# Patient Record
Sex: Female | Born: 1940 | Race: White | Hispanic: No | Smoking: Never smoker
Health system: Southern US, Community
[De-identification: ages and names within clinical notes are randomized; demographics above are authoritative.]

## PROBLEM LIST (undated history)

## (undated) DIAGNOSIS — C9000 Multiple myeloma not having achieved remission: Secondary | ICD-10-CM

## (undated) DIAGNOSIS — I739 Peripheral vascular disease, unspecified: Secondary | ICD-10-CM

## (undated) DIAGNOSIS — I219 Acute myocardial infarction, unspecified: Secondary | ICD-10-CM

## (undated) DIAGNOSIS — I10 Essential (primary) hypertension: Secondary | ICD-10-CM

## (undated) DIAGNOSIS — E7800 Pure hypercholesterolemia, unspecified: Secondary | ICD-10-CM

## (undated) DIAGNOSIS — I255 Ischemic cardiomyopathy: Secondary | ICD-10-CM

## (undated) DIAGNOSIS — R112 Nausea with vomiting, unspecified: Secondary | ICD-10-CM

## (undated) DIAGNOSIS — I509 Heart failure, unspecified: Secondary | ICD-10-CM

## (undated) DIAGNOSIS — E119 Type 2 diabetes mellitus without complications: Secondary | ICD-10-CM

## (undated) DIAGNOSIS — I2510 Atherosclerotic heart disease of native coronary artery without angina pectoris: Secondary | ICD-10-CM

## (undated) DIAGNOSIS — I639 Cerebral infarction, unspecified: Secondary | ICD-10-CM

## (undated) DIAGNOSIS — F419 Anxiety disorder, unspecified: Secondary | ICD-10-CM

## (undated) DIAGNOSIS — G8929 Other chronic pain: Secondary | ICD-10-CM

## (undated) DIAGNOSIS — Z9289 Personal history of other medical treatment: Secondary | ICD-10-CM

## (undated) DIAGNOSIS — M1990 Unspecified osteoarthritis, unspecified site: Secondary | ICD-10-CM

## (undated) DIAGNOSIS — I209 Angina pectoris, unspecified: Secondary | ICD-10-CM

## (undated) HISTORY — PX: CARPAL TUNNEL RELEASE: SHX101

## (undated) HISTORY — PX: CORONARY ANGIOPLASTY WITH STENT PLACEMENT: SHX49

## (undated) HISTORY — PX: VAGINAL HYSTERECTOMY: SUR661

## (undated) HISTORY — PX: RENAL ARTERY STENT: SHX2321

## (undated) HISTORY — PX: DILATION AND CURETTAGE OF UTERUS: SHX78

---

## 2005-01-11 HISTORY — PX: LAPAROSCOPIC GASTRIC BANDING: SHX1100

## 2005-04-05 ENCOUNTER — Other Ambulatory Visit: Payer: Self-pay

## 2005-04-05 ENCOUNTER — Inpatient Hospital Stay: Payer: Self-pay | Admitting: Internal Medicine

## 2005-08-11 ENCOUNTER — Ambulatory Visit (HOSPITAL_BASED_OUTPATIENT_CLINIC_OR_DEPARTMENT_OTHER): Admission: RE | Admit: 2005-08-11 | Discharge: 2005-08-11 | Payer: Self-pay | Admitting: Orthopedic Surgery

## 2006-06-27 ENCOUNTER — Ambulatory Visit: Payer: Self-pay

## 2009-12-08 ENCOUNTER — Ambulatory Visit: Payer: Self-pay | Admitting: Family Medicine

## 2010-03-05 ENCOUNTER — Encounter (HOSPITAL_BASED_OUTPATIENT_CLINIC_OR_DEPARTMENT_OTHER)
Admission: RE | Admit: 2010-03-05 | Discharge: 2010-03-05 | Disposition: A | Discharge status: Home / Self Care | Payer: Medicare Other | Source: Ambulatory Visit | Attending: Orthopedic Surgery | Admitting: Orthopedic Surgery

## 2010-03-05 DIAGNOSIS — Z01812 Encounter for preprocedural laboratory examination: Secondary | ICD-10-CM | POA: Insufficient documentation

## 2010-03-05 DIAGNOSIS — Z01810 Encounter for preprocedural cardiovascular examination: Secondary | ICD-10-CM | POA: Insufficient documentation

## 2010-03-05 LAB — POCT I-STAT, CHEM 8
BUN: 27 mg/dL — ABNORMAL HIGH (ref 6–23)
Chloride: 106 mEq/L (ref 96–112)
Creatinine, Ser: 1 mg/dL (ref 0.4–1.2)
Potassium: 4.9 mEq/L (ref 3.5–5.1)
Sodium: 135 mEq/L (ref 135–145)
TCO2: 22 mmol/L (ref 0–100)

## 2010-03-10 ENCOUNTER — Ambulatory Visit (HOSPITAL_BASED_OUTPATIENT_CLINIC_OR_DEPARTMENT_OTHER)
Admission: RE | Admit: 2010-03-10 | Discharge: 2010-03-10 | Disposition: A | Discharge status: Home / Self Care | Payer: Medicare Other | Source: Ambulatory Visit | Attending: Orthopedic Surgery | Admitting: Orthopedic Surgery

## 2010-03-10 DIAGNOSIS — G5600 Carpal tunnel syndrome, unspecified upper limb: Secondary | ICD-10-CM | POA: Insufficient documentation

## 2010-03-10 DIAGNOSIS — I10 Essential (primary) hypertension: Secondary | ICD-10-CM | POA: Insufficient documentation

## 2010-03-10 DIAGNOSIS — E119 Type 2 diabetes mellitus without complications: Secondary | ICD-10-CM | POA: Insufficient documentation

## 2010-03-10 DIAGNOSIS — M654 Radial styloid tenosynovitis [de Quervain]: Secondary | ICD-10-CM | POA: Insufficient documentation

## 2010-03-10 DIAGNOSIS — K219 Gastro-esophageal reflux disease without esophagitis: Secondary | ICD-10-CM | POA: Insufficient documentation

## 2010-03-10 LAB — GLUCOSE, CAPILLARY
Glucose-Capillary: 151 mg/dL — ABNORMAL HIGH (ref 70–99)
Glucose-Capillary: 202 mg/dL — ABNORMAL HIGH (ref 70–99)

## 2010-03-10 LAB — POCT HEMOGLOBIN-HEMACUE: Hemoglobin: 11.5 g/dL — ABNORMAL LOW (ref 12.0–15.0)

## 2010-07-31 NOTE — Op Note (Signed)
NAME:  [name], [name]                  ACCOUNT NO.:  [id number]  MEDICAL RECORD NO.:  [id number]           PATIENT TYPE:  LOCATION:                                 FACILITY:  PHYSICIAN:  Cindee Salt, M.D.            DATE OF BIRTH:  DATE OF PROCEDURE:  03/10/2010 DATE OF DISCHARGE:                              OPERATIVE REPORT   PREOPERATIVE DIAGNOSES:  Carpal tunnel syndrome, left hand; de Quervain tendonitis, left wrist.  POSTOPERATIVE DIAGNOSIS:  Carpal tunnel syndrome, left hand; de Quervain tendonitis, left wrist.  OPERATION:  Release of first dorsal compartment, left wrist; carpal tunnel release, left hand.  SURGEON:  Cindee Salt, MD  ANESTHESIA:  Axillary block.  ANESTHESIOLOGIST:  Dr. Jean Rosenthal.  HISTORY:  The patient is a 70 year old female with a history of carpal tunnel syndrome, de Quervain tendonitis neither of which have responded to conservative treatment.  She also has carpometacarpal arthritis.  She has elected not to have anything done to that at the present time.  Pre, peri, and postoperative course have been discussed along with risks and complications.  She is aware that there is no guarantee with surgery, possibility of infection, recurrence of injury to arteries, nerves, tendons, incomplete relief of symptoms, dystrophy.  Preoperative area of the patient is seen.  The extremity marked by both the patient and surgeon.  Antibiotic given.  PROCEDURE:  The patient is brought to the operating room where an axillary block was carried out without difficulty.  She was prepped using ChloraPrep in supine position, left arm free.  A 3-minute dry time was allowed.  Time-out taken to confirm the patient and procedure.  The limb was exsanguinated with an Esmarch bandage.  Tourniquet placed high on the arm was inflated to 250 mmHg.  A straight incision was made over the first dorsal compartment of her left wrist, carried down through subcutaneous tissue.  Bleeders were  electrocauterized with bipolar. Radial and ulnar nerve identified and protected.  Retractors placed. The first dorsal compartment was identified.  This was then incised on its most dorsal aspect.  The extensor brevis, pollicis brevis, abductor pollicis longus were each identified.  There was not a separate tunnel; however, there was a significant tenosynovitis with adhesions proximally on the abductor pollicis longus.  These were relieved.  No further lesions were identified.  The wound was irrigated and the skin closed with a subcuticular 5-0 Vicryl Rapide sutures.  Separate incision was then made longitudinally in the palm, carried down through subcutaneous tissue.  Bleeders were again electrocauterized with bipolar.  The palmar fascia split, superficial palmar arch identified.  The flexor tendon to the ring and little finger identified to the ulnar side of the median nerve.  The carpal retinaculum was incised with sharp dissection.  Right angle and Sewall retractor were placed between skin and forearm fascia. The fascia released for approximately a centimeter and half proximal to the wrist crease under direct vision.  Canal was explored.  Area of compression to the nerve was apparent.  No further lesions were identified.  The wound was  irrigated.  Skin closed with interrupted 5-0 Vicryl Rapide.  The surgery began while block was still somewhat immature.  Local infiltration with 0.25% Marcaine without epinephrine was given in each of the areas to be incised.  A sterile compressive dressing and splint was applied with fingers free, the thumb included. On deflation of the tourniquet, all fingers immediately pinked.  She was taken to the recovery room for observation in satisfactory condition. She will be discharged to home to return to the [facility] [place] in 1 week, on [address].          ______________________________ Cindee Salt, M.D.     GK/MEDQ  D:  03/10/2010  T:   03/10/2010  Job:  [id number]  Electronically Signed by Cindee Salt M.D. on 07/31/2010 09:00:03 AM

## 2010-11-25 ENCOUNTER — Ambulatory Visit: Payer: Self-pay | Admitting: Family Medicine

## 2011-01-12 HISTORY — PX: FEMORAL-POPLITEAL BYPASS GRAFT: SHX937

## 2011-03-30 DIAGNOSIS — I10 Essential (primary) hypertension: Secondary | ICD-10-CM | POA: Insufficient documentation

## 2011-03-30 DIAGNOSIS — E119 Type 2 diabetes mellitus without complications: Secondary | ICD-10-CM | POA: Insufficient documentation

## 2011-03-30 DIAGNOSIS — E785 Hyperlipidemia, unspecified: Secondary | ICD-10-CM | POA: Insufficient documentation

## 2011-04-26 DIAGNOSIS — K219 Gastro-esophageal reflux disease without esophagitis: Secondary | ICD-10-CM | POA: Insufficient documentation

## 2011-04-26 DIAGNOSIS — I2510 Atherosclerotic heart disease of native coronary artery without angina pectoris: Secondary | ICD-10-CM | POA: Insufficient documentation

## 2011-04-27 ENCOUNTER — Ambulatory Visit: Payer: Self-pay | Admitting: Psychiatry

## 2011-04-27 LAB — PLATELET FUNCTION ASSAY
COL/ADP PLT FXN SCRN: 97 Seconds (ref 0–100)
COL/EPI PLT FXN SCRN: >300 Seconds — ABNORMAL HIGH

## 2011-05-12 ENCOUNTER — Ambulatory Visit: Payer: Self-pay | Admitting: Family Medicine

## 2011-07-27 DIAGNOSIS — I739 Peripheral vascular disease, unspecified: Secondary | ICD-10-CM | POA: Insufficient documentation

## 2012-08-27 ENCOUNTER — Ambulatory Visit: Payer: Self-pay | Admitting: Emergency Medicine

## 2012-08-29 LAB — BETA STREP CULTURE(ARMC)

## 2013-05-15 DIAGNOSIS — I998 Other disorder of circulatory system: Secondary | ICD-10-CM | POA: Insufficient documentation

## 2013-05-15 DIAGNOSIS — I70229 Atherosclerosis of native arteries of extremities with rest pain, unspecified extremity: Secondary | ICD-10-CM | POA: Insufficient documentation

## 2013-07-16 DIAGNOSIS — C9000 Multiple myeloma not having achieved remission: Secondary | ICD-10-CM | POA: Insufficient documentation

## 2013-10-10 DIAGNOSIS — Z5111 Encounter for antineoplastic chemotherapy: Secondary | ICD-10-CM | POA: Insufficient documentation

## 2013-10-10 DIAGNOSIS — G629 Polyneuropathy, unspecified: Secondary | ICD-10-CM | POA: Insufficient documentation

## 2013-11-07 DIAGNOSIS — Z9884 Bariatric surgery status: Secondary | ICD-10-CM | POA: Insufficient documentation

## 2013-11-12 ENCOUNTER — Ambulatory Visit: Payer: Self-pay | Admitting: Oncology

## 2013-12-11 ENCOUNTER — Ambulatory Visit: Payer: Self-pay | Admitting: Oncology

## 2014-03-26 DIAGNOSIS — N390 Urinary tract infection, site not specified: Secondary | ICD-10-CM | POA: Insufficient documentation

## 2014-04-26 ENCOUNTER — Ambulatory Visit
Admit: 2014-04-26 | Disposition: A | Discharge status: Home / Self Care | Payer: Self-pay | Attending: Family Medicine | Admitting: Family Medicine

## 2015-04-08 ENCOUNTER — Other Ambulatory Visit: Payer: Self-pay | Admitting: Family Medicine

## 2015-04-08 ENCOUNTER — Ambulatory Visit
Admission: RE | Admit: 2015-04-08 | Discharge: 2015-04-08 | Disposition: A | Discharge status: Home / Self Care | Payer: Medicare Other | Source: Ambulatory Visit | Attending: Family Medicine | Admitting: Family Medicine

## 2015-04-08 DIAGNOSIS — M79621 Pain in right upper arm: Secondary | ICD-10-CM | POA: Diagnosis present

## 2015-04-08 DIAGNOSIS — M85821 Other specified disorders of bone density and structure, right upper arm: Secondary | ICD-10-CM | POA: Insufficient documentation

## 2015-05-12 ENCOUNTER — Other Ambulatory Visit: Payer: Self-pay | Admitting: Family Medicine

## 2015-05-12 ENCOUNTER — Ambulatory Visit
Admission: RE | Admit: 2015-05-12 | Discharge: 2015-05-12 | Disposition: A | Discharge status: Home / Self Care | Payer: Medicare Other | Source: Ambulatory Visit | Attending: Family Medicine | Admitting: Family Medicine

## 2015-05-12 DIAGNOSIS — M25562 Pain in left knee: Secondary | ICD-10-CM

## 2015-05-12 HISTORY — PX: CORONARY ANGIOPLASTY WITH STENT PLACEMENT: SHX49

## 2015-05-20 DIAGNOSIS — T07XXXA Unspecified multiple injuries, initial encounter: Secondary | ICD-10-CM | POA: Insufficient documentation

## 2015-05-20 DIAGNOSIS — M79605 Pain in left leg: Secondary | ICD-10-CM | POA: Insufficient documentation

## 2015-06-11 DIAGNOSIS — N179 Acute kidney failure, unspecified: Secondary | ICD-10-CM | POA: Insufficient documentation

## 2015-06-11 DIAGNOSIS — E875 Hyperkalemia: Secondary | ICD-10-CM | POA: Insufficient documentation

## 2015-06-14 DIAGNOSIS — M25562 Pain in left knee: Secondary | ICD-10-CM | POA: Insufficient documentation

## 2015-06-16 DIAGNOSIS — R600 Localized edema: Secondary | ICD-10-CM | POA: Insufficient documentation

## 2015-06-17 ENCOUNTER — Encounter
Admission: RE | Admit: 2015-06-17 | Discharge: 2015-06-17 | Disposition: A | Discharge status: Home / Self Care | Payer: Medicare Other | Source: Ambulatory Visit | Attending: Internal Medicine | Admitting: Internal Medicine

## 2015-06-18 LAB — GLUCOSE, CAPILLARY
Glucose-Capillary: 171 mg/dL — ABNORMAL HIGH (ref 65–99)
Glucose-Capillary: 144 mg/dL — ABNORMAL HIGH (ref 65–99)
Glucose-Capillary: 149 mg/dL — ABNORMAL HIGH (ref 65–99)

## 2015-06-19 LAB — GLUCOSE, CAPILLARY
GLUCOSE-CAPILLARY: 99 mg/dL (ref 65–99)
GLUCOSE-CAPILLARY: 137 mg/dL — AB (ref 65–99)
GLUCOSE-CAPILLARY: 269 mg/dL — AB (ref 65–99)
Glucose-Capillary: 250 mg/dL — ABNORMAL HIGH (ref 65–99)

## 2015-06-20 LAB — GLUCOSE, CAPILLARY
GLUCOSE-CAPILLARY: 201 mg/dL — AB (ref 65–99)
Glucose-Capillary: 120 mg/dL — ABNORMAL HIGH (ref 65–99)
Glucose-Capillary: 168 mg/dL — ABNORMAL HIGH (ref 65–99)
Glucose-Capillary: 185 mg/dL — ABNORMAL HIGH (ref 65–99)

## 2015-06-21 LAB — GLUCOSE, CAPILLARY
GLUCOSE-CAPILLARY: 128 mg/dL — AB (ref 65–99)
GLUCOSE-CAPILLARY: 149 mg/dL — AB (ref 65–99)
Glucose-Capillary: 148 mg/dL — ABNORMAL HIGH (ref 65–99)
Glucose-Capillary: 118 mg/dL — ABNORMAL HIGH (ref 65–99)

## 2015-06-22 LAB — GLUCOSE, CAPILLARY
GLUCOSE-CAPILLARY: 122 mg/dL — AB (ref 65–99)
Glucose-Capillary: 216 mg/dL — ABNORMAL HIGH (ref 65–99)
Glucose-Capillary: 256 mg/dL — ABNORMAL HIGH (ref 65–99)
Glucose-Capillary: 236 mg/dL — ABNORMAL HIGH (ref 65–99)

## 2015-06-23 LAB — GLUCOSE, CAPILLARY
GLUCOSE-CAPILLARY: 121 mg/dL — AB (ref 65–99)
GLUCOSE-CAPILLARY: 99 mg/dL (ref 65–99)
GLUCOSE-CAPILLARY: 72 mg/dL (ref 65–99)
Glucose-Capillary: 196 mg/dL — ABNORMAL HIGH (ref 65–99)
Glucose-Capillary: 62 mg/dL — ABNORMAL LOW (ref 65–99)
Glucose-Capillary: 114 mg/dL — ABNORMAL HIGH (ref 65–99)

## 2015-06-24 LAB — GLUCOSE, CAPILLARY
GLUCOSE-CAPILLARY: 104 mg/dL — AB (ref 65–99)
GLUCOSE-CAPILLARY: 132 mg/dL — AB (ref 65–99)
Glucose-Capillary: 123 mg/dL — ABNORMAL HIGH (ref 65–99)
Glucose-Capillary: 142 mg/dL — ABNORMAL HIGH (ref 65–99)

## 2015-06-25 LAB — GLUCOSE, CAPILLARY
GLUCOSE-CAPILLARY: 112 mg/dL — AB (ref 65–99)
GLUCOSE-CAPILLARY: 109 mg/dL — AB (ref 65–99)
Glucose-Capillary: 93 mg/dL (ref 65–99)

## 2015-06-26 LAB — GLUCOSE, CAPILLARY
GLUCOSE-CAPILLARY: 57 mg/dL — AB (ref 65–99)
GLUCOSE-CAPILLARY: 97 mg/dL (ref 65–99)
GLUCOSE-CAPILLARY: 72 mg/dL (ref 65–99)
Glucose-Capillary: 65 mg/dL (ref 65–99)
Glucose-Capillary: 75 mg/dL (ref 65–99)

## 2015-06-27 LAB — GLUCOSE, CAPILLARY
GLUCOSE-CAPILLARY: 281 mg/dL — AB (ref 65–99)
Glucose-Capillary: 59 mg/dL — ABNORMAL LOW (ref 65–99)
Glucose-Capillary: 85 mg/dL (ref 65–99)
Glucose-Capillary: 227 mg/dL — ABNORMAL HIGH (ref 65–99)

## 2015-06-28 LAB — GLUCOSE, CAPILLARY
GLUCOSE-CAPILLARY: 134 mg/dL — AB (ref 65–99)
GLUCOSE-CAPILLARY: 161 mg/dL — AB (ref 65–99)
Glucose-Capillary: 104 mg/dL — ABNORMAL HIGH (ref 65–99)
Glucose-Capillary: 99 mg/dL (ref 65–99)

## 2015-06-29 LAB — GLUCOSE, CAPILLARY
GLUCOSE-CAPILLARY: 103 mg/dL — AB (ref 65–99)
GLUCOSE-CAPILLARY: 150 mg/dL — AB (ref 65–99)
Glucose-Capillary: 120 mg/dL — ABNORMAL HIGH (ref 65–99)
Glucose-Capillary: 146 mg/dL — ABNORMAL HIGH (ref 65–99)

## 2015-06-30 LAB — GLUCOSE, CAPILLARY
GLUCOSE-CAPILLARY: 154 mg/dL — AB (ref 65–99)
GLUCOSE-CAPILLARY: 138 mg/dL — AB (ref 65–99)
Glucose-Capillary: 95 mg/dL (ref 65–99)

## 2015-07-01 LAB — GLUCOSE, CAPILLARY
Glucose-Capillary: 105 mg/dL — ABNORMAL HIGH (ref 65–99)
Glucose-Capillary: 168 mg/dL — ABNORMAL HIGH (ref 65–99)
Glucose-Capillary: 156 mg/dL — ABNORMAL HIGH (ref 65–99)

## 2015-07-02 LAB — GLUCOSE, CAPILLARY
GLUCOSE-CAPILLARY: 121 mg/dL — AB (ref 65–99)
GLUCOSE-CAPILLARY: 153 mg/dL — AB (ref 65–99)
Glucose-Capillary: 163 mg/dL — ABNORMAL HIGH (ref 65–99)
Glucose-Capillary: 76 mg/dL (ref 65–99)

## 2015-07-03 LAB — GLUCOSE, CAPILLARY
GLUCOSE-CAPILLARY: 97 mg/dL (ref 65–99)
Glucose-Capillary: 125 mg/dL — ABNORMAL HIGH (ref 65–99)
Glucose-Capillary: 133 mg/dL — ABNORMAL HIGH (ref 65–99)
Glucose-Capillary: 145 mg/dL — ABNORMAL HIGH (ref 65–99)

## 2015-07-04 LAB — GLUCOSE, CAPILLARY
GLUCOSE-CAPILLARY: 111 mg/dL — AB (ref 65–99)
GLUCOSE-CAPILLARY: 124 mg/dL — AB (ref 65–99)
Glucose-Capillary: 75 mg/dL (ref 65–99)
Glucose-Capillary: 148 mg/dL — ABNORMAL HIGH (ref 65–99)

## 2015-07-05 LAB — GLUCOSE, CAPILLARY
GLUCOSE-CAPILLARY: 128 mg/dL — AB (ref 65–99)
Glucose-Capillary: 94 mg/dL (ref 65–99)
Glucose-Capillary: 63 mg/dL — ABNORMAL LOW (ref 65–99)
Glucose-Capillary: 100 mg/dL — ABNORMAL HIGH (ref 65–99)

## 2015-07-06 LAB — GLUCOSE, CAPILLARY
GLUCOSE-CAPILLARY: 93 mg/dL (ref 65–99)
GLUCOSE-CAPILLARY: 103 mg/dL — AB (ref 65–99)
GLUCOSE-CAPILLARY: 137 mg/dL — AB (ref 65–99)
GLUCOSE-CAPILLARY: 180 mg/dL — AB (ref 65–99)

## 2015-07-07 LAB — GLUCOSE, CAPILLARY
Glucose-Capillary: 127 mg/dL — ABNORMAL HIGH (ref 65–99)
Glucose-Capillary: 235 mg/dL — ABNORMAL HIGH (ref 65–99)
Glucose-Capillary: 186 mg/dL — ABNORMAL HIGH (ref 65–99)

## 2015-07-08 LAB — GLUCOSE, CAPILLARY
GLUCOSE-CAPILLARY: 226 mg/dL — AB (ref 65–99)
Glucose-Capillary: 123 mg/dL — ABNORMAL HIGH (ref 65–99)
Glucose-Capillary: 170 mg/dL — ABNORMAL HIGH (ref 65–99)
Glucose-Capillary: 214 mg/dL — ABNORMAL HIGH (ref 65–99)

## 2015-07-09 LAB — GLUCOSE, CAPILLARY
GLUCOSE-CAPILLARY: 167 mg/dL — AB (ref 65–99)
Glucose-Capillary: 139 mg/dL — ABNORMAL HIGH (ref 65–99)
Glucose-Capillary: 163 mg/dL — ABNORMAL HIGH (ref 65–99)
Glucose-Capillary: 192 mg/dL — ABNORMAL HIGH (ref 65–99)

## 2015-07-10 LAB — GLUCOSE, CAPILLARY
GLUCOSE-CAPILLARY: 134 mg/dL — AB (ref 65–99)
GLUCOSE-CAPILLARY: 198 mg/dL — AB (ref 65–99)
Glucose-Capillary: 143 mg/dL — ABNORMAL HIGH (ref 65–99)
Glucose-Capillary: 146 mg/dL — ABNORMAL HIGH (ref 65–99)

## 2015-07-11 LAB — GLUCOSE, CAPILLARY
GLUCOSE-CAPILLARY: 130 mg/dL — AB (ref 65–99)
GLUCOSE-CAPILLARY: 157 mg/dL — AB (ref 65–99)
GLUCOSE-CAPILLARY: 142 mg/dL — AB (ref 65–99)
GLUCOSE-CAPILLARY: 208 mg/dL — AB (ref 65–99)

## 2015-07-12 ENCOUNTER — Encounter
Admission: RE | Admit: 2015-07-12 | Discharge: 2015-07-12 | Disposition: A | Discharge status: Home / Self Care | Payer: Medicare Other | Source: Ambulatory Visit | Attending: Internal Medicine | Admitting: Internal Medicine

## 2015-07-12 LAB — GLUCOSE, CAPILLARY
GLUCOSE-CAPILLARY: 168 mg/dL — AB (ref 65–99)
GLUCOSE-CAPILLARY: 162 mg/dL — AB (ref 65–99)
Glucose-Capillary: 148 mg/dL — ABNORMAL HIGH (ref 65–99)
Glucose-Capillary: 233 mg/dL — ABNORMAL HIGH (ref 65–99)

## 2015-07-13 LAB — GLUCOSE, CAPILLARY
GLUCOSE-CAPILLARY: 145 mg/dL — AB (ref 65–99)
GLUCOSE-CAPILLARY: 210 mg/dL — AB (ref 65–99)
Glucose-Capillary: 147 mg/dL — ABNORMAL HIGH (ref 65–99)
Glucose-Capillary: 197 mg/dL — ABNORMAL HIGH (ref 65–99)

## 2015-07-14 LAB — GLUCOSE, CAPILLARY
Glucose-Capillary: 148 mg/dL — ABNORMAL HIGH (ref 65–99)
Glucose-Capillary: 148 mg/dL — ABNORMAL HIGH (ref 65–99)
Glucose-Capillary: 236 mg/dL — ABNORMAL HIGH (ref 65–99)

## 2015-07-15 LAB — GLUCOSE, CAPILLARY
GLUCOSE-CAPILLARY: 138 mg/dL — AB (ref 65–99)
GLUCOSE-CAPILLARY: 154 mg/dL — AB (ref 65–99)
GLUCOSE-CAPILLARY: 153 mg/dL — AB (ref 65–99)
GLUCOSE-CAPILLARY: 169 mg/dL — AB (ref 65–99)

## 2015-07-16 LAB — GLUCOSE, CAPILLARY
GLUCOSE-CAPILLARY: 152 mg/dL — AB (ref 65–99)
Glucose-Capillary: 143 mg/dL — ABNORMAL HIGH (ref 65–99)

## 2015-07-26 ENCOUNTER — Emergency Department: Payer: Medicare Other

## 2015-07-26 ENCOUNTER — Encounter: Payer: Self-pay | Admitting: Emergency Medicine

## 2015-07-26 ENCOUNTER — Emergency Department
Admission: EM | Admit: 2015-07-26 | Discharge: 2015-07-26 | Disposition: A | Discharge status: Home / Self Care | Payer: Medicare Other | Attending: Emergency Medicine | Admitting: Emergency Medicine

## 2015-07-26 DIAGNOSIS — E119 Type 2 diabetes mellitus without complications: Secondary | ICD-10-CM | POA: Insufficient documentation

## 2015-07-26 DIAGNOSIS — S0003XA Contusion of scalp, initial encounter: Secondary | ICD-10-CM | POA: Insufficient documentation

## 2015-07-26 DIAGNOSIS — S43401A Unspecified sprain of right shoulder joint, initial encounter: Secondary | ICD-10-CM | POA: Insufficient documentation

## 2015-07-26 DIAGNOSIS — Z7984 Long term (current) use of oral hypoglycemic drugs: Secondary | ICD-10-CM | POA: Diagnosis not present

## 2015-07-26 DIAGNOSIS — S0990XA Unspecified injury of head, initial encounter: Secondary | ICD-10-CM

## 2015-07-26 DIAGNOSIS — Y9289 Other specified places as the place of occurrence of the external cause: Secondary | ICD-10-CM | POA: Insufficient documentation

## 2015-07-26 DIAGNOSIS — W1809XA Striking against other object with subsequent fall, initial encounter: Secondary | ICD-10-CM | POA: Diagnosis not present

## 2015-07-26 DIAGNOSIS — I252 Old myocardial infarction: Secondary | ICD-10-CM | POA: Diagnosis not present

## 2015-07-26 DIAGNOSIS — Z79899 Other long term (current) drug therapy: Secondary | ICD-10-CM | POA: Insufficient documentation

## 2015-07-26 DIAGNOSIS — Y999 Unspecified external cause status: Secondary | ICD-10-CM | POA: Insufficient documentation

## 2015-07-26 DIAGNOSIS — Y9389 Activity, other specified: Secondary | ICD-10-CM | POA: Diagnosis not present

## 2015-07-26 DIAGNOSIS — T148XXA Other injury of unspecified body region, initial encounter: Secondary | ICD-10-CM

## 2015-07-26 DIAGNOSIS — Z7982 Long term (current) use of aspirin: Secondary | ICD-10-CM | POA: Insufficient documentation

## 2015-07-26 HISTORY — DX: Multiple myeloma not having achieved remission: C90.00

## 2015-07-26 HISTORY — DX: Acute myocardial infarction, unspecified: I21.9

## 2015-07-26 LAB — CBC WITH DIFFERENTIAL/PLATELET
BASOS PCT: 1 %
Basophils Absolute: 0.1 10*3/uL (ref 0–0.1)
EOS ABS: 0.2 10*3/uL (ref 0–0.7)
EOS PCT: 2 %
HCT: 35.4 % (ref 35.0–47.0)
HEMOGLOBIN: 11.6 g/dL — AB (ref 12.0–16.0)
LYMPHS ABS: 5.6 10*3/uL — AB (ref 1.0–3.6)
Lymphocytes Relative: 47 %
MCH: 26.6 pg (ref 26.0–34.0)
MCHC: 32.8 g/dL (ref 32.0–36.0)
MCV: 81.1 fL (ref 80.0–100.0)
MONOS PCT: 4 %
Monocytes Absolute: 0.5 10*3/uL (ref 0.2–0.9)
NEUTROS PCT: 46 %
Neutro Abs: 5.5 10*3/uL (ref 1.4–6.5)
PLATELETS: 240 10*3/uL (ref 150–440)
RBC: 4.37 MIL/uL (ref 3.80–5.20)
RDW: 16.7 % — AB (ref 11.5–14.5)
WBC: 12 10*3/uL — ABNORMAL HIGH (ref 3.6–11.0)

## 2015-07-26 LAB — BASIC METABOLIC PANEL
ANION GAP: 11 (ref 5–15)
BUN: 22 mg/dL — AB (ref 6–20)
CALCIUM: 9.8 mg/dL (ref 8.9–10.3)
CO2: 24 mmol/L (ref 22–32)
CREATININE: 1.24 mg/dL — AB (ref 0.44–1.00)
Chloride: 99 mmol/L — ABNORMAL LOW (ref 101–111)
GFR calc Af Amer: 48 mL/min — ABNORMAL LOW (ref >60)
GFR, EST NON AFRICAN AMERICAN: 42 mL/min — AB (ref >60)
GLUCOSE: 212 mg/dL — AB (ref 65–99)
Potassium: 4.5 mmol/L (ref 3.5–5.1)
Sodium: 134 mmol/L — ABNORMAL LOW (ref 135–145)

## 2015-07-26 LAB — PROTIME-INR
INR: 0.97
PROTHROMBIN TIME: 13.1 s (ref 11.4–15.0)
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
## 2015-08-12 DIAGNOSIS — I219 Acute myocardial infarction, unspecified: Secondary | ICD-10-CM

## 2015-08-12 HISTORY — DX: Acute myocardial infarction, unspecified: I21.9

## 2015-11-05 DIAGNOSIS — N390 Urinary tract infection, site not specified: Secondary | ICD-10-CM | POA: Insufficient documentation

## 2015-11-11 DIAGNOSIS — Z951 Presence of aortocoronary bypass graft: Secondary | ICD-10-CM | POA: Insufficient documentation

## 2015-11-11 HISTORY — PX: CORONARY ARTERY BYPASS GRAFT: SHX141

## 2015-11-12 DIAGNOSIS — G8929 Other chronic pain: Secondary | ICD-10-CM | POA: Insufficient documentation

## 2015-11-12 DIAGNOSIS — G8918 Other acute postprocedural pain: Secondary | ICD-10-CM | POA: Insufficient documentation

## 2015-11-13 DIAGNOSIS — D62 Acute posthemorrhagic anemia: Secondary | ICD-10-CM | POA: Insufficient documentation

## 2015-11-13 DIAGNOSIS — D72829 Elevated white blood cell count, unspecified: Secondary | ICD-10-CM | POA: Insufficient documentation

## 2015-11-29 ENCOUNTER — Emergency Department: Payer: Medicare Other

## 2015-11-29 ENCOUNTER — Emergency Department
Admission: EM | Admit: 2015-11-29 | Discharge: 2015-11-29 | Disposition: A | Discharge status: Home / Self Care | Payer: Medicare Other | Attending: Emergency Medicine | Admitting: Emergency Medicine

## 2015-11-29 ENCOUNTER — Encounter: Payer: Self-pay | Admitting: Emergency Medicine

## 2015-11-29 DIAGNOSIS — I252 Old myocardial infarction: Secondary | ICD-10-CM | POA: Diagnosis not present

## 2015-11-29 DIAGNOSIS — Z7982 Long term (current) use of aspirin: Secondary | ICD-10-CM | POA: Diagnosis not present

## 2015-11-29 DIAGNOSIS — Z7984 Long term (current) use of oral hypoglycemic drugs: Secondary | ICD-10-CM | POA: Diagnosis not present

## 2015-11-29 DIAGNOSIS — I10 Essential (primary) hypertension: Secondary | ICD-10-CM | POA: Insufficient documentation

## 2015-11-29 DIAGNOSIS — Z951 Presence of aortocoronary bypass graft: Secondary | ICD-10-CM | POA: Diagnosis not present

## 2015-11-29 DIAGNOSIS — Z79899 Other long term (current) drug therapy: Secondary | ICD-10-CM | POA: Diagnosis not present

## 2015-11-29 DIAGNOSIS — J189 Pneumonia, unspecified organism: Secondary | ICD-10-CM | POA: Diagnosis not present

## 2015-11-29 DIAGNOSIS — E119 Type 2 diabetes mellitus without complications: Secondary | ICD-10-CM | POA: Insufficient documentation

## 2015-11-29 DIAGNOSIS — R0602 Shortness of breath: Secondary | ICD-10-CM | POA: Diagnosis present

## 2015-11-29 DIAGNOSIS — R0789 Other chest pain: Secondary | ICD-10-CM

## 2015-11-29 HISTORY — DX: Essential (primary) hypertension: I10

## 2015-11-29 LAB — BASIC METABOLIC PANEL
ANION GAP: 8 (ref 5–15)
BUN: 23 mg/dL — ABNORMAL HIGH (ref 6–20)
CHLORIDE: 103 mmol/L (ref 101–111)
CO2: 23 mmol/L (ref 22–32)
Calcium: 8.9 mg/dL (ref 8.9–10.3)
Creatinine, Ser: 1.1 mg/dL — ABNORMAL HIGH (ref 0.44–1.00)
GFR calc non Af Amer: 48 mL/min — ABNORMAL LOW (ref >60)
GFR, EST AFRICAN AMERICAN: 56 mL/min — AB (ref >60)
GLUCOSE: 166 mg/dL — AB (ref 65–99)
Potassium: 4.3 mmol/L (ref 3.5–5.1)
Sodium: 134 mmol/L — ABNORMAL LOW (ref 135–145)

## 2015-11-29 LAB — TROPONIN I: Troponin I: <0.03 ng/mL (ref <0.03)

## 2015-11-29 MED ORDER — LEVOFLOXACIN 500 MG PO TABS: 500.0000 mg | ORAL_TABLET | Freq: Every day | ORAL | 0 refills | Status: AC

## 2015-11-29 MED ORDER — LEVOFLOXACIN 750 MG PO TABS
750.0000 mg | ORAL_TABLET | Freq: Once | ORAL | Status: AC
  Administered 2015-11-29: 750 mg via ORAL
  Filled 2015-11-29: qty 1

## 2015-11-29 NOTE — ED Notes (Signed)
Pt back from xr and ambulated to the toilet

## 2015-11-29 NOTE — ED Notes (Signed)
Pt upset that she has been "stuck five times" for iv. (these were ultrasound iv attempts d/t pt needing a size 20 iv for rule out pe study). Blood was obtained by Ena Dawley with his attempt but per lab the purple top needs redrawn. Pt does not want [place] to attempt another iv. Pt is on a blood thinner - dr made aware.

## 2015-11-29 NOTE — ED Provider Notes (Signed)
[facility] Emergency Department Provider Note   ____________________________________________   I have reviewed the triage vital signs and the nursing notes.   HISTORY  Chief Complaint Shortness of Breath; Chest Pain; and Nausea   History limited by: Not Limited   HPI [name] is a 75 y.o. female, who underwent CABG at [place] 18 days ago, who presents to the emergency department today because of concern for shortness of breath and chest pain. The shortness of breath started this morning and has gotten progressively worse. She actually felt like cold air when she went outside made it better. She did feel some right sided chest pain with this shortness of breath. No fevers. No cough.     Past Medical History:  Diagnosis Date  . Diabetes mellitus without complication ([place])   . Hypertension   . MI (myocardial infarction)   . Multiple myeloma ([facility])     There are no active problems to display for this patient.   Past Surgical History:  Procedure Laterality Date  . ABDOMINAL HYSTERECTOMY    . CARDIAC SURGERY    . CORONARY ARTERY BYPASS GRAFT    . LAPAROSCOPIC GASTRIC BANDING      Prior to Admission medications   Medication Sig Start Date End Date Taking? Authorizing Provider  aspirin 81 MG tablet Take 81 mg by mouth daily.    Historical Provider, MD  Cholecalciferol (VITAMIN D3) 50000 units CAPS Take 1 capsule by mouth 2 (two) times a week.    Historical Provider, MD  clopidogrel (PLAVIX) 75 MG tablet Take 75 mg by mouth daily.    Historical Provider, MD  ezetimibe (ZETIA) 10 MG tablet Take 10 mg by mouth daily.    Historical Provider, MD  fentaNYL (DURAGESIC - DOSED MCG/HR) 25 MCG/HR patch Place 25 mcg onto the skin every 3 (three) days.    Historical Provider, MD  gabapentin (NEURONTIN) 300 MG capsule Take 300 mg by mouth at bedtime.    Historical Provider, MD  HYDROmorphone (DILAUDID) 2 MG tablet Take 2 mg by mouth every 4 (four)  hours as needed for severe pain.    Historical Provider, MD  isosorbide mononitrate (IMDUR) 30 MG 24 hr tablet Take 30 mg by mouth daily.    Historical Provider, MD  lisinopril (PRINIVIL,ZESTRIL) 5 MG tablet Take 5 mg by mouth daily.    Historical Provider, MD  metFORMIN (GLUCOPHAGE) 500 MG tablet Take by mouth 2 (two) times daily with a meal.    Historical Provider, MD  metoprolol (LOPRESSOR) 50 MG tablet Take 50 mg by mouth daily.    Historical Provider, MD  nitroGLYCERIN (NITROSTAT) 0.4 MG SL tablet Place 0.4 mg under the tongue every 5 (five) minutes as needed for chest pain.    Historical Provider, MD  ondansetron (ZOFRAN-ODT) 4 MG disintegrating tablet Take 4 mg by mouth every 8 (eight) hours as needed for nausea or vomiting.    Historical Provider, MD    Allergies Amoxicillin; Codeine; and Statins  History reviewed. No pertinent family history.  Social History Social History  Substance Use Topics  . Smoking status: Never Smoker  . Smokeless tobacco: Never Used  . Alcohol use No    Review of Systems  Constitutional: Negative for fever. Cardiovascular: Positive for right sided chest pain. Respiratory: Positive for shortness of breath. Gastrointestinal: Negative for abdominal pain, vomiting and diarrhea. Genitourinary: Negative for dysuria. Musculoskeletal: Negative for back pain. Skin: Negative for rash. Neurological: Negative for headaches, focal weakness or numbness.  10-point ROS otherwise negative.  ____________________________________________   PHYSICAL EXAM:  VITAL SIGNS: ED Triage Vitals  Enc Vitals Group     BP 11/29/15 1909 (!) 192/73     Pulse Rate 11/29/15 1909 72     Resp 11/29/15 1909 20     Temp 11/29/15 1909 98.2 F (36.8 C)     Temp Source 11/29/15 1909 Oral     SpO2 11/29/15 1909 99 %     Weight 11/29/15 1910 155 lb (70.3 kg)     Height 11/29/15 1910 _0  (1.575 m)     Head Circumference --      Peak Flow --      Pain Score 11/29/15 1910 5    Constitutional: Alert and oriented. Well appearing and in no distress. Eyes: Conjunctivae are normal. Normal extraocular movements. ENT   Head: Normocephalic and atraumatic.   Nose: No congestion/rhinnorhea.   Mouth/Throat: Mucous membranes are moist.   Neck: No stridor. Hematological/Lymphatic/Immunilogical: No cervical lymphadenopathy. Cardiovascular: Normal rate, regular rhythm.  No murmurs, rubs, or gallops.  Respiratory: Normal respiratory effort without tachypnea nor retractions. Breath sounds are clear and equal bilaterally. No wheezes/rales/rhonchi. Gastrointestinal: Soft and nontender. No distention.  Genitourinary: Deferred Musculoskeletal: Normal range of motion in all extremities. No lower extremity edema. Neurologic:  Normal speech and language. No gross focal neurologic deficits are appreciated.  Skin:  Skin is warm, dry and intact. No rash noted. Psychiatric: Mood and affect are normal. Speech and behavior are normal. Patient exhibits appropriate insight and judgment.  ____________________________________________    LABS (pertinent positives/negatives)  Labs Reviewed  BASIC METABOLIC PANEL - Abnormal; Notable for the following:       Result Value   Sodium 134 (*)    Glucose, Bld 166 (*)    BUN 23 (*)    Creatinine, Ser 1.10 (*)    GFR calc non Af Amer 48 (*)    GFR calc Af Amer 56 (*)    All other components within normal limits  TROPONIN I     ____________________________________________   EKG  I, Nance Pear, attending physician, personally viewed and interpreted this EKG  EKG Time: 1903 Rate: 72 Rhythm: normal sinus rhythm Axis: left axis deviation Intervals: qtc 442 QRS: narrow, q waves III, aVF, V1, V2, V3 ST changes: no st elevation Impression: abnormal ekg   ____________________________________________    RADIOLOGY  CXR pending  CT PE  pending  _________________   PROCEDURES  Procedures  ____________________________________________   INITIAL IMPRESSION / ASSESSMENT AND PLAN / ED COURSE  Pertinent labs & imaging results that were available during my care of the patient were reviewed by me and considered in my medical decision making (see chart for details).  Patient with chest pain, shortness of breath who underwent cabg roughly 18 days ago. On exam patient appears well, lungs are clear. Given recent operation would have concern for possible PE. Would also consider PNA. Think ACS less likely given location of pain. No acute findings on EKG. Will check blood work, CXR and CTA PE.  Clinical Course    Staff unable to obtain an IV, with US guidance. Had a discussion with patient about further attempts. Did discuss with patient that it would be of great use to be able to obtain a CT PE. Patient would like to discuss further with her family. Awaiting CXR at time of signout.  ____________________________________________   FINAL CLINICAL IMPRESSION(S) / ED DIAGNOSES  Shortness of breath  Note: This dictation was prepared  with Sales executive. Any transcriptional errors that result from this process are unintentional    Nance Pear, MD 11/30/15 1101

## 2015-11-29 NOTE — ED Notes (Signed)
Attempt at IV access by ED paramedic Russell unsuccessful; pt tolerated well

## 2015-11-29 NOTE — ED Notes (Signed)
Pt states she normally has to have ultrasound iv's. Pt will need a #20 iv for r/o pe study. Larene Beach rn will see pt for ultrasound iv.

## 2015-11-29 NOTE — ED Provider Notes (Signed)
Patient received in signout from Dr. Archie Balboa. Patient being evaluated for chest pain status post CABG associated with some shortness of breath. EKG and troponin not consistent with ACS. Chest x-ray suggestive of pneumonia. Plan was to have a CT PE study performed a evaluate for any pulmonary embolism. Multiple attempts for IV access were obtained and the patient is refusing further evaluation and management. Chest x-ray does show evidence of effusion and probable pneumonia. Patient is requesting discharge home. She understands that leaving prior to completion of her workup could result and failure to diagnose life-threatening processes.  I will discharge the patient with oral Levaquin to treat pneumonia and is encouraged patient to return for further evaluation and management.   Merlyn Lot, MD 11/29/15 2222

## 2015-11-29 NOTE — ED Notes (Signed)
Aaron Edelman rn with pt for attempt [place] iv. Pt stating the the pain of him trying to insert the iv is "going to give her a heart attack". Aaron Edelman stopped the iv attempt.

## 2015-11-29 NOTE — ED Notes (Signed)
Pt requested that I attempt an iv not using ultrasound - unable to establish the iv. [name] in room to assist. She allowed him to attempt as well with no success. Dr. aware.

## 2015-11-29 NOTE — ED Notes (Signed)
Pt states she is having some right sided chest pain and feeling sob even when sitting. Pt had open heart surgery on 10/31 at [facility] and states she had 2 blockages. Vss. bp was high on arrival but decreased when relaxing in room.

## 2015-11-29 NOTE — ED Triage Notes (Signed)
Pt reports open heart surgery at [facility] on 11/11/15; pt says this am she woke with nausea, shortness of breath and right sided chest pain; pt reports sharp pain to right breast area; pain is worse with deep inspiration; denies cough and fever; pt says shortness of breath is when she's sitting up; says she can go walk outside in the cold air and breathe better; pt's skin is dry but pale; alert and oriented x 3; talking in complete coherent sentences; pt adds if she has to be transferred anywhere she does not want to go back to [facility]; talking about this made her tearful in triage

## 2015-12-15 ENCOUNTER — Encounter: Payer: Medicare Other | Attending: Internal Medicine | Admitting: *Deleted

## 2015-12-15 VITALS — Ht 62.1 in | Wt 166.0 lb

## 2015-12-15 DIAGNOSIS — I779 Disorder of arteries and arterioles, unspecified: Secondary | ICD-10-CM | POA: Insufficient documentation

## 2015-12-15 DIAGNOSIS — Z9889 Other specified postprocedural states: Secondary | ICD-10-CM | POA: Diagnosis present

## 2015-12-15 DIAGNOSIS — Z48812 Encounter for surgical aftercare following surgery on the circulatory system: Secondary | ICD-10-CM | POA: Diagnosis present

## 2015-12-15 DIAGNOSIS — Z951 Presence of aortocoronary bypass graft: Secondary | ICD-10-CM | POA: Insufficient documentation

## 2015-12-15 NOTE — Progress Notes (Signed)
Cardiac Individual Treatment Plan  Patient Details  Name: [name] MRN: [id number] Date of Birth: [birth year] Referring Provider:   Flowsheet Row Cardiac Rehab from 12/15/2015 in [facility] Cardiac and Pulmonary Rehab  Referring Provider  Harle Stanford MD      Initial Encounter Date:  Flowsheet Row Cardiac Rehab from 12/15/2015 in [facility] Cardiac and Pulmonary Rehab  Date  12/15/15  Referring Provider  Harle Stanford MD      Visit Diagnosis: S/P CABG x 2  Patient's Home Medications on Admission:  Current Outpatient Prescriptions:  .  aspirin 81 MG tablet, Take 81 mg by mouth daily., Disp: , Rfl:  .  Cholecalciferol (VITAMIN D3) 50000 units CAPS, Take 1 capsule by mouth 2 (two) times a week., Disp: , Rfl:  .  clopidogrel (PLAVIX) 75 MG tablet, Take 75 mg by mouth daily., Disp: , Rfl:  .  ezetimibe (ZETIA) 10 MG tablet, Take 10 mg by mouth daily., Disp: , Rfl:  .  fentaNYL (DURAGESIC - DOSED MCG/HR) 25 MCG/HR patch, Place 25 mcg onto the skin every 3 (three) days., Disp: , Rfl:  .  gabapentin (NEURONTIN) 300 MG capsule, Take 300 mg by mouth at bedtime., Disp: , Rfl:  .  HYDROmorphone (DILAUDID) 2 MG tablet, Take 2 mg by mouth every 4 (four) hours as needed for severe pain., Disp: , Rfl:  .  metFORMIN (GLUCOPHAGE) 500 MG tablet, Take by mouth 2 (two) times daily with a meal., Disp: , Rfl:  .  metoprolol (LOPRESSOR) 50 MG tablet, Take 50 mg by mouth daily., Disp: , Rfl:  .  ondansetron (ZOFRAN-ODT) 4 MG disintegrating tablet, Take 4 mg by mouth every 8 (eight) hours as needed for nausea or vomiting., Disp: , Rfl:  .  isosorbide mononitrate (IMDUR) 30 MG 24 hr tablet, Take 30 mg by mouth daily., Disp: , Rfl:  .  lisinopril (PRINIVIL,ZESTRIL) 5 MG tablet, Take 5 mg by mouth daily., Disp: , Rfl:  .  nitroGLYCERIN (NITROSTAT) 0.4 MG SL tablet, Place 0.4 mg under the tongue every 5 (five) minutes as needed for chest pain., Disp: , Rfl:   Past Medical History: Past Medical History:   Diagnosis Date  . Diabetes mellitus without complication ([place])   . Hypertension   . MI (myocardial infarction)   . Multiple myeloma ([facility])     Tobacco Use: History  Smoking Status  . Never Smoker  Smokeless Tobacco  . Never Used    Labs: Recent Review Flowsheet Data    Labs for ITP Cardiac and Pulmonary Rehab Latest Ref Rng & Units 03/05/2010   TCO2 0 - 100 mmol/L 22       Exercise Target Goals: Date: 12/15/15  Exercise Program Goal: Individual exercise prescription set with THRR, safety & activity barriers. Participant demonstrates ability to understand and report RPE using BORG scale, to self-measure pulse accurately, and to acknowledge the importance of the exercise prescription.  Exercise Prescription Goal: Starting with aerobic activity 30 plus minutes a day, 3 days per week for initial exercise prescription. Provide home exercise prescription and guidelines that participant acknowledges understanding prior to discharge.  Activity Barriers & Risk Stratification:     Activity Barriers & Cardiac Risk Stratification - 12/15/15 1432      Activity Barriers & Cardiac Risk Stratification   Activity Barriers Arthritis  Right shoulder,left knee arthritis very painful. myeloma causing pain in legs sometimes sever   Cardiac Risk Stratification High      6 Minute Walk:  6 Minute Walk    Row Name 12/15/15 1504         6 Minute Walk   Phase Initial     Distance 500 feet     Walk Time 6 minutes     # of Rest Breaks 0     MPH 0.95     METS 1.7     RPE 13     Perceived Dyspnea  3     VO2 Peak 3.72     Symptoms Yes (comment)     Comments SOB, knee pain 6/10     Resting HR 58 bpm     Resting BP 130/66     Max Ex. HR 102 bpm     Max Ex. BP 130/64     2 Minute Post BP 124/56        Initial Exercise Prescription:     Initial Exercise Prescription - 12/15/15 1500      Date of Initial Exercise RX and Referring Provider   Date 12/15/15   Referring  Provider Harle Stanford MD     NuStep   Level 1   Watts --  60-80 spm   Minutes 15   METs 1.7     Arm Ergometer   Level 1   Watts --  25-35 rpm   Minutes 15   METs 1.7     Track   Laps 15   Minutes 15   METs 1.7     Prescription Details   Frequency (times per week) 2   Duration Progress to 45 minutes of aerobic exercise without signs/symptoms of physical distress     Intensity   THRR 40-80% of Max Heartrate 93-128   Ratings of Perceived Exertion 11-13   Perceived Dyspnea 0-4     Progression   Progression Continue to progress workloads to maintain intensity without signs/symptoms of physical distress.     Resistance Training   Training Prescription Yes   Weight 1 lbs   Reps 10-12      Perform Capillary Blood Glucose checks as needed.  Exercise Prescription Changes:     Exercise Prescription Changes    Row Name 12/15/15 1500             Exercise Review   Progression -  walk test results         Response to Exercise   Blood Pressure (Admit) 130/66       Blood Pressure (Exercise) 130/64       Blood Pressure (Exit) 124/56       Heart Rate (Admit) 58 bpm       Heart Rate (Exercise) 91 bpm       Heart Rate (Exit) 57 bpm       Oxygen Saturation (Admit) 93 %       Oxygen Saturation (Exercise) 100 %       Rating of Perceived Exertion (Exercise) 13       Symptoms SOB, knee pain 6/10          Exercise Comments:     Exercise Comments    Row Name 12/15/15 1508           Exercise Comments Biggest exercise goal is to increase her stamina to get back to doing things and able to go out with her friends.          Discharge Exercise Prescription (Final Exercise Prescription Changes):     Exercise Prescription Changes - 12/15/15 1500      Exercise  Review   Progression --  walk test results     Response to Exercise   Blood Pressure (Admit) 130/66   Blood Pressure (Exercise) 130/64   Blood Pressure (Exit) 124/56   Heart Rate (Admit) 58 bpm    Heart Rate (Exercise) 91 bpm   Heart Rate (Exit) 57 bpm   Oxygen Saturation (Admit) 93 %   Oxygen Saturation (Exercise) 100 %   Rating of Perceived Exertion (Exercise) 13   Symptoms SOB, knee pain 6/10      Nutrition:  Target Goals: Understanding of nutrition guidelines, daily intake of sodium <1570m, cholesterol <2034m calories 30% from fat and 7% or less from saturated fats, daily to have 5 or more servings of fruits and vegetables.  Biometrics:     Pre Biometrics - 12/15/15 1509      Pre Biometrics   Height 5' 2.1" (1.577 m)   Weight 166 lb (75.3 kg)   Waist Circumference 35 inches   Hip Circumference 44.5 inches   Waist to Hip Ratio 0.79 %   BMI (Calculated) 30.3   Single Leg Stand 0 seconds       Nutrition Therapy Plan and Nutrition Goals:     Nutrition Therapy & Goals - 12/15/15 1420      Intervention Plan   Intervention Prescribe, educate and counsel regarding individualized specific dietary modifications aiming towards targeted core components such as weight, hypertension, lipid management, diabetes, heart failure and other comorbidities.   Expected Outcomes Short Term Goal: Understand basic principles of dietary content, such as calories, fat, sodium, cholesterol and nutrients.;Short Term Goal: A plan has been developed with personal nutrition goals set during dietitian appointment.;Long Term Goal: Adherence to prescribed nutrition plan.      Nutrition Discharge: Rate Your Plate Scores:     Nutrition Assessments - 12/15/15 1420      Rate Your Plate Scores   Pre Score 60   Pre Score % 66.6 %      Nutrition Goals Re-Evaluation:   Psychosocial: Target Goals: Acknowledge presence or absence of depression, maximize coping skills, provide positive support system. Participant is able to verbalize types and ability to use techniques and skills needed for reducing stress and depression.  Initial Review & Psychosocial Screening:     Initial Psych Review &  Screening - 12/15/15 1425      Initial Review   Current issues with Current Sleep Concerns  Sleep concerns since surgery     Family Dynamics   Good Support System? --  Son,daughter and friends     Barriers   Psychosocial barriers to participate in program There are no identifiable barriers or psychosocial needs.;The patient should benefit from training in stress management and relaxation.     Screening Interventions   Interventions Encouraged to exercise      Quality of Life Scores:     Quality of Life - 12/15/15 1428      Quality of Life Scores   Health/Function Pre 13.53 %   Socioeconomic Pre 18.6 %   Psych/Spiritual Pre 21 %   Family Pre 15.3 %   GLOBAL Pre 16.08 %      PHQ-9: Recent Review Flowsheet Data    [facility]   Decreased Interest 0   Down, Depressed, Hopeless 0   PHQ - 2 Score 0   Altered sleeping 3    Tired, decreased energy 2    Change in appetite 0   Feeling bad or failure about yourself  0  Trouble concentrating 0   Moving slowly or fidgety/restless 0   Suicidal thoughts 0   PHQ-9 Score 5      Psychosocial Evaluation and Intervention:   Psychosocial Re-Evaluation:   Vocational Rehabilitation: Provide vocational rehab assistance to qualifying candidates.   Vocational Rehab Evaluation & Intervention:     Vocational Rehab - 12/15/15 1443      Initial Vocational Rehab Evaluation & Intervention   Assessment shows need for Vocational Rehabilitation No      Education: Education Goals: Education classes will be provided on a weekly basis, covering required topics. Participant will state understanding/return demonstration of topics presented.  Learning Barriers/Preferences:     Learning Barriers/Preferences - 12/15/15 1441      Learning Barriers/Preferences   Learning Barriers None   Learning Preferences Verbal Instruction      Education Topics: General Nutrition Guidelines/Fats and Fiber: -Group  instruction provided by verbal, written material, models and posters to present the general guidelines for heart healthy nutrition. Gives an explanation and review of dietary fats and fiber.   Controlling Sodium/Reading Food Labels: -Group verbal and written material supporting the discussion of sodium use in heart healthy nutrition. Review and explanation with models, verbal and written materials for utilization of the food label.   Exercise Physiology & Risk Factors: - Group verbal and written instruction with models to review the exercise physiology of the cardiovascular system and associated critical values. Details cardiovascular disease risk factors and the goals associated with each risk factor.   Aerobic Exercise & Resistance Training: - Gives group verbal and written discussion on the health impact of inactivity. On the components of aerobic and resistive training programs and the benefits of this training and how to safely progress through these programs.   Flexibility, Balance, General Exercise Guidelines: - Provides group verbal and written instruction on the benefits of flexibility and balance training programs. Provides general exercise guidelines with specific guidelines to those with heart or lung disease. Demonstration and skill practice provided.   Stress Management: - Provides group verbal and written instruction about the health risks of elevated stress, cause of high stress, and healthy ways to reduce stress.   Depression: - Provides group verbal and written instruction on the correlation between heart/lung disease and depressed mood, treatment options, and the stigmas associated with seeking treatment.   Anatomy & Physiology of the Heart: - Group verbal and written instruction and models provide basic cardiac anatomy and physiology, with the coronary electrical and arterial systems. Review of: AMI, Angina, Valve disease, Heart Failure, Cardiac Arrhythmia, Pacemakers,  and the ICD.   Cardiac Procedures: - Group verbal and written instruction and models to describe the testing methods done to diagnose heart disease. Reviews the outcomes of the test results. Describes the treatment choices: Medical Management, Angioplasty, or Coronary Bypass Surgery.   Cardiac Medications: - Group verbal and written instruction to review commonly prescribed medications for heart disease. Reviews the medication, class of the drug, and side effects. Includes the steps to properly store meds and maintain the prescription regimen.   Go Sex-Intimacy & Heart Disease, Get SMART - Goal Setting: - Group verbal and written instruction through game format to discuss heart disease and the return to sexual intimacy. Provides group verbal and written material to discuss and apply goal setting through the application of the S.M.A.R.T. Method.   Other Matters of the Heart: - Provides group verbal, written materials and models to describe Heart Failure, Angina, Valve Disease, and Diabetes in the realm of  heart disease. Includes description of the disease process and treatment options available to the cardiac patient.   Exercise & Equipment Safety: - Individual verbal instruction and demonstration of equipment use and safety with use of the equipment. Flowsheet Row Cardiac Rehab from 12/15/2015 in [facility] Cardiac and Pulmonary Rehab  Date  12/15/15  Educator  SB  Instruction Review Code  2- meets goals/outcomes      Infection Prevention: - Provides verbal and written material to individual with discussion of infection control including proper hand washing and proper equipment cleaning during exercise session. Flowsheet Row Cardiac Rehab from 12/15/2015 in [facility] Cardiac and Pulmonary Rehab  Date  12/15/15  Educator  SB  Instruction Review Code  2- meets goals/outcomes      Falls Prevention: - Provides verbal and written material to individual with discussion of falls prevention and  safety. Flowsheet Row Cardiac Rehab from 12/15/2015 in [facility] Cardiac and Pulmonary Rehab  Date  12/15/15  Educator  SB  Instruction Review Code  2- meets goals/outcomes      Diabetes: - Individual verbal and written instruction to review signs/symptoms of diabetes, desired ranges of glucose level fasting, after meals and with exercise. Advice that pre and post exercise glucose checks will be done for 3 sessions at entry of program. Flowsheet Row Cardiac Rehab from 12/15/2015 in [facility] Cardiac and Pulmonary Rehab  Date  12/15/15  Educator  SB  Instruction Review Code  2- meets goals/outcomes       Knowledge Questionnaire Score:     Knowledge Questionnaire Score - 12/15/15 1442      Knowledge Questionnaire Score   Pre Score 18/28      Core Components/Risk Factors/Patient Goals at Admission:     Personal Goals and Risk Factors at Admission - 12/15/15 1443      Core Components/Risk Factors/Patient Goals on Admission    Weight Management Obesity;Yes   Intervention Weight Management: Develop a combined nutrition and exercise program designed to reach desired caloric intake, while maintaining appropriate intake of nutrient and fiber, sodium and fats, and appropriate energy expenditure required for the weight goal.;Weight Management: Provide education and appropriate resources to help participant work on and attain dietary goals.;Weight Management/Obesity: Establish reasonable short term and long term weight goals.   Admit Weight 166 lb (75.3 kg)   Goal Weight: Short Term 163 lb (73.9 kg)   Goal Weight: Long Term 140 lb (63.5 kg)   Expected Outcomes Short Term: Continue to assess and modify interventions until short term weight is achieved;Long Term: Adherence to nutrition and physical activity/exercise program aimed toward attainment of established weight goal;Weight Loss: Understanding of general recommendations for a balanced deficit meal plan, which promotes 1-2 lb weight loss per week  and includes a negative energy balance of [phone] kcal/d   Sedentary Yes   Intervention Develop an individualized exercise prescription for aerobic and resistive training based on initial evaluation findings, risk stratification, comorbidities and participant's personal goals.;Provide advice, education, support and counseling about physical activity/exercise needs.   Expected Outcomes Achievement of increased cardiorespiratory fitness and enhanced flexibility, muscular endurance and strength shown through measurements of functional capacity and personal statement of participant.   Increase Strength and Stamina Yes   Intervention Provide advice, education, support and counseling about physical activity/exercise needs.;Develop an individualized exercise prescription for aerobic and resistive training based on initial evaluation findings, risk stratification, comorbidities and participant's personal goals.   Expected Outcomes Achievement of increased cardiorespiratory fitness and enhanced flexibility, muscular endurance and strength shown  through measurements of functional capacity and personal statement of participant.   Diabetes Yes   Intervention Provide education about signs/symptoms and action to take for hypo/hyperglycemia.;Provide education about proper nutrition, including hydration, and aerobic/resistive exercise prescription along with prescribed medications to achieve blood glucose in normal ranges: Fasting glucose 65-99 mg/dL   Expected Outcomes Short Term: Participant verbalizes understanding of the signs/symptoms and immediate care of hyper/hypoglycemia, proper foot care and importance of medication, aerobic/resistive exercise and nutrition plan for blood glucose control.;Long Term: Attainment of HbA1C < 7%.   Hypertension Yes   Intervention Provide education on lifestyle modifcations including regular physical activity/exercise, weight management, moderate sodium restriction and increased  consumption of fresh fruit, vegetables, and low fat dairy, alcohol moderation, and smoking cessation.;Monitor prescription use compliance.   Expected Outcomes Short Term: Continued assessment and intervention until BP is < 140/64m HG in hypertensive participants. < 130/836mHG in hypertensive participants with diabetes, heart failure or chronic kidney disease.;Long Term: Maintenance of blood pressure at goal levels.   Lipids Yes   Intervention Provide education and support for participant on nutrition & aerobic/resistive exercise along with prescribed medications to achieve LDL <7081mHDL >30m63m Expected Outcomes Short Term: Participant states understanding of desired cholesterol values and is compliant with medications prescribed. Participant is following exercise prescription and nutrition guidelines.;Long Term: Cholesterol controlled with medications as prescribed, with individualized exercise RX and with personalized nutrition plan. Value goals: LDL < 70mg20mL > 40 mg.      Core Components/Risk Factors/Patient Goals Review:    Core Components/Risk Factors/Patient Goals at Discharge (Final Review):    ITP Comments:     ITP Comments    Row Name 12/15/15 1416           ITP Comments Medical review completed with initial ITP created. Diagnosis documentation can be found Care Everywhere [facility] post op visit 11/27/2015          Comments: Initial ITP

## 2015-12-15 NOTE — Patient Instructions (Signed)
Patient Instructions  Patient Details  Name: [name] MRN: [id number] Date of Birth: 11/09/1940 Referring Provider:  Vergia Alcon  Below are the personal goals you chose as well as exercise and nutrition goals. Our goal is to help you keep on track towards obtaining and maintaining your goals. We will be discussing your progress on these goals with you throughout the program.  Initial Exercise Prescription:     Initial Exercise Prescription - 12/15/15 1500      Date of Initial Exercise RX and Referring Provider   Date 12/15/15   Referring Provider Harle Stanford MD     NuStep   Level 1   Watts --  60-80 spm   Minutes 15   METs 1.7     Arm Ergometer   Level 1   Watts --  25-35 rpm   Minutes 15   METs 1.7     Track   Laps 15   Minutes 15   METs 1.7     Prescription Details   Frequency (times per week) 2   Duration Progress to 45 minutes of aerobic exercise without signs/symptoms of physical distress     Intensity   THRR 40-80% of Max Heartrate 93-128   Ratings of Perceived Exertion 11-13   Perceived Dyspnea 0-4     Progression   Progression Continue to progress workloads to maintain intensity without signs/symptoms of physical distress.     Resistance Training   Training Prescription Yes   Weight 1 lbs   Reps 10-12      Exercise Goals: Frequency: Be able to perform aerobic exercise three times per week working toward 3-5 days per week.  Intensity: Work with a perceived exertion of 11 (fairly light) - 15 (hard) as tolerated. Follow your new exercise prescription and watch for changes in prescription as you progress with the program. Changes will be reviewed with you when they are made.  Duration: You should be able to do 30 minutes of continuous aerobic exercise in addition to a 5 minute warm-up and a 5 minute cool-down routine.  Nutrition Goals: Your personal nutrition goals will be established when you do your nutrition analysis with the  dietician.  The following are nutrition guidelines to follow: Cholesterol < 200mg /day Sodium < 1500mg /day Fiber: Women over 50 yrs - 21 grams per day  Personal Goals:     Personal Goals and Risk Factors at Admission - 12/15/15 1443      Core Components/Risk Factors/Patient Goals on Admission    Weight Management Obesity;Yes   Intervention Weight Management: Develop a combined nutrition and exercise program designed to reach desired caloric intake, while maintaining appropriate intake of nutrient and fiber, sodium and fats, and appropriate energy expenditure required for the weight goal.;Weight Management: Provide education and appropriate resources to help participant work on and attain dietary goals.;Weight Management/Obesity: Establish reasonable short term and long term weight goals.   Admit Weight 166 lb (75.3 kg)   Goal Weight: Short Term 163 lb (73.9 kg)   Goal Weight: Long Term 140 lb (63.5 kg)   Expected Outcomes Short Term: Continue to assess and modify interventions until short term weight is achieved;Long Term: Adherence to nutrition and physical activity/exercise program aimed toward attainment of established weight goal;Weight Loss: Understanding of general recommendations for a balanced deficit meal plan, which promotes 1-2 lb weight loss per week and includes a negative energy balance of [phone] kcal/d   Sedentary Yes   Intervention Develop an individualized exercise prescription for  aerobic and resistive training based on initial evaluation findings, risk stratification, comorbidities and participant's personal goals.;Provide advice, education, support and counseling about physical activity/exercise needs.   Expected Outcomes Achievement of increased cardiorespiratory fitness and enhanced flexibility, muscular endurance and strength shown through measurements of functional capacity and personal statement of participant.   Increase Strength and Stamina Yes   Intervention  Provide advice, education, support and counseling about physical activity/exercise needs.;Develop an individualized exercise prescription for aerobic and resistive training based on initial evaluation findings, risk stratification, comorbidities and participant's personal goals.   Expected Outcomes Achievement of increased cardiorespiratory fitness and enhanced flexibility, muscular endurance and strength shown through measurements of functional capacity and personal statement of participant.   Diabetes Yes   Intervention Provide education about signs/symptoms and action to take for hypo/hyperglycemia.;Provide education about proper nutrition, including hydration, and aerobic/resistive exercise prescription along with prescribed medications to achieve blood glucose in normal ranges: Fasting glucose 65-99 mg/dL   Expected Outcomes Short Term: Participant verbalizes understanding of the signs/symptoms and immediate care of hyper/hypoglycemia, proper foot care and importance of medication, aerobic/resistive exercise and nutrition plan for blood glucose control.;Long Term: Attainment of HbA1C < 7%.   Hypertension Yes   Intervention Provide education on lifestyle modifcations including regular physical activity/exercise, weight management, moderate sodium restriction and increased consumption of fresh fruit, vegetables, and low fat dairy, alcohol moderation, and smoking cessation.;Monitor prescription use compliance.   Expected Outcomes Short Term: Continued assessment and intervention until BP is < 140/30mm HG in hypertensive participants. < 130/63mm HG in hypertensive participants with diabetes, heart failure or chronic kidney disease.;Long Term: Maintenance of blood pressure at goal levels.   Lipids Yes   Intervention Provide education and support for participant on nutrition & aerobic/resistive exercise along with prescribed medications to achieve LDL 70mg , HDL >40mg .   Expected Outcomes Short Term:  Participant states understanding of desired cholesterol values and is compliant with medications prescribed. Participant is following exercise prescription and nutrition guidelines.;Long Term: Cholesterol controlled with medications as prescribed, with individualized exercise RX and with personalized nutrition plan. Value goals: LDL < 70mg , HDL > 40 mg.      Tobacco Use Initial Evaluation: History  Smoking Status  . Never Smoker  Smokeless Tobacco  . Never Used    Copy of goals given to participant.

## 2015-12-17 ENCOUNTER — Encounter: Payer: Self-pay | Admitting: *Deleted

## 2015-12-17 DIAGNOSIS — Z951 Presence of aortocoronary bypass graft: Secondary | ICD-10-CM

## 2015-12-17 NOTE — Progress Notes (Signed)
Cardiac Individual Treatment Plan  Patient Details  Name: [name] MRN: [id number] Date of Birth: 15-Mar-1940 Referring Provider:   Flowsheet Row Cardiac Rehab from 12/15/2015 in [facility] Cardiac and Pulmonary Rehab  Referring Provider  Harle Stanford MD      Initial Encounter Date:  Flowsheet Row Cardiac Rehab from 12/15/2015 in [facility] Cardiac and Pulmonary Rehab  Date  12/15/15  Referring Provider  Harle Stanford MD      Visit Diagnosis: S/P CABG x 2  Patient's Home Medications on Admission:  Current Outpatient Prescriptions:  .  aspirin 81 MG tablet, Take 81 mg by mouth daily., Disp: , Rfl:  .  Cholecalciferol (VITAMIN D3) 50000 units CAPS, Take 1 capsule by mouth 2 (two) times a week., Disp: , Rfl:  .  clopidogrel (PLAVIX) 75 MG tablet, Take 75 mg by mouth daily., Disp: , Rfl:  .  ezetimibe (ZETIA) 10 MG tablet, Take 10 mg by mouth daily., Disp: , Rfl:  .  fentaNYL (DURAGESIC - DOSED MCG/HR) 25 MCG/HR patch, Place 25 mcg onto the skin every 3 (three) days., Disp: , Rfl:  .  gabapentin (NEURONTIN) 300 MG capsule, Take 300 mg by mouth at bedtime., Disp: , Rfl:  .  HYDROmorphone (DILAUDID) 2 MG tablet, Take 2 mg by mouth every 4 (four) hours as needed for severe pain., Disp: , Rfl:  .  isosorbide mononitrate (IMDUR) 30 MG 24 hr tablet, Take 30 mg by mouth daily., Disp: , Rfl:  .  lisinopril (PRINIVIL,ZESTRIL) 5 MG tablet, Take 5 mg by mouth daily., Disp: , Rfl:  .  metFORMIN (GLUCOPHAGE) 500 MG tablet, Take by mouth 2 (two) times daily with a meal., Disp: , Rfl:  .  metoprolol (LOPRESSOR) 50 MG tablet, Take 50 mg by mouth daily., Disp: , Rfl:  .  nitroGLYCERIN (NITROSTAT) 0.4 MG SL tablet, Place 0.4 mg under the tongue every 5 (five) minutes as needed for chest pain., Disp: , Rfl:  .  ondansetron (ZOFRAN-ODT) 4 MG disintegrating tablet, Take 4 mg by mouth every 8 (eight) hours as needed for nausea or vomiting., Disp: , Rfl:   Past Medical History: Past Medical History:   Diagnosis Date  . Diabetes mellitus without complication ([place])   . Hypertension   . MI (myocardial infarction)   . Multiple myeloma ([facility])     Tobacco Use: History  Smoking Status  . Never Smoker  Smokeless Tobacco  . Never Used    Labs: Recent Review Flowsheet Data    Labs for ITP Cardiac and Pulmonary Rehab Latest Ref Rng & Units 03/05/2010   TCO2 0 - 100 mmol/L 22       Exercise Target Goals:    Exercise Program Goal: Individual exercise prescription set with THRR, safety & activity barriers. Participant demonstrates ability to understand and report RPE using BORG scale, to self-measure pulse accurately, and to acknowledge the importance of the exercise prescription.  Exercise Prescription Goal: Starting with aerobic activity 30 plus minutes a day, 3 days per week for initial exercise prescription. Provide home exercise prescription and guidelines that participant acknowledges understanding prior to discharge.  Activity Barriers & Risk Stratification:     Activity Barriers & Cardiac Risk Stratification - 12/15/15 1432      Activity Barriers & Cardiac Risk Stratification   Activity Barriers Arthritis  Right shoulder,left knee arthritis very painful. myeloma causing pain in legs sometimes sever   Cardiac Risk Stratification High      6 Minute Walk:  6 Minute Walk    Row Name 12/15/15 1504         6 Minute Walk   Phase Initial     Distance 500 feet     Walk Time 6 minutes     # of Rest Breaks 0     MPH 0.95     METS 1.7     RPE 13     Perceived Dyspnea  3     VO2 Peak 3.72     Symptoms Yes (comment)     Comments SOB, knee pain 6/10     Resting HR 58 bpm     Resting BP 130/66     Max Ex. HR 102 bpm     Max Ex. BP 130/64     2 Minute Post BP 124/56        Initial Exercise Prescription:     Initial Exercise Prescription - 12/15/15 1500      Date of Initial Exercise RX and Referring Provider   Date 12/15/15   Referring Provider Harle Stanford MD     NuStep   Level 1   Watts --  60-80 spm   Minutes 15   METs 1.7     Arm Ergometer   Level 1   Watts --  25-35 rpm   Minutes 15   METs 1.7     Track   Laps 15   Minutes 15   METs 1.7     Prescription Details   Frequency (times per week) 2   Duration Progress to 45 minutes of aerobic exercise without signs/symptoms of physical distress     Intensity   THRR 40-80% of Max Heartrate 93-128   Ratings of Perceived Exertion 11-13   Perceived Dyspnea 0-4     Progression   Progression Continue to progress workloads to maintain intensity without signs/symptoms of physical distress.     Resistance Training   Training Prescription Yes   Weight 1 lbs   Reps 10-12      Perform Capillary Blood Glucose checks as needed.  Exercise Prescription Changes:     Exercise Prescription Changes    Row Name 12/15/15 1500             Exercise Review   Progression -  walk test results         Response to Exercise   Blood Pressure (Admit) 130/66       Blood Pressure (Exercise) 130/64       Blood Pressure (Exit) 124/56       Heart Rate (Admit) 58 bpm       Heart Rate (Exercise) 91 bpm       Heart Rate (Exit) 57 bpm       Oxygen Saturation (Admit) 93 %       Oxygen Saturation (Exercise) 100 %       Rating of Perceived Exertion (Exercise) 13       Symptoms SOB, knee pain 6/10          Exercise Comments:     Exercise Comments    Row Name 12/15/15 1508           Exercise Comments Biggest exercise goal is to increase her stamina to get back to doing things and able to go out with her friends.          Discharge Exercise Prescription (Final Exercise Prescription Changes):     Exercise Prescription Changes - 12/15/15 1500      Exercise  Review   Progression --  walk test results     Response to Exercise   Blood Pressure (Admit) 130/66   Blood Pressure (Exercise) 130/64   Blood Pressure (Exit) 124/56   Heart Rate (Admit) 58 bpm   Heart Rate  (Exercise) 91 bpm   Heart Rate (Exit) 57 bpm   Oxygen Saturation (Admit) 93 %   Oxygen Saturation (Exercise) 100 %   Rating of Perceived Exertion (Exercise) 13   Symptoms SOB, knee pain 6/10      Nutrition:  Target Goals: Understanding of nutrition guidelines, daily intake of sodium <1549m, cholesterol <2071m calories 30% from fat and 7% or less from saturated fats, daily to have 5 or more servings of fruits and vegetables.  Biometrics:     Pre Biometrics - 12/15/15 1509      Pre Biometrics   Height 5' 2.1" (1.577 m)   Weight 166 lb (75.3 kg)   Waist Circumference 35 inches   Hip Circumference 44.5 inches   Waist to Hip Ratio 0.79 %   BMI (Calculated) 30.3   Single Leg Stand 0 seconds       Nutrition Therapy Plan and Nutrition Goals:     Nutrition Therapy & Goals - 12/15/15 1420      Intervention Plan   Intervention Prescribe, educate and counsel regarding individualized specific dietary modifications aiming towards targeted core components such as weight, hypertension, lipid management, diabetes, heart failure and other comorbidities.   Expected Outcomes Short Term Goal: Understand basic principles of dietary content, such as calories, fat, sodium, cholesterol and nutrients.;Short Term Goal: A plan has been developed with personal nutrition goals set during dietitian appointment.;Long Term Goal: Adherence to prescribed nutrition plan.      Nutrition Discharge: Rate Your Plate Scores:     Nutrition Assessments - 12/15/15 1420      Rate Your Plate Scores   Pre Score 60   Pre Score % 66.6 %      Nutrition Goals Re-Evaluation:   Psychosocial: Target Goals: Acknowledge presence or absence of depression, maximize coping skills, provide positive support system. Participant is able to verbalize types and ability to use techniques and skills needed for reducing stress and depression.  Initial Review & Psychosocial Screening:     Initial Psych Review & Screening -  12/15/15 1425      Initial Review   Current issues with Current Sleep Concerns  Sleep concerns since surgery     Family Dynamics   Good Support System? --  Son,daughter and friends     Barriers   Psychosocial barriers to participate in program There are no identifiable barriers or psychosocial needs.;The patient should benefit from training in stress management and relaxation.     Screening Interventions   Interventions Encouraged to exercise      Quality of Life Scores:     Quality of Life - 12/15/15 1428      Quality of Life Scores   Health/Function Pre 13.53 %   Socioeconomic Pre 18.6 %   Psych/Spiritual Pre 21 %   Family Pre 15.3 %   GLOBAL Pre 16.08 %      PHQ-9: Recent Review Flowsheet Data    [facility]   Decreased Interest 0   Down, Depressed, Hopeless 0   PHQ - 2 Score 0   Altered sleeping 3    Tired, decreased energy 2    Change in appetite 0   Feeling bad or failure about yourself  0  Trouble concentrating 0   Moving slowly or fidgety/restless 0   Suicidal thoughts 0   PHQ-9 Score 5      Psychosocial Evaluation and Intervention:   Psychosocial Re-Evaluation:   Vocational Rehabilitation: Provide vocational rehab assistance to qualifying candidates.   Vocational Rehab Evaluation & Intervention:     Vocational Rehab - 12/15/15 1443      Initial Vocational Rehab Evaluation & Intervention   Assessment shows need for Vocational Rehabilitation No      Education: Education Goals: Education classes will be provided on a weekly basis, covering required topics. Participant will state understanding/return demonstration of topics presented.  Learning Barriers/Preferences:     Learning Barriers/Preferences - 12/15/15 1441      Learning Barriers/Preferences   Learning Barriers None   Learning Preferences Verbal Instruction      Education Topics: General Nutrition Guidelines/Fats and Fiber: -Group instruction  provided by verbal, written material, models and posters to present the general guidelines for heart healthy nutrition. Gives an explanation and review of dietary fats and fiber.   Controlling Sodium/Reading Food Labels: -Group verbal and written material supporting the discussion of sodium use in heart healthy nutrition. Review and explanation with models, verbal and written materials for utilization of the food label.   Exercise Physiology & Risk Factors: - Group verbal and written instruction with models to review the exercise physiology of the cardiovascular system and associated critical values. Details cardiovascular disease risk factors and the goals associated with each risk factor.   Aerobic Exercise & Resistance Training: - Gives group verbal and written discussion on the health impact of inactivity. On the components of aerobic and resistive training programs and the benefits of this training and how to safely progress through these programs.   Flexibility, Balance, General Exercise Guidelines: - Provides group verbal and written instruction on the benefits of flexibility and balance training programs. Provides general exercise guidelines with specific guidelines to those with heart or lung disease. Demonstration and skill practice provided.   Stress Management: - Provides group verbal and written instruction about the health risks of elevated stress, cause of high stress, and healthy ways to reduce stress.   Depression: - Provides group verbal and written instruction on the correlation between heart/lung disease and depressed mood, treatment options, and the stigmas associated with seeking treatment.   Anatomy & Physiology of the Heart: - Group verbal and written instruction and models provide basic cardiac anatomy and physiology, with the coronary electrical and arterial systems. Review of: AMI, Angina, Valve disease, Heart Failure, Cardiac Arrhythmia, Pacemakers, and the  ICD.   Cardiac Procedures: - Group verbal and written instruction and models to describe the testing methods done to diagnose heart disease. Reviews the outcomes of the test results. Describes the treatment choices: Medical Management, Angioplasty, or Coronary Bypass Surgery.   Cardiac Medications: - Group verbal and written instruction to review commonly prescribed medications for heart disease. Reviews the medication, class of the drug, and side effects. Includes the steps to properly store meds and maintain the prescription regimen.   Go Sex-Intimacy & Heart Disease, Get SMART - Goal Setting: - Group verbal and written instruction through game format to discuss heart disease and the return to sexual intimacy. Provides group verbal and written material to discuss and apply goal setting through the application of the S.M.A.R.T. Method.   Other Matters of the Heart: - Provides group verbal, written materials and models to describe Heart Failure, Angina, Valve Disease, and Diabetes in the realm of  heart disease. Includes description of the disease process and treatment options available to the cardiac patient.   Exercise & Equipment Safety: - Individual verbal instruction and demonstration of equipment use and safety with use of the equipment. Flowsheet Row Cardiac Rehab from 12/15/2015 in [facility] Cardiac and Pulmonary Rehab  Date  12/15/15  Educator  SB  Instruction Review Code  2- meets goals/outcomes      Infection Prevention: - Provides verbal and written material to individual with discussion of infection control including proper hand washing and proper equipment cleaning during exercise session. Flowsheet Row Cardiac Rehab from 12/15/2015 in [facility] Cardiac and Pulmonary Rehab  Date  12/15/15  Educator  SB  Instruction Review Code  2- meets goals/outcomes      Falls Prevention: - Provides verbal and written material to individual with discussion of falls prevention and  safety. Flowsheet Row Cardiac Rehab from 12/15/2015 in [facility] Cardiac and Pulmonary Rehab  Date  12/15/15  Educator  SB  Instruction Review Code  2- meets goals/outcomes      Diabetes: - Individual verbal and written instruction to review signs/symptoms of diabetes, desired ranges of glucose level fasting, after meals and with exercise. Advice that pre and post exercise glucose checks will be done for 3 sessions at entry of program. Flowsheet Row Cardiac Rehab from 12/15/2015 in [facility] Cardiac and Pulmonary Rehab  Date  12/15/15  Educator  SB  Instruction Review Code  2- meets goals/outcomes       Knowledge Questionnaire Score:     Knowledge Questionnaire Score - 12/15/15 1442      Knowledge Questionnaire Score   Pre Score 18/28      Core Components/Risk Factors/Patient Goals at Admission:     Personal Goals and Risk Factors at Admission - 12/15/15 1443      Core Components/Risk Factors/Patient Goals on Admission    Weight Management Obesity;Yes   Intervention Weight Management: Develop a combined nutrition and exercise program designed to reach desired caloric intake, while maintaining appropriate intake of nutrient and fiber, sodium and fats, and appropriate energy expenditure required for the weight goal.;Weight Management: Provide education and appropriate resources to help participant work on and attain dietary goals.;Weight Management/Obesity: Establish reasonable short term and long term weight goals.   Admit Weight 166 lb (75.3 kg)   Goal Weight: Short Term 163 lb (73.9 kg)   Goal Weight: Long Term 140 lb (63.5 kg)   Expected Outcomes Short Term: Continue to assess and modify interventions until short term weight is achieved;Long Term: Adherence to nutrition and physical activity/exercise program aimed toward attainment of established weight goal;Weight Loss: Understanding of general recommendations for a balanced deficit meal plan, which promotes 1-2 lb weight loss per week  and includes a negative energy balance of [phone] kcal/d   Sedentary Yes   Intervention Develop an individualized exercise prescription for aerobic and resistive training based on initial evaluation findings, risk stratification, comorbidities and participant's personal goals.;Provide advice, education, support and counseling about physical activity/exercise needs.   Expected Outcomes Achievement of increased cardiorespiratory fitness and enhanced flexibility, muscular endurance and strength shown through measurements of functional capacity and personal statement of participant.   Increase Strength and Stamina Yes   Intervention Provide advice, education, support and counseling about physical activity/exercise needs.;Develop an individualized exercise prescription for aerobic and resistive training based on initial evaluation findings, risk stratification, comorbidities and participant's personal goals.   Expected Outcomes Achievement of increased cardiorespiratory fitness and enhanced flexibility, muscular endurance and strength shown  through measurements of functional capacity and personal statement of participant.   Diabetes Yes   Intervention Provide education about signs/symptoms and action to take for hypo/hyperglycemia.;Provide education about proper nutrition, including hydration, and aerobic/resistive exercise prescription along with prescribed medications to achieve blood glucose in normal ranges: Fasting glucose 65-99 mg/dL   Expected Outcomes Short Term: Participant verbalizes understanding of the signs/symptoms and immediate care of hyper/hypoglycemia, proper foot care and importance of medication, aerobic/resistive exercise and nutrition plan for blood glucose control.;Long Term: Attainment of HbA1C < 7%.   Hypertension Yes   Intervention Provide education on lifestyle modifcations including regular physical activity/exercise, weight management, moderate sodium restriction and increased  consumption of fresh fruit, vegetables, and low fat dairy, alcohol moderation, and smoking cessation.;Monitor prescription use compliance.   Expected Outcomes Short Term: Continued assessment and intervention until BP is < 140/37m HG in hypertensive participants. < 130/849mHG in hypertensive participants with diabetes, heart failure or chronic kidney disease.;Long Term: Maintenance of blood pressure at goal levels.   Lipids Yes   Intervention Provide education and support for participant on nutrition & aerobic/resistive exercise along with prescribed medications to achieve LDL <7061mHDL >64m18m Expected Outcomes Short Term: Participant states understanding of desired cholesterol values and is compliant with medications prescribed. Participant is following exercise prescription and nutrition guidelines.;Long Term: Cholesterol controlled with medications as prescribed, with individualized exercise RX and with personalized nutrition plan. Value goals: LDL < 70mg53mL > 40 mg.      Core Components/Risk Factors/Patient Goals Review:    Core Components/Risk Factors/Patient Goals at Discharge (Final Review):    ITP Comments:     ITP Comments    Row Name 12/15/15 1416 12/17/15 0708         ITP Comments Medical review completed with initial ITP created. Diagnosis documentation can be found Care Everywhere [facility] post op visit 11/27/2015 30 day review completed for review by Dr Mark Emily Filbertntinue with ITP unless changes noted by Dr MIlleSabra Heckll  return after wound is closed.         Comments:

## 2015-12-24 ENCOUNTER — Encounter: Payer: Medicare Other | Admitting: Internal Medicine

## 2015-12-24 DIAGNOSIS — Z48812 Encounter for surgical aftercare following surgery on the circulatory system: Secondary | ICD-10-CM | POA: Diagnosis not present

## 2015-12-25 ENCOUNTER — Other Ambulatory Visit
Admission: RE | Admit: 2015-12-25 | Discharge: 2015-12-25 | Disposition: A | Discharge status: Home / Self Care | Payer: Medicare Other | Source: Ambulatory Visit | Attending: Internal Medicine | Admitting: Internal Medicine

## 2015-12-25 DIAGNOSIS — B999 Unspecified infectious disease: Secondary | ICD-10-CM | POA: Insufficient documentation

## 2015-12-25 NOTE — Progress Notes (Signed)
[name], [name] ([id number]) Visit Report for 12/24/2015 Chief Complaint Document Details Patient Name: [name], [name] Date of Service: 12/24/2015 8:00 AM Medical Record Patient Account Number: [id number] Number: Treating RN: Montey Hora September 01, 1940 (75 y.[name]. Other Clinician: Date of Birth/Sex: Female) Treating Kayden Amend Primary Care Physician/Extender: Orvis Brill Physician: Referring Physician: [name] in Treatment: 0 Information Obtained from: Patient Electronic Signature(s) Signed: 12/24/2015 4:39:00 PM By: [name] MD Entered By: [name] on 12/24/2015 09:59:14 [name], [name] ([id number]) -------------------------------------------------------------------------------- Debridement Details Patient Name: [name], [name] Date of Service: 12/24/2015 8:00 AM Medical Record Patient Account Number: [id number] Number: Treating RN: Montey Hora [birth year] (75 y.[name]. Other Clinician: Date of Birth/Sex: Female) Treating Neesa Knapik Primary Care Physician/Extender: Niel Hummer, LAURA Physician: Referring Physician: [name] in Treatment: 0 Debridement Performed for Wound #1 Midline Chest Assessment: Performed By: Physician Ricard Dillon, MD Debridement: Debridement Pre-procedure Yes - 74:50 Verification/Time Out Taken: Start Time: 74:50 Pain Control: Lidocaine 4% Topical Solution Level: Skin/Subcutaneous Tissue Total Area Debrided (L x 9 (cm) x 0.6 (cm) = 5.4 (cm) W): Tissue and other Viable, Non-Viable, Fibrin/Slough, Other, Subcutaneous material debrided: Instrument: Curette, Forceps, Scissors Specimen: Swab Number of Specimens 1 Taken: Bleeding: Minimum Hemostasis Achieved: Pressure End Time: 08:55 Procedural Pain: 0 Post Procedural Pain: 0 Response to Treatment: Procedure was tolerated well Post Debridement Measurements of Total Wound Length: (cm) 9 Width: (cm) 0.6 Depth: (cm) 2.3 Volume:  (cm) 9.755 Character of Wound/Ulcer Post Improved Debridement: Severity of Tissue Post Debridement: Fat layer exposed Post Procedure Diagnosis Same as Pre-procedure [name], [name] ([id number]) Electronic Signature(s) Signed: 12/24/2015 4:39:00 PM By: [name] MD Signed: 12/24/2015 6:00:24 PM By: Montey Hora Entered By: [name] on 12/24/2015 09:58:56 [name], [name] ([id number]) -------------------------------------------------------------------------------- HPI Details Patient Name: [name], [name] Date of Service: 12/24/2015 8:00 AM Medical Record Patient Account Number: [id number] Number: Treating RN: Montey Hora [birth year] (75 y.[name]. Other Clinician: Date of Birth/Sex: Female) Treating Kaydn Kumpf Primary Care Physician/Extender: Orvis Brill Physician: Referring Physician: [name] in Treatment: 0 History of Present Illness HPI Description: 12/24/15; [name] is a 75 year old woman who is the mother of one of our other clinic patient's [name] He comes [place] today for review a nonhealing surgical CABG wound. The patient underwent CABG o2 on October 31 17 at [facility]. She apparently had had a myocardial infarction in October but briefly presented with shortness of breath. She underwent surgery which [name] understand was uneventful. According to the patient her surgical incision never really healed in 2 spots. One is at the inferior recess question drain site, the other more superiorly about an inch from the superior surface of this it incision. She has been followed by her surgeon at [facility] who felt that both areas which he'll according to the patient. She was put on Augmentin 2 weeks ago and she is finished a course of this culture at that time showed Escherichia coli which should've been sensitive. The patient also relates that she has a history of allergy to ampicillin. Besides this the patient has a history of type 2  diabetes on metformin, multiple myeloma. She had a chest x-ray in mid October the did not show any abnormalities. [name] cannot see that she had any specific views of the sternum however. We have her referral note from her primary physician Dr. Bernie Covey. She was concerned about erythema around the wound and as mentioned the culture here showed Escherichia coli  which should have been Augmentin sensitive but was resistant to ampicillin, Keflex third generation cephalosporins quinolones and sulfonamides. [ESBL). The patient does not complain of fever or chills excessive pain. She does say that the lower surgical wound has been draining what looks to her like pus. She has been using triple antibiotic cream. Electronic Signature(s) Signed: 12/24/2015 4:39:00 PM By: [name] MD Entered By: [name] on 12/24/2015 10:05:10 [name], [name] ([id number]) -------------------------------------------------------------------------------- Physical Exam Details Patient Name: [name], [name] Date of Service: 12/24/2015 8:00 AM Medical Record Patient Account Number: [id number] Number: Treating RN: Montey Hora 1940-07-06 (75 y.[name]. Other Clinician: Date of Birth/Sex: Female) Treating Shaniyah Wix Primary Care Physician/Extender: Orvis Brill Physician: Referring Physician: [name] in Treatment: 0 Constitutional Patient is hypertensive.. Pulse regular and within target range for patient.Marland Kitchen Respirations regular, non-labored and within target range.. Temperature is normal and within the target range for the patient.. Patient's appearance is neat and clean. Appears in no acute distress. Well nourished and well developed.. Eyes Conjunctivae clear. No discharge.. Neck Neck supple and symmetrical. No masses or crepitus. Respiratory Respiratory effort is easy and symmetric bilaterally. Rate is normal at rest and on room air.. Bilateral breath sounds are clear and equal  in all lobes with no wheezes, rales or rhonchi.. Cardiovascular Heart rhythm and rate regular, without murmur or gallop.. Gastrointestinal (GI) Abdomen is soft and non-distended without masses or tenderness. Bowel sounds active in all quadrants.. No liver or spleen enlargement or tenderness.. Lymphatic Nonpalpable in the cervical clavicular area.. Integumentary (Hair, Skin) There is no obvious erythema around the surgical site.Marland Kitchen Psychiatric No evidence of depression, anxiety, or agitation. Calm, cooperative, and communicative. Appropriate interactions and affect.. Notes Wound exam;; the patient has 2 open areas in her surgical site. Inferiorly is a 2.5 cm in depth area which may have been a surgical drain site. This was debrided of copious surface slough nonviable subcutaneous tissue with a #3 curet to tolerates this well. This does not probe to bone was some sanguinous drainage here which [name] repeated the culture of. There is no surrounding tenderness here no surrounding erythema oThe second wound is more superiorly about an inch from the top of the surgical incision. This is more superficial wound again debrided with a #3 curet to remove surface slough and nonviable tissue, post debridement the wound cleans up quite nicely [name] removed some protruding suture. Periwound looks fairly stable here no tenderness no obvious infection Electronic Signature(s) TRINADEE, VERHAGEN ([id number]) Signed: 12/24/2015 4:39:00 PM By: [name] MD Entered By: [name] on 12/24/2015 10:09:06 Stephens, [name] ([id number]) -------------------------------------------------------------------------------- Physician Orders Details Patient Name: [name], [name] Date of Service: 12/24/2015 8:00 AM Medical Record Patient Account Number: [id number] Number: Treating RN: Montey Hora Feb 02, 1940 (75 y.[name]. Other Clinician: Date of Birth/Sex: Female) Treating Helder Crisafulli Primary Care  Physician/Extender: Niel Hummer, LAURA Physician: Referring Physician: [name] in Treatment: 0 Verbal / Phone Orders: Yes Clinician: Montey Hora Read Back and Verified: Yes Diagnosis Coding Wound Cleansing Wound #1 Midline Chest [name] Clean wound with Normal Saline. [name] May Shower, gently pat wound dry prior to applying new dressing. Anesthetic Wound #1 Midline Chest [name] Topical Lidocaine 4% cream applied to wound bed prior to debridement Primary Wound Dressing Wound #1 Midline Chest [name] Aquacel Ag Secondary Dressing Wound #1 Midline Chest [name] Dry Gauze [name] Other - telfa island Dressing Change Frequency Wound #1 Midline Chest [name] Change dressing every day. Follow-up Appointments Wound #1 Midline Chest   [name] Return Appointment in 1 week. Additional Orders / Instructions Wound #1 Midline Chest [name] Increase protein intake. Laboratory Havlik, Winta A. ([id number]) [name] Bacteria identified in Wound by Culture (MICRO) - midline chest oooo LOINC Code: 3149-7 oooo Convenience Name: Wound culture routine Electronic Signature(s) Signed: 12/24/2015 4:39:00 PM By: [name] MD Signed: 12/24/2015 6:00:24 PM By: Montey Hora Entered By: Montey Hora on 12/24/2015 09:01:19 [name], [name] A. ([id number]) -------------------------------------------------------------------------------- Problem List Details Patient Name: Sudduth, Etosha A. Date of Service: 12/24/2015 8:00 AM Medical Record Patient Account Number: [id number] Number: Treating RN: Montey Hora 04-Sep-1940 (75 y.[name]. 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 ([id number]) -------------------------------------------------------------------------------- Progress Note Details Patient Name: Cottier, Tanisa A. Date of Service: 12/24/2015 8:00 AM Medical Record Patient Account Number: [id number] Number: Treating RN: Montey Hora [birth year] (75 y.[name]. Other Clinician: Date of Birth/Sex: Female) Treating Tina Temme Primary Care Physician/Extender: Orvis Brill Physician: Referring Physician: [name] in Treatment: 0 Subjective Chief Complaint Information obtained from Patient History of Present Illness (HPI) 12/24/15; Mrs. Isais is a 75 year old woman who is the mother of one of our other clinic patient's [name] Rhome. He comes [place] today for review a nonhealing surgical CABG wound. The patient underwent CABG o2 on October 31 17 at [facility]. She apparently had had a myocardial infarction in October but briefly presented with shortness of breath. She underwent surgery which [name] understand was uneventful. According to the patient her surgical incision never really healed in 2 spots. One is at the inferior recess question drain site, the other more superiorly about an inch from the superior surface of this it incision. She has been followed by her surgeon at [facility] who felt that both areas which he'll according to the patient. She was put on Augmentin 2 weeks ago and she is finished a course of this culture at that time showed Escherichia coli which should've been sensitive. The patient also relates that she has a history of allergy to ampicillin. Besides this the patient has a history of type 2 diabetes on  metformin, multiple myeloma. She had a chest x-ray in mid October the did not show any abnormalities. [name] cannot see that she had any specific views of the sternum however. We have her referral note from her primary physician Dr. Bernie Covey. She was concerned about erythema around the wound and as mentioned the culture here showed Escherichia coli which should have been Augmentin sensitive but was resistant to ampicillin, Keflex third generation cephalosporins quinolones and sulfonamides. [ESBL). The patient does not complain of fever or chills excessive pain. She does say that the lower surgical wound has been draining what looks to her like pus. She has been using triple antibiotic cream. Patient History Allergies Aricept, Benzodiazepines, Opioids - Morphine Analogues, penicillin, Sulfa (Sulfonamide Antibiotics), Statins-Hmg-Coa Reductase Inhibitors Family History Cancer - Father, Diabetes - Mother, Heart Disease - Mother, Hypertension - Mother, No family history of Hereditary Spherocytosis, Kidney Disease, Lung Disease, Seizures, Stroke, Thyroid Problems, Tuberculosis. Social History  Ham, Abbi A. ([id number]) Never smoker, Marital Status - Divorced, Alcohol Use - Never, Drug Use - No History, Caffeine Use - Moderate. Medical History Eyes Patient has history of Cataracts - removed Hematologic/Lymphatic Patient has history of Anemia Cardiovascular Patient has history of Congestive Heart Failure, Coronary Artery Disease, Hypertension Endocrine Patient has history of Type II Diabetes Musculoskeletal Patient has history of Osteoarthritis Neurologic Patient has history of Neuropathy Oncologic Patient has history of Received Chemotherapy Patient is treated with Oral Agents. Blood sugar is tested. 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 Eyes Complains or has symptoms of Glasses / Contacts - glasses. Ear/Nose/Mouth/Throat The patient has no complaints or symptoms. Hematologic/Lymphatic The patient has no complaints or symptoms. Respiratory The patient has no complaints or symptoms. Cardiovascular Complains or has symptoms of LE edema. Gastrointestinal The patient has no complaints or symptoms. Endocrine The patient has no complaints or symptoms. Genitourinary The patient has no complaints or symptoms. Immunological The patient has no complaints or symptoms. Integumentary (Skin) The patient has no complaints or symptoms. Musculoskeletal The patient has no complaints or symptoms. Neurologic Ostrow, Lindia A. ([id number]) The patient has no complaints or symptoms. Psychiatric The patient has no complaints or symptoms. Objective Constitutional Patient is hypertensive.. Pulse regular and within target range for patient.Marland Kitchen Respirations regular, non-labored and within target range.. Temperature is normal and within the target range for the patient.. Patient's appearance is neat and clean. Appears in no acute distress. Well nourished and well developed.. Vitals Time Taken: 8:16 AM, Height: 62 in, Source: Measured, Weight: 161 lbs, Source: Measured, BMI: 29.4, Temperature: 97.8 F, Pulse: 75 bpm, Respiratory Rate: 16 breaths/min, Blood Pressure: 174/61 mmHg. Eyes Conjunctivae clear. No discharge.. Neck Neck supple and symmetrical. No masses or crepitus. Respiratory Respiratory effort is easy and symmetric bilaterally. Rate is normal at rest and on room air.. Bilateral breath sounds are clear and equal in all lobes with no wheezes, rales or rhonchi.. Cardiovascular Heart rhythm and rate regular, without murmur or gallop.. Gastrointestinal (GI) Abdomen is soft and non-distended without masses or tenderness. Bowel sounds active in all quadrants.. No liver or spleen enlargement or  tenderness.. Lymphatic Nonpalpable in the cervical clavicular area.Marland Kitchen Psychiatric No evidence of depression, anxiety, or agitation. Calm, cooperative, and communicative. Appropriate interactions and affect.. General Notes: Wound exam;; the patient has 2 open areas in her surgical site. Inferiorly is a 2.5 cm in depth area which may have been a surgical drain site. This was debrided of copious surface slough nonviable subcutaneous tissue with a #3 curet to tolerates this well. This does not probe to bone was some sanguinous drainage here which [name] repeated the culture of. There is no surrounding tenderness here no [name], [name] ([id number]) surrounding erythema The second wound is more superiorly about an inch from the top of the surgical incision. This is more superficial wound again debrided with a #3 curet to remove surface slough and nonviable tissue, post debridement the wound cleans up quite nicely [name] removed some protruding suture. Periwound looks fairly stable here no tenderness no obvious infection Integumentary (Hair, Skin) There is no obvious erythema around the surgical site.. Wound #1 status is Open. Original cause of wound was Surgical Injury. The wound is located on the Midline Chest. The wound measures 9cm length x 0.6cm width x 0.7cm depth; 4.241cm^2 area and 2.969cm^3 volume. The wound is limited to skin  breakdown. There is no tunneling noted, however, there is undermining starting at 1:00 and ending at 1:00 with a maximum distance of 0.7cm. There is a large amount of serous drainage noted. The wound margin is flat and intact. There is small (1-33%) red granulation within the wound bed. There is a large (67-100%) amount of necrotic tissue within the wound bed including Eschar and Adherent Slough. The periwound skin appearance exhibited: Localized Edema, Erythema. The periwound skin appearance did not exhibit: Callus, Crepitus, Excoriation, Fluctuance, Friable, Induration, Rash,  Scarring, Dry/Scaly, Maceration, Moist, Atrophie Blanche, Cyanosis, Ecchymosis, Hemosiderin Staining, Mottled, Pallor, Rubor. The surrounding wound skin color is noted with erythema which is circumferential. Periwound temperature was noted as No Abnormality. 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 There was a Skin/Subcutaneous Tissue Debridement ([id number]) debridement with total area of 5.4 sq cm performed by Ricard Dillon, MD. with the following instrument(s): Curette, Forceps, and Scissors to remove Viable and Non-Viable tissue/material including Fibrin/Slough, Other, and Subcutaneous after achieving pain control using Lidocaine 4% Topical Solution. 1 Specimen was taken by a Swab and sent to the lab per facility protocol.A time out was conducted at 08:50, prior to the start of the procedure. A Minimum amount of bleeding was controlled with Pressure. The procedure was tolerated well with a pain level of 0 throughout and a pain level of 0 following the procedure. Post Debridement Measurements: 9cm length x 0.6cm width x 2.3cm depth; 9.755cm^3 volume. Okazaki, [name] ([id number]) Character of Wound/Ulcer Post Debridement is improved. Severity of Tissue Post Debridement is: Fat layer exposed. Post procedure Diagnosis Wound #1: Same as Pre-Procedure Plan Wound Cleansing: Wound #1 Midline Chest: Clean wound with Normal Saline. May Shower, gently pat wound dry prior to applying new dressing. Anesthetic: Wound #1 Midline Chest: Topical Lidocaine 4% cream applied to wound bed prior to debridement Primary Wound Dressing: Wound #1 Midline Chest: Aquacel Ag Secondary Dressing: Wound  #1 Midline Chest: Dry Gauze Other - telfa island Dressing Change Frequency: Wound #1 Midline Chest: Change dressing every day. Follow-up Appointments: Wound #1 Midline Chest: Return Appointment in 1 week. Additional Orders / Instructions: Wound #1 Midline Chest: Increase protein intake. Laboratory ordered were: Wound culture routine - midline chest #12 open wounds in her surgical incision from a CABG on 10/31 #2 surgical wound infection with ESBL Escherichia coli she is completed Augmentin at this point [name] repeated a culture but will give no additional antibiotics for now #3 silver alginate as the primary wound dressing, Telfa. Patient can change this every second day or daily. ARLENE, BRICKEL ([id number]) Electronic Signature(s) Signed: 12/24/2015 4:39:00 PM By: [name] MD Entered By: [name] on 12/24/2015 10:11:14 [name], [name] ([id number]) -------------------------------------------------------------------------------- ROS/PFSH Details Patient Name: Kegel, Conna A. Date of Service: 12/24/2015 8:00 AM Medical Record Patient Account Number: [id number] Number: Treating RN: Montey Hora 09-29-1940 (75 y.[name]. Other Clinician: Date of Birth/Sex: Female) Treating Shaily Librizzi Primary Care Physician/Extender: Niel Hummer, LAURA Physician: Referring Physician: [name] in Treatment: 0 Wound History Eyes Complaints and Symptoms: Positive for: Glasses / Contacts - glasses Medical History: Positive for: Cataracts - removed Cardiovascular Complaints and Symptoms: Positive for: LE edema Medical History: Positive for: Congestive Heart Failure; Coronary Artery Disease; Hypertension Constitutional Symptoms (  General Health) Complaints and Symptoms: No Complaints or Symptoms Ear/Nose/Mouth/Throat Complaints and Symptoms: No Complaints or Symptoms Hematologic/Lymphatic Complaints and Symptoms: No Complaints or Symptoms Medical History: Positive  for: Anemia Respiratory Complaints and Symptoms: No Complaints or Symptoms Margraf, [name] A. ([id number]) Gastrointestinal Complaints and Symptoms: No Complaints or Symptoms Endocrine Complaints and Symptoms: No Complaints or Symptoms Medical History: Positive for: Type II Diabetes Time with diabetes: at least 20 years Treated with: Oral agents Blood sugar tested every day: Yes Tested : QD Genitourinary Complaints and Symptoms: No Complaints or Symptoms Immunological Complaints and Symptoms: No Complaints or Symptoms Integumentary (Skin) Complaints and Symptoms: No Complaints or Symptoms Musculoskeletal Complaints and Symptoms: No Complaints or Symptoms Medical History: Positive for: Osteoarthritis Neurologic Complaints and Symptoms: No Complaints or Symptoms Medical History: Positive for: Neuropathy Oncologic Medical History: Positive for: Received Chemotherapy Past Medical History Notes: Grattan, Zyniah A. ([id number]) multiple myeloma - no treatment currently Psychiatric Complaints and Symptoms: No Complaints or Symptoms HBO Extended History Items Eyes: Cataracts Immunizations Pneumococcal Vaccine: Received Pneumococcal Vaccination: No Immunization Notes: up to date Family and Social History Cancer: Yes - Father; Diabetes: Yes - Mother; Heart Disease: Yes - Mother; Hereditary Spherocytosis: No; Hypertension: Yes - Mother; Kidney Disease: No; Lung Disease: No; Seizures: No; Stroke: No; Thyroid Problems: No; Tuberculosis: No; Never smoker; Marital Status - Divorced; Alcohol Use: Never; Drug Use: No History; Caffeine Use: Moderate; Financial Concerns: No; Food, Clothing or Shelter Needs: No; Support System Lacking: No; Transportation Concerns: No; Advanced Directives: Yes; Living Will: Yes; Medical Power of Attorney: Yes - Deam Mccowan - son Electronic Signature(s) Signed: 12/24/2015 4:39:00 PM By: [name] MD Signed: 12/24/2015 6:00:24 PM By: Montey Hora Entered By: Montey Hora on 12/24/2015 08:31:25 Rule, Maeryn A. ([id number]) -------------------------------------------------------------------------------- SuperBill Details Patient Name: Twedt, Kimberlynn A. Date of Service: 12/24/2015 Medical Record Patient Account Number: [id number] Number: Treating RN: Montey Hora [birth year] (75 y.[name]. 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 Facility Procedures CPT4: Description Modifier Quantity Code [id number] - WOUND CARE VISIT-LEV 3 EST PT 1 CPT4: 37048889 11042 - DEB SUBQ TISSUE 20 SQ CM/< 1 ICD-10 Description Diagnosis T81.31XD Disruption of external operation (surgical) wound, not elsewhere classified, subsequent encounter 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 ([id number]) Electronic Signature(s) Signed: 12/24/2015 4:39:00 PM By: [name] MD Entered By: [name] on 12/24/2015 10:12:03

## 2015-12-25 NOTE — Progress Notes (Signed)
[name] ([id number]) Visit Report for 12/24/2015 Allergy List Details Patient Name: [name], [name] Date of Service: 12/24/2015 8:00 AM Medical Record Patient Account Number: [id number] Number: Treating RN: [name] [birth year] (75 y.o. Other Clinician: Date of Birth/Sex: Female) Treating [name] Primary Care Physician: [name] Physician/Extender: [name] Referring Physician: Sallee [name] in Treatment: 0 Allergies Active Allergies Aricept Benzodiazepines Opioids - Morphine Analogues penicillin Sulfa (Sulfonamide Antibiotics) Statins-Hmg-Coa Reductase Inhibitors Allergy Notes Electronic Signature(s) Signed: 12/24/2015 6:00:24 PM By: [name] Entered By: [name] on 12/24/2015 08:21:53 Thien, Ritisha A. ([id number]) -------------------------------------------------------------------------------- Arrival Information Details Patient Name: [name], [name] Date of Service: 12/24/2015 8:00 AM Medical Record Patient Account Number: [id number] Number: Treating RN: [name] March 02, 1940 (75 y.o. Other Clinician: Date of Birth/Sex: Female) Treating [name] Primary Care Physician: [name] Physician/Extender: [name] Referring Physician: Sallee [name] in Treatment: 0 Visit Information Patient Arrived: Walker Arrival Time: 08:11 Accompanied By: self Transfer Assistance: None Patient Identification Verified: Yes Secondary Verification Process Completed: Yes Patient Has Alerts: Yes Patient Alerts: DMII Electronic Signature(s) Signed: 12/24/2015 6:00:24 PM By: [name] Entered By: [name] on 12/24/2015 08:14:36 [name] ([id number]) -------------------------------------------------------------------------------- Clinic Level of Care Assessment Details Patient Name: [name], [name] Date of Service: 12/24/2015 8:00 AM Medical Record Patient Account Number: [id number] Number: Treating  RN: [name] 11/29/1940 (75 y.o. Other Clinician: Date of Birth/Sex: Female) Treating [name] Primary Care Physician: [name] Physician/Extender: [name] Referring Physician: Sallee [name] in Treatment: 0 Clinic Level of Care Assessment Items TOOL 1 Quantity Score []  - Use when EandM and Procedure is performed on INITIAL visit 0 ASSESSMENTS - Nursing Assessment / Reassessment X - General Physical Exam (combine w/ comprehensive assessment (listed just 1 20 below) when performed on new pt. evals) X - Comprehensive Assessment (HX, ROS, Risk Assessments, Wounds Hx, etc.) 1 25 ASSESSMENTS - Wound and Skin Assessment / Reassessment []  - Dermatologic / Skin Assessment (not related to wound area) 0 ASSESSMENTS - Ostomy and/or Continence Assessment and Care []  - Incontinence Assessment and Management 0 []  - Ostomy Care Assessment and Management (repouching, etc.) 0 PROCESS - Coordination of Care X - Simple Patient / Family Education for ongoing care 1 15 []  - Complex (extensive) Patient / Family Education for ongoing care 0 X - Staff obtains Programmer, systems, Records, Test Results / Process Orders 1 10 []  - Staff telephones HHA, Nursing Homes / Clarify orders / etc 0 []  - Routine Transfer to another Facility (non-emergent condition) 0 []  - Routine Hospital Admission (non-emergent condition) 0 X - New Admissions / Biomedical engineer / Ordering NPWT, Apligraf, etc. 1 15 []  - Emergency Hospital Admission (emergent condition) 0 PROCESS - Special Needs []  - Pediatric / Minor Patient Management 0 Oley, Adilee A. ([id number]) []  - Isolation Patient Management 0 []  - Hearing / Language / Visual special needs 0 []  - Assessment of Community assistance (transportation, D/C planning, etc.) 0 []  - Additional assistance / Altered mentation 0 []  - Support Surface(s) Assessment (bed, cushion, seat, etc.) 0 INTERVENTIONS - Miscellaneous []  - External ear exam 0 []  - Patient Transfer  (multiple staff / Civil Service fast streamer / Similar devices) 0 X - Simple Staple / Suture removal (25 or less) 1 5 []  - Complex Staple / Suture removal (26 or more) 0 []  - Hypo/Hyperglycemic Management (do not check if billed separately) 0 []  - Ankle / Brachial Index (ABI) - do not check if billed separately 0 Has the patient been  seen at the hospital within the last three years: Yes Total Score: 90 Level Of Care: New/Established - Level 3 Electronic Signature(s) Signed: 12/24/2015 6:00:24 PM By: [name] Entered By: [name] on 12/24/2015 08:51:57 [name], [name] ([id number]) -------------------------------------------------------------------------------- Encounter Discharge Information Details Patient Name: [name], [name] Date of Service: 12/24/2015 8:00 AM Medical Record Patient Account Number: [id number] Number: Treating RN: [name] [birth year] (75 y.o. Other Clinician: Date of Birth/Sex: Female) Treating [name] Primary Care Physician: [name] Physician/Extender: [name] Referring Physician: Sallee [name] in Treatment: 0 Encounter Discharge Information Items Discharge Pain Level: 0 Discharge Condition: Stable Ambulatory Status: Walker Discharge Destination: Home Transportation: Private Auto Accompanied By: self Schedule Follow-up Appointment: Yes Medication Reconciliation completed and provided to Patient/Care No [name]: Provided on Clinical Summary of Care: 12/24/2015 Form Type Recipient Paper Patient AW Electronic Signature(s) Signed: 12/24/2015 9:15:14 AM By: [name] Entered By: [name] on 12/24/2015 09:15:13 [name], [name] ([id number]) -------------------------------------------------------------------------------- Multi Wound Chart Details Patient Name: [name], [name] Date of Service: 12/24/2015 8:00 AM Medical Record Patient Account Number: [id number] Number: Treating RN: [name] March 24, 1940 (75 y.o.  Other Clinician: Date of Birth/Sex: Female) Treating [name] Primary Care Physician: [name] Physician/Extender: [name] Referring Physician: Sallee [name] in Treatment: 0 Vital Signs Height(in): 62 Pulse(bpm): 75 Weight(lbs): 161 Blood Pressure 174/61 (mmHg): Body Mass Index(BMI): 29 Temperature(F): 97.8 Respiratory Rate 16 (breaths/min): Photos: [1:No Photos] [N/A:N/A] Wound Location: [1:Chest - Midline] [N/A:N/A] Wounding Event: [1:Surgical Injury] [N/A:N/A] Primary Etiology: [1:Open Surgical Wound] [N/A:N/A] Comorbid History: [1:Cataracts, Anemia, Congestive Heart Failure, Coronary Artery Disease, Hypertension, Type II Diabetes, Osteoarthritis, Neuropathy, Received Chemotherapy] [N/A:N/A] Date Acquired: [1:11/11/2015] [N/A:N/A] Weeks of Treatment: [1:0] [N/A:N/A] Wound Status: [1:Open] [N/A:N/A] Measurements L x W x D 9x0.6x0.7 [N/A:N/A] (cm) Area (cm) : [1:4.241] [N/A:N/A] Volume (cm) : [1:2.969] [N/A:N/A] Starting Position 1 1 (o'clock): Ending Position 1 [1:1] (o'clock): Maximum Distance 1 0.7 (cm): Undermining: [1:Yes] [N/A:N/A] Classification: [1:Full Thickness Without Exposed Support Structures] [N/A:N/A] Exudate Amount: Large N/A N/A Exudate Type: Serous N/A N/A Exudate Color: amber N/A N/A Wound Margin: Flat and Intact N/A N/A Granulation Amount: Small (1-33%) N/A N/A Granulation Quality: Red N/A N/A Necrotic Amount: Large (67-100%) N/A N/A Necrotic Tissue: Eschar, Adherent Slough N/A N/A Exposed Structures: Fascia: No N/A N/A Fat: No Tendon: No Muscle: No Joint: No Bone: No Limited to Skin Breakdown Epithelialization: None N/A N/A Periwound Skin Texture: Edema: Yes N/A N/A Excoriation: No Induration: No Callus: No Crepitus: No Fluctuance: No Friable: No Rash: No Scarring: No Periwound Skin Maceration: No N/A N/A Moisture: Moist: No Dry/Scaly: No Periwound Skin Color: Erythema: Yes N/A N/A Atrophie Blanche:  No Cyanosis: No Ecchymosis: No Hemosiderin Staining: No Mottled: No Pallor: No Rubor: No Erythema Location: Circumferential N/A N/A Temperature: No Abnormality N/A N/A Tenderness on No N/A N/A Palpation: Wound Preparation: Ulcer Cleansing: N/A N/A Rinsed/Irrigated with Saline Topical Anesthetic Applied: Other: lidocaine 4% Treatment Notes [name] A. ([id number]) Electronic Signature(s) Signed: 12/24/2015 6:00:24 PM By: [name] Entered By: [name] on 12/24/2015 08:48:38 Shrieves, Ariyan [name] Kitchen ([id number]) -------------------------------------------------------------------------------- [place] Details Patient Name: [name], [name] Date of Service: 12/24/2015 8:00 AM Medical Record Patient Account Number: [id number] Number: Treating RN: [name] 20-Dec-1940 (75 y.o. Other Clinician: Date of Birth/Sex: Female) Treating [name] Primary Care Physician: [name] Physician/Extender: [name] Referring Physician: Sallee [name] in Treatment: 0 Active Inactive Abuse / Safety / Falls / Self Care Management Nursing Diagnoses: Impaired physical mobility Potential for falls Goals:  Patient will remain injury free Date Initiated: 12/24/2015 Goal Status: Active Interventions: Assess fall risk on admission and as needed Notes: Orientation to the Wound Care Program Nursing Diagnoses: Knowledge deficit related to the wound healing center program Goals: Patient/caregiver will verbalize understanding of the [place] Program Date Initiated: 12/24/2015 Goal Status: Active Interventions: Provide education on orientation to the wound center Notes: Wound/Skin Impairment Nursing Diagnoses: Impaired tissue integrity Clipper, Kemora A. ([id number]) Goals: Patient/caregiver will verbalize understanding of skin care regimen Date Initiated: 12/24/2015 Goal Status: Active Ulcer/skin breakdown will have a volume reduction of  30% by week 4 Date Initiated: 12/24/2015 Goal Status: Active Ulcer/skin breakdown will have a volume reduction of 50% by week 8 Date Initiated: 12/24/2015 Goal Status: Active Ulcer/skin breakdown will have a volume reduction of 80% by week 12 Date Initiated: 12/24/2015 Goal Status: Active Ulcer/skin breakdown will heal within 14 weeks Date Initiated: 12/24/2015 Goal Status: Active Interventions: Assess patient/caregiver ability to obtain necessary supplies Assess patient/caregiver ability to perform ulcer/skin care regimen upon admission and as needed Assess ulceration(s) every visit Notes: Electronic Signature(s) Signed: 12/24/2015 6:00:24 PM By: [name] Entered By: [name] on 12/24/2015 08:48:02 Watters, Lilyannah A. ([id number]) -------------------------------------------------------------------------------- Pain Assessment Details Patient Name: [name], [name] Date of Service: 12/24/2015 8:00 AM Medical Record Patient Account Number: [id number] Number: Treating RN: [name] [birth year] (75 y.o. Other Clinician: Date of Birth/Sex: Female) Treating [name] Primary Care Physician: [name] Physician/Extender: [name] Referring Physician: Sallee [name] in Treatment: 0 Active Problems Location of Pain Severity and Description of Pain Patient Has Paino Yes Site Locations Pain Location: Generalized Pain, Pain in Ulcers With Dressing Change: Yes Duration of the Pain. Constant / Intermittento Constant Character of Pain Describe the Pain: Tender, Other: sore Pain Management and Medication Current Pain Management: Notes Topical or injectable lidocaine is offered to patient for acute pain when surgical debridement is performed. If needed, Patient is instructed to use over the counter pain medication for the following 24-48 hours after debridement. Wound care MDs do not prescribed pain medications. Patient has chronic pain or uncontrolled pain.  Patient has been instructed to make an appointment with their Primary Care Physician for pain management. Electronic Signature(s) Signed: 12/24/2015 6:00:24 PM By: [name] Entered By: [name] on 12/24/2015 08:16:29 Nachtigal, Mayleigh Loni Muse ([id number]) -------------------------------------------------------------------------------- Patient/Caregiver Education Details Patient Name: [name], [name] Date of Service: 12/24/2015 8:00 AM Medical Record Patient Account Number: [id number] Number: Treating RN: [name] [birth year] (75 y.o. Other Clinician: Date of Birth/Gender: Female) Treating [name] Primary Care Physician: [name] Physician/Extender: [name] Referring Physician: Sallee [name] in Treatment: 0 Education Assessment Education Provided To: Patient Education Topics Provided Wound/Skin Impairment: Handouts: Other: wound care as ordered Methods: Demonstration, Explain/Verbal Responses: State content correctly Electronic Signature(s) Signed: 12/24/2015 6:00:24 PM By: [name] Entered By: [name] on 12/24/2015 08:51:20 [name], [name] ([id number]) -------------------------------------------------------------------------------- Wound Assessment Details Patient Name: [name], [name] Date of Service: 12/24/2015 8:00 AM Medical Record Patient Account Number: [id number] Number: Treating RN: [name] May 16, 1940 (75 y.o. Other Clinician: Date of Birth/Sex: Female) Treating [name] Primary Care Physician: [name] Physician/Extender: [name] Referring Physician: Sallee [name] in Treatment: 0 Wound Status Wound Number: 1 Primary Open Surgical Wound Etiology: Wound Location: Chest - Midline Wound Open Wounding Event: Surgical Injury Status: Date Acquired: 11/11/2015 Comorbid Cataracts, Anemia, Congestive Heart Weeks Of Treatment: 0 History: Failure, Coronary Artery Disease, Clustered Wound:  No Hypertension, Type II Diabetes, Osteoarthritis, Neuropathy, Received  Chemotherapy Photos Wound Measurements Length: (cm) 9 % Reduction in Are Width: (cm) 0.6 % Reduction in Vol Depth: (cm) 0.7 Epithelialization: Area: (cm) 4.241 Tunneling: Volume: (cm) 2.969 Undermining: Starting Positi Ending Position Maximum Distanc a: 0% ume: 0% None No Yes on (o'clock): 1 (o'clock): 1 e: (cm) 0.7 Wound Description Full Thickness Without Exposed Foul Odor After C Classification: Support Structures Wound Margin: Flat and Intact Large [name], [name] ([id number]) leansing: No Exudate Amount: Exudate Type: Serous Exudate Color: amber Wound Bed Granulation Amount: Small (1-33%) Exposed Structure Granulation Quality: Red Fascia Exposed: No Necrotic Amount: Large (67-100%) Fat Layer Exposed: No Necrotic Quality: Eschar, Adherent Slough Tendon Exposed: No Muscle Exposed: No Joint Exposed: No Bone Exposed: No Limited to Skin Breakdown Periwound Skin Texture Texture Color No Abnormalities Noted: No No Abnormalities Noted: No Callus: No Atrophie Blanche: No Crepitus: No Cyanosis: No Excoriation: No Ecchymosis: No Fluctuance: No Erythema: Yes Friable: No Erythema Location: Circumferential Induration: No Hemosiderin Staining: No Localized Edema: Yes Mottled: No Rash: No Pallor: No Scarring: No Rubor: No Moisture Temperature / Pain No Abnormalities Noted: No Temperature: No Abnormality Dry / Scaly: No Maceration: No Moist: No Wound Preparation Ulcer Cleansing: Rinsed/Irrigated with Saline Topical Anesthetic Applied: Other: lidocaine 4%, Treatment Notes Wound #1 (Midline Chest) 1. Cleansed with: Clean wound with Normal Saline 2. Anesthetic Topical Lidocaine 4% cream to wound bed prior to debridement 4. Dressing Applied: Aquacel Ag 5. Secondary Dressing Applied Loera, Preston ([id number]) Dry [place] Signature(s) Signed:  12/24/2015 6:00:24 PM By: [name] Entered By: [name] on 12/24/2015 09:49:44 Petrich, Ori A. ([id number]) -------------------------------------------------------------------------------- [place] Details Patient Name: Muscato, Maham A. Date of Service: 12/24/2015 8:00 AM Medical Record Patient Account Number: [id number] Number: Treating RN: [name] [birth year] (75 y.o. Other Clinician: Date of Birth/Sex: Female) Treating [name] Primary Care Physician: [name] Physician/Extender: [name] Referring Physician: Sallee [name] in Treatment: 0 Vital Signs Time Taken: 08:16 Temperature (F): 97.8 Height (in): 62 Pulse (bpm): 75 Source: Measured Respiratory Rate (breaths/min): 16 Weight (lbs): 161 Blood Pressure (mmHg): 174/61 Source: Measured Reference Range: 80 - 120 mg / dl Body Mass Index (BMI): 29.4 Electronic Signature(s) Signed: 12/24/2015 6:00:24 PM By: [name] Entered By: [name] on 12/24/2015 08:18:30

## 2015-12-25 NOTE — Progress Notes (Signed)
[name], [name] ([id number]) Visit Report for 12/24/2015 Abuse/Suicide Risk Screen Details Patient Name: [name], [name] Date of Service: 12/24/2015 8:00 AM Medical Record Patient Account Number: [id number] Number: Treating RN: Montey Hora 12-26-1940 (75 y.o. Other Clinician: Date of Birth/Sex: Female) Treating ROBSON, MICHAEL Primary Care Physician/Extender: Niel Hummer, LAURA Physician: Referring Physician: [name] in Treatment: 0 Abuse/Suicide Risk Screen Items Answer ABUSE/SUICIDE RISK SCREEN: Has anyone close to you tried to hurt or harm you recentlyo No Do you feel uncomfortable with anyone in your familyo No Has anyone forced you do things that you didnot want to doo No Do you have any thoughts of harming yourselfo No Patient displays signs or symptoms of abuse and/or neglect. No Electronic Signature(s) Signed: 12/24/2015 6:00:24 PM By: Montey Hora Entered By: Montey Hora on 12/24/2015 08:22:02 Goodridge, Shandrea A. ([id number]) -------------------------------------------------------------------------------- Activities of Daily Living Details Patient Name: [name], [name] Date of Service: 12/24/2015 8:00 AM Medical Record Patient Account Number: [id number] Number: Treating RN: Montey Hora 1940-11-06 (75 y.o. Other Clinician: Date of Birth/Sex: Female) Treating ROBSON, MICHAEL Primary Care Physician/Extender: Niel Hummer, LAURA Physician: Referring Physician: [name] in Treatment: 0 Activities of Daily Living Items Answer Activities of Daily Living (Please select one for each item) Drive Automobile Completely Able Take Medications Completely Able Use Telephone Completely Able Care for Appearance Completely Able Use Toilet Completely Able Bath / Shower Completely Able Dress Self Completely Able Feed Self Completely Able Walk Completely Able Get In / Out Bed Completely Able Housework Completely Able Prepare Meals Completely  Able Handle Money Completely Able Shop for Self Completely Able Electronic Signature(s) Signed: 12/24/2015 6:00:24 PM By: Montey Hora Entered By: Montey Hora on 12/24/2015 08:22:18 Grieser, Petina AMarland Kitchen ([id number]) -------------------------------------------------------------------------------- Education Assessment Details Patient Name: [name], [name] Date of Service: 12/24/2015 8:00 AM Medical Record Patient Account Number: [id number] Number: Treating RN: Montey Hora 24-Jul-1940 (75 y.o. Other Clinician: Date of Birth/Sex: Female) Treating ROBSON, MICHAEL Primary Care Physician/Extender: Niel Hummer, LAURA Physician: Referring Physician: [name] in Treatment: 0 Primary Learner Assessed: Patient Learning Preferences/Education Level/Primary Language Learning Preference: Explanation, Demonstration Highest Education Level: College or Above Preferred Language: English Cognitive Barrier Assessment/Beliefs Language Barrier: No Translator Needed: No Memory Deficit: No Emotional Barrier: No Cultural/Religious Beliefs Affecting Medical No Care: Physical Barrier Assessment Impaired Vision: No Impaired Hearing: No Decreased Hand dexterity: No Knowledge/Comprehension Assessment Knowledge Level: Medium Comprehension Level: Medium Ability to understand written Medium instructions: Ability to understand verbal Medium instructions: Motivation Assessment Anxiety Level: Calm Cooperation: Cooperative Education Importance: Acknowledges Need Interest in Health Problems: Asks Questions Perception: Coherent Willingness to Engage in Self- Medium Management Activities: Medium Batres, Victoria. ([id number]) Readiness to Engage in Self- Management Activities: Electronic Signature(s) Signed: 12/24/2015 6:00:24 PM By: Montey Hora Entered By: Montey Hora on 12/24/2015 08:22:41 Fesler, Namiah A.  ([id number]) -------------------------------------------------------------------------------- Fall Risk Assessment Details Patient Name: [name], [name] Date of Service: 12/24/2015 8:00 AM Medical Record Patient Account Number: [id number] Number: Treating RN: Montey Hora Jul 16, 1940 (75 y.o. Other Clinician: Date of Birth/Sex: Female) Treating ROBSON, MICHAEL Primary Care Physician/Extender: Niel Hummer, LAURA Physician: Referring Physician: [name] in Treatment: 0 Fall Risk Assessment Items Have you had 2 or more falls in the last 12 monthso 0 No Have you had any fall that resulted in injury in the last 12 monthso 0 No FALL RISK ASSESSMENT: History of falling - immediate or within 3 months 0 No Secondary diagnosis 0 No Ambulatory aid None/bed rest/wheelchair/nurse 0 No  Crutches/cane/walker 15 Yes Furniture 0 No IV Access/Saline Lock 0 No Gait/Training Normal/bed rest/immobile 0 No Weak 10 Yes Impaired 0 No Mental Status Oriented to own ability 0 Yes Electronic Signature(s) Signed: 12/24/2015 6:00:24 PM By: Montey Hora Entered By: Montey Hora on 12/24/2015 08:23:35 Leffel, Nariah A. ([id number]) -------------------------------------------------------------------------------- Nutrition Risk Assessment Details Patient Name: [name], [name] Date of Service: 12/24/2015 8:00 AM Medical Record Patient Account Number: [id number] Number: Treating RN: Montey Hora 1940-06-13 (75 y.o. Other Clinician: Date of Birth/Sex: Female) Treating ROBSON, MICHAEL Primary Care Physician/Extender: Niel Hummer, LAURA Physician: Referring Physician: [name] in Treatment: 0 Height (in): 62 Weight (lbs): 161 Body Mass Index (BMI): 29.4 Nutrition Risk Assessment Items NUTRITION RISK SCREEN: I have an illness or condition that made me change the kind and/or 0 No amount of food I eat I eat fewer than two meals per day 0 No I eat few fruits and  vegetables, or milk products 0 No I have three or more drinks of beer, liquor or wine almost every day 0 No I have tooth or mouth problems that make it hard for me to eat 0 No I don't always have enough money to buy the food I need 0 No I eat alone most of the time 0 No I take three or more different prescribed or over-the-counter drugs a 1 Yes day Without wanting to, I have lost or gained 10 pounds in the last six 0 No months I am not always physically able to shop, cook and/or feed myself 0 No Nutrition Protocols Good Risk Protocol 0 No interventions needed Moderate Risk Protocol Electronic Signature(s) Signed: 12/24/2015 6:00:24 PM By: Montey Hora Entered By: Montey Hora on 12/24/2015 08:23:42

## 2015-12-27 LAB — AEROBIC CULTURE  (SUPERFICIAL SPECIMEN): CULTURE: NO GROWTH

## 2015-12-27 LAB — AEROBIC CULTURE W GRAM STAIN (SUPERFICIAL SPECIMEN)

## 2015-12-30 ENCOUNTER — Ambulatory Visit

## 2015-12-30 ENCOUNTER — Telehealth: Payer: Self-pay | Admitting: *Deleted

## 2015-12-30 NOTE — Telephone Encounter (Signed)
Called to check on [name] and her sternal incision wound.    Is packing the wound daily . Waiting for culture results. HaS appointment tomorrow at wound center to get the results and for recheck on the wound.

## 2015-12-31 ENCOUNTER — Encounter: Payer: Medicare Other | Admitting: Internal Medicine

## 2015-12-31 DIAGNOSIS — Z48812 Encounter for surgical aftercare following surgery on the circulatory system: Secondary | ICD-10-CM | POA: Diagnosis not present

## 2016-01-01 ENCOUNTER — Ambulatory Visit

## 2016-01-01 NOTE — Progress Notes (Signed)
IRA, DICENZO ([id number]) Visit Report for 12/31/2015 Arrival Information Details Patient Name: [name], [name] Date of Service: 12/31/2015 10:45 AM Medical Record Patient Account Number: [id number] Number: Treating RN: Montey Hora [birth year] (75 y.o. Other Clinician: Date of Birth/Sex: Female) Treating ROBSON, MICHAEL Primary Care Physician: Lavera [name] Physician/Extender: G Referring Physician: [name] in Treatment: 1 Visit Information History Since Last Visit Added or deleted any medications: No Patient Arrived: Walker Any new allergies or adverse reactions: No Arrival Time: 11:00 Had a fall or experienced change in No Accompanied By: self activities of daily living that may affect Transfer Assistance: None risk of falls: Patient Identification Verified: Yes Signs or symptoms of abuse/neglect since last No Secondary Verification Process Completed: Yes visito Patient Has Alerts: Yes Hospitalized since last visit: No Patient Alerts: DMII Has Dressing in Place as Prescribed: Yes Pain Present Now: No Electronic Signature(s) Signed: 12/31/2015 5:10:32 PM By: Montey Hora Entered By: Montey Hora on 12/31/2015 11:01:28 [name], [name] ([id number]) -------------------------------------------------------------------------------- Clinic Level of Care Assessment Details Patient Name: [name], [name] Date of Service: 12/31/2015 10:45 AM Medical Record Patient Account Number: [id number] Number: Treating RN: Montey Hora November 05, 1940 (75 y.o. Other Clinician: Date of Birth/Sex: Female) Treating ROBSON, [name] Primary Care Physician: Lavera [name] Physician/Extender: G Referring Physician: [name] in Treatment: 1 Clinic Level of Care Assessment Items TOOL 4 Quantity Score []  - Use when only an EandM is performed on FOLLOW-UP visit 0 ASSESSMENTS - Nursing Assessment / Reassessment X - Reassessment of Co-morbidities (includes  updates in patient status) 1 10 X - Reassessment of Adherence to Treatment Plan 1 5 ASSESSMENTS - Wound and Skin Assessment / Reassessment []  - Simple Wound Assessment / Reassessment - one wound 0 X - Complex Wound Assessment / Reassessment - multiple wounds 2 5 []  - Dermatologic / Skin Assessment (not related to wound area) 0 ASSESSMENTS - Focused Assessment []  - Circumferential Edema Measurements - multi extremities 0 []  - Nutritional Assessment / Counseling / Intervention 0 []  - Lower Extremity Assessment (monofilament, tuning fork, pulses) 0 []  - Peripheral Arterial Disease Assessment (using hand held doppler) 0 ASSESSMENTS - Ostomy and/or Continence Assessment and Care []  - Incontinence Assessment and Management 0 []  - Ostomy Care Assessment and Management (repouching, etc.) 0 PROCESS - Coordination of Care X - Simple Patient / Family Education for ongoing care 1 15 []  - Complex (extensive) Patient / Family Education for ongoing care 0 []  - Staff obtains Programmer, systems, Records, Test Results / Process Orders 0 []  - Staff telephones HHA, Nursing Homes / Clarify orders / etc 0 [name], [name] ([id number]) []  - Routine Transfer to another Facility (non-emergent condition) 0 []  - Routine Hospital Admission (non-emergent condition) 0 []  - New Admissions / Biomedical engineer / Ordering NPWT, Apligraf, etc. 0 []  - Emergency Hospital Admission (emergent condition) 0 X - Simple Discharge Coordination 1 10 []  - Complex (extensive) Discharge Coordination 0 PROCESS - Special Needs []  - Pediatric / Minor Patient Management 0 []  - Isolation Patient Management 0 []  - Hearing / Language / Visual special needs 0 []  - Assessment of Community assistance (transportation, D/C planning, etc.) 0 []  - Additional assistance / Altered mentation 0 []  - Support Surface(s) Assessment (bed, cushion, seat, etc.) 0 INTERVENTIONS - Wound Cleansing / Measurement []  - Simple Wound Cleansing - one wound 0 X -  Complex Wound Cleansing - multiple wounds 2 5 X - Wound Imaging (photographs - any number of wounds) 1 5 []  - Wound  Tracing (instead of photographs) 0 []  - Simple Wound Measurement - one wound 0 X - Complex Wound Measurement - multiple wounds 2 5 INTERVENTIONS - Wound Dressings X - Small Wound Dressing one or multiple wounds 2 10 []  - Medium Wound Dressing one or multiple wounds 0 []  - Large Wound Dressing one or multiple wounds 0 []  - Application of Medications - topical 0 []  - Application of Medications - injection 0 [name], [name] ([id number]) INTERVENTIONS - Miscellaneous []  - External ear exam 0 []  - Specimen Collection (cultures, biopsies, blood, body fluids, etc.) 0 []  - Specimen(s) / Culture(s) sent or taken to Lab for analysis 0 []  - Patient Transfer (multiple staff / Harrel Lemon Lift / Similar devices) 0 []  - Simple Staple / Suture removal (25 or less) 0 []  - Complex Staple / Suture removal (26 or more) 0 []  - Hypo / Hyperglycemic Management (close monitor of Blood Glucose) 0 []  - Ankle / Brachial Index (ABI) - do not check if billed separately 0 X - Vital Signs 1 5 Has the patient been seen at the hospital within the last three years: Yes Total Score: 100 Level Of Care: New/Established - Level 3 Electronic Signature(s) Signed: 12/31/2015 5:10:32 PM By: Montey Hora Entered By: Montey Hora on 12/31/2015 11:36:05 [name], [name] ([id number]) -------------------------------------------------------------------------------- Encounter Discharge Information Details Patient Name: [name], [name] Date of Service: 12/31/2015 10:45 AM Medical Record Patient Account Number: [id number] Number: Treating RN: Montey Hora 1940/12/27 (75 y.o. Other Clinician: Date of Birth/Sex: Female) Treating ROBSON, MICHAEL Primary Care Physician: Lavera [name] Physician/Extender: G Referring Physician: [name] in Treatment: 1 Encounter Discharge Information Items Discharge  Pain Level: 0 Discharge Condition: Stable Ambulatory Status: Walker Discharge Destination: Home Private Transportation: Auto Accompanied By: self Schedule Follow-up Appointment: Yes Medication Reconciliation completed and No provided to Patient/Care Reggie Welge: Patient Clinical Summary of Care: Declined Electronic Signature(s) Signed: 12/31/2015 12:08:34 PM By: Ruthine Dose Previous Signature: 12/31/2015 11:50:08 AM Version By: Montey Hora Entered By: Ruthine Dose on 12/31/2015 12:08:34 Sinopoli, Eily A. ([id number]) -------------------------------------------------------------------------------- Multi Wound Chart Details Patient Name: [name], [name] Date of Service: 12/31/2015 10:45 AM Medical Record Patient Account Number: [id number] Number: Treating RN: Montey Hora 02-19-1940 (75 y.o. Other Clinician: Date of Birth/Sex: Female) Treating ROBSON, MICHAEL Primary Care Physician: Lavera [name] Physician/Extender: G Referring Physician: [name] in Treatment: 1 Vital Signs Height(in): 62 Pulse(bpm): 79 Weight(lbs): 161 Blood Pressure 140/56 (mmHg): Body Mass Index(BMI): 29 Temperature(F): 98.1 Respiratory Rate 16 (breaths/min): Photos: [N/A:N/A] Wound Location: Chest - Midline, Proximal Chest - Midline, Distal N/A Wounding Event: Surgical Injury Surgical Injury N/A Primary Etiology: Open Surgical Wound Open Surgical Wound N/A Comorbid History: Cataracts, Anemia, Cataracts, Anemia, N/A Congestive Heart Failure, Congestive Heart Failure, Coronary Artery Disease, Coronary Artery Disease, Hypertension, Type II Hypertension, Type II Diabetes, Osteoarthritis, Diabetes, Osteoarthritis, Neuropathy, Received Neuropathy, Received Chemotherapy Chemotherapy Date Acquired: 11/11/2015 11/11/2015 N/A Weeks of Treatment: 1 0 N/A Wound Status: Open Open N/A Measurements L x W x D 1.3x0.3x0.1 0.7x0.5x2.1 N/A (cm) Area (cm) : 0.306 0.275 N/A Volume  (cm) : 0.031 0.577 N/A % Reduction in Area: 92.80% 0.00% N/A % Reduction in Volume: 99.00% 0.00% N/A Classification: N/A [name], [name] ([id number]) Full Thickness Without Full Thickness Without Exposed Support Exposed Support Structures Structures Exudate Amount: Large Large N/A Exudate Type: Serous Serous N/A Exudate Color: amber amber N/A Wound Margin: Flat and Intact Flat and Intact N/A Granulation Amount: Large (67-100%) Small (1-33%) N/A Granulation Quality: Red Red N/A Necrotic  Amount: Small (1-33%) Large (67-100%) N/A Necrotic Tissue: Eschar, Adherent Slough Eschar, Adherent Slough N/A Exposed Structures: Fascia: No Fascia: No N/A Fat: No Fat: No Tendon: No Tendon: No Muscle: No Muscle: No Joint: No Joint: No Bone: No Bone: No Limited to Skin Limited to Skin Breakdown Breakdown Epithelialization: None None N/A Periwound Skin Texture: Edema: Yes Edema: No N/A Excoriation: No Excoriation: No Induration: No Induration: No Callus: No Callus: No Crepitus: No Crepitus: No Fluctuance: No Fluctuance: No Friable: No Friable: No Rash: No Rash: No Scarring: No Scarring: No Periwound Skin Maceration: No Moist: Yes N/A Moisture: Moist: No Maceration: No Dry/Scaly: No Dry/Scaly: No Periwound Skin Color: Erythema: Yes Atrophie Blanche: No N/A Atrophie Blanche: No Cyanosis: No Cyanosis: No Ecchymosis: No Ecchymosis: No Erythema: No Hemosiderin Staining: No Hemosiderin Staining: No Mottled: No Mottled: No Pallor: No Pallor: No Rubor: No Rubor: No Erythema Location: Circumferential N/A N/A Temperature: No Abnormality No Abnormality N/A Tenderness on No No N/A Palpation: Wound Preparation: Ulcer Cleansing: Ulcer Cleansing: N/A Rinsed/Irrigated with Rinsed/Irrigated with Saline Saline Topical Anesthetic Topical Anesthetic [name], [name] ([id number]) Applied: Other: lidocaine Applied: Other: lidocaine 4% 4% Treatment Notes Wound #1 (Proximal,  Midline Chest) 1. Cleansed with: Clean wound with Normal Saline 2. Anesthetic Topical Lidocaine 4% cream to wound bed prior to debridement 4. Dressing Applied: Aquacel Ag 5. Secondary [place] Wound #2 (Distal, Midline Chest) 1. Cleansed with: Clean wound with Normal Saline 2. Anesthetic Topical Lidocaine 4% cream to wound bed prior to debridement 4. Dressing Applied: Aquacel Ag 5. Secondary Dressing Applied Dry [name] Signature(s) Signed: 12/31/2015 5:40:41 PM By: [name] MD Entered By: [name] on 12/31/2015 12:25:56 [name], [name] ([id number]) -------------------------------------------------------------------------------- [place] Details Patient Name: Schiff, Devoiry A. Date of Service: 12/31/2015 10:45 AM Medical Record Patient Account Number: [id number] Number: Treating RN: Montey Hora [birth year] (75 y.o. Other Clinician: Date of Birth/Sex: Female) Treating ROBSON, MICHAEL Primary Care Physician: Lavera [name] Physician/Extender: G Referring Physician: [name] in Treatment: 1 Active Inactive Abuse / Safety / Falls / Self Care Management Nursing Diagnoses: Impaired physical mobility Potential for falls Goals: Patient will remain injury free Date Initiated: 12/24/2015 Goal Status: Active Interventions: Assess fall risk on admission and as needed Notes: Orientation to the Wound Care Program Nursing Diagnoses: Knowledge deficit related to the wound healing center program Goals: Patient/caregiver will verbalize understanding of the [place] Program Date Initiated: 12/24/2015 Goal Status: Active Interventions: Provide education on orientation to the wound center Notes: Wound/Skin Impairment Nursing Diagnoses: Impaired tissue integrity Bozman, [name] ([id number]) Goals: Patient/caregiver will verbalize understanding of skin care  regimen Date Initiated: 12/24/2015 Goal Status: Active Ulcer/skin breakdown will have a volume reduction of 30% by week 4 Date Initiated: 12/24/2015 Goal Status: Active Ulcer/skin breakdown will have a volume reduction of 50% by week 8 Date Initiated: 12/24/2015 Goal Status: Active Ulcer/skin breakdown will have a volume reduction of 80% by week 12 Date Initiated: 12/24/2015 Goal Status: Active Ulcer/skin breakdown will heal within 14 weeks Date Initiated: 12/24/2015 Goal Status: Active Interventions: Assess patient/caregiver ability to obtain necessary supplies Assess patient/caregiver ability to perform ulcer/skin care regimen upon admission and as needed Assess ulceration(s) every visit Notes: Electronic Signature(s) Signed: 12/31/2015 5:10:32 PM By: Montey Hora Entered By: Montey Hora on 12/31/2015 11:31:53 [name], [name] ([id number]) -------------------------------------------------------------------------------- Pain Assessment Details Patient Name: [name], [name] Date of Service: 12/31/2015 10:45 AM Medical Record Patient Account Number: [id number] Number: Treating  RN: Montey Hora 14-Dec-1940 (75 y.o. Other Clinician: Date of Birth/Sex: Female) Treating ROBSON, MICHAEL Primary Care Physician: Lavera [name] Physician/Extender: G Referring Physician: [name] in Treatment: 1 Active Problems Location of Pain Severity and Description of Pain Patient Has Paino No Site Locations Pain Management and Medication Current Pain Management: Notes Topical or injectable lidocaine is offered to patient for acute pain when surgical debridement is performed. If needed, Patient is instructed to use over the counter pain medication for the following 24-48 hours after debridement. Wound care MDs do not prescribed pain medications. Patient has chronic pain or uncontrolled pain. Patient has been instructed to make an appointment with their Primary Care  Physician for pain management. Electronic Signature(s) Signed: 12/31/2015 5:10:32 PM By: Montey Hora Entered By: Montey Hora on 12/31/2015 11:01:36 [name], [name] ([id number]) -------------------------------------------------------------------------------- Patient/Caregiver Education Details Patient Name: Busbee, Alayah A. Date of Service: 12/31/2015 10:45 AM Medical Record Patient Account Number: [id number] Number: Treating RN: Montey Hora 09-Dec-1940 (75 y.o. Other Clinician: Date of Birth/Gender: Female) Treating ROBSON, MICHAEL Primary Care Physician: Lavera [name] Physician/Extender: G Referring Physician: [name] in Treatment: 1 Education Assessment Education Provided To: Patient Education Topics Provided Wound/Skin Impairment: Handouts: Other: wound care to continue as ordered Methods: Demonstration, Explain/Verbal Responses: State content correctly Electronic Signature(s) Signed: 12/31/2015 5:10:32 PM By: Montey Hora Entered By: Montey Hora on 12/31/2015 11:50:26 Wissner, Dineen A. ([id number]) -------------------------------------------------------------------------------- Wound Assessment Details Patient Name: Mattera, Shiana A. Date of Service: 12/31/2015 10:45 AM Medical Record Patient Account Number: [id number] Number: Treating RN: Montey Hora [birth year] (75 y.o. Other Clinician: Date of Birth/Sex: Female) Treating ROBSON, MICHAEL Primary Care Physician: Lavera [name] Physician/Extender: G Referring Physician: [name] in Treatment: 1 Wound Status Wound Number: 1 Primary Open Surgical Wound Etiology: Wound Location: Chest - Midline, Proximal Wound Open Wounding Event: Surgical Injury Status: Date Acquired: 11/11/2015 Comorbid Cataracts, Anemia, Congestive Heart Weeks Of Treatment: 1 History: Failure, Coronary Artery Disease, Clustered Wound: No Hypertension, Type II Diabetes, Osteoarthritis, Neuropathy,  Received Chemotherapy Photos Wound Measurements Length: (cm) 1.3 % Reduction in Ar Width: (cm) 0.3 % Reduction in Vo Depth: (cm) 0.1 Epithelialization Area: (cm) 0.306 Tunneling: Volume: (cm) 0.031 Undermining: ea: 92.8% lume: 99% : None No No Wound Description Full Thickness Without Exposed Foul Odor After Classification: Support Structures Wound Margin: Flat and Intact Exudate Large Amount: Exudate Type: Serous Exudate Color: amber Baskette, Freeda A. ([id number]) Cleansing: No Wound Bed Granulation Amount: Large (67-100%) Exposed Structure Granulation Quality: Red Fascia Exposed: No Necrotic Amount: Small (1-33%) Fat Layer Exposed: No Necrotic Quality: Eschar, Adherent Slough Tendon Exposed: No Muscle Exposed: No Joint Exposed: No Bone Exposed: No Limited to Skin Breakdown Periwound Skin Texture Texture Color No Abnormalities Noted: No No Abnormalities Noted: No Callus: No Atrophie Blanche: No Crepitus: No Cyanosis: No Excoriation: No Ecchymosis: No Fluctuance: No Erythema: Yes Friable: No Erythema Location: Circumferential Induration: No Hemosiderin Staining: No Localized Edema: Yes Mottled: No Rash: No Pallor: No Scarring: No Rubor: No Moisture Temperature / Pain No Abnormalities Noted: No Temperature: No Abnormality Dry / Scaly: No Maceration: No Moist: No Wound Preparation Ulcer Cleansing: Rinsed/Irrigated with Saline Topical Anesthetic Applied: Other: lidocaine 4%, Treatment Notes Wound #1 (Proximal, Midline Chest) 1. Cleansed with: Clean wound with Normal Saline 2. Anesthetic Topical Lidocaine 4% cream to wound bed prior to debridement 4. Dressing Applied: Aquacel Ag 5. Secondary Dressing Applied Dry [place] Signature(s) Signed: 12/31/2015 5:10:32 PM By: Maxwell Marion, Ayda A. ([id number]) Entered By: Marjory Lies  Joanna on 12/31/2015 11:12:24 Dolecki, [place]  ([id number]) -------------------------------------------------------------------------------- Wound Assessment Details Patient Name: Howson, Zayden A. Date of Service: 12/31/2015 10:45 AM Medical Record Patient Account Number: [id number] Number: Treating RN: Montey Hora 1940-04-01 (75 y.o. Other Clinician: Date of Birth/Sex: Female) Treating ROBSON, MICHAEL Primary Care Physician: Lavera [name] Physician/Extender: G Referring Physician: [name] in Treatment: 1 Wound Status Wound Number: 2 Primary Open Surgical Wound Etiology: Wound Location: Chest - Midline, Distal Wound Open Wounding Event: Surgical Injury Status: Date Acquired: 11/11/2015 Comorbid Cataracts, Anemia, Congestive Heart Weeks Of Treatment: 0 History: Failure, Coronary Artery Disease, Clustered Wound: No Hypertension, Type II Diabetes, Osteoarthritis, Neuropathy, Received Chemotherapy Photos Wound Measurements Length: (cm) 0.7 % Reduction in Ar Width: (cm) 0.5 % Reduction in Vo Depth: (cm) 2.1 Epithelialization Area: (cm) 0.275 Tunneling: Volume: (cm) 0.577 Undermining: ea: 0% lume: 0% : None No No Wound Description Full Thickness Without Exposed Foul Odor After Classification: Support Structures Wound Margin: Flat and Intact Exudate Large Amount: Exudate Type: Serous Exudate Color: amber Mccomas, Mileigh A. ([id number]) Cleansing: No Wound Bed Granulation Amount: Small (1-33%) Exposed Structure Granulation Quality: Red Fascia Exposed: No Necrotic Amount: Large (67-100%) Fat Layer Exposed: No Necrotic Quality: Eschar, Adherent Slough Tendon Exposed: No Muscle Exposed: No Joint Exposed: No Bone Exposed: No Limited to Skin Breakdown Periwound Skin Texture Texture Color No Abnormalities Noted: No No Abnormalities Noted: No Callus: No Atrophie Blanche: No Crepitus: No Cyanosis: No Excoriation: No Ecchymosis: No Fluctuance: No Erythema: No Friable:  No Hemosiderin Staining: No Induration: No Mottled: No Localized Edema: No Pallor: No Rash: No Rubor: No Scarring: No Temperature / Pain Moisture Temperature: No Abnormality No Abnormalities Noted: No Dry / Scaly: No Maceration: No Moist: Yes Wound Preparation Ulcer Cleansing: Rinsed/Irrigated with Saline Topical Anesthetic Applied: Other: lidocaine 4%, Treatment Notes Wound #2 (Distal, Midline Chest) 1. Cleansed with: Clean wound with Normal Saline 2. Anesthetic Topical Lidocaine 4% cream to wound bed prior to debridement 4. Dressing Applied: Aquacel Ag 5. Secondary Dressing Applied Dry [place] Signature(s) Signed: 12/31/2015 5:10:32 PM By: Maxwell Marion, Elijah A. ([id number]) Entered By: Montey Hora on 12/31/2015 11:13:15 Amason, Payton [name] Kitchen ([id number]) -------------------------------------------------------------------------------- Vitals Details Patient Name: Chui, Kandie A. Date of Service: 12/31/2015 10:45 AM Medical Record Patient Account Number: [id number] Number: Treating RN: Montey Hora [birth year] (75 y.o. Other Clinician: Date of Birth/Sex: Female) Treating ROBSON, MICHAEL Primary Care Physician: Lavera [name] Physician/Extender: G Referring Physician: [name] in Treatment: 1 Vital Signs Time Taken: 11:01 Temperature (F): 98.1 Height (in): 62 Pulse (bpm): 79 Weight (lbs): 161 Respiratory Rate (breaths/min): 16 Body Mass Index (BMI): 29.4 Blood Pressure (mmHg): 140/56 Reference Range: 80 - 120 mg / dl Electronic Signature(s) Signed: 12/31/2015 5:10:32 PM By: Montey Hora Entered By: Montey Hora on 12/31/2015 11:02:20

## 2016-01-01 NOTE — Progress Notes (Signed)
SEBA, MADOLE ([id number]) Visit Report for 12/31/2015 Chief Complaint Document Details Patient Name: [name], [name] Date of Service: 12/31/2015 10:45 AM Medical Record Patient Account Number: [id number] Number: Treating RN: [name] 23-Jun-1940 (75 y.o. Other Clinician: Date of Birth/Sex: Female) Treating [name] Primary Care Physician/Extender: [name] Physician: Referring Physician: Sallee [name] in Treatment: 1 Information Obtained from: Patient Chief Complaint 12/24/15 patient is here for review of to nonhealing surgical wounds in a CABG scar Electronic Signature(s) Signed: 12/31/2015 5:40:41 PM By: [name] Ham MD Entered By: [name] on 12/31/2015 12:26:57 [name], [name] ([id number]) -------------------------------------------------------------------------------- HPI Details Patient Name: Keplinger, Laneya A. Date of Service: 12/31/2015 10:45 AM Medical Record Patient Account Number: [id number] Number: Treating RN: [name] 30-May-1940 (75 y.o. Other Clinician: Date of Birth/Sex: Female) Treating [name] Primary Care Physician/Extender: [name] Physician: Referring Physician: Sallee [name] in Treatment: 1 History of Present Illness HPI Description: 12/24/15; [name] is a 75 year old woman who is the mother of one of our other clinic patient's [name] He comes [place] today for review a nonhealing surgical CABG wound. The patient underwent CABG o2 on October 31 17 at [facility]. She apparently had had a myocardial infarction in October but briefly presented with shortness of breath. She underwent surgery which I understand was uneventful. According to the patient her surgical incision never really healed in 2 spots. One is at the inferior recess question drain site, the other more superiorly about an inch from the superior surface of this it incision. She has been followed by her  surgeon at [facility] who felt that both areas which he'll according to the patient. She was put on Augmentin 2 weeks ago and she is finished a course of this culture at that time showed Escherichia coli which should've been sensitive. The patient also relates that she has a history of allergy to ampicillin. Besides this the patient has a history of type 2 diabetes on metformin, multiple myeloma. She had a chest x-ray in mid October the did not show any abnormalities. I cannot see that she had any specific views of the sternum however. We have her referral note from her primary physician Dr. Bernie [name]. She was concerned about erythema around the wound and as mentioned the culture here showed Escherichia coli which should have been Augmentin sensitive but was resistant to ampicillin, Keflex third generation cephalosporins quinolones and sulfonamides. [ESBL). The patient does not complain of fever or chills excessive pain. She does say that the lower surgical wound has been draining what looks to her like pus. She has been using triple antibiotic cream. 12/31/15 superior wound actually looks somewhat better. Inferiorly still a wound with some depth greater than 2 cm. Using Aquacel Ag. Culture I did of the drainage from the inferior wound and this surgical incision last week was negative Electronic Signature(s) Signed: 12/31/2015 5:40:41 PM By: [name] Ham MD Entered By: [name] on 12/31/2015 12:29:58 [name], [name] ([id number]) -------------------------------------------------------------------------------- Physical Exam Details Patient Name: Bruun, Kianna A. Date of Service: 12/31/2015 10:45 AM Medical Record Patient Account Number: [id number] Number: Treating RN: [name] May 09, 1940 (75 y.o. Other Clinician: Date of Birth/Sex: Female) Treating [name] Primary Care Physician/Extender: [name] Physician: Referring Physician: Sallee [name] in  Treatment: 1 Constitutional Sitting or standing Blood Pressure is within target range for patient.. Pulse regular and within target range for patient.Marland [name] Respirations regular, non-labored and within target range.. Temperature is normal and  within the target range for the patient.. Patient's appearance is neat and clean. Appears in no acute distress. Well nourished and well developed.. Notes Wound exam; the patient has 2 open areas in her surgical site. Inferiorly we are still looking out a small wound with probing depth no bone palpable at the bottom. Some drainage noted debridement required. Culture last week of this area was negative oThe second wound is more superiorly about an inch from the top of the surgical incision no debridement was required here either. This looks as though it is smaller than last week and contracting. Base of this is Physicist, medical) Signed: 12/31/2015 5:40:41 PM By: [name] Ham MD Entered By: [name] on 12/31/2015 12:29:27 [name], [name] ([id number]) -------------------------------------------------------------------------------- Physician Orders Details Patient Name: Gretzinger, Sadira A. Date of Service: 12/31/2015 10:45 AM Medical Record Patient Account Number: [id number] Number: Treating RN: [name] 02-23-1940 (75 y.o. Other Clinician: Date of Birth/Sex: Female) Treating [name] Primary Care Physician/Extender: [name] Physician: Referring Physician: Sallee [name] in Treatment: 1 Verbal / Phone Orders: Yes Clinician: Montey [name] Read Back and Verified: Yes Diagnosis Coding Wound Cleansing Wound #1 Proximal,Midline Chest o Clean wound with Normal Saline. o May Shower, gently pat wound dry prior to applying new dressing. Wound #2 Distal,Midline Chest o Clean wound with Normal Saline. o May Shower, gently pat wound dry prior to applying new dressing. Anesthetic Wound #1  Proximal,Midline Chest o Topical Lidocaine 4% cream applied to wound bed prior to debridement Wound #2 Distal,Midline Chest o Topical Lidocaine 4% cream applied to wound bed prior to debridement Primary Wound Dressing Wound #1 Proximal,Midline Chest o Aquacel Ag Wound #2 Distal,Midline Chest o Aquacel Ag Secondary Dressing Wound #1 Proximal,Midline Chest o Dry Gauze o Other - telfa island Wound #2 Distal,Midline Chest o Dry Gauze o Other - telfa island Dressing Change Frequency [name], [name] ([id number]) Wound #1 Proximal,Midline Chest o Change dressing every day. Wound #2 Distal,Midline Chest o Change dressing every day. Follow-up Appointments Wound #1 Proximal,Midline Chest o Return Appointment in 1 week. Wound #2 Distal,Midline Chest o Return Appointment in 1 week. Additional Orders / Instructions Wound #1 Proximal,Midline Chest o Increase protein intake. Wound #2 Distal,Midline Chest o Increase protein intake. Electronic Signature(s) Signed: 12/31/2015 5:10:32 PM By: [name] Signed: 12/31/2015 5:40:41 PM By: [name] Ham MD Entered By: [name] on 12/31/2015 11:35:34 [name], [name] ([id number]) -------------------------------------------------------------------------------- Problem List Details Patient Name: Epperly, Shanele A. Date of Service: 12/31/2015 10:45 AM Medical Record Patient Account Number: [id number] Number: Treating RN: [name] 1940/10/14 (75 y.o. 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 ([id number]) -------------------------------------------------------------------------------- Progress Note Details Patient Name: Yapp, Jericha A. Date of Service: 12/31/2015 10:45 AM Medical Record Patient Account Number: [id number] Number: Treating RN: [name] [birth year] (75 y.o. Other Clinician: Date of Birth/Sex: Female) Treating Kasiah Manka Primary Care Physician/Extender: [name] Physician: Referring Physician: Sallee [name] in Treatment: 1 Subjective Chief Complaint Information obtained from Patient 12/24/15 patient  is here for review of to nonhealing surgical wounds in a CABG scar History of Present Illness (HPI) 12/24/15; Mrs. Hegarty is a 75 year old woman who is the mother of one of our other clinic patient's [name] Wambolt. He comes [place] today for review a nonhealing surgical CABG wound. The patient underwent CABG o2 on October 31 17 at [facility]. She apparently had had a myocardial infarction in October but briefly presented with shortness of breath. She underwent surgery which I understand was uneventful. According to the patient her surgical incision never really healed in 2 spots. One is at the inferior recess question drain site, the other more superiorly about an inch from the superior surface of this it incision. She has been followed by her surgeon at [facility] who felt that both areas which he'll according to the patient. She was put on Augmentin 2 weeks ago and she is finished a course of this culture at that time showed Escherichia coli which should've been sensitive. The patient also relates that she has a history of allergy to ampicillin. Besides this the patient has a  history of type 2 diabetes on metformin, multiple myeloma. She had a chest x-ray in mid October the did not show any abnormalities. I cannot see that she had any specific views of the sternum however. We have her referral note from her primary physician Dr. Bernie [name]. She was concerned about erythema around the wound and as mentioned the culture here showed Escherichia coli which should have been Augmentin sensitive but was resistant to ampicillin, Keflex third generation cephalosporins quinolones and sulfonamides. [ESBL). The patient does not complain of fever or chills excessive pain. She does say that the lower surgical wound has been draining what looks to her like pus. She has been using triple antibiotic cream. 12/31/15 superior wound actually looks somewhat better. Inferiorly still a wound with some depth greater than 2 cm. Using Aquacel Ag. Culture I did of the drainage from the inferior wound and this surgical incision last week was negative Objective [name], [name] ([id number]) Constitutional Sitting or standing Blood Pressure is within target range for patient.. Pulse regular and within target range for patient.Marland [name] Respirations regular, non-labored and within target range.. Temperature is normal and within the target range for the patient.. Patient's appearance is neat and clean. Appears in no acute distress. Well nourished and well developed.. Vitals Time Taken: 11:01 AM, Height: 62 in, Weight: 161 lbs, BMI: 29.4, Temperature: 98.1 F, Pulse: 79 bpm, Respiratory Rate: 16 breaths/min, Blood Pressure: 140/56 mmHg. General Notes: Wound exam; the patient has 2 open areas in her surgical site. Inferiorly we are still looking out a small wound with probing depth no bone palpable at the bottom. Some drainage noted debridement required. Culture last week of this area was negative The second wound is more superiorly about an inch from the top of the surgical incision no debridement was  required here either. This looks as though it is smaller than last week and contracting. Base of this is healthy Integumentary (Hair, Skin) Wound #1 status is Open. Original cause of wound was Surgical Injury. The wound is located on the Proximal,Midline Chest. The wound measures 1.3cm length x 0.3cm width x 0.1cm depth; 0.306cm^2 area and 0.031cm^3 volume. The wound is limited to skin breakdown. There is no tunneling or undermining noted. There is a large amount of serous drainage noted. The wound margin is flat and intact. There is large (67-100%) red granulation within the wound bed. There is  a small (1-33%) amount of necrotic tissue within the wound bed including Eschar and Adherent Slough. The periwound skin appearance exhibited: Localized Edema, Erythema. The periwound skin appearance did not exhibit: Callus, Crepitus, Excoriation, Fluctuance, Friable, Induration, Rash, Scarring, Dry/Scaly, Maceration, Moist, Atrophie Blanche, Cyanosis, Ecchymosis, Hemosiderin Staining, Mottled, Pallor, Rubor. The surrounding wound skin color is noted with erythema which is circumferential. Periwound temperature was noted as No Abnormality. Wound #2 status is Open. Original cause of wound was Surgical Injury. The wound is located on the Distal,Midline Chest. The wound measures 0.7cm length x 0.5cm width x 2.1cm depth; 0.275cm^2 area and 0.577cm^3 volume. The wound is limited to skin breakdown. There is no tunneling or undermining noted. There is a large amount of serous drainage noted. The wound margin is flat and intact. There is small (1-33%) red granulation within the wound bed. There is a large (67-100%) amount of necrotic tissue within the wound bed including Eschar and Adherent Slough. The periwound skin appearance exhibited: Moist. The periwound skin appearance did not exhibit: Callus, Crepitus, Excoriation, Fluctuance, Friable, Induration, Localized Edema, Rash, Scarring, Dry/Scaly, Maceration,  Atrophie Blanche, Cyanosis, Ecchymosis, Hemosiderin Staining, Mottled, Pallor, Rubor, Erythema. Periwound temperature was noted as No Abnormality. Assessment Active Problems ICD-10 T81.31XD - Disruption of external operation (surgical) wound, not elsewhere classified, subsequent [name], [name] 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 May Shower, gently pat wound dry prior to applying new dressing. Wound #2 Distal,Midline Chest: Clean wound with Normal Saline. May Shower, gently pat wound dry prior to applying new dressing. Anesthetic: Wound #1 Proximal,Midline Chest: Topical Lidocaine 4% cream applied to wound bed prior to debridement Wound #2 Distal,Midline Chest: Topical Lidocaine 4% cream applied to wound bed prior to debridement Primary Wound Dressing: Wound #1 Proximal,Midline Chest: Aquacel Ag Wound #2 Distal,Midline Chest: Aquacel Ag Secondary Dressing: Wound #1 Proximal,Midline Chest: Dry Gauze Other - telfa island Wound #2 Distal,Midline Chest: Dry Gauze Other - telfa island Dressing Change Frequency: Wound #1 Proximal,Midline Chest: Change dressing every day. Wound #2 Distal,Midline Chest: Change dressing every day. Follow-up Appointments: Wound #1 Proximal,Midline Chest: Return Appointment in 1 week. Wound #2 Distal,Midline Chest: Return Appointment in 1 week. Additional Orders / Instructions: Wound #1 Proximal,Midline Chest: Increase protein intake. Wound #2 Distal,Midline Chest: Increase protein intake. [name], [name] ([id number]) Continue with Aquacel Ag ochange to prisma if the inferior wound does not show any reduction in wound dpeth Electronic Signature(s) Signed: 12/31/2015 5:40:41 PM By: [name] Ham MD Entered By: [name] on 12/31/2015  12:31:13 [name], [name] ([id number]) -------------------------------------------------------------------------------- SuperBill Details Patient Name: [name], [name] Date of Service: 12/31/2015 Medical Record Patient Account Number: [id number] Number: Treating RN: [name] [birth year] (75 y.o. 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

## 2016-01-06 ENCOUNTER — Encounter: Payer: Self-pay | Admitting: *Deleted

## 2016-01-06 ENCOUNTER — Ambulatory Visit

## 2016-01-06 DIAGNOSIS — Z951 Presence of aortocoronary bypass graft: Secondary | ICD-10-CM

## 2016-01-07 ENCOUNTER — Encounter: Payer: Medicare Other | Admitting: Internal Medicine

## 2016-01-07 ENCOUNTER — Other Ambulatory Visit
Admission: RE | Admit: 2016-01-07 | Discharge: 2016-01-07 | Disposition: A | Discharge status: Home / Self Care | Payer: Medicare Other | Source: Ambulatory Visit | Attending: Internal Medicine | Admitting: Internal Medicine

## 2016-01-07 DIAGNOSIS — L03313 Cellulitis of chest wall: Secondary | ICD-10-CM | POA: Diagnosis present

## 2016-01-07 DIAGNOSIS — Z48812 Encounter for surgical aftercare following surgery on the circulatory system: Secondary | ICD-10-CM | POA: Diagnosis not present

## 2016-01-08 ENCOUNTER — Ambulatory Visit

## 2016-01-08 NOTE — Progress Notes (Signed)
[name] ([id number]) Visit Report for 01/07/2016 Chief Complaint Document Details Patient Name: [name], [name] Date of Service: 01/07/2016 10:00 AM Medical Record Patient Account Number: [id number] Number: Treating RN: [name] 1940/02/27 (75 y.o. Other Clinician: Date of Birth/Sex: Female) Treating ,  Primary Care Physician/Extender: [name] Physician: Referring Physician: Sallee [name] in Treatment: 2 Information Obtained from: Patient Chief Complaint 12/24/15 patient is here for review of to nonhealing surgical wounds in [name] CABG scar Electronic Signature(s) Signed: 01/07/2016 5:22:07 PM By: [name] Ham [name] Entered By: [name] on 01/07/2016 11:25:36 [name], [name] ([id number]) -------------------------------------------------------------------------------- Debridement Details Patient Name: [name], [name] Date of Service: 01/07/2016 10:00 AM Medical Record Patient Account Number: [id number] Number: Treating RN: [name] 1940/04/19 (75 y.o. Other Clinician: Date of Birth/Sex: Female) Treating ,  Primary Care Physician/Extender: [name] Physician: Referring Physician: Sallee [name] in Treatment: 2 Debridement Performed for Wound #2 Distal,Midline Chest Assessment: Performed By: Physician [name] Debridement: Debridement Pre-procedure Yes - 10:48 Verification/Time Out Taken: Start Time: 10:48 Pain Control: Lidocaine 4% Topical Solution Level: Skin/Subcutaneous Tissue Total Area Debrided (L x 0.7 (cm) x 0.8 (cm) = 0.56 (cm) W): Tissue and other Viable, Non-Viable, Eschar, Fibrin/Slough, Subcutaneous material debrided: Instrument: Curette Specimen: Swab Number of Specimens 1 Taken: Bleeding: Minimum Hemostasis Achieved: Pressure End Time: 10:53 Procedural Pain: 0 Post Procedural Pain: 0 Response to Treatment: Procedure was tolerated well Post  Debridement Measurements of Total Wound Length: (cm) 0.7 Width: (cm) 0.8 Depth: (cm) 1.8 Volume: (cm) 0.792 Character of Wound/Ulcer Post Improved Debridement: Severity of Tissue Post Debridement: Fat layer exposed Post Procedure Diagnosis Same as Pre-procedure [name], [name] ([id number]) Electronic Signature(s) Signed: 01/07/2016 5:22:07 PM By: [name] Ham [name] Signed: 01/07/2016 5:43:35 PM By: [name] Entered By: [name] on 01/07/2016 11:25:18 [name], [name] ([id number]) -------------------------------------------------------------------------------- HPI Details Patient Name: [name], [name] Date of Service: 01/07/2016 10:00 AM Medical Record Patient Account Number: [id number] Number: Treating RN: [name] 1940/09/03 (75 y.o. Other Clinician: Date of Birth/Sex: Female) Treating ,  Primary Care Physician/Extender: [name] Physician: Referring Physician: Sallee [name] in Treatment: 2 History of Present Illness HPI Description: 12/24/15; [name] is [name] 75 year old woman who is the mother of one of our other clinic patient's [name] He comes [place] today for review [name] nonhealing surgical CABG wound. 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 She apparently had had [name] myocardial infarction in October but briefly presented with shortness of breath. She underwent surgery which I understand was uneventful. According to the patient her surgical incision never really healed in 2 spots. One is at the inferior recess question drain site, the other more superiorly about an inch from the superior surface of this it incision. She has been followed by her surgeon at [facility] who felt that both areas which he'll according to the patient. She was put on Augmentin 2 weeks ago and she is finished [name] course of this culture at that time showed Escherichia coli which should've been sensitive. The patient also  relates that she has [name] history of allergy to ampicillin. Besides this the patient has [name] history of type 2 diabetes on metformin, multiple myeloma. She had [name] chest x-ray in mid October the did not show any abnormalities. I cannot see that she had any specific views of the sternum however. We have her referral note from her primary physician [name] She was  concerned about erythema around the wound and as mentioned the culture here showed Escherichia coli which should have been Augmentin sensitive but was resistant to ampicillin, Keflex third generation cephalosporins quinolones and sulfonamides. [ESBL). The patient does not complain of fever or chills excessive pain. She does say that the lower surgical wound has been draining what looks to her like pus. She has been using triple antibiotic cream. 12/31/15 superior wound actually looks somewhat better. Inferiorly still [name] wound with some depth greater than 74 cm. Using Aquacel Ag. Culture I did of the drainage from the inferior wound and this surgical incision last week was negative 01/07/16; superior wound still requires debridement of surface eschar and nonviable subcutaneous tissue. Inferior wound measured at 1.8 cm. What I can see of the tissue here does not look very healthy. Original culture that I did on her presentation was negative however Electronic Signature(s) Signed: 01/07/2016 5:22:07 PM By: [name] Ham [name] Entered By: [name] on 01/07/2016 11:26:24 [name], [name] ([id number]) -------------------------------------------------------------------------------- Physical Exam Details Patient Name: [name] Date of Service: 01/07/2016 10:00 AM Medical Record Patient Account Number: [id number] Number: Treating RN: [name] 1940-10-30 (75 y.o. Other Clinician: Date of Birth/Sex: Female) Treating [name] Primary Care Physician/Extender: [name] Physician: Referring  Physician: Sallee [name] in Treatment: 2 Constitutional Sitting or standing Blood Pressure is within target range for patient.. Pulse regular and within target range for patient.Marland Kitchen Respirations regular, non-labored and within target range.. Temperature is normal and within the target range for the patient.. Notes Wound exam; the patient has 2 open areas at her surgical site superiorly this was covered with eschar and nonviable subcutaneous tissue which I debrided with [name] #3 curette no bleeding was cauterized to. Patient tolerated this well. The more concerning area continues to be the deeper hole over her xiphoid process. She does not probe to bone however the tissue does not look healthy. I've gone ahead and recultured this. Electronic Signature(s) Signed: 01/07/2016 5:22:07 PM By: [name] Ham [name] Entered By: [name] on 01/07/2016 11:29:03 [name], [name] ([id number]) -------------------------------------------------------------------------------- Physician Orders Details Patient Name: [name], [name] Date of Service: 01/07/2016 10:00 AM Medical Record Patient Account Number: [id number] Number: Treating RN: [name] 02-Aug-1940 (75 y.o. Other Clinician: Date of Birth/Sex: Female) Treating Jonica Bickhart Primary Care Physician/Extender: [name] Physician: Referring Physician: Sallee [name] in Treatment: 2 Verbal / Phone Orders: Yes Clinician: Montey [name] Read Back and Verified: Yes Diagnosis Coding Wound Cleansing Wound #1 Proximal,Midline Chest o Clean wound with Normal Saline. o May Shower, gently pat wound dry prior to applying new dressing. Wound #2 Distal,Midline Chest o Clean wound with Normal Saline. o May Shower, gently pat wound dry prior to applying new dressing. Anesthetic Wound #1 Proximal,Midline Chest o Topical Lidocaine 4% cream applied to wound bed prior to debridement Wound #2 Distal,Midline Chest o  Topical Lidocaine 4% cream applied to wound bed prior to debridement Primary Wound Dressing Wound #1 Proximal,Midline Chest o Aquacel Ag Wound #2 Distal,Midline Chest o Aquacel Ag Secondary Dressing Wound #1 Proximal,Midline Chest o Dry Gauze o Other - telfa island Wound #2 Distal,Midline Chest o Dry Gauze o Other - telfa island Dressing Change Frequency Knarr, Kelley [name]. ([id number]) Wound #1 Proximal,Midline Chest o Change dressing every day. Wound #2 Distal,Midline Chest o Change dressing every day. Follow-up Appointments Wound #1 Proximal,Midline Chest o Return Appointment in 1 week. Wound #2 Distal,Midline Chest o Return Appointment in 1 week. Additional Orders /  Instructions Wound #1 Proximal,Midline Chest o Increase protein intake. Wound #2 Distal,Midline Chest o Increase protein intake. Laboratory o Bacteria identified in Wound by Culture (MICRO) - distal midline chest oooo LOINC Code: 8416-6 oooo Convenience Name: Wound culture routine Electronic Signature(s) Signed: 01/07/2016 5:22:07 PM By: [name] Ham [name] Signed: 01/07/2016 5:43:35 PM By: [name] Entered By: [name] on 01/07/2016 11:07:53 [name], [name] ([id number]) -------------------------------------------------------------------------------- Problem List Details Patient Name: Babich, Maycee [name]. Date of Service: 01/07/2016 10:00 AM Medical Record Patient Account Number: [id number] Number: Treating RN: [name] 1940/09/06 (75 y.o. 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 ([id number]) -------------------------------------------------------------------------------- Progress Note Details Patient Name: [name], [name] Date of Service: 01/07/2016 10:00 AM Medical Record Patient Account Number: [id number] Number: Treating RN: [name] 1940-03-11 (75 y.o. Other Clinician: Date of Birth/Sex: Female) Treating Odeth Bry Primary Care Physician/Extender: [name] Physician: Referring Physician: Sallee [name] in Treatment: 2 Subjective Chief Complaint Information obtained from Patient 12/24/15 patient is here for review of to nonhealing surgical wounds in [name] CABG scar History of Present Illness (HPI) 12/24/15; Mrs. Feliz is [name] 75 year old woman who is the mother of one of our other clinic patient's [name] Ramone. He comes [place] today for review [name] nonhealing surgical CABG wound. 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 She apparently had had [name] myocardial infarction in October but briefly presented with shortness of breath. She underwent surgery which I understand was uneventful. According to the patient her surgical incision never really healed in 2 spots. One is at the inferior recess question drain site, the other more superiorly about an inch from the superior surface of this it incision. She has been followed by her surgeon at [facility] who felt that both areas which he'll according to the patient. She was put on Augmentin 2 weeks ago and she is finished [name] course of this culture at that time showed Escherichia coli which should've been sensitive. The patient also relates that she has [name] history of allergy to ampicillin. Besides  this the patient has [name] history of type 2 diabetes on metformin, multiple myeloma. She had [name] chest x-ray in mid October the did not show any abnormalities. I cannot see that she had any specific views of the sternum however. We have her referral note from her primary physician [name] She was concerned about erythema around the wound and as mentioned the culture here showed Escherichia coli which should have been Augmentin sensitive but was resistant to ampicillin, Keflex third generation cephalosporins quinolones and sulfonamides. [ESBL). The patient does not complain of fever or chills excessive pain. She does say that the lower surgical wound has been draining what looks to her like pus. She has been using triple antibiotic cream. 12/31/15 superior wound actually looks somewhat better. Inferiorly still [name] wound with some depth greater than 74 cm. Using Aquacel Ag. Culture I did of the drainage from the inferior wound and this surgical incision last week  was negative 01/07/16; superior wound still requires debridement of surface eschar and nonviable subcutaneous tissue. Inferior wound measured at 1.8 cm. What I can see of the tissue here does not look very healthy. Original culture that I did on her presentation was negative however [name], [name] ([id number]) Objective Constitutional Sitting or standing Blood Pressure is within target range for patient.. Pulse regular and within target range for patient.Marland Kitchen Respirations regular, non-labored and within target range.. Temperature is normal and within the target range for the patient.. Vitals Time Taken: 10:08 AM, Height: 62 in, Weight: 161 lbs, BMI: 29.4, Temperature: 98.4 F, Pulse: 87 bpm, Respiratory Rate: 18 breaths/min, Blood Pressure: 140/56 mmHg. General Notes: Wound exam; the patient has 2 open areas at her surgical site superiorly this was covered with eschar and nonviable subcutaneous tissue which I debrided with [name] #3  curette no bleeding was cauterized to. Patient tolerated this well. The more concerning area continues to be the deeper hole over her xiphoid process. She does not probe to bone however the tissue does not look healthy. I've gone ahead and recultured this. Integumentary (Hair, Skin) Wound #1 status is Open. Original cause of wound was Surgical Injury. The wound is located on the Proximal,Midline Chest. The wound measures 1cm length x 0.2cm width x 0.1cm depth; 0.157cm^2 area and 0.016cm^3 volume. The wound is limited to skin breakdown. There is no tunneling or undermining noted. There is [name] large amount of serous drainage noted. The wound margin is flat and intact. There is large (67-100%) red granulation within the wound bed. There is [name] small (1-33%) amount of necrotic tissue within the wound bed including Eschar and Adherent Slough. The periwound skin appearance exhibited: Localized Edema, Erythema. The periwound skin appearance did not exhibit: Callus, Crepitus, Excoriation, Fluctuance, Friable, Induration, Rash, Scarring, Dry/Scaly, Maceration, Moist, Atrophie Blanche, Cyanosis, Ecchymosis, Hemosiderin Staining, Mottled, Pallor, Rubor. The surrounding wound skin color is noted with erythema which is circumferential. Periwound temperature was noted as No Abnormality. Wound #2 status is Open. Original cause of wound was Surgical Injury. The wound is located on the Distal,Midline Chest. The wound measures 0.7cm length x 0.8cm width x 1.8cm depth; 0.44cm^2 area and 0.792cm^3 volume. The wound is limited to skin breakdown. There is no tunneling or undermining noted. There is [name] large amount of sanguinous drainage noted. The wound margin is flat and intact. There is large (67-100%) red granulation within the wound bed. There is [name] small (1-33%) amount of necrotic tissue within the wound bed including Eschar and Adherent Slough. The periwound skin appearance exhibited: Moist. The periwound skin  appearance did not exhibit: Callus, Crepitus, Excoriation, Fluctuance, Friable, Induration, Localized Edema, Rash, Scarring, Dry/Scaly, Maceration, Atrophie Blanche, Cyanosis, Ecchymosis, Hemosiderin Staining, Mottled, Pallor, Rubor, Erythema. Periwound temperature was noted as No Abnormality. Assessment Schewe, Janila [name]. 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 There was [name] Skin/Subcutaneous Tissue Debridement ([id number]) debridement with total area of 0.56 sq cm performed by [name] with the following instrument(s): Curette to remove Viable and Non-Viable tissue/material including Fibrin/Slough, Eschar, and Subcutaneous after achieving pain control using Lidocaine 4% Topical Solution. 1 Specimen was taken by [name] Swab and sent to the lab per facility protocol.[name] time out was conducted at 10:48, prior  to the start of the procedure. [name] Minimum amount of bleeding was controlled with Pressure. The procedure was tolerated well with [name] pain level of 0 throughout and [name] pain level of 0 following the procedure. Post Debridement Measurements: 0.7cm length x 0.8cm width x 1.8cm depth; 0.792cm^3 volume. Character of Wound/Ulcer Post Debridement is improved. Severity of Tissue Post Debridement is: Fat layer exposed. Post procedure Diagnosis Wound #2: Same as Pre-Procedure Plan Wound Cleansing: Wound #1 Proximal,Midline Chest: Clean wound with Normal Saline. May Shower, gently pat wound dry prior to applying new dressing. Wound #2 Distal,Midline Chest: Clean wound with Normal Saline. May Shower, gently pat wound dry prior to applying new dressing. Anesthetic: Wound #1  Proximal,Midline Chest: Topical Lidocaine 4% cream applied to wound bed prior to debridement Wound #2 Distal,Midline Chest: Topical Lidocaine 4% cream applied to wound bed prior to debridement Primary Wound Dressing: Wound #1 Proximal,Midline Chest: LASHELLE, KOY [name]. ([id number]) Aquacel Ag Wound #2 Distal,Midline Chest: Aquacel Ag Secondary Dressing: Wound #1 Proximal,Midline Chest: Dry Gauze Other - telfa island Wound #2 Distal,Midline Chest: Dry Gauze Other - telfa island Dressing Change Frequency: Wound #1 Proximal,Midline Chest: Change dressing every day. Wound #2 Distal,Midline Chest: Change dressing every day. Follow-up Appointments: Wound #1 Proximal,Midline Chest: Return Appointment in 1 week. Wound #2 Distal,Midline Chest: Return Appointment in 1 week. Additional Orders / Instructions: Wound #1 Proximal,Midline Chest: Increase protein intake. Wound #2 Distal,Midline Chest: Increase protein intake. Laboratory ordered were: Wound culture routine - distal midline chest we continued with silver alginate await the second culture from the inferior wound I removed [name] single suture from her right calf, vein harvest site Electronic Signature(s) Signed: 01/07/2016 5:22:07 PM By: [name] Ham [name] Entered By: [name] on 01/07/2016 11:32:08 Looney, Bina [name]. ([id number]) -------------------------------------------------------------------------------- SuperBill Details Patient Name: Austin, Nyima [name]. Date of Service: 01/07/2016 Medical Record Patient Account Number: [id number] Number: Treating RN: [name] [birth year] (75 y.o. 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 Facility Procedures CPT4: Description Modifier Quantity Code [id number] 11042 - DEB SUBQ TISSUE 20 SQ CM/< 1 ICD-10 Description Diagnosis T81.31XD Disruption of external operation (surgical) wound, not elsewhere classified, subsequent encounter L98.498 Non-pressure chronic  ulcer of other sites with other specified severity L03.313 Cellulitis of chest wall Physician Procedures CPT4: Description Modifier Quantity Code [id number] 75643 - WC PHYS SUBQ TISS 20 SQ CM 1 ICD-10 Description Diagnosis T81.31XD Disruption of external operation (surgical) wound, not elsewhere classified, subsequent encounter L98.498 Non-pressure chronic  ulcer of other sites with other specified severity L03.313 Cellulitis of chest wall Electronic Signature(s) Signed: 01/07/2016 5:22:07 PM By: [name] Ham [name] Entered By: [name] on 01/07/2016 11:32:44

## 2016-01-08 NOTE — Progress Notes (Signed)
[name] ([id number]) Visit Report for 01/07/2016 Arrival Information Details Patient Name: [name], [name] Date of Service: 01/07/2016 10:00 AM Medical Record Patient Account Number: [id number] Number: Treating RN: Montey Hora 11/29/1940 (75 y.o. Other Clinician: Date of Birth/Sex: Female) Treating ROBSON, MICHAEL Primary Care Physician: Lavera [name] Physician/Extender: G Referring Physician: [name] in Treatment: 2 Visit Information History Since Last Visit Added or deleted any medications: No Patient Arrived: Walker Any new allergies or adverse reactions: No Arrival Time: 10:07 Had a fall or experienced change in No Accompanied By: self activities of daily living that may affect Transfer Assistance: None risk of falls: Patient Identification Verified: Yes Signs or symptoms of abuse/neglect since last No Secondary Verification Process Completed: Yes visito Patient Has Alerts: Yes Hospitalized since last visit: No Patient Alerts: DMII Pain Present Now: No Electronic Signature(s) Signed: 01/07/2016 5:43:35 PM By: Montey Hora Entered By: Montey Hora on 01/07/2016 10:07:50 Sportsman, [name] ([id number]) -------------------------------------------------------------------------------- Encounter Discharge Information Details Patient Name: Brodowski, [name] Date of Service: 01/07/2016 10:00 AM Medical Record Patient Account Number: [id number] Number: Treating RN: Montey Hora 1940/08/20 (75 y.o. Other Clinician: Date of Birth/Sex: Female) Treating ROBSON, MICHAEL Primary Care Physician: Lavera [name] Physician/Extender: G Referring Physician: [name] in Treatment: 2 Encounter Discharge Information Items Discharge Pain Level: 0 Discharge Condition: Stable Ambulatory Status: Walker Discharge Destination: Home Transportation: Private Auto Accompanied By: self Schedule Follow-up Appointment: Yes Medication Reconciliation  completed and provided to Patient/Care No Macrae Wiegman: Provided on Clinical Summary of Care: 01/07/2016 Form Type Recipient Paper Patient AW Electronic Signature(s) Signed: 01/07/2016 12:22:29 PM By: Montey Hora Previous Signature: 01/07/2016 11:08:56 AM Version By: Ruthine Dose Entered By: Montey Hora on 01/07/2016 12:22:29 Brule, Jacqulene A. ([id number]) -------------------------------------------------------------------------------- Multi Wound Chart Details Patient Name: Malkiewicz, [name] Date of Service: 01/07/2016 10:00 AM Medical Record Patient Account Number: [id number] Number: Treating RN: Montey Hora [birth year] (75 y.o. Other Clinician: Date of Birth/Sex: Female) Treating ROBSON, MICHAEL Primary Care Physician: Lavera [name] Physician/Extender: G Referring Physician: [name] in Treatment: 2 Vital Signs Height(in): 62 Pulse(bpm): 87 Weight(lbs): 161 Blood Pressure 140/56 (mmHg): Body Mass Index(BMI): 29 Temperature(F): 98.4 Respiratory Rate 18 (breaths/min): Photos: [N/A:N/A] Wound Location: Chest - Midline, Proximal Chest - Midline, Distal N/A Wounding Event: Surgical Injury Surgical Injury N/A Primary Etiology: Open Surgical Wound Open Surgical Wound N/A Comorbid History: Cataracts, Anemia, Cataracts, Anemia, N/A Congestive Heart Failure, Congestive Heart Failure, Coronary Artery Disease, Coronary Artery Disease, Hypertension, Type II Hypertension, Type II Diabetes, Osteoarthritis, Diabetes, Osteoarthritis, Neuropathy, Received Neuropathy, Received Chemotherapy Chemotherapy Date Acquired: 11/11/2015 11/11/2015 N/A Weeks of Treatment: 2 1 N/A Wound Status: Open Open N/A Measurements L x W x D 1x0.2x0.1 0.7x0.8x1.8 N/A (cm) Area (cm) : 0.157 0.44 N/A Volume (cm) : 0.016 0.792 N/A % Reduction in Area: 96.30% -60.00% N/A % Reduction in Volume: 99.50% -37.30% N/A Classification: N/A [name], [name] ([id number]) Full Thickness  Without Full Thickness Without Exposed Support Exposed Support Structures Structures Exudate Amount: Large Large N/A Exudate Type: Serous Sanguinous N/A Exudate Color: amber red N/A Wound Margin: Flat and Intact Flat and Intact N/A Granulation Amount: Large (67-100%) Large (67-100%) N/A Granulation Quality: Red Red N/A Necrotic Amount: Small (1-33%) Small (1-33%) N/A Necrotic Tissue: Eschar, Adherent Slough Eschar, Adherent Slough N/A Exposed Structures: Fascia: No Fascia: No N/A Fat: No Fat: No Tendon: No Tendon: No Muscle: No Muscle: No Joint: No Joint: No Bone: No Bone: No Limited to Skin Limited to Skin Breakdown Breakdown Epithelialization: None None  N/A Debridement: N/A Debridement [id number]) Pre-procedure N/A 10:48 N/A Verification/Time Out Taken: Pain Control: N/A Lidocaine 4% Topical N/A Solution Tissue Debrided: N/A Necrotic/Eschar, N/A Fibrin/Slough, Subcutaneous Level: N/A Skin/Subcutaneous N/A Tissue Debridement Area (sq N/A 0.56 N/A cm): Instrument: N/A Curette N/A Specimen: N/A Swab N/A Number of Specimens N/A 1 N/A Taken: Bleeding: N/A Minimum N/A Hemostasis Achieved: N/A Pressure N/A Procedural Pain: N/A 0 N/A Post Procedural Pain: N/A 0 N/A Debridement Treatment N/A Procedure was tolerated N/A Response: well Post Debridement N/A 0.7x0.8x1.8 N/A Measurements L x W x D (cm) Post Debridement N/A 0.792 N/A Volume: (cm) [name], [name] ([id number]) Periwound Skin Texture: Edema: Yes Edema: No N/A Excoriation: No Excoriation: No Induration: No Induration: No Callus: No Callus: No Crepitus: No Crepitus: No Fluctuance: No Fluctuance: No Friable: No Friable: No Rash: No Rash: No Scarring: No Scarring: No Periwound Skin Maceration: No Moist: Yes N/A Moisture: Moist: No Maceration: No Dry/Scaly: No Dry/Scaly: No Periwound Skin Color: Erythema: Yes Atrophie Blanche: No N/A Atrophie Blanche: No Cyanosis: No Cyanosis:  No Ecchymosis: No Ecchymosis: No Erythema: No Hemosiderin Staining: No Hemosiderin Staining: No Mottled: No Mottled: No Pallor: No Pallor: No Rubor: No Rubor: No Erythema Location: Circumferential N/A N/A Temperature: No Abnormality No Abnormality N/A Tenderness on No No N/A Palpation: Wound Preparation: Ulcer Cleansing: Ulcer Cleansing: N/A Rinsed/Irrigated with Rinsed/Irrigated with Saline Saline Topical Anesthetic Topical Anesthetic Applied: Other: lidocaine Applied: Other: lidocaine 4% 4% Procedures Performed: N/A Debridement N/A Treatment Notes Electronic Signature(s) Signed: 01/07/2016 5:22:07 PM By: [name] MD Entered By: [name] on 01/07/2016 11:25:01 Forti, Matina AMarland Kitchen ([id number]) -------------------------------------------------------------------------------- [place] Details Patient Name: Adduci, Adylynn A. Date of Service: 01/07/2016 10:00 AM Medical Record Patient Account Number: [id number] Number: Treating RN: Montey Hora 11-09-1940 (75 y.o. Other Clinician: Date of Birth/Sex: Female) Treating ROBSON, MICHAEL Primary Care Physician: Lavera [name] Physician/Extender: G Referring Physician: [name] in Treatment: 2 Active Inactive Abuse / Safety / Falls / Self Care Management Nursing Diagnoses: Impaired physical mobility Potential for falls Goals: Patient will remain injury free Date Initiated: 12/24/2015 Goal Status: Active Interventions: Assess fall risk on admission and as needed Notes: Orientation to the Wound Care Program Nursing Diagnoses: Knowledge deficit related to the wound healing center program Goals: Patient/caregiver will verbalize understanding of the [facility] Date Initiated: 12/24/2015 Goal Status: Active Interventions: Provide education on orientation to the wound center Notes: Wound/Skin Impairment Nursing Diagnoses: Impaired tissue integrity Schiano,  Leiya A. ([id number]) Goals: Patient/caregiver will verbalize understanding of skin care regimen Date Initiated: 12/24/2015 Goal Status: Active Ulcer/skin breakdown will have a volume reduction of 30% by week 4 Date Initiated: 12/24/2015 Goal Status: Active Ulcer/skin breakdown will have a volume reduction of 50% by week 8 Date Initiated: 12/24/2015 Goal Status: Active Ulcer/skin breakdown will have a volume reduction of 80% by week 12 Date Initiated: 12/24/2015 Goal Status: Active Ulcer/skin breakdown will heal within 14 weeks Date Initiated: 12/24/2015 Goal Status: Active Interventions: Assess patient/caregiver ability to obtain necessary supplies Assess patient/caregiver ability to perform ulcer/skin care regimen upon admission and as needed Assess ulceration(s) every visit Notes: Electronic Signature(s) Signed: 01/07/2016 5:43:35 PM By: Montey Hora Entered By: Montey Hora on 01/07/2016 10:55:06 Seabolt, Haila A. ([id number]) -------------------------------------------------------------------------------- Pain Assessment Details Patient Name: Budzik, Thersea A. Date of Service: 01/07/2016 10:00 AM Medical Record Patient Account Number: [id number] Number: Treating RN: Montey Hora Oct 12, 1940 (75 y.o. Other Clinician: Date of Birth/Sex: Female) Treating ROBSON, MICHAEL Primary Care Physician: Lavera [name] Physician/Extender:  G Referring Physician: [name] in Treatment: 2 Active Problems Location of Pain Severity and Description of Pain Patient Has Paino No Site Locations Pain Management and Medication Current Pain Management: Notes Topical or injectable lidocaine is offered to patient for acute pain when surgical debridement is performed. If needed, Patient is instructed to use over the counter pain medication for the following 24-48 hours after debridement. Wound care MDs do not prescribed pain medications. Patient has chronic pain or  uncontrolled pain. Patient has been instructed to make an appointment with their Primary Care Physician for pain management. Electronic Signature(s) Signed: 01/07/2016 5:43:35 PM By: Montey Hora Entered By: Montey Hora on 01/07/2016 10:07:58 Harnisch, [name] ([id number]) -------------------------------------------------------------------------------- Patient/Caregiver Education Details Patient Name: Bowmer, Jaidalyn A. Date of Service: 01/07/2016 10:00 AM Medical Record Patient Account Number: [id number] Number: Treating RN: Montey Hora 09-Nov-1940 (75 y.o. Other Clinician: Date of Birth/Gender: Female) Treating ROBSON, MICHAEL Primary Care Physician: Lavera [name] Physician/Extender: G Referring Physician: [name] in Treatment: 2 Education Assessment Education Provided To: Patient Education Topics Provided Wound/Skin Impairment: Handouts: Other: wound care to continue as ordered Methods: Demonstration, Explain/Verbal Responses: State content correctly Electronic Signature(s) Signed: 01/07/2016 5:43:35 PM By: Montey Hora Entered By: Montey Hora on 01/07/2016 12:22:53 Jasko, Thea A. ([id number]) -------------------------------------------------------------------------------- Wound Assessment Details Patient Name: Goldinger, Cambrey A. Date of Service: 01/07/2016 10:00 AM Medical Record Patient Account Number: [id number] Number: Treating RN: Montey Hora 15-Jan-1940 (75 y.o. Other Clinician: Date of Birth/Sex: Female) Treating ROBSON, MICHAEL Primary Care Physician: Lavera [name] Physician/Extender: G Referring Physician: [name] in Treatment: 2 Wound Status Wound Number: 1 Primary Open Surgical Wound Etiology: Wound Location: Chest - Midline, Proximal Wound Open Wounding Event: Surgical Injury Status: Date Acquired: 11/11/2015 Comorbid Cataracts, Anemia, Congestive Heart Weeks Of Treatment: 2 History: Failure, Coronary  Artery Disease, Clustered Wound: No Hypertension, Type II Diabetes, Osteoarthritis, Neuropathy, Received Chemotherapy Photos Wound Measurements Length: (cm) 1 % Reduction in Ar Width: (cm) 0.2 % Reduction in Vo Depth: (cm) 0.1 Epithelialization Area: (cm) 0.157 Tunneling: Volume: (cm) 0.016 Undermining: ea: 96.3% lume: 99.5% : None No No Wound Description Full Thickness Without Exposed Foul Odor After Classification: Support Structures Wound Margin: Flat and Intact Exudate Large Amount: Exudate Type: Serous Exudate Color: amber Finnie, Leaira A. ([id number]) Cleansing: No Wound Bed Granulation Amount: Large (67-100%) Exposed Structure Granulation Quality: Red Fascia Exposed: No Necrotic Amount: Small (1-33%) Fat Layer Exposed: No Necrotic Quality: Eschar, Adherent Slough Tendon Exposed: No Muscle Exposed: No Joint Exposed: No Bone Exposed: No Limited to Skin Breakdown Periwound Skin Texture Texture Color No Abnormalities Noted: No No Abnormalities Noted: No Callus: No Atrophie Blanche: No Crepitus: No Cyanosis: No Excoriation: No Ecchymosis: No Fluctuance: No Erythema: Yes Friable: No Erythema Location: Circumferential Induration: No Hemosiderin Staining: No Localized Edema: Yes Mottled: No Rash: No Pallor: No Scarring: No Rubor: No Moisture Temperature / Pain No Abnormalities Noted: No Temperature: No Abnormality Dry / Scaly: No Maceration: No Moist: No Wound Preparation Ulcer Cleansing: Rinsed/Irrigated with Saline Topical Anesthetic Applied: Other: lidocaine 4%, Treatment Notes Wound #1 (Proximal, Midline Chest) 1. Cleansed with: Clean wound with Normal Saline 2. Anesthetic Topical Lidocaine 4% cream to wound bed prior to debridement 4. Dressing Applied: Aquacel Ag 5. Secondary Dressing Applied Dry [place] [facility] Signature(s) Signed: 01/07/2016 5:43:35 PM By: Maxwell Marion, Zyaire A. ([id number]) Entered  By: Montey Hora on 01/07/2016 10:20:03 Flenniken, Shevelle A. ([id number]) -------------------------------------------------------------------------------- Wound Assessment Details Patient Name: Slawinski, Janus A. Date of Service:  01/07/2016 10:00 AM Medical Record Patient Account Number: [id number] Number: Treating RN: Montey Hora 1940-12-16 (75 y.o. Other Clinician: Date of Birth/Sex: Female) Treating ROBSON, MICHAEL Primary Care Physician: Lavera [name] Physician/Extender: G Referring Physician: [name] in Treatment: 2 Wound Status Wound Number: 2 Primary Open Surgical Wound Etiology: Wound Location: Chest - Midline, Distal Wound Open Wounding Event: Surgical Injury Status: Date Acquired: 11/11/2015 Comorbid Cataracts, Anemia, Congestive Heart Weeks Of Treatment: 1 History: Failure, Coronary Artery Disease, Clustered Wound: No Hypertension, Type II Diabetes, Osteoarthritis, Neuropathy, Received Chemotherapy Photos Wound Measurements Length: (cm) 0.7 % Reduction in Ar Width: (cm) 0.8 % Reduction in Vo Depth: (cm) 1.8 Epithelialization Area: (cm) 0.44 Tunneling: Volume: (cm) 0.792 Undermining: ea: -60% lume: -37.3% : None No No Wound Description Full Thickness Without Exposed Foul Odor After Classification: Support Structures Wound Margin: Flat and Intact Exudate Large Amount: Exudate Type: Sanguinous Exudate Color: red Diefenderfer, Maelynn A. ([id number]) Cleansing: No Wound Bed Granulation Amount: Large (67-100%) Exposed Structure Granulation Quality: Red Fascia Exposed: No Necrotic Amount: Small (1-33%) Fat Layer Exposed: No Necrotic Quality: Eschar, Adherent Slough Tendon Exposed: No Muscle Exposed: No Joint Exposed: No Bone Exposed: No Limited to Skin Breakdown Periwound Skin Texture Texture Color No Abnormalities Noted: No No Abnormalities Noted: No Callus: No Atrophie Blanche: No Crepitus: No Cyanosis: No Excoriation:  No Ecchymosis: No Fluctuance: No Erythema: No Friable: No Hemosiderin Staining: No Induration: No Mottled: No Localized Edema: No Pallor: No Rash: No Rubor: No Scarring: No Temperature / Pain Moisture Temperature: No Abnormality No Abnormalities Noted: No Dry / Scaly: No Maceration: No Moist: Yes Wound Preparation Ulcer Cleansing: Rinsed/Irrigated with Saline Topical Anesthetic Applied: Other: lidocaine 4%, Treatment Notes Wound #2 (Distal, Midline Chest) 1. Cleansed with: Clean wound with Normal Saline 2. Anesthetic Topical Lidocaine 4% cream to wound bed prior to debridement 4. Dressing Applied: Aquacel Ag 5. Secondary Dressing Applied Dry [place] Signature(s) Signed: 01/07/2016 5:43:35 PM By: Maxwell Marion, Alajia A. ([id number]) Entered By: Montey Hora on 01/07/2016 10:20:44 Rubens, Sugar AMarland Kitchen ([id number]) -------------------------------------------------------------------------------- Vitals Details Patient Name: Scalzo, Reann A. Date of Service: 01/07/2016 10:00 AM Medical Record Patient Account Number: [id number] Number: Treating RN: Montey Hora 1940/04/21 (75 y.o. Other Clinician: Date of Birth/Sex: Female) Treating ROBSON, MICHAEL Primary Care Physician: Lavera [name] Physician/Extender: G Referring Physician: [name] in Treatment: 2 Vital Signs Time Taken: 10:08 Temperature (F): 98.4 Height (in): 62 Pulse (bpm): 87 Weight (lbs): 161 Respiratory Rate (breaths/min): 18 Body Mass Index (BMI): 29.4 Blood Pressure (mmHg): 140/56 Reference Range: 80 - 120 mg / dl Electronic Signature(s) Signed: 01/07/2016 5:43:35 PM By: Montey Hora Entered By: Montey Hora on 01/07/2016 10:10:07

## 2016-01-10 LAB — AEROBIC CULTURE W GRAM STAIN (SUPERFICIAL SPECIMEN): Culture: NO GROWTH

## 2016-01-10 LAB — AEROBIC CULTURE  (SUPERFICIAL SPECIMEN)

## 2016-01-13 ENCOUNTER — Inpatient Hospital Stay (HOSPITAL_COMMUNITY): Payer: Medicare Other

## 2016-01-13 ENCOUNTER — Inpatient Hospital Stay (HOSPITAL_COMMUNITY)
Admission: EM | Admit: 2016-01-13 | Discharge: 2016-01-15 | DRG: 287 | Disposition: A | Discharge status: Home / Self Care | Payer: Medicare Other | Attending: Cardiovascular Disease | Admitting: Cardiovascular Disease

## 2016-01-13 ENCOUNTER — Other Ambulatory Visit (HOSPITAL_COMMUNITY)

## 2016-01-13 ENCOUNTER — Emergency Department (HOSPITAL_COMMUNITY): Payer: Medicare Other

## 2016-01-13 ENCOUNTER — Encounter: Payer: Self-pay | Admitting: *Deleted

## 2016-01-13 ENCOUNTER — Ambulatory Visit

## 2016-01-13 ENCOUNTER — Encounter (HOSPITAL_COMMUNITY): Payer: Self-pay | Admitting: *Deleted

## 2016-01-13 DIAGNOSIS — Z7982 Long term (current) use of aspirin: Secondary | ICD-10-CM | POA: Diagnosis not present

## 2016-01-13 DIAGNOSIS — I10 Essential (primary) hypertension: Secondary | ICD-10-CM | POA: Diagnosis present

## 2016-01-13 DIAGNOSIS — I739 Peripheral vascular disease, unspecified: Secondary | ICD-10-CM

## 2016-01-13 DIAGNOSIS — G893 Neoplasm related pain (acute) (chronic): Secondary | ICD-10-CM | POA: Diagnosis present

## 2016-01-13 DIAGNOSIS — I25700 Atherosclerosis of coronary artery bypass graft(s), unspecified, with unstable angina pectoris: Secondary | ICD-10-CM | POA: Diagnosis present

## 2016-01-13 DIAGNOSIS — I5042 Chronic combined systolic (congestive) and diastolic (congestive) heart failure: Secondary | ICD-10-CM | POA: Diagnosis not present

## 2016-01-13 DIAGNOSIS — Z7984 Long term (current) use of oral hypoglycemic drugs: Secondary | ICD-10-CM

## 2016-01-13 DIAGNOSIS — Z883 Allergy status to other anti-infective agents status: Secondary | ICD-10-CM | POA: Diagnosis not present

## 2016-01-13 DIAGNOSIS — Y838 Other surgical procedures as the cause of abnormal reaction of the patient, or of later complication, without mention of misadventure at the time of the procedure: Secondary | ICD-10-CM | POA: Diagnosis present

## 2016-01-13 DIAGNOSIS — Z7902 Long term (current) use of antithrombotics/antiplatelets: Secondary | ICD-10-CM | POA: Diagnosis not present

## 2016-01-13 DIAGNOSIS — Z888 Allergy status to other drugs, medicaments and biological substances status: Secondary | ICD-10-CM

## 2016-01-13 DIAGNOSIS — Z8673 Personal history of transient ischemic attack (TIA), and cerebral infarction without residual deficits: Secondary | ICD-10-CM

## 2016-01-13 DIAGNOSIS — I255 Ischemic cardiomyopathy: Secondary | ICD-10-CM | POA: Diagnosis present

## 2016-01-13 DIAGNOSIS — Z79899 Other long term (current) drug therapy: Secondary | ICD-10-CM

## 2016-01-13 DIAGNOSIS — Z882 Allergy status to sulfonamides status: Secondary | ICD-10-CM | POA: Diagnosis not present

## 2016-01-13 DIAGNOSIS — E1151 Type 2 diabetes mellitus with diabetic peripheral angiopathy without gangrene: Secondary | ICD-10-CM | POA: Diagnosis present

## 2016-01-13 DIAGNOSIS — R079 Chest pain, unspecified: Secondary | ICD-10-CM

## 2016-01-13 DIAGNOSIS — C9000 Multiple myeloma not having achieved remission: Secondary | ICD-10-CM | POA: Diagnosis present

## 2016-01-13 DIAGNOSIS — R0789 Other chest pain: Secondary | ICD-10-CM

## 2016-01-13 DIAGNOSIS — I252 Old myocardial infarction: Secondary | ICD-10-CM

## 2016-01-13 DIAGNOSIS — Z955 Presence of coronary angioplasty implant and graft: Secondary | ICD-10-CM

## 2016-01-13 DIAGNOSIS — M549 Dorsalgia, unspecified: Secondary | ICD-10-CM | POA: Diagnosis present

## 2016-01-13 DIAGNOSIS — I200 Unstable angina: Secondary | ICD-10-CM | POA: Diagnosis present

## 2016-01-13 DIAGNOSIS — T82855A Stenosis of coronary artery stent, initial encounter: Secondary | ICD-10-CM | POA: Diagnosis present

## 2016-01-13 DIAGNOSIS — E785 Hyperlipidemia, unspecified: Secondary | ICD-10-CM | POA: Diagnosis not present

## 2016-01-13 DIAGNOSIS — Z833 Family history of diabetes mellitus: Secondary | ICD-10-CM | POA: Diagnosis not present

## 2016-01-13 DIAGNOSIS — Z951 Presence of aortocoronary bypass graft: Secondary | ICD-10-CM

## 2016-01-13 DIAGNOSIS — I2510 Atherosclerotic heart disease of native coronary artery without angina pectoris: Secondary | ICD-10-CM | POA: Diagnosis not present

## 2016-01-13 DIAGNOSIS — I25709 Atherosclerosis of coronary artery bypass graft(s), unspecified, with unspecified angina pectoris: Secondary | ICD-10-CM | POA: Diagnosis not present

## 2016-01-13 HISTORY — DX: Unspecified osteoarthritis, unspecified site: M19.90

## 2016-01-13 HISTORY — DX: Heart failure, unspecified: I50.9

## 2016-01-13 HISTORY — DX: Angina pectoris, unspecified: I20.9

## 2016-01-13 HISTORY — DX: Other chronic pain: G89.29

## 2016-01-13 HISTORY — DX: Personal history of other medical treatment: Z92.89

## 2016-01-13 HISTORY — DX: Ischemic cardiomyopathy: I25.5

## 2016-01-13 HISTORY — DX: Type 2 diabetes mellitus without complications: E11.9

## 2016-01-13 HISTORY — DX: Cerebral infarction, unspecified: I63.9

## 2016-01-13 HISTORY — DX: Pure hypercholesterolemia, unspecified: E78.00

## 2016-01-13 HISTORY — DX: Anxiety disorder, unspecified: F41.9

## 2016-01-13 HISTORY — DX: Peripheral vascular disease, unspecified: I73.9

## 2016-01-13 LAB — CBC
HCT: 35.7 % — ABNORMAL LOW (ref 36.0–46.0)
HEMOGLOBIN: 11.4 g/dL — AB (ref 12.0–15.0)
MCH: 26.2 pg (ref 26.0–34.0)
MCHC: 31.9 g/dL (ref 30.0–36.0)
MCV: 82.1 fL (ref 78.0–100.0)
PLATELETS: 198 10*3/uL (ref 150–400)
RBC: 4.35 MIL/uL (ref 3.87–5.11)
RDW: 14.1 % (ref 11.5–15.5)
WBC: 9.2 10*3/uL (ref 4.0–10.5)

## 2016-01-13 LAB — I-STAT TROPONIN, ED: TROPONIN I, POC: 0.01 ng/mL (ref 0.00–0.08)

## 2016-01-13 LAB — BASIC METABOLIC PANEL
Anion gap: 9 (ref 5–15)
BUN: 10 mg/dL (ref 6–20)
CHLORIDE: 104 mmol/L (ref 101–111)
CO2: 22 mmol/L (ref 22–32)
Calcium: 9.3 mg/dL (ref 8.9–10.3)
Creatinine, Ser: 0.93 mg/dL (ref 0.44–1.00)
GFR calc Af Amer: >60 mL/min (ref >60)
GFR calc non Af Amer: 59 mL/min — ABNORMAL LOW (ref >60)
Glucose, Bld: 176 mg/dL — ABNORMAL HIGH (ref 65–99)
POTASSIUM: 3.9 mmol/L (ref 3.5–5.1)
Sodium: 135 mmol/L (ref 135–145)

## 2016-01-13 LAB — TROPONIN I: Troponin I: <0.03 ng/mL (ref <0.03)

## 2016-01-13 LAB — HEPARIN LEVEL (UNFRACTIONATED): Heparin Unfractionated: 0.35 IU/mL (ref 0.30–0.70)

## 2016-01-13 LAB — GLUCOSE, CAPILLARY
GLUCOSE-CAPILLARY: 174 mg/dL — AB (ref 65–99)
Glucose-Capillary: 143 mg/dL — ABNORMAL HIGH (ref 65–99)
Glucose-Capillary: 153 mg/dL — ABNORMAL HIGH (ref 65–99)

## 2016-01-13 LAB — BRAIN NATRIURETIC PEPTIDE: B NATRIURETIC PEPTIDE 5: 438.8 pg/mL — AB (ref 0.0–100.0)

## 2016-01-13 MED ORDER — METOPROLOL TARTRATE 25 MG PO TABS
25.0000 mg | ORAL_TABLET | Freq: Two times a day (BID) | ORAL | Status: DC
  Administered 2016-01-14 – 2016-01-15 (×3): 25 mg via ORAL
  Filled 2016-01-13 (×4): qty 1

## 2016-01-13 MED ORDER — ALPRAZOLAM 0.25 MG PO TABS: 0.2500 mg | ORAL_TABLET | Freq: Two times a day (BID) | ORAL | Status: DC | PRN

## 2016-01-13 MED ORDER — PROMETHAZINE HCL 25 MG/ML IJ SOLN
12.5000 mg | Freq: Four times a day (QID) | INTRAMUSCULAR | Status: DC | PRN
  Filled 2016-01-13: qty 1

## 2016-01-13 MED ORDER — METFORMIN HCL 500 MG PO TABS: 500.0000 mg | ORAL_TABLET | Freq: Two times a day (BID) | ORAL | Status: DC

## 2016-01-13 MED ORDER — CLOPIDOGREL BISULFATE 75 MG PO TABS
75.0000 mg | ORAL_TABLET | Freq: Every day | ORAL | Status: DC
  Administered 2016-01-13 – 2016-01-15 (×3): 75 mg via ORAL
  Filled 2016-01-13 (×4): qty 1

## 2016-01-13 MED ORDER — ISOSORBIDE MONONITRATE ER 30 MG PO TB24
30.0000 mg | ORAL_TABLET | Freq: Every day | ORAL | Status: DC
  Administered 2016-01-13 – 2016-01-15 (×3): 30 mg via ORAL
  Filled 2016-01-13 (×3): qty 1

## 2016-01-13 MED ORDER — ACETAMINOPHEN 325 MG PO TABS: 650.0000 mg | ORAL_TABLET | ORAL | Status: DC | PRN

## 2016-01-13 MED ORDER — ONDANSETRON HCL 4 MG/2ML IJ SOLN
4.0000 mg | Freq: Four times a day (QID) | INTRAMUSCULAR | Status: DC | PRN
  Administered 2016-01-13: 4 mg via INTRAVENOUS
  Filled 2016-01-13: qty 2

## 2016-01-13 MED ORDER — LISINOPRIL 5 MG PO TABS
5.0000 mg | ORAL_TABLET | Freq: Every day | ORAL | Status: DC
  Administered 2016-01-13 – 2016-01-15 (×3): 5 mg via ORAL
  Filled 2016-01-13 (×3): qty 1

## 2016-01-13 MED ORDER — HEPARIN BOLUS VIA INFUSION
3000.0000 [IU] | Freq: Once | INTRAVENOUS | Status: AC
  Administered 2016-01-13: 3000 [IU] via INTRAVENOUS
  Filled 2016-01-13: qty 3000

## 2016-01-13 MED ORDER — GABAPENTIN 300 MG PO CAPS
600.0000 mg | ORAL_CAPSULE | Freq: Every morning | ORAL | Status: DC
  Administered 2016-01-13 – 2016-01-15 (×3): 600 mg via ORAL
  Filled 2016-01-13 (×3): qty 2

## 2016-01-13 MED ORDER — HEPARIN (PORCINE) IN NACL 100-0.45 UNIT/ML-% IJ SOLN
850.0000 [IU]/h | INTRAMUSCULAR | Status: DC
  Administered 2016-01-13: 15:00:00 750 [IU]/h via INTRAVENOUS
  Filled 2016-01-13: qty 250

## 2016-01-13 MED ORDER — EZETIMIBE 10 MG PO TABS
10.0000 mg | ORAL_TABLET | Freq: Every day | ORAL | Status: DC
  Filled 2016-01-13 (×2): qty 1

## 2016-01-13 MED ORDER — GABAPENTIN 300 MG PO CAPS
300.0000 mg | ORAL_CAPSULE | Freq: Every day | ORAL | Status: DC
  Administered 2016-01-13 – 2016-01-14 (×2): 300 mg via ORAL
  Filled 2016-01-13 (×2): qty 1

## 2016-01-13 MED ORDER — HYDROMORPHONE HCL 2 MG PO TABS
2.0000 mg | ORAL_TABLET | ORAL | Status: DC | PRN
  Administered 2016-01-13: 15:00:00 2 mg via ORAL
  Filled 2016-01-13: qty 1

## 2016-01-13 MED ORDER — ASPIRIN EC 81 MG PO TBEC
81.0000 mg | DELAYED_RELEASE_TABLET | Freq: Every day | ORAL | Status: DC
  Administered 2016-01-14 – 2016-01-15 (×2): 81 mg via ORAL
  Filled 2016-01-13 (×2): qty 1

## 2016-01-13 MED ORDER — ENSURE ENLIVE PO LIQD
237.0000 mL | Freq: Two times a day (BID) | ORAL | Status: DC
  Filled 2016-01-13 (×3): qty 237

## 2016-01-13 MED ORDER — SODIUM CHLORIDE 0.9 % IV BOLUS (SEPSIS)
500.0000 mL | Freq: Once | INTRAVENOUS | Status: AC
  Administered 2016-01-13: 22:00:00 500 mL via INTRAVENOUS

## 2016-01-13 MED ORDER — DOPAMINE-DEXTROSE 3.2-5 MG/ML-% IV SOLN
0.0000 ug/kg/min | INTRAVENOUS | Status: DC
  Filled 2016-01-13: qty 250

## 2016-01-13 MED ORDER — INSULIN ASPART 100 UNIT/ML ~~LOC~~ SOLN
0.0000 [IU] | Freq: Three times a day (TID) | SUBCUTANEOUS | Status: DC
  Administered 2016-01-13 – 2016-01-14 (×2): 2 [IU] via SUBCUTANEOUS
  Administered 2016-01-15: 3 [IU] via SUBCUTANEOUS
  Administered 2016-01-15: 07:00:00 1 [IU] via SUBCUTANEOUS

## 2016-01-13 MED ORDER — FENTANYL 25 MCG/HR TD PT72
25.0000 ug | MEDICATED_PATCH | TRANSDERMAL | Status: DC
  Administered 2016-01-13: 19:00:00 25 ug via TRANSDERMAL
  Filled 2016-01-13: qty 1

## 2016-01-13 MED ORDER — IOPAMIDOL (ISOVUE-370) INJECTION 76%
INTRAVENOUS | Status: AC
  Administered 2016-01-13: 100 mL
  Filled 2016-01-13: qty 100

## 2016-01-13 MED ORDER — GABAPENTIN 300 MG PO CAPS: 300.0000 mg | ORAL_CAPSULE | Freq: Every day | ORAL | Status: DC

## 2016-01-13 MED ORDER — BOOST / RESOURCE BREEZE PO LIQD
1.0000 | Freq: Three times a day (TID) | ORAL | Status: DC
  Administered 2016-01-13 – 2016-01-15 (×2): 1 via ORAL
  Filled 2016-01-13 (×9): qty 1

## 2016-01-13 MED ORDER — ASPIRIN 81 MG PO CHEW
324.0000 mg | CHEWABLE_TABLET | Freq: Once | ORAL | Status: AC
  Administered 2016-01-13: 324 mg via ORAL
  Filled 2016-01-13: qty 4

## 2016-01-13 MED ORDER — NITROGLYCERIN 0.4 MG SL SUBL
0.4000 mg | SUBLINGUAL_TABLET | SUBLINGUAL | Status: DC | PRN
  Administered 2016-01-14 (×3): 0.4 mg via SUBLINGUAL

## 2016-01-13 MED ORDER — NITROGLYCERIN 0.4 MG SL SUBL: 0.4000 mg | SUBLINGUAL_TABLET | SUBLINGUAL | Status: DC | PRN

## 2016-01-13 NOTE — ED Provider Notes (Signed)
[facility] DEPT Provider Note   CSN: [id number] Arrival date & time: 01/13/16  0758     History   Chief Complaint Chief Complaint  Patient presents with  . Back Pain    HPI [name] is a 76 y.o. female.  HPI 76 year old female with past medical history of hypertension, diabetes, MI, CABG who presents with chest pain. Pt states that she awoke this morning with aching, dull, substernal CP and back pain with associated SOB. The pain feels similar to the pain she had prior to her recent CABG. She states that the pain worsens with movement and exertion. Denies any recent falls. She endorses associated shortness of breath, nausea, but no diaphoresis. No recent medication change. She's been compliant with her medications. She is currently switching to Cardiology and Subsequent Presents for evaluation. She saw Dr. Lincoln Brigham with [facility] previously.  Past Medical History:  Diagnosis Date  . Anginal pain ([place])   . Anxiety   . Arthritis    "shoulders primarily" (01/13/2016)  . CHF (congestive heart failure) ([place])   . Chronic pain    from her multiple myeloma but says that her pain is usually in her legs/notes 01/13/2016  . High cholesterol   . History of blood transfusion    "numerous; related to procedures for my heart" (01/13/2016)  . Hypertension   . Ischemic cardiomyopathy    /nots 01/13/2016  . MI (myocardial infarction)    EKG on arrival 01/13/2016 showed NSR with evidence of old anterior and inferior infarcts  . MI (myocardial infarction) 08/2015  . Multiple myeloma ([place]) dx'd 2015  . PAD (peripheral artery disease) ([place])   . Stroke [facility])    "several in 1 year; ? year" (01/13/2016)  . Type II diabetes mellitus [facility])     Patient Active Problem List   Diagnosis Date Noted  . Unstable angina ([place]) 01/13/2016  . Other chest pain   . PVD (peripheral vascular disease) ([place])   . Coronary artery disease involving coronary bypass graft of native heart with angina pectoris ([place])   .  PAD (peripheral artery disease) ([place]) 12/15/2015  . Chronic pain 11/12/2015  . 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 Status post bariatric surgery 11/07/2013  . Neuropathy, peripheral ([place]) 10/10/2013  . Multiple myeloma ([place]) 07/16/2013  . Claudication in peripheral vascular disease ([place]) 07/27/2011  . CAD (coronary artery disease) 04/26/2011  . GERD (gastroesophageal reflux disease) 04/26/2011  . Diabetes mellitus, type 2 ([place]) 03/30/2011  . Hyperlipidemia 03/30/2011  . Hypertension 03/30/2011    Past Surgical History:  Procedure Laterality Date  . CARPAL TUNNEL RELEASE Left   . CORONARY ANGIOPLASTY WITH STENT PLACEMENT  05/2015   DES placed to the mid and distal LAD as well as OM2/notes 01/13/2016  . CORONARY ANGIOPLASTY WITH STENT PLACEMENT  ~ 2010  . CORONARY ARTERY BYPASS GRAFT  11/11/2015   CABG X2 LIMA-LAD and SVG-OM/notes 01/13/2016  . DILATION AND CURETTAGE OF UTERUS    . FEMORAL-POPLITEAL BYPASS GRAFT Left 2013   Archie Endo 01/13/2016  . LAPAROSCOPIC GASTRIC BANDING  2007  . RENAL ARTERY STENT    . VAGINAL HYSTERECTOMY      OB History    No data available       Home Medications    Prior to Admission medications   Medication Sig Start Date End Date Taking? Authorizing Provider  aspirin 81 MG tablet Take 81 mg by mouth daily.   Yes Historical Provider, MD  Cholecalciferol (VITAMIN D3) 50000 units CAPS  Take 1 capsule by mouth 2 (two) times a week.   Yes Historical Provider, MD  clopidogrel (PLAVIX) 75 MG tablet Take 75 mg by mouth daily.   Yes Historical Provider, MD  diclofenac sodium (VOLTAREN) 1 % GEL Apply 2 g topically daily as needed for pain.   Yes Historical Provider, MD  fentaNYL (DURAGESIC - DOSED MCG/HR) 25 MCG/HR patch Place 25 mcg onto the skin every 3 (three) days.   Yes Historical Provider, MD  gabapentin (NEURONTIN) 300 MG capsule Take 300-600 mg by mouth See admin instructions. Pt takes 620m in am, 3070mat bedtime   Yes Historical Provider, MD  HYDROmorphone  (DILAUDID) 2 MG tablet Take 2 mg by mouth every 4 (four) hours as needed for severe pain.   Yes Historical Provider, MD  metFORMIN (GLUCOPHAGE) 500 MG tablet Take 1,000 mg by mouth 2 (two) times daily with a meal.    Yes Historical Provider, MD  metoprolol (LOPRESSOR) 50 MG tablet Take 37.5 mg by mouth 2 (two) times daily.    Yes Historical Provider, MD  ondansetron (ZOFRAN-ODT) 4 MG disintegrating tablet Take 4 mg by mouth every 8 (eight) hours as needed for nausea or vomiting.   Yes Historical Provider, MD  senna-docusate (SENOKOT-S) 8.6-50 MG tablet Take 2 tablets by mouth 2 (two) times daily as needed for constipation. 11/17/15 11/16/16 Yes Historical Provider, MD    Family History Family History  Problem Relation Age of Onset  . Diabetes Mother   . Heart failure Mother     Social History Social History  Substance Use Topics  . Smoking status: Never Smoker  . Smokeless tobacco: Never Used  . Alcohol use No     Allergies   Other; Amoxicillin; Codeine; Duloxetine; Statins; and Sulfa antibiotics   Review of Systems Review of Systems  Constitutional: Positive for fatigue. Negative for chills and fever.  HENT: Negative for congestion and rhinorrhea.   Eyes: Negative for visual disturbance.  Respiratory: Positive for chest tightness and shortness of breath. Negative for cough and wheezing.   Cardiovascular: Positive for chest pain. Negative for leg swelling.  Gastrointestinal: Negative for abdominal pain, diarrhea, nausea and vomiting.  Genitourinary: Negative for dysuria and flank pain.  Musculoskeletal: Negative for neck pain and neck stiffness.  Skin: Negative for rash and wound.  Allergic/Immunologic: Negative for immunocompromised state.  Neurological: Negative for syncope, weakness and headaches.  All other systems reviewed and are negative.    Physical Exam Updated Vital Signs BP (!) 206/86 (BP Location: Right Arm)   Pulse 93   Temp 97 F (36.1 C) (Axillary)    Resp 20   SpO2 97%   Physical Exam  Constitutional: She is oriented to person, place, and time. She appears well-developed and well-nourished. No distress.  HENT:  Head: Normocephalic and atraumatic.  Mouth/Throat: Oropharynx is clear and moist.  Eyes: Conjunctivae are normal.  Neck: Neck supple.  Cardiovascular: Normal rate, regular rhythm and normal heart sounds.  Exam reveals no friction rub.   No murmur heard. Pulmonary/Chest: Effort normal and breath sounds normal. No respiratory distress. She has no wheezes. She has no rales.  Abdominal: She exhibits no distension.  Musculoskeletal: She exhibits no edema.  Neurological: She is alert and oriented to person, place, and time. She exhibits normal muscle tone.  Skin: Skin is warm. Capillary refill takes less than 2 seconds.  Small open area of midline sternotomy incision with no drainage or erythema.  Psychiatric: She has a normal mood and affect.  Nursing note and vitals reviewed.    ED Treatments / Results  Labs (all labs ordered are listed, but only abnormal results are displayed) Labs Reviewed  BASIC METABOLIC PANEL - Abnormal; Notable for the following:       Result Value   Glucose, Bld 176 (*)    GFR calc non Af Amer 59 (*)    All other components within normal limits  CBC - Abnormal; Notable for the following:    Hemoglobin 11.4 (*)    HCT 35.7 (*)    All other components within normal limits  BRAIN NATRIURETIC PEPTIDE - Abnormal; Notable for the following:    B Natriuretic Peptide 438.8 (*)    All other components within normal limits  GLUCOSE, CAPILLARY - Abnormal; Notable for the following:    Glucose-Capillary 143 (*)    All other components within normal limits  GLUCOSE, CAPILLARY - Abnormal; Notable for the following:    Glucose-Capillary 153 (*)    All other components within normal limits  TROPONIN I  TROPONIN I  TROPONIN I  TROPONIN I  HEPARIN LEVEL (UNFRACTIONATED)  HEMOGLOBIN A1C  LIPID PANEL    BASIC METABOLIC PANEL  CBC  PROTIME-INR  HEPARIN LEVEL (UNFRACTIONATED)  I-STAT TROPOININ, ED    EKG  EKG Interpretation  Date/Time:  Tuesday January 13 2016 08:17:57 EST Ventricular Rate:  76 PR Interval:    QRS Duration: 98 QT Interval:  400 QTC Calculation: 450 R Axis:   -31 Text Interpretation:  Sinus rhythm Inferior infarct, old Anterior infarct, old No significant change since last tracing Confirmed by Ezreal Turay MD, Lysbeth Galas [id number]) on 01/13/2016 8:50:17 AM Also confirmed by Ellender Hose MD, Eliazar Olivar [phone]), editor [place] CT, Leda Gauze [phone])  on 01/13/2016 10:11:03 AM       Radiology Dg Chest 2 View  Result Date: 01/13/2016 CLINICAL DATA:  Shortness of breath, nausea, and upper back pain beginning at 4 a.m. this morning. History of previous MI, multiple myeloma, diabetes. EXAM: CHEST  2 VIEW COMPARISON:  Chest x-ray of November 29, 2015 FINDINGS: The the lungs are adequately inflated. There is no focal infiltrate. Previously demonstrated pleural effusions have resolved. The heart and pulmonary vascularity are normal. There is calcification in the wall of the aortic arch. The patient has undergone previous CABG. The bony thorax exhibits no acute abnormality. IMPRESSION: There is no acute cardiopulmonary abnormality.  Previous CABG. Thoracic aortic atherosclerosis. Electronically Signed   By: David  Martinique M.D.   On: 01/13/2016 08:59   Ct Angio Chest/abd/pel For Dissection W And/or W/wo  Result Date: 01/13/2016 CLINICAL DATA:  76 year old female with sudden onset severe centralized back pain with nausea and vomiting since 0400 hours. Initial encounter. EXAM: CT ANGIOGRAPHY CHEST, ABDOMEN AND PELVIS TECHNIQUE: Multidetector CT imaging through the chest, abdomen and pelvis was performed using the standard protocol during bolus administration of intravenous contrast. Multiplanar reconstructed images and MIPs were obtained and reviewed to evaluate the vascular anatomy. CONTRAST:  100 mL Isovue 370  COMPARISON:  Chest radiographs 751 hours today and earlier. Cervical spine CT 07/26/2015. FINDINGS: CTA CHEST FINDINGS Cardiovascular: Extensive calcified aortic and coronary artery atherosclerosis. Sequelae of CABG. Mild cardiomegaly. No pericardial effusion. No thoracic aortic aneurysm or dissection. Proximal great vessels are patent. There is also adequate contrast in the central pulmonary arteries. No pulmonary artery filling defect identified. Mediastinum/Nodes: No mediastinal or hilar lymphadenopathy. Lungs/Pleura: Major airways are patent. There is a small layering left pleural effusion. There is mild associated left lung compressive atelectasis. There is mild  superimposed bilateral dependent pulmonary atelectasis. Small subpleural nodule along the right minor fissure on series 7, image 70 has a benign / postinflammatory appearance. No other abnormal pulmonary opacity . Musculoskeletal: Prior median sternotomy. No acute osseous abnormality identified. Review of the MIP images confirms the above findings. CTA ABDOMEN AND PELVIS FINDINGS VASCULAR Extensive Aortoiliac calcified atherosclerosis. No abdominal aortic aneurysm or dissection. Major aortic branches including the celiac, SMA, bilateral renal arteries, and IMA are patent. Both external iliac arteries are patent. There is poor flow or occlusion of the distal most left internal iliac artery branches. The right internal iliac artery is appear patent. Calcified atherosclerosis continues into the bilateral femoral arteries. There is a patent bypass graft of the left common femoral artery extending distally. There is an occluded left SFA (Series 6, image 285). The visible left PFA is patent. Stent there is a patent stent of the right SFA (series 6, image 299). The visible right PFA is patent. Review of the MIP images confirms the above findings. NON-VASCULAR Hepatobiliary: No abdominal free air or free fluid identified. Borderline to mild hepatic steatosis.  Otherwise negative liver and gallbladder. No biliary ductal enlargement. Pancreas: Negative. Spleen: Negative. Adrenals/Urinary Tract: Minimally nodular left adrenal gland but overall preserved left adreniform shape. Negative right adrenal gland. Bilateral renal enhancement appears symmetric. No hydronephrosis or hydroureter. No definite nephrolithiasis. Small volume of gas in the non dependent urinary bladder (Series 6, image 263). The bladder otherwise appears unremarkable. Stomach/Bowel: Gas distended rectum appears normal. There is moderate to severe diverticulosis throughout the sigmoid colon, and the sigmoid wall appears indistinct. There does appear to be subtle mesenteric stranding about the mid sigmoid colon as seen on series 9, image 70. No extraluminal gas. Diverticulosis continues in the left colon with no definite active inflammation. Mild diverticulosis at the hepatic flexure with otherwise negative transverse colon and ascending colon. Negative cecum and appendix. Negative terminal ileum. No dilated small bowel. Sequelae of gastric banding. The proximal portion of the stomach is nondilated. There is gas and fluid in the more distal stomach which appears within normal limits. Incidental diverticulum of the second portion of the duodenum. The duodenum otherwise appears normal. Lymphatic: Maximal lymph nodes in the retroperitoneum (such as on series 6, image 163). No lymphadenopathy elsewhere in the abdomen or pelvis. Reproductive: Surgically absent uterus. The right adnexa appears normal on series 6, image 247. The left is diminutive or absent. Other: Small volume of pelvic free fluid (series 6, image 258). 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 Vacuum disc at the adjacent lower lumbar segment. No acute osseous abnormality identified. Review of the MIP images confirms the above findings. IMPRESSION: 1. Extensive aortic and other major arterial structure atherosclerosis. No  aortic aneurysm or dissection. Prior CABG. No central or hilar pulmonary artery embolus. Partially visible left femoral artery bypass graft is patent. Partially visible right SFA stent is patent. 2. Nonspecific small layering left pleural effusion and small volume pelvic free fluid. 3. With regard the pelvic fluid there is severe diverticulosis of the sigmoid colon and mild acute diverticulitis is difficult to exclude. No other bowel inflammation suspected. The appendix is normal. Prior gastric banding with no adverse features. 4. With regard to the small left pleural effusion there is only mild associated pulmonary atelectasis. 5. Small volume of gas in the urinary bladder, suspicious for acute UTI unless explained by recent catheterization. Electronically Signed   By: Genevie Ann M.D.   On: 01/13/2016 13:14    Procedures Procedures (  including critical care time)  Medications Ordered in ED Medications  nitroGLYCERIN (NITROSTAT) SL tablet 0.4 mg (not administered)  aspirin EC tablet 81 mg (0 mg Oral Duplicate 08/15/10 8208)  clopidogrel (PLAVIX) tablet 75 mg (not administered)  ezetimibe (ZETIA) tablet 10 mg (10 mg Oral Not Given 01/13/16 1436)  fentaNYL (DURAGESIC - dosed mcg/hr) patch 25 mcg (not administered)  HYDROmorphone (DILAUDID) tablet 2 mg (2 mg Oral Given 01/13/16 1430)  isosorbide mononitrate (IMDUR) 24 hr tablet 30 mg (30 mg Oral Given 01/13/16 1525)  lisinopril (PRINIVIL,ZESTRIL) tablet 5 mg (5 mg Oral Given 01/13/16 1524)  metFORMIN (GLUCOPHAGE) tablet 500 mg (500 mg Oral Not Given 01/13/16 1746)  metoprolol tartrate (LOPRESSOR) tablet 25 mg (not administered)  acetaminophen (TYLENOL) tablet 650 mg (not administered)  ondansetron (ZOFRAN) injection 4 mg (4 mg Intravenous Given 01/13/16 1323)  ALPRAZolam (XANAX) tablet 0.25 mg (not administered)  insulin aspart (novoLOG) injection 0-9 Units (not administered)  gabapentin (NEURONTIN) capsule 300 mg (not administered)  gabapentin (NEURONTIN) capsule  600 mg (600 mg Oral Given 01/13/16 1523)  heparin ADULT infusion 100 units/mL (25000 units/24m sodium chloride 0.45%) (750 Units/hr Intravenous New Bag/Given 01/13/16 1527)  promethazine (PHENERGAN) injection 12.5 mg (not administered)  feeding supplement (ENSURE ENLIVE) (ENSURE ENLIVE) liquid 237 mL (not administered)  aspirin chewable tablet 324 mg (324 mg Oral Given 01/13/16 0943)  iopamidol (ISOVUE-370) 76 % injection (100 mLs  Contrast Given 01/13/16 1240)  heparin bolus via infusion 3,000 Units (3,000 Units Intravenous Bolus from Bag 01/13/16 1526)     Initial Impression / Assessment and Plan / ED Course  I have reviewed the triage vital signs and the nursing notes.  Pertinent labs & imaging results that were available during my care of the patient were reviewed by me and considered in my medical decision making (see chart for details).  Clinical Course     76yo F with extensive CAD history here with mild CP, SOB. On arrival, pt is CP free. VS are stable. Exam is as above - pt mildly hypervolemic but with normal WOB. CXR negative. Labs show mildly elevated BNP, o/w are unremarkable. Trop neg. EKG non-ischemic. CP free after nitro. I discussed labs, presentation with Cardiology, wo who will admit. CTA obtained and shows extensive atherosclerosis, pleural effusions but no PE. Cards will admit.  Final Clinical Impressions(s) / ED Diagnoses   Final diagnoses:  Back pain  Chest pain    New Prescriptions Current Discharge Medication List       CDuffy Bruce MD 01/13/16 1751

## 2016-01-13 NOTE — H&P (Signed)
Cardiology Consult    Patient ID: Denis Koppel Glockner MRN: [id number], DOB/AGE: 1940/06/30   Admit date: 01/13/2016 Date of Consult: 01/13/2016  Primary Physician: Lynnell Jude, MD  Primary Cardiologist: Previously has followed Dr. Chryl Heck at [facility], but wishes to establish with [facility] and wants to see Dr. Meda Coffee    Patient Profile    Ms. Creedon is a 76 year old female with a past medical history of CAD s/p CABG, DM, HTN, PAD s/p L fem pop in 2013, multiple myeloma, and ischemic cardiomyopathy. She presented to the ED on 01/13/15 with severe back pain which is historically her anginal equivalent.   History of Present Illness    Ms. Plasse did not feel well all day on New Years Day, felt tired and very nauseous. She took multiple doses of Zofran which intermittently helped her nausea. She went to sleep and was awakened with severe left sided back pain with associated SOB and continued nausea.   She called EMS and was transport to [facility]. Her EKG on arrival showed NSR with evidence of old anterior and inferior infarcts. No acute ST changes when compared to her prior EKG's. She continued to feel nauseous but her back pain has been alleviated somewhat.   In May 2017 she presented to [facility] with abdominal pain and chest discomfort, she ruled in for NSTEMI and underwent heart catheterization. She had DES placed to the mid and distal LAD as well as OM2. Also with mid circumflex lesion that was not intervened upon as it was felt to be chronic.   In October 2017 she presented to [facility] with a 2 day history of persistent nausea and vomiting. In the ED she developed left sided jaw pain and substernal chest pressure. Pharmacologic stress test was abnormal and she underwent LHC. She subsequently underwent 2 vessel CABG on 11/11/15 with LIMA-LAD and SVG-OM.   Her last Echo was in August 2017 at that time her EF was 45-50%, moderate MR, septal basilar akinesis.   She tells me that her back pain is her anginal  equivalent (although not charted as such when notes reviewed in [place]). She suffers from chronic pain from her multiple myeloma but says that her pain is usually in her legs.   Troponin is negative.   Past Medical History   Past Medical History:  Diagnosis Date  . Diabetes mellitus without complication ([place])   . Hypertension   . MI (myocardial infarction)   . Multiple myeloma [facility])     Past Surgical History:  Procedure Laterality Date  . ABDOMINAL HYSTERECTOMY    . CARDIAC SURGERY    . CORONARY ARTERY BYPASS GRAFT    . LAPAROSCOPIC GASTRIC BANDING       Allergies  Allergies  Allergen Reactions  . Amoxicillin   . Codeine   . Statins Swelling  . Sulfa Antibiotics     Inpatient Medications      Family History    Family History  Problem Relation Age of Onset  . Diabetes Mother   . Heart failure Mother     Social History    Social History   Social History  . Marital status: Single    Spouse name: N/A  . Number of children: N/A  . Years of education: N/A   Occupational History  . Not on file.   Social History Main Topics  . Smoking status: Never Smoker  . Smokeless tobacco: Never Used  . Alcohol use No  . Drug use: No  .  Sexual activity: Not on file   Other Topics Concern  . Not on file   Social History Narrative  . No narrative on file     Review of Systems    General:  No chills, fever, night sweats or weight changes.  Cardiovascular:  No chest pain, dyspnea on exertion, edema, orthopnea, palpitations, paroxysmal nocturnal dyspnea. Dermatological: No rash, lesions/masses Respiratory: No cough, dyspnea Urologic: No hematuria, dysuria Abdominal:   No nausea, vomiting, diarrhea, bright red blood per rectum, melena, or hematemesis Neurologic:  No visual changes, wkns, changes in mental status. + back pain  All other systems reviewed and are otherwise negative except as noted above.  Physical Exam    Blood pressure 176/68, pulse 76,  temperature 98.3 F (36.8 C), temperature source Oral, resp. rate 20, SpO2 99 %.  General: Pleasant, NAD Psych: Normal affect. Neuro: Alert and oriented X 3. Moves all extremities spontaneously. HEENT: Normal  Neck: Supple without bruits or JVD. Lungs:  Resp regular and unlabored, CTA. Heart: RRR no s3, s4, or murmurs. Abdomen: Soft, non-tender, non-distended, BS + x 4.  Extremities: No clubbing, cyanosis or edema. DP/PT/Radials 2+ and equal bilaterally.  Labs    Troponin [facility] Test)  Recent Labs  01/13/16 0842  TROPIPOC 0.01    Recent Labs  01/13/16 0836  TROPONINI <0.03   Lab Results  Component Value Date   WBC 9.2 01/13/2016   HGB 11.4 (L) 01/13/2016   HCT 35.7 (L) 01/13/2016   MCV 82.1 01/13/2016   PLT 198 01/13/2016     Recent Labs Lab 01/13/16 0836  NA 135  K 3.9  CL 104  CO2 22  BUN 10  CREATININE 0.93  CALCIUM 9.3  GLUCOSE 176*     Radiology Studies    Dg Chest 2 View  Result Date: 01/13/2016 CLINICAL DATA:  Shortness of breath, nausea, and upper back pain beginning at 4 a.m. this morning. History of previous MI, multiple myeloma, diabetes. EXAM: CHEST  2 VIEW COMPARISON:  Chest x-ray of November 29, 2015 FINDINGS: The the lungs are adequately inflated. There is no focal infiltrate. Previously demonstrated pleural effusions have resolved. The heart and pulmonary vascularity are normal. There is calcification in the wall of the aortic arch. The patient has undergone previous CABG. The bony thorax exhibits no acute abnormality. IMPRESSION: There is no acute cardiopulmonary abnormality.  Previous CABG. Thoracic aortic atherosclerosis. Electronically Signed   By: David  Martinique M.D.   On: 01/13/2016 08:59    EKG & Cardiac Imaging    EKG: NSR, old inferior and anterior infarcts noted.    Assessment & Plan    1. Back pain with concerns for anginal equivalent: Patient awakened with severe back pain that radiated to the left side of her back.  This is her usual anginal equivalent although not documented as such. She is s/p CABG (LIMA - LAD, SVG-OM) in October 2017 after having a NSTEMI in May 2017 with DES x 3 placed to her LAD and OM2. She was compliant with DAPT according to records.   Troponin is negative, will await Echo results. With history of CAD and recent CABG, can preform Lexiscan Myoview tomorrow to assess for any new areas of ischemia. Unless she rules in for NSTEMI then will plan for [facility] tomorrow.   MD to advise on heparin gtt.   Would also consider CT chest to rule out dissection.   2. History of CAD s/p CABG  3. DM: Will place orders for  SSI.     4. History of multiple myeloma: Will order her home pain medications this admission. She sees Dr. Laverta Baltimore at [facility] for this.   5. Sternal wound: Patient expresses that she is being treated for a wound infection at her CABG site. Will get wound care to see here, she has a piece of silver nitrate on the wound now and a dry gauze.   6. Ischemic cardiomyopathy: Last EF 45-50%, continue beta blocker and ACE-I.     Signed, Arbutus Leas, NP 01/13/2016, 11:12 AM Pager: [phone]   Patient seen and examined. Agree with assessment and plan. Ms.  Carano is a 76 year old Caucasian female who has a history of significant peripheral vascular disease status post prior stenting and femoropopliteal surgery at [facility].  In May 2017 she suffered a non-ST segment elevation MI and underwent stenting to her LAD and obtuse marginal 2 vessel.  She ultimately underwent CABG revascularization surgery in October 2017 with a LIMA to the LAD and vein graft to the obtuse marginal vessel.  She admits to statin intolerance as well as Zetia intolerance and has not been on lipid-lowering therapy.  He has a history of multiple myeloma followed at [facility].  Following her surgery, her isosorbide, Plavix, and lisinopril were ultimately discontinued.  However, 3 weeks ago when she was seen for post hospital evaluation.   She was told to resume Plavix and has been taking 75 mg daily for the last 3 weeks.  As morning she was awakened from sleep with significant back discomfort and chest discomfort.  In the past.  She also had experienced significant jaw discomfort with her angina, which she did not demonstrate last night.  She presented for evaluation.  Presently, she is pain-free.  A CT scan was done which was negative for aortic dissection.  She had extensive calcified aortic and coronary artery atherosclerosis with aortoiliac calcified atherosclerosis.  He was a small layering left pleural effusion.  Mild superimposed bilateral dependent pulmonary atelectasis.  A patent bypass graft of the left common femoral artery extending distally and an occluded left SFA.  The pain, left PFA.  The stent in the right SFA was patent.  At present, the patient is pain-free.  Her resting heart rate is in the 90s, and I will institute low-dose beta blocker therapy.  I will continue Plavix.  She will be scheduled for a Lexiscan nuclear perfusion study for risk stratification and make certain there is no significant ischemia.  If there is, repeat cardiac catheterization will be recommended.  I had a long discussion with her regarding the absolute importance of lipid lowering therapy.  If she could not tolerate statin therapy or Zetia, I discussed with her at length recent outcome data and benefit of PCSK9 inhibition with Repatha.   Troy Sine, MD, [facility] 01/13/2016 4:35 PM

## 2016-01-13 NOTE — Progress Notes (Signed)
ANTICOAGULATION CONSULT NOTE - Initial Consult  Pharmacy Consult for Heparin Indication: chest pain/ACS  Allergies  Allergen Reactions  . Other Swelling    Smoke--Head swelling  . Amoxicillin   . Codeine   . Duloxetine Nausea Only  . Statins Swelling  . Sulfa Antibiotics     Patient Measurements:   Heparin Dosing Weight: 66kg (base on wt from early Dec 2017)  Vital Signs: Temp: 98.1 F (36.7 C) (01/02 1415) Temp Source: Oral (01/02 1415) BP: 170/98 (01/02 1415) Pulse Rate: 79 (01/02 1415)  Labs:  Recent Labs  01/13/16 0836  HGB 11.4*  HCT 35.7*  PLT 198  CREATININE 0.93  TROPONINI <0.03    CrCl cannot be calculated (Unknown ideal weight.).   Medical History: Past Medical History:  Diagnosis Date  . Diabetes mellitus without complication ([place])   . Hypertension   . MI (myocardial infarction)   . Multiple myeloma ([facility])     Medications:  Scheduled:  . aspirin EC  81 mg Oral Daily  . clopidogrel  75 mg Oral Daily  . ezetimibe  10 mg Oral Daily  . fentaNYL  25 mcg Transdermal Q72H  . gabapentin  300 mg Oral QHS  . gabapentin  600 mg Oral q morning - 10a  . insulin aspart  0-9 Units Subcutaneous TID WC  . isosorbide mononitrate  30 mg Oral Daily  . lisinopril  5 mg Oral Daily  . metFORMIN  500 mg Oral BID WC  . metoprolol  25 mg Oral BID    Assessment: 76yo female with nausea and severe back pain which is her anginal equivalent.  She is s/p CABG in 10/2015 after NSTEMI/cath in 05/2015.  She is to start heparin and has received no heparin thus far.  Goal of Therapy:  Heparin level 0.3-0.7 units/ml Monitor platelets by anticoagulation protocol: Yes   Plan:  Heparin 3000 units IV x 1, then 750 units/hr Check heparin level in 6hr Daily heparin level, CBC Watch for s/s of bleeding   Gracy Bruins, PharmD [facility]

## 2016-01-13 NOTE — Progress Notes (Signed)
Dr. Radford Pax made aware of hypotension. B/P 74-83/33-40. Pt asymptomatic.NS bolus infusing.Will continue to monitor Supervisor made aware of possible transfer if pressors needed.

## 2016-01-13 NOTE — ED Notes (Signed)
Pt ambulatory w/ steady gait to restroom. 

## 2016-01-13 NOTE — ED Triage Notes (Signed)
Per EMS:  Pt is post CABG 10/2015 and has had incision wound care at [facility].  Today woke up with mid back pain that is constant since.  Pt reports nausea as well and took on Zofran and then was given Zofran 4mg  iv by ems

## 2016-01-13 NOTE — Progress Notes (Signed)
Cardiac Individual Treatment Plan  Patient Details  Name: [name] MRN: [id number] Date of Birth: 15-Mar-1940 Referring Provider:   Flowsheet Row Cardiac Rehab from 12/15/2015 in [facility] Cardiac and Pulmonary Rehab  Referring Provider  Harle Stanford MD      Initial Encounter Date:  Flowsheet Row Cardiac Rehab from 12/15/2015 in [facility] Cardiac and Pulmonary Rehab  Date  12/15/15  Referring Provider  Harle Stanford MD      Visit Diagnosis: S/P CABG x 2  Patient's Home Medications on Admission:  Current Outpatient Prescriptions:  .  aspirin 81 MG tablet, Take 81 mg by mouth daily., Disp: , Rfl:  .  Cholecalciferol (VITAMIN D3) 50000 units CAPS, Take 1 capsule by mouth 2 (two) times a week., Disp: , Rfl:  .  clopidogrel (PLAVIX) 75 MG tablet, Take 75 mg by mouth daily., Disp: , Rfl:  .  ezetimibe (ZETIA) 10 MG tablet, Take 10 mg by mouth daily., Disp: , Rfl:  .  fentaNYL (DURAGESIC - DOSED MCG/HR) 25 MCG/HR patch, Place 25 mcg onto the skin every 3 (three) days., Disp: , Rfl:  .  gabapentin (NEURONTIN) 300 MG capsule, Take 300 mg by mouth at bedtime., Disp: , Rfl:  .  HYDROmorphone (DILAUDID) 2 MG tablet, Take 2 mg by mouth every 4 (four) hours as needed for severe pain., Disp: , Rfl:  .  isosorbide mononitrate (IMDUR) 30 MG 24 hr tablet, Take 30 mg by mouth daily., Disp: , Rfl:  .  lisinopril (PRINIVIL,ZESTRIL) 5 MG tablet, Take 5 mg by mouth daily., Disp: , Rfl:  .  metFORMIN (GLUCOPHAGE) 500 MG tablet, Take by mouth 2 (two) times daily with a meal., Disp: , Rfl:  .  metoprolol (LOPRESSOR) 50 MG tablet, Take 50 mg by mouth daily., Disp: , Rfl:  .  nitroGLYCERIN (NITROSTAT) 0.4 MG SL tablet, Place 0.4 mg under the tongue every 5 (five) minutes as needed for chest pain., Disp: , Rfl:  .  ondansetron (ZOFRAN-ODT) 4 MG disintegrating tablet, Take 4 mg by mouth every 8 (eight) hours as needed for nausea or vomiting., Disp: , Rfl:   Past Medical History: Past Medical History:   Diagnosis Date  . Diabetes mellitus without complication ([place])   . Hypertension   . MI (myocardial infarction)   . Multiple myeloma ([facility])     Tobacco Use: History  Smoking Status  . Never Smoker  Smokeless Tobacco  . Never Used    Labs: Recent Review Flowsheet Data    Labs for ITP Cardiac and Pulmonary Rehab Latest Ref Rng & Units 03/05/2010   TCO2 0 - 100 mmol/L 22       Exercise Target Goals:    Exercise Program Goal: Individual exercise prescription set with THRR, safety & activity barriers. Participant demonstrates ability to understand and report RPE using BORG scale, to self-measure pulse accurately, and to acknowledge the importance of the exercise prescription.  Exercise Prescription Goal: Starting with aerobic activity 30 plus minutes a day, 3 days per week for initial exercise prescription. Provide home exercise prescription and guidelines that participant acknowledges understanding prior to discharge.  Activity Barriers & Risk Stratification:     Activity Barriers & Cardiac Risk Stratification - 12/15/15 1432      Activity Barriers & Cardiac Risk Stratification   Activity Barriers Arthritis  Right shoulder,left knee arthritis very painful. myeloma causing pain in legs sometimes sever   Cardiac Risk Stratification High      6 Minute Walk:  6 Minute Walk    Row Name 12/15/15 1504         6 Minute Walk   Phase Initial     Distance 500 feet     Walk Time 6 minutes     # of Rest Breaks 0     MPH 0.95     METS 1.7     RPE 13     Perceived Dyspnea  3     VO2 Peak 3.72     Symptoms Yes (comment)     Comments SOB, knee pain 6/10     Resting HR 58 bpm     Resting BP 130/66     Max Ex. HR 102 bpm     Max Ex. BP 130/64     2 Minute Post BP 124/56        Initial Exercise Prescription:     Initial Exercise Prescription - 12/15/15 1500      Date of Initial Exercise RX and Referring Provider   Date 12/15/15   Referring Provider Harle Stanford MD     NuStep   Level 1   Watts --  60-80 spm   Minutes 15   METs 1.7     Arm Ergometer   Level 1   Watts --  25-35 rpm   Minutes 15   METs 1.7     Track   Laps 15   Minutes 15   METs 1.7     Prescription Details   Frequency (times per week) 2   Duration Progress to 45 minutes of aerobic exercise without signs/symptoms of physical distress     Intensity   THRR 40-80% of Max Heartrate 93-128   Ratings of Perceived Exertion 11-13   Perceived Dyspnea 0-4     Progression   Progression Continue to progress workloads to maintain intensity without signs/symptoms of physical distress.     Resistance Training   Training Prescription Yes   Weight 1 lbs   Reps 10-12      Perform Capillary Blood Glucose checks as needed.  Exercise Prescription Changes:     Exercise Prescription Changes    Row Name 12/15/15 1500             Exercise Review   Progression -  walk test results         Response to Exercise   Blood Pressure (Admit) 130/66       Blood Pressure (Exercise) 130/64       Blood Pressure (Exit) 124/56       Heart Rate (Admit) 58 bpm       Heart Rate (Exercise) 91 bpm       Heart Rate (Exit) 57 bpm       Oxygen Saturation (Admit) 93 %       Oxygen Saturation (Exercise) 100 %       Rating of Perceived Exertion (Exercise) 13       Symptoms SOB, knee pain 6/10          Exercise Comments:     Exercise Comments    Row Name 12/15/15 1508           Exercise Comments Biggest exercise goal is to increase her stamina to get back to doing things and able to go out with her friends.          Discharge Exercise Prescription (Final Exercise Prescription Changes):     Exercise Prescription Changes - 12/15/15 1500      Exercise  Review   Progression --  walk test results     Response to Exercise   Blood Pressure (Admit) 130/66   Blood Pressure (Exercise) 130/64   Blood Pressure (Exit) 124/56   Heart Rate (Admit) 58 bpm   Heart Rate  (Exercise) 91 bpm   Heart Rate (Exit) 57 bpm   Oxygen Saturation (Admit) 93 %   Oxygen Saturation (Exercise) 100 %   Rating of Perceived Exertion (Exercise) 13   Symptoms SOB, knee pain 6/10      Nutrition:  Target Goals: Understanding of nutrition guidelines, daily intake of sodium <1549m, cholesterol <2071m calories 30% from fat and 7% or less from saturated fats, daily to have 5 or more servings of fruits and vegetables.  Biometrics:     Pre Biometrics - 12/15/15 1509      Pre Biometrics   Height 5' 2.1" (1.577 m)   Weight 166 lb (75.3 kg)   Waist Circumference 35 inches   Hip Circumference 44.5 inches   Waist to Hip Ratio 0.79 %   BMI (Calculated) 30.3   Single Leg Stand 0 seconds       Nutrition Therapy Plan and Nutrition Goals:     Nutrition Therapy & Goals - 12/15/15 1420      Intervention Plan   Intervention Prescribe, educate and counsel regarding individualized specific dietary modifications aiming towards targeted core components such as weight, hypertension, lipid management, diabetes, heart failure and other comorbidities.   Expected Outcomes Short Term Goal: Understand basic principles of dietary content, such as calories, fat, sodium, cholesterol and nutrients.;Short Term Goal: A plan has been developed with personal nutrition goals set during dietitian appointment.;Long Term Goal: Adherence to prescribed nutrition plan.      Nutrition Discharge: Rate Your Plate Scores:     Nutrition Assessments - 12/15/15 1420      Rate Your Plate Scores   Pre Score 60   Pre Score % 66.6 %      Nutrition Goals Re-Evaluation:   Psychosocial: Target Goals: Acknowledge presence or absence of depression, maximize coping skills, provide positive support system. Participant is able to verbalize types and ability to use techniques and skills needed for reducing stress and depression.  Initial Review & Psychosocial Screening:     Initial Psych Review & Screening -  12/15/15 1425      Initial Review   Current issues with Current Sleep Concerns  Sleep concerns since surgery     Family Dynamics   Good Support System? --  Son,daughter and friends     Barriers   Psychosocial barriers to participate in program There are no identifiable barriers or psychosocial needs.;The patient should benefit from training in stress management and relaxation.     Screening Interventions   Interventions Encouraged to exercise      Quality of Life Scores:     Quality of Life - 12/15/15 1428      Quality of Life Scores   Health/Function Pre 13.53 %   Socioeconomic Pre 18.6 %   Psych/Spiritual Pre 21 %   Family Pre 15.3 %   GLOBAL Pre 16.08 %      PHQ-9: Recent Review Flowsheet Data    [facility]   Decreased Interest 0   Down, Depressed, Hopeless 0   PHQ - 2 Score 0   Altered sleeping 3    Tired, decreased energy 2    Change in appetite 0   Feeling bad or failure about yourself  0  Trouble concentrating 0   Moving slowly or fidgety/restless 0   Suicidal thoughts 0   PHQ-9 Score 5      Psychosocial Evaluation and Intervention:   Psychosocial Re-Evaluation:   Vocational Rehabilitation: Provide vocational rehab assistance to qualifying candidates.   Vocational Rehab Evaluation & Intervention:     Vocational Rehab - 12/15/15 1443      Initial Vocational Rehab Evaluation & Intervention   Assessment shows need for Vocational Rehabilitation No      Education: Education Goals: Education classes will be provided on a weekly basis, covering required topics. Participant will state understanding/return demonstration of topics presented.  Learning Barriers/Preferences:     Learning Barriers/Preferences - 12/15/15 1441      Learning Barriers/Preferences   Learning Barriers None   Learning Preferences Verbal Instruction      Education Topics: General Nutrition Guidelines/Fats and Fiber: -Group instruction  provided by verbal, written material, models and posters to present the general guidelines for heart healthy nutrition. Gives an explanation and review of dietary fats and fiber.   Controlling Sodium/Reading Food Labels: -Group verbal and written material supporting the discussion of sodium use in heart healthy nutrition. Review and explanation with models, verbal and written materials for utilization of the food label.   Exercise Physiology & Risk Factors: - Group verbal and written instruction with models to review the exercise physiology of the cardiovascular system and associated critical values. Details cardiovascular disease risk factors and the goals associated with each risk factor.   Aerobic Exercise & Resistance Training: - Gives group verbal and written discussion on the health impact of inactivity. On the components of aerobic and resistive training programs and the benefits of this training and how to safely progress through these programs.   Flexibility, Balance, General Exercise Guidelines: - Provides group verbal and written instruction on the benefits of flexibility and balance training programs. Provides general exercise guidelines with specific guidelines to those with heart or lung disease. Demonstration and skill practice provided.   Stress Management: - Provides group verbal and written instruction about the health risks of elevated stress, cause of high stress, and healthy ways to reduce stress.   Depression: - Provides group verbal and written instruction on the correlation between heart/lung disease and depressed mood, treatment options, and the stigmas associated with seeking treatment.   Anatomy & Physiology of the Heart: - Group verbal and written instruction and models provide basic cardiac anatomy and physiology, with the coronary electrical and arterial systems. Review of: AMI, Angina, Valve disease, Heart Failure, Cardiac Arrhythmia, Pacemakers, and the  ICD.   Cardiac Procedures: - Group verbal and written instruction and models to describe the testing methods done to diagnose heart disease. Reviews the outcomes of the test results. Describes the treatment choices: Medical Management, Angioplasty, or Coronary Bypass Surgery.   Cardiac Medications: - Group verbal and written instruction to review commonly prescribed medications for heart disease. Reviews the medication, class of the drug, and side effects. Includes the steps to properly store meds and maintain the prescription regimen.   Go Sex-Intimacy & Heart Disease, Get SMART - Goal Setting: - Group verbal and written instruction through game format to discuss heart disease and the return to sexual intimacy. Provides group verbal and written material to discuss and apply goal setting through the application of the S.M.A.R.T. Method.   Other Matters of the Heart: - Provides group verbal, written materials and models to describe Heart Failure, Angina, Valve Disease, and Diabetes in the realm of  heart disease. Includes description of the disease process and treatment options available to the cardiac patient.   Exercise & Equipment Safety: - Individual verbal instruction and demonstration of equipment use and safety with use of the equipment. Flowsheet Row Cardiac Rehab from 12/15/2015 in [facility] Cardiac and Pulmonary Rehab  Date  12/15/15  Educator  SB  Instruction Review Code  2- meets goals/outcomes      Infection Prevention: - Provides verbal and written material to individual with discussion of infection control including proper hand washing and proper equipment cleaning during exercise session. Flowsheet Row Cardiac Rehab from 12/15/2015 in [facility] Cardiac and Pulmonary Rehab  Date  12/15/15  Educator  SB  Instruction Review Code  2- meets goals/outcomes      Falls Prevention: - Provides verbal and written material to individual with discussion of falls prevention and  safety. Flowsheet Row Cardiac Rehab from 12/15/2015 in [facility] Cardiac and Pulmonary Rehab  Date  12/15/15  Educator  SB  Instruction Review Code  2- meets goals/outcomes      Diabetes: - Individual verbal and written instruction to review signs/symptoms of diabetes, desired ranges of glucose level fasting, after meals and with exercise. Advice that pre and post exercise glucose checks will be done for 3 sessions at entry of program. Flowsheet Row Cardiac Rehab from 12/15/2015 in [facility] Cardiac and Pulmonary Rehab  Date  12/15/15  Educator  SB  Instruction Review Code  2- meets goals/outcomes       Knowledge Questionnaire Score:     Knowledge Questionnaire Score - 12/15/15 1442      Knowledge Questionnaire Score   Pre Score 18/28      Core Components/Risk Factors/Patient Goals at Admission:     Personal Goals and Risk Factors at Admission - 12/15/15 1443      Core Components/Risk Factors/Patient Goals on Admission    Weight Management Obesity;Yes   Intervention Weight Management: Develop a combined nutrition and exercise program designed to reach desired caloric intake, while maintaining appropriate intake of nutrient and fiber, sodium and fats, and appropriate energy expenditure required for the weight goal.;Weight Management: Provide education and appropriate resources to help participant work on and attain dietary goals.;Weight Management/Obesity: Establish reasonable short term and long term weight goals.   Admit Weight 166 lb (75.3 kg)   Goal Weight: Short Term 163 lb (73.9 kg)   Goal Weight: Long Term 140 lb (63.5 kg)   Expected Outcomes Short Term: Continue to assess and modify interventions until short term weight is achieved;Long Term: Adherence to nutrition and physical activity/exercise program aimed toward attainment of established weight goal;Weight Loss: Understanding of general recommendations for a balanced deficit meal plan, which promotes 1-2 lb weight loss per week  and includes a negative energy balance of [phone] kcal/d   Sedentary Yes   Intervention Develop an individualized exercise prescription for aerobic and resistive training based on initial evaluation findings, risk stratification, comorbidities and participant's personal goals.;Provide advice, education, support and counseling about physical activity/exercise needs.   Expected Outcomes Achievement of increased cardiorespiratory fitness and enhanced flexibility, muscular endurance and strength shown through measurements of functional capacity and personal statement of participant.   Increase Strength and Stamina Yes   Intervention Provide advice, education, support and counseling about physical activity/exercise needs.;Develop an individualized exercise prescription for aerobic and resistive training based on initial evaluation findings, risk stratification, comorbidities and participant's personal goals.   Expected Outcomes Achievement of increased cardiorespiratory fitness and enhanced flexibility, muscular endurance and strength shown  through measurements of functional capacity and personal statement of participant.   Diabetes Yes   Intervention Provide education about signs/symptoms and action to take for hypo/hyperglycemia.;Provide education about proper nutrition, including hydration, and aerobic/resistive exercise prescription along with prescribed medications to achieve blood glucose in normal ranges: Fasting glucose 65-99 mg/dL   Expected Outcomes Short Term: Participant verbalizes understanding of the signs/symptoms and immediate care of hyper/hypoglycemia, proper foot care and importance of medication, aerobic/resistive exercise and nutrition plan for blood glucose control.;Long Term: Attainment of HbA1C < 7%.   Hypertension Yes   Intervention Provide education on lifestyle modifcations including regular physical activity/exercise, weight management, moderate sodium restriction and increased  consumption of fresh fruit, vegetables, and low fat dairy, alcohol moderation, and smoking cessation.;Monitor prescription use compliance.   Expected Outcomes Short Term: Continued assessment and intervention until BP is < 140/73m HG in hypertensive participants. < 130/827mHG in hypertensive participants with diabetes, heart failure or chronic kidney disease.;Long Term: Maintenance of blood pressure at goal levels.   Lipids Yes   Intervention Provide education and support for participant on nutrition & aerobic/resistive exercise along with prescribed medications to achieve LDL '70mg'$ , HDL >'40mg'$ .   Expected Outcomes Short Term: Participant states understanding of desired cholesterol values and is compliant with medications prescribed. Participant is following exercise prescription and nutrition guidelines.;Long Term: Cholesterol controlled with medications as prescribed, with individualized exercise RX and with personalized nutrition plan. Value goals: LDL < '70mg'$ , HDL > 40 mg.      Core Components/Risk Factors/Patient Goals Review:    Core Components/Risk Factors/Patient Goals at Discharge (Final Review):    ITP Comments:     ITP Comments    Row Name 12/15/15 1416 12/17/15 0708 01/06/16 1608 01/13/16 [id number]   ITP Comments Medical review completed with initial ITP created. Diagnosis documentation can be found Care Everywhere [facility] post op visit 11/27/2015 30 day review completed for review by Dr MaEmily Filbert Continue with ITP unless changes noted by Dr MISabra Heck Will  return after wound is closed. JuBethena Royss on hold for open wound infection.  Awaiting clearance to start. 30 day review. Continue with ITP unless directed changes per Medical Director review. Remains absent secondary to infected incision and subsequent wound care,       Comments:

## 2016-01-13 NOTE — Progress Notes (Signed)
ANTICOAGULATION CONSULT NOTE - Follow-up Consult  Pharmacy Consult for Heparin Indication: chest pain/ACS  Allergies  Allergen Reactions  . Other Swelling    Smoke--Head swelling  . Amoxicillin   . Codeine   . Duloxetine Nausea Only  . Statins Swelling  . Sulfa Antibiotics     Patient Measurements: Height: 5\' 2"  (157.5 cm) Weight: 166 lb 0.1 oz (75.3 kg) IBW/kg (Calculated) : 50.1 Heparin Dosing Weight: 66kg (base on wt from early Dec 2017)  Vital Signs: Temp: 98.1 F (36.7 C) (01/02 2020) Temp Source: Oral (01/02 2020) BP: 90/39 (01/02 2345) Pulse Rate: 83 (01/02 2147)  Labs:  Recent Labs  01/13/16 0836 01/13/16 1514 01/13/16 1926 01/13/16 2211  HGB 11.4*  --   --   --   HCT 35.7*  --   --   --   PLT 198  --   --   --   HEPARINUNFRC  --   --   --  0.35  CREATININE 0.93  --   --   --   TROPONINI <0.03 <0.03 <0.03  --     Estimated Creatinine Clearance: 50.4 mL/min (by C-G formula based on SCr of 0.93 mg/dL).   Assessment: 76yo female with nausea and severe back pain which is her anginal equivalent.  She is s/p CABG in 10/2015 after NSTEMI/cath in 05/2015.  Pt on heparin for r/o ACS. Heparin level therapeutic (0.35) on gtt at 750 units/hr. No bleeding noted.  Goal of Therapy:  Heparin level 0.3-0.7 units/ml Monitor platelets by anticoagulation protocol: Yes   Plan:  Continue heparin 750 units/hr Will f/u daily heparin level and CBC  Sherlon Handing, PharmD, BCPS Clinical pharmacist, pager [phone] 01/13/2016 11:55 PM

## 2016-01-13 NOTE — ED Notes (Signed)
Walked patient to the bathroom patient did well pt resting

## 2016-01-13 NOTE — ED Notes (Signed)
Pt ambulated to restroom w walker

## 2016-01-13 NOTE — ED Notes (Signed)
Patient transported to CT 

## 2016-01-13 NOTE — Consult Note (Addendum)
[facility] Nurse wound consult note Reason for Consult: Consult requested for sternal wound.  Pt is well informed regarding topical treatment and is followed by the outpatient wound care center in [place]. Wound type: Full thickness post-op CABG incision Measurement: .8X.3X.5cm Wound bed: Dark red wound bed Drainage (amount, consistency, odor) Mod amt tan drainage, no odor Periwound: Closed incision line above full thickness wound without fluctuance or erythremia. Dressing procedure/placement/frequency: Continue present plan of care with Aqaucel packing to absorb drainage and provide antimicrobial benefits.  Pt can resume follow-up with the wound care center after discharge.Discussed plan of care with the patient and she denies further questions. Please re-consult if further assistance is needed.  Thank-you,  Julien Girt MSN, [place], [place], [place], [place]

## 2016-01-14 ENCOUNTER — Inpatient Hospital Stay (HOSPITAL_COMMUNITY): Payer: Medicare Other

## 2016-01-14 ENCOUNTER — Encounter (HOSPITAL_COMMUNITY)
Admission: EM | Disposition: A | Discharge status: Home / Self Care | Payer: Self-pay | Source: Home / Self Care | Attending: Cardiovascular Disease

## 2016-01-14 ENCOUNTER — Ambulatory Visit: Admitting: Internal Medicine

## 2016-01-14 DIAGNOSIS — R079 Chest pain, unspecified: Secondary | ICD-10-CM

## 2016-01-14 DIAGNOSIS — I200 Unstable angina: Secondary | ICD-10-CM

## 2016-01-14 DIAGNOSIS — I2510 Atherosclerotic heart disease of native coronary artery without angina pectoris: Secondary | ICD-10-CM

## 2016-01-14 DIAGNOSIS — E785 Hyperlipidemia, unspecified: Secondary | ICD-10-CM

## 2016-01-14 HISTORY — PX: CARDIAC CATHETERIZATION: SHX172

## 2016-01-14 LAB — GLUCOSE, CAPILLARY
GLUCOSE-CAPILLARY: 125 mg/dL — AB (ref 65–99)
GLUCOSE-CAPILLARY: 197 mg/dL — AB (ref 65–99)
GLUCOSE-CAPILLARY: 160 mg/dL — AB (ref 65–99)
GLUCOSE-CAPILLARY: 156 mg/dL — AB (ref 65–99)
GLUCOSE-CAPILLARY: 160 mg/dL — AB (ref 65–99)

## 2016-01-14 LAB — BASIC METABOLIC PANEL
ANION GAP: 9 (ref 5–15)
BUN: 14 mg/dL (ref 6–20)
CALCIUM: 9.4 mg/dL (ref 8.9–10.3)
CO2: 24 mmol/L (ref 22–32)
CREATININE: 1.25 mg/dL — AB (ref 0.44–1.00)
Chloride: 104 mmol/L (ref 101–111)
GFR, EST AFRICAN AMERICAN: 48 mL/min — AB (ref >60)
GFR, EST NON AFRICAN AMERICAN: 41 mL/min — AB (ref >60)
Glucose, Bld: 127 mg/dL — ABNORMAL HIGH (ref 65–99)
Potassium: 3.7 mmol/L (ref 3.5–5.1)
SODIUM: 137 mmol/L (ref 135–145)

## 2016-01-14 LAB — CBC
HCT: 31.9 % — ABNORMAL LOW (ref 36.0–46.0)
HEMOGLOBIN: 10.3 g/dL — AB (ref 12.0–15.0)
MCH: 26.3 pg (ref 26.0–34.0)
MCHC: 32.3 g/dL (ref 30.0–36.0)
MCV: 81.4 fL (ref 78.0–100.0)
PLATELETS: 220 10*3/uL (ref 150–400)
RBC: 3.92 MIL/uL (ref 3.87–5.11)
RDW: 14 % (ref 11.5–15.5)
WBC: 10.6 10*3/uL — ABNORMAL HIGH (ref 4.0–10.5)

## 2016-01-14 LAB — LIPID PANEL
Cholesterol: 207 mg/dL — ABNORMAL HIGH (ref 0–200)
HDL: 31 mg/dL — ABNORMAL LOW (ref >40)
LDL CALC: 133 mg/dL — AB (ref 0–99)
Total CHOL/HDL Ratio: 6.7 RATIO
Triglycerides: 214 mg/dL — ABNORMAL HIGH (ref <150)
VLDL: 43 mg/dL — AB (ref 0–40)

## 2016-01-14 LAB — TROPONIN I: Troponin I: <0.03 ng/mL (ref <0.03)

## 2016-01-14 LAB — HEPARIN LEVEL (UNFRACTIONATED)
HEPARIN UNFRACTIONATED: 0.27 [IU]/mL — AB (ref 0.30–0.70)
HEPARIN UNFRACTIONATED: 0.35 [IU]/mL (ref 0.30–0.70)

## 2016-01-14 LAB — PROTIME-INR
INR: 1.06
Prothrombin Time: 13.8 seconds (ref 11.4–15.2)

## 2016-01-14 LAB — POCT ACTIVATED CLOTTING TIME: ACTIVATED CLOTTING TIME: 120 s

## 2016-01-14 SURGERY — LEFT HEART CATH AND CORS/GRAFTS ANGIOGRAPHY

## 2016-01-14 MED ORDER — MIDAZOLAM HCL 2 MG/2ML IJ SOLN
INTRAMUSCULAR | Status: AC
  Filled 2016-01-14: qty 2

## 2016-01-14 MED ORDER — NITROGLYCERIN 0.4 MG SL SUBL
SUBLINGUAL_TABLET | SUBLINGUAL | Status: AC
  Filled 2016-01-14: qty 1

## 2016-01-14 MED ORDER — SODIUM CHLORIDE 0.9 % IV SOLN: 250.0000 mL | INTRAVENOUS | Status: DC | PRN

## 2016-01-14 MED ORDER — FENTANYL CITRATE (PF) 100 MCG/2ML IJ SOLN
INTRAMUSCULAR | Status: DC | PRN
  Administered 2016-01-14: 25 ug via INTRAVENOUS

## 2016-01-14 MED ORDER — IOPAMIDOL (ISOVUE-370) INJECTION 76%
INTRAVENOUS | Status: DC | PRN
  Administered 2016-01-14: 90 mL via INTRA_ARTERIAL

## 2016-01-14 MED ORDER — REGADENOSON 0.4 MG/5ML IV SOLN
0.4000 mg | Freq: Once | INTRAVENOUS | Status: AC
  Administered 2016-01-14: 10:00:00 0.4 mg via INTRAVENOUS
  Filled 2016-01-14: qty 5

## 2016-01-14 MED ORDER — REGADENOSON 0.4 MG/5ML IV SOLN
INTRAVENOUS | Status: AC
  Filled 2016-01-14: qty 5

## 2016-01-14 MED ORDER — SODIUM CHLORIDE 0.9 % IV SOLN
INTRAVENOUS | Status: AC
  Administered 2016-01-14: 15:00:00 via INTRAVENOUS

## 2016-01-14 MED ORDER — TECHNETIUM TC 99M TETROFOSMIN IV KIT
30.0000 | PACK | Freq: Once | INTRAVENOUS | Status: AC | PRN
  Administered 2016-01-14: 10:00:00 30 via INTRAVENOUS

## 2016-01-14 MED ORDER — LIDOCAINE HCL (PF) 1 % IJ SOLN
INTRAMUSCULAR | Status: AC
  Filled 2016-01-14: qty 30

## 2016-01-14 MED ORDER — FENTANYL CITRATE (PF) 100 MCG/2ML IJ SOLN
INTRAMUSCULAR | Status: AC
  Filled 2016-01-14: qty 2

## 2016-01-14 MED ORDER — HYDROCORTISONE 1 % EX CREA
TOPICAL_CREAM | CUTANEOUS | Status: DC | PRN
  Administered 2016-01-14: 23:00:00 via TOPICAL
  Filled 2016-01-14: qty 28

## 2016-01-14 MED ORDER — SODIUM CHLORIDE 0.9% FLUSH
3.0000 mL | Freq: Two times a day (BID) | INTRAVENOUS | Status: DC
  Administered 2016-01-14: 17:00:00 3 mL via INTRAVENOUS

## 2016-01-14 MED ORDER — NITROGLYCERIN 0.4 MG SL SUBL
SUBLINGUAL_TABLET | SUBLINGUAL | Status: AC
  Filled 2016-01-14: qty 2

## 2016-01-14 MED ORDER — MIDAZOLAM HCL 2 MG/2ML IJ SOLN
INTRAMUSCULAR | Status: DC | PRN
  Administered 2016-01-14: 1 mg via INTRAVENOUS

## 2016-01-14 MED ORDER — HEPARIN (PORCINE) IN NACL 2-0.9 UNIT/ML-% IJ SOLN
INTRAMUSCULAR | Status: AC
  Filled 2016-01-14: qty 1000

## 2016-01-14 MED ORDER — SODIUM CHLORIDE 0.9% FLUSH: 3.0000 mL | Freq: Two times a day (BID) | INTRAVENOUS | Status: DC

## 2016-01-14 MED ORDER — HEPARIN (PORCINE) IN NACL 2-0.9 UNIT/ML-% IJ SOLN
INTRAMUSCULAR | Status: DC | PRN
Start: 2016-01-14 — End: 2016-01-14
  Administered 2016-01-14: 1000 mL

## 2016-01-14 MED ORDER — IOPAMIDOL (ISOVUE-370) INJECTION 76%
INTRAVENOUS | Status: AC
  Filled 2016-01-14: qty 125

## 2016-01-14 MED ORDER — SODIUM CHLORIDE 0.9 % IV SOLN
INTRAVENOUS | Status: DC
  Administered 2016-01-14: 12:00:00 via INTRAVENOUS

## 2016-01-14 MED ORDER — LIDOCAINE HCL (PF) 1 % IJ SOLN
INTRAMUSCULAR | Status: DC | PRN
  Administered 2016-01-14: 13 mL via INTRADERMAL

## 2016-01-14 MED ORDER — TECHNETIUM TC 99M TETROFOSMIN IV KIT
10.0000 | PACK | Freq: Once | INTRAVENOUS | Status: AC | PRN
  Administered 2016-01-14: 10 via INTRAVENOUS

## 2016-01-14 MED ORDER — SODIUM CHLORIDE 0.9% FLUSH: 3.0000 mL | INTRAVENOUS | Status: DC | PRN

## 2016-01-14 SURGICAL SUPPLY — 8 items
CATH EXPO 5F IM (CATHETERS) ×2 IMPLANT
CATH INFINITI 5FR MULTPACK ANG (CATHETERS) ×2 IMPLANT
KIT HEART LEFT (KITS) ×3 IMPLANT
PACK CARDIAC CATHETERIZATION (CUSTOM PROCEDURE TRAY) ×3 IMPLANT
SHEATH PINNACLE 5F 10CM (SHEATH) ×2 IMPLANT
TRANSDUCER W/STOPCOCK (MISCELLANEOUS) ×3 IMPLANT
TUBING CIL FLEX 10 FLL-RA (TUBING) ×3 IMPLANT
WIRE EMERALD 3MM-J .035X150CM (WIRE) ×2 IMPLANT

## 2016-01-14 NOTE — Interval H&P Note (Signed)
History and Physical Interval Note:  01/14/2016 1:32 PM  [name]  has presented today for cardiac cath with the diagnosis of unstable angina. The various methods of treatment have been discussed with the patient and family. After consideration of risks, benefits and other options for treatment, the patient has consented to  Procedure(s): Left Heart Cath and Cors/Grafts Angiography (N/A) as a surgical intervention .  The patient's history has been reviewed, patient examined, no change in status, stable for surgery.  I have reviewed the patient's chart and labs.  Questions were answered to the patient's satisfaction.    Cath Lab Visit (complete for each Cath Lab visit)  Clinical Evaluation Leading to the Procedure:   ACS: No.  Non-ACS:    Anginal Classification: CCS III  Anti-ischemic medical therapy: Minimal Therapy (1 class of medications)  Non-Invasive Test Results: No non-invasive testing performed  Prior CABG: Previous CABG         [name]

## 2016-01-14 NOTE — Progress Notes (Signed)
ANTICOAGULATION CONSULT NOTE - Follow Up Consult  Pharmacy Consult for Heparin Indication: chest pain/ACS  Allergies  Allergen Reactions  . Other Swelling    Smoke--Head swelling  . Amoxicillin   . Codeine   . Duloxetine Nausea Only  . Statins Swelling  . Sulfa Antibiotics     Patient Measurements: Height: 5\' 2"  (157.5 cm) Weight: 166 lb 0.1 oz (75.3 kg) IBW/kg (Calculated) : 50.1  Vital Signs: Temp: 98 F (36.7 C) (01/03 1200) Temp Source: Oral (01/03 1200) BP: 126/47 (01/03 1200) Pulse Rate: 98 (01/03 1200)  Labs:  Recent Labs  01/13/16 0836 01/13/16 1514 01/13/16 1926 01/13/16 2211 01/14/16 0202 01/14/16 1148  HGB 11.4*  --   --   --   --   --   HCT 35.7*  --   --   --   --   --   PLT 198  --   --   --   --   --   LABPROT  --   --   --   --  13.8  --   INR  --   --   --   --  1.06  --   HEPARINUNFRC  --   --   --  0.35 0.27* 0.35  CREATININE 0.93  --   --   --  1.25*  --   TROPONINI <0.03 <0.03 <0.03  --  <0.03  --     Estimated Creatinine Clearance: 37.5 mL/min (by C-G formula based on SCr of 1.25 mg/dL (H)).   Medications:  Scheduled:  . aspirin EC  81 mg Oral Daily  . clopidogrel  75 mg Oral Daily  . ezetimibe  10 mg Oral Daily  . feeding supplement  1 Container Oral TID BM  . fentaNYL  25 mcg Transdermal Q72H  . gabapentin  300 mg Oral QHS  . gabapentin  600 mg Oral q morning - 10a  . insulin aspart  0-9 Units Subcutaneous TID WC  . isosorbide mononitrate  30 mg Oral Daily  . lisinopril  5 mg Oral Daily  . metFORMIN  500 mg Oral BID WC  . metoprolol  25 mg Oral BID  . nitroGLYCERIN      . nitroGLYCERIN      . regadenoson      . sodium chloride flush  3 mL Intravenous Q12H    Assessment: 76yo female with chest pain, s/p stress test this AM with episodes of chest pain and now an add-on for cath today.  Heparin level therapeutic.  No CBC this AM though ordered to start today.  No bleedint noted.  Goal of Therapy:  Heparin level 0.3-0.7  units/ml Monitor platelets by anticoagulation protocol: Yes   Plan:  Continue heparin 850 units/hr Check heparin level in 6hr to verify & get CBC with this Daily heparin level, CBC Watch for s/s of bleeding   Gracy Bruins, PharmD Clinical Pharmacist [facility]

## 2016-01-14 NOTE — Progress Notes (Signed)
Pt states pain is 5/10 after 1 nitro sl. Awake and alert. In no distress

## 2016-01-14 NOTE — Progress Notes (Signed)
Pain 4/10 after 2nd Nitroglycerin

## 2016-01-14 NOTE — Progress Notes (Signed)
Report given to Clair Gulling, RN on 6 floor. Pt to go back up to room and possible cath today. Pt states her pain in 4/10.

## 2016-01-14 NOTE — Progress Notes (Signed)
Site area: Right groin a 5 french arterial sheath was removed  Site Prior to Removal:  Level 0  Pressure Applied For 15 MINUTES    Bedrest Beginning at 1445p  Manual:   Yes.    Patient Status During Pull:  stable  Post Pull Groin Site:  Level 0  Post Pull Instructions Given:  Yes.    Post Pull Pulses Present:  Yes.    Dressing Applied:  Yes.    Comments:  VS remain stable during sheath pull

## 2016-01-14 NOTE — Progress Notes (Signed)
Pt complaining of left jaw pain and back pain of 8/10. Pt back on CR monitor. PA at bedside.

## 2016-01-14 NOTE — Progress Notes (Signed)
Dr. Claiborne Billings at bedside. Pt to finish pictures and then transfer upstairs.. Pt with pain 0/10 on chest. Now complaining of headache. Awake and alert. In no distress.

## 2016-01-14 NOTE — Progress Notes (Signed)
ANTICOAGULATION CONSULT NOTE - Follow Up Consult  Pharmacy Consult for heparin Indication: chest pain/ACS  Labs:  Recent Labs  01/13/16 0836 01/13/16 1514 01/13/16 1926 01/13/16 2211 01/14/16 0202  HGB 11.4*  --   --   --   --   HCT 35.7*  --   --   --   --   PLT 198  --   --   --   --   LABPROT  --   --   --   --  13.8  INR  --   --   --   --  1.06  HEPARINUNFRC  --   --   --  0.35 0.27*  CREATININE 0.93  --   --   --  1.25*  TROPONINI <0.03 <0.03 <0.03  --  <0.03    Assessment: 76yo female now subtherapeutic on heparin after one level at low end of goal.  Goal of Therapy:  Heparin level 0.3-0.7 units/ml   Plan:  Will increase heparin gtt by 1-2 units/kg/hr to 850 units/hr and check level in [place], PharmD, BCPS  01/14/2016,3:05 AM

## 2016-01-14 NOTE — Progress Notes (Signed)
 Patient Name: [name] Date of Encounter: 01/14/2016  Primary Cardiologist: Dr. Nelson  Hospital Problem List     Active Problems:   Unstable angina (HCC)   Other chest pain   PVD (peripheral vascular disease) (HCC)   Coronary artery disease involving coronary bypass graft of native heart with angina pectoris (HCC)    Subjective   Feeling well this morning. No chest pain.   Inpatient Medications    Scheduled Meds: . aspirin EC  81 mg Oral Daily  . clopidogrel  75 mg Oral Daily  . ezetimibe  10 mg Oral Daily  . feeding supplement  1 Container Oral TID BM  . fentaNYL  25 mcg Transdermal Q72H  . gabapentin  300 mg Oral QHS  . gabapentin  600 mg Oral q morning - 10a  . insulin aspart  0-9 Units Subcutaneous TID WC  . isosorbide mononitrate  30 mg Oral Daily  . lisinopril  5 mg Oral Daily  . metFORMIN  500 mg Oral BID WC  . metoprolol  25 mg Oral BID  . regadenoson       Continuous Infusions: . DOPamine    . heparin 850 Units/hr (01/14/16 0307)   PRN Meds: acetaminophen, ALPRAZolam, HYDROmorphone, nitroGLYCERIN, ondansetron (ZOFRAN) IV, promethazine   Vital Signs    Vitals:   01/14/16 0700 01/14/16 0918 01/14/16 0934 01/14/16 0935  BP: (!) 102/29 (!) 133/55 129/61 (!) 119/51  Pulse: 69     Resp: 11     Temp:      TempSrc: Oral     SpO2:      Weight:      Height:        Intake/Output Summary (Last 24 hours) at 01/14/16 0936 Last data filed at 01/14/16 0500  Gross per 24 hour  Intake           223.52 ml  Output              400 ml  Net          -176.48 ml   Filed Weights   01/13/16 2300  Weight: 166 lb 0.1 oz (75.3 kg)    Physical Exam   GEN: Well nourished, well developed, in no acute distress.  HEENT: Grossly normal.  Neck: Supple, no JVD, carotid bruits, or masses. Cardiac: RRR, no murmurs, rubs, or gallops. No clubbing, cyanosis, edema.  Radials/DP/PT 2+ and equal bilaterally.  Respiratory:  Respirations regular and unlabored, clear  to auscultation bilaterally. GI: Soft, nontender, nondistended, BS + x 4. MS: no deformity or atrophy. Skin: warm and dry, no rash. Neuro:  Strength and sensation are intact. Psych: AAOx3.  Normal affect.  Labs    CBC  Recent Labs  01/13/16 0836  WBC 9.2  HGB 11.4*  HCT 35.7*  MCV 82.1  PLT 198   Basic Metabolic Panel  Recent Labs  01/13/16 0836 01/14/16 0202  NA 135 137  K 3.9 3.7  CL 104 104  CO2 22 24  GLUCOSE 176* 127*  BUN 10 14  CREATININE 0.93 1.25*  CALCIUM 9.3 9.4   Liver Function Tests No results for input(s): AST, ALT, ALKPHOS, BILITOT, PROT, ALBUMIN in the last 72 hours. No results for input(s): LIPASE, AMYLASE in the last 72 hours. Cardiac Enzymes  Recent Labs  01/13/16 1514 01/13/16 1926 01/14/16 0202  TROPONINI <0.03 <0.03 <0.03   BNP Invalid input(s): POCBNP D-Dimer No results for input(s): DDIMER in the last 72 hours. Hemoglobin A1C No results   for input(s): HGBA1C in the last 72 hours. Fasting Lipid Panel  Recent Labs  01/14/16 0202  CHOL 207*  HDL 31*  LDLCALC 133*  TRIG 214*  CHOLHDL 6.7   Thyroid Function Tests No results for input(s): TSH, T4TOTAL, T3FREE, THYROIDAB in the last 72 hours.  Invalid input(s): FREET3  Telemetry    SR - Personally Reviewed  ECG    SR with old inferior and anterior infarcts  - Personally Reviewed  Radiology    Dg Chest 2 View  Result Date: 01/13/2016 CLINICAL DATA:  Shortness of breath, nausea, and upper back pain beginning at 4 a.m. this morning. History of previous MI, multiple myeloma, diabetes. EXAM: CHEST  2 VIEW COMPARISON:  Chest x-ray of November 29, 2015 FINDINGS: The the lungs are adequately inflated. There is no focal infiltrate. Previously demonstrated pleural effusions have resolved. The heart and pulmonary vascularity are normal. There is calcification in the wall of the aortic arch. The patient has undergone previous CABG. The bony thorax exhibits no acute abnormality.  IMPRESSION: There is no acute cardiopulmonary abnormality.  Previous CABG. Thoracic aortic atherosclerosis. Electronically Signed   By: David  Jordan M.D.   On: 01/13/2016 08:59   Ct Angio Chest/abd/pel For Dissection W And/or W/wo  Result Date: 01/13/2016 CLINICAL DATA:  75-year-old female with sudden onset severe centralized back pain with nausea and vomiting since 0400 hours. Initial encounter. EXAM: CT ANGIOGRAPHY CHEST, ABDOMEN AND PELVIS TECHNIQUE: Multidetector CT imaging through the chest, abdomen and pelvis was performed using the standard protocol during bolus administration of intravenous contrast. Multiplanar reconstructed images and MIPs were obtained and reviewed to evaluate the vascular anatomy. CONTRAST:  100 mL Isovue 370 COMPARISON:  Chest radiographs 751 hours today and earlier. Cervical spine CT 07/26/2015. FINDINGS: CTA CHEST FINDINGS Cardiovascular: Extensive calcified aortic and coronary artery atherosclerosis. Sequelae of CABG. Mild cardiomegaly. No pericardial effusion. No thoracic aortic aneurysm or dissection. Proximal great vessels are patent. There is also adequate contrast in the central pulmonary arteries. No pulmonary artery filling defect identified. Mediastinum/Nodes: No mediastinal or hilar lymphadenopathy. Lungs/Pleura: Major airways are patent. There is a small layering left pleural effusion. There is mild associated left lung compressive atelectasis. There is mild superimposed bilateral dependent pulmonary atelectasis. Small subpleural nodule along the right minor fissure on series 7, image 70 has a benign / postinflammatory appearance. No other abnormal pulmonary opacity . Musculoskeletal: Prior median sternotomy. No acute osseous abnormality identified. Review of the MIP images confirms the above findings. CTA ABDOMEN AND PELVIS FINDINGS VASCULAR Extensive Aortoiliac calcified atherosclerosis. No abdominal aortic aneurysm or dissection. Major aortic branches including the  celiac, SMA, bilateral renal arteries, and IMA are patent. Both external iliac arteries are patent. There is poor flow or occlusion of the distal most left internal iliac artery branches. The right internal iliac artery is appear patent. Calcified atherosclerosis continues into the bilateral femoral arteries. There is a patent bypass graft of the left common femoral artery extending distally. There is an occluded left SFA (Series 6, image 285). The visible left PFA is patent. Stent there is a patent stent of the right SFA (series 6, image 299). The visible right PFA is patent. Review of the MIP images confirms the above findings. NON-VASCULAR Hepatobiliary: No abdominal free air or free fluid identified. Borderline to mild hepatic steatosis. Otherwise negative liver and gallbladder. No biliary ductal enlargement. Pancreas: Negative. Spleen: Negative. Adrenals/Urinary Tract: Minimally nodular left adrenal gland but overall preserved left adreniform shape. Negative right adrenal gland. Bilateral   renal enhancement appears symmetric. No hydronephrosis or hydroureter. No definite nephrolithiasis. Small volume of gas in the non dependent urinary bladder (Series 6, image 263). The bladder otherwise appears unremarkable. Stomach/Bowel: Gas distended rectum appears normal. There is moderate to severe diverticulosis throughout the sigmoid colon, and the sigmoid wall appears indistinct. There does appear to be subtle mesenteric stranding about the mid sigmoid colon as seen on series 9, image 70. No extraluminal gas. Diverticulosis continues in the left colon with no definite active inflammation. Mild diverticulosis at the hepatic flexure with otherwise negative transverse colon and ascending colon. Negative cecum and appendix. Negative terminal ileum. No dilated small bowel. Sequelae of gastric banding. The proximal portion of the stomach is nondilated. There is gas and fluid in the more distal stomach which appears within  normal limits. Incidental diverticulum of the second portion of the duodenum. The duodenum otherwise appears normal. Lymphatic: Maximal lymph nodes in the retroperitoneum (such as on series 6, image 163). No lymphadenopathy elsewhere in the abdomen or pelvis. Reproductive: Surgically absent uterus. The right adnexa appears normal on series 6, image 247. The left is diminutive or absent. Other: Small volume of pelvic free fluid (series 6, image 258). 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 Vacuum disc at the adjacent lower lumbar segment. No acute osseous abnormality identified. Review of the MIP images confirms the above findings. IMPRESSION: 1. Extensive aortic and other major arterial structure atherosclerosis. No aortic aneurysm or dissection. Prior CABG. No central or hilar pulmonary artery embolus. Partially visible left femoral artery bypass graft is patent. Partially visible right SFA stent is patent. 2. Nonspecific small layering left pleural effusion and small volume pelvic free fluid. 3. With regard the pelvic fluid there is severe diverticulosis of the sigmoid colon and mild acute diverticulitis is difficult to exclude. No other bowel inflammation suspected. The appendix is normal. Prior gastric banding with no adverse features. 4. With regard to the small left pleural effusion there is only mild associated pulmonary atelectasis. 5. Small volume of gas in the urinary bladder, suspicious for acute UTI unless explained by recent catheterization. Electronically Signed   By: H  Hall M.D.   On: 01/13/2016 13:14    Cardiac Studies   TTE: Pending  Patient Profile     75 year old female with a past medical history of CAD s/p CABG, DM, HTN, PAD s/p L fem pop in 2013, multiple myeloma, and ischemic cardiomyopathy. She presented to the ED on 01/13/15 with severe back pain which is historically her anginal equivalent.   Assessment & Plan    1. Back pain with concerns for anginal  equivalent: Patient awakened with severe back pain that radiated to the left side of her back. This is her usual anginal equivalent although not documented as such. She is s/p CABG (LIMA - LAD, SVG-OM) in October 2017 after having a NSTEMI in May 2017 with DES x 3 placed to her LAD and OM2. She was compliant with DAPT according to records. CTA was neg for dissection.  Troponin neg x3, will await Echo results. Seen in Nuc Med, images pending.  2. History of CAD s/p CABG  3. DM: SSI.     4. History of multiple myeloma: On home medications. She sees Dr. Long at [facility] for this.   5. Sternal wound: Patient expresses that she is being treated for a wound infection at her CABG site. WOC has seen.  6. Ischemic cardiomyopathy: Last EF 45-50%, continue beta blocker and ACE-I.      Addendum: After completing stress test, patient was taken to the holding area to wait for second imaging. She developed a sudden onset of severe back pain, chest pain and bilateral jaw pain rating 8/10. She was taken back and placed on EKG noting Worsening ST depression in the lateral leads. Also became hypertensive. Given a total of 3SL nitro and pain/pressure did resolve. Discussed with Dr. Haily Caley who came to the bedside. Plan to cath today as an add on. She was pain free after completion of 3 SL nitro. Vital signs improved.   Signed, Lindsay Roberts, NP  01/14/2016, 9:36 AM   Patient seen and examined. Agree with assessment and plan.  Ms. Colbaugh developed her similar anginal type symptomatology with back pain radiating to her jaw following her pharmacologic stress test.  This was associated with mild additional T-wave abnormalities in leads 1 and L. ,  She became hypertensive.  She received 3 subluminal nitroglycerin with resolution of symptomatology.  She is 3 months status post CABG surgery with a LIMA to her LAD and vein graft were marginal vessel which was done at [facility].  When I saw her yesterday, I discussed the importance  that she will need aggressive lipid-lowering therapy and although she is statin intolerant or Zetia, she is a candidate for PCSK9 inhition.  With her symptomatology today with associated ECG changes worrisome for unstable angina, I have recommended definitive repeat cardiac catheterization and will schedule this test be done later this afternoon as schedule allows. The risks and benefits of a cardiac catheterization including, but not limited to, death, stroke, MI, kidney damage and bleeding were discussed with the patient who indicates understanding and agrees to proceed.    Lisset Ketchem A. Rexford Prevo, MD, FACC 01/14/2016 10:54 AM   

## 2016-01-14 NOTE — Progress Notes (Signed)
Pain 2/10. States she still has jaw pain. Waiting for Dr. Claiborne Billings to come see pt.

## 2016-01-14 NOTE — H&P (View-Only) (Signed)
Patient Name: [name] Date of Encounter: 01/14/2016  Primary Cardiologist: Dr. Rhona Raider Problem List     Active Problems:   Unstable angina [facility])   Other chest pain   PVD (peripheral vascular disease) ([place])   Coronary artery disease involving coronary bypass graft of native heart with angina pectoris ([place])    Subjective   Feeling well this morning. No chest pain.   Inpatient Medications    Scheduled Meds: . aspirin EC  81 mg Oral Daily  . clopidogrel  75 mg Oral Daily  . ezetimibe  10 mg Oral Daily  . feeding supplement  1 Container Oral TID BM  . fentaNYL  25 mcg Transdermal Q72H  . gabapentin  300 mg Oral QHS  . gabapentin  600 mg Oral q morning - 10a  . insulin aspart  0-9 Units Subcutaneous TID WC  . isosorbide mononitrate  30 mg Oral Daily  . lisinopril  5 mg Oral Daily  . metFORMIN  500 mg Oral BID WC  . metoprolol  25 mg Oral BID  . regadenoson       Continuous Infusions: . DOPamine    . heparin 850 Units/hr (01/14/16 0307)   PRN Meds: acetaminophen, ALPRAZolam, HYDROmorphone, nitroGLYCERIN, ondansetron (ZOFRAN) IV, promethazine   Vital Signs    Vitals:   01/14/16 0700 01/14/16 0918 01/14/16 0934 01/14/16 0935  BP: (!) 102/29 (!) 133/55 129/61 (!) 119/51  Pulse: 69     Resp: 11     Temp:      TempSrc: Oral     SpO2:      Weight:      Height:        Intake/Output Summary (Last 24 hours) at 01/14/16 0936 Last data filed at 01/14/16 0500  Gross per 24 hour  Intake           223.52 ml  Output              400 ml  Net          -176.48 ml   Filed Weights   01/13/16 2300  Weight: 166 lb 0.1 oz (75.3 kg)    Physical Exam   GEN: Well nourished, well developed, in no acute distress.  HEENT: Grossly normal.  Neck: Supple, no JVD, carotid bruits, or masses. Cardiac: RRR, no murmurs, rubs, or gallops. No clubbing, cyanosis, edema.  Radials/DP/PT 2+ and equal bilaterally.  Respiratory:  Respirations regular and unlabored, clear  to auscultation bilaterally. GI: Soft, nontender, nondistended, BS + x 4. MS: no deformity or atrophy. Skin: warm and dry, no rash. Neuro:  Strength and sensation are intact. Psych: AAOx3.  Normal affect.  Labs    CBC  Recent Labs  01/13/16 0836  WBC 9.2  HGB 11.4*  HCT 35.7*  MCV 82.1  PLT 944   Basic Metabolic Panel  Recent Labs  01/13/16 0836 01/14/16 0202  NA 135 137  K 3.9 3.7  CL 104 104  CO2 22 24  GLUCOSE 176* 127*  BUN 10 14  CREATININE 0.93 1.25*  CALCIUM 9.3 9.4   Liver Function Tests No results for input(s): AST, ALT, ALKPHOS, BILITOT, PROT, ALBUMIN in the last 72 hours. No results for input(s): LIPASE, AMYLASE in the last 72 hours. Cardiac Enzymes  Recent Labs  01/13/16 1514 01/13/16 1926 01/14/16 0202  TROPONINI <0.03 <0.03 <0.03   BNP Invalid input(s): POCBNP D-Dimer No results for input(s): DDIMER in the last 72 hours. Hemoglobin A1C No results  for input(s): HGBA1C in the last 72 hours. Fasting Lipid Panel  Recent Labs  01/14/16 0202  CHOL 207*  HDL 31*  LDLCALC 133*  TRIG 214*  CHOLHDL 6.7   Thyroid Function Tests No results for input(s): TSH, T4TOTAL, T3FREE, THYROIDAB in the last 72 hours.  Invalid input(s): FREET3  Telemetry    SR - Personally Reviewed  ECG    SR with old inferior and anterior infarcts  - Personally Reviewed  Radiology    Dg Chest 2 View  Result Date: 01/13/2016 CLINICAL DATA:  Shortness of breath, nausea, and upper back pain beginning at 4 a.m. this morning. History of previous MI, multiple myeloma, diabetes. EXAM: CHEST  2 VIEW COMPARISON:  Chest x-ray of November 29, 2015 FINDINGS: The the lungs are adequately inflated. There is no focal infiltrate. Previously demonstrated pleural effusions have resolved. The heart and pulmonary vascularity are normal. There is calcification in the wall of the aortic arch. The patient has undergone previous CABG. The bony thorax exhibits no acute abnormality.  IMPRESSION: There is no acute cardiopulmonary abnormality.  Previous CABG. Thoracic aortic atherosclerosis. Electronically Signed   By: David  Martinique M.D.   On: 01/13/2016 08:59   Ct Angio Chest/abd/pel For Dissection W And/or W/wo  Result Date: 01/13/2016 CLINICAL DATA:  76 year old female with sudden onset severe centralized back pain with nausea and vomiting since 0400 hours. Initial encounter. EXAM: CT ANGIOGRAPHY CHEST, ABDOMEN AND PELVIS TECHNIQUE: Multidetector CT imaging through the chest, abdomen and pelvis was performed using the standard protocol during bolus administration of intravenous contrast. Multiplanar reconstructed images and MIPs were obtained and reviewed to evaluate the vascular anatomy. CONTRAST:  100 mL Isovue 370 COMPARISON:  Chest radiographs 751 hours today and earlier. Cervical spine CT 07/26/2015. FINDINGS: CTA CHEST FINDINGS Cardiovascular: Extensive calcified aortic and coronary artery atherosclerosis. Sequelae of CABG. Mild cardiomegaly. No pericardial effusion. No thoracic aortic aneurysm or dissection. Proximal great vessels are patent. There is also adequate contrast in the central pulmonary arteries. No pulmonary artery filling defect identified. Mediastinum/Nodes: No mediastinal or hilar lymphadenopathy. Lungs/Pleura: Major airways are patent. There is a small layering left pleural effusion. There is mild associated left lung compressive atelectasis. There is mild superimposed bilateral dependent pulmonary atelectasis. Small subpleural nodule along the right minor fissure on series 7, image 70 has a benign / postinflammatory appearance. No other abnormal pulmonary opacity . Musculoskeletal: Prior median sternotomy. No acute osseous abnormality identified. Review of the MIP images confirms the above findings. CTA ABDOMEN AND PELVIS FINDINGS VASCULAR Extensive Aortoiliac calcified atherosclerosis. No abdominal aortic aneurysm or dissection. Major aortic branches including the  celiac, SMA, bilateral renal arteries, and IMA are patent. Both external iliac arteries are patent. There is poor flow or occlusion of the distal most left internal iliac artery branches. The right internal iliac artery is appear patent. Calcified atherosclerosis continues into the bilateral femoral arteries. There is a patent bypass graft of the left common femoral artery extending distally. There is an occluded left SFA (Series 6, image 285). The visible left PFA is patent. Stent there is a patent stent of the right SFA (series 6, image 299). The visible right PFA is patent. Review of the MIP images confirms the above findings. NON-VASCULAR Hepatobiliary: No abdominal free air or free fluid identified. Borderline to mild hepatic steatosis. Otherwise negative liver and gallbladder. No biliary ductal enlargement. Pancreas: Negative. Spleen: Negative. Adrenals/Urinary Tract: Minimally nodular left adrenal gland but overall preserved left adreniform shape. Negative right adrenal gland. Bilateral  renal enhancement appears symmetric. No hydronephrosis or hydroureter. No definite nephrolithiasis. Small volume of gas in the non dependent urinary bladder (Series 6, image 263). The bladder otherwise appears unremarkable. Stomach/Bowel: Gas distended rectum appears normal. There is moderate to severe diverticulosis throughout the sigmoid colon, and the sigmoid wall appears indistinct. There does appear to be subtle mesenteric stranding about the mid sigmoid colon as seen on series 9, image 70. No extraluminal gas. Diverticulosis continues in the left colon with no definite active inflammation. Mild diverticulosis at the hepatic flexure with otherwise negative transverse colon and ascending colon. Negative cecum and appendix. Negative terminal ileum. No dilated small bowel. Sequelae of gastric banding. The proximal portion of the stomach is nondilated. There is gas and fluid in the more distal stomach which appears within  normal limits. Incidental diverticulum of the second portion of the duodenum. The duodenum otherwise appears normal. Lymphatic: Maximal lymph nodes in the retroperitoneum (such as on series 6, image 163). No lymphadenopathy elsewhere in the abdomen or pelvis. Reproductive: Surgically absent uterus. The right adnexa appears normal on series 6, image 247. The left is diminutive or absent. Other: Small volume of pelvic free fluid (series 6, image 258). 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 Vacuum disc at the adjacent lower lumbar segment. No acute osseous abnormality identified. Review of the MIP images confirms the above findings. IMPRESSION: 1. Extensive aortic and other major arterial structure atherosclerosis. No aortic aneurysm or dissection. Prior CABG. No central or hilar pulmonary artery embolus. Partially visible left femoral artery bypass graft is patent. Partially visible right SFA stent is patent. 2. Nonspecific small layering left pleural effusion and small volume pelvic free fluid. 3. With regard the pelvic fluid there is severe diverticulosis of the sigmoid colon and mild acute diverticulitis is difficult to exclude. No other bowel inflammation suspected. The appendix is normal. Prior gastric banding with no adverse features. 4. With regard to the small left pleural effusion there is only mild associated pulmonary atelectasis. 5. Small volume of gas in the urinary bladder, suspicious for acute UTI unless explained by recent catheterization. Electronically Signed   By: Genevie Ann M.D.   On: 01/13/2016 13:14    Cardiac Studies   TTE: Pending  Patient Profile     75 year old female with a past medical history of CAD s/p CABG, DM, HTN, PAD s/p L fem pop in 2013, multiple myeloma, and ischemic cardiomyopathy. She presented to the ED on 01/13/15 with severe back pain which is historically her anginal equivalent.   Assessment & Plan    1. Back pain with concerns for anginal  equivalent: Patient awakened with severe back pain that radiated to the left side of her back. This is her usual anginal equivalent although not documented as such. She is s/p CABG (LIMA - LAD, SVG-OM) in October 2017 after having a NSTEMI in May 2017 with DES x 3 placed to her LAD and OM2. She was compliant with DAPT according to records. CTA was neg for dissection.  Troponin neg x3, will await Echo results. Seen in [place], images pending.  2. History of CAD s/p CABG  3. DM: SSI.     4. History of multiple myeloma: On home medications. She sees Dr. Laverta Baltimore at [facility] for this.   5. Sternal wound: Patient expresses that she is being treated for a wound infection at her CABG site. WOC has seen.  6. Ischemic cardiomyopathy: Last EF 45-50%, continue beta blocker and ACE-I.  Addendum: After completing stress test, patient was taken to the holding area to wait for second imaging. She developed a sudden onset of severe back pain, chest pain and bilateral jaw pain rating 8/10. She was taken back and placed on EKG noting Worsening ST depression in the lateral leads. Also became hypertensive. Given a total of 3SL nitro and pain/pressure did resolve. Discussed with Dr. Claiborne Billings who came to the bedside. Plan to cath today as an add on. She was pain free after completion of 3 SL nitro. Vital signs improved.   Signed, Reino Bellis, NP  01/14/2016, 9:36 AM   Patient seen and examined. Agree with assessment and plan.  Ms. Kondo developed her similar anginal type symptomatology with back pain radiating to her jaw following her pharmacologic stress test.  This was associated with mild additional T-wave abnormalities in leads 1 and L. ,  She became hypertensive.  She received 3 subluminal nitroglycerin with resolution of symptomatology.  She is 3 months status post CABG surgery with a LIMA to her LAD and vein graft were marginal vessel which was done at [facility].  When I saw her yesterday, I discussed the importance  that she will need aggressive lipid-lowering therapy and although she is statin intolerant or Zetia, she is a candidate for PCSK9 inhition.  With her symptomatology today with associated ECG changes worrisome for unstable angina, I have recommended definitive repeat cardiac catheterization and will schedule this test be done later this afternoon as schedule allows. The risks and benefits of a cardiac catheterization including, but not limited to, death, stroke, MI, kidney damage and bleeding were discussed with the patient who indicates understanding and agrees to proceed.    Troy Sine, MD, [facility] 01/14/2016 10:54 AM

## 2016-01-15 ENCOUNTER — Inpatient Hospital Stay (HOSPITAL_COMMUNITY): Payer: Medicare Other

## 2016-01-15 ENCOUNTER — Ambulatory Visit

## 2016-01-15 ENCOUNTER — Encounter (HOSPITAL_COMMUNITY): Payer: Self-pay | Admitting: Cardiovascular Disease

## 2016-01-15 DIAGNOSIS — I5042 Chronic combined systolic (congestive) and diastolic (congestive) heart failure: Secondary | ICD-10-CM

## 2016-01-15 LAB — BASIC METABOLIC PANEL
ANION GAP: 8 (ref 5–15)
BUN: 14 mg/dL (ref 6–20)
CO2: 23 mmol/L (ref 22–32)
Calcium: 9.1 mg/dL (ref 8.9–10.3)
Chloride: 106 mmol/L (ref 101–111)
Creatinine, Ser: 1.26 mg/dL — ABNORMAL HIGH (ref 0.44–1.00)
GFR, EST AFRICAN AMERICAN: 47 mL/min — AB (ref >60)
GFR, EST NON AFRICAN AMERICAN: 41 mL/min — AB (ref >60)
Glucose, Bld: 140 mg/dL — ABNORMAL HIGH (ref 65–99)
Potassium: 3.8 mmol/L (ref 3.5–5.1)
SODIUM: 137 mmol/L (ref 135–145)

## 2016-01-15 LAB — NM MYOCAR MULTI W/SPECT W/WALL MOTION / EF
CHL CUP RESTING HR STRESS: 74 {beats}/min
CHL CUP STRESS STAGE 2 GRADE: 0 %
CHL CUP STRESS STAGE 2 HR: 76 {beats}/min
CHL CUP STRESS STAGE 3 DBP: 51 mmHg
CHL CUP STRESS STAGE 3 SBP: 119 mmHg
CHL CUP STRESS STAGE 4 DBP: 55 mmHg
CHL CUP STRESS STAGE 4 SPEED: 0 mph
Exercise duration (min): 5 min
LV dias vol: 57 mL (ref 46–106)
LVSYSVOL: 41 mL
MPHR: 146 {beats}/min
Percent HR: 71 %
RATE: 0.3
SDS: 6
SRS: 9
SSS: 15
Stage 1 Grade: 0 %
Stage 1 HR: 76 {beats}/min
Stage 1 Speed: 0 mph
Stage 2 Speed: 0 mph
Stage 3 Grade: 0 %
Stage 3 HR: 101 {beats}/min
Stage 3 Speed: 0 mph
Stage 4 Grade: 0 %
Stage 4 HR: 97 {beats}/min
Stage 4 SBP: 123 mmHg
TID: 1.65

## 2016-01-15 LAB — ECHOCARDIOGRAM COMPLETE
HEIGHTINCHES: 62 in
WEIGHTICAEL: 2574.97 [oz_av]

## 2016-01-15 LAB — CBC
HEMATOCRIT: 31.2 % — AB (ref 36.0–46.0)
Hemoglobin: 10 g/dL — ABNORMAL LOW (ref 12.0–15.0)
MCH: 26 pg (ref 26.0–34.0)
MCHC: 32.1 g/dL (ref 30.0–36.0)
MCV: 81 fL (ref 78.0–100.0)
Platelets: 214 10*3/uL (ref 150–400)
RBC: 3.85 MIL/uL — ABNORMAL LOW (ref 3.87–5.11)
RDW: 14 % (ref 11.5–15.5)
WBC: 11.1 10*3/uL — AB (ref 4.0–10.5)

## 2016-01-15 LAB — HEMOGLOBIN A1C
HEMOGLOBIN A1C: 7.6 % — AB (ref 4.8–5.6)
MEAN PLASMA GLUCOSE: 171 mg/dL

## 2016-01-15 LAB — GLUCOSE, CAPILLARY
GLUCOSE-CAPILLARY: 139 mg/dL — AB (ref 65–99)
Glucose-Capillary: 220 mg/dL — ABNORMAL HIGH (ref 65–99)

## 2016-01-15 MED ORDER — METOPROLOL TARTRATE 25 MG PO TABS: 25.0000 mg | ORAL_TABLET | Freq: Two times a day (BID) | ORAL | 12 refills | Status: DC

## 2016-01-15 MED ORDER — LISINOPRIL 5 MG PO TABS: 5.0000 mg | ORAL_TABLET | Freq: Every day | ORAL | 12 refills | Status: DC

## 2016-01-15 MED ORDER — ISOSORBIDE MONONITRATE ER 60 MG PO TB24: 60.0000 mg | ORAL_TABLET | Freq: Every day | ORAL | 12 refills | Status: DC

## 2016-01-15 MED ORDER — PERFLUTREN LIPID MICROSPHERE
INTRAVENOUS | Status: AC
  Administered 2016-01-15: 14:00:00 3 mL via INTRAVENOUS
  Filled 2016-01-15: qty 10

## 2016-01-15 MED ORDER — PERFLUTREN LIPID MICROSPHERE
1.0000 mL | INTRAVENOUS | Status: AC | PRN
  Administered 2016-01-15: 3 mL via INTRAVENOUS
  Filled 2016-01-15: qty 10

## 2016-01-15 MED ORDER — NITROGLYCERIN 0.4 MG SL SUBL: 0.4000 mg | SUBLINGUAL_TABLET | SUBLINGUAL | 2 refills | Status: AC | PRN

## 2016-01-15 MED ORDER — EZETIMIBE 10 MG PO TABS: 10.0000 mg | ORAL_TABLET | Freq: Every day | ORAL | 12 refills | Status: DC

## 2016-01-15 MED ORDER — HYDROCORTISONE 1 % EX CREA: TOPICAL_CREAM | CUTANEOUS | 0 refills | Status: DC | PRN

## 2016-01-15 NOTE — Care Management Note (Signed)
Case Management Note  Patient Details  Name: [name] MRN: [id number] Date of Birth: 03/08/1940  Subjective/Objective:      No targets for PCI. Both grafts are open. The RCA is chronically occluded but fills from collaterals. Continue medical management of CAD, NCM will cont to follow for dc needs.               Action/Plan:   Expected Discharge Date:                  Expected Discharge Plan:  Home/Self Care  In-House Referral:     Discharge planning Services  CM Consult  Post Acute Care Choice:    Choice offered to:     DME Arranged:    DME Agency:     HH Arranged:    HH Agency:     Status of Service:  Completed, signed off  If discussed at [employer] of Stay Meetings, dates discussed:    Additional Comments:  Zenon Mayo, RN 01/15/2016, 9:10 AM

## 2016-01-15 NOTE — Discharge Summary (Signed)
Dictation #1 [id number]  [id number]     Discharge Summary    Patient ID: [name],  MRN: [id number], DOB/AGE: [birth year] 76 y.o.  Admit date: 01/13/2016 Discharge date: 01/15/2016  Primary Care Provider: Lynnell Jude Primary Cardiologist: New, Wants to establish with Dr. Meda Coffee   Discharge Diagnoses    Active Problems:   Unstable angina pectoris [facility])   Chest pain   PVD (peripheral vascular disease) ([facility])   Coronary artery disease involving coronary bypass graft of native heart with angina pectoris (H[facility])   Allergies Allergies  Allergen Reactions  . Other Swelling    Smoke--Head swelling  . Amoxicillin   . Codeine   . Duloxetine Nausea Only  . Statins Swelling  . Sulfa Antibiotics     Diagnostic Studies/Procedures    Left Heart Cath and Cors/Grafts Angiography 01/14/16  Ost RCA to Prox RCA lesion, 100 %stenosed.  Ost LM lesion, 70 %stenosed.  Ost Cx to Mid Cx lesion, 20 %stenosed.  Ost LAD to Prox LAD lesion, 99 %stenosed.  Prox LAD to Mid LAD lesion, 80 %stenosed.  Dist LAD lesion, 0 %stenosed.  Mid Cx lesion, 10 %stenosed.  SVG graft was visualized by angiography and is normal in caliber.  LIMA graft was visualized by angiography and is normal in caliber.  LM lesion, 70 %stenosed.  Ost 4th Mrg to 4th Mrg lesion, 50 %stenosed.  4th Mrg lesion, 50 %stenosed.   1. Severe triple vessel CAD with distal left main stenosis, s/p 2V CABG with 2/2 patent bypass grafts.  2. The distal left main has a 70% stenosis.  3. The LAD has diffuse proximal 90% stenosis. The mid LAD stent has 80% restenosis in the proximal segment of the stent. The mid and distal vessel fills from the patent LIMA graft. Competitive flow is seen in the mid LAD on antegrade injections.  4. The Circumflex has a patent mid stent. The first OM branch has diffuse moderate stenosis. The vein graft inserts into this OM branch. The entire Circumflex and OM system fills from antegrade  flow and from the vein graft.  5. The RCA is a small to moderate caliber non-dominant vessel with 100% proximal occlusion. The entire vessel fills from left to right collaterals.   Recommendations: No targets for PCI. Both grafts are open. The RCA is chronically occluded but fills from collaterals. Continue medical management of CAD.   _____________   History of Present Illness     [name] is a 76 year old female with a past medical history of CAD s/p CABG, DM, HTN, PAD s/p L fem pop in 2013, multiple myeloma, and ischemic cardiomyopathy. She presented to the ED on 01/13/15 with severe back pain which is historically her anginal equivalent.  [name] did not feel well all day on New Years Day, felt tired and very nauseous. She took multiple doses of Zofran which intermittently helped her nausea. She went to sleep and was awakened with severe left sided back pain with associated SOB and continued nausea.   She called EMS and was transport to [facility]. Her EKG on arrival showed NSR with evidence of old anterior and inferior infarcts. No acute ST changes when compared to her prior EKG's. She continued to feel nauseous but her back pain has been alleviated somewhat.   In May 2017 she presented to [facility] with abdominal pain and chest discomfort, she ruled in for NSTEMI and underwent heart catheterization. She had DES placed to the mid and distal LAD  as well as OM2. Also with mid circumflex lesion that was not intervened upon as it was felt to be chronic.   In October 2017 she presented to [facility] with a 2 day history of persistent nausea and vomiting. In the ED she developed left sided jaw pain and substernal chest pressure. Pharmacologic stress test was abnormal and she underwent LHC. She subsequently underwent 2 vessel CABG on 11/11/15 with LIMA-LAD and SVG-OM.   Her last Echo was in August 2017 at that time her EF was 45-50%, moderate MR, septal basilar akinesis.   She tells me that her back  pain is her anginal equivalent (although not charted as such when notes reviewed in [place]). She suffers from chronic pain from her multiple myeloma but says that her pain is usually in her legs.   Troponin is negative.    Hospital Course     Patient initially underwent stress test, but developed chest pain and ST segment changes so she was referred for immediate heart catheterization.   Full report above, 2/2 grafts patent. With severe native CAD. We will increase her Imdur to 46m daily. If she has recurrent angina amlodipine or Ranexa can be added to her anti-anginal regimen.   Her right radial site was stable without hematoma. She was seen today by Dr. KClaiborne Billingsand deemed suitable for discharge. She does not wish to take a statin or Zetia.  _____________  Discharge Vitals Blood pressure (!) 124/43, pulse 69, temperature 98 F (36.7 C), temperature source Oral, resp. rate 17, height _0  (1.575 m), weight 160 lb 15 oz (73 kg), SpO2 95 %.  Filed Weights   01/13/16 2300 01/15/16 0643  Weight: 166 lb 0.1 oz (75.3 kg) 160 lb 15 oz (73 kg)    Labs & Radiologic Studies     CBC  Recent Labs  01/14/16 1815 01/15/16 0352  WBC 10.6* 11.1*  HGB 10.3* 10.0*  HCT 31.9* 31.2*  MCV 81.4 81.0  PLT 220 2242  Basic Metabolic Panel  Recent Labs  01/14/16 0202 01/15/16 0352  NA 137 137  K 3.7 3.8  CL 104 106  CO2 24 23  GLUCOSE 127* 140*  BUN 14 14  CREATININE 1.25* 1.26*  CALCIUM 9.4 9.1   Cardiac Enzymes  Recent Labs  01/13/16 1514 01/13/16 1926 01/14/16 0202  TROPONINI <0.03 <0.03 <0.03   Hemoglobin A1C  Recent Labs  01/14/16 0202  HGBA1C 7.6*   Fasting Lipid Panel  Recent Labs  01/14/16 0202  CHOL 207*  HDL 31*  LDLCALC 133*  TRIG 214*  CHOLHDL 6.7    Dg Chest 2 View  Result Date: 01/13/2016 CLINICAL DATA:  Shortness of breath, nausea, and upper back pain beginning at 4 a.m. this morning. History of previous MI, multiple myeloma, diabetes.  EXAM: CHEST  2 VIEW COMPARISON:  Chest x-ray of November 29, 2015 FINDINGS: The the lungs are adequately inflated. There is no focal infiltrate. Previously demonstrated pleural effusions have resolved. The heart and pulmonary vascularity are normal. There is calcification in the wall of the aortic arch. The patient has undergone previous CABG. The bony thorax exhibits no acute abnormality. IMPRESSION: There is no acute cardiopulmonary abnormality.  Previous CABG. Thoracic aortic atherosclerosis. Electronically Signed   By: David  JMartiniqueM.D.   On: 01/13/2016 08:59   Ct Angio Chest/abd/pel For Dissection W And/or W/wo  Result Date: 01/13/2016 CLINICAL DATA:  76year old female with sudden onset severe centralized back pain with nausea and vomiting since 0400 hours.  Initial encounter. EXAM: CT ANGIOGRAPHY CHEST, ABDOMEN AND PELVIS TECHNIQUE: Multidetector CT imaging through the chest, abdomen and pelvis was performed using the standard protocol during bolus administration of intravenous contrast. Multiplanar reconstructed images and MIPs were obtained and reviewed to evaluate the vascular anatomy. CONTRAST:  100 mL Isovue 370 COMPARISON:  Chest radiographs 751 hours today and earlier. Cervical spine CT 07/26/2015. FINDINGS: CTA CHEST FINDINGS Cardiovascular: Extensive calcified aortic and coronary artery atherosclerosis. Sequelae of CABG. Mild cardiomegaly. No pericardial effusion. No thoracic aortic aneurysm or dissection. Proximal great vessels are patent. There is also adequate contrast in the central pulmonary arteries. No pulmonary artery filling defect identified. Mediastinum/Nodes: No mediastinal or hilar lymphadenopathy. Lungs/Pleura: Major airways are patent. There is a small layering left pleural effusion. There is mild associated left lung compressive atelectasis. There is mild superimposed bilateral dependent pulmonary atelectasis. Small subpleural nodule along the right minor fissure on series 7, image  70 has a benign / postinflammatory appearance. No other abnormal pulmonary opacity . Musculoskeletal: Prior median sternotomy. No acute osseous abnormality identified. Review of the MIP images confirms the above findings. CTA ABDOMEN AND PELVIS FINDINGS VASCULAR Extensive Aortoiliac calcified atherosclerosis. No abdominal aortic aneurysm or dissection. Major aortic branches including the celiac, SMA, bilateral renal arteries, and IMA are patent. Both external iliac arteries are patent. There is poor flow or occlusion of the distal most left internal iliac artery branches. The right internal iliac artery is appear patent. Calcified atherosclerosis continues into the bilateral femoral arteries. There is a patent bypass graft of the left common femoral artery extending distally. There is an occluded left SFA (Series 6, image 285). The visible left PFA is patent. Stent there is a patent stent of the right SFA (series 6, image 299). The visible right PFA is patent. Review of the MIP images confirms the above findings. NON-VASCULAR Hepatobiliary: No abdominal free air or free fluid identified. Borderline to mild hepatic steatosis. Otherwise negative liver and gallbladder. No biliary ductal enlargement. Pancreas: Negative. Spleen: Negative. Adrenals/Urinary Tract: Minimally nodular left adrenal gland but overall preserved left adreniform shape. Negative right adrenal gland. Bilateral renal enhancement appears symmetric. No hydronephrosis or hydroureter. No definite nephrolithiasis. Small volume of gas in the non dependent urinary bladder (Series 6, image 263). The bladder otherwise appears unremarkable. Stomach/Bowel: Gas distended rectum appears normal. There is moderate to severe diverticulosis throughout the sigmoid colon, and the sigmoid wall appears indistinct. There does appear to be subtle mesenteric stranding about the mid sigmoid colon as seen on series 9, image 70. No extraluminal gas. Diverticulosis continues in  the left colon with no definite active inflammation. Mild diverticulosis at the hepatic flexure with otherwise negative transverse colon and ascending colon. Negative cecum and appendix. Negative terminal ileum. No dilated small bowel. Sequelae of gastric banding. The proximal portion of the stomach is nondilated. There is gas and fluid in the more distal stomach which appears within normal limits. Incidental diverticulum of the second portion of the duodenum. The duodenum otherwise appears normal. Lymphatic: Maximal lymph nodes in the retroperitoneum (such as on series 6, image 163). No lymphadenopathy elsewhere in the abdomen or pelvis. Reproductive: Surgically absent uterus. The right adnexa appears normal on series 6, image 247. The left is diminutive or absent. Other: Small volume of pelvic free fluid (series 6, image 258). 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 Vacuum disc at the adjacent lower lumbar segment. No acute osseous abnormality identified. Review of the MIP images confirms the above findings. IMPRESSION: 1. Extensive  aortic and other major arterial structure atherosclerosis. No aortic aneurysm or dissection. Prior CABG. No central or hilar pulmonary artery embolus. Partially visible left femoral artery bypass graft is patent. Partially visible right SFA stent is patent. 2. Nonspecific small layering left pleural effusion and small volume pelvic free fluid. 3. With regard the pelvic fluid there is severe diverticulosis of the sigmoid colon and mild acute diverticulitis is difficult to exclude. No other bowel inflammation suspected. The appendix is normal. Prior gastric banding with no adverse features. 4. With regard to the small left pleural effusion there is only mild associated pulmonary atelectasis. 5. Small volume of gas in the urinary bladder, suspicious for acute UTI unless explained by recent catheterization. Electronically Signed   By: Genevie Ann M.D.   On: 01/13/2016  13:14    Disposition   Pt is being discharged home today in good condition.  Follow-up Plans & Appointments    Follow-up Information    Ena Dawley, MD Follow up on 02/23/2016.   Specialty:  Cardiology Why:  at 10:30am for hospital follow up.  Contact information: [address] [place] [address] [phone]          Discharge Instructions    Diet - low sodium heart healthy    Complete by:  As directed    Discharge instructions    Complete by:  As directed    Radial Site Care Refer to this sheet in the next few weeks. These instructions provide you with information on caring for yourself after your procedure. Your caregiver may also give you more specific instructions. Your treatment has been planned according to current medical practices, but problems sometimes occur. Call your caregiver if you have any problems or questions after your procedure. HOME CARE INSTRUCTIONS You may shower the day after the procedure.Remove the bandage (dressing) and gently wash the site with plain soap and water.Gently pat the site dry.  Do not apply powder or lotion to the site.  Do not submerge the affected site in water for 3 to 5 days.  Inspect the site at least twice daily.  Do not flex or bend the affected arm for 24 hours.  No lifting over 5 pounds (2.3 kg) for 5 days after your procedure.  Do not drive home if you are discharged the same day of the procedure. Have someone else drive you.  You may drive 24 hours after the procedure unless otherwise instructed by your caregiver.  What to expect: Any bruising will usually fade within 1 to 2 weeks.  Blood that collects in the tissue (hematoma) may be painful to the touch. It should usually decrease in size and tenderness within 1 to 2 weeks.  SEEK IMMEDIATE MEDICAL CARE IF: You have unusual pain at the radial site.  You have redness, warmth, swelling, or pain at the radial site.  You have drainage (other than a  small amount of blood on the dressing).  You have chills.  You have a fever or persistent symptoms for more than 72 hours.  You have a fever and your symptoms suddenly get worse.  Your arm becomes pale, cool, tingly, or numb.  You have heavy bleeding from the site. Hold pressure on the site.     PLEASE RESUME METFORMIN ON 01/16/16   Increase activity slowly    Complete by:  As directed       Discharge Medications   Current Discharge Medication List    START taking these medications  Details  hydrocortisone cream 1 % Apply topically as needed for itching. Qty: 30 g, Refills: 0    nitroGLYCERIN (NITROSTAT) 0.4 MG SL tablet Place 1 tablet (0.4 mg total) under the tongue every 5 (five) minutes as needed for chest pain. Qty: 25 tablet, Refills: 2      CONTINUE these medications which have CHANGED   Details  isosorbide mononitrate (IMDUR) 60 MG 24 hr tablet Take 1 tablet (60 mg total) by mouth daily. Qty: 30 tablet, Refills: 12    lisinopril (PRINIVIL,ZESTRIL) 5 MG tablet Take 1 tablet (5 mg total) by mouth daily. Qty: 30 tablet, Refills: 12    metoprolol tartrate (LOPRESSOR) 25 MG tablet Take 1 tablet (25 mg total) by mouth 2 (two) times daily. Qty: 60 tablet, Refills: 12      CONTINUE these medications which have NOT CHANGED   Details  aspirin 81 MG tablet Take 81 mg by mouth daily.    Cholecalciferol (VITAMIN D3) 50000 units CAPS Take 1 capsule by mouth 2 (two) times a week.    clopidogrel (PLAVIX) 75 MG tablet Take 75 mg by mouth daily.    diclofenac sodium (VOLTAREN) 1 % GEL Apply 2 g topically daily as needed for pain.    fentaNYL (DURAGESIC - DOSED MCG/HR) 25 MCG/HR patch Place 25 mcg onto the skin every 3 (three) days.    gabapentin (NEURONTIN) 300 MG capsule Take 300-600 mg by mouth See admin instructions. Pt takes 633m in am, 3024mat bedtime    HYDROmorphone (DILAUDID) 2 MG tablet Take 2 mg by mouth every 4 (four) hours as needed for severe pain.     metFORMIN (GLUCOPHAGE) 500 MG tablet Take 1,000 mg by mouth 2 (two) times daily with a meal.     ondansetron (ZOFRAN-ODT) 4 MG disintegrating tablet Take 4 mg by mouth every 8 (eight) hours as needed for nausea or vomiting.    senna-docusate (SENOKOT-S) 8.6-50 MG tablet Take 2 tablets by mouth 2 (two) times daily as needed for constipation.      STOP taking these medications     ezetimibe (ZETIA) 10 MG tablet            Outstanding Labs/Studies    Duration of Discharge Encounter   Greater than 30 minutes including physician time.  Signed, ErArbutus LeasP 01/15/2016, 11:59 AM

## 2016-01-15 NOTE — Progress Notes (Signed)
Patient Name: [name] Date of Encounter: 01/15/2016  Primary Cardiologist: New, Wants to establish with Dr. Rhona Raider Problem List     Active Problems:   Unstable angina [facility])   Chest pain   PVD (peripheral vascular disease) ([place])   Coronary artery disease involving coronary bypass graft of native heart with angina pectoris ([place])     Subjective   Feels great this morning, no back pain. Denies chest pain.   Inpatient Medications    Scheduled Meds: . aspirin EC  81 mg Oral Daily  . clopidogrel  75 mg Oral Daily  . ezetimibe  10 mg Oral Daily  . feeding supplement  1 Container Oral TID BM  . fentaNYL  25 mcg Transdermal Q72H  . gabapentin  300 mg Oral QHS  . gabapentin  600 mg Oral q morning - 10a  . insulin aspart  0-9 Units Subcutaneous TID WC  . isosorbide mononitrate  30 mg Oral Daily  . lisinopril  5 mg Oral Daily  . metoprolol  25 mg Oral BID  . sodium chloride flush  3 mL Intravenous Q12H   Continuous Infusions: . DOPamine     PRN Meds: sodium chloride, acetaminophen, ALPRAZolam, hydrocortisone cream, HYDROmorphone, nitroGLYCERIN, ondansetron (ZOFRAN) IV, promethazine, sodium chloride flush   Vital Signs    Vitals:   01/14/16 1800 01/14/16 2000 01/14/16 2037 01/15/16 0643  BP: (!) 148/54  (!) 112/42 (!) 149/41  Pulse:   78 69  Resp: _0 Temp:   98.2 F (36.8 C) 98.3 F (36.8 C)  TempSrc:   Oral Oral  SpO2:   94% 95%  Weight:    160 lb 15 oz (73 kg)  Height:        Intake/Output Summary (Last 24 hours) at 01/15/16 0809 Last data filed at 01/15/16 0200  Gross per 24 hour  Intake              490 ml  Output              650 ml  Net             -160 ml   Filed Weights   01/13/16 2300 01/15/16 0643  Weight: 166 lb 0.1 oz (75.3 kg) 160 lb 15 oz (73 kg)    Physical Exam    GEN: Well nourished, well developed, in no acute distress.  HEENT: Grossly normal.  Neck: Supple, no JVD, carotid bruits, or masses. Cardiac: RRR,  no murmurs, rubs, or gallops. No clubbing, cyanosis, edema.  Radials/DP/PT 2+ and equal bilaterally.  Respiratory:  Respirations regular and unlabored, clear to auscultation bilaterally. GI: Soft, nontender, nondistended, BS + x 4. MS: no deformity or atrophy. Skin: warm and dry, no rash. Neuro:  Strength and sensation are intact. Psych: AAOx3.  Normal affect.  Labs    CBC  Recent Labs  01/14/16 1815 01/15/16 0352  WBC 10.6* 11.1*  HGB 10.3* 10.0*  HCT 31.9* 31.2*  MCV 81.4 81.0  PLT 220 384   Basic Metabolic Panel  Recent Labs  01/14/16 0202 01/15/16 0352  NA 137 137  K 3.7 3.8  CL 104 106  CO2 24 23  GLUCOSE 127* 140*  BUN 14 14  CREATININE 1.25* 1.26*  CALCIUM 9.4 9.1   Cardiac Enzymes  Recent Labs  01/13/16 1514 01/13/16 1926 01/14/16 0202  TROPONINI <0.03 <0.03 <0.03   Hemoglobin A1C  Recent Labs  01/14/16 0202  HGBA1C 7.6*  Fasting Lipid Panel  Recent Labs  01/14/16 0202  CHOL 207*  HDL 31*  LDLCALC 133*  TRIG 214*  CHOLHDL 6.7     Telemetry    NSR - Personally Reviewed  ECG     NSR- Personally Reviewed  Radiology    Dg Chest 2 View  Result Date: 01/13/2016 CLINICAL DATA:  Shortness of breath, nausea, and upper back pain beginning at 4 a.m. this morning. History of previous MI, multiple myeloma, diabetes. EXAM: CHEST  2 VIEW COMPARISON:  Chest x-ray of November 29, 2015 FINDINGS: The the lungs are adequately inflated. There is no focal infiltrate. Previously demonstrated pleural effusions have resolved. The heart and pulmonary vascularity are normal. There is calcification in the wall of the aortic arch. The patient has undergone previous CABG. The bony thorax exhibits no acute abnormality. IMPRESSION: There is no acute cardiopulmonary abnormality.  Previous CABG. Thoracic aortic atherosclerosis. Electronically Signed   By: David  Martinique M.D.   On: 01/13/2016 08:59   Ct Angio Chest/abd/pel For Dissection W And/or W/wo  Result  Date: 01/13/2016 CLINICAL DATA:  76 year old female with sudden onset severe centralized back pain with nausea and vomiting since 0400 hours. Initial encounter. EXAM: CT ANGIOGRAPHY CHEST, ABDOMEN AND PELVIS TECHNIQUE: Multidetector CT imaging through the chest, abdomen and pelvis was performed using the standard protocol during bolus administration of intravenous contrast. Multiplanar reconstructed images and MIPs were obtained and reviewed to evaluate the vascular anatomy. CONTRAST:  100 mL Isovue 370 COMPARISON:  Chest radiographs 751 hours today and earlier. Cervical spine CT 07/26/2015. FINDINGS: CTA CHEST FINDINGS Cardiovascular: Extensive calcified aortic and coronary artery atherosclerosis. Sequelae of CABG. Mild cardiomegaly. No pericardial effusion. No thoracic aortic aneurysm or dissection. Proximal great vessels are patent. There is also adequate contrast in the central pulmonary arteries. No pulmonary artery filling defect identified. Mediastinum/Nodes: No mediastinal or hilar lymphadenopathy. Lungs/Pleura: Major airways are patent. There is a small layering left pleural effusion. There is mild associated left lung compressive atelectasis. There is mild superimposed bilateral dependent pulmonary atelectasis. Small subpleural nodule along the right minor fissure on series 7, image 70 has a benign / postinflammatory appearance. No other abnormal pulmonary opacity . Musculoskeletal: Prior median sternotomy. No acute osseous abnormality identified. Review of the MIP images confirms the above findings. CTA ABDOMEN AND PELVIS FINDINGS VASCULAR Extensive Aortoiliac calcified atherosclerosis. No abdominal aortic aneurysm or dissection. Major aortic branches including the celiac, SMA, bilateral renal arteries, and IMA are patent. Both external iliac arteries are patent. There is poor flow or occlusion of the distal most left internal iliac artery branches. The right internal iliac artery is appear patent.  Calcified atherosclerosis continues into the bilateral femoral arteries. There is a patent bypass graft of the left common femoral artery extending distally. There is an occluded left SFA (Series 6, image 285). The visible left PFA is patent. Stent there is a patent stent of the right SFA (series 6, image 299). The visible right PFA is patent. Review of the MIP images confirms the above findings. NON-VASCULAR Hepatobiliary: No abdominal free air or free fluid identified. Borderline to mild hepatic steatosis. Otherwise negative liver and gallbladder. No biliary ductal enlargement. Pancreas: Negative. Spleen: Negative. Adrenals/Urinary Tract: Minimally nodular left adrenal gland but overall preserved left adreniform shape. Negative right adrenal gland. Bilateral renal enhancement appears symmetric. No hydronephrosis or hydroureter. No definite nephrolithiasis. Small volume of gas in the non dependent urinary bladder (Series 6, image 263). The bladder otherwise appears unremarkable. Stomach/Bowel: Gas distended rectum  appears normal. There is moderate to severe diverticulosis throughout the sigmoid colon, and the sigmoid wall appears indistinct. There does appear to be subtle mesenteric stranding about the mid sigmoid colon as seen on series 9, image 70. No extraluminal gas. Diverticulosis continues in the left colon with no definite active inflammation. Mild diverticulosis at the hepatic flexure with otherwise negative transverse colon and ascending colon. Negative cecum and appendix. Negative terminal ileum. No dilated small bowel. Sequelae of gastric banding. The proximal portion of the stomach is nondilated. There is gas and fluid in the more distal stomach which appears within normal limits. Incidental diverticulum of the second portion of the duodenum. The duodenum otherwise appears normal. Lymphatic: Maximal lymph nodes in the retroperitoneum (such as on series 6, image 163). No lymphadenopathy elsewhere in the  abdomen or pelvis. Reproductive: Surgically absent uterus. The right adnexa appears normal on series 6, image 247. The left is diminutive or absent. Other: Small volume of pelvic free fluid (series 6, image 258). 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 Vacuum disc at the adjacent lower lumbar segment. No acute osseous abnormality identified. Review of the MIP images confirms the above findings. IMPRESSION: 1. Extensive aortic and other major arterial structure atherosclerosis. No aortic aneurysm or dissection. Prior CABG. No central or hilar pulmonary artery embolus. Partially visible left femoral artery bypass graft is patent. Partially visible right SFA stent is patent. 2. Nonspecific small layering left pleural effusion and small volume pelvic free fluid. 3. With regard the pelvic fluid there is severe diverticulosis of the sigmoid colon and mild acute diverticulitis is difficult to exclude. No other bowel inflammation suspected. The appendix is normal. Prior gastric banding with no adverse features. 4. With regard to the small left pleural effusion there is only mild associated pulmonary atelectasis. 5. Small volume of gas in the urinary bladder, suspicious for acute UTI unless explained by recent catheterization. Electronically Signed   By: Odessa Fleming M.D.   On: 01/13/2016 13:14    Cardiac Studies   Left Heart Cath and Cors/Grafts Angiography 01/14/15  Ost RCA to Prox RCA lesion, 100 %stenosed.  Ost LM lesion, 70 %stenosed.  Ost Cx to Mid Cx lesion, 20 %stenosed.  Ost LAD to Prox LAD lesion, 99 %stenosed.  Prox LAD to Mid LAD lesion, 80 %stenosed.  Dist LAD lesion, 0 %stenosed.  Mid Cx lesion, 10 %stenosed.  SVG graft was visualized by angiography and is normal in caliber.  LIMA graft was visualized by angiography and is normal in caliber.  LM lesion, 70 %stenosed.  Ost 4th Mrg to 4th Mrg lesion, 50 %stenosed.  4th Mrg lesion, 50 %stenosed.   1. Severe triple  vessel CAD with distal left main stenosis, s/p 2V CABG with 2/2 patent bypass grafts.  2. The distal left main has a 70% stenosis.  3. The LAD has diffuse proximal 90% stenosis. The mid LAD stent has 80% restenosis in the proximal segment of the stent. The mid and distal vessel fills from the patent LIMA graft. Competitive flow is seen in the mid LAD on antegrade injections.  4. The Circumflex has a patent mid stent. The first OM branch has diffuse moderate stenosis. The vein graft inserts into this OM branch. The entire Circumflex and OM system fills from antegrade flow and from the vein graft.  5. The RCA is a small to moderate caliber non-dominant vessel with 100% proximal occlusion. The entire vessel fills from left to right collaterals.   Recommendations: No targets for  PCI. Both grafts are open. The RCA is chronically occluded but fills from collaterals. Continue medical management of CAD.    Patient Profile     76 year old female with a past medical history of CAD s/p CABG, DM, HTN, PAD s/p L fem pop in 2013, multiple myeloma, and ischemic cardiomyopathy. She presented to the ED on 01/13/15 with severe back pain which is historically her anginal equivalent.  Assessment & Plan    1. CAD: See cath report above. 2/2 grafts open. Will continue medical management of her CAD.   Will remain on ASA and Plavix. Continue metoprolol.   2. S/p CABG  3. DM: SSI.   4. History of multiple myeloma: On home medications. She sees Dr. Laverta Baltimore at [facility] for this.   5. Sternal wound: Patient expresses that she is being treated for a wound infection at her CABG site. WOC has seen.  6. Ischemic cardiomyopathy: Last EF 45-50%, continue beta blocker and ACE-I.   DC home today.   Signed, Arbutus Leas, NP  01/15/2016, 8:09 AM    Patient seen and examined. Agree with assessment and plan.No recurrent chest pain. Reviewed angiographic findings with pt.  Will increase imdur to 60 mg. Plan for dc today.  If  recurrent symptoms as outpatient can add amlodipine or ranexa.  Consider [facility] inhibition as outpatient.    Troy Sine, MD, [facility] 01/15/2016 11:01 AM

## 2016-01-15 NOTE — Progress Notes (Signed)
  Echocardiogram 2D Echocardiogram has been performed.  [name] M 01/15/2016, 2:05 PM

## 2016-01-15 NOTE — Progress Notes (Signed)
Incisional wound at base of healed sternal incision.  Dressing changed and wound cleaned with NS.  0.5 cm round, 2 cm deep, brown/tan exudate.  Repacked with aquacel and covered with Telfa non-adherent dressing.  Patient will continue wound care regimen as before hospitalization.

## 2016-01-20 ENCOUNTER — Ambulatory Visit

## 2016-01-20 ENCOUNTER — Encounter: Payer: Self-pay | Admitting: *Deleted

## 2016-01-20 DIAGNOSIS — Z951 Presence of aortocoronary bypass graft: Secondary | ICD-10-CM

## 2016-01-22 ENCOUNTER — Ambulatory Visit

## 2016-01-27 ENCOUNTER — Encounter: Payer: Medicare Other | Attending: Internal Medicine | Admitting: Internal Medicine

## 2016-01-27 ENCOUNTER — Ambulatory Visit

## 2016-01-27 DIAGNOSIS — Z7984 Long term (current) use of oral hypoglycemic drugs: Secondary | ICD-10-CM | POA: Diagnosis not present

## 2016-01-27 DIAGNOSIS — Y839 Surgical procedure, unspecified as the cause of abnormal reaction of the patient, or of later complication, without mention of misadventure at the time of the procedure: Secondary | ICD-10-CM | POA: Diagnosis not present

## 2016-01-27 DIAGNOSIS — L98498 Non-pressure chronic ulcer of skin of other sites with other specified severity: Secondary | ICD-10-CM | POA: Diagnosis not present

## 2016-01-27 DIAGNOSIS — C9000 Multiple myeloma not having achieved remission: Secondary | ICD-10-CM | POA: Insufficient documentation

## 2016-01-27 DIAGNOSIS — Z7982 Long term (current) use of aspirin: Secondary | ICD-10-CM | POA: Insufficient documentation

## 2016-01-27 DIAGNOSIS — T8131XD Disruption of external operation (surgical) wound, not elsewhere classified, subsequent encounter: Secondary | ICD-10-CM | POA: Diagnosis not present

## 2016-01-27 DIAGNOSIS — L03313 Cellulitis of chest wall: Secondary | ICD-10-CM | POA: Insufficient documentation

## 2016-01-27 DIAGNOSIS — E1165 Type 2 diabetes mellitus with hyperglycemia: Secondary | ICD-10-CM | POA: Diagnosis not present

## 2016-01-27 DIAGNOSIS — Z7902 Long term (current) use of antithrombotics/antiplatelets: Secondary | ICD-10-CM | POA: Insufficient documentation

## 2016-01-29 ENCOUNTER — Ambulatory Visit

## 2016-02-02 NOTE — Progress Notes (Signed)
[name] ([id number]) Visit Report for 01/27/2016 Chief Complaint Document Details Patient Name: [name], [name] Date of Service: 01/27/2016 11:00 AM Medical Record Patient Account Number: [id number] Number: Treating RN: Ahmed Prima 21-Jun-1940 (75 y.o. Other Clinician: Date of Birth/Sex: Female) Treating [name] Primary Care Physician/Extender: Orvis Brill Physician: Referring Physician: [name] in Treatment: 4 Information Obtained from: Patient Chief Complaint 12/24/15 patient is here for review of to nonhealing surgical wounds in a CABG scar Electronic Signature(s) Signed: 01/28/2016 7:43:54 AM By: [name] MD Entered By: [name] on 01/27/2016 12:26:21 [name], [name] ([id number]) -------------------------------------------------------------------------------- HPI Details Patient Name: Oregel, Sloane A. Date of Service: 01/27/2016 11:00 AM Medical Record Patient Account Number: [id number] Number: Treating RN: Ahmed Prima [birth year] (75 y.o. Other Clinician: Date of Birth/Sex: Female) Treating [name] Primary Care Physician/Extender: Orvis Brill Physician: Referring Physician: [name] in Treatment: 4 History of Present Illness HPI Description: 12/24/15; [name] is a 76 year old woman who is the mother of one of our other clinic patient's [name] He comes [place] today for review a nonhealing surgical CABG wound. The patient underwent CABG o2 on October 31 17 at [facility]. She apparently had had a myocardial infarction in October but briefly presented with shortness of breath. She underwent surgery which I understand was uneventful. According to the patient her surgical incision never really healed in 2 spots. One is at the inferior recess question drain site, the other more superiorly about an inch from the superior surface of this it incision. She has been followed by her  surgeon at [facility] who felt that both areas which he'll according to the patient. She was put on Augmentin 2 weeks ago and she is finished a course of this culture at that time showed Escherichia coli which should've been sensitive. The patient also relates that she has a history of allergy to ampicillin. Besides this the patient has a history of type 2 diabetes on metformin, multiple myeloma. She had a chest x-ray in mid October the did not show any abnormalities. I cannot see that she had any specific views of the sternum however. We have her referral note from her primary physician Dr. Bernie Covey. She was concerned about erythema around the wound and as mentioned the culture here showed Escherichia coli which should have been Augmentin sensitive but was resistant to ampicillin, Keflex third generation cephalosporins quinolones and sulfonamides. [ESBL). The patient does not complain of fever or chills excessive pain. She does say that the lower surgical wound has been draining what looks to her like pus. She has been using triple antibiotic cream. 12/31/15 superior wound actually looks somewhat better. Inferiorly still a wound with some depth greater than 2 cm. Using Aquacel Ag. Culture I did of the drainage from the inferior wound and this surgical incision last week was negative 01/07/16; superior wound still requires debridement of surface eschar and nonviable subcutaneous tissue. Inferior wound measured at 1.8 cm. What I can see of the tissue here does not look very healthy. Original culture that I did on her presentation was negative however 01/27/16; the superior wound is closed she still has a deep probing wound of roughly 2 cm inferiorly. There is no overt infection no surrounding erythema. The patient tells me that 2 weeks ago she was developing chest pain and underwent a nuclear stress test. This was apparently negative however she developed chest pain shortly thereafter and had to  go have a cardiac  catheterization that did not show blockages. Since then she is not having chest pain but has unrelenting nausea making it difficult for her to eat. We reviewed her medications and she is on aspirin and Plavix however she apparently has a stent in place as well. Electronic Signature(s) Signed: 01/28/2016 7:43:54 AM By: [name] MD [name], [name] [name] Muse ([id number]) Entered By: [name] on 01/27/2016 12:28:05 [name], [name] ([id number]) -------------------------------------------------------------------------------- Physical Exam Details Patient Name: [name], [name] Date of Service: 01/27/2016 11:00 AM Medical Record Patient Account Number: [id number] Number: Treating RN: Ahmed Prima 05/06/1940 (75 y.o. Other Clinician: Date of Birth/Sex: Female) Treating Piccola Arico Primary Care Physician/Extender: Orvis Brill Physician: Referring Physician: [name] in Treatment: 4 Constitutional Patient is hypertensive.. Pulse regular and within target range for patient.Marland [name] Respirations regular, non-labored and within target range.. Temperature is normal and within the target range for the patient.. Patient's appearance is neat and clean. Appears in no acute distress. Well nourished and well developed.. Eyes Conjunctivae clear. No discharge.Marland [name] Respiratory Respiratory effort is easy and symmetric bilaterally. Rate is normal at rest and on room air.. Bilateral breath sounds are clear and equal in all lobes with no wheezes, rales or rhonchi.. Cardiovascular No S4, no murmurs or rubs. Gastrointestinal (GI) Abdomen is soft and non-distended without masses or tenderness. Bowel sounds active in all quadrants.. No liver or spleen enlargement or tenderness.Marland [name] Psychiatric No evidence of depression, anxiety, or agitation. Calm, cooperative, and communicative. Appropriate interactions and affect.. Notes #1 the superior wound in this lady's original  sternotomy incision is healed however inferiorly she still has a small open area with 2 cm of depth of my measurement today. There is no infection there is obvious no tenderness no palpable bone. She is using silver alginate Electronic Signature(s) Signed: 01/28/2016 7:43:54 AM By: [name] MD Entered By: [name] on 01/27/2016 12:30:15 [name], [name] ([id number]) -------------------------------------------------------------------------------- Physician Orders Details Patient Name: Stewart, Bianca A. Date of Service: 01/27/2016 11:00 AM Medical Record Patient Account Number: [id number] Number: Treating RN: Ahmed Prima 06/22/1940 (75 y.o. Other Clinician: Date of Birth/Sex: Female) Treating Lamarcus Spira Primary Care Physician/Extender: Niel Hummer, LAURA Physician: Referring Physician: [name] in Treatment: 4 Verbal / Phone Orders: Yes ClinicianCarolyne Fiscal, Debi Read Back and Verified: Yes Diagnosis Coding Wound Cleansing Wound #2 Distal,Midline Chest o Clean wound with Normal Saline. o May Shower, gently pat wound dry prior to applying new dressing. Anesthetic Wound #2 Distal,Midline Chest o Topical Lidocaine 4% cream applied to wound bed prior to debridement Primary Wound Dressing Wound #2 Distal,Midline Chest o Aquacel Ag - rope Secondary Dressing Wound #2 Distal,Midline Chest o Dry Gauze o Other - telfa island Dressing Change Frequency Wound #2 Distal,Midline Chest o Change dressing every day. Follow-up Appointments Wound #2 Distal,Midline Chest o Return Appointment in 1 week. Additional Orders / Instructions Wound #2 Distal,Midline Chest o Increase protein intake. Electronic Signature(s) JAKYAH, BRADBY ([id number]) Signed: 01/28/2016 7:43:54 AM By: [name] MD Signed: 02/02/2016 8:01:02 AM By: Alric Quan Entered By: Alric Quan on 01/27/2016 11:29:13 Cedrone, [name]  ([id number]) -------------------------------------------------------------------------------- Problem List Details Patient Name: Nusbaum, Flora A. Date of Service: 01/27/2016 11:00 AM Medical Record Patient Account Number: [id number] Number: Treating RN: Ahmed Prima January 21, 1940 (75 y.o. 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 ([id number]) -------------------------------------------------------------------------------- Progress Note Details Patient Name: [name], [name] Date of Service: 01/27/2016 11:00 AM Medical Record Patient Account Number: [id number] Number: Treating RN: Ahmed Prima 1940-06-05 (75 y.o. Other Clinician: Date of Birth/Sex: Female) Treating ,  Primary Care Physician/Extender: Orvis Brill Physician: Referring Physician: [name] in Treatment: 4 Subjective Chief Complaint Information obtained from Patient 12/24/15 patient is here for review of to nonhealing surgical wounds in a CABG scar History of Present Illness (HPI) 12/24/15; [name] is a 76 year old woman who is the mother of one of our other clinic patient's [name] Jiron. He comes [place] today for review a nonhealing surgical CABG wound. The patient underwent CABG o2 on October 31 17 at [facility]. She apparently had had a myocardial infarction in October but briefly presented with shortness of breath. She underwent surgery which I understand was uneventful. According to the patient her surgical incision never really healed in 2 spots. One is at the inferior recess question drain site, the other more superiorly about an inch from the superior surface of this it incision. She has been followed by her surgeon at [facility] who felt that both areas which he'll according to the patient. She was put on Augmentin 2 weeks ago and she is finished a course of this culture at that time showed Escherichia coli which should've been sensitive. The patient also relates that she has a history of allergy to ampicillin. Besides this the patient has a history of type 2 diabetes on metformin, multiple myeloma. She had a chest x-ray in mid October the did not show any abnormalities. I cannot see that she had any specific views of the sternum however. We have her referral note from her primary physician Dr. Bernie Covey. She was concerned about erythema around the wound and as mentioned the culture here showed Escherichia coli which should have been Augmentin sensitive but was resistant to ampicillin, Keflex third generation cephalosporins quinolones and sulfonamides. [ESBL). The patient does not complain of fever or chills excessive pain. She does say that the lower surgical wound has been draining what looks to her like pus. She has been using triple antibiotic cream. 12/31/15 superior wound actually looks somewhat better. Inferiorly still a wound with some depth greater than 2 cm. Using Aquacel Ag. Culture I did of the drainage from the inferior wound and this surgical incision last week was negative 01/07/16; superior wound still requires debridement of surface eschar and nonviable subcutaneous tissue. Inferior wound measured at 1.8 cm. What I can see of the tissue here does not look very healthy.  Original culture that I did on her presentation was negative however 01/27/16; the superior wound is closed she still has a deep probing wound of roughly 2 cm inferiorly. There is no overt infection no surrounding erythema. The patient tells me that 2 weeks ago she was developing chest pain and underwent a nuclear stress test. This was apparently negative however she developed chest pain shortly thereafter and had to go have a cardiac catheterization that did not show blockages. Since then [name], [name] ([id number]) she is not having chest pain but has unrelenting nausea making it difficult for her to eat.  We reviewed her medications and she is on aspirin and Plavix however she apparently has a stent in place as well. Objective Constitutional Patient is hypertensive.. Pulse regular and within target range for patient.Marland [name] Respirations regular, non-labored and within target range.. Temperature is normal and within the target range for the patient.. Patient's appearance is neat and clean. Appears in no acute distress. Well nourished and well developed.. Vitals Time Taken: 11:14 AM, Height: 62 in, Weight: 161 lbs, BMI: 29.4, Temperature: 97.6 F, Pulse: 60 bpm, Respiratory Rate: 18 breaths/min, Blood Pressure: 153/56 mmHg. Eyes Conjunctivae clear. No discharge.Marland [name] Respiratory Respiratory effort is easy and symmetric bilaterally. Rate is normal at rest and on room air.. Bilateral breath sounds are clear and equal in all lobes with no wheezes, rales or rhonchi.. Cardiovascular No S4, no murmurs or rubs. Gastrointestinal (GI) Abdomen is soft and non-distended without masses or tenderness. Bowel sounds active in all quadrants.. No liver or spleen enlargement or tenderness.Marland [name] Psychiatric No evidence of depression, anxiety, or agitation. Calm, cooperative, and communicative. Appropriate interactions and affect.. General Notes: #1 the superior wound in this lady's original sternotomy incision is healed  however inferiorly she still has a small open area with 2 cm of depth of my measurement today. There is no infection there is obvious no tenderness no palpable bone. She is using silver alginate Integumentary (Hair, Skin) Wound #1 status is Open. Original cause of wound was Surgical Injury. The wound is located on the Proximal,Midline Chest. The wound measures 0cm length x 0cm width x 0cm depth; 0cm^2 area and 0cm^3 volume. The wound is limited to skin breakdown. There is no tunneling or undermining noted. There is a none present amount of drainage noted. The wound margin is flat and intact. There is no granulation within the wound bed. There is no necrotic tissue within the wound bed. The periwound skin appearance exhibited: Localized Edema, Erythema. The periwound skin appearance did not exhibit: Callus, Crepitus, Excoriation, Fluctuance, Friable, Induration, Rash, Scarring, Dry/Scaly, Maceration, Moist, Atrophie Musco, [name] ([id number]) Blanche, Cyanosis, Ecchymosis, Hemosiderin Staining, Mottled, Pallor, Rubor. The surrounding wound skin color is noted with erythema which is circumferential. Periwound temperature was noted as No Abnormality. Wound #2 status is Open. Original cause of wound was Surgical Injury. The wound is located on the Distal,Midline Chest. The wound measures 0.9cm length x 0.8cm width x 1.7cm depth; 0.565cm^2 area and 0.961cm^3 volume. The wound is limited to skin breakdown. There is no tunneling or undermining noted. There is a large amount of serosanguineous drainage noted. The wound margin is flat and intact. There is large (67-100%) red granulation within the wound bed. There is no necrotic tissue within the wound bed. The periwound skin appearance exhibited: Moist. The periwound skin appearance did not exhibit: Callus, Crepitus, Excoriation, Fluctuance, Friable, Induration, Localized Edema, Rash, Scarring, Dry/Scaly, Maceration, Atrophie Blanche, Cyanosis,  Ecchymosis, Hemosiderin Staining, Mottled, Pallor, Rubor, Erythema. Periwound temperature was noted as No Abnormality. 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 May Shower, gently pat wound dry prior to applying new dressing. Anesthetic: Wound #2 Distal,Midline Chest: Topical Lidocaine 4% cream applied to wound bed prior to debridement Primary Wound Dressing: Wound #2 Distal,Midline Chest: Aquacel Ag - rope Secondary Dressing: Wound #2 Distal,Midline Chest: Dry Gauze Other - telfa island Dressing  Change Frequency: Wound #2 Distal,Midline Chest: Change dressing every day. [name], [name] ([id number]) Follow-up Appointments: Wound #2 Distal,Midline Chest: Return Appointment in 1 week. Additional Orders / Instructions: Wound #2 Distal,Midline Chest: Increase protein intake. #1 I'm going to continue the silver alginate. The options here would be another packing strip like Iodo form. Consider Oasis #2 the patient's history from 2 weeks ago was concerning nevertheless her cardiac and respiratory exam today is normal. #3 unrelenting nausea. The patient has a follow-up with her cardiologist in a few days time and apparently they are going to review her medications and make some alterations. The patient has not vomited but she states she is losing weight #4 I advised her to see her primary physician Electronic Signature(s) Signed: 01/28/2016 7:43:54 AM By: [name] MD Entered By: [name] on 01/27/2016 12:31:55 [name], [name] ([id number]) -------------------------------------------------------------------------------- SuperBill Details Patient Name: [name], [name] Date  of Service: 01/27/2016 Medical Record Patient Account Number: [id number] Number: Treating RN: Ahmed Prima [birth year] (75 y.o. 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

## 2016-02-02 NOTE — Progress Notes (Signed)
LAYNAH, CLIVER ([id number]) Visit Report for 01/27/2016 Arrival Information Details Patient Name: [name], [name] Date of Service: 01/27/2016 11:00 AM Medical Record Patient Account Number: [id number] Number: Treating RN: Ahmed Prima May 02, 1940 (75 y.o. Other Clinician: Date of Birth/Sex: Female) Treating ROBSON, MICHAEL Primary Care Physician: Lavera [name] Physician/Extender: G Referring Physician: [name] in Treatment: 4 Visit Information History Since Last Visit All ordered tests and consults were completed: No Patient Arrived: [name] Added or deleted any medications: No Arrival Time: 11:12 Any new allergies or adverse reactions: No Accompanied By: self Had a fall or experienced change in No Transfer Assistance: None activities of daily living that may affect Patient Identification Verified: Yes risk of falls: Secondary Verification Process Completed: Yes Signs or symptoms of abuse/neglect since last No Patient Requires Transmission-Based No visito Precautions: Hospitalized since last visit: No Patient Has Alerts: Yes Has Dressing in Place as Prescribed: Yes Patient Alerts: DMII Pain Present Now: No Electronic Signature(s) Signed: 02/02/2016 8:01:02 AM By: Alric Quan Entered By: Alric Quan on 01/27/2016 11:13:04 [name], [name] ([id number]) -------------------------------------------------------------------------------- Clinic Level of Care Assessment Details Patient Name: [place], [place] A. Date of Service: 01/27/2016 11:00 AM Medical Record Patient Account Number: [id number] Number: Treating RN: Ahmed Prima 01/30/1940 (75 y.o. Other Clinician: Date of Birth/Sex: Female) Treating ROBSON, [place] Primary Care Physician: Lavera [name] Physician/Extender: G Referring Physician: [name] in Treatment: 4 Clinic Level of Care Assessment Items TOOL 4 Quantity Score X - Use when only an EandM is performed on FOLLOW-UP  visit 1 0 ASSESSMENTS - Nursing Assessment / Reassessment X - Reassessment of Co-morbidities (includes updates in patient status) 1 10 X - Reassessment of Adherence to Treatment Plan 1 5 ASSESSMENTS - Wound and Skin Assessment / Reassessment X - Simple Wound Assessment / Reassessment - one wound 1 5 []  - Complex Wound Assessment / Reassessment - multiple wounds 0 []  - Dermatologic / Skin Assessment (not related to wound area) 0 ASSESSMENTS - Focused Assessment []  - Circumferential Edema Measurements - multi extremities 0 []  - Nutritional Assessment / Counseling / Intervention 0 []  - Lower Extremity Assessment (monofilament, tuning fork, pulses) 0 []  - Peripheral Arterial Disease Assessment (using hand held doppler) 0 ASSESSMENTS - Ostomy and/or Continence Assessment and Care []  - Incontinence Assessment and Management 0 []  - Ostomy Care Assessment and Management (repouching, etc.) 0 PROCESS - Coordination of Care X - Simple Patient / Family Education for ongoing care 1 15 []  - Complex (extensive) Patient / Family Education for ongoing care 0 []  - Staff obtains Programmer, systems, Records, Test Results / Process Orders 0 []  - Staff telephones HHA, Nursing Homes / Clarify orders / etc 0 [name], [name] ([id number]) []  - Routine Transfer to another Facility (non-emergent condition) 0 []  - Routine Hospital Admission (non-emergent condition) 0 []  - New Admissions / Biomedical engineer / Ordering NPWT, Apligraf, etc. 0 []  - Emergency Hospital Admission (emergent condition) 0 X - Simple Discharge Coordination 1 10 []  - Complex (extensive) Discharge Coordination 0 PROCESS - Special Needs []  - Pediatric / Minor Patient Management 0 []  - Isolation Patient Management 0 []  - Hearing / Language / Visual special needs 0 []  - Assessment of Community assistance (transportation, D/C planning, etc.) 0 []  - Additional assistance / Altered mentation 0 []  - Support Surface(s) Assessment (bed, cushion, seat,  etc.) 0 INTERVENTIONS - Wound Cleansing / Measurement X - Simple Wound Cleansing - one wound 1 5 []  - Complex Wound Cleansing - multiple wounds 0 X -  Wound Imaging (photographs - any number of wounds) 1 5 []  - Wound Tracing (instead of photographs) 0 X - Simple Wound Measurement - one wound 1 5 []  - Complex Wound Measurement - multiple wounds 0 INTERVENTIONS - Wound Dressings X - Small Wound Dressing one or multiple wounds 1 10 []  - Medium Wound Dressing one or multiple wounds 0 []  - Large Wound Dressing one or multiple wounds 0 X - Application of Medications - topical 1 5 []  - Application of Medications - injection 0 [name], [name] ([id number]) INTERVENTIONS - Miscellaneous []  - External ear exam 0 []  - Specimen Collection (cultures, biopsies, blood, body fluids, etc.) 0 []  - Specimen(s) / Culture(s) sent or taken to Lab for analysis 0 []  - Patient Transfer (multiple staff / Harrel Lemon Lift / Similar devices) 0 []  - Simple Staple / Suture removal (25 or less) 0 []  - Complex Staple / Suture removal (26 or more) 0 []  - Hypo / Hyperglycemic Management (close monitor of Blood Glucose) 0 []  - Ankle / Brachial Index (ABI) - do not check if billed separately 0 X - Vital Signs 1 5 Has the patient been seen at the hospital within the last three years: Yes Total Score: 80 Level Of Care: New/Established - Level 3 Electronic Signature(s) Signed: 02/02/2016 8:01:02 AM By: Alric Quan Entered By: Alric Quan on 01/27/2016 15:16:58 [name], [name] ([id number]) -------------------------------------------------------------------------------- Encounter Discharge Information Details Patient Name: Krantz, Kanyon A. Date of Service: 01/27/2016 11:00 AM Medical Record Patient Account Number: [id number] Number: Treating RN: Ahmed Prima 08-18-1940 (75 y.o. Other Clinician: Date of Birth/Sex: Female) Treating ROBSON, MICHAEL Primary Care Physician: Lavera [name] Physician/Extender:  G Referring Physician: [name] in Treatment: 4 Encounter Discharge Information Items Discharge Pain Level: 0 Discharge Condition: Stable Ambulatory Status: Walker Discharge Destination: Home Transportation: Private Auto Accompanied By: self Schedule Follow-up Appointment: Yes Medication Reconciliation completed Yes and provided to Patient/Care Genni Buske: Provided on Clinical Summary of Care: 01/27/2016 Form Type Recipient Paper Patient AW Electronic Signature(s) Signed: 01/27/2016 11:38:20 AM By: Ruthine Dose Entered By: Ruthine Dose on 01/27/2016 11:38:20 [name], [name] ([id number]) -------------------------------------------------------------------------------- Lower Extremity Assessment Details Patient Name: [name], [name] Date of Service: 01/27/2016 11:00 AM Medical Record Patient Account Number: [id number] Number: Treating RN: Ahmed Prima 1940-07-29 (75 y.o. Other Clinician: Date of Birth/Sex: Female) Treating ROBSON, MICHAEL Primary Care Physician: Lavera [name] Physician/Extender: G Referring Physician: [name] in Treatment: 4 Electronic Signature(s) Signed: 02/02/2016 8:01:02 AM By: Alric Quan Entered By: Alric Quan on 01/27/2016 11:21:40 Finau, Tenika A. ([id number]) -------------------------------------------------------------------------------- Multi Wound Chart Details Patient Name: Pernell, Babygirl A. Date of Service: 01/27/2016 11:00 AM Medical Record Patient Account Number: [id number] Number: Treating RN: Ahmed Prima [birth year] (75 y.o. Other Clinician: Date of Birth/Sex: Female) Treating ROBSON, MICHAEL Primary Care Physician: Lavera [name] Physician/Extender: G Referring Physician: [name] in Treatment: 4 Vital Signs Height(in): 62 Pulse(bpm): 60 Weight(lbs): 161 Blood Pressure 153/56 (mmHg): Body Mass Index(BMI): 29 Temperature(F): 97.6 Respiratory  Rate 18 (breaths/min): Photos: [1:No Photos] [N/A:N/A] Wound Location: Chest - Midline, Proximal Chest - Midline, Distal N/A Wounding Event: Surgical Injury Surgical Injury N/A Primary Etiology: Open Surgical Wound Open Surgical Wound N/A Comorbid History: Cataracts, Anemia, Cataracts, Anemia, N/A Congestive Heart Failure, Congestive Heart Failure, Coronary Artery Disease, Coronary Artery Disease, Hypertension, Type II Hypertension, Type II Diabetes, Osteoarthritis, Diabetes, Osteoarthritis, Neuropathy, Received Neuropathy, Received Chemotherapy Chemotherapy Date Acquired: 11/11/2015 11/11/2015 N/A Weeks of Treatment: 4 3 N/A Wound Status: Open Open N/A Measurements L  x W x D 0x0x0 0.9x0.8x1.7 N/A (cm) Area (cm) : 0 0.565 N/A Volume (cm) : 0 0.961 N/A % Reduction in Area: 100.00% -105.50% N/A % Reduction in Volume: 100.00% -66.60% N/A Classification: N/A Guess, Makyra A. ([id number]) Full Thickness Without Full Thickness Without Exposed Support Exposed Support Structures Structures Exudate Amount: None Present Large N/A Exudate Type: N/A Serosanguineous N/A Exudate Color: N/A red, brown N/A Wound Margin: Flat and Intact Flat and Intact N/A Granulation Amount: None Present (0%) Large (67-100%) N/A Granulation Quality: N/A Red N/A Necrotic Amount: None Present (0%) None Present (0%) N/A Exposed Structures: Fascia: No Fascia: No N/A Fat: No Fat: No Tendon: No Tendon: No Muscle: No Muscle: No Joint: No Joint: No Bone: No Bone: No Limited to Skin Limited to Skin Breakdown Breakdown Epithelialization: Large (67-100%) None N/A Periwound Skin Texture: Edema: Yes Edema: No N/A Excoriation: No Excoriation: No Induration: No Induration: No Callus: No Callus: No Crepitus: No Crepitus: No Fluctuance: No Fluctuance: No Friable: No Friable: No Rash: No Rash: No Scarring: No Scarring: No Periwound Skin Maceration: No Moist: Yes N/A Moisture: Moist:  No Maceration: No Dry/Scaly: No Dry/Scaly: No Periwound Skin Color: Erythema: Yes Atrophie Blanche: No N/A Atrophie Blanche: No Cyanosis: No Cyanosis: No Ecchymosis: No Ecchymosis: No Erythema: No Hemosiderin Staining: No Hemosiderin Staining: No Mottled: No Mottled: No Pallor: No Pallor: No Rubor: No Rubor: No Erythema Location: Circumferential N/A N/A Temperature: No Abnormality No Abnormality N/A Tenderness on No No N/A Palpation: Wound Preparation: Ulcer Cleansing: Ulcer Cleansing: N/A Rinsed/Irrigated with Rinsed/Irrigated with Saline Saline Topical Anesthetic Applied: Other: lidocaine 4% [name], [name] ([id number]) Treatment Notes Wound #2 (Distal, Midline Chest) 1. Cleansed with: Clean wound with Normal Saline 2. Anesthetic Topical Lidocaine 4% cream to wound bed prior to debridement 4. Dressing Applied: Aquacel Ag 5. Secondary [place] Signature(s) Signed: 01/28/2016 7:43:54 AM By: [name] MD Entered By: [name] on 01/27/2016 12:26:03 Snoke, [name] ([id number]) -------------------------------------------------------------------------------- [place] Details Patient Name: Baumgart, Kenishia A. Date of Service: 01/27/2016 11:00 AM Medical Record Patient Account Number: [id number] Number: Treating RN: Ahmed Prima Aug 04, 1940 (75 y.o. Other Clinician: Date of Birth/Sex: Female) Treating ROBSON, MICHAEL Primary Care Physician: Lavera [name] Physician/Extender: G Referring Physician: [name] in Treatment: 4 Active Inactive Abuse / Safety / Falls / Self Care Management Nursing Diagnoses: Impaired physical mobility Potential for falls Goals: Patient will remain injury free Date Initiated: 12/24/2015 Goal Status: Active Interventions: Assess fall risk on admission and as needed Notes: Orientation to the Wound Care Program Nursing Diagnoses: Knowledge deficit related  to the wound healing center program Goals: Patient/caregiver will verbalize understanding of the [place] Program Date Initiated: 12/24/2015 Goal Status: Active Interventions: Provide education on orientation to the wound center Notes: Wound/Skin Impairment Nursing Diagnoses: Impaired tissue integrity Hedges, Melayna A. ([id number]) Goals: Patient/caregiver will verbalize understanding of skin care regimen Date Initiated: 12/24/2015 Goal Status: Active Ulcer/skin breakdown will have a volume reduction of 30% by week 4 Date Initiated: 12/24/2015 Goal Status: Active Ulcer/skin breakdown will have a volume reduction of 50% by week 8 Date Initiated: 12/24/2015 Goal Status: Active Ulcer/skin breakdown will have a volume reduction of 80% by week 12 Date Initiated: 12/24/2015 Goal Status: Active Ulcer/skin breakdown will heal within 14 weeks Date Initiated: 12/24/2015 Goal Status: Active Interventions: Assess patient/caregiver ability to obtain necessary supplies Assess patient/caregiver ability to perform ulcer/skin care regimen upon admission and as needed Assess ulceration(s) every visit Notes: Electronic Signature(s) Signed: 02/02/2016  8:01:02 AM By: Alric Quan Entered By: Alric Quan on 01/27/2016 11:21:43 Haymond, Cataleya AMarland Kitchen ([id number]) -------------------------------------------------------------------------------- Pain Assessment Details Patient Name: [name], [name] Date of Service: 01/27/2016 11:00 AM Medical Record Patient Account Number: [id number] Number: Treating RN: Ahmed Prima August 28, 1940 (75 y.o. Other Clinician: Date of Birth/Sex: Female) Treating ROBSON, MICHAEL Primary Care Physician: Lavera [name] Physician/Extender: G Referring Physician: [name] in Treatment: 4 Active Problems Location of Pain Severity and Description of Pain Patient Has Paino No Site Locations With Dressing Change: No Pain Management and  Medication Current Pain Management: Electronic Signature(s) Signed: 02/02/2016 8:01:02 AM By: Alric Quan Entered By: Alric Quan on 01/27/2016 11:13:15 Vallo, [name] ([id number]) -------------------------------------------------------------------------------- Patient/Caregiver Education Details Patient Name: [name], [name] Date of Service: 01/27/2016 11:00 AM Medical Record Patient Account Number: [id number] Number: Treating RN: Ahmed Prima Mar 26, 1940 (75 y.o. Other Clinician: Date of Birth/Gender: Female) Treating ROBSON, MICHAEL Primary Care Physician: Lavera [name] Physician/Extender: G Referring Physician: [name] in Treatment: 4 Education Assessment Education Provided To: Patient Education Topics Provided Wound/Skin Impairment: Handouts: Other: change dressing as ordered Methods: Demonstration, Explain/Verbal Responses: State content correctly Electronic Signature(s) Signed: 02/02/2016 8:01:02 AM By: Alric Quan Entered By: Alric Quan on 01/27/2016 11:37:14 [name], [name] ([id number]) -------------------------------------------------------------------------------- Wound Assessment Details Patient Name: Tiberio, Joycie A. Date of Service: 01/27/2016 11:00 AM Medical Record Patient Account Number: [id number] Number: Treating RN: Ahmed Prima 1940/12/07 (75 y.o. Other Clinician: Date of Birth/Sex: Female) Treating ROBSON, MICHAEL Primary Care Physician: Lavera [name] Physician/Extender: G Referring Physician: [name] in Treatment: 4 Wound Status Wound Number: 1 Primary Open Surgical Wound Etiology: Wound Location: Chest - Midline, Proximal Wound Open Wounding Event: Surgical Injury Status: Date Acquired: 11/11/2015 Comorbid Cataracts, Anemia, Congestive Heart Weeks Of Treatment: 4 History: Failure, Coronary Artery Disease, Clustered Wound: No Hypertension, Type II Diabetes, Osteoarthritis,  Neuropathy, Received Chemotherapy Wound Measurements Length: (cm) 0 % Reduction Width: (cm) 0 % Reduction Depth: (cm) 0 Epitheliali Area: (cm) 0 Tunneling: Volume: (cm) 0 Underminin in Area: 100% in Volume: 100% zation: Large (67-100%) No g: No Wound Description Full Thickness Without Exposed Classification: Support Structures Wound Margin: Flat and Intact Exudate None Present Amount: Foul Odor After Cleansing: No Wound Bed Granulation Amount: None Present (0%) Exposed Structure Necrotic Amount: None Present (0%) Fascia Exposed: No Fat Layer Exposed: No Tendon Exposed: No Muscle Exposed: No Joint Exposed: No Bone Exposed: No Limited to Skin Breakdown Periwound Skin Texture Texture Color No Abnormalities Noted: No No Abnormalities Noted: No Harpster, Tam A. ([id number]) Callus: No Atrophie Blanche: No Crepitus: No Cyanosis: No Excoriation: No Ecchymosis: No Fluctuance: No Erythema: Yes Friable: No Erythema Location: Circumferential Induration: No Hemosiderin Staining: No Localized Edema: Yes Mottled: No Rash: No Pallor: No Scarring: No Rubor: No Moisture Temperature / Pain No Abnormalities Noted: No Temperature: No Abnormality Dry / Scaly: No Maceration: No Moist: No Wound Preparation Ulcer Cleansing: Rinsed/Irrigated with Saline Electronic Signature(s) Signed: 02/02/2016 8:01:02 AM By: Alric Quan Entered By: Alric Quan on 01/27/2016 11:21:25 [name], [name] ([id number]) -------------------------------------------------------------------------------- Wound Assessment Details Patient Name: [name], [name] Date of Service: 01/27/2016 11:00 AM Medical Record Patient Account Number: [id number] Number: Treating RN: Ahmed Prima 08-02-1940 (75 y.o. Other Clinician: Date of Birth/Sex: Female) Treating ROBSON, MICHAEL Primary Care Physician: Lavera [name] Physician/Extender: G Referring Physician: [name] in  Treatment: 4 Wound Status Wound Number: 2 Primary Open Surgical Wound Etiology: Wound Location: Chest - Midline, Distal Wound Open Wounding Event: Surgical Injury Status: Date  Acquired: 11/11/2015 Comorbid Cataracts, Anemia, Congestive Heart Weeks Of Treatment: 3 History: Failure, Coronary Artery Disease, Clustered Wound: No Hypertension, Type II Diabetes, Osteoarthritis, Neuropathy, Received Chemotherapy Photos Photo Uploaded By: Alric Quan on 01/27/2016 11:46:32 Wound Measurements Length: (cm) 0.9 Width: (cm) 0.8 Depth: (cm) 1.7 Area: (cm) 0.565 Volume: (cm) 0.961 % Reduction in Area: -105.5% % Reduction in Volume: -66.6% Epithelialization: None Tunneling: No Undermining: No Wound Description Full Thickness Without Exposed Foul Odor Afte Classification: Support Structures Wound Margin: Flat and Intact Exudate Large Amount: Exudate Type: Serosanguineous Exudate Color: red, brown Blondin, Sujey A. ([id number]) r Cleansing: No Wound Bed Granulation Amount: Large (67-100%) Exposed Structure Granulation Quality: Red Fascia Exposed: No Necrotic Amount: None Present (0%) Fat Layer Exposed: No Tendon Exposed: No Muscle Exposed: No Joint Exposed: No Bone Exposed: No Limited to Skin Breakdown Periwound Skin Texture Texture Color No Abnormalities Noted: No No Abnormalities Noted: No Callus: No Atrophie Blanche: No Crepitus: No Cyanosis: No Excoriation: No Ecchymosis: No Fluctuance: No Erythema: No Friable: No Hemosiderin Staining: No Induration: No Mottled: No Localized Edema: No Pallor: No Rash: No Rubor: No Scarring: No Temperature / Pain Moisture Temperature: No Abnormality No Abnormalities Noted: No Dry / Scaly: No Maceration: No Moist: Yes Wound Preparation Ulcer Cleansing: Rinsed/Irrigated with Saline Topical Anesthetic Applied: Other: lidocaine 4%, Treatment Notes Wound #2 (Distal, Midline Chest) 1. Cleansed with: Clean wound  with Normal Saline 2. Anesthetic Topical Lidocaine 4% cream to wound bed prior to debridement 4. Dressing Applied: Aquacel Ag 5. Secondary [place] Signature(s) Signed: 02/02/2016 8:01:02 AM By: Vivi Ferns, Jolane A. ([id number]) Entered By: Alric Quan on 01/27/2016 11:31:02 Beranek, Delbra AMarland Kitchen ([id number]) -------------------------------------------------------------------------------- Vitals Details Patient Name: Lorio, Dynasti A. Date of Service: 01/27/2016 11:00 AM Medical Record Patient Account Number: [id number] Number: Treating RN: Ahmed Prima 16-Mar-1940 (75 y.o. Other Clinician: Date of Birth/Sex: Female) Treating ROBSON, MICHAEL Primary Care Physician: Lavera [name] Physician/Extender: G Referring Physician: [name] in Treatment: 4 Vital Signs Time Taken: 11:14 Temperature (F): 97.6 Height (in): 62 Pulse (bpm): 60 Weight (lbs): 161 Respiratory Rate (breaths/min): 18 Body Mass Index (BMI): 29.4 Blood Pressure (mmHg): 153/56 Reference Range: 80 - 120 mg / dl Electronic Signature(s) Signed: 02/02/2016 8:01:02 AM By: Alric Quan Entered By: Alric Quan on 01/27/2016 11:16:08

## 2016-02-03 ENCOUNTER — Encounter: Payer: Medicare Other | Admitting: Internal Medicine

## 2016-02-03 ENCOUNTER — Ambulatory Visit

## 2016-02-03 ENCOUNTER — Encounter: Payer: Self-pay | Admitting: *Deleted

## 2016-02-03 DIAGNOSIS — L98498 Non-pressure chronic ulcer of skin of other sites with other specified severity: Secondary | ICD-10-CM | POA: Diagnosis not present

## 2016-02-03 DIAGNOSIS — Z951 Presence of aortocoronary bypass graft: Secondary | ICD-10-CM

## 2016-02-03 NOTE — Progress Notes (Signed)
Cardiac Individual Treatment Plan  Patient Details  Name: Opal Dinning Deats MRN: [id number] Date of Birth: 12/03/1940 Referring Provider:   Flowsheet Row Cardiac Rehab from 12/15/2015 in [facility] Cardiac and Pulmonary Rehab  Referring Provider  Harle Stanford MD      Initial Encounter Date:  Flowsheet Row Cardiac Rehab from 12/15/2015 in [facility] Cardiac and Pulmonary Rehab  Date  12/15/15  Referring Provider  Harle Stanford MD      Visit Diagnosis: S/P CABG x 2  Patient's Home Medications on Admission:  Current Outpatient Prescriptions:  .  aspirin 81 MG tablet, Take 81 mg by mouth daily., Disp: , Rfl:  .  Cholecalciferol (VITAMIN D3) 50000 units CAPS, Take 1 capsule by mouth 2 (two) times a week., Disp: , Rfl:  .  clopidogrel (PLAVIX) 75 MG tablet, Take 75 mg by mouth daily., Disp: , Rfl:  .  diclofenac sodium (VOLTAREN) 1 % GEL, Apply 2 g topically daily as needed for pain., Disp: , Rfl:  .  fentaNYL (DURAGESIC - DOSED MCG/HR) 25 MCG/HR patch, Place 25 mcg onto the skin every 3 (three) days., Disp: , Rfl:  .  gabapentin (NEURONTIN) 300 MG capsule, Take 300-600 mg by mouth See admin instructions. Pt takes 665m in am, 3039mat bedtime, Disp: , Rfl:  .  hydrocortisone cream 1 %, Apply topically as needed for itching., Disp: 30 g, Rfl: 0 .  HYDROmorphone (DILAUDID) 2 MG tablet, Take 2 mg by mouth every 4 (four) hours as needed for severe pain., Disp: , Rfl:  .  isosorbide mononitrate (IMDUR) 60 MG 24 hr tablet, Take 1 tablet (60 mg total) by mouth daily., Disp: 30 tablet, Rfl: 12 .  lisinopril (PRINIVIL,ZESTRIL) 5 MG tablet, Take 1 tablet (5 mg total) by mouth daily., Disp: 30 tablet, Rfl: 12 .  metFORMIN (GLUCOPHAGE) 500 MG tablet, Take 1,000 mg by mouth 2 (two) times daily with a meal. , Disp: , Rfl:  .  metoprolol tartrate (LOPRESSOR) 25 MG tablet, Take 1 tablet (25 mg total) by mouth 2 (two) times daily., Disp: 60 tablet, Rfl: 12 .  nitroGLYCERIN (NITROSTAT) 0.4 MG SL tablet, Place 1  tablet (0.4 mg total) under the tongue every 5 (five) minutes as needed for chest pain., Disp: 25 tablet, Rfl: 2 .  ondansetron (ZOFRAN-ODT) 4 MG disintegrating tablet, Take 4 mg by mouth every 8 (eight) hours as needed for nausea or vomiting., Disp: , Rfl:  .  senna-docusate (SENOKOT-S) 8.6-50 MG tablet, Take 2 tablets by mouth 2 (two) times daily as needed for constipation., Disp: , Rfl:   Past Medical History: Past Medical History:  Diagnosis Date  . Anginal pain ([facility] [place]  . Anxiety   . Arthritis    "shoulders primarily" (01/13/2016)  . CHF (congestive heart failure) ([facility]  . Chronic pain    from her multiple myeloma but says that her pain is usually in her legs/notes 01/13/2016  . High cholesterol   . History of blood transfusion    "numerous; related to procedures for my heart" (01/13/2016)  . Hypertension   . Ischemic cardiomyopathy    /nots 01/13/2016  . MI (myocardial infarction)    EKG on arrival 01/13/2016 showed NSR with evidence of old anterior and inferior infarcts  . MI (myocardial infarction) 08/2015  . Multiple myeloma (HCMcCookdx'd 2015  . PAD (peripheral artery disease) ([facility]  . St[facility] ([facility]   "several in 1 year; ? year" (01/13/2016)  . Type II diabetes mellitus ([facility]  Tobacco Use: History  Smoking Status  . Never Smoker  Smokeless Tobacco  . Never Used    Labs: Recent Review Flowsheet Data    Labs for ITP Cardiac and Pulmonary Rehab Latest Ref Rng & Units 03/05/2010 01/14/2016   Cholestrol 0 - 200 mg/dL - 207(H)   LDLCALC 0 - 99 mg/dL - 133(H)   HDL >40 mg/dL - 31(L)   Trlycerides <150 mg/dL - 214(H)   Hemoglobin A1c 4.8 - 5.6 % - 7.6(H)   TCO2 0 - 100 mmol/L 22 -       Exercise Target Goals:    Exercise Program Goal: Individual exercise prescription set with THRR, safety & activity barriers. Participant demonstrates ability to understand and report RPE using BORG scale, to self-measure pulse accurately, and to acknowledge the importance of the exercise  prescription.  Exercise Prescription Goal: Starting with aerobic activity 30 plus minutes a day, 3 days per week for initial exercise prescription. Provide home exercise prescription and guidelines that participant acknowledges understanding prior to discharge.  Activity Barriers & Risk Stratification:     Activity Barriers & Cardiac Risk Stratification - 12/15/15 1432      Activity Barriers & Cardiac Risk Stratification   Activity Barriers Arthritis  Right shoulder,left knee arthritis very painful. myeloma causing pain in legs sometimes sever   Cardiac Risk Stratification High      6 Minute Walk:     6 Minute Walk    Row Name 12/15/15 1504         6 Minute Walk   Phase Initial     Distance 500 feet     Walk Time 6 minutes     # of Rest Breaks 0     MPH 0.95     METS 1.7     RPE 13     Perceived Dyspnea  3     VO2 Peak 3.72     Symptoms Yes (comment)     Comments SOB, knee pain 6/10     Resting HR 58 bpm     Resting BP 130/66     Max Ex. HR 102 bpm     Max Ex. BP 130/64     2 Minute Post BP 124/56        Initial Exercise Prescription:     Initial Exercise Prescription - 12/15/15 1500      Date of Initial Exercise RX and Referring Provider   Date 12/15/15   Referring Provider Harle Stanford MD     NuStep   Level 1   Watts --  60-80 spm   Minutes 15   METs 1.7     Arm Ergometer   Level 1   Watts --  25-35 rpm   Minutes 15   METs 1.7     Track   Laps 15   Minutes 15   METs 1.7     Prescription Details   Frequency (times per week) 2   Duration Progress to 45 minutes of aerobic exercise without signs/symptoms of physical distress     Intensity   THRR 40-80% of Max Heartrate 93-128   Ratings of Perceived Exertion 11-13   Perceived Dyspnea 0-4     Progression   Progression Continue to progress workloads to maintain intensity without signs/symptoms of physical distress.     Resistance Training   Training Prescription Yes   Weight 1 lbs    Reps 10-12      Perform Capillary Blood Glucose checks as needed.  Exercise Prescription Changes:     Exercise Prescription Changes    Row Name 12/15/15 1500             Exercise Review   Progression -  walk test results         Response to Exercise   Blood Pressure (Admit) 130/66       Blood Pressure (Exercise) 130/64       Blood Pressure (Exit) 124/56       Heart Rate (Admit) 58 bpm       Heart Rate (Exercise) 91 bpm       Heart Rate (Exit) 57 bpm       Oxygen Saturation (Admit) 93 %       Oxygen Saturation (Exercise) 100 %       Rating of Perceived Exertion (Exercise) 13       Symptoms SOB, knee pain 6/10          Exercise Comments:     Exercise Comments    Row Name 12/15/15 1508           Exercise Comments Biggest exercise goal is to increase her stamina to get back to doing things and able to go out with her friends.          Discharge Exercise Prescription (Final Exercise Prescription Changes):     Exercise Prescription Changes - 12/15/15 1500      Exercise Review   Progression --  walk test results     Response to Exercise   Blood Pressure (Admit) 130/66   Blood Pressure (Exercise) 130/64   Blood Pressure (Exit) 124/56   Heart Rate (Admit) 58 bpm   Heart Rate (Exercise) 91 bpm   Heart Rate (Exit) 57 bpm   Oxygen Saturation (Admit) 93 %   Oxygen Saturation (Exercise) 100 %   Rating of Perceived Exertion (Exercise) 13   Symptoms SOB, knee pain 6/10      Nutrition:  Target Goals: Understanding of nutrition guidelines, daily intake of sodium <1539m, cholesterol <2077m calories 30% from fat and 7% or less from saturated fats, daily to have 5 or more servings of fruits and vegetables.  Biometrics:     Pre Biometrics - 12/15/15 1509      Pre Biometrics   Height 5' 2.1" (1.577 m)   Weight 166 lb (75.3 kg)   Waist Circumference 35 inches   Hip Circumference 44.5 inches   Waist to Hip Ratio 0.79 %   BMI (Calculated) 30.3   Single  Leg Stand 0 seconds       Nutrition Therapy Plan and Nutrition Goals:     Nutrition Therapy & Goals - 12/15/15 1420      Intervention Plan   Intervention Prescribe, educate and counsel regarding individualized specific dietary modifications aiming towards targeted core components such as weight, hypertension, lipid management, diabetes, heart failure and other comorbidities.   Expected Outcomes Short Term Goal: Understand basic principles of dietary content, such as calories, fat, sodium, cholesterol and nutrients.;Short Term Goal: A plan has been developed with personal nutrition goals set during dietitian appointment.;Long Term Goal: Adherence to prescribed nutrition plan.      Nutrition Discharge: Rate Your Plate Scores:     Nutrition Assessments - 12/15/15 1420      Rate Your Plate Scores   Pre Score 60   Pre Score % 66.6 %      Nutrition Goals Re-Evaluation:   Psychosocial: Target Goals: Acknowledge presence or absence of depression, maximize coping skills,  provide positive support system. Participant is able to verbalize types and ability to use techniques and skills needed for reducing stress and depression.  Initial Review & Psychosocial Screening:     Initial Psych Review & Screening - 12/15/15 1425      Initial Review   Current issues with Current Sleep Concerns  Sleep concerns since surgery     Family Dynamics   Good Support System? --  Son,daughter and friends     Barriers   Psychosocial barriers to participate in program There are no identifiable barriers or psychosocial needs.;The patient should benefit from training in stress management and relaxation.     Screening Interventions   Interventions Encouraged to exercise      Quality of Life Scores:     Quality of Life - 12/15/15 1428      Quality of Life Scores   Health/Function Pre 13.53 %   Socioeconomic Pre 18.6 %   Psych/Spiritual Pre 21 %   Family Pre 15.3 %   GLOBAL Pre 16.08 %       PHQ-9: Recent Review Flowsheet Data    Depression screen [facility] 2/9 12/15/2015   Decreased Interest 0   Down, Depressed, Hopeless 0   PHQ - 2 Score 0   Altered sleeping 3    Tired, decreased energy 2    Change in appetite 0   Feeling bad or failure about yourself  0   Trouble concentrating 0   Moving slowly or fidgety/restless 0   Suicidal thoughts 0   PHQ-9 Score 5      Psychosocial Evaluation and Intervention:   Psychosocial Re-Evaluation:   Vocational Rehabilitation: Provide vocational rehab assistance to qualifying candidates.   Vocational Rehab Evaluation & Intervention:     Vocational Rehab - 12/15/15 1443      Initial Vocational Rehab Evaluation & Intervention   Assessment shows need for Vocational Rehabilitation No      Education: Education Goals: Education classes will be provided on a weekly basis, covering required topics. Participant will state understanding/return demonstration of topics presented.  Learning Barriers/Preferences:     Learning Barriers/Preferences - 12/15/15 1441      Learning Barriers/Preferences   Learning Barriers None   Learning Preferences Verbal Instruction      Education Topics: General Nutrition Guidelines/Fats and Fiber: -Group instruction provided by verbal, written material, models and posters to present the general guidelines for heart healthy nutrition. Gives an explanation and review of dietary fats and fiber.   Controlling Sodium/Reading Food Labels: -Group verbal and written material supporting the discussion of sodium use in heart healthy nutrition. Review and explanation with models, verbal and written materials for utilization of the food label.   Exercise Physiology & Risk Factors: - Group verbal and written instruction with models to review the exercise physiology of the cardiovascular system and associated critical values. Details cardiovascular disease risk factors and the goals associated with each risk  factor.   Aerobic Exercise & Resistance Training: - Gives group verbal and written discussion on the health impact of inactivity. On the components of aerobic and resistive training programs and the benefits of this training and how to safely progress through these programs.   Flexibility, Balance, General Exercise Guidelines: - Provides group verbal and written instruction on the benefits of flexibility and balance training programs. Provides general exercise guidelines with specific guidelines to those with heart or lung disease. Demonstration and skill practice provided.   Stress Management: - Provides group verbal and written instruction about  the health risks of elevated stress, cause of high stress, and healthy ways to reduce stress.   Depression: - Provides group verbal and written instruction on the correlation between heart/lung disease and depressed mood, treatment options, and the stigmas associated with seeking treatment.   Anatomy & Physiology of the Heart: - Group verbal and written instruction and models provide basic cardiac anatomy and physiology, with the coronary electrical and arterial systems. Review of: AMI, Angina, Valve disease, Heart Failure, Cardiac Arrhythmia, Pacemakers, and the ICD.   Cardiac Procedures: - Group verbal and written instruction and models to describe the testing methods done to diagnose heart disease. Reviews the outcomes of the test results. Describes the treatment choices: Medical Management, Angioplasty, or Coronary Bypass Surgery.   Cardiac Medications: - Group verbal and written instruction to review commonly prescribed medications for heart disease. Reviews the medication, class of the drug, and side effects. Includes the steps to properly store meds and maintain the prescription regimen.   Go Sex-Intimacy & Heart Disease, Get SMART - Goal Setting: - Group verbal and written instruction through game format to discuss heart disease and  the return to sexual intimacy. Provides group verbal and written material to discuss and apply goal setting through the application of the S.M.A.R.T. Method.   Other Matters of the Heart: - Provides group verbal, written materials and models to describe Heart Failure, Angina, Valve Disease, and Diabetes in the realm of heart disease. Includes description of the disease process and treatment options available to the cardiac patient.   Exercise & Equipment Safety: - Individual verbal instruction and demonstration of equipment use and safety with use of the equipment. Flowsheet Row Cardiac Rehab from 12/15/2015 in [facility] Cardiac and Pulmonary Rehab  Date  12/15/15  Educator  SB  Instruction Review Code  2- meets goals/outcomes      Infection Prevention: - Provides verbal and written material to individual with discussion of infection control including proper hand washing and proper equipment cleaning during exercise session. Flowsheet Row Cardiac Rehab from 12/15/2015 in [facility] Cardiac and Pulmonary Rehab  Date  12/15/15  Educator  SB  Instruction Review Code  2- meets goals/outcomes      Falls Prevention: - Provides verbal and written material to individual with discussion of falls prevention and safety. Flowsheet Row Cardiac Rehab from 12/15/2015 in [facility] Cardiac and Pulmonary Rehab  Date  12/15/15  Educator  SB  Instruction Review Code  2- meets goals/outcomes      Diabetes: - Individual verbal and written instruction to review signs/symptoms of diabetes, desired ranges of glucose level fasting, after meals and with exercise. Advice that pre and post exercise glucose checks will be done for 3 sessions at entry of program. Flowsheet Row Cardiac Rehab from 12/15/2015 in [facility] Cardiac and Pulmonary Rehab  Date  12/15/15  Educator  SB  Instruction Review Code  2- meets goals/outcomes       Knowledge Questionnaire Score:     Knowledge Questionnaire Score - 12/15/15 1442       Knowledge Questionnaire Score   Pre Score 18/28      Core Components/Risk Factors/Patient Goals at Admission:     Personal Goals and Risk Factors at Admission - 12/15/15 1443      Core Components/Risk Factors/Patient Goals on Admission    Weight Management Obesity;Yes   Intervention Weight Management: Develop a combined nutrition and exercise program designed to reach desired caloric intake, while maintaining appropriate intake of nutrient and fiber, sodium and fats, and  appropriate energy expenditure required for the weight goal.;Weight Management: Provide education and appropriate resources to help participant work on and attain dietary goals.;Weight Management/Obesity: Establish reasonable short term and long term weight goals.   Admit Weight 166 lb (75.3 kg)   Goal Weight: Short Term 163 lb (73.9 kg)   Goal Weight: Long Term 140 lb (63.5 kg)   Expected Outcomes Short Term: Continue to assess and modify interventions until short term weight is achieved;Long Term: Adherence to nutrition and physical activity/exercise program aimed toward attainment of established weight goal;Weight Loss: Understanding of general recommendations for a balanced deficit meal plan, which promotes 1-2 lb weight loss per week and includes a negative energy balance of [phone] kcal/d   Sedentary Yes   Intervention Develop an individualized exercise prescription for aerobic and resistive training based on initial evaluation findings, risk stratification, comorbidities and participant's personal goals.;Provide advice, education, support and counseling about physical activity/exercise needs.   Expected Outcomes Achievement of increased cardiorespiratory fitness and enhanced flexibility, muscular endurance and strength shown through measurements of functional capacity and personal statement of participant.   Increase Strength and Stamina Yes   Intervention Provide advice, education, support and counseling about physical  activity/exercise needs.;Develop an individualized exercise prescription for aerobic and resistive training based on initial evaluation findings, risk stratification, comorbidities and participant's personal goals.   Expected Outcomes Achievement of increased cardiorespiratory fitness and enhanced flexibility, muscular endurance and strength shown through measurements of functional capacity and personal statement of participant.   Diabetes Yes   Intervention Provide education about signs/symptoms and action to take for hypo/hyperglycemia.;Provide education about proper nutrition, including hydration, and aerobic/resistive exercise prescription along with prescribed medications to achieve blood glucose in normal ranges: Fasting glucose 65-99 mg/dL   Expected Outcomes Short Term: Participant verbalizes understanding of the signs/symptoms and immediate care of hyper/hypoglycemia, proper foot care and importance of medication, aerobic/resistive exercise and nutrition plan for blood glucose control.;Long Term: Attainment of HbA1C < 7%.   Hypertension Yes   Intervention Provide education on lifestyle modifcations including regular physical activity/exercise, weight management, moderate sodium restriction and increased consumption of fresh fruit, vegetables, and low fat dairy, alcohol moderation, and smoking cessation.;Monitor prescription use compliance.   Expected Outcomes Short Term: Continued assessment and intervention until BP is < 140/26m HG in hypertensive participants. < 130/862mHG in hypertensive participants with diabetes, heart failure or chronic kidney disease.;Long Term: Maintenance of blood pressure at goal levels.   Lipids Yes   Intervention Provide education and support for participant on nutrition & aerobic/resistive exercise along with prescribed medications to achieve LDL <7037mHDL >30m65m Expected Outcomes Short Term: Participant states understanding of desired cholesterol values and is  compliant with medications prescribed. Participant is following exercise prescription and nutrition guidelines.;Long Term: Cholesterol controlled with medications as prescribed, with individualized exercise RX and with personalized nutrition plan. Value goals: LDL < 70mg84mL > 40 mg.      Core Components/Risk Factors/Patient Goals Review:    Core Components/Risk Factors/Patient Goals at Discharge (Final Review):    ITP Comments:     ITP Comments    Row Name 12/15/15 1416 12/17/15 0708 01/06/16 1608 01/13/16 0628 01/20/16 1522   ITP Comments Medical review completed with initial ITP created. Diagnosis documentation can be found Care Everywhere [facility] post op visit 11/27/2015 30 day review completed for review by Dr Mark Emily Filbertntinue with ITP unless changes noted by Dr MIlleSabra Heckll  return after wound is closed. Judy Bethena Roysn  hold for open wound infection.  Awaiting clearance to start. 30 day review. Continue with ITP unless directed changes per Medical Director review. Remains absent secondary to infected incision and subsequent wound care, Bethena Roys was hospitalized for chest pain/back pain from 1/2-1/4.  Her cath was clean and deemed as stable angina.  She is to continue her wound care.  She will be following up with Dr. Meda Coffee in February.   Row Name 02/03/16 1417           ITP Comments Bethena Roys continues to have wound care that will be ongoing for some time.  We will drop her from the program at this time.          Comments: Discharge ITP

## 2016-02-04 ENCOUNTER — Other Ambulatory Visit: Payer: Self-pay | Admitting: Family Medicine

## 2016-02-04 DIAGNOSIS — R1031 Right lower quadrant pain: Secondary | ICD-10-CM

## 2016-02-04 DIAGNOSIS — R112 Nausea with vomiting, unspecified: Secondary | ICD-10-CM

## 2016-02-04 DIAGNOSIS — R1011 Right upper quadrant pain: Secondary | ICD-10-CM

## 2016-02-04 NOTE — Progress Notes (Signed)
JESYCA, WEISENBURGER ([id number]) Visit Report for 02/03/2016 Chief Complaint Document Details Patient Name: [name], [name] Date of Service: 02/03/2016 9:15 AM Medical Record Patient Account Number: [id number] Number: Treating RN: Montey Hora [birth year] (76 y.o. Other Clinician: Date of Birth/Sex: Female) Treating Shellia Hartl Primary Care Provider: Lavera [name] Provider/Extender: G Referring Provider: [name] in Treatment: 5 Information Obtained from: Patient Chief Complaint 12/24/15 patient is here for review of to nonhealing surgical wounds in a CABG scar Electronic Signature(s) Signed: 02/03/2016 4:44:13 PM By: [name] MD Entered By: [name] on 02/03/2016 09:32:38 Helzer, Adalene A. ([id number]) -------------------------------------------------------------------------------- HPI Details Patient Name: [name], [name] Date of Service: 02/03/2016 9:15 AM Medical Record Patient Account Number: [id number] Number: Treating RN: Montey Hora 1940/08/03 (76 y.o. Other Clinician: Date of Birth/Sex: Female) Treating Salah Nakamura Primary Care Provider: Lavera [name] Provider/Extender: G Referring Provider: [name] in Treatment: 5 History of Present Illness HPI Description: 12/24/15; [name] is a 76 year old woman who is the mother of one of our other clinic patient's [name]. He comes [place] today for review a nonhealing surgical CABG wound. The patient underwent CABG o2 on October 31 17 at [facility]. She apparently had had a myocardial infarction in October but briefly presented with shortness of breath. She underwent surgery which I understand was uneventful. According to the patient her surgical incision never really healed in 2 spots. One is at the inferior recess question drain site, the other more superiorly about an inch from the superior surface of this it incision. She has been followed by her surgeon at  [facility] who felt that both areas which he'll according to the patient. She was put on Augmentin 2 weeks ago and she is finished a course of this culture at that time showed Escherichia coli which should've been sensitive. The patient also relates that she has a history of allergy to ampicillin. Besides this the patient has a history of type 2 diabetes on metformin, multiple myeloma. She had a chest x-ray in mid October the did not show any abnormalities. I cannot see that she had any specific views of the sternum however. We have her referral note from her primary physician Dr. Bernie Covey. She was concerned about erythema around the wound and as mentioned the culture here showed Escherichia coli which should have been Augmentin sensitive but was resistant to ampicillin, Keflex third generation cephalosporins quinolones and sulfonamides. [ESBL). The patient does not complain of fever or chills excessive pain. She does say that the lower surgical wound has been draining what looks to her like pus. She has been using triple antibiotic cream. 12/31/15 superior wound actually looks somewhat better. Inferiorly still a wound with some depth greater than 2 cm. Using Aquacel Ag. Culture I did of the drainage from the inferior wound and this surgical incision last week was negative 01/07/16; superior wound still requires debridement of surface eschar and nonviable subcutaneous tissue. Inferior wound measured at 1.8 cm. What I can see of the tissue here does not look very healthy. Original culture that I did on her presentation was negative however 01/27/16; the superior wound is closed she still has a deep probing wound of roughly 2 cm inferiorly. There is no overt infection no surrounding erythema. The patient tells me that 2 weeks ago she was developing chest pain and underwent a nuclear stress test. This was apparently negative however she developed chest pain shortly thereafter and had to go have a  cardiac  catheterization that did not show blockages. Since then she is not having chest pain but has unrelenting nausea making it difficult for her to eat. We reviewed her medications and she is on aspirin and Plavix however she apparently has a stent in place as well. 02/03/16; still using silver alginate. Depth of this is down from 1.8 cm the circumference is smaller and the depth down by 2 mm red no drainage and no surrounding infection Electronic Signature(s) BRIAN, KOCOUREK ([id number]) Signed: 02/03/2016 4:44:13 PM By: [name] MD Entered By: [name] on 02/03/2016 09:33:27 [name], [name] ([id number]) -------------------------------------------------------------------------------- Physical Exam Details Patient Name: [name], [name] Date of Service: 02/03/2016 9:15 AM Medical Record Patient Account Number: [id number] Number: Treating RN: Montey Hora 12/03/1940 (76 y.o. Other Clinician: Date of Birth/Sex: Female) Treating Jonise Weightman Primary Care Provider: Lavera [name] Provider/Extender: G Referring Provider: [name] in Treatment: 5 Constitutional Sitting or standing Blood Pressure is within target range for patient.. Pulse regular and within target range for patient.Marland Kitchen Respirations regular, non-labored and within target range.. Temperature is normal and within the target range for the patient.. Patient's appearance is neat and clean. Appears in no acute distress. Well nourished and well developed.. Eyes Conjunctivae clear. No discharge.Marland Kitchen Respiratory Respiratory effort is easy and symmetric bilaterally. Rate is normal at rest and on room air.. Cardiovascular Heart rhythm and rate regular, without murmur or gallop.. Gastrointestinal (GI) Abdomen is soft and non-distended without masses or tenderness. Bowel sounds active in all quadrants.. Psychiatric No evidence of depression, anxiety, or agitation. Calm, cooperative, and communicative.  Appropriate interactions and affect.. Notes Wound exam; by my measurements today depth of this is 1.8 cm down from 2 cm last week. What I can see of the walls of this small tunnel may require debridement next week. There is no evidence of surrounding infection Electronic Signature(s) Signed: 02/03/2016 4:44:13 PM By: [name] MD Entered By: [name] on 02/03/2016 09:35:13 Bogen, [name] ([id number]) -------------------------------------------------------------------------------- Physician Orders Details Patient Name: Bartosiewicz, Caral A. Date of Service: 02/03/2016 9:15 AM Medical Record Patient Account Number: [id number] Number: Treating RN: Montey Hora 1940-09-19 (75 y.o. Other Clinician: Date of Birth/Sex: Female) Treating Shyniece Scripter Primary Care Provider: Lavera [name] Provider/Extender: G Referring Provider: [name] in Treatment: 5 Verbal / Phone Orders: Yes Clinician: Montey Hora Read Back and Verified: Yes Diagnosis Coding Wound Cleansing Wound #2 Distal,Midline Chest o Clean wound with Normal Saline. o May Shower, gently pat wound dry prior to applying new dressing. Anesthetic Wound #2 Distal,Midline Chest o Topical Lidocaine 4% cream applied to wound bed prior to debridement Primary Wound Dressing Wound #2 Distal,Midline Chest o Aquacel Ag - rope Secondary Dressing Wound #2 Distal,Midline Chest o Dry Gauze o Other - telfa island Dressing Change Frequency Wound #2 Distal,Midline Chest o Change dressing every day. Follow-up Appointments Wound #2 Distal,Midline Chest o Return Appointment in 1 week. Additional Orders / Instructions Wound #2 Distal,Midline Chest o Increase protein intake. Electronic Signature(s) Signed: 02/03/2016 4:44:13 PM By: [name] MD Cannata, [name] ([id number]) Signed: 02/03/2016 4:54:21 PM By: Montey Hora Entered By: Montey Hora on 02/03/2016 09:27:30 Therrell, [name]  ([id number]) -------------------------------------------------------------------------------- Problem List Details Patient Name: Kirschbaum, Ariell A. Date of Service: 02/03/2016 9:15 AM Medical Record Patient Account Number: [id number] Number: Treating RN: Montey Hora 11/18/1940 (75 y.o. 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 ([id number]) -------------------------------------------------------------------------------- Progress Note Details Patient Name: Barriere, Sacred A. Date of Service: 02/03/2016 9:15 AM Medical Record Patient Account Number: [id number] Number: Treating RN: Montey Hora 1940/01/21 (75 y.o. Other Clinician: Date of Birth/Sex: Female) Treating Kyrielle Urbanski Primary Care Provider: Lavera [name] Provider/Extender: G Referring Provider: [name] in Treatment: 5 Subjective Chief Complaint Information obtained from Patient 12/24/15 patient is here for review of to nonhealing surgical wounds in a CABG scar History of Present Illness (HPI) 12/24/15; Mrs. Beldin is a 76 year old woman who is the mother of one of our other clinic patient's Tammy Rill. He comes [place] today for review a nonhealing surgical CABG wound. The patient underwent CABG o2 on October 31 17 at [facility]. She  apparently had had a myocardial infarction in October but briefly presented with shortness of breath. She underwent surgery which I understand was uneventful. According to the patient her surgical incision never really healed in 2 spots. One is at the inferior recess question drain site, the other more superiorly about an inch from the superior surface of this it incision. She has been followed by her surgeon at [facility] who felt that both areas which he'll according to the patient. She was put on Augmentin 2 weeks ago and she is finished a course of this culture at that time showed Escherichia coli which should've been sensitive. The patient also relates that she has a history of allergy to ampicillin. Besides this the patient has a history of type 2 diabetes on metformin, multiple myeloma. She had a chest x-ray in mid October the did not show any abnormalities. I cannot see that she had any specific views of the sternum however. We have her referral note from her primary physician Dr. Bernie Covey. She was concerned about erythema around the wound and as mentioned the culture here showed Escherichia coli which should have been Augmentin sensitive but was resistant to ampicillin, Keflex third generation cephalosporins quinolones and sulfonamides. [ESBL). The patient does not complain of fever or chills excessive pain. She does say that the lower surgical wound has been draining what looks to her like pus. She has been using triple antibiotic cream. 12/31/15 superior wound actually looks somewhat better. Inferiorly still a wound with some depth greater than 2 cm. Using Aquacel Ag. Culture I did of the drainage from the inferior wound and this surgical incision last week was negative 01/07/16; superior wound still requires debridement of surface eschar and nonviable subcutaneous tissue. Inferior wound measured at 1.8 cm. What I can see of the tissue here does not look very healthy.  Original culture that I did on her presentation was negative however 01/27/16; the superior wound is closed she still has a deep probing wound of roughly 2 cm inferiorly. There is no overt infection no surrounding erythema. The patient tells me that 2 weeks ago she was developing chest pain and underwent a nuclear stress test. This was apparently negative however she developed chest pain shortly thereafter and had to go have a cardiac catheterization that did not show blockages. Since then she is not having chest pain but has  unrelenting nausea making it difficult for her to eat. We reviewed her Charbonnet, Waynette A. ([id number]) medications and she is on aspirin and Plavix however she apparently has a stent in place as well. 02/03/16; still using silver alginate. Depth of this is down from 1.8 cm the circumference is smaller and the depth down by 2 mm red no drainage and no surrounding infection Objective Constitutional Sitting or standing Blood Pressure is within target range for patient.. Pulse regular and within target range for patient.Marland Kitchen Respirations regular, non-labored and within target range.. Temperature is normal and within the target range for the patient.. Patient's appearance is neat and clean. Appears in no acute distress. Well nourished and well developed.. Vitals Time Taken: 9:15 AM, Height: 62 in, Weight: 161 lbs, BMI: 29.4, Temperature: 98.0 F, Pulse: 58 bpm, Respiratory Rate: 18 breaths/min, Blood Pressure: 133/45 mmHg. Eyes Conjunctivae clear. No discharge.Marland Kitchen Respiratory Respiratory effort is easy and symmetric bilaterally. Rate is normal at rest and on room air.. Cardiovascular Heart rhythm and rate regular, without murmur or gallop.. Gastrointestinal (GI) Abdomen is soft and non-distended without masses or tenderness. Bowel sounds active in all quadrants.. Psychiatric No evidence of depression, anxiety, or agitation. Calm, cooperative, and communicative.  Appropriate interactions and affect.. General Notes: Wound exam; by my measurements today depth of this is 1.8 cm down from 2 cm last week. What I can see of the walls of this small tunnel may require debridement next week. There is no evidence of surrounding infection Integumentary (Hair, Skin) Wound #2 status is Open. Original cause of wound was Surgical Injury. The wound is located on the Distal,Midline Chest. The wound measures 0.6cm length x 0.4cm width x 1.5cm depth; 0.188cm^2 area and 0.283cm^3 volume. The wound is limited to skin breakdown. There is no tunneling or undermining noted. There is a large amount of serosanguineous drainage noted. The wound margin is flat and intact. There is large (67-100%) red granulation within the wound bed. There is no necrotic tissue within the wound bed. The periwound skin appearance did not exhibit: Callus, Crepitus, Excoriation, Induration, Rash, Scarring, Dry/Scaly, Maceration, Atrophie Blanche, Cyanosis, Ecchymosis, Hemosiderin Staining, Mottled, Pallor, Stoltz, Sister A. ([id number]) Rubor, Erythema. Periwound temperature was noted as No Abnormality. 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 May Shower, gently pat wound dry prior to applying new dressing. Anesthetic: Wound #2 Distal,Midline Chest: Topical Lidocaine 4% cream applied to wound bed prior to debridement Primary Wound Dressing: Wound #2 Distal,Midline Chest: Aquacel Ag - rope Secondary Dressing: Wound #2 Distal,Midline Chest: Dry Gauze Other - telfa island Dressing Change Frequency: Wound #2 Distal,Midline Chest: Change dressing every day. Follow-up Appointments: Wound #2  Distal,Midline Chest: Return Appointment in 1 week. Additional Orders / Instructions: Wound #2 Distal,Midline Chest: Increase protein intake. Brinegar, Ubah A. ([id number]) #1 going to continue with silver alginate for another week. As long as the depth of this is getting smaller I'm likely to continue the same dressing. Possible debridement of the walls of the small tunnel may be necessary. #2 I suspect this was secondary to a postsurgical wound infection, there is no evidence of ongoing infection currently and no need for current antibiotics. Electronic Signature(s) Signed: 02/03/2016 4:44:13 PM By: [name] MD Entered By: [name] on 02/03/2016 09:36:35 Longley, Dortha A. ([id number]) --------------------------------------------------------------------------------  SuperBill Details Patient Name: Soule, Cyan A. Date of Service: 02/03/2016 Medical Record Patient Account Number: [id number] Number: Treating RN: Montey Hora Jul 30, 1940 (75 y.o. 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

## 2016-02-04 NOTE — Progress Notes (Signed)
[name], [name] ([id number]) Visit Report for 02/03/2016 Arrival Information Details Patient Name: [name], [name] Date of Service: 02/03/2016 9:15 AM Medical Record Patient Account Number: [id number] Number: Treating RN: [name] 03-Jul-1940 (75 y.o. Other Clinician: Date of Birth/Sex: Female) Treating [name] Primary Care Alexsys Eskin: [name] Brixton Schnapp/Extender: G Referring Fallen Crisostomo: [name] in Treatment: 5 Visit Information History Since Last Visit Added or deleted any medications: No Patient Arrived: Walker Any new allergies or adverse reactions: No Arrival Time: 09:14 Had a fall or experienced change in No Accompanied By: self activities of daily living that may affect Transfer Assistance: None risk of falls: Patient Identification Verified: Yes Signs or symptoms of abuse/neglect since last No Secondary Verification Process Completed: Yes visito Patient Requires Transmission-Based No Hospitalized since last visit: No Precautions: Has Dressing in Place as Prescribed: Yes Patient Has Alerts: Yes Pain Present Now: No Patient Alerts: DMII Electronic Signature(s) Signed: 02/03/2016 4:54:21 PM By: [name] Entered By: [name] on 02/03/2016 09:14:46 [name], [name] ([id number]) -------------------------------------------------------------------------------- Clinic Level of Care Assessment Details Patient Name: [name], [name] Date of Service: 02/03/2016 9:15 AM Medical Record Patient Account Number: [id number] Number: Treating RN: [name] [birth year] (75 y.o. Other Clinician: Date of Birth/Sex: Female) Treating ROBSON, [place] Primary Care [name]: [name] Karmon Andis/Extender: G Referring Cherity Blickenstaff: [name] in Treatment: 5 Clinic Level of Care Assessment Items TOOL 4 Quantity Score []  - Use when only an EandM is performed on FOLLOW-UP visit 0 ASSESSMENTS - Nursing Assessment / Reassessment X -  Reassessment of Co-morbidities (includes updates in patient status) 1 10 X - Reassessment of Adherence to Treatment Plan 1 5 ASSESSMENTS - Wound and Skin Assessment / Reassessment X - Simple Wound Assessment / Reassessment - one wound 1 5 []  - Complex Wound Assessment / Reassessment - multiple wounds 0 []  - Dermatologic / Skin Assessment (not related to wound area) 0 ASSESSMENTS - Focused Assessment []  - Circumferential Edema Measurements - multi extremities 0 []  - Nutritional Assessment / Counseling / Intervention 0 []  - Lower Extremity Assessment (monofilament, tuning fork, pulses) 0 []  - Peripheral Arterial Disease Assessment (using hand held doppler) 0 ASSESSMENTS - Ostomy and/or Continence Assessment and Care []  - Incontinence Assessment and Management 0 []  - Ostomy Care Assessment and Management (repouching, etc.) 0 PROCESS - Coordination of Care X - Simple Patient / Family Education for ongoing care 1 15 []  - Complex (extensive) Patient / Family Education for ongoing care 0 []  - Staff obtains Programmer, systems, Records, Test Results / Process Orders 0 []  - Staff telephones HHA, Nursing Homes / Clarify orders / etc 0 [name], [name] ([id number]) []  - Routine Transfer to another Facility (non-emergent condition) 0 []  - Routine Hospital Admission (non-emergent condition) 0 []  - New Admissions / Biomedical engineer / Ordering NPWT, Apligraf, etc. 0 []  - Emergency Hospital Admission (emergent condition) 0 X - Simple Discharge Coordination 1 10 []  - Complex (extensive) Discharge Coordination 0 PROCESS - Special Needs []  - Pediatric / Minor Patient Management 0 []  - Isolation Patient Management 0 []  - Hearing / Language / Visual special needs 0 []  - Assessment of Community assistance (transportation, D/C planning, etc.) 0 []  - Additional assistance / Altered mentation 0 []  - Support Surface(s) Assessment (bed, cushion, seat, etc.) 0 INTERVENTIONS - Wound Cleansing / Measurement X - Simple  Wound Cleansing - one wound 1 5 []  - Complex Wound Cleansing - multiple wounds 0 X - Wound Imaging (photographs - any number of wounds)  1 5 []  - Wound Tracing (instead of photographs) 0 X - Simple Wound Measurement - one wound 1 5 []  - Complex Wound Measurement - multiple wounds 0 INTERVENTIONS - Wound Dressings X - Small Wound Dressing one or multiple wounds 1 10 []  - Medium Wound Dressing one or multiple wounds 0 []  - Large Wound Dressing one or multiple wounds 0 []  - Application of Medications - topical 0 []  - Application of Medications - injection 0 Height, [name] ([id number]) INTERVENTIONS - Miscellaneous []  - External ear exam 0 []  - Specimen Collection (cultures, biopsies, blood, body fluids, etc.) 0 []  - Specimen(s) / Culture(s) sent or taken to Lab for analysis 0 []  - Patient Transfer (multiple staff / Harrel Lemon Lift / Similar devices) 0 []  - Simple Staple / Suture removal (25 or less) 0 []  - Complex Staple / Suture removal (26 or more) 0 []  - Hypo / Hyperglycemic Management (close monitor of Blood Glucose) 0 []  - Ankle / Brachial Index (ABI) - do not check if billed separately 0 X - Vital Signs 1 5 Has the patient been seen at the hospital within the last three years: Yes Total Score: 75 Level Of Care: New/Established - Level 2 Electronic Signature(s) Signed: 02/03/2016 4:54:21 PM By: [name] Entered By: [name] on 02/03/2016 09:28:02 [name], [name] ([id number]) -------------------------------------------------------------------------------- Encounter Discharge Information Details Patient Name: [name], [name] Date of Service: 02/03/2016 9:15 AM Medical Record Patient Account Number: [id number] Number: Treating RN: [name] 09-27-1940 (75 y.o. Other Clinician: Date of Birth/Sex: Female) Treating [name] Primary Care Jerrold Haskell: [name] Alaisha Eversley/Extender: G Referring Adisson Deak: [name] in Treatment: 5 Encounter Discharge  Information Items Discharge Pain Level: 0 Discharge Condition: Stable Ambulatory Status: Walker Discharge Destination: Home Private Transportation: Auto Accompanied By: self Schedule Follow-up Appointment: Yes Medication Reconciliation completed and No provided to Patient/Care Vir Whet[place]ine: Clinical Summary of Care: Provided Form Type Recipient Paper Patient AW Electronic Signature(s) Signed: 02/03/2016 9:36:20 AM By: Ruthine Dose Entered By: Ruthine Dose on 02/03/2016 09:36:20 [name], [name] ([id number]) -------------------------------------------------------------------------------- Multi Wound Chart Details Patient Name: [name], [name] Date of Service: 02/03/2016 9:15 AM Medical Record Patient Account Number: [id number] Number: Treating RN: [name] 06/24/1940 (75 y.o. Other Clinician: Date of Birth/Sex: Female) Treating [name] Primary Care Shia Delaine: [name] Crit Obremski/Extender: G Referring Krystopher Kuenzel: [name] in Treatment: 5 Vital Signs Height(in): 62 Pulse(bpm): 58 Weight(lbs): 161 Blood Pressure 133/45 (mmHg): Body Mass Index(BMI): 29 Temperature(F): 98.0 Respiratory Rate 18 (breaths/min): Photos: [N/A:N/A] Wound Location: Chest - Midline, Distal N/A N/A Wounding Event: Surgical Injury N/A N/A Primary Etiology: Open Surgical Wound N/A N/A Comorbid History: Cataracts, Anemia, N/A N/A Congestive Heart Failure, Coronary Artery Disease, Hypertension, Type II Diabetes, Osteoarthritis, Neuropathy, Received Chemotherapy Date Acquired: 11/11/2015 N/A N/A Weeks of Treatment: 4 N/A N/A Wound Status: Open N/A N/A Measurements L x W x D 0.6x0.4x1.5 N/A N/A (cm) Area (cm) : 0.188 N/A N/A Volume (cm) : 0.283 N/A N/A % Reduction in Area: 31.60% N/A N/A % Reduction in Volume: 51.00% N/A N/A Classification: N/A N/A Prisco, Tayler A. ([id number]) Full Thickness Without Exposed Support Structures Exudate Amount: Large N/A  N/A Exudate Type: Serosanguineous N/A N/A Exudate Color: red, brown N/A N/A Wound Margin: Flat and Intact N/A N/A Granulation Amount: Large (67-100%) N/A N/A Granulation Quality: Red N/A N/A Necrotic Amount: None Present (0%) N/A N/A Exposed Structures: Fascia: No N/A N/A Fat Layer (Subcutaneous Tissue) Exposed: No Tendon: No Muscle: No Joint: No Bone: No Limited  to Skin Breakdown Epithelialization: None N/A N/A Periwound Skin Texture: Excoriation: No N/A N/A Induration: No Callus: No Crepitus: No Rash: No Scarring: No Periwound Skin Maceration: No N/A N/A Moisture: Dry/Scaly: No Periwound Skin Color: Atrophie Blanche: No N/A N/A Cyanosis: No Ecchymosis: No Erythema: No Hemosiderin Staining: No Mottled: No Pallor: No Rubor: No Temperature: No Abnormality N/A N/A Tenderness on No N/A N/A Palpation: Wound Preparation: Ulcer Cleansing: N/A N/A Rinsed/Irrigated with Saline Topical Anesthetic Applied: Other: lidocaine 4% Treatment Notes Wound #2 (Distal, Midline Chest) [name], [name] ([id number]) 1. Cleansed with: Clean wound with Normal Saline 2. Anesthetic Topical Lidocaine 4% cream to wound bed prior to debridement 4. Dressing Applied: Aquacel Ag 5. Secondary Dressing Applied Dry [place] Signature(s) Signed: 02/03/2016 4:44:13 PM By: [name] MD Entered By: [name] on 02/03/2016 09:32:26 Traum, [name] ([id number]) -------------------------------------------------------------------------------- Multi-Disciplinary Care Plan Details Patient Name: [name], [name] Date of Service: 02/03/2016 9:15 AM Medical Record Patient Account Number: [id number] Number: Treating RN: [name] 05/22/1940 (75 y.o. Other Clinician: Date of Birth/Sex: Female) Treating [name] Primary Care Kyira Volkert: [name] Shali Vesey/Extender: G Referring Stephenia Vogan: [name] in Treatment: 5 Active Inactive ` Abuse /  Safety / Falls / Self Care Management Nursing Diagnoses: Impaired physical mobility Potential for falls Goals: Patient will remain injury free Date Initiated: 12/24/2015 Target Resolution Date: 02/10/2016 Goal Status: Active Interventions: Assess fall risk on admission and as needed Notes: ` Orientation to the Wound Care Program Nursing Diagnoses: Knowledge deficit related to the wound healing center program Goals: Patient/caregiver will verbalize understanding of the [place] Date Initiated: 12/24/2015 Target Resolution Date: 02/10/2016 Goal Status: Active Interventions: Provide education on orientation to the wound center Notes: ` Wound/Skin Impairment [name], [name] ([id number]) Nursing Diagnoses: Impaired tissue integrity Goals: Patient/caregiver will verbalize understanding of skin care regimen Date Initiated: 12/24/2015 Target Resolution Date: 02/10/2016 Goal Status: Active Ulcer/skin breakdown will have a volume reduction of 30% by week 4 Date Initiated: 12/24/2015 Target Resolution Date: 02/10/2016 Goal Status: Active Ulcer/skin breakdown will have a volume reduction of 50% by week 8 Date Initiated: 12/24/2015 Target Resolution Date: 02/24/2016 Goal Status: Active Ulcer/skin breakdown will have a volume reduction of 80% by week 12 Date Initiated: 12/24/2015 Target Resolution Date: 03/23/2016 Goal Status: Active Ulcer/skin breakdown will heal within 14 weeks Date Initiated: 12/24/2015 Target Resolution Date: 04/06/2016 Goal Status: Active Interventions: Assess patient/caregiver ability to obtain necessary supplies Assess patient/caregiver ability to perform ulcer/skin care regimen upon admission and as needed Assess ulceration(s) every visit Notes: Electronic Signature(s) Signed: 02/03/2016 4:54:21 PM By: [name] Entered By: [name] on 02/03/2016 09:26:53 [name], [name]  ([id number]) -------------------------------------------------------------------------------- Pain Assessment Details Patient Name: [name], [name] Date of Service: 02/03/2016 9:15 AM Medical Record Patient Account Number: [id number] Number: Treating RN: [name] Apr 24, 1940 (75 y.o. Other Clinician: Date of Birth/Sex: Female) Treating [name] Primary Care Gerldine Suleiman: [name] Chalene Treu/Extender: G Referring Crews Mccollam: [name] in Treatment: 5 Active Problems Location of Pain Severity and Description of Pain Patient Has Paino No Site Locations Pain Management and Medication Current Pain Management: Notes Topical or injectable lidocaine is offered to patient for acute pain when surgical debridement is performed. If needed, Patient is instructed to use over the counter pain medication for the following 24-48 hours after debridement. Wound care MDs do not prescribed pain medications. Patient has chronic pain or uncontrolled pain. Patient has been instructed to make an appointment with their Primary Care Physician  for pain management. Electronic Signature(s) Signed: 02/03/2016 4:54:21 PM By: [name] Entered By: [name] on 02/03/2016 09:14:54 Nicklaus, [name] ([id number]) -------------------------------------------------------------------------------- Patient/Caregiver Education Details Patient Name: [name], [name] Date of Service: 02/03/2016 9:15 AM Medical Record Patient Account Number: [id number] Number: Treating RN: [name] 12-Sep-1940 (75 y.o. Other Clinician: Date of Birth/Gender: Female) Treating [name] Primary Care Physician: [name] Physician/Extender: G Referring Physician: [name] in Treatment: 5 Education Assessment Education Provided To: Patient Education Topics Provided Wound/Skin Impairment: Handouts: Other: wound care to continue as ordered Methods: Demonstration,  Explain/Verbal Responses: State content correctly Electronic Signature(s) Signed: 02/03/2016 4:54:21 PM By: [name] Entered By: [name] on 02/03/2016 09:28:58 [name], [name] ([id number]) -------------------------------------------------------------------------------- Wound Assessment Details Patient Name: [name], [name] Date of Service: 02/03/2016 9:15 AM Medical Record Patient Account Number: [id number] Number: Treating RN: [name] 11-Mar-1940 (75 y.o. Other Clinician: Date of Birth/Sex: Female) Treating [name] Primary Care Jettson Crable: [name] Torria Fromer/Extender: G Referring Mykal Batiz: [name] in Treatment: 5 Wound Status Wound Number: 2 Primary Open Surgical Wound Etiology: Wound Location: Chest - Midline, Distal Wound Open Wounding Event: Surgical Injury Status: Date Acquired: 11/11/2015 Comorbid Cataracts, Anemia, Congestive Heart Weeks Of Treatment: 4 History: Failure, Coronary Artery Disease, Clustered Wound: No Hypertension, Type II Diabetes, Osteoarthritis, Neuropathy, Received Chemotherapy Photos Wound Measurements Length: (cm) 0.6 Width: (cm) 0.4 Depth: (cm) 1.5 Area: (cm) 0.188 Volume: (cm) 0.283 % Reduction in Area: 31.6% % Reduction in Volume: 51% Epithelialization: None Tunneling: No Undermining: No Wound Description Full Thickness Without Exposed Foul Odor After C Classification: Support Structures Wound Margin: Flat and Intact Exudate Large Amount: Exudate Type: Serosanguineous Exudate Color: red, brown Ciullo, Armida A. ([id number]) leansing: No Wound Bed Granulation Amount: Large (67-100%) Exposed Structure Granulation Quality: Red Fascia Exposed: No Necrotic Amount: None Present (0%) Fat Layer (Subcutaneous Tissue) Exposed: No Tendon Exposed: No Muscle Exposed: No Joint Exposed: No Bone Exposed: No Limited to Skin Breakdown Periwound Skin Texture Texture Color No Abnormalities  Noted: No No Abnormalities Noted: No Callus: No Atrophie Blanche: No Crepitus: No Cyanosis: No Excoriation: No Ecchymosis: No Induration: No Erythema: No Rash: No Hemosiderin Staining: No Scarring: No Mottled: No Pallor: No Moisture Rubor: No No Abnormalities Noted: No Dry / Scaly: No Temperature / Pain Maceration: No Temperature: No Abnormality Wound Preparation Ulcer Cleansing: Rinsed/Irrigated with Saline Topical Anesthetic Applied: Other: lidocaine 4%, Treatment Notes Wound #2 (Distal, Midline Chest) 1. Cleansed with: Clean wound with Normal Saline 2. Anesthetic Topical Lidocaine 4% cream to wound bed prior to debridement 4. Dressing Applied: Aquacel Ag 5. Secondary Dressing Applied Dry [place] Signature(s) Signed: 02/03/2016 4:54:21 PM By: [name] Entered By: [name] on 02/03/2016 09:25:40 Thomason, Jenisa AMarland Kitchen ([id number]) -------------------------------------------------------------------------------- [place] Details Patient Name: Kopp, Jacelyn A. Date of Service: 02/03/2016 9:15 AM Medical Record Patient Account Number: [id number] Number: Treating RN: [name] [birth year] (75 y.o. Other Clinician: Date of Birth/Sex: Female) Treating [name] Primary Care Cassara Nida: [name] Niko Jakel/Extender: G Referring Ashraf Mesta: [name] in Treatment: 5 Vital Signs Time Taken: 09:15 Temperature (F): 98.0 Height (in): 62 Pulse (bpm): 58 Weight (lbs): 161 Respiratory Rate (breaths/min): 18 Body Mass Index (BMI): 29.4 Blood Pressure (mmHg): 133/45 Reference Range: 80 - 120 mg / dl Electronic Signature(s) Signed: 02/03/2016 4:54:21 PM By: [name] Entered By: [name] on 02/03/2016 09:15:30

## 2016-02-05 ENCOUNTER — Ambulatory Visit

## 2016-02-10 ENCOUNTER — Encounter: Payer: Medicare Other | Admitting: Internal Medicine

## 2016-02-10 ENCOUNTER — Ambulatory Visit

## 2016-02-10 ENCOUNTER — Ambulatory Visit
Admission: RE | Admit: 2016-02-10 | Discharge: 2016-02-10 | Disposition: A | Discharge status: Home / Self Care | Payer: Medicare Other | Source: Ambulatory Visit | Attending: Family Medicine | Admitting: Family Medicine

## 2016-02-10 ENCOUNTER — Ambulatory Visit: Payer: Medicare Other

## 2016-02-10 DIAGNOSIS — R112 Nausea with vomiting, unspecified: Secondary | ICD-10-CM | POA: Diagnosis present

## 2016-02-10 DIAGNOSIS — R1031 Right lower quadrant pain: Secondary | ICD-10-CM | POA: Diagnosis present

## 2016-02-10 DIAGNOSIS — R1011 Right upper quadrant pain: Secondary | ICD-10-CM | POA: Insufficient documentation

## 2016-02-10 DIAGNOSIS — L98498 Non-pressure chronic ulcer of skin of other sites with other specified severity: Secondary | ICD-10-CM | POA: Diagnosis not present

## 2016-02-11 NOTE — Progress Notes (Signed)
FELISE, [name] ([id number]) Visit Report for 02/10/2016 Chief Complaint Document Details Patient Name: [name], [name] Date of Service: 02/10/2016 11:00 AM Medical Record Patient Account Number: [id number] Number: Treating RN: [name] 1940/03/30 (76 y.o. Other Clinician: Date of Birth/Sex: Female) Treating [name] Primary Care Provider: Lavera [name] Provider/Extender: G Referring Provider: Sallee [name] in Treatment: 6 Information Obtained from: Patient Chief Complaint 12/24/15 patient is here for review of to nonhealing surgical wounds in a CABG scar Electronic Signature(s) Signed: 02/10/2016 5:54:02 PM By: [name] Ham MD Entered By: [name] on 02/10/2016 11:43:00 Even, [place]. ([id number]) -------------------------------------------------------------------------------- HPI Details Patient Name: [name], [name] Date of Service: 02/10/2016 11:00 AM Medical Record Patient Account Number: [id number] Number: Treating RN: [name] 04/22/1940 (76 y.o. Other Clinician: Date of Birth/Sex: Female) Treating [name] Primary Care Provider: Lavera [name] Provider/Extender: G Referring Provider: Sallee [name] in Treatment: 6 History of Present Illness HPI Description: 12/24/15; [name] is a 76 year old woman who is the mother of one of our other clinic patient's [name] He comes [place] today for review a nonhealing surgical CABG wound. The patient underwent CABG o2 on October 31 17 at [facility]. She apparently had had a myocardial infarction in October but briefly presented with shortness of breath. She underwent surgery which I understand was uneventful. According to the patient her surgical incision never really healed in 2 spots. One is at the inferior recess question drain site, the other more superiorly about an inch from the superior surface of this it incision. She has been followed by her surgeon at  [facility] who felt that both areas which he'll according to the patient. She was put on Augmentin 2 weeks ago and she is finished a course of this culture at that time showed Escherichia coli which should've been sensitive. The patient also relates that she has a history of allergy to ampicillin. Besides this the patient has a history of type 2 diabetes on metformin, multiple myeloma. She had a chest x-ray in mid October the did not show any abnormalities. I cannot see that she had any specific views of the sternum however. We have her referral note from her primary physician Dr. Bernie [name]. She was concerned about erythema around the wound and as mentioned the culture here showed Escherichia coli which should have been Augmentin sensitive but was resistant to ampicillin, Keflex third generation cephalosporins quinolones and sulfonamides. [ESBL). The patient does not complain of fever or chills excessive pain. She does say that the lower surgical wound has been draining what looks to her like pus. She has been using triple antibiotic cream. 12/31/15 superior wound actually looks somewhat better. Inferiorly still a wound with some depth greater than 2 cm. Using Aquacel Ag. Culture I did of the drainage from the inferior wound and this surgical incision last week was negative 01/07/16; superior wound still requires debridement of surface eschar and nonviable subcutaneous tissue. Inferior wound measured at 1.8 cm. What I can see of the tissue here does not look very healthy. Original culture that I did on her presentation was negative however 01/27/16; the superior wound is closed she still has a deep probing wound of roughly 2 cm inferiorly. There is no overt infection no surrounding erythema. The patient tells me that 2 weeks ago she was developing chest pain and underwent a nuclear stress test. This was apparently negative however she developed chest pain shortly thereafter and had to go have a  cardiac  catheterization that did not show blockages. Since then she is not having chest pain but has unrelenting nausea making it difficult for her to eat. We reviewed her medications and she is on aspirin and Plavix however she apparently has a stent in place as well. 02/03/16; still using silver alginate. Depth of this is down from 1.8 cm the circumference is smaller and the depth down by 2 mm red no drainage and no surrounding infection 02/10/16; still using silver alginate depth down to 1.3 cm today measured by myself. Appears to be making slow progress. There is no surrounding tenderness.. The patient states she has unrelenting nausea which is [name], [name] ([id number]) nonexertional. Not related to meals. She has had no vomiting. She also tells me she is had previous lap band surgery but does not give clear reflux type symptoms Electronic Signature(s) Signed: 02/10/2016 5:54:02 PM By: [name] Ham MD Entered By: [name] on 02/10/2016 11:44:39 [name], [name] ([id number]) -------------------------------------------------------------------------------- Physical Exam Details Patient Name: [name], [name] Date of Service: 02/10/2016 11:00 AM Medical Record Patient Account Number: [id number] Number: Treating RN: [name] 1940/08/04 (76 y.o. Other Clinician: Date of Birth/Sex: Female) Treating [name] Primary Care Provider: Lavera [name] Provider/Extender: G Referring Provider: Sallee [name] in Treatment: 6 Constitutional Sitting or standing Blood Pressure is within target range for patient.. Pulse regular and within target range for patient.Marland Kitchen Respirations regular, non-labored and within target range.. Temperature is normal and within the target range for the patient.. Patient's appearance is neat and clean. Appears in no acute distress. Well nourished and well developed.Marland Kitchen Respiratory Respiratory effort is easy and symmetric bilaterally. Rate is normal  at rest and on room air.. Bilateral breath sounds are clear and equal in all lobes with no wheezes, rales or rhonchi.. Cardiovascular Heart sounds are normal there is no murmurs no gallops no increase in jugular venous pressure episodic gurgling gastric sounds up into the chest. Gastrointestinal (GI) Abdomen is soft and non-distended without masses or tenderness. Bowel sounds active in all quadrants.. Psychiatric No evidence of depression, anxiety, or agitation. Calm, cooperative, and communicative. Appropriate interactions and affect.. Notes Wound exam; by my measurements down to 1.3 cm, our intake the risk of the same thing. There is no surrounding tenderness. No erythema. Electronic Signature(s) Signed: 02/10/2016 5:54:02 PM By: [name] Ham MD Entered By: [name] on 02/10/2016 11:45:55 [name], [name] ([id number]) -------------------------------------------------------------------------------- Physician Orders Details Patient Name: [name], [name] Date of Service: 02/10/2016 11:00 AM Medical Record Patient Account Number: [id number] Number: Treating RN: [name] 12/03/1940 (75 y.o. Other Clinician: Date of Birth/Sex: Female) Treating Britnay Magnussen Primary Care Provider: Lavera [name] Provider/Extender: G Referring Provider: Sallee [name] in Treatment: 6 Verbal / Phone Orders: Yes Clinician: Montey [name] Read Back and Verified: Yes Diagnosis Coding Wound Cleansing Wound #2 Distal,Midline Chest o Clean wound with Normal Saline. o May Shower, gently pat wound dry prior to applying new dressing. Anesthetic Wound #2 Distal,Midline Chest o Topical Lidocaine 4% cream applied to wound bed prior to debridement Primary Wound Dressing Wound #2 Distal,Midline Chest o Iodoform packing Gauze Secondary Dressing Wound #2 Distal,Midline Chest o Dry Gauze o Other - telfa island Dressing Change Frequency Wound #2 Distal,Midline Chest o  Change dressing every day. Follow-up Appointments Wound #2 Distal,Midline Chest o Return Appointment in 2 weeks. Additional Orders / Instructions Wound #2 Distal,Midline Chest o Increase protein intake. Electronic Signature(s) Signed: 02/10/2016 5:15:42 PM By: Maxwell Marion, [name] ([id number]) Signed: 02/10/2016 5:54:02 PM  By: [name] Ham MD Entered By: [name] on 02/10/2016 11:33:07 Gosling, [name] ([id number]) -------------------------------------------------------------------------------- Problem List Details Patient Name: [name], [name] Date of Service: 02/10/2016 11:00 AM Medical Record Patient Account Number: [id number] Number: Treating RN: [name] 17-Apr-1940 (75 y.o. 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 ([id number]) -------------------------------------------------------------------------------- Progress Note Details Patient Name: [name], [name] Date of Service: 02/10/2016 11:00 AM Medical Record Patient Account Number: [id number] Number: Treating RN: [name] 1940/05/24 (75 y.o. Other Clinician: Date of Birth/Sex: Female) Treating Shaley Leavens Primary Care Provider: Lavera [name] Provider/Extender: G Referring Provider: Sallee [name] in Treatment:  6 Subjective Chief Complaint Information obtained from Patient 12/24/15 patient is here for review of to nonhealing surgical wounds in a CABG scar History of Present Illness (HPI) 12/24/15; Mrs. Geron is a 76 year old woman who is the mother of one of our other clinic patient's [name] Notte. He comes [place] today for review a nonhealing surgical CABG wound. The patient underwent CABG o2 on October 31 17 at [facility]. She apparently had had a myocardial infarction in October but briefly presented with shortness of breath. She underwent surgery which I understand was uneventful. According to the patient her surgical incision never really healed in 2 spots. One is at the inferior recess question drain site, the other more superiorly about an inch from the superior surface of this it incision. She has been followed by her surgeon at [facility] who felt that both areas which he'll according to the patient. She was put on Augmentin 2 weeks ago and she is finished a course of this culture at that time showed Escherichia coli which should've been sensitive. The patient also relates that she has a history of allergy to ampicillin. Besides this the patient has a history of type 2 diabetes on metformin, multiple myeloma. She had a chest x-ray in mid October the did not show any abnormalities. I cannot see that she had any specific views of the sternum however. We have her referral note from her primary physician Dr. Bernie [name]. She was concerned about erythema around the wound and as mentioned the culture here showed Escherichia coli which should have been Augmentin sensitive but was resistant to ampicillin, Keflex third generation cephalosporins quinolones and sulfonamides. [ESBL). The patient does not complain of fever or chills excessive pain. She does say that the lower surgical wound has been draining what looks to her like pus. She has been using triple antibiotic cream. 12/31/15  superior wound actually looks somewhat better. Inferiorly still a wound with some depth greater than 2 cm. Using Aquacel Ag. Culture I did of the drainage from the inferior wound and this surgical incision last week was negative 01/07/16; superior wound still requires debridement of surface eschar and nonviable subcutaneous tissue. Inferior wound measured at 1.8 cm. What I can see of the tissue here does not look very healthy. Original culture that I did on her presentation was negative however 01/27/16; the superior wound is closed she still  has a deep probing wound of roughly 2 cm inferiorly. There is no overt infection no surrounding erythema. The patient tells me that 2 weeks ago she was developing chest pain and underwent a nuclear stress test. This was apparently negative however she developed chest pain shortly thereafter and had to go have a cardiac catheterization that did not show blockages. Since then she is not having chest pain but has unrelenting nausea making it difficult for her to eat. We reviewed her [name], [name] ([id number]) medications and she is on aspirin and Plavix however she apparently has a stent in place as well. 02/03/16; still using silver alginate. Depth of this is down from 1.8 cm the circumference is smaller and the depth down by 2 mm red no drainage and no surrounding infection 02/10/16; still using silver alginate depth down to 1.3 cm today measured by myself. Appears to be making slow progress. There is no surrounding tenderness.. The patient states she has unrelenting nausea which is nonexertional. Not related to meals. She has had no vomiting. She also tells me she is had previous lap band surgery but does not give clear reflux type symptoms Objective Constitutional Sitting or standing Blood Pressure is within target range for patient.. Pulse regular and within target range for patient.Marland Kitchen Respirations regular, non-labored and within target range.. Temperature  is normal and within the target range for the patient.. Patient's appearance is neat and clean. Appears in no acute distress. Well nourished and well developed.. Vitals Time Taken: 11:06 AM, Height: 62 in, Weight: 161 lbs, BMI: 29.4, Temperature: 98.1 F, Pulse: 74 bpm, Respiratory Rate: 18 breaths/min, Blood Pressure: 119/41 mmHg. Respiratory Respiratory effort is easy and symmetric bilaterally. Rate is normal at rest and on room air.. Bilateral breath sounds are clear and equal in all lobes with no wheezes, rales or rhonchi.. Cardiovascular Heart sounds are normal there is no murmurs no gallops no increase in jugular venous pressure episodic gurgling gastric sounds up into the chest. Gastrointestinal (GI) Abdomen is soft and non-distended without masses or tenderness. Bowel sounds active in all quadrants.. Psychiatric No evidence of depression, anxiety, or agitation. Calm, cooperative, and communicative. Appropriate interactions and affect.. General Notes: Wound exam; by my measurements down to 1.3 cm, our intake the risk of the same thing. There is no surrounding tenderness. No erythema. Integumentary (Hair, Skin) Wound #2 status is Open. Original cause of wound was Surgical Injury. The wound is located on the Distal,Midline Chest. The wound measures 0.5cm length x 0.4cm width x 1.2cm depth; 0.157cm^2 area and 0.188cm^3 volume. The wound is limited to skin breakdown. There is no tunneling or undermining noted. There is a large amount of serosanguineous drainage noted. The wound margin is flat and intact. There is large (67-100%) red granulation within the wound bed. There is no necrotic tissue within the wound bed. [name], [name] ([id number]) The periwound skin appearance did not exhibit: Callus, Crepitus, Excoriation, Induration, Rash, Scarring, Dry/Scaly, Maceration, Atrophie Blanche, Cyanosis, Ecchymosis, Hemosiderin Staining, Mottled, Pallor, Rubor, Erythema. Periwound temperature  was noted as No Abnormality. 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 May Shower, gently pat wound dry prior to applying new dressing. Anesthetic: Wound #2 Distal,Midline Chest: Topical Lidocaine 4% cream applied to wound bed prior to debridement Primary Wound  Dressing: Wound #2 Distal,Midline Chest: Iodoform packing Gauze Secondary Dressing: Wound #2 Distal,Midline Chest: Dry Gauze Other - telfa island Dressing Change Frequency: Wound #2 Distal,Midline Chest: Change dressing every day. Follow-up Appointments: Wound #2 Distal,Midline Chest: Return Appointment in 2 weeks. Additional Orders / Instructions: Wound #2 Distal,Midline Chest: Increase protein intake. [name], [name] ([id number]) #1 we change the dressing to iodoform packing today to see if we can stimulate some more aggressive healing. The circumference of the opening here is small but there is still 1.3 cm of depth. There is no evidence of surrounding infection. #2 the patient talks about episodes of nausea without vomiting. She apparently had an ultrasound done today in [place] to check her gallbladder. Cardiac exam is normal. I wonder about reflux related to her previous banding. I'm not sure who is following this. Electronic Signature(s) Signed: 02/10/2016 5:54:02 PM By: [name] Ham MD Entered By: [name] on 02/10/2016 11:48:50 [place], [place] ([id number]) -------------------------------------------------------------------------------- SuperBill Details Patient Name: Riel, Amrit A. Date of Service: 02/10/2016 Medical Record Patient Account Number: [id number] Number: Treating RN: [name] Dec 02, 1940 (75 y.o. 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

## 2016-02-11 NOTE — Progress Notes (Signed)
[name], KLEINOW ([id number]) Visit Report for 02/10/2016 Arrival Information Details Patient Name: [name], [name] Date of Service: 02/10/2016 11:00 AM Medical Record Patient Account Number: [id number] Number: Treating RN: Montey Hora [birth year] (76 y.o. Other Clinician: Date of Birth/Sex: Female) Treating ROBSON, MICHAEL Primary Care Aveena Bari: Lavera [name] Laniyah Rosenwald/Extender: G Referring Tanveer Brammer: [name] in Treatment: 6 Visit Information History Since Last Visit Added or deleted any medications: No Patient Arrived: Walker Any new allergies or adverse reactions: No Arrival Time: 11:05 Had a fall or experienced change in No Accompanied By: self activities of daily living that may affect Transfer Assistance: None risk of falls: Patient Identification Verified: Yes Signs or symptoms of abuse/neglect since last No Secondary Verification Process Completed: Yes visito Patient Requires Transmission-Based No Hospitalized since last visit: No Precautions: Has Dressing in Place as Prescribed: Yes Patient Has Alerts: Yes Pain Present Now: No Patient Alerts: DMII Electronic Signature(s) Signed: 02/10/2016 5:15:42 PM By: Montey Hora Entered By: Montey Hora on 02/10/2016 11:05:30 [name], Poetry A. ([id number]) -------------------------------------------------------------------------------- Clinic Level of Care Assessment Details Patient Name: [name], [name] Date of Service: 02/10/2016 11:00 AM Medical Record Patient Account Number: [id number] Number: Treating RN: Montey Hora 1940-10-24 (76 y.o. Other Clinician: Date of Birth/Sex: Female) Treating ROBSON, Kenwood Primary Care Jenan Ellegood: Lavera [name] Masiya Claassen/Extender: G Referring Felecia Stanfill: [name] in Treatment: 6 Clinic Level of Care Assessment Items TOOL 4 Quantity Score []  - Use when only an EandM is performed on FOLLOW-UP visit 0 ASSESSMENTS - Nursing Assessment / Reassessment X -  Reassessment of Co-morbidities (includes updates in patient status) 1 10 X - Reassessment of Adherence to Treatment Plan 1 5 ASSESSMENTS - Wound and Skin Assessment / Reassessment X - Simple Wound Assessment / Reassessment - one wound 1 5 []  - Complex Wound Assessment / Reassessment - multiple wounds 0 []  - Dermatologic / Skin Assessment (not related to wound area) 0 ASSESSMENTS - Focused Assessment []  - Circumferential Edema Measurements - multi extremities 0 []  - Nutritional Assessment / Counseling / Intervention 0 []  - Lower Extremity Assessment (monofilament, tuning fork, pulses) 0 []  - Peripheral Arterial Disease Assessment (using hand held doppler) 0 ASSESSMENTS - Ostomy and/or Continence Assessment and Care []  - Incontinence Assessment and Management 0 []  - Ostomy Care Assessment and Management (repouching, etc.) 0 PROCESS - Coordination of Care X - Simple Patient / Family Education for ongoing care 1 15 []  - Complex (extensive) Patient / Family Education for ongoing care 0 []  - Biochemist, clinical obtains Programmer, systems, Records, Test Results / Process Orders 0 []  - Staff telephones HHA, Nursing Homes / Clarify orders / etc 0 Burkemper, Quanta A. ([id number]) []  - Routine Transfer to another Facility (non-emergent condition) 0 []  - Routine Hospital Admission (non-emergent condition) 0 []  - New Admissions / Biomedical engineer / Ordering NPWT, Apligraf, etc. 0 []  - Emergency Hospital Admission (emergent condition) 0 X - Simple Discharge Coordination 1 10 []  - Complex (extensive) Discharge Coordination 0 PROCESS - Special Needs []  - Pediatric / Minor Patient Management 0 []  - Isolation Patient Management 0 []  - Hearing / Language / Visual special needs 0 []  - Assessment of Community assistance (transportation, D/C planning, etc.) 0 []  - Additional assistance / Altered mentation 0 []  - Support Surface(s) Assessment (bed, cushion, seat, etc.) 0 INTERVENTIONS - Wound Cleansing / Measurement X - Simple  Wound Cleansing - one wound 1 5 []  - Complex Wound Cleansing - multiple wounds 0 X - Wound Imaging (photographs - any number of wounds)  1 5 []  - Wound Tracing (instead of photographs) 0 X - Simple Wound Measurement - one wound 1 5 []  - Complex Wound Measurement - multiple wounds 0 INTERVENTIONS - Wound Dressings X - Small Wound Dressing one or multiple wounds 1 10 []  - Medium Wound Dressing one or multiple wounds 0 []  - Large Wound Dressing one or multiple wounds 0 []  - Application of Medications - topical 0 []  - Application of Medications - injection 0 Molinari, Aquinnah A. ([id number]) INTERVENTIONS - Miscellaneous []  - External ear exam 0 []  - Specimen Collection (cultures, biopsies, blood, body fluids, etc.) 0 []  - Specimen(s) / Culture(s) sent or taken to Lab for analysis 0 []  - Patient Transfer (multiple staff / Harrel Lemon Lift / Similar devices) 0 []  - Simple Staple / Suture removal (25 or less) 0 []  - Complex Staple / Suture removal (26 or more) 0 []  - Hypo / Hyperglycemic Management (close monitor of Blood Glucose) 0 []  - Ankle / Brachial Index (ABI) - do not check if billed separately 0 X - Vital Signs 1 5 Has the patient been seen at the hospital within the last three years: Yes Total Score: 75 Level Of Care: New/Established - Level 2 Electronic Signature(s) Signed: 02/10/2016 5:15:42 PM By: Montey Hora Entered By: Montey Hora on 02/10/2016 11:23:35 Lenderman, Linna AMarland Kitchen ([id number]) -------------------------------------------------------------------------------- Encounter Discharge Information Details Patient Name: [name], [name] Date of Service: 02/10/2016 11:00 AM Medical Record Patient Account Number: [id number] Number: Treating RN: Montey Hora [birth year] (76 y.o. Other Clinician: Date of Birth/Sex: Female) Treating ROBSON, MICHAEL Primary Care Jenean Escandon: Lavera [name] Shigeo Baugh/Extender: G Referring Roshawn Ayala: [name] in Treatment: 6 Encounter Discharge  Information Items Discharge Pain Level: 0 Discharge Condition: Stable Ambulatory Status: Walker Discharge Destination: Home Transportation: Private Auto Accompanied By: self Schedule Follow-up Appointment: Yes Medication Reconciliation completed No and provided to Patient/Care Aniella Wandrey: Provided on Clinical Summary of Care: 02/10/2016 Form Type Recipient Paper Patient AW Electronic Signature(s) Signed: 02/10/2016 11:33:35 AM By: Ruthine Dose Entered By: Ruthine Dose on 02/10/2016 11:33:35 Thiemann, Coretta A. ([id number]) -------------------------------------------------------------------------------- Multi Wound Chart Details Patient Name: Radilla, Chauntay A. Date of Service: 02/10/2016 11:00 AM Medical Record Patient Account Number: [id number] Number: Treating RN: Montey Hora Dec 29, 1940 (75 y.o. Other Clinician: Date of Birth/Sex: Female) Treating ROBSON, MICHAEL Primary Care Chantille Navarrete: Lavera [name] Kenshin Splawn/Extender: G Referring Ryenne Lynam: [name] in Treatment: 6 Vital Signs Height(in): 62 Pulse(bpm): 74 Weight(lbs): 161 Blood Pressure 119/41 (mmHg): Body Mass Index(BMI): 29 Temperature(F): 98.1 Respiratory Rate 18 (breaths/min): Photos: [N/A:N/A] Wound Location: Chest - Midline, Distal N/A N/A Wounding Event: Surgical Injury N/A N/A Primary Etiology: Open Surgical Wound N/A N/A Comorbid History: Cataracts, Anemia, N/A N/A Congestive Heart Failure, Coronary Artery Disease, Hypertension, Type II Diabetes, Osteoarthritis, Neuropathy, Received Chemotherapy Date Acquired: 11/11/2015 N/A N/A Weeks of Treatment: 5 N/A N/A Wound Status: Open N/A N/A Measurements L x W x D 0.5x0.4x1.2 N/A N/A (cm) Area (cm) : 0.157 N/A N/A Volume (cm) : 0.188 N/A N/A % Reduction in Area: 42.90% N/A N/A % Reduction in Volume: 67.40% N/A N/A Classification: N/A N/A Cohen, Lalia A. ([id number]) Full Thickness Without Exposed Support Structures Exudate Amount:  Large N/A N/A Exudate Type: Serosanguineous N/A N/A Exudate Color: red, brown N/A N/A Wound Margin: Flat and Intact N/A N/A Granulation Amount: Large (67-100%) N/A N/A Granulation Quality: Red N/A N/A Necrotic Amount: None Present (0%) N/A N/A Exposed Structures: Fascia: No N/A N/A Fat Layer (Subcutaneous Tissue) Exposed: No Tendon: No Muscle: No Joint: No Bone:  No Limited to Skin Breakdown Epithelialization: None N/A N/A Periwound Skin Texture: Excoriation: No N/A N/A Induration: No Callus: No Crepitus: No Rash: No Scarring: No Periwound Skin Maceration: No N/A N/A Moisture: Dry/Scaly: No Periwound Skin Color: Atrophie Blanche: No N/A N/A Cyanosis: No Ecchymosis: No Erythema: No Hemosiderin Staining: No Mottled: No Pallor: No Rubor: No Temperature: No Abnormality N/A N/A Tenderness on No N/A N/A Palpation: Wound Preparation: Ulcer Cleansing: N/A N/A Rinsed/Irrigated with Saline Topical Anesthetic Applied: Other: lidocaine 4% Treatment Notes TAHEERAH, NARASIMHAN ([id number]) Electronic Signature(s) Signed: 02/10/2016 5:54:02 PM By: [name] MD Entered By: [name] on 02/10/2016 11:37:12 Kesling, [name] ([id number]) -------------------------------------------------------------------------------- [place] Details Patient Name: Maddux, Nikiya A. Date of Service: 02/10/2016 11:00 AM Medical Record Patient Account Number: [id number] Number: Treating RN: Montey Hora 30-Dec-1940 (75 y.o. Other Clinician: Date of Birth/Sex: Female) Treating ROBSON, MICHAEL Primary Care Dontavion Noxon: Lavera [name] Ellenore Roscoe/Extender: G Referring Arianne Klinge: [name] in Treatment: 6 Active Inactive ` Abuse / Safety / Falls / Self Care Management Nursing Diagnoses: Impaired physical mobility Potential for falls Goals: Patient will remain injury free Date Initiated: 12/24/2015 Target Resolution Date: 02/10/2016 Goal Status:  Active Interventions: Assess fall risk on admission and as needed Notes: ` Orientation to the Wound Care Program Nursing Diagnoses: Knowledge deficit related to the wound healing center program Goals: Patient/caregiver will verbalize understanding of the Cook Date Initiated: 12/24/2015 Target Resolution Date: 02/10/2016 Goal Status: Active Interventions: Provide education on orientation to the wound center Notes: ` Wound/Skin Impairment Ozanich, Erika A. ([id number]) Nursing Diagnoses: Impaired tissue integrity Goals: Patient/caregiver will verbalize understanding of skin care regimen Date Initiated: 12/24/2015 Target Resolution Date: 02/10/2016 Goal Status: Active Ulcer/skin breakdown will have a volume reduction of 30% by week 4 Date Initiated: 12/24/2015 Target Resolution Date: 02/10/2016 Goal Status: Active Ulcer/skin breakdown will have a volume reduction of 50% by week 8 Date Initiated: 12/24/2015 Target Resolution Date: 02/24/2016 Goal Status: Active Ulcer/skin breakdown will have a volume reduction of 80% by week 12 Date Initiated: 12/24/2015 Target Resolution Date: 03/23/2016 Goal Status: Active Ulcer/skin breakdown will heal within 14 weeks Date Initiated: 12/24/2015 Target Resolution Date: 04/06/2016 Goal Status: Active Interventions: Assess patient/caregiver ability to obtain necessary supplies Assess patient/caregiver ability to perform ulcer/skin care regimen upon admission and as needed Assess ulceration(s) every visit Notes: Electronic Signature(s) Signed: 02/10/2016 5:15:42 PM By: Montey Hora Entered By: Montey Hora on 02/10/2016 11:13:05 Mckey, Caniyah A. ([id number]) -------------------------------------------------------------------------------- Pain Assessment Details Patient Name: Nason, Clare A. Date of Service: 02/10/2016 11:00 AM Medical Record Patient Account Number: [id number] Number: Treating RN: Montey Hora 10-Nov-1940 (75 y.o. Other Clinician: Date of Birth/Sex: Female) Treating ROBSON, MICHAEL Primary Care Verl Whitmore: Lavera [name] Coutney Wildermuth/Extender: G Referring Adalynne Steffensmeier: [name] in Treatment: 6 Active Problems Location of Pain Severity and Description of Pain Patient Has Paino No Site Locations Pain Management and Medication Current Pain Management: Notes Topical or injectable lidocaine is offered to patient for acute pain when surgical debridement is performed. If needed, Patient is instructed to use over the counter pain medication for the following 24-48 hours after debridement. Wound care MDs do not prescribed pain medications. Patient has chronic pain or uncontrolled pain. Patient has been instructed to make an appointment with their Primary Care Physician for pain management. Electronic Signature(s) Signed: 02/10/2016 5:15:42 PM By: Montey Hora Entered By: Montey Hora on 02/10/2016 11:05:38 Micale, [name] ([id number]) -------------------------------------------------------------------------------- Patient/Caregiver Education Details Patient Name: Gonce, Talecia A. Date of Service: 02/10/2016  11:00 AM Medical Record Patient Account Number: [id number] Number: Treating RN: Montey Hora [birth year] (75 y.o. Other Clinician: Date of Birth/Gender: Female) Treating ROBSON, MICHAEL Primary Care Physician: Lavera [name] Physician/Extender: G Referring Physician: [name] in Treatment: 6 Education Assessment Education Provided To: Patient Education Topics Provided Wound/Skin Impairment: Handouts: Other: wound care as ordered Methods: Demonstration, Explain/Verbal Responses: State content correctly Electronic Signature(s) Signed: 02/10/2016 5:15:42 PM By: Montey Hora Entered By: Montey Hora on 02/10/2016 11:14:22 Jubb, Ladawn A. ([id number]) -------------------------------------------------------------------------------- Wound  Assessment Details Patient Name: Bezek, Adelyne A. Date of Service: 02/10/2016 11:00 AM Medical Record Patient Account Number: [id number] Number: Treating RN: Montey Hora 1940-01-29 (75 y.o. Other Clinician: Date of Birth/Sex: Female) Treating ROBSON, MICHAEL Primary Care Kalil Woessner: Lavera [name] Blessin Kanno/Extender: G Referring Sedric Guia: [name] in Treatment: 6 Wound Status Wound Number: 2 Primary Open Surgical Wound Etiology: Wound Location: Chest - Midline, Distal Wound Open Wounding Event: Surgical Injury Status: Date Acquired: 11/11/2015 Comorbid Cataracts, Anemia, Congestive Heart Weeks Of Treatment: 5 History: Failure, Coronary Artery Disease, Clustered Wound: No Hypertension, Type II Diabetes, Osteoarthritis, Neuropathy, Received Chemotherapy Photos Wound Measurements Length: (cm) 0.5 Width: (cm) 0.4 Depth: (cm) 1.2 Area: (cm) 0.157 Volume: (cm) 0.188 % Reduction in Area: 42.9% % Reduction in Volume: 67.4% Epithelialization: None Tunneling: No Undermining: No Wound Description Full Thickness Without Exposed Foul Odor After C Classification: Support Structures Slough/Fibrino Wound Margin: Flat and Intact Exudate Large Amount: Exudate Type: Serosanguineous Exudate Color: red, brown Oppedisano, Ariyona A. ([id number]) leansing: No No Wound Bed Granulation Amount: Large (67-100%) Exposed Structure Granulation Quality: Red Fascia Exposed: No Necrotic Amount: None Present (0%) Fat Layer (Subcutaneous Tissue) Exposed: No Tendon Exposed: No Muscle Exposed: No Joint Exposed: No Bone Exposed: No Limited to Skin Breakdown Periwound Skin Texture Texture Color No Abnormalities Noted: No No Abnormalities Noted: No Callus: No Atrophie Blanche: No Crepitus: No Cyanosis: No Excoriation: No Ecchymosis: No Induration: No Erythema: No Rash: No Hemosiderin Staining: No Scarring: No Mottled: No Pallor: No Moisture Rubor: No No  Abnormalities Noted: No Dry / Scaly: No Temperature / Pain Maceration: No Temperature: No Abnormality Wound Preparation Ulcer Cleansing: Rinsed/Irrigated with Saline Topical Anesthetic Applied: Other: lidocaine 4%, Treatment Notes Wound #2 (Distal, Midline Chest) 1. Cleansed with: Clean wound with Normal Saline 4. Dressing Applied: Iodoform packing Gauze 5. Secondary Dressing Applied [place] Signature(s) Signed: 02/10/2016 5:15:42 PM By: Montey Hora Entered By: Montey Hora on 02/10/2016 11:12:54 Lamontagne, Anyra A. ([id number]) -------------------------------------------------------------------------------- Vitals Details Patient Name: Ralph, Shyane A. Date of Service: 02/10/2016 11:00 AM Medical Record Patient Account Number: [id number] Number: Treating RN: Montey Hora 12-Aug-1940 (75 y.o. Other Clinician: Date of Birth/Sex: Female) Treating ROBSON, MICHAEL Primary Care Ranjit Ashurst: Lavera [name] Brittlyn Cloe/Extender: G Referring Althia Egolf: [name] in Treatment: 6 Vital Signs Time Taken: 11:06 Temperature (F): 98.1 Height (in): 62 Pulse (bpm): 74 Weight (lbs): 161 Respiratory Rate (breaths/min): 18 Body Mass Index (BMI): 29.4 Blood Pressure (mmHg): 119/41 Reference Range: 80 - 120 mg / dl Electronic Signature(s) Signed: 02/10/2016 5:15:42 PM By: Montey Hora Entered By: Montey Hora on 02/10/2016 11:08:02

## 2016-02-12 ENCOUNTER — Ambulatory Visit

## 2016-02-17 ENCOUNTER — Ambulatory Visit

## 2016-02-19 ENCOUNTER — Ambulatory Visit

## 2016-02-23 ENCOUNTER — Encounter: Payer: Self-pay | Admitting: Cardiology

## 2016-02-23 ENCOUNTER — Ambulatory Visit: Payer: Medicare Other | Admitting: Cardiology

## 2016-02-23 ENCOUNTER — Ambulatory Visit (INDEPENDENT_AMBULATORY_CARE_PROVIDER_SITE_OTHER): Payer: Medicare Other | Admitting: Cardiology

## 2016-02-23 VITALS — BP 130/62 | HR 57 | Ht 62.0 in | Wt 161.0 lb

## 2016-02-23 DIAGNOSIS — R6883 Chills (without fever): Secondary | ICD-10-CM | POA: Diagnosis not present

## 2016-02-23 DIAGNOSIS — Z951 Presence of aortocoronary bypass graft: Secondary | ICD-10-CM | POA: Diagnosis not present

## 2016-02-23 DIAGNOSIS — I25708 Atherosclerosis of coronary artery bypass graft(s), unspecified, with other forms of angina pectoris: Secondary | ICD-10-CM | POA: Diagnosis not present

## 2016-02-23 DIAGNOSIS — I209 Angina pectoris, unspecified: Secondary | ICD-10-CM | POA: Diagnosis not present

## 2016-02-23 DIAGNOSIS — E785 Hyperlipidemia, unspecified: Secondary | ICD-10-CM | POA: Diagnosis not present

## 2016-02-23 DIAGNOSIS — I2510 Atherosclerotic heart disease of native coronary artery without angina pectoris: Secondary | ICD-10-CM | POA: Diagnosis not present

## 2016-02-23 DIAGNOSIS — I255 Ischemic cardiomyopathy: Secondary | ICD-10-CM | POA: Insufficient documentation

## 2016-02-23 DIAGNOSIS — I10 Essential (primary) hypertension: Secondary | ICD-10-CM | POA: Diagnosis not present

## 2016-02-23 DIAGNOSIS — R109 Unspecified abdominal pain: Secondary | ICD-10-CM

## 2016-02-23 DIAGNOSIS — R634 Abnormal weight loss: Secondary | ICD-10-CM | POA: Insufficient documentation

## 2016-02-23 MED ORDER — ISOSORBIDE MONONITRATE ER 30 MG PO TB24: 30.0000 mg | ORAL_TABLET | Freq: Every day | ORAL | 3 refills | Status: DC

## 2016-02-23 MED ORDER — METOPROLOL TARTRATE 25 MG PO TABS: 12.5000 mg | ORAL_TABLET | Freq: Two times a day (BID) | ORAL | 3 refills | Status: DC

## 2016-02-23 NOTE — Patient Instructions (Addendum)
Medication Instructions:   DECREASE YOUR METOPROLOL TARTRATE (LOPRESSOR) TO 12.5 MG TWICE DAILY  DECREASE YOUR IMDUR TO 30 MG ONCE DAILY    Labwork:  TODAY-TSH    You have been referred to [facility]:  2 MONTHS WITH DR Meda Coffee   NEW PATIENT APPOINTMENT HERE IN OUR [facility] WITH OUR PHARMACIST       If you need a refill on your cardiac medications before your next appointment, please call your pharmacy.

## 2016-02-23 NOTE — Progress Notes (Signed)
Cardiology Office Note    Date:  02/23/2016   ID:  [name], DOB Mar 11, 1940, MRN [id number]  PCP:  [name] Jude, MD  Cardiologist:   [name] Dawley, MD   Chief complain: post-hospitalization follow up  History of Present Illness:  [name] is a 76 y.o. female with a past medical history of CAD s/p CABG, DM, HTN, PAD s/p L fem pop in 2013, multiple myeloma, and ischemic cardiomyopathy. [name] presented to [name] ED on 01/13/15 with severe back pain which is historically her anginal equivalent.  [name], felt tired and very nauseous. [name] took multiple doses of Zofran which intermittently helped her nausea. [name] went to sleep and was awakened with severe left sided back pain with associated SOB and continued nausea.   [name] called EMS and was transport to [facility]. Her EKG on arrival showed NSR with evidence of old anterior and inferior infarcts. No acute ST changes when compared to her prior EKG's. [name] continued to feel nauseous but her back pain has been alleviated somewhat.   In May 2017 [name] presented to [facility] with abdominal pain and chest discomfort, [name] ruled in for NSTEMI and underwent heart catheterization. [name] had DES placed to [name] mid and distal LAD as well as OM2. Also with mid circumflex lesion that was not intervened upon as it was felt to be chronic.   In October 2017 [name] presented to [facility] with a 2 [name] history of persistent nausea and vomiting. In [name] ED [name] developed left sided jaw pain and substernal chest pressure. Pharmacologic stress test was abnormal and [name] underwent LHC. [name] subsequently underwent 2 vessel CABG on 11/11/15 with LIMA-LAD and SVG-OM.   Her last Echo was in August 2017 at that time her EF was 45-50%, moderate MR, septal basilar akinesis.   [name] tells me that her back pain is her anginal equivalent (although not charted as such when notes reviewed in [place]). [name] suffers from chronic pain  from her multiple myeloma but says that her pain is usually in her legs.   Troponin is negative.   LHC: 01/15/2016 patent bypass grafts.  2. [name] distal left main has a 70% stenosis.  3. [name] LAD has diffuse proximal 90% stenosis. [name] mid LAD stent has 80% restenosis in [name] proximal segment of [name] stent. [name] mid and distal vessel fills from [name] patent LIMA graft. Competitive flow is seen in [name] mid LAD on antegrade injections.  4. [name] Circumflex has a patent mid stent. [name] first OM branch has diffuse moderate stenosis. [name] vein graft inserts into this OM branch. [name] entire Circumflex and OM system fills from antegrade flow and from [name] vein graft.  5. [name] RCA is a small to moderate caliber non-dominant vessel with 100% proximal occlusion. [name] entire vessel fills from left to right collaterals.   Recommendations: No targets for PCI. Both grafts are open. [name] RCA is chronically occluded but fills from collaterals. Continue medical management of CAD.   02/23/2016 - [name] is coming after 1 months, in [name] interim [name] hasn't experienced any chest pain. [name] has been experiencing right-sided abdominal pain after every meal, her primary care physician order abdominal ultrasound that didn't show any signs of acute cholecystitis. [name] has lost 40 pounds in [name] last 2 months. [name] continues to experience chills, [name] has been treated for urinary tract infection that is recurrent in her case. No fever no cough  recent chest x-ray negative for any pneumonia. No palpitations or syncope no lower extremity edema orthopnea or proximal nocturnal dyspnea. [name] has been experiencing significant fatigue and orthostatic hypotension.   Past Medical History:  Diagnosis Date  . Anginal pain ([place])   . Anxiety   . Arthritis    "shoulders primarily" (01/13/2016)  . CHF (congestive heart failure) ([place])   . Chronic pain    from her multiple myeloma but says that her pain is usually in her legs/notes 01/13/2016  . High  cholesterol   . History of blood transfusion    "numerous; related to procedures for my heart" (01/13/2016)  . Hypertension   . Ischemic cardiomyopathy    /nots 01/13/2016  . MI (myocardial infarction)    EKG on arrival 01/13/2016 showed NSR with evidence of old anterior and inferior infarcts  . MI (myocardial infarction) 08/2015  . Multiple myeloma ([place]) dx'd 2015  . PAD (peripheral artery disease) ([place])   . Stroke [facility])    "several in 1 year; ? year" (01/13/2016)  . Type II diabetes mellitus ([place])     Past Surgical History:  Procedure Laterality Date  . CARDIAC CATHETERIZATION N/A 01/14/2016   Procedure: Left Heart Cath and Cors/Grafts Angiography;  Surgeon: Burnell Blanks, MD;  Location: [place] [facility];  Service: Cardiovascular;  Laterality: N/A;  . CARPAL TUNNEL RELEASE Left   . CORONARY ANGIOPLASTY WITH STENT PLACEMENT  05/2015   DES placed to [name] mid and distal LAD as well as OM2/notes 01/13/2016  . CORONARY ANGIOPLASTY WITH STENT PLACEMENT  ~ 2010  . CORONARY ARTERY BYPASS GRAFT  11/11/2015   CABG X2 LIMA-LAD and SVG-OM/notes 01/13/2016  . DILATION AND CURETTAGE OF UTERUS    . FEMORAL-POPLITEAL BYPASS GRAFT Left 2013   Archie Endo 01/13/2016  . LAPAROSCOPIC GASTRIC BANDING  2007  . RENAL ARTERY STENT    . VAGINAL HYSTERECTOMY      Current Medications: Outpatient Medications Prior to Visit  Medication Sig Dispense Refill  . aspirin 81 MG tablet Take 81 mg by mouth daily.    . Cholecalciferol (VITAMIN D3) 50000 units CAPS Take 1 capsule by mouth 2 (two) times a week.    . clopidogrel (PLAVIX) 75 MG tablet Take 75 mg by mouth daily.    . diclofenac sodium (VOLTAREN) 1 % GEL Apply 2 g topically daily as needed for pain.    . fentaNYL (DURAGESIC - DOSED MCG/HR) 25 MCG/HR patch Place 25 mcg onto [name] skin every 3 (three) days.    Marland Kitchen gabapentin (NEURONTIN) 300 MG capsule Take 300-600 mg by mouth See admin instructions. Pt takes 678m in am, 3051mat bedtime    . hydrocortisone cream 1  % Apply topically as needed for itching. 30 g 0  . HYDROmorphone (DILAUDID) 2 MG tablet Take 2 mg by mouth every 4 (four) hours as needed for severe pain.    . Marland Kitchenisinopril (PRINIVIL,ZESTRIL) 5 MG tablet Take 1 tablet (5 mg total) by mouth daily. 30 tablet 12  . metFORMIN (GLUCOPHAGE) 500 MG tablet Take 1,000 mg by mouth 2 (two) times daily with a meal.     . nitroGLYCERIN (NITROSTAT) 0.4 MG SL tablet Place 1 tablet (0.4 mg total) under [name] tongue every 5 (five) minutes as needed for chest pain. 25 tablet 2  . ondansetron (ZOFRAN-ODT) 4 MG disintegrating tablet Take 4 mg by mouth every 8 (eight) hours as needed for nausea or vomiting.    . senna-docusate (SENOKOT-S) 8.6-50 MG tablet Take 2  tablets by mouth 2 (two) times daily as needed for constipation.    . isosorbide mononitrate (IMDUR) 60 MG 24 hr tablet Take 1 tablet (60 mg total) by mouth daily. 30 tablet 12  . metoprolol tartrate (LOPRESSOR) 25 MG tablet Take 1 tablet (25 mg total) by mouth 2 (two) times daily. 60 tablet 12   No facility-administered medications prior to visit.      Allergies:   Other; Amoxicillin; Codeine; Duloxetine; Statins; and Sulfa antibiotics   Social History   Social History  . Marital status: Divorced    Spouse name: N/A  . Number of children: N/A  . Years of education: N/A   Social History Main Topics  . Smoking status: Never Smoker  . Smokeless tobacco: Never Used  . Alcohol use No  . Drug use: No  . Sexual activity: Not Asked   Other Topics Concern  . None   Social History Narrative  . None     Family History:  [name]'s family history includes Diabetes in her mother; Heart failure in her mother.   ROS:   Please see [name] history of present illness.    ROS All other systems reviewed and are negative.   PHYSICAL EXAM:   VS:  BP 130/62   Pulse (!) 57   Ht 5' 2"  (1.575 m)   Wt 161 lb (73 kg)   SpO2 98%   BMI 29.45 kg/m    GEN: Well nourished, well developed, in no acute distress    HEENT: normal  Neck: no JVD, carotid bruits, or masses Cardiac: RRR; no murmurs, rubs, or gallops,no edema  Respiratory:  clear to auscultation bilaterally, normal work of breathing GI: soft, nontender, nondistended, + BS MS: no deformity or atrophy  Skin: warm and dry, no rash Neuro:  Alert and Oriented x 3, Strength and sensation are intact Psych: euthymic mood, full affect  Wt Readings from Last 3 Encounters:  02/23/16 161 lb (73 kg)  01/15/16 160 lb 15 oz (73 kg)  12/15/15 166 lb (75.3 kg)      Studies/Labs Reviewed:   EKG:  Not done today  Recent Labs: 01/13/2016: B Natriuretic Peptide 438.8 01/15/2016: BUN 14; Creatinine, Ser 1.26; Hemoglobin 10.0; Platelets 214; Potassium 3.8; Sodium 137   Lipid Panel    Component Value Date/Time   CHOL 207 (H) 01/14/2016 0202   TRIG 214 (H) 01/14/2016 0202   HDL 31 (L) 01/14/2016 0202   CHOLHDL 6.7 01/14/2016 0202   VLDL 43 (H) 01/14/2016 0202   LDLCALC 133 (H) 01/14/2016 0202    Additional studies/ records that were reviewed today include:   TTE: 01/2016 - Procedure narrative: Transthoracic echocardiography. Image   quality was poor. [name] study was technically difficult, as a   result of poor acoustic windows and surgical dressings.   Intravenous contrast (Definity) was administered. - Left ventricle: [name] cavity size was normal. Systolic function was   vigorous. [name] estimated ejection fraction was in [name] range of 65%   to 70%. Wall motion was normal; there were no regional wall   motion abnormalities. Doppler parameters are consistent with   abnormal left ventricular relaxation (grade 1 diastolic   dysfunction). [name] E/e&' ratio is between 8-15, suggesting   indeterminate LV filling pressure. - Inferior vena cava: [name] vessel was normal in size. [name]   respirophasic diameter changes were in [name] normal range (>= 50%),   consistent with normal central venous pressure.  Impressions:  - Technically difficult study. Definity  contrast  was administered.   LVEF 65-70%, normal wall motion, diastolic dysfunction,   indeterminate LV filling pressure, normal IVC.    ASSESSMENT:    1. Chronic coronary artery disease   2. S/P CABG x 2   3. Hyperlipidemia, unspecified hyperlipidemia type   4. Essential hypertension   5. Chills   6. Weight loss   7. Abdominal pain, unspecified abdominal location   8. Coronary artery disease of bypass graft of native heart with stable angina pectoris ([place])      PLAN:  In order of problems listed above:  1. CAD: See cath report above. 2/2 grafts open. Will continue medical management of her CAD. I will decrease Imdur to 30 mg daily as [name] is experiencing orthostatic hypotension, as well as metoprolol to 12.5 by mouth twice a [name] because of profound fatigue. Will remain on ASA and Plavix.   2. S/p CABG - as above, intolerant to at least 3 statins and zetia - severe joint pain, we will refer to th elipid clinic for consideration of PCSK 9 inhibitors.   3. Recurrent postprandial abdominal pain, will refer to GI for further evaluation. [name] hasn't had colonoscopy no last 5 years.  4. History of multiple myeloma: On home medications.[name] sees Dr. Laverta Baltimore at [facility] for this. [name] would like to switch to [facility] oncologist, we will try to find a specialist and refer her.  5. Sternal wound: [name] expresses that [name] is being treated for a wound infection at her CABG site. Healing well, [name] is seeing wound specialist.  6. Ischemic cardiomyopathy: Last EF 65-70%, continue beta blocker and ACE-I.   7. Chills and weight loss, we will check TSH today.     Medication Adjustments/Labs and Tests Ordered: Current medicines are reviewed at length with [name] today.  Concerns regarding medicines are outlined above.  Medication changes, Labs and Tests ordered today are listed in [name] Instructions below. [name] Instructions  Medication Instructions:   DECREASE YOUR METOPROLOL  TARTRATE (LOPRESSOR) TO 12.5 MG TWICE DAILY  DECREASE YOUR IMDUR TO 30 MG ONCE DAILY    Labwork:  TODAY-TSH    You have been referred to GASTROENTEROLOGY FOR COMPLAINTS OF ABDOMINAL PAIN      Follow-Up:  AT DR Taeya Theall'S NEXT AVAILABLE APPOINTMENT   NEW [name] APPOINTMENT HERE IN OUR LIPID CLINIC WITH OUR PHARMACIST       If you need a refill on your cardiac medications before your next appointment, please call your pharmacy.      Signed, [name] Dawley, MD  02/23/2016 2:57 PM    [facility] [place], [place], [place]  [address] Phone: [phone]; Fax: [phone]

## 2016-02-24 ENCOUNTER — Ambulatory Visit

## 2016-02-24 ENCOUNTER — Encounter: Payer: Self-pay | Admitting: Gastroenterology

## 2016-02-24 ENCOUNTER — Ambulatory Visit (INDEPENDENT_AMBULATORY_CARE_PROVIDER_SITE_OTHER): Payer: Medicare Other | Admitting: Gastroenterology

## 2016-02-24 ENCOUNTER — Telehealth: Payer: Self-pay | Admitting: Cardiology

## 2016-02-24 VITALS — BP 150/60 | HR 60 | Ht 62.0 in | Wt 155.4 lb

## 2016-02-24 DIAGNOSIS — R1013 Epigastric pain: Secondary | ICD-10-CM

## 2016-02-24 DIAGNOSIS — R634 Abnormal weight loss: Secondary | ICD-10-CM | POA: Diagnosis not present

## 2016-02-24 LAB — TSH: TSH: 3.43 u[IU]/mL (ref 0.450–4.500)

## 2016-02-24 NOTE — Progress Notes (Signed)
HPI: This is a very pleasant 76 year old woman  who was referred to me by Ottie Glazier MD for intermittent epigastric pains  Chief complaint is intermittent epigastric pains, weight loss  She has had epigatsric pains since her CABG several months ago.  Epigasgtric pains that radiate to her back and also her jaw and her left arm.  Also since the CABG she's had daily nausea. Since the CABG she has lost 30-40 pounds.   THe changes all started after her CABG.  She will occasionally have post prandial pains (once per week at least), radiates to jaw, left arm, to back, gets mild SOB  2007 lap band bariatric surgery at [facility].  She stopped treatment for multiple myeloma (stopped in May of 2016, which was OK per another heme doctor).  She has had colonoscopy and I believe upper endoscopy in the past. These were done elsewhere. At least 5 years ago.  Old Data Reviewed:  Abdominal ultrasound January 2018 was essentially normal.    ECHO early January 2018 showed LVEF 60%  Angiogram 01/2016: 1. Severe triple vessel CAD with distal left main stenosis, s/p 2V CABG with 2/2 patent bypass grafts.  2. The distal left main has a 70% stenosis.  3. The LAD has diffuse proximal 90% stenosis. The mid LAD stent has 80% restenosis in the proximal segment of the stent. The mid and distal vessel fills from the patent LIMA graft. Competitive flow is seen in the mid LAD on antegrade injections.  4. The Circumflex has a patent mid stent. The first OM branch has diffuse moderate stenosis. The vein graft inserts into this OM branch. The entire Circumflex and OM system fills from antegrade flow and from the vein graft.  5. The RCA is a small to moderate caliber non-dominant vessel with 100% proximal occlusion. The entire vessel fills from left to right collaterals.  Previous [facility] cardiology patient; she does not want to go back to see them ever.  Review of systems: Pertinent positive and negative review of systems were  noted in the above HPI section. Complete review of systems was performed and was otherwise normal.   Past Medical History:  Diagnosis Date  . Anginal pain ([place])   . Anxiety   . Arthritis    "shoulders primarily" (01/13/2016)  . CHF (congestive heart failure) ([place])   . Chronic pain    from her multiple myeloma but says that her pain is usually in her legs/notes 01/13/2016  . High cholesterol   . History of blood transfusion    "numerous; related to procedures for my heart" (01/13/2016)  . Hypertension   . Ischemic cardiomyopathy    /nots 01/13/2016  . MI (myocardial infarction)    EKG on arrival 01/13/2016 showed NSR with evidence of old anterior and inferior infarcts  . MI (myocardial infarction) 08/2015  . Multiple myeloma ([place]) dx'd 2015  . PAD (peripheral artery disease) ([place])   . Stroke [facility])    "several in 1 year; ? year" (01/13/2016)  . Type II diabetes mellitus ([place])     Past Surgical History:  Procedure Laterality Date  . CARDIAC CATHETERIZATION N/A 01/14/2016   Procedure: Left Heart Cath and Cors/Grafts Angiography;  Surgeon: Burnell Blanks, MD;  Location: [place] [facility];  Service: Cardiovascular;  Laterality: N/A;  . CARPAL TUNNEL RELEASE Left   . CORONARY ANGIOPLASTY WITH STENT PLACEMENT  05/2015   DES placed to the mid and distal LAD as well as OM2/notes 01/13/2016  . CORONARY ANGIOPLASTY  WITH STENT PLACEMENT  ~ 2010  . CORONARY ARTERY BYPASS GRAFT  11/11/2015   CABG X2 LIMA-LAD and SVG-OM/notes 01/13/2016  . DILATION AND CURETTAGE OF UTERUS    . FEMORAL-POPLITEAL BYPASS GRAFT Left 2013   Archie Endo 01/13/2016  . LAPAROSCOPIC GASTRIC BANDING  2007  . RENAL ARTERY STENT    . VAGINAL HYSTERECTOMY      Current Outpatient Prescriptions  Medication Sig Dispense Refill  . aspirin 81 MG tablet Take 81 mg by mouth daily.    . Cholecalciferol (VITAMIN D3) 50000 units CAPS Take 1 capsule by mouth 2 (two) times a week.    . clopidogrel (PLAVIX) 75 MG tablet Take 75 mg by mouth  daily.    . diclofenac sodium (VOLTAREN) 1 % GEL Apply 2 g topically daily as needed for pain.    . fentaNYL (DURAGESIC - DOSED MCG/HR) 50 MCG/HR Place 50 mcg onto the skin every 3 (three) days.    Marland Kitchen gabapentin (NEURONTIN) 300 MG capsule Take 300-600 mg by mouth See admin instructions. Pt takes 695m in am, 3063mat bedtime    . hydrocortisone cream 1 % Apply topically as needed for itching. 30 g 0  . HYDROmorphone (DILAUDID) 2 MG tablet Take 2 mg by mouth every 4 (four) hours as needed for severe pain.    . isosorbide mononitrate (IMDUR) 30 MG 24 hr tablet Take 1 tablet (30 mg total) by mouth daily. 90 tablet 3  . lisinopril (PRINIVIL,ZESTRIL) 5 MG tablet Take 1 tablet (5 mg total) by mouth daily. 30 tablet 12  . metFORMIN (GLUCOPHAGE) 500 MG tablet Take 1,000 mg by mouth 2 (two) times daily with a meal.     . metoprolol tartrate (LOPRESSOR) 25 MG tablet Take 0.5 tablets (12.5 mg total) by mouth 2 (two) times daily. 90 tablet 3  . ondansetron (ZOFRAN-ODT) 4 MG disintegrating tablet Take 4 mg by mouth every 8 (eight) hours as needed for nausea or vomiting.    . senna-docusate (SENOKOT-S) 8.6-50 MG tablet Take 2 tablets by mouth 2 (two) times daily as needed for constipation.    . nitroGLYCERIN (NITROSTAT) 0.4 MG SL tablet Place 1 tablet (0.4 mg total) under the tongue every 5 (five) minutes as needed for chest pain. (Patient not taking: Reported on 02/24/2016) 25 tablet 2   No current facility-administered medications for this visit.     Allergies as of 02/24/2016 - Review Complete 02/24/2016  Allergen Reaction Noted  . Other Swelling 09/27/2014  . Amoxicillin  07/26/2015  . Codeine  07/26/2015  . Duloxetine Nausea Only 07/07/2015  . Statins Swelling 11/29/2015  . Sulfa antibiotics  01/13/2016    Family History  Problem Relation Age of Onset  . Diabetes Mother   . Heart failure Mother     Social History   Social History  . Marital status: Divorced    Spouse name: N/A  . Number of  children: 2  . Years of education: N/A   Occupational History  . retired    Social History Main Topics  . Smoking status: Never Smoker  . Smokeless tobacco: Never Used  . Alcohol use Yes     Comment: occasional wine  . Drug use: No  . Sexual activity: Not on file   Other Topics Concern  . Not on file   Social History Narrative  . No narrative on file     Physical Exam: BP (!) 150/60 (BP Location: Left Arm, Patient Position: Sitting, Cuff Size: Normal)   Pulse 60  Ht _0  (1.575 m) Comment: height measured without shoes  Wt 155 lb 6 oz (70.5 kg)   BMI 28.42 kg/m  Constitutional: generally well-appearing, walks with a walker Psychiatric: alert and oriented x3 Eyes: extraocular movements intact Mouth: oral pharynx moist, no lesions Neck: supple no lymphadenopathy Cardiovascular: heart regular rate and rhythm Lungs: clear to auscultation bilaterally Abdomen: soft, nontender, nondistended, no obvious ascites, no peritoneal signs, normal bowel sounds Extremities: no lower extremity edema bilaterally Skin: no lesions on visible extremities   Assessment and plan: 76 y.o. female with  intermittent epigastric pains, weight loss  Her epigastric pains are often associated with radiation to the back, jaw pains in left arm pains. I'm suspicious that these are anginal pains. She says nitroglycerin usually helps. She certainly has lost a lot of weight in the past few months, 30 or 40 pounds. I recommended we proceed with upper endoscopy at her soonest convenience to exclude neoplasm, peptic ulcer disease, significant inflammation as possible other causes.  SHe can stay on Plavix for that test.   Please see the "Patient Instructions" section for addition details about the plan.   Owens Loffler, MD [facility] 02/24/2016, 10:11 AM  Cc: Lynnell Jude, MD

## 2016-02-24 NOTE — Telephone Encounter (Signed)
Follow up    Returning Ivys phone call please call

## 2016-02-24 NOTE — Telephone Encounter (Signed)
Notes Recorded by Nuala Alpha, LPN on [id number] at 579FGE PM EST Notified the pt that per Dr Meda Coffee, she had a normal TSH.  Pt verbalized understanding.

## 2016-02-24 NOTE — Patient Instructions (Signed)
You will be set up for an upper endoscopy for weight loss, epigastric abdominal pains ([facility])

## 2016-02-25 ENCOUNTER — Encounter: Payer: Medicare Other | Attending: Internal Medicine | Admitting: Internal Medicine

## 2016-02-25 DIAGNOSIS — Z7984 Long term (current) use of oral hypoglycemic drugs: Secondary | ICD-10-CM | POA: Diagnosis not present

## 2016-02-25 DIAGNOSIS — E1165 Type 2 diabetes mellitus with hyperglycemia: Secondary | ICD-10-CM | POA: Insufficient documentation

## 2016-02-25 DIAGNOSIS — Z7982 Long term (current) use of aspirin: Secondary | ICD-10-CM | POA: Insufficient documentation

## 2016-02-25 DIAGNOSIS — C9000 Multiple myeloma not having achieved remission: Secondary | ICD-10-CM | POA: Insufficient documentation

## 2016-02-25 DIAGNOSIS — L98498 Non-pressure chronic ulcer of skin of other sites with other specified severity: Secondary | ICD-10-CM | POA: Insufficient documentation

## 2016-02-25 DIAGNOSIS — Y839 Surgical procedure, unspecified as the cause of abnormal reaction of the patient, or of later complication, without mention of misadventure at the time of the procedure: Secondary | ICD-10-CM | POA: Diagnosis not present

## 2016-02-25 DIAGNOSIS — Z7902 Long term (current) use of antithrombotics/antiplatelets: Secondary | ICD-10-CM | POA: Diagnosis not present

## 2016-02-25 DIAGNOSIS — L03313 Cellulitis of chest wall: Secondary | ICD-10-CM | POA: Insufficient documentation

## 2016-02-25 DIAGNOSIS — T8131XD Disruption of external operation (surgical) wound, not elsewhere classified, subsequent encounter: Secondary | ICD-10-CM | POA: Insufficient documentation

## 2016-02-26 ENCOUNTER — Ambulatory Visit

## 2016-02-26 NOTE — Progress Notes (Signed)
[name] ([id number]) Visit Report for 02/25/2016 Chief Complaint Document Details Patient Name: [name], [name] Date of Service: 02/25/2016 9:15 AM Medical Record Patient Account Number: [id number] Number: Treating RN: [name] 1940-12-05 (75 y.o. Other Clinician: Date of Birth/Sex: Female) Treating [name] Primary Care Provider: Lavera [name] Provider/Extender: G Referring Provider: Sallee [name] in Treatment: 9 Information Obtained from: Patient Chief Complaint 12/24/15 patient is here for review of to nonhealing surgical wounds in a CABG scar Electronic Signature(s) Signed: 02/25/2016 5:24:26 PM By: [name] Ham MD Entered By: [name] on 02/25/2016 09:54:07 [name], [name] ([id number]) -------------------------------------------------------------------------------- HPI Details Patient Name: [name], [name] Date of Service: 02/25/2016 9:15 AM Medical Record Patient Account Number: [id number] Number: Treating RN: [name] [birth year] (75 y.o. Other Clinician: Date of Birth/Sex: Female) Treating [name] Primary Care Provider: Lavera [name] Provider/Extender: G Referring Provider: Sallee [name] in Treatment: 9 History of Present Illness HPI Description: 12/24/15; Mrs. Gross is a 76 year old woman who is the mother of one of our other clinic patient's [name] He comes [place] today for review a nonhealing surgical CABG wound. The patient underwent CABG o2 on October 31 17 at [facility]. She apparently had had a myocardial infarction in October but briefly presented with shortness of breath. She underwent surgery which I understand was uneventful. According to the patient her surgical incision never really healed in 2 spots. One is at the inferior recess question drain site, the other more superiorly about an inch from the superior surface of this it incision. She has been followed by her surgeon at [facility] who  felt that both areas which he'll according to the patient. She was put on Augmentin 2 weeks ago and she is finished a course of this culture at that time showed Escherichia coli which should've been sensitive. The patient also relates that she has a history of allergy to ampicillin. Besides this the patient has a history of type 2 diabetes on metformin, multiple myeloma. She had a chest x-ray in mid October the did not show any abnormalities. I cannot see that she had any specific views of the sternum however. We have her referral note from her primary physician Dr. Bernie [name]. She was concerned about erythema around the wound and as mentioned the culture here showed Escherichia coli which should have been Augmentin sensitive but was resistant to ampicillin, Keflex third generation cephalosporins quinolones and sulfonamides. [ESBL). The patient does not complain of fever or chills excessive pain. She does say that the lower surgical wound has been draining what looks to her like pus. She has been using triple antibiotic cream. 12/31/15 superior wound actually looks somewhat better. Inferiorly still a wound with some depth greater than 2 cm. Using Aquacel Ag. Culture I did of the drainage from the inferior wound and this surgical incision last week was negative 01/07/16; superior wound still requires debridement of surface eschar and nonviable subcutaneous tissue. Inferior wound measured at 1.8 cm. What I can see of the tissue here does not look very healthy. Original culture that I did on her presentation was negative however 01/27/16; the superior wound is closed she still has a deep probing wound of roughly 2 cm inferiorly. There is no overt infection no surrounding erythema. The patient tells me that 2 weeks ago she was developing chest pain and underwent a nuclear stress test. This was apparently negative however she developed chest pain shortly thereafter and had to go have a cardiac  catheterization that did not show blockages. Since then she is not having chest pain but has unrelenting nausea making it difficult for her to eat. We reviewed her medications and she is on aspirin and Plavix however she apparently has a stent in place as well. 02/03/16; still using silver alginate. Depth of this is down from 1.8 cm the circumference is smaller and the depth down by 2 mm red no drainage and no surrounding infection 02/10/16; still using silver alginate depth down to 1.3 cm today measured by myself. Appears to be making slow progress. There is no surrounding tenderness.. The patient states she has unrelenting nausea which is nonexertional. Not related to meals. She has had no vomiting. She also tells me she is had previous lap Frisbie, Albina A. ([id number]) band surgery but does not give clear reflux type symptoms 02/25/16; the wound orifice of this lady's wound on her chest is down to 1 cm in depth and much smaller in terms of the overall diameter. She tells me that she was packing the wound recently and developed quite a bit of bleeding that took several hours to stop she is on Plavix and aspirin. She is not having any exertional symptoms that sound like coronary artery disease. She is complaining of unrelenting nausea but she is being evaluated by GI for this and apparently has an endoscopy planned for March. We have been using iodoform packing Electronic Signature(s) Signed: 02/25/2016 5:24:26 PM By: [name] Ham MD Entered By: [name] on 02/25/2016 09:55:37 [name], [name] ([id number]) -------------------------------------------------------------------------------- Physical Exam Details Patient Name: [name], [name] Date of Service: 02/25/2016 9:15 AM Medical Record Patient Account Number: [id number] Number: Treating RN: [name] Jul 23, 1940 (75 y.o. Other Clinician: Date of Birth/Sex: Female) Treating [name] Primary Care Provider: Lavera [name] Provider/Extender: G Referring Provider: Sallee [name] in Treatment: 9 Constitutional Patient is hypertensive.. Pulse regular and within target range for patient.Marland Kitchen Respirations regular, non-labored and within target range.. Temperature is normal and within the target range for the patient.. Patient's appearance is neat and clean. Appears in no acute distress. Well nourished and well developed.Marland Kitchen Respiratory Respiratory effort is easy and symmetric bilaterally. Rate is normal at rest and on room air.. Cardiovascular Heart rhythm and rate regular, without murmur or gallop.Marland Kitchen Lymphatic Nonpalpable in the left supraclavicular or infraclavicular area. Notes Wound exam; 1 cm in depth which is an improvement. The orifice of the remaining wound is very tiny. There is no surrounding erythema and no tenderness. Electronic Signature(s) Signed: 02/25/2016 5:24:26 PM By: [name] Ham MD Entered By: [name] on 02/25/2016 09:56:55 [name], [name] ([id number]) -------------------------------------------------------------------------------- Physician Orders Details Patient Name: Osterberg, Vila A. Date of Service: 02/25/2016 9:15 AM Medical Record Patient Account Number: [id number] Number: Treating RN: [name] [birth year] (75 y.o. Other Clinician: Date of Birth/Sex: Female) Treating Donnalynn Wheeless Primary Care Provider: Lavera [name] Provider/Extender: G Referring Provider: Sallee [name] in Treatment: 9 Verbal / Phone Orders: No Diagnosis Coding Wound Cleansing Wound #2 Distal,Midline Chest o Clean wound with Normal Saline. o May Shower, gently pat wound dry prior to applying new dressing. Primary Wound Dressing Wound #2 Distal,Midline Chest o Iodoform packing Gauze Secondary Dressing Wound #2 Distal,Midline Chest o Dry Gauze o Other - telfa island Dressing Change Frequency Wound #2 Distal,Midline Chest o Change dressing every day. Follow-up  Appointments Wound #2 Distal,Midline Chest o Return Appointment in 2 weeks. Additional Orders / Instructions Wound #2 Distal,Midline Chest o Increase protein intake. Electronic Signature(s) Signed: 02/25/2016  5:24:26 PM By: [name] Ham MD Signed: 02/25/2016 7:45:01 PM By: Gretta Cool RN, BSN, Kim RN, BSN Entered By: Gretta Cool, RN, BSN, Kim on 02/25/2016 09:39:02 [name], [name] ([id number]) -------------------------------------------------------------------------------- Problem List Details Patient Name: Landini, Maly A. Date of Service: 02/25/2016 9:15 AM Medical Record Patient Account Number: [id number] Number: Treating RN: [name] 05/08/1940 (75 y.o. 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 ([id number]) -------------------------------------------------------------------------------- Progress Note Details Patient Name: [name], [name] Date of Service: 02/25/2016 9:15 AM Medical Record Patient Account Number: [id number] Number: Treating RN: [name] Jul 02, 1940 (75 y.o. Other Clinician: Date of Birth/Sex: Female) Treating Gearold Wainer Primary Care Provider: Lavera [name] Provider/Extender: G Referring Provider: Sallee [name] in Treatment: 9 Subjective Chief Complaint Information obtained from  Patient 12/24/15 patient is here for review of to nonhealing surgical wounds in a CABG scar History of Present Illness (HPI) 12/24/15; Mrs. Portlock is a 76 year old woman who is the mother of one of our other clinic patient's [name] Lasorsa. He comes [place] today for review a nonhealing surgical CABG wound. The patient underwent CABG o2 on October 31 17 at [facility]. She apparently had had a myocardial infarction in October but briefly presented with shortness of breath. She underwent surgery which I understand was uneventful. According to the patient her surgical incision never really healed in 2 spots. One is at the inferior recess question drain site, the other more superiorly about an inch from the superior surface of this it incision. She has been followed by her surgeon at [facility] who felt that both areas which he'll according to the patient. She was put on Augmentin 2 weeks ago and she is finished a course of this culture at that time showed Escherichia coli which should've been sensitive. The patient also relates that she has a history of allergy to ampicillin. Besides this the patient has a history of type 2 diabetes on metformin, multiple myeloma. She had a chest x-ray in mid October the did not show any abnormalities. I cannot see that she had any specific views of the sternum however. We have her referral note from her primary physician Dr. Bernie [name]. She was concerned about erythema around the wound and as mentioned the culture here showed Escherichia coli which should have been Augmentin sensitive but was resistant to ampicillin, Keflex third generation cephalosporins quinolones and sulfonamides. [ESBL). The patient does not complain of fever or chills excessive pain. She does say that the lower surgical wound has been draining what looks to her like pus. She has been using triple antibiotic cream. 12/31/15 superior wound actually looks somewhat better. Inferiorly still a  wound with some depth greater than 2 cm. Using Aquacel Ag. Culture I did of the drainage from the inferior wound and this surgical incision last week was negative 01/07/16; superior wound still requires debridement of surface eschar and nonviable subcutaneous tissue. Inferior wound measured at 1.8 cm. What I can see of the tissue here does not look very healthy. Original culture that I  did on her presentation was negative however 01/27/16; the superior wound is closed she still has a deep probing wound of roughly 2 cm inferiorly. There is no overt infection no surrounding erythema. The patient tells me that 2 weeks ago she was developing chest pain and underwent a nuclear stress test. This was apparently negative however she developed chest pain shortly thereafter and had to go have a cardiac catheterization that did not show blockages. Since then she is not having chest pain but has unrelenting nausea making it difficult for her to eat. We reviewed her [name], [name] ([id number]) medications and she is on aspirin and Plavix however she apparently has a stent in place as well. 02/03/16; still using silver alginate. Depth of this is down from 1.8 cm the circumference is smaller and the depth down by 2 mm red no drainage and no surrounding infection 02/10/16; still using silver alginate depth down to 1.3 cm today measured by myself. Appears to be making slow progress. There is no surrounding tenderness.. The patient states she has unrelenting nausea which is nonexertional. Not related to meals. She has had no vomiting. She also tells me she is had previous lap band surgery but does not give clear reflux type symptoms 02/25/16; the wound orifice of this lady's wound on her chest is down to 1 cm in depth and much smaller in terms of the overall diameter. She tells me that she was packing the wound recently and developed quite a bit of bleeding that took several hours to stop she is on Plavix and aspirin.  She is not having any exertional symptoms that sound like coronary artery disease. She is complaining of unrelenting nausea but she is being evaluated by GI for this and apparently has an endoscopy planned for March. We have been using iodoform packing Objective Constitutional Patient is hypertensive.. Pulse regular and within target range for patient.Marland Kitchen Respirations regular, non-labored and within target range.. Temperature is normal and within the target range for the patient.. Patient's appearance is neat and clean. Appears in no acute distress. Well nourished and well developed.. Vitals Time Taken: 9:12 AM, Height: 62 in, Weight: 161 lbs, BMI: 29.4, Temperature: 97.7 F, Pulse: 71 bpm, Respiratory Rate: 16 breaths/min, Blood Pressure: 161/44 mmHg. Respiratory Respiratory effort is easy and symmetric bilaterally. Rate is normal at rest and on room air.. Cardiovascular Heart rhythm and rate regular, without murmur or gallop.Marland Kitchen Lymphatic Nonpalpable in the left supraclavicular or infraclavicular area. General Notes: Wound exam; 1 cm in depth which is an improvement. The orifice of the remaining wound is very tiny. There is no surrounding erythema and no tenderness. Integumentary (Hair, Skin) Wound #2 status is Open. Original cause of wound was Surgical Injury. The wound is located on the Distal,Midline Chest. The wound measures 0.3cm length x 0.2cm width x 0.8cm depth; 0.047cm^2 area and 0.038cm^3 volume. [name], [name] 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 May Shower, gently pat wound dry prior to applying new dressing. Primary Wound Dressing: Wound #2  Distal,Midline Chest: Iodoform packing Gauze Secondary Dressing: Wound #2 Distal,Midline Chest: Dry Gauze Other - telfa island Dressing Change Frequency: Wound #2 Distal,Midline Chest: Change dressing every day. Follow-up Appointments: Wound #2 Distal,Midline Chest: Return Appointment in 2 weeks. Additional Orders /  Instructions: Wound #2 Distal,Midline Chest: Increase protein intake. no change to dressing iodofrom cardiac status seems stable [name], [name] ([id number]) advised her on pressure application if there is bleeding durng dressing changes Electronic Signature(s) Signed: 02/25/2016 5:24:26 PM By: [name] Ham MD Entered By: [name] on 02/25/2016 09:59:01 Kita, [name] ([id number]) -------------------------------------------------------------------------------- SuperBill Details Patient Name: Tupper, Jeanene A. Date of Service: 02/25/2016 Medical Record Patient Account Number: [id number] Number: Treating RN: [name] [birth year] (76 y.o. 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

## 2016-02-26 NOTE — Progress Notes (Signed)
[name] ([id number]) Visit Report for 02/25/2016 Arrival Information Details Patient Name: [name] Date of Service: 02/25/2016 9:15 AM Medical Record Patient Account Number: [id number] Number: Treating RN: [name] 1940/08/12 (75 y.o. Other Clinician: Date of Birth/Sex: Female) Treating [name] Primary Care [name]: [name]/Extender: [name]: [name] in Treatment: 9 Visit Information History Since Last Visit Added or deleted any medications: Yes Patient Arrived: Walker Any new allergies or adverse reactions: No Arrival Time: 09:09 Had [name] or experienced change in No Accompanied By: self activities of daily living that may affect Transfer Assistance: None risk of falls: Patient Identification Verified: Yes Signs or symptoms of abuse/neglect since last No Secondary Verification Process Completed: Yes visito Patient Requires Transmission-Based No Hospitalized since last visit: No Precautions: Has Dressing in Place as Prescribed: Yes Patient Has Alerts: Yes Pain Present Now: No Electronic Signature(s) Signed: 02/25/2016 7:45:01 PM By: [name] Cool, RN, [name], [name] Entered By: [name] Cool, RN, [name], [name] on 02/25/2016 09:12:27 [name] ([id number]) -------------------------------------------------------------------------------- Clinic Level of Care Assessment Details Patient Name: [name] Date of Service: 02/25/2016 9:15 AM Medical Record Patient Account Number: [id number] Number: Treating RN: [name] 05/18/1940 (75 y.o. Other Clinician: Date of Birth/Sex: Female) Treating [name] Primary Care Jalisia [name]: [name] [name] [name]/Extender: [name] Referring Tiburcio Linder: [name] in Treatment: 9 Clinic Level of Care Assessment Items TOOL 4 Quantity Score []  - Use when only an EandM is performed on FOLLOW-UP visit 0 ASSESSMENTS - Nursing Assessment / Reassessment X - Reassessment  of Co-morbidities (includes updates in patient status) 1 10 X - Reassessment of Adherence to Treatment Plan 1 5 ASSESSMENTS - Wound and Skin Assessment / Reassessment X - Simple Wound Assessment / Reassessment - one wound 1 5 []  - Complex Wound Assessment / Reassessment - multiple wounds 0 []  - Dermatologic / Skin Assessment (not related to wound area) 0 ASSESSMENTS - Focused Assessment []  - Circumferential Edema Measurements - multi extremities 0 []  - Nutritional Assessment / Counseling / Intervention 0 []  - Lower Extremity Assessment (monofilament, tuning fork, pulses) 0 []  - Peripheral Arterial Disease Assessment (using hand held doppler) 0 ASSESSMENTS - Ostomy and/or Continence Assessment and Care []  - Incontinence Assessment and Management 0 []  - Ostomy Care Assessment and Management (repouching, etc.) 0 PROCESS - Coordination of Care X - Simple Patient / Family Education for ongoing care 1 15 []  - Complex (extensive) Patient / Family Education for ongoing care 0 []  - Staff obtains Programmer, systems, Records, Test Results / Process Orders 0 []  - Staff telephones HHA, Nursing Homes / Clarify orders / etc 0 [name] ([id number]) []  - Routine Transfer to another Facility (non-emergent condition) 0 []  - Routine Hospital Admission (non-emergent condition) 0 []  - New Admissions / Biomedical engineer / Ordering NPWT, Apligraf, etc. 0 []  - Emergency Hospital Admission (emergent condition) 0 X - Simple Discharge Coordination 1 10 []  - Complex (extensive) Discharge Coordination 0 PROCESS - Special Needs []  - Pediatric / Minor Patient Management 0 []  - Isolation Patient Management 0 []  - Hearing / Language / Visual special needs 0 []  - Assessment of Community assistance (transportation, D/C planning, etc.) 0 []  - Additional assistance / Altered mentation 0 []  - Support Surface(s) Assessment (bed, cushion, seat, etc.) 0 INTERVENTIONS - Wound Cleansing / Measurement X - Simple Wound  Cleansing - one wound 1 5 []  - Complex Wound Cleansing - multiple wounds 0 X - Wound Imaging (photographs - any  number of wounds) 1 5 []  - Wound Tracing (instead of photographs) 0 X - Simple Wound Measurement - one wound 1 5 []  - Complex Wound Measurement - multiple wounds 0 INTERVENTIONS - Wound Dressings X - Small Wound Dressing one or multiple wounds 1 10 []  - Medium Wound Dressing one or multiple wounds 0 []  - Large Wound Dressing one or multiple wounds 0 []  - Application of Medications - topical 0 []  - Application of Medications - injection 0 [name] ([id number]) INTERVENTIONS - Miscellaneous []  - External ear exam 0 []  - Specimen Collection (cultures, biopsies, blood, body fluids, etc.) 0 []  - Specimen(s) / Culture(s) sent or taken to Lab for analysis 0 []  - Patient Transfer (multiple staff / Civil Service fast streamer / Similar devices) 0 []  - Simple Staple / Suture removal (25 or less) 0 []  - Complex Staple / Suture removal (26 or more) 0 []  - Hypo / Hyperglycemic Management (close monitor of Blood Glucose) 0 []  - Ankle / Brachial Index (ABI) - do not check if billed separately 0 X - Vital Signs 1 5 Has the patient been seen at the hospital within the last three years: Yes Total Score: 75 Level Of Care: New/Established - Level 2 Electronic Signature(s) Signed: 02/25/2016 7:45:01 PM By: [name] Cool, RN, [name], [name] Entered By: [name] Cool, RN, [name], [name] on 02/25/2016 09:45:10 [name] ([id number]) -------------------------------------------------------------------------------- Encounter Discharge Information Details Patient Name: [name] Date of Service: 02/25/2016 9:15 AM Medical Record Patient Account Number: [id number] Number: Treating RN: [name] 1940/06/11 (75 y.o. Other Clinician: Date of Birth/Sex: Female) Treating [name] Primary Care [name]: [name] [name] [name]/Extender: [name] Referring [name]: [name] in Treatment: 9 Encounter  Discharge Information Items Discharge Pain Level: 0 Discharge Condition: Stable Ambulatory Status: Walker Discharge Destination: Home Private Transportation: Auto Accompanied By: self Schedule Follow-up Appointment: Yes Medication Reconciliation completed and Yes provided to Patient/Care Dreamer Carillo: Clinical Summary of Care: Electronic Signature(s) Signed: 02/25/2016 7:45:01 PM By: [name] Cool RN, [name], [name] Previous Signature: 02/25/2016 9:45:41 AM Version By: Ruthine Dose Entered By: [name] Cool RN, [name], [name] on 02/25/2016 09:45:48 Sterkel, Carolene [name]. ([id number]) -------------------------------------------------------------------------------- Multi Wound Chart Details Patient Name: [name] Date of Service: 02/25/2016 9:15 AM Medical Record Patient Account Number: [id number] Number: Treating RN: [name] 01-06-1941 (75 y.o. Other Clinician: Date of Birth/Sex: Female) Treating [name] Primary Care Terryann Verbeek: [name] [name] Nicolet Griffy/Extender: [name] Referring Rannie Craney: [name] in Treatment: 9 Vital Signs Height(in): 62 Pulse(bpm): 71 Weight(lbs): 161 Blood Pressure 161/44 (mmHg): Body Mass Index(BMI): 29 Temperature(F): 97.7 Respiratory Rate 16 (breaths/min): Photos: [2:No Photos] [N/[name]:N/[name]] Wound Location: [2:Distal, Midline Chest] [N/[name]:N/[name]] Wounding Event: [2:Surgical Injury] [N/[name]:N/[name]] Primary Etiology: [2:Open Surgical Wound] [N/[name]:N/[name]] Date Acquired: [2:11/11/2015] [N/[name]:N/[name]] Weeks of Treatment: [2:8] [N/[name]:N/[name]] Wound Status: [2:Open] [N/[name]:N/[name]] Measurements L x W x D 0.3x0.2x0.8 [N/[name]:N/[name]] (cm) Area (cm) : [2:0.047] [N/[name]:N/[name]] Volume (cm) : [2:0.038] [N/[name]:N/[name]] % Reduction in Area: [2:82.90%] [N/[name]:N/[name]] % Reduction in Volume: 93.40% [N/[name]:N/[name]] Classification: [2:Full Thickness Without Exposed Support Structures] [N/[name]:N/[name]] Periwound Skin Texture: No Abnormalities Noted [N/[name]:N/[name]] Periwound Skin [2:No Abnormalities Noted]  [N/[name]:N/[name]] Moisture: Periwound Skin Color: No Abnormalities Noted [N/[name]:N/[name]] Tenderness on [2:No] [N/[name]:N/[name]] Treatment Notes Wound #2 (Distal, Midline Chest) 1. Cleansed with: Clean wound with Normal Saline Tate, Tahliyah [name]. ([id number]) 4. Dressing Applied: Iodoform packing Gauze 5. Secondary Dressing Applied Dry Petros Signature(s) Signed: 02/25/2016 5:24:26 PM By: [name] MD Entered By: [name] on 02/25/2016 09:53:48 Borboa, Reah [name]. ([id number]) --------------------------------------------------------------------------------  Multi-Disciplinary Care Plan Details Patient Name: [name] Date of Service: 02/25/2016 9:15 AM Medical Record Patient Account Number: [id number] Number: Treating RN: [name] 1940/12/30 (75 y.o. Other Clinician: Date of Birth/Sex: Female) Treating [name] Primary Care Scotty Weigelt: [name] [name] Laylee Schooley/Extender: [name] Referring Otie Headlee: [name] in Treatment: 9 Active Inactive ` Abuse / Safety / Falls / Self Care Management Nursing Diagnoses: Impaired physical mobility Potential for falls Goals: Patient will remain injury free Date Initiated: 12/24/2015 Target Resolution Date: 02/10/2016 Goal Status: Active Interventions: Assess fall risk on admission and as needed Notes: ` Orientation to the Wound Care Program Nursing Diagnoses: Knowledge deficit related to the wound healing center program Goals: Patient/caregiver will verbalize understanding of the [place] Date Initiated: 12/24/2015 Target Resolution Date: 02/10/2016 Goal Status: Active Interventions: Provide education on orientation to the wound center Notes: ` Wound/Skin Impairment [name] [name] ([id number]) Nursing Diagnoses: Impaired tissue integrity Goals: Patient/caregiver will verbalize understanding of skin care regimen Date Initiated: 12/24/2015 Target Resolution Date: 02/10/2016 Goal  Status: Active Ulcer/skin breakdown will have [name] volume reduction of 30% by week 4 Date Initiated: 12/24/2015 Target Resolution Date: 02/10/2016 Goal Status: Active Ulcer/skin breakdown will have [name] volume reduction of 50% by week 8 Date Initiated: 12/24/2015 Target Resolution Date: 02/24/2016 Goal Status: Active Ulcer/skin breakdown will have [name] volume reduction of 80% by week 12 Date Initiated: 12/24/2015 Target Resolution Date: 03/23/2016 Goal Status: Active Ulcer/skin breakdown will heal within 14 weeks Date Initiated: 12/24/2015 Target Resolution Date: 04/06/2016 Goal Status: Active Interventions: Assess patient/caregiver ability to obtain necessary supplies Assess patient/caregiver ability to perform ulcer/skin care regimen upon admission and as needed Assess ulceration(s) every visit Notes: Electronic Signature(s) Signed: 02/25/2016 7:45:01 PM By: [name] Cool, RN, [name], [name] Entered By: [name] Cool, RN, [name], [name] on 02/25/2016 09:19:30 [name] ([id number]) -------------------------------------------------------------------------------- Pain Assessment Details Patient Name: [name] Date of Service: 02/25/2016 9:15 AM Medical Record Patient Account Number: [id number] Number: Treating RN: [name] January 18, 1940 (75 y.o. Other Clinician: Date of Birth/Sex: Female) Treating [name] Primary Care Malina Geers: [name] [name] Mazal Ebey/Extender: [name] Referring Regginald Pask: [name] in Treatment: 9 Active Problems Location of Pain Severity and Description of Pain Patient Has Paino No Site Locations With Dressing Change: No Pain Management and Medication Current Pain Management: Electronic Signature(s) Signed: 02/25/2016 7:45:01 PM By: [name] Cool, RN, [name], [name] Entered By: [name] Cool, RN, [name], [name] on 02/25/2016 09:12:34 [name], [name] ([id number]) -------------------------------------------------------------------------------- Patient/Caregiver Education  Details Patient Name: Munns, Zakyla [name]. Date of Service: 02/25/2016 9:15 AM Medical Record Patient Account Number: [id number] Number: Treating RN: [name] [birth year] (75 y.o. Other Clinician: Date of Birth/Gender: Female) Treating [name] Primary Care Physician: [name] [name] Physician/Extender: [name] Referring Physician: [name] in Treatment: 9 Education Assessment Education Provided To: Patient Education Topics Provided Wound/Skin Impairment: Handouts: Caring for Your Ulcer, Other: do not over pack the wound Methods: Demonstration, Explain/Verbal Responses: State content correctly Electronic Signature(s) Signed: 02/25/2016 7:45:01 PM By: [name] Cool, RN, [name], [name] Entered By: [name] Cool, RN, [name], [name] on 02/25/2016 09:46:06 [name], [name] ([id number]) -------------------------------------------------------------------------------- Wound Assessment Details Patient Name: [name] Date of Service: 02/25/2016 9:15 AM Medical Record Patient Account Number: [id number] Number: Treating RN: [name] [birth year] (75 y.o. Other Clinician: Date of Birth/Sex: Female) Treating [name] Primary Care Yer Olivencia: [name] [name] Rashel Okeefe/Extender: [name] Referring Makella Buckingham: [name] in Treatment: 9 Wound Status Wound Number: 2 Primary Etiology: Open Surgical Wound Wound Location:  Distal, Midline Chest Wound Status: Open Wounding Event: Surgical Injury Date Acquired: 11/11/2015 Weeks Of Treatment: 8 Clustered Wound: No Photos Photo Uploaded By: [name] Cool, RN, [name], [name] on 02/26/2016 16:19:52 Wound Measurements Length: (cm) 0.3 Width: (cm) 0.2 Depth: (cm) 0.8 Area: (cm) 0.047 Volume: (cm) 0.038 % Reduction in Area: 82.9% % Reduction in Volume: 93.4% Wound Description Full Thickness Without Exposed Classification: Support Structures Periwound Skin Texture Texture Color No Abnormalities Noted: No No Abnormalities Noted:  No Moisture No Abnormalities Noted: No Treatment Notes Wound #2 (Distal, Midline Chest) [name] ([id number]) 1. Cleansed with: Clean wound with Normal Saline 4. Dressing Applied: Iodoform packing Gauze 5. Secondary [place] Signature(s) Signed: 02/25/2016 7:45:01 PM By: [name] Cool, RN, [name], [name] Entered By: [name] Cool, RN, [name], [name] on 02/25/2016 09:18:38 Beedle, [name] ([id number]) -------------------------------------------------------------------------------- Vitals Details Patient Name: Wagman, [name] Date of Service: 02/25/2016 9:15 AM Medical Record Patient Account Number: [id number] Number: Treating RN: [name] 19-Nov-1940 (75 y.o. Other Clinician: Date of Birth/Sex: Female) Treating [name] Primary Care Lamir Racca: [name] [name] Stephano Arrants/Extender: [name] Referring Keyana Guevara: [name] in Treatment: 9 Vital Signs Time Taken: 09:12 Temperature (F): 97.7 Height (in): 62 Pulse (bpm): 71 Weight (lbs): 161 Respiratory Rate (breaths/min): 16 Body Mass Index (BMI): 29.4 Blood Pressure (mmHg): 161/44 Reference Range: 80 - 120 mg / dl Electronic Signature(s) Signed: 02/25/2016 7:45:01 PM By: [name] Cool, RN, [name], [name] Entered By: [name] Cool, RN, [name], [name] on 02/25/2016 09:12:50

## 2016-02-27 ENCOUNTER — Ambulatory Visit (INDEPENDENT_AMBULATORY_CARE_PROVIDER_SITE_OTHER): Payer: Medicare Other | Admitting: Pharmacist

## 2016-02-27 VITALS — BP 114/58 | HR 75 | Ht 62.0 in | Wt 155.5 lb

## 2016-02-27 DIAGNOSIS — E785 Hyperlipidemia, unspecified: Secondary | ICD-10-CM

## 2016-02-27 DIAGNOSIS — I2510 Atherosclerotic heart disease of native coronary artery without angina pectoris: Secondary | ICD-10-CM

## 2016-02-27 NOTE — Progress Notes (Signed)
Patient ID: [name]                 DOB: [birth year]                    MRN: [id number]     HPI: [name] is a 76 y.o. female patient referred to lipid clinic by Dr Meda Coffee. PMH is significant for CAD s/p NSTEMI in May 2017 with DES placed to the mid and distal LAD as well as OM2, CABG x2 vessels in October 2017, DM, HTN, unstable angina, PAD s/p L fem pop in 2013, stroke, multiple myeloma, and ischemic cardiomyopathy. LHC on 01/15/16 showed 70% stenosis of the distal left main, diffuse 90% stenosis to proximal LAD, and 80% restenosis of proximal segment of mid LAD.  Patient has an extensive list of intolerances to statin medications. She does not recall specific doses since her PCP tried her on most of the statin medications about 10 years ago. She has tried simvastatin, atorvastatin, rosuvastatin, pravastatin, and Livalo. She tolerated the Lipitor best for multiple years but ultimately still had side effects. She experienced severe joint aches in her shoulders with each of the statins. Most recently, her old cardiologist tried her on Zetia 53m daily but she still experienced myalgias.  Current Medications: none Intolerances: simvastatin, atorvastatin, rosuvastatin, pravastatin, Livalo, Zetia - severe joint pain Risk Factors: CAD s/p NSTEMI, CABG, unstable angina, stroke, PAD, DM2, HTN LDL goal: <756mdL  Diet: Had weight loss surgery in 2007 - lap band. Can only eat 2oz at a time. Protein and salad or veggies.   Exercise: Heart Track program after her bypass surgery.  Family History: DM and HF in her mother.  Social History: Denies tobacco, alcohol, and illicit drug use.  Labs: 01/14/16: TC 207, TG 214, HDL 31, LDL 133 (no therapy)  Past Medical History:  Diagnosis Date  . Anginal pain ([facility]  . Anxiety   . Arthritis    "shoulders primarily" (01/13/2016)  . CHF (congestive heart failure) ([facility] [place]  . Chronic pain    from her multiple myeloma but says that her pain is  usually in her legs/notes 01/13/2016  . High cholesterol   . History of blood transfusion    "numerous; related to procedures for my heart" (01/13/2016)  . Hypertension   . Ischemic cardiomyopathy    /nots 01/13/2016  . MI (myocardial infarction)    EKG on arrival 01/13/2016 showed NSR with evidence of old anterior and inferior infarcts  . MI (myocardial infarction) 08/2015  . Multiple myeloma ([facility] 2015  . PAD (peripheral artery disease) ([facility]  . [facility]   "several in 1 year; ? year" (01/13/2016)  . Type II diabetes mellitus ([facility] [place]    Current Outpatient Prescriptions on File Prior to Visit  Medication Sig Dispense Refill  . aspirin 81 MG tablet Take 81 mg by mouth daily.    . Cholecalciferol (VITAMIN D3) 50000 units CAPS Take 1 capsule by mouth 2 (two) times a week.    . clopidogrel (PLAVIX) 75 MG tablet Take 75 mg by mouth daily.    . diclofenac sodium (VOLTAREN) 1 % GEL Apply 2 g topically daily as needed for pain.    . fentaNYL (DURAGESIC - DOSED MCG/HR) 50 MCG/HR Place 50 mcg onto the skin every 3 (three) days.    . Marland Kitchenabapentin (NEURONTIN) 300 MG capsule Take 300-600 mg by mouth See admin instructions. Pt takes 60011mn am, 300m79m  bedtime    . hydrocortisone cream 1 % Apply topically as needed for itching. 30 g 0  . HYDROmorphone (DILAUDID) 2 MG tablet Take 2 mg by mouth every 4 (four) hours as needed for severe pain.    . isosorbide mononitrate (IMDUR) 30 MG 24 hr tablet Take 1 tablet (30 mg total) by mouth daily. 90 tablet 3  . lisinopril (PRINIVIL,ZESTRIL) 5 MG tablet Take 1 tablet (5 mg total) by mouth daily. 30 tablet 12  . metFORMIN (GLUCOPHAGE) 500 MG tablet Take 1,000 mg by mouth 2 (two) times daily with a meal.     . metoprolol tartrate (LOPRESSOR) 25 MG tablet Take 0.5 tablets (12.5 mg total) by mouth 2 (two) times daily. 90 tablet 3  . nitroGLYCERIN (NITROSTAT) 0.4 MG SL tablet Place 1 tablet (0.4 mg total) under the tongue every 5 (five) minutes as needed for chest  pain. (Patient not taking: Reported on 02/24/2016) 25 tablet 2  . ondansetron (ZOFRAN-ODT) 4 MG disintegrating tablet Take 4 mg by mouth every 8 (eight) hours as needed for nausea or vomiting.    . senna-docusate (SENOKOT-S) 8.6-50 MG tablet Take 2 tablets by mouth 2 (two) times daily as needed for constipation.     No current facility-administered medications on file prior to visit.     Allergies  Allergen Reactions  . Other Swelling    Smoke--Head swelling  . Amoxicillin   . Codeine   . Duloxetine Nausea Only  . Statins Swelling  . Sulfa Antibiotics     Assessment/Plan:  1. Hyperlipidemia - LDL 131 above goal <69m/dL given extensive history of ASCVD including CAD s/p MI and CABG, stroke, and PAD. Pt is intolerant to 5 statins and Zetia, experiencing severe joint pain with each of these. PCSK9i are the only option to bring LDL to goal. Will submit prior authorization for Praluent. Pt has filled out patient assistance paperwork to see if [employer] will help with copays. She will email proof of out of pocket costs required for patient assistance.  Patient signed informed consent for [facility] lipid registry.   [name], PharmD, CPP, [id number] [place] [id number]: [phone] Fax: ([phone]/16/2018 11:12 AM

## 2016-02-27 NOTE — Patient Instructions (Signed)
I will start the paperwork process for Praluent injections.  Once you have proof of out of pocket costs, email them to: [email or web address]

## 2016-03-02 ENCOUNTER — Ambulatory Visit

## 2016-03-03 ENCOUNTER — Encounter: Payer: Medicare Other | Admitting: Internal Medicine

## 2016-03-03 DIAGNOSIS — L98498 Non-pressure chronic ulcer of skin of other sites with other specified severity: Secondary | ICD-10-CM | POA: Diagnosis not present

## 2016-03-04 ENCOUNTER — Ambulatory Visit

## 2016-03-04 NOTE — Progress Notes (Signed)
[name] ([id number]) Visit Report for 03/03/2016 Arrival Information Details Patient Name: [name], [name] Date of Service: 03/03/2016 1:30 PM Medical Record Patient Account Number: [id number] Number: Treating RN: Montey Hora 06-Jul-1940 (76 y.o. Other Clinician: Date of Birth/Sex: Female) Treating ROBSON, MICHAEL Primary Care Cleophas Yoak: Lavera [name] Tyanna Hach/Extender: G Referring Makinsey Pepitone: [name] in Treatment: 10 Visit Information History Since Last Visit Added or deleted any medications: No Patient Arrived: [name] Any new allergies or adverse reactions: No Arrival Time: 13:42 Had a fall or experienced change in No Accompanied By: self activities of daily living that may affect Transfer Assistance: None risk of falls: Patient Identification Verified: Yes Signs or symptoms of abuse/neglect since last No Secondary Verification Process Completed: Yes visito Patient Requires Transmission-Based No Hospitalized since last visit: No Precautions: Has Dressing in Place as Prescribed: Yes Patient Has Alerts: Yes Pain Present Now: No Electronic Signature(s) Signed: 03/03/2016 5:23:30 PM By: Montey Hora Entered By: Montey Hora on 03/03/2016 13:46:13 Kulish, Arzella A. ([id number]) -------------------------------------------------------------------------------- Clinic Level of Care Assessment Details Patient Name: Knudtson, [name] Date of Service: 03/03/2016 1:30 PM Medical Record Patient Account Number: [id number] Number: Treating RN: Montey Hora May 12, 1940 (76 y.o. Other Clinician: Date of Birth/Sex: Female) Treating ROBSON, [place] Primary Care Mija Effertz: Lavera [name] Abbigal Radich/Extender: G Referring Rhone Ozaki: [name] in Treatment: 10 Clinic Level of Care Assessment Items TOOL 4 Quantity Score []  - Use when only an EandM is performed on FOLLOW-UP visit 0 ASSESSMENTS - Nursing Assessment / Reassessment X - Reassessment of  Co-morbidities (includes updates in patient status) 1 10 X - Reassessment of Adherence to Treatment Plan 1 5 ASSESSMENTS - Wound and Skin Assessment / Reassessment X - Simple Wound Assessment / Reassessment - one wound 1 5 []  - Complex Wound Assessment / Reassessment - multiple wounds 0 []  - Dermatologic / Skin Assessment (not related to wound area) 0 ASSESSMENTS - Focused Assessment []  - Circumferential Edema Measurements - multi extremities 0 []  - Nutritional Assessment / Counseling / Intervention 0 []  - Lower Extremity Assessment (monofilament, tuning fork, pulses) 0 []  - Peripheral Arterial Disease Assessment (using hand held doppler) 0 ASSESSMENTS - Ostomy and/or Continence Assessment and Care []  - Incontinence Assessment and Management 0 []  - Ostomy Care Assessment and Management (repouching, etc.) 0 PROCESS - Coordination of Care X - Simple Patient / Family Education for ongoing care 1 15 []  - Complex (extensive) Patient / Family Education for ongoing care 0 []  - Staff obtains Programmer, systems, Records, Test Results / Process Orders 0 []  - Staff telephones HHA, Nursing Homes / Clarify orders / etc 0 Hinnenkamp, Mirha A. ([id number]) []  - Routine Transfer to another Facility (non-emergent condition) 0 []  - Routine Hospital Admission (non-emergent condition) 0 []  - New Admissions / Biomedical engineer / Ordering NPWT, Apligraf, etc. 0 []  - Emergency Hospital Admission (emergent condition) 0 X - Simple Discharge Coordination 1 10 []  - Complex (extensive) Discharge Coordination 0 PROCESS - Special Needs []  - Pediatric / Minor Patient Management 0 []  - Isolation Patient Management 0 []  - Hearing / Language / Visual special needs 0 []  - Assessment of Community assistance (transportation, D/C planning, etc.) 0 []  - Additional assistance / Altered mentation 0 []  - Support Surface(s) Assessment (bed, cushion, seat, etc.) 0 INTERVENTIONS - Wound Cleansing / Measurement X - Simple Wound Cleansing  - one wound 1 5 []  - Complex Wound Cleansing - multiple wounds 0 X - Wound Imaging (photographs - any number of wounds) 1 5 []  -  Wound Tracing (instead of photographs) 0 X - Simple Wound Measurement - one wound 1 5 []  - Complex Wound Measurement - multiple wounds 0 INTERVENTIONS - Wound Dressings X - Small Wound Dressing one or multiple wounds 1 10 []  - Medium Wound Dressing one or multiple wounds 0 []  - Large Wound Dressing one or multiple wounds 0 []  - Application of Medications - topical 0 []  - Application of Medications - injection 0 Munn, Zauria A. ([id number]) INTERVENTIONS - Miscellaneous []  - External ear exam 0 []  - Specimen Collection (cultures, biopsies, blood, body fluids, etc.) 0 []  - Specimen(s) / Culture(s) sent or taken to Lab for analysis 0 []  - Patient Transfer (multiple staff / Harrel Lemon Lift / Similar devices) 0 []  - Simple Staple / Suture removal (25 or less) 0 []  - Complex Staple / Suture removal (26 or more) 0 []  - Hypo / Hyperglycemic Management (close monitor of Blood Glucose) 0 []  - Ankle / Brachial Index (ABI) - do not check if billed separately 0 X - Vital Signs 1 5 Has the patient been seen at the hospital within the last three years: Yes Total Score: 75 Level Of Care: New/Established - Level 2 Electronic Signature(s) Signed: 03/03/2016 5:23:30 PM By: Montey Hora Entered By: Montey Hora on 03/03/2016 14:05:09 Montesano, Audra A. ([id number]) -------------------------------------------------------------------------------- Encounter Discharge Information Details Patient Name: Buley, [name] Date of Service: 03/03/2016 1:30 PM Medical Record Patient Account Number: [id number] Number: Treating RN: Montey Hora November 09, 1940 (76 y.o. Other Clinician: Date of Birth/Sex: Female) Treating ROBSON, MICHAEL Primary Care Khaiden Segreto: Lavera [name] Loyce Klasen/Extender: G Referring Amos Gaber: [name] in Treatment: 10 Encounter Discharge Information  Items Discharge Pain Level: 0 Discharge Condition: Stable Ambulatory Status: [name] Discharge Destination: Home Transportation: Private Auto Accompanied By: self Schedule Follow-up Appointment: Yes Medication Reconciliation completed No and provided to Patient/Care Trayce Caravello: Provided on Clinical Summary of Care: 03/03/2016 Form Type Recipient Paper Patient AW Electronic Signature(s) Signed: 03/03/2016 4:34:09 PM By: Montey Hora Previous Signature: 03/03/2016 2:18:04 PM Version By: Ruthine Dose Entered By: Montey Hora on 03/03/2016 16:34:09 Ekstrand, Karee A. ([id number]) -------------------------------------------------------------------------------- Multi Wound Chart Details Patient Name: Zuleta, Triston A. Date of Service: 03/03/2016 1:30 PM Medical Record Patient Account Number: [id number] Number: Treating RN: Montey Hora 1940/12/23 (75 y.o. Other Clinician: Date of Birth/Sex: Female) Treating ROBSON, MICHAEL Primary Care Teaghan Melrose: Lavera [name] Alaiyah Bollman/Extender: G Referring Travoris Bushey: [name] in Treatment: 10 Vital Signs Height(in): 62 Pulse(bpm): 79 Weight(lbs): 161 Blood Pressure 122/45 (mmHg): Body Mass Index(BMI): 29 Temperature(F): 97.0 Respiratory Rate 16 (breaths/min): Photos: [N/A:N/A] Wound Location: Chest - Midline, Distal N/A N/A Wounding Event: Surgical Injury N/A N/A Primary Etiology: Open Surgical Wound N/A N/A Comorbid History: Cataracts, Anemia, N/A N/A Congestive Heart Failure, Coronary Artery Disease, Hypertension, Type II Diabetes, Osteoarthritis, Neuropathy, Received Chemotherapy Date Acquired: 11/11/2015 N/A N/A Weeks of Treatment: 9 N/A N/A Wound Status: Open N/A N/A Measurements L x W x D 0.2x0.2x1.2 N/A N/A (cm) Area (cm) : 0.031 N/A N/A Volume (cm) : 0.038 N/A N/A % Reduction in Area: 88.70% N/A N/A % Reduction in Volume: 93.40% N/A N/A Classification: N/A N/A Braddy, Chynah A. ([id number]) Full  Thickness Without Exposed Support Structures Exudate Amount: Large N/A N/A Exudate Type: Sanguinous N/A N/A Exudate Color: red N/A N/A Wound Margin: Flat and Intact N/A N/A Granulation Amount: Large (67-100%) N/A N/A Granulation Quality: Red N/A N/A Necrotic Amount: None Present (0%) N/A N/A Exposed Structures: Fascia: No N/A N/A Fat Layer (Subcutaneous Tissue) Exposed: No Tendon: No Muscle:  No Joint: No Bone: No Limited to Skin Breakdown Epithelialization: None N/A N/A Periwound Skin Texture: Excoriation: No N/A N/A Induration: No Callus: No Crepitus: No Rash: No Scarring: No Periwound Skin Maceration: No N/A N/A Moisture: Dry/Scaly: No Periwound Skin Color: Atrophie Blanche: No N/A N/A Cyanosis: No Ecchymosis: No Erythema: No Hemosiderin Staining: No Mottled: No Pallor: No Rubor: No Temperature: No Abnormality N/A N/A Tenderness on No N/A N/A Palpation: Wound Preparation: Ulcer Cleansing: N/A N/A Rinsed/Irrigated with Saline Topical Anesthetic Applied: None Treatment Notes Electronic Signature(s) LILYANAH, MARTORELLA ([id number]) Signed: 03/03/2016 4:59:22 PM By: [name] MD Entered By: [name] on 03/03/2016 14:33:44 Bram, Valree AMarland Kitchen ([id number]) -------------------------------------------------------------------------------- [place] Details Patient Name: Teska, Makailah A. Date of Service: 03/03/2016 1:30 PM Medical Record Patient Account Number: [id number] Number: Treating RN: Montey Hora 1940-07-17 (75 y.o. Other Clinician: Date of Birth/Sex: Female) Treating ROBSON, MICHAEL Primary Care Debroh Sieloff: Lavera [name] Sunday Klos/Extender: G Referring Thuan Tippett: [name] in Treatment: 10 Active Inactive ` Abuse / Safety / Falls / Self Care Management Nursing Diagnoses: Impaired physical mobility Potential for falls Goals: Patient will remain injury free Date Initiated: 12/24/2015 Target Resolution Date:  02/10/2016 Goal Status: Active Interventions: Assess fall risk on admission and as needed Notes: ` Orientation to the Wound Care Program Nursing Diagnoses: Knowledge deficit related to the wound healing center program Goals: Patient/caregiver will verbalize understanding of the [facility] Date Initiated: 12/24/2015 Target Resolution Date: 02/10/2016 Goal Status: Active Interventions: Provide education on orientation to the wound center Notes: ` Wound/Skin Impairment Effinger, Verlyn A. ([id number]) Nursing Diagnoses: Impaired tissue integrity Goals: Patient/caregiver will verbalize understanding of skin care regimen Date Initiated: 12/24/2015 Target Resolution Date: 02/10/2016 Goal Status: Active Ulcer/skin breakdown will have a volume reduction of 30% by week 4 Date Initiated: 12/24/2015 Target Resolution Date: 02/10/2016 Goal Status: Active Ulcer/skin breakdown will have a volume reduction of 50% by week 8 Date Initiated: 12/24/2015 Target Resolution Date: 02/24/2016 Goal Status: Active Ulcer/skin breakdown will have a volume reduction of 80% by week 12 Date Initiated: 12/24/2015 Target Resolution Date: 03/23/2016 Goal Status: Active Ulcer/skin breakdown will heal within 14 weeks Date Initiated: 12/24/2015 Target Resolution Date: 04/06/2016 Goal Status: Active Interventions: Assess patient/caregiver ability to obtain necessary supplies Assess patient/caregiver ability to perform ulcer/skin care regimen upon admission and as needed Assess ulceration(s) every visit Notes: Electronic Signature(s) Signed: 03/03/2016 5:23:30 PM By: Montey Hora Entered By: Montey Hora on 03/03/2016 14:04:00 Gittings, Phyllis A. ([id number]) -------------------------------------------------------------------------------- Pain Assessment Details Patient Name: Loudermilk, Ladona A. Date of Service: 03/03/2016 1:30 PM Medical Record Patient Account Number:  [id number] Number: Treating RN: Montey Hora 1940/08/15 (75 y.o. Other Clinician: Date of Birth/Sex: Female) Treating ROBSON, MICHAEL Primary Care Abigaile Rossie: Lavera [name] Kahliya Fraleigh/Extender: G Referring Deiona Hooper: [name] in Treatment: 10 Active Problems Location of Pain Severity and Description of Pain Patient Has Paino No Site Locations Pain Management and Medication Current Pain Management: Notes Topical or injectable lidocaine is offered to patient for acute pain when surgical debridement is performed. If needed, Patient is instructed to use over the counter pain medication for the following 24-48 hours after debridement. Wound care MDs do not prescribed pain medications. Patient has chronic pain or uncontrolled pain. Patient has been instructed to make an appointment with their Primary Care Physician for pain management. Electronic Signature(s) Signed: 03/03/2016 5:23:30 PM By: Montey Hora Entered By: Montey Hora on 03/03/2016 13:46:23 Campoverde, [name] ([id number]) -------------------------------------------------------------------------------- Patient/Caregiver Education Details Patient Name: Borello, Melitza A. Date of  Service: 03/03/2016 1:30 PM Medical Record Patient Account Number: [id number] Number: Treating RN: Montey Hora 04-28-1940 (75 y.o. Other Clinician: Date of Birth/Gender: Female) Treating ROBSON, MICHAEL Primary Care Physician: Lavera [name] Physician/Extender: G Referring Physician: [name] in Treatment: 10 Education Assessment Education Provided To: Patient Education Topics Provided Wound/Skin Impairment: Handouts: Other: wound care as ordered Methods: Demonstration, Explain/Verbal Responses: State content correctly Electronic Signature(s) Signed: 03/03/2016 5:23:30 PM By: Montey Hora Entered By: Montey Hora on 03/03/2016 16:34:25 Wolden, Donn A.  ([id number]) -------------------------------------------------------------------------------- Wound Assessment Details Patient Name: Yuan, Kortne A. Date of Service: 03/03/2016 1:30 PM Medical Record Patient Account Number: [id number] Number: Treating RN: Montey Hora [birth year] (75 y.o. Other Clinician: Date of Birth/Sex: Female) Treating ROBSON, MICHAEL Primary Care Sulay Brymer: Lavera [name] Lynzee Lindquist/Extender: G Referring Raynell Scott: [name] in Treatment: 10 Wound Status Wound Number: 2 Primary Open Surgical Wound Etiology: Wound Location: Chest - Midline, Distal Wound Open Wounding Event: Surgical Injury Status: Date Acquired: 11/11/2015 Comorbid Cataracts, Anemia, Congestive Heart Weeks Of Treatment: 9 History: Failure, Coronary Artery Disease, Clustered Wound: No Hypertension, Type II Diabetes, Osteoarthritis, Neuropathy, Received Chemotherapy Photos Wound Measurements Length: (cm) 0.2 Width: (cm) 0.2 Depth: (cm) 1.2 Area: (cm) 0.031 Volume: (cm) 0.038 % Reduction in Area: 88.7% % Reduction in Volume: 93.4% Epithelialization: None Tunneling: No Undermining: No Wound Description Full Thickness Without Exposed Foul Odor After C Classification: Support Structures Slough/Fibrino Wound Margin: Flat and Intact Exudate Large Amount: Exudate Type: Sanguinous Exudate Color: red Crookshanks, Awilda A. ([id number]) leansing: No No Wound Bed Granulation Amount: Large (67-100%) Exposed Structure Granulation Quality: Red Fascia Exposed: No Necrotic Amount: None Present (0%) Fat Layer (Subcutaneous Tissue) Exposed: No Tendon Exposed: No Muscle Exposed: No Joint Exposed: No Bone Exposed: No Limited to Skin Breakdown Periwound Skin Texture Texture Color No Abnormalities Noted: No No Abnormalities Noted: No Callus: No Atrophie Blanche: No Crepitus: No Cyanosis: No Excoriation: No Ecchymosis: No Induration: No Erythema: No Rash: No Hemosiderin  Staining: No Scarring: No Mottled: No Pallor: No Moisture Rubor: No No Abnormalities Noted: No Dry / Scaly: No Temperature / Pain Maceration: No Temperature: No Abnormality Wound Preparation Ulcer Cleansing: Rinsed/Irrigated with Saline Topical Anesthetic Applied: None Treatment Notes Wound #2 (Distal, Midline Chest) 1. Cleansed with: Clean wound with Normal Saline 4. Dressing Applied: Iodoform packing Gauze 5. Secondary [place] Signature(s) Signed: 03/03/2016 5:23:30 PM By: Montey Hora Entered By: Montey Hora on 03/03/2016 14:03:49 Cassarino, Sameeha AMarland Kitchen ([id number]) -------------------------------------------------------------------------------- Vitals Details Patient Name: Vater, Phyliss A. Date of Service: 03/03/2016 1:30 PM Medical Record Patient Account Number: [id number] Number: Treating RN: Montey Hora [birth year] (75 y.o. Other Clinician: Date of Birth/Sex: Female) Treating ROBSON, MICHAEL Primary Care Ana Liaw: Lavera [name] Kamari Bilek/Extender: G Referring Brinkley Peet: [name] in Treatment: 10 Vital Signs Time Taken: 13:46 Temperature (F): 97.0 Height (in): 62 Pulse (bpm): 79 Weight (lbs): 161 Respiratory Rate (breaths/min): 16 Body Mass Index (BMI): 29.4 Blood Pressure (mmHg): 122/45 Reference Range: 80 - 120 mg / dl Electronic Signature(s) Signed: 03/03/2016 5:23:30 PM By: Montey Hora Entered By: Montey Hora on 03/03/2016 13:46:42

## 2016-03-04 NOTE — Progress Notes (Signed)
RONAE, NOELL ([id number]) Visit Report for 03/03/2016 Chief Complaint Document Details Patient Name: [name], [name] Date of Service: 03/03/2016 1:30 PM Medical Record Patient Account Number: [id number] Number: Treating RN: [name] 1940/05/25 (75 y.o. Other Clinician: Date of Birth/Sex: Female) Treating [name] Primary Care [name]: [name] [name]/Extender: [name]: [name] in Treatment: 10 Information Obtained from: Patient Chief Complaint 12/24/15 patient is here for review of to nonhealing surgical wounds in a CABG scar Electronic Signature(s) Signed: 03/03/2016 4:59:22 PM By: [name] MD Entered By: [name] on 03/03/2016 14:33:55 [name], [name]. ([id number]) -------------------------------------------------------------------------------- HPI Details Patient Name: [name], [name] Date of Service: 03/03/2016 1:30 PM Medical Record Patient Account Number: [id number] Number: Treating RN: [name] 1940-09-14 (75 y.o. Other Clinician: Date of Birth/Sex: Female) Treating [name] Primary Care [name]: [name] [name]/Extender: [name]: [name] in Treatment: 10 History of Present Illness HPI Description: 12/24/15; [name] is a 76 year old woman who is the mother of one of our other clinic patient's [name] He comes [place] today for review a nonhealing surgical CABG wound. The patient underwent CABG o2 on October 31 17 at [facility]. She apparently had had a myocardial infarction in October but briefly presented with shortness of breath. She underwent surgery which I understand was uneventful. According to the patient her surgical incision never really healed in 2 spots. One is at the inferior recess question drain site, the other more superiorly about an inch from the superior surface of this it incision. She has been followed by her surgeon at  [facility] who felt that both areas which he'll according to the patient. She was put on Augmentin 2 weeks ago and she is finished a course of this culture at that time showed Escherichia coli which should've been sensitive. The patient also relates that she has a history of allergy to ampicillin. Besides this the patient has a history of type 2 diabetes on metformin, multiple myeloma. She had a chest x-ray in mid October the did not show any abnormalities. I cannot see that she had any specific views of the sternum however. We have her referral note from her primary physician Dr. Bernie [name]. She was concerned about erythema around the wound and as mentioned the culture here showed Escherichia coli which should have been Augmentin sensitive but was resistant to ampicillin, Keflex third generation cephalosporins quinolones and sulfonamides. [ESBL). The patient does not complain of fever or chills excessive pain. She does say that the lower surgical wound has been draining what looks to her like pus. She has been using triple antibiotic cream. 12/31/15 superior wound actually looks somewhat better. Inferiorly still a wound with some depth greater than 2 cm. Using Aquacel Ag. Culture I did of the drainage from the inferior wound and this surgical incision last week was negative 01/07/16; superior wound still requires debridement of surface eschar and nonviable subcutaneous tissue. Inferior wound measured at 1.8 cm. What I can see of the tissue here does not look very healthy. Original culture that I did on her presentation was negative however 01/27/16; the superior wound is closed she still has a deep probing wound of roughly 2 cm inferiorly. There is no overt infection no surrounding erythema. The patient tells me that 2 weeks ago she was developing chest pain and underwent a nuclear stress test. This was apparently negative however she developed chest pain shortly thereafter and had to go have a  cardiac  catheterization that did not show blockages. Since then she is not having chest pain but has unrelenting nausea making it difficult for her to eat. We reviewed her medications and she is on aspirin and Plavix however she apparently has a stent in place as well. 02/03/16; still using silver alginate. Depth of this is down from 1.8 cm the circumference is smaller and the depth down by 2 mm red no drainage and no surrounding infection 02/10/16; still using silver alginate depth down to 1.3 cm today measured by myself. Appears to be making slow progress. There is no surrounding tenderness.. The patient states she has unrelenting nausea which is nonexertional. Not related to meals. She has had no vomiting. She also tells me she is had previous lap [name], [name] ([id number]) band surgery but does not give clear reflux type symptoms 02/25/16; the wound orifice of this lady's wound on her chest is down to 1 cm in depth and much smaller in terms of the overall diameter. She tells me that she was packing the wound recently and developed quite a bit of bleeding that took several hours to stop she is on Plavix and aspirin. She is not having any exertional symptoms that sound like coronary artery disease. She is complaining of unrelenting nausea but she is being evaluated by GI for this and apparently has an endoscopy planned for March. We have been using iodoform packing 03/03/16; small wound in terms of orifice but measures 1.2 cm in depth, not much different from last time. She still complains of nausea and has an upper GI series books she is not describing exertional chest pain, shortness of breath, nausea or fatigue Electronic Signature(s) Signed: 03/03/2016 4:59:22 PM By: [name] MD Entered By: [name] on 03/03/2016 14:35:12 Fill, Torrence A. ([id number]) -------------------------------------------------------------------------------- Physical Exam Details Patient Name: [name], [name] Date of Service: 03/03/2016 1:30 PM Medical Record Patient Account Number: [id number] Number: Treating RN: [name] 08/29/1940 (75 y.o. Other Clinician: Date of Birth/Sex: Female) Treating Shajuana Mclucas Primary Care [name]: [name] [name]/Extender: [name]: [name] in Treatment: 10 Constitutional Sitting or standing Blood Pressure is within target range for patient.. Pulse regular and within target range for patient.Marland Kitchen Respirations regular, non-labored and within target range.. Temperature is normal and within the target range for the patient.. Patient's appearance is neat and clean. Appears in no acute distress. Well nourished and well developed.Marland Kitchen Respiratory Respiratory effort is easy and symmetric bilaterally. Rate is normal at rest and on room air.. Bilateral breath sounds are clear and equal in all lobes with no wheezes, rales or rhonchi.. Cardiovascular Heart rhythm and rate regular, without murmur or gallop.Marland Kitchen Psychiatric No evidence of depression, anxiety, or agitation. Calm, cooperative, and communicative. Appropriate interactions and affect.. Notes Wound exam; 1.2 cm in depth today not much different from last time. There is no evidence of surrounding infection. Actual wound orifice appears to be smaller however Electronic Signature(s) Signed: 03/03/2016 4:59:22 PM By: [name] MD Entered By: [name] on 03/03/2016 14:36:24 Fukuda, Tia AMarland Kitchen ([id number]) -------------------------------------------------------------------------------- Physician Orders Details Patient Name: [name], [name] Date of Service: 03/03/2016 1:30 PM Medical Record Patient Account Number: [id number] Number: Treating RN: [name] 09/06/1940 (75 y.o. Other Clinician: Date of Birth/Sex: Female) Treating Aurie Harroun Primary Care [name]: [name] [name]/Extender: [name]: [name] in  Treatment: 10 Verbal / Phone Orders: No Diagnosis Coding Wound Cleansing Wound #2 Distal,Midline Chest o Clean wound with Normal Saline. o  May Shower, gently pat wound dry prior to applying new dressing. Primary Wound Dressing Wound #2 Distal,Midline Chest o Iodoform packing Gauze Secondary Dressing Wound #2 Distal,Midline Chest o Dry Gauze o Other - telfa island Dressing Change Frequency Wound #2 Distal,Midline Chest o Change dressing every day. Follow-up Appointments Wound #2 Distal,Midline Chest o Return Appointment in 2 weeks. Additional Orders / Instructions Wound #2 Distal,Midline Chest o Increase protein intake. Electronic Signature(s) Signed: 03/03/2016 4:59:22 PM By: [name] MD Signed: 03/03/2016 5:23:30 PM By: [name] Entered By: [name] on 03/03/2016 14:04:44 [name], [name] ([id number]) -------------------------------------------------------------------------------- Problem List Details Patient Name: Bantz, Rilei A. Date of Service: 03/03/2016 1:30 PM Medical Record Patient Account Number: [id number] Number: Treating RN: [name] November 21, 1940 (75 y.o. 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  ([id number]) -------------------------------------------------------------------------------- Progress Note Details Patient Name: [name], [name] Date of Service: 03/03/2016 1:30 PM Medical Record Patient Account Number: [id number] Number: Treating RN: [name] Oct 06, 1940 (75 y.o. Other Clinician: Date of Birth/Sex: Female) Treating Lashaundra Lehrmann Primary Care [name]: [name] [name]/Extender: [name]: [name] in Treatment: 10 Subjective Chief Complaint Information obtained from Patient 12/24/15 patient is here for review of to nonhealing surgical wounds in a CABG scar History of Present Illness (HPI) 12/24/15; Mrs. Lanza is a 76 year old woman who is the mother of one of our other clinic patient's [name] Gouveia. He comes [place] today for review a nonhealing surgical CABG wound. The patient underwent CABG o2 on October 31 17 at [facility]. She apparently had had a myocardial infarction in October but briefly presented with shortness of breath. She underwent surgery which I understand was uneventful. According to the patient her surgical incision never really healed in 2 spots. One is at the inferior recess question drain site, the other more superiorly about an inch from the superior surface of this it incision. She has been followed by her surgeon at [facility] who felt that both areas which he'll according to the patient. She was put on Augmentin 2 weeks ago and she is finished a course of this culture at that time showed Escherichia coli which should've been sensitive. The patient also relates that she has a history of allergy to ampicillin. Besides this the patient has a history of type 2 diabetes on metformin, multiple myeloma. She had a chest x-ray in mid October the did not show any abnormalities. I cannot see that she had any specific views of the sternum however. We have her referral note from her primary physician Dr. Bernie [name]. She was concerned about erythema around the wound and as mentioned the culture here showed Escherichia coli which should have been Augmentin sensitive but was resistant to ampicillin, Keflex third generation cephalosporins quinolones and sulfonamides. [ESBL). The patient does not complain of fever or chills excessive pain. She does say that the lower surgical wound has been draining what looks to her like pus. She has been using triple antibiotic cream. 12/31/15 superior wound actually looks  somewhat better. Inferiorly still a wound with some depth greater than 2 cm. Using Aquacel Ag. Culture I did of the drainage from the inferior wound and this surgical incision last week was negative 01/07/16; superior wound still requires debridement of surface eschar and nonviable subcutaneous tissue. Inferior wound measured at 1.8 cm. What I can see of the tissue here does not look very healthy. Original culture that I did on her presentation was negative however 01/27/16; the superior wound is closed she still has a deep probing wound of roughly 2 cm inferiorly. There is no overt infection no surrounding erythema. The patient tells me that 2 weeks ago she was developing chest pain and underwent a nuclear stress test. This was apparently negative however she developed chest pain shortly thereafter and had to go have a cardiac catheterization that did not show blockages. Since then she is not having chest pain but has unrelenting nausea making it difficult for her to eat. We reviewed her [name], [name] ([id number]) medications and she is on aspirin and Plavix however she apparently has a stent in place as well. 02/03/16; still using silver alginate. Depth of this is down from 1.8 cm the circumference is smaller and the depth down by 2 mm red no drainage and no surrounding infection 02/10/16; still using silver alginate depth down to 1.3 cm today measured by myself. Appears to be making slow progress.  There is no surrounding tenderness.. The patient states she has unrelenting nausea which is nonexertional. Not related to meals. She has had no vomiting. She also tells me she is had previous lap band surgery but does not give clear reflux type symptoms 02/25/16; the wound orifice of this lady's wound on her chest is down to 1 cm in depth and much smaller in terms of the overall diameter. She tells me that she was packing the wound recently and developed quite a bit of bleeding that took several hours to stop she is on Plavix and aspirin. She is not having any exertional symptoms that sound like coronary artery disease. She is complaining of unrelenting nausea but she is being evaluated by GI for this and apparently has an endoscopy planned for March. We have been using iodoform packing 03/03/16; small wound in terms of orifice but measures 1.2 cm in depth, not much different from last time. She still complains of nausea and has an upper GI series books she is not describing exertional chest pain, shortness of breath, nausea or fatigue Objective Constitutional Sitting or standing Blood Pressure is within target range for patient.. Pulse regular and within target range for patient.Marland Kitchen Respirations regular, non-labored and within target range.. Temperature is normal and within the target range for the patient.. Patient's appearance is neat and clean. Appears in no acute distress. Well nourished and well developed.. Vitals Time Taken: 1:46 PM, Height: 62 in, Weight: 161 lbs, BMI: 29.4, Temperature: 97.0 F, Pulse: 79 bpm, Respiratory Rate: 16 breaths/min, Blood Pressure: 122/45 mmHg. Respiratory Respiratory effort is easy and symmetric bilaterally. Rate is normal at rest and on room air.. Bilateral breath sounds are clear and equal in all lobes with no wheezes, rales or rhonchi.. Cardiovascular Heart rhythm and rate regular, without murmur or gallop.Marland Kitchen Psychiatric No evidence of depression,  anxiety, or agitation. Calm, cooperative, and communicative. Appropriate interactions and affect.. General Notes: Wound exam; 1.2 cm in depth today not much different from last time. There is no evidence of surrounding infection. Actual wound orifice appears to be smaller however Integumentary (  Hair, Skin) [name], [name] ([id number]) Wound #2 status is Open. Original cause of wound was Surgical Injury. The wound is located on the Distal,Midline Chest. The wound measures 0.2cm length x 0.2cm width x 1.2cm depth; 0.031cm^2 area and 0.038cm^3 volume. The wound is limited to skin breakdown. There is no tunneling or undermining noted. There is a large amount of sanguinous drainage noted. The wound margin is flat and intact. There is large (67-100%) red granulation within the wound bed. There is no necrotic tissue within the wound bed. The periwound skin appearance did not exhibit: Callus, Crepitus, Excoriation, Induration, Rash, Scarring, Dry/Scaly, Maceration, Atrophie Blanche, Cyanosis, Ecchymosis, Hemosiderin Staining, Mottled, Pallor, Rubor, Erythema. Periwound temperature was noted as No Abnormality. 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 May Shower, gently pat wound dry prior to applying new dressing. Primary Wound Dressing: Wound #2 Distal,Midline Chest: Iodoform packing Gauze Secondary Dressing: Wound #2 Distal,Midline Chest: Dry Gauze Other - telfa island Dressing Change Frequency: Wound #2 Distal,Midline Chest: Change dressing every day. Follow-up Appointments: Wound #2 Distal,Midline Chest: Return Appointment in 2 weeks. Additional Orders / Instructions: Wound #2  Distal,Midline Chest: Increase protein intake. [name], [name] ([id number]) 1 continue iodorom. If we can get the orifice small enough, Iodoorbointment without regard to depth no infection cardiac status is stable Electronic Signature(s) Signed: 03/03/2016 4:59:22 PM By: [name] MD Entered By: [name] on 03/03/2016 14:37:52 [name], [name] ([id number]) -------------------------------------------------------------------------------- SuperBill Details Patient Name: [name], [name] Date of Service: 03/03/2016 Medical Record Patient Account Number: [id number] Number: Treating RN: [name] 06-Feb-1940 (75 y.o. 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

## 2016-03-09 ENCOUNTER — Ambulatory Visit

## 2016-03-10 ENCOUNTER — Encounter: Payer: Self-pay | Admitting: *Deleted

## 2016-03-10 DIAGNOSIS — Z006 Encounter for examination for normal comparison and control in clinical research program: Secondary | ICD-10-CM

## 2016-03-10 NOTE — Progress Notes (Signed)
Late entry: Subject met inclusion and exclusion criteria. The informed consent form, study requirements and expectations were reviewed with the subject and questions and concerns were addressed prior to the signing of the consent form. The subject verbalized understanding of the trail requirements. The subject agreed to participate in the [place] signed the informed consent. The informed consent was obtained prior to performance of any protocol-specific procedures for the subject. A copy of the signed informed consent was given to the subject and a copy was placed in the subject's medical record.  Jake Bathe, RN 02/27/2016

## 2016-03-11 ENCOUNTER — Ambulatory Visit

## 2016-03-16 ENCOUNTER — Ambulatory Visit

## 2016-03-17 ENCOUNTER — Telehealth: Payer: Self-pay | Admitting: Pharmacist

## 2016-03-17 ENCOUNTER — Encounter: Payer: Medicare Other | Attending: Internal Medicine | Admitting: Internal Medicine

## 2016-03-17 DIAGNOSIS — Z7984 Long term (current) use of oral hypoglycemic drugs: Secondary | ICD-10-CM | POA: Diagnosis not present

## 2016-03-17 DIAGNOSIS — L98498 Non-pressure chronic ulcer of skin of other sites with other specified severity: Secondary | ICD-10-CM | POA: Insufficient documentation

## 2016-03-17 DIAGNOSIS — T8131XD Disruption of external operation (surgical) wound, not elsewhere classified, subsequent encounter: Secondary | ICD-10-CM | POA: Diagnosis not present

## 2016-03-17 DIAGNOSIS — E1165 Type 2 diabetes mellitus with hyperglycemia: Secondary | ICD-10-CM | POA: Insufficient documentation

## 2016-03-17 DIAGNOSIS — L03313 Cellulitis of chest wall: Secondary | ICD-10-CM | POA: Diagnosis not present

## 2016-03-17 DIAGNOSIS — Z7902 Long term (current) use of antithrombotics/antiplatelets: Secondary | ICD-10-CM | POA: Insufficient documentation

## 2016-03-17 DIAGNOSIS — C9000 Multiple myeloma not having achieved remission: Secondary | ICD-10-CM | POA: Insufficient documentation

## 2016-03-17 DIAGNOSIS — E785 Hyperlipidemia, unspecified: Secondary | ICD-10-CM

## 2016-03-17 DIAGNOSIS — Y839 Surgical procedure, unspecified as the cause of abnormal reaction of the patient, or of later complication, without mention of misadventure at the time of the procedure: Secondary | ICD-10-CM | POA: Insufficient documentation

## 2016-03-17 DIAGNOSIS — Z7982 Long term (current) use of aspirin: Secondary | ICD-10-CM | POA: Insufficient documentation

## 2016-03-17 MED ORDER — ALIROCUMAB 75 MG/ML ~~LOC~~ SOPN: 1.0000 "pen " | PEN_INJECTOR | SUBCUTANEOUS | 3 refills | Status: DC

## 2016-03-17 NOTE — Telephone Encounter (Signed)
Received call from pharmacist with [facility] specialty pharmacy. They are filling Praluent prescriptions for pts in the [employer] Praluent patient assistance program. They require a 90 day supply - new rx has been sent in.  Pt feels comfortable with injections at home (uses Victoza already). Scheduled follow up lab work for the week prior to office visit with Dr Meda Coffee in April.

## 2016-03-18 ENCOUNTER — Ambulatory Visit

## 2016-03-19 NOTE — Progress Notes (Signed)
SHALECE, STAFFA ([id number]) Visit Report for 03/17/2016 Chief Complaint Document Details Patient Name: [name], [name] Date of Service: 03/17/2016 11:00 AM Medical Record Patient Account Number: [id number] Number: Treating RN: Montey Hora February 16, 1940 (76 y.o. Other Clinician: Date of Birth/Sex: Female) Treating Khaya Theissen Primary Care Provider: Lavera [name] Provider/Extender: G Referring Provider: [name] in Treatment: 12 Information Obtained from: Patient Chief Complaint 12/24/15 patient is here for review of to nonhealing surgical wounds in a CABG scar Electronic Signature(s) Signed: 03/18/2016 5:01:50 PM By: [name] MD Entered By: [name] on 03/17/2016 11:40:29 [place], [place]. ([id number]) -------------------------------------------------------------------------------- HPI Details Patient Name: [name], [name] Date of Service: 03/17/2016 11:00 AM Medical Record Patient Account Number: [id number] Number: Treating RN: Montey Hora [birth year] (76 y.o. Other Clinician: Date of Birth/Sex: Female) Treating Aarush Stukey Primary Care Provider: Lavera [name] Provider/Extender: G Referring Provider: [name] in Treatment: 12 History of Present Illness HPI Description: 12/24/15; Mrs. Llewellyn is a 76 year old woman who is the mother of one of our other clinic patient's Tammy Ramus. He comes [place] today for review a nonhealing surgical CABG wound. The patient underwent CABG o2 on October 31 17 at [facility]. She apparently had had a myocardial infarction in October but briefly presented with shortness of breath. She underwent surgery which I understand was uneventful. According to the patient her surgical incision never really healed in 2 spots. One is at the inferior recess question drain site, the other more superiorly about an inch from the superior surface of this it incision. She has been followed by her surgeon at  [facility] who felt that both areas which he'll according to the patient. She was put on Augmentin 2 weeks ago and she is finished a course of this culture at that time showed Escherichia coli which should've been sensitive. The patient also relates that she has a history of allergy to ampicillin. Besides this the patient has a history of type 2 diabetes on metformin, multiple myeloma. She had a chest x-ray in mid October the did not show any abnormalities. I cannot see that she had any specific views of the sternum however. We have her referral note from her primary physician Dr. Bernie Covey. She was concerned about erythema around the wound and as mentioned the culture here showed Escherichia coli which should have been Augmentin sensitive but was resistant to ampicillin, Keflex third generation cephalosporins quinolones and sulfonamides. [ESBL). The patient does not complain of fever or chills excessive pain. She does say that the lower surgical wound has been draining what looks to her like pus. She has been using triple antibiotic cream. 12/31/15 superior wound actually looks somewhat better. Inferiorly still a wound with some depth greater than 2 cm. Using Aquacel Ag. Culture I did of the drainage from the inferior wound and this surgical incision last week was negative 01/07/16; superior wound still requires debridement of surface eschar and nonviable subcutaneous tissue. Inferior wound measured at 1.8 cm. What I can see of the tissue here does not look very healthy. Original culture that I did on her presentation was negative however 01/27/16; the superior wound is closed she still has a deep probing wound of roughly 2 cm inferiorly. There is no overt infection no surrounding erythema. The patient tells me that 2 weeks ago she was developing chest pain and underwent a nuclear stress test. This was apparently negative however she developed chest pain shortly thereafter and had to go have a  cardiac  catheterization that did not show blockages. Since then she is not having chest pain but has unrelenting nausea making it difficult for her to eat. We reviewed her medications and she is on aspirin and Plavix however she apparently has a stent in place as well. 02/03/16; still using silver alginate. Depth of this is down from 1.8 cm the circumference is smaller and the depth down by 2 mm red no drainage and no surrounding infection 02/10/16; still using silver alginate depth down to 1.3 cm today measured by myself. Appears to be making slow progress. There is no surrounding tenderness.. The patient states she has unrelenting nausea which is nonexertional. Not related to meals. She has had no vomiting. She also tells me she is had previous lap Ore, Shai A. ([id number]) band surgery but does not give clear reflux type symptoms 02/25/16; the wound orifice of this lady's wound on her chest is down to 1 cm in depth and much smaller in terms of the overall diameter. She tells me that she was packing the wound recently and developed quite a bit of bleeding that took several hours to stop she is on Plavix and aspirin. She is not having any exertional symptoms that sound like coronary artery disease. She is complaining of unrelenting nausea but she is being evaluated by GI for this and apparently has an endoscopy planned for March. We have been using iodoform packing 03/03/16; small wound in terms of orifice but measures 1.2 cm in depth, not much different from last time. She still complains of nausea and has an upper GI series books she is not describing exertional chest pain, shortness of breath, nausea or fatigue. 03/16/16; I follow this patient for a nonhealing surgical wound at a site of a previous CABG scar. She developed cellulitis in the incision postoperatively. Today she arrives with the wound orifice to miniscule to even consider measuring a depth. She came in with a Band-Aid over this, she  could not get any of the iodoform packing in the increasingly small orifice Electronic Signature(s) Signed: 03/18/2016 5:01:50 PM By: [name] MD Entered By: [name] on 03/17/2016 11:44:22 [name], [name] ([id number]) -------------------------------------------------------------------------------- Physical Exam Details Patient Name: [name], [name] Date of Service: 03/17/2016 11:00 AM Medical Record Patient Account Number: [id number] Number: Treating RN: Montey Hora July 04, 1940 (75 y.o. Other Clinician: Date of Birth/Sex: Female) Treating Danene Montijo Primary Care Provider: Lavera [name] Provider/Extender: G Referring Provider: [name] in Treatment: 12 Constitutional Patient is hypertensive.. Pulse regular and within target range for patient.Marland Kitchen Respirations regular, non-labored and within target range.. Temperature is normal and within the target range for the patient.. Patient's appearance is neat and clean. Appears in no acute distress. Well nourished and well developed.. Eyes Conjunctivae clear. No discharge.Marland Kitchen Respiratory Respiratory effort is easy and symmetric bilaterally. Rate is normal at rest and on room air.. Bilateral breath sounds are clear and equal in all lobes with no wheezes, rales or rhonchi.. Cardiovascular Heart rhythm and rate regular, without murmur or gallop.. Gastrointestinal (GI) Abdomen is soft and non-distended without masses or tenderness. Bowel sounds active in all quadrants.. Notes Wound exam; the patient's orifice is miniscule today. We could not measure an accurate depth. There is no evidence of surrounding soft tissue infection crepitus or tenderness. Electronic Signature(s) Signed: 03/18/2016 5:01:50 PM By: [name] MD Entered By: [name] on 03/17/2016 11:48:53 Turney, [name] ([id number]) -------------------------------------------------------------------------------- Physician Orders Details Patient  Name: [name], [name] Date of Service: 03/17/2016 11:00 AM  Medical Record Patient Account Number: [id number] Number: Treating RN: Montey Hora Jan 13, 1940 (75 y.o. Other Clinician: Date of Birth/Sex: Female) Treating Jceon Alverio Primary Care Provider: Lavera [name] Provider/Extender: G Referring Provider: [name] in Treatment: 12 Verbal / Phone Orders: No Diagnosis Coding Wound Cleansing Wound #2 Distal,Midline Chest o Clean wound with Normal Saline. o May Shower, gently pat wound dry prior to applying new dressing. Primary Wound Dressing Wound #2 Distal,Midline Chest o Iodosorb Ointment Secondary Dressing Wound #2 Distal,Midline Chest o Other - coverlet Dressing Change Frequency Wound #2 Distal,Midline Chest o Change dressing every day. Follow-up Appointments Wound #2 Distal,Midline Chest o Return Appointment in 2 weeks. Additional Orders / Instructions Wound #2 Distal,Midline Chest o Increase protein intake. Electronic Signature(s) Signed: 03/17/2016 5:13:29 PM By: Montey Hora Signed: 03/18/2016 5:01:50 PM By: [name] MD Entered By: Montey Hora on 03/17/2016 11:14:40 [name], [name] AMarland Kitchen ([id number]) -------------------------------------------------------------------------------- Problem List Details Patient Name: Cart, Karizma A. Date of Service: 03/17/2016 11:00 AM Medical Record Patient Account Number: [id number] Number: Treating RN: Montey Hora 11-30-1940 (75 y.o. 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 ([id number]) -------------------------------------------------------------------------------- Progress Note Details Patient Name: [name], [name] Date of Service: 03/17/2016 11:00 AM Medical Record Patient Account Number: [id number] Number: Treating RN: Montey Hora Feb 27, 1940 (75 y.o. Other Clinician: Date of Birth/Sex: Female) Treating Anvitha Hutmacher Primary Care Provider: Lavera [name] Provider/Extender: G Referring Provider: [name] in Treatment: 12 Subjective Chief Complaint Information obtained from Patient 12/24/15 patient is here for review of to nonhealing surgical wounds in a CABG scar History of Present Illness (HPI) 12/24/15; Mrs. Denise is a 76 year old woman who is the mother of one of our other clinic patient's Tammy Marano. He comes [place] today for review a nonhealing surgical CABG wound. The patient underwent CABG o2 on October 31 17 at [facility]. She apparently had had a myocardial infarction in October but briefly presented with shortness of breath. She underwent surgery which I understand was uneventful. According to the patient her surgical incision never really healed in 2 spots. One is at the inferior recess question drain site, the other more superiorly about an inch from the superior surface of this it incision. She has been followed by her surgeon at [facility] who felt that both areas which he'll according to the patient. She was put on Augmentin 2 weeks ago and she is finished a course of this culture at that time showed Escherichia coli which should've been sensitive. The patient also relates that she has a history of allergy to ampicillin. Besides this the patient has a history of  type 2 diabetes on metformin, multiple myeloma. She had a chest x-ray in mid October the did not show any abnormalities. I cannot see that she had any specific views of the sternum however. We have her referral note from her primary physician Dr. Bernie Covey. She was concerned about erythema around the wound and as mentioned the culture here showed Escherichia coli which should have been Augmentin  sensitive but was resistant to ampicillin, Keflex third generation cephalosporins quinolones and sulfonamides. [ESBL). The patient does not complain of fever or chills excessive pain. She does say that the lower surgical wound has been draining what looks to her like pus. She has been using triple antibiotic cream. 12/31/15 superior wound actually looks somewhat better. Inferiorly still a wound with some depth greater than 2 cm. Using Aquacel Ag. Culture I did of the drainage from the inferior wound and this surgical incision last week was negative 01/07/16; superior wound still requires debridement of surface eschar and nonviable subcutaneous tissue. Inferior wound measured at 1.8 cm. What I can see of the tissue here does not look very healthy. Original culture that I did on her presentation was negative however 01/27/16; the superior wound is closed she still has a deep probing wound of roughly 2 cm inferiorly. There is no overt infection no surrounding erythema. The patient tells me that 2 weeks ago she was developing chest pain and underwent a nuclear stress test. This was apparently negative however she developed chest pain shortly thereafter and had to go have a cardiac catheterization that did not show blockages. Since then she is not having chest pain but has unrelenting nausea making it difficult for her to eat. We reviewed her [name], [name] ([id number]) medications and she is on aspirin and Plavix however she apparently has a stent in place as well. 02/03/16; still using silver alginate.  Depth of this is down from 1.8 cm the circumference is smaller and the depth down by 2 mm red no drainage and no surrounding infection 02/10/16; still using silver alginate depth down to 1.3 cm today measured by myself. Appears to be making slow progress. There is no surrounding tenderness.. The patient states she has unrelenting nausea which is nonexertional. Not related to meals. She has had no vomiting. She also tells me she is had previous lap band surgery but does not give clear reflux type symptoms 02/25/16; the wound orifice of this lady's wound on her chest is down to 1 cm in depth and much smaller in terms of the overall diameter. She tells me that she was packing the wound recently and developed quite a bit of bleeding that took several hours to stop she is on Plavix and aspirin. She is not having any exertional symptoms that sound like coronary artery disease. She is complaining of unrelenting nausea but she is being evaluated by GI for this and apparently has an endoscopy planned for March. We have been using iodoform packing 03/03/16; small wound in terms of orifice but measures 1.2 cm in depth, not much different from last time. She still complains of nausea and has an upper GI series books she is not describing exertional chest pain, shortness of breath, nausea or fatigue. 03/16/16; I follow this patient for a nonhealing surgical wound at a site of a previous CABG scar. She developed cellulitis in the incision postoperatively. Today she arrives with the wound orifice to miniscule to even consider measuring a depth. She came in with a Band-Aid over this, she could not get any of the iodoform packing in the increasingly small orifice Objective Constitutional Patient is hypertensive.. Pulse regular and within target range for patient.Marland Kitchen Respirations regular, non-labored and within target range.. Temperature is normal and within the target range for the patient.. Patient's appearance is  neat and clean. Appears in no acute distress. Well nourished and well developed.. Vitals Time Taken: 11:02 AM, Height: 62 in, Weight:  161 lbs, BMI: 29.4, Temperature: 97.4 F, Pulse: 81 bpm, Respiratory Rate: 16 breaths/min, Blood Pressure: 145/61 mmHg. General Notes: Wound exam; the patient's orifice is miniscule today. We could not measure an accurate depth. There is no evidence of surrounding soft tissue infection crepitus or tenderness. Integumentary (Hair, Skin) Wound #2 status is Open. Original cause of wound was Surgical Injury. The wound is located on the Distal,Midline Chest. The wound measures 0.1cm length x 0.1cm width x 0.2cm depth; 0.008cm^2 area and 0.002cm^3 volume. The wound is limited to skin breakdown. There is no tunneling or undermining noted. There is a large amount of sanguinous drainage noted. The wound margin is flat and intact. There is large (67-100%) red granulation within the wound bed. There is no necrotic tissue within the wound bed. The periwound skin appearance did not exhibit: Callus, Crepitus, Excoriation, Induration, Rash, Scarring, Dry/Scaly, Maceration, Atrophie Blanche, Cyanosis, Ecchymosis, Hemosiderin Staining, Mottled, Pallor, Cress, Maddison A. ([id number]) Rubor, Erythema. Periwound temperature was noted as No Abnormality. 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 May Shower, gently pat wound dry prior to applying new dressing. Primary Wound Dressing: Wound #2 Distal,Midline Chest: Iodosorb Ointment Secondary Dressing: Wound #2 Distal,Midline Chest: Other - coverlet Dressing Change Frequency: Wound #2 Distal,Midline Chest: Change dressing every  day. Follow-up Appointments: Wound #2 Distal,Midline Chest: Return Appointment in 2 weeks. Additional Orders / Instructions: Wound #2 Distal,Midline Chest: Increase protein intake. #1 change the primary dressing to iodosorb ointment to see if we can hold a small open area together. Cover this with a Band-Aid change daily [name], [name] ([id number]) #2 follow-up in 2 weeks #3 her cardiac status is stable, no clinical chest symptoms (although pre surgical symtoms were jaw and arm. No exeertional chest symptoms Electronic Signature(s) Signed: 03/18/2016 5:01:50 PM By: [name] MD Entered By: [name] on 03/17/2016 11:50:27 [name], [name] ([id number]) -------------------------------------------------------------------------------- SuperBill Details Patient Name: [name], [name] Date of Service: 03/17/2016 Medical Record Patient Account Number: [id number] Number: Treating RN: Montey Hora 1940-08-29 (75 y.o. 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

## 2016-03-19 NOTE — Progress Notes (Signed)
ISHIA, TENORIO ([id number]) Visit Report for 03/17/2016 Arrival Information Details Patient Name: Buntrock, Jenese A. Date of Service: 03/17/2016 11:00 AM Medical Record Patient Account Number: [id number] Number: Treating RN: Montey Hora February 08, 1940 (76 y.o. Other Clinician: Date of Birth/Sex: Female) Treating ROBSON, MICHAEL Primary Care Jazleen Robeck: Lavera [name] Eyanna Mcgonagle/Extender: G Referring De Libman: [name] in Treatment: 12 Visit Information History Since Last Visit Added or deleted any medications: No Patient Arrived: Walker Any new allergies or adverse reactions: No Arrival Time: 10:59 Had a fall or experienced change in No Accompanied By: self activities of daily living that may affect Transfer Assistance: None risk of falls: Patient Identification Verified: Yes Signs or symptoms of abuse/neglect since last No Secondary Verification Process Completed: Yes visito Patient Requires Transmission-Based No Hospitalized since last visit: No Precautions: Has Dressing in Place as Prescribed: Yes Patient Has Alerts: Yes Pain Present Now: No Electronic Signature(s) Signed: 03/17/2016 5:13:29 PM By: Montey Hora Entered By: Montey Hora on 03/17/2016 10:59:57 Bronkema, Johnita A. ([id number]) -------------------------------------------------------------------------------- Clinic Level of Care Assessment Details Patient Name: Collymore, Amariya A. Date of Service: 03/17/2016 11:00 AM Medical Record Patient Account Number: [id number] Number: Treating RN: Montey Hora 23-Aug-1940 (76 y.o. Other Clinician: Date of Birth/Sex: Female) Treating ROBSON, Walker Primary Care Alyaan Budzynski: Lavera [name] Teairra Millar/Extender: G Referring Maurico Perrell: [name] in Treatment: 12 Clinic Level of Care Assessment Items TOOL 4 Quantity Score []  - Use when only an EandM is performed on FOLLOW-UP visit 0 ASSESSMENTS - Nursing Assessment / Reassessment X - Reassessment of  Co-morbidities (includes updates in patient status) 1 10 X - Reassessment of Adherence to Treatment Plan 1 5 ASSESSMENTS - Wound and Skin Assessment / Reassessment X - Simple Wound Assessment / Reassessment - one wound 1 5 []  - Complex Wound Assessment / Reassessment - multiple wounds 0 []  - Dermatologic / Skin Assessment (not related to wound area) 0 ASSESSMENTS - Focused Assessment []  - Circumferential Edema Measurements - multi extremities 0 []  - Nutritional Assessment / Counseling / Intervention 0 []  - Lower Extremity Assessment (monofilament, tuning fork, pulses) 0 []  - Peripheral Arterial Disease Assessment (using hand held doppler) 0 ASSESSMENTS - Ostomy and/or Continence Assessment and Care []  - Incontinence Assessment and Management 0 []  - Ostomy Care Assessment and Management (repouching, etc.) 0 PROCESS - Coordination of Care X - Simple Patient / Family Education for ongoing care 1 15 []  - Complex (extensive) Patient / Family Education for ongoing care 0 []  - Staff obtains Programmer, systems, Records, Test Results / Process Orders 0 []  - Staff telephones HHA, Nursing Homes / Clarify orders / etc 0 Johnsey, Venisha A. ([id number]) []  - Routine Transfer to another Facility (non-emergent condition) 0 []  - Routine Hospital Admission (non-emergent condition) 0 []  - New Admissions / Biomedical engineer / Ordering NPWT, Apligraf, etc. 0 []  - Emergency Hospital Admission (emergent condition) 0 X - Simple Discharge Coordination 1 10 []  - Complex (extensive) Discharge Coordination 0 PROCESS - Special Needs []  - Pediatric / Minor Patient Management 0 []  - Isolation Patient Management 0 []  - Hearing / Language / Visual special needs 0 []  - Assessment of Community assistance (transportation, D/C planning, etc.) 0 []  - Additional assistance / Altered mentation 0 []  - Support Surface(s) Assessment (bed, cushion, seat, etc.) 0 INTERVENTIONS - Wound Cleansing / Measurement X - Simple Wound Cleansing  - one wound 1 5 []  - Complex Wound Cleansing - multiple wounds 0 X - Wound Imaging (photographs - any number of wounds) 1 5 []  -  Wound Tracing (instead of photographs) 0 X - Simple Wound Measurement - one wound 1 5 []  - Complex Wound Measurement - multiple wounds 0 INTERVENTIONS - Wound Dressings X - Small Wound Dressing one or multiple wounds 1 10 []  - Medium Wound Dressing one or multiple wounds 0 []  - Large Wound Dressing one or multiple wounds 0 []  - Application of Medications - topical 0 []  - Application of Medications - injection 0 Everman, Analayah A. ([id number]) INTERVENTIONS - Miscellaneous []  - External ear exam 0 []  - Specimen Collection (cultures, biopsies, blood, body fluids, etc.) 0 []  - Specimen(s) / Culture(s) sent or taken to Lab for analysis 0 []  - Patient Transfer (multiple staff / Harrel Lemon Lift / Similar devices) 0 []  - Simple Staple / Suture removal (25 or less) 0 []  - Complex Staple / Suture removal (26 or more) 0 []  - Hypo / Hyperglycemic Management (close monitor of Blood Glucose) 0 []  - Ankle / Brachial Index (ABI) - do not check if billed separately 0 X - Vital Signs 1 5 Has the patient been seen at the hospital within the last three years: Yes Total Score: 75 Level Of Care: New/Established - Level 2 Electronic Signature(s) Signed: 03/17/2016 5:13:29 PM By: Montey Hora Entered By: Montey Hora on 03/17/2016 11:37:16 Gfeller, Peggie AMarland Kitchen ([id number]) -------------------------------------------------------------------------------- Encounter Discharge Information Details Patient Name: Perrier, Lenae A. Date of Service: 03/17/2016 11:00 AM Medical Record Patient Account Number: [id number] Number: Treating RN: Montey Hora 1940/03/08 (76 y.o. Other Clinician: Date of Birth/Sex: Female) Treating ROBSON, MICHAEL Primary Care Lamarion Mcevers: Lavera [name] Emely Fahy/Extender: G Referring Faustina Gebert: [name] in Treatment: 12 Encounter Discharge Information  Items Discharge Pain Level: 0 Discharge Condition: Stable Ambulatory Status: Walker Discharge Destination: Home Transportation: Private Auto Accompanied By: self Schedule Follow-up Appointment: Yes Medication Reconciliation completed No and provided to Patient/Care Darrnell Mangiaracina: Provided on Clinical Summary of Care: 03/17/2016 Form Type Recipient Paper Patient AW Electronic Signature(s) Signed: 03/17/2016 11:38:12 AM By: Montey Hora Previous Signature: 03/17/2016 11:20:35 AM Version By: Ruthine Dose Entered By: Montey Hora on 03/17/2016 11:38:12 Holzschuh, Caroly A. ([id number]) -------------------------------------------------------------------------------- Multi Wound Chart Details Patient Name: Kolodziej, Marieke A. Date of Service: 03/17/2016 11:00 AM Medical Record Patient Account Number: [id number] Number: Treating RN: Montey Hora 09-12-1940 (75 y.o. Other Clinician: Date of Birth/Sex: Female) Treating ROBSON, MICHAEL Primary Care Kenyatte Gruber: Lavera [name] Nyaire Denbleyker/Extender: G Referring Kaliel Bolds: [name] in Treatment: 12 Vital Signs Height(in): 62 Pulse(bpm): 81 Weight(lbs): 161 Blood Pressure 145/61 (mmHg): Body Mass Index(BMI): 29 Temperature(F): 97.4 Respiratory Rate 16 (breaths/min): Photos: [2:No Photos] [N/A:N/A] Wound Location: [2:Chest - Midline, Distal] [N/A:N/A] Wounding Event: [2:Surgical Injury] [N/A:N/A] Primary Etiology: [2:Open Surgical Wound] [N/A:N/A] Comorbid History: [2:Cataracts, Anemia, Congestive Heart Failure, Coronary Artery Disease, Hypertension, Type II Diabetes, Osteoarthritis, Neuropathy, Received Chemotherapy] [N/A:N/A] Date Acquired: [2:11/11/2015] [N/A:N/A] Weeks of Treatment: [2:11] [N/A:N/A] Wound Status: [2:Open] [N/A:N/A] Measurements L x W x D 0.1x0.1x0.2 [N/A:N/A] (cm) Area (cm) : [2:0.008] [N/A:N/A] Volume (cm) : [2:0.002] [N/A:N/A] % Reduction in Area: [2:97.10%] [N/A:N/A] % Reduction in Volume: 99.70%  [N/A:N/A] Classification: [2:Full Thickness Without Exposed Support Structures] [N/A:N/A] Exudate Amount: [2:Large] [N/A:N/A] Exudate Type: [2:Sanguinous] [N/A:N/A] Exudate Color: [2:red] [N/A:N/A] Wound Margin: [2:Flat and Intact] [N/A:N/A] Granulation Amount: [2:Large (67-100%)] [N/A:N/A] Granulation Quality: Red N/A N/A Necrotic Amount: None Present (0%) N/A N/A Exposed Structures: Fascia: No N/A N/A Fat Layer (Subcutaneous Tissue) Exposed: No Tendon: No Muscle: No Joint: No Bone: No Limited to Skin Breakdown Epithelialization: None N/A N/A Periwound Skin Texture: Excoriation: No N/A N/A  Induration: No Callus: No Crepitus: No Rash: No Scarring: No Periwound Skin Maceration: No N/A N/A Moisture: Dry/Scaly: No Periwound Skin Color: Atrophie Blanche: No N/A N/A Cyanosis: No Ecchymosis: No Erythema: No Hemosiderin Staining: No Mottled: No Pallor: No Rubor: No Temperature: No Abnormality N/A N/A Tenderness on No N/A N/A Palpation: Wound Preparation: Ulcer Cleansing: N/A N/A Rinsed/Irrigated with Saline Topical Anesthetic Applied: None Treatment Notes Wound #2 (Distal, Midline Chest) 1. Cleansed with: Clean wound with Normal Saline 4. Dressing Applied: Iodosorb Ointment Other dressing (specify in notes) Notes coverlet Masci, Saamiya A. ([id number]) Electronic Signature(s) Signed: 03/18/2016 5:01:50 PM By: [name] MD Entered By: [name] on 03/17/2016 11:40:16 Tupy, Tabrina AMarland Kitchen ([id number]) -------------------------------------------------------------------------------- [facility] Details Patient Name: Mierzejewski, Marlaina A. Date of Service: 03/17/2016 11:00 AM Medical Record Patient Account Number: [id number] Number: Treating RN: Montey Hora 10-21-1940 (75 y.o. Other Clinician: Date of Birth/Sex: Female) Treating ROBSON, MICHAEL Primary Care Yojan Paskett: Lavera [name] Monterrio Gerst/Extender: G Referring Elis Sauber: [name]  in Treatment: 12 Active Inactive ` Abuse / Safety / Falls / Self Care Management Nursing Diagnoses: Impaired physical mobility Potential for falls Goals: Patient will remain injury free Date Initiated: 12/24/2015 Target Resolution Date: 02/10/2016 Goal Status: Active Interventions: Assess fall risk on admission and as needed Notes: ` Orientation to the Wound Care Program Nursing Diagnoses: Knowledge deficit related to the wound healing center program Goals: Patient/caregiver will verbalize understanding of the [facility] Date Initiated: 12/24/2015 Target Resolution Date: 02/10/2016 Goal Status: Active Interventions: Provide education on orientation to the wound center Notes: ` Wound/Skin Impairment Khatib, Shawnique A. ([id number]) Nursing Diagnoses: Impaired tissue integrity Goals: Patient/caregiver will verbalize understanding of skin care regimen Date Initiated: 12/24/2015 Target Resolution Date: 02/10/2016 Goal Status: Active Ulcer/skin breakdown will have a volume reduction of 30% by week 4 Date Initiated: 12/24/2015 Target Resolution Date: 02/10/2016 Goal Status: Active Ulcer/skin breakdown will have a volume reduction of 50% by week 8 Date Initiated: 12/24/2015 Target Resolution Date: 02/24/2016 Goal Status: Active Ulcer/skin breakdown will have a volume reduction of 80% by week 12 Date Initiated: 12/24/2015 Target Resolution Date: 03/23/2016 Goal Status: Active Ulcer/skin breakdown will heal within 14 weeks Date Initiated: 12/24/2015 Target Resolution Date: 04/06/2016 Goal Status: Active Interventions: Assess patient/caregiver ability to obtain necessary supplies Assess patient/caregiver ability to perform ulcer/skin care regimen upon admission and as needed Assess ulceration(s) every visit Notes: Electronic Signature(s) Signed: 03/17/2016 5:13:29 PM By: Montey Hora Entered By: Montey Hora on 03/17/2016 11:13:39 Carino, Braedyn A.  ([id number]) -------------------------------------------------------------------------------- Pain Assessment Details Patient Name: Saari, Musette A. Date of Service: 03/17/2016 11:00 AM Medical Record Patient Account Number: [id number] Number: Treating RN: Montey Hora 1940-07-03 (75 y.o. Other Clinician: Date of Birth/Sex: Female) Treating ROBSON, MICHAEL Primary Care Brees Hounshell: Lavera [name] Jamisyn Langer/Extender: G Referring Brae Schaafsma: [name] in Treatment: 12 Active Problems Location of Pain Severity and Description of Pain Patient Has Paino No Site Locations Pain Management and Medication Current Pain Management: Notes Topical or injectable lidocaine is offered to patient for acute pain when surgical debridement is performed. If needed, Patient is instructed to use over the counter pain medication for the following 24-48 hours after debridement. Wound care MDs do not prescribed pain medications. Patient has chronic pain or uncontrolled pain. Patient has been instructed to make an appointment with their Primary Care Physician for pain management. Electronic Signature(s) Signed: 03/17/2016 5:13:29 PM By: Montey Hora Entered By: Montey Hora on 03/17/2016 11:00:43 Storlie, [name] ([id number]) -------------------------------------------------------------------------------- Patient/Caregiver Education Details Patient Name:  Hopf, Karesha A. Date of Service: 03/17/2016 11:00 AM Medical Record Patient Account Number: [id number] Number: Treating RN: Montey Hora 06-15-1940 (75 y.o. Other Clinician: Date of Birth/Gender: Female) Treating ROBSON, MICHAEL Primary Care Physician: Lavera [name] Physician/Extender: G Referring Physician: [name] in Treatment: 12 Education Assessment Education Provided To: Patient Education Topics Provided Wound/Skin Impairment: Handouts: Other: wound care as ordered Methods: Demonstration,  Explain/Verbal Responses: State content correctly Electronic Signature(s) Signed: 03/17/2016 5:13:29 PM By: Montey Hora Entered By: Montey Hora on 03/17/2016 11:38:31 Kunde, O'Brien. ([id number]) -------------------------------------------------------------------------------- Wound Assessment Details Patient Name: Glaze, Kalii A. Date of Service: 03/17/2016 11:00 AM Medical Record Patient Account Number: [id number] Number: Treating RN: Montey Hora [birth year] (75 y.o. Other Clinician: Date of Birth/Sex: Female) Treating ROBSON, MICHAEL Primary Care Tija Biss: Lavera [name] Shayne Diguglielmo/Extender: G Referring Neah Sporrer: [name] in Treatment: 12 Wound Status Wound Number: 2 Primary Open Surgical Wound Etiology: Wound Location: Chest - Midline, Distal Wound Open Wounding Event: Surgical Injury Status: Date Acquired: 11/11/2015 Comorbid Cataracts, Anemia, Congestive Heart Weeks Of Treatment: 11 History: Failure, Coronary Artery Disease, Clustered Wound: No Hypertension, Type II Diabetes, Osteoarthritis, Neuropathy, Received Chemotherapy Wound Measurements Length: (cm) 0.1 Width: (cm) 0.1 Depth: (cm) 0.2 Area: (cm) 0.008 Volume: (cm) 0.002 % Reduction in Area: 97.1% % Reduction in Volume: 99.7% Epithelialization: None Tunneling: No Undermining: No Wound Description Full Thickness Without Exposed Foul Odor After C Classification: Support Structures Slough/Fibrino Wound Margin: Flat and Intact Exudate Large Amount: Exudate Type: Sanguinous Exudate Color: red leansing: No No Wound Bed Granulation Amount: Large (67-100%) Exposed Structure Granulation Quality: Red Fascia Exposed: No Necrotic Amount: None Present (0%) Fat Layer (Subcutaneous Tissue) Exposed: No Tendon Exposed: No Muscle Exposed: No Joint Exposed: No Bone Exposed: No Limited to Skin Breakdown Periwound Skin Texture Diesing, Yukie A. ([id number]) Texture Color No Abnormalities  Noted: No No Abnormalities Noted: No Callus: No Atrophie Blanche: No Crepitus: No Cyanosis: No Excoriation: No Ecchymosis: No Induration: No Erythema: No Rash: No Hemosiderin Staining: No Scarring: No Mottled: No Pallor: No Moisture Rubor: No No Abnormalities Noted: No Dry / Scaly: No Temperature / Pain Maceration: No Temperature: No Abnormality Wound Preparation Ulcer Cleansing: Rinsed/Irrigated with Saline Topical Anesthetic Applied: None Treatment Notes Wound #2 (Distal, Midline Chest) 1. Cleansed with: Clean wound with Normal Saline 4. Dressing Applied: Iodosorb Ointment Other dressing (specify in notes) Notes coverlet Electronic Signature(s) Signed: 03/17/2016 5:13:29 PM By: Montey Hora Entered By: Montey Hora on 03/17/2016 11:14:05 Davidson, Cyrilla A. ([id number]) -------------------------------------------------------------------------------- Vitals Details Patient Name: Korzeniewski, Elizabet A. Date of Service: 03/17/2016 11:00 AM Medical Record Patient Account Number: [id number] Number: Treating RN: Montey Hora 04-19-1940 (75 y.o. Other Clinician: Date of Birth/Sex: Female) Treating ROBSON, MICHAEL Primary Care Esiah Bazinet: Lavera [name] Sidney Silberman/Extender: G Referring Traeh Milroy: [name] in Treatment: 12 Vital Signs Time Taken: 11:02 Temperature (F): 97.4 Height (in): 62 Pulse (bpm): 81 Weight (lbs): 161 Respiratory Rate (breaths/min): 16 Body Mass Index (BMI): 29.4 Blood Pressure (mmHg): 145/61 Reference Range: 80 - 120 mg / dl Electronic Signature(s) Signed: 03/17/2016 5:13:29 PM By: Montey Hora Entered By: Montey Hora on 03/17/2016 11:02:48

## 2016-03-23 ENCOUNTER — Ambulatory Visit

## 2016-03-25 ENCOUNTER — Ambulatory Visit

## 2016-03-27 ENCOUNTER — Encounter: Payer: Self-pay | Admitting: Emergency Medicine

## 2016-03-27 ENCOUNTER — Ambulatory Visit: Payer: Medicare Other

## 2016-03-27 ENCOUNTER — Ambulatory Visit (INDEPENDENT_AMBULATORY_CARE_PROVIDER_SITE_OTHER)
Admission: EM | Admit: 2016-03-27 | Discharge: 2016-03-27 | Disposition: A | Discharge status: Home / Self Care | Payer: Medicare Other | Source: Home / Self Care | Attending: Emergency Medicine | Admitting: Emergency Medicine

## 2016-03-27 ENCOUNTER — Emergency Department
Admission: EM | Admit: 2016-03-27 | Discharge: 2016-03-28 | Disposition: A | Discharge status: Home / Self Care | Payer: Medicare Other | Attending: Emergency Medicine | Admitting: Emergency Medicine

## 2016-03-27 DIAGNOSIS — N201 Calculus of ureter: Secondary | ICD-10-CM | POA: Diagnosis not present

## 2016-03-27 DIAGNOSIS — Z7984 Long term (current) use of oral hypoglycemic drugs: Secondary | ICD-10-CM | POA: Insufficient documentation

## 2016-03-27 DIAGNOSIS — I110 Hypertensive heart disease with heart failure: Secondary | ICD-10-CM | POA: Diagnosis not present

## 2016-03-27 DIAGNOSIS — Z882 Allergy status to sulfonamides status: Secondary | ICD-10-CM | POA: Insufficient documentation

## 2016-03-27 DIAGNOSIS — I509 Heart failure, unspecified: Secondary | ICD-10-CM | POA: Insufficient documentation

## 2016-03-27 DIAGNOSIS — R1032 Left lower quadrant pain: Secondary | ICD-10-CM

## 2016-03-27 DIAGNOSIS — E119 Type 2 diabetes mellitus without complications: Secondary | ICD-10-CM | POA: Insufficient documentation

## 2016-03-27 DIAGNOSIS — Z888 Allergy status to other drugs, medicaments and biological substances status: Secondary | ICD-10-CM | POA: Insufficient documentation

## 2016-03-27 DIAGNOSIS — I252 Old myocardial infarction: Secondary | ICD-10-CM | POA: Insufficient documentation

## 2016-03-27 DIAGNOSIS — E7800 Pure hypercholesterolemia, unspecified: Secondary | ICD-10-CM | POA: Insufficient documentation

## 2016-03-27 DIAGNOSIS — Z8673 Personal history of transient ischemic attack (TIA), and cerebral infarction without residual deficits: Secondary | ICD-10-CM

## 2016-03-27 DIAGNOSIS — Z951 Presence of aortocoronary bypass graft: Secondary | ICD-10-CM | POA: Insufficient documentation

## 2016-03-27 DIAGNOSIS — I25810 Atherosclerosis of coronary artery bypass graft(s) without angina pectoris: Secondary | ICD-10-CM | POA: Insufficient documentation

## 2016-03-27 DIAGNOSIS — F419 Anxiety disorder, unspecified: Secondary | ICD-10-CM | POA: Insufficient documentation

## 2016-03-27 DIAGNOSIS — Z7982 Long term (current) use of aspirin: Secondary | ICD-10-CM | POA: Diagnosis not present

## 2016-03-27 DIAGNOSIS — Z9884 Bariatric surgery status: Secondary | ICD-10-CM | POA: Insufficient documentation

## 2016-03-27 DIAGNOSIS — Z79899 Other long term (current) drug therapy: Secondary | ICD-10-CM | POA: Insufficient documentation

## 2016-03-27 DIAGNOSIS — I255 Ischemic cardiomyopathy: Secondary | ICD-10-CM | POA: Insufficient documentation

## 2016-03-27 DIAGNOSIS — C9000 Multiple myeloma not having achieved remission: Secondary | ICD-10-CM

## 2016-03-27 DIAGNOSIS — Z880 Allergy status to penicillin: Secondary | ICD-10-CM | POA: Insufficient documentation

## 2016-03-27 LAB — COMPREHENSIVE METABOLIC PANEL
ALBUMIN: 4.1 g/dL (ref 3.5–5.0)
ALT: 10 U/L — AB (ref 14–54)
AST: 19 U/L (ref 15–41)
Alkaline Phosphatase: 58 U/L (ref 38–126)
Anion gap: 8 (ref 5–15)
BUN: 29 mg/dL — ABNORMAL HIGH (ref 6–20)
CHLORIDE: 100 mmol/L — AB (ref 101–111)
CO2: 25 mmol/L (ref 22–32)
Calcium: 9.6 mg/dL (ref 8.9–10.3)
Creatinine, Ser: 1.49 mg/dL — ABNORMAL HIGH (ref 0.44–1.00)
GFR calc Af Amer: 39 mL/min — ABNORMAL LOW (ref >60)
GFR calc non Af Amer: 33 mL/min — ABNORMAL LOW (ref >60)
GLUCOSE: 218 mg/dL — AB (ref 65–99)
POTASSIUM: 4.9 mmol/L (ref 3.5–5.1)
SODIUM: 133 mmol/L — AB (ref 135–145)
Total Bilirubin: 0.7 mg/dL (ref 0.3–1.2)
Total Protein: 7.4 g/dL (ref 6.5–8.1)

## 2016-03-27 LAB — CBC
HEMATOCRIT: 36.4 % (ref 35.0–47.0)
Hemoglobin: 12.2 g/dL (ref 12.0–16.0)
MCH: 26.3 pg (ref 26.0–34.0)
MCHC: 33.4 g/dL (ref 32.0–36.0)
MCV: 78.7 fL — AB (ref 80.0–100.0)
Platelets: 212 10*3/uL (ref 150–440)
RBC: 4.62 MIL/uL (ref 3.80–5.20)
RDW: 16 % — AB (ref 11.5–14.5)
WBC: 10.2 10*3/uL (ref 3.6–11.0)

## 2016-03-27 LAB — URINALYSIS, COMPLETE (UACMP) WITH MICROSCOPIC
BACTERIA UA: NONE SEEN
BILIRUBIN URINE: NEGATIVE
Glucose, UA: 50 mg/dL — AB
Ketones, ur: NEGATIVE mg/dL
LEUKOCYTES UA: NEGATIVE
Nitrite: NEGATIVE
Protein, ur: NEGATIVE mg/dL
SPECIFIC GRAVITY, URINE: 1.019 (ref 1.005–1.030)
pH: 5 (ref 5.0–8.0)

## 2016-03-27 MED ORDER — IOPAMIDOL (ISOVUE-300) INJECTION 61%
30.0000 mL | Freq: Once | INTRAVENOUS | Status: AC
  Administered 2016-03-27: 30 mL via ORAL

## 2016-03-27 NOTE — ED Provider Notes (Signed)
CSN: [id number]     Arrival date & time 03/27/16  1440 History   First MD Initiated Contact with Patient 03/27/16 1615     Chief Complaint  Patient presents with  . Abdominal Pain   (Consider location/radiation/quality/duration/timing/severity/associated sxs/prior Treatment) HPI  76 year old female presents with left-sided lower quadrant abdominal pain. She said it started this morning and has worsened throughout the day. Had one episode of vomiting while in our clinic.Denies fever or chills. She states that she has increased flatulence. The pain is constant. She's had no diarrhea. His numerous comorbidities including a history of gastric bypass on artery disease requiring CABG 2 and stenting. Diabetes mellitus type 2 angina multiple myeloma      Past Medical History:  Diagnosis Date  . Anginal pain ([place])   . Anxiety   . Arthritis    "shoulders primarily" (01/13/2016)  . CHF (congestive heart failure) ([place])   . Chronic pain    from her multiple myeloma but says that her pain is usually in her legs/notes 01/13/2016  . High cholesterol   . History of blood transfusion    "numerous; related to procedures for my heart" (01/13/2016)  . Hypertension   . Ischemic cardiomyopathy    /nots 01/13/2016  . MI (myocardial infarction)    EKG on arrival 01/13/2016 showed NSR with evidence of old anterior and inferior infarcts  . MI (myocardial infarction) 08/2015  . Multiple myeloma ([place]) dx'd 2015  . PAD (peripheral artery disease) ([place])   . Stroke [facility])    "several in 1 year; ? year" (01/13/2016)  . Type II diabetes mellitus ([place])    Past Surgical History:  Procedure Laterality Date  . CARDIAC CATHETERIZATION N/A 01/14/2016   Procedure: Left Heart Cath and Cors/Grafts Angiography;  Surgeon: Burnell Blanks, MD;  Location: [place] [facility];  Service: Cardiovascular;  Laterality: N/A;  . CARPAL TUNNEL RELEASE Left   . CORONARY ANGIOPLASTY WITH STENT PLACEMENT  05/2015   DES placed to the mid  and distal LAD as well as OM2/notes 01/13/2016  . CORONARY ANGIOPLASTY WITH STENT PLACEMENT  ~ 2010  . CORONARY ARTERY BYPASS GRAFT  11/11/2015   CABG X2 LIMA-LAD and SVG-OM/notes 01/13/2016  . DILATION AND CURETTAGE OF UTERUS    . FEMORAL-POPLITEAL BYPASS GRAFT Left 2013   Archie Endo 01/13/2016  . LAPAROSCOPIC GASTRIC BANDING  2007  . RENAL ARTERY STENT    . VAGINAL HYSTERECTOMY     Family History  Problem Relation Age of Onset  . Diabetes Mother   . Heart failure Mother    Social History  Substance Use Topics  . Smoking status: Never Smoker  . Smokeless tobacco: Never Used  . Alcohol use Yes     Comment: occasional wine   OB History    No data available     Review of Systems  Constitutional: Positive for activity change, appetite change and fatigue. Negative for chills and fever.  Gastrointestinal: Positive for abdominal pain, nausea and vomiting.  All other systems reviewed and are negative.   Allergies  Other; Amoxicillin; Codeine; Duloxetine; Statins; and Sulfa antibiotics  Home Medications   Prior to Admission medications   Medication Sig Start Date End Date Taking? Authorizing Provider  Alirocumab (PRALUENT) 75 MG/ML SOPN Inject 1 pen into the skin every 14 (fourteen) days. 03/17/16  Yes Dorothy Spark, MD  aspirin 81 MG tablet Take 81 mg by mouth daily.   Yes Historical Provider, MD  Cholecalciferol (VITAMIN D3) 50000 units CAPS Take  1 capsule by mouth 2 (two) times a week.   Yes Historical Provider, MD  clopidogrel (PLAVIX) 75 MG tablet Take 75 mg by mouth daily.   Yes Historical Provider, MD  diclofenac sodium (VOLTAREN) 1 % GEL Apply 2 g topically daily as needed for pain.   Yes Historical Provider, MD  fentaNYL (DURAGESIC - DOSED MCG/HR) 50 MCG/HR Place 50 mcg onto the skin every 3 (three) days.   Yes Historical Provider, MD  gabapentin (NEURONTIN) 300 MG capsule Take 300-600 mg by mouth See admin instructions. Pt takes 655m in am, 3025mat bedtime   Yes Historical  Provider, MD  hydrocortisone cream 1 % Apply topically as needed for itching. 01/15/16  Yes ErArbutus LeasNP  HYDROmorphone (DILAUDID) 2 MG tablet Take 2 mg by mouth every 4 (four) hours as needed for severe pain.   Yes Historical Provider, MD  isosorbide mononitrate (IMDUR) 30 MG 24 hr tablet Take 1 tablet (30 mg total) by mouth daily. 02/23/16 05/23/16 Yes KaDorothy SparkMD  lisinopril (PRINIVIL,ZESTRIL) 5 MG tablet Take 1 tablet (5 mg total) by mouth daily. 01/16/16  Yes ErArbutus LeasNP  metFORMIN (GLUCOPHAGE) 500 MG tablet Take 1,000 mg by mouth 2 (two) times daily with a meal.    Yes Historical Provider, MD  metoprolol tartrate (LOPRESSOR) 25 MG tablet Take 0.5 tablets (12.5 mg total) by mouth 2 (two) times daily. 02/23/16 05/23/16 Yes KaDorothy SparkMD  nitroGLYCERIN (NITROSTAT) 0.4 MG SL tablet Place 1 tablet (0.4 mg total) under the tongue every 5 (five) minutes as needed for chest pain. 01/15/16  Yes ErArbutus LeasNP  ondansetron (ZOFRAN-ODT) 4 MG disintegrating tablet Take 4 mg by mouth every 8 (eight) hours as needed for nausea or vomiting.   Yes Historical Provider, MD  senna-docusate (SENOKOT-S) 8.6-50 MG tablet Take 2 tablets by mouth 2 (two) times daily as needed for constipation. 11/17/15 11/16/16 Yes Historical Provider, MD   Meds Ordered and Administered this Visit  Medications - No data to display  BP (!) 110/46 (BP Location: Left Arm)   Pulse 71   Temp 98.2 F (36.8 C) (Oral)   Resp 18   Ht _0  (1.575 m)   Wt 145 lb (65.8 kg)   SpO2 99%   BMI 26.52 kg/m  No data found.   Physical Exam  Constitutional: She is oriented to person, place, and time. She appears well-developed and well-nourished. No distress.  HENT:  Head: Normocephalic and atraumatic.  Eyes: Pupils are equal, round, and reactive to light.  Neck: Normal range of motion.  Pulmonary/Chest: Effort normal and breath sounds normal. No respiratory distress. She has no wheezes. She has no rales.  Abdominal:  Soft. She exhibits distension. There is tenderness. There is no rebound and no guarding.  Examination of the abdomen shows  mild distention present. Hypertonic bowel sounds with loud growling  on occasion. Has a tympanitic percussion note. Is tender maximally over the left lower quadrant. Remanderr of the abdomen does not seem to be tender.  Musculoskeletal: Normal range of motion.  Neurological: She is alert and oriented to person, place, and time.  Skin: Skin is warm and dry. She is not diaphoretic.  Psychiatric: She has a normal mood and affect. Her behavior is normal. Judgment and thought content normal.  Nursing note and vitals reviewed.   Urgent Care Course     Procedures (including critical care time)  Labs Review Labs Reviewed - No data to display  Imaging Review Dg Abd Acute W/chest  Result Date: 03/27/2016 CLINICAL DATA:  Pt with LLQ pain since noon today. Pt hx of CHF, Ischemic cardiomyopathy, Multiple Myeloma,PAD, type II diabetes, hysterectomy, renal artery stent, gastric banding, coronary artery bypass graft, coronary angioplasty with stent, cardiac catheterization. EXAM: DG ABDOMEN ACUTE W/ 1V CHEST COMPARISON:  Chest radiograph 01/13/2016, CT 01/12/2017 FINDINGS: Sternotomy wires overlie normal cardiac silhouette. Lungs are clear. Gastric banding device in the region of the gastric cardia. The axis tubing is continuous. No dilated loops of large or small bowel. Gas and stool in the rectum. Moderate volume stool in the rectum and sigmoid colon. No pathologic calcifications. No organomegaly. No acute osseous abnormality vascular stents noted in the femoral arteries IMPRESSION: 1.  No acute cardiopulmonary process. 2. No bowel obstruction or free air. 3. Moderate volume stool in the colon rectum Electronically Signed   By: Suzy Bouchard M.D.   On: 03/27/2016 17:21     Visual Acuity Review  Right Eye Distance:   Left Eye Distance:   Bilateral Distance:    Right Eye Near:    Left Eye Near:    Bilateral Near:         MDM   1. LLQ abdominal pain    Discussion with the patient and have told her that the x ray of her abdomen did not show free air or bowel obstruction. However she does have significant pain and most probably has diverticulitis. Will need additional evaluation and imaging not available today for [place]. Agreeable to that. Transported to [facility] emergency department. Have her son pick her up and transport her.   Lorin Picket, PA-C 03/27/16 1810

## 2016-03-27 NOTE — ED Triage Notes (Addendum)
Pt c/o left lower quadrant abd pain since noon today; went to [facility] Urgent care but was sent here for evaluation; denies urinary s/s; last bowel movement was yesterday; pt says she has not had much intake today as she has had no appetite; pt awake and alert, talking in complete coherent sentences

## 2016-03-27 NOTE — ED Notes (Signed)
Pt took Dilaudid 2mg  tablet at noon when pain started, provided no relief; pt is currently wearing a Fentanyl patch on right upper leg, which was changed today

## 2016-03-27 NOTE — ED Triage Notes (Signed)
Patient states that she was sent by [facility] to be evaluated endometriosis.

## 2016-03-27 NOTE — ED Triage Notes (Signed)
Patient complains of left sided abdominal pain. Patient states that this pain has worsened throughout today since onset of this morning. Patient states that she has myeloma.

## 2016-03-27 NOTE — ED Provider Notes (Signed)
[facility] Emergency Department Provider Note   ____________________________________________   I have reviewed the triage vital signs and the nursing notes.   HISTORY  Chief Complaint Abdominal Pain   History limited by: Not Limited   HPI [name] is a 76 y.o. female who presents to the emergency department today from urgent care because of concerns for left lower quadrant abdominal pain. She states that the pain started this morning after she ate a couple of bites of biscuit. It has since progressively gotten worse throughout the day. The pain has not radiated to other parts of her body. She did feel little bit of nausea with the pain although this was an isolated event. Patient has not had a bowel movement today. No fevers. Had an abdominal x-ray done at urgent care which did not show any concerning bowel gas patterns.   Past Medical History:  Diagnosis Date  . Anginal pain ([place])   . Anxiety   . Arthritis    "shoulders primarily" (01/13/2016)  . CHF (congestive heart failure) ([place])   . Chronic pain    from her multiple myeloma but says that her pain is usually in her legs/notes 01/13/2016  . High cholesterol   . History of blood transfusion    "numerous; related to procedures for my heart" (01/13/2016)  . Hypertension   . Ischemic cardiomyopathy    /nots 01/13/2016  . MI (myocardial infarction)    EKG on arrival 01/13/2016 showed NSR with evidence of old anterior and inferior infarcts  . MI (myocardial infarction) 08/2015  . Multiple myeloma ([place]) dx'd 2015  . PAD (peripheral artery disease) ([place])   . Stroke [facility])    "several in 1 year; ? year" (01/13/2016)  . Type II diabetes mellitus [facility])     Patient Active Problem List   Diagnosis Date Noted  . Ischemic cardiomyopathy 02/23/2016  . Chills 02/23/2016  . Weight loss 02/23/2016  . Unstable angina pectoris ([place]) 01/13/2016  . Chest pain   . PVD (peripheral vascular disease) ([place])   .  Coronary artery disease involving coronary bypass graft of native heart with angina pectoris ([place])   . Peripheral arterial occlusive disease ([place]) 12/15/2015  . Acute blood loss anemia 11/13/2015  . Leukocytosis 11/13/2015  . Chronic pain 11/12/2015  . Acute postoperative pain 11/12/2015  . 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 Acute UTI 11/05/2015  . Edema of left lower extremity 06/16/2015  . Edema of upper extremity 06/16/2015  . Knee pain, left 06/14/2015  . AKI (acute kidney injury) ([place]) 06/11/2015  . Hyperkalemia 06/11/2015  . Hypomagnesemia 06/11/2015  . Acute leg pain, left 05/20/2015  . Multiple bruises 05/20/2015  . Recurrent urinary tract infection 03/26/2014  . Recurrent UTI 03/26/2014  . H/O gastric bypass 11/07/2013  . Peripheral neuropathy ([place]) 10/10/2013  . Encounter for antineoplastic chemotherapy 10/10/2013  . Multiple myeloma ([place]) 07/16/2013  . Critical ischemia of lower extremity 05/15/2013  . Critical lower limb ischemia 05/15/2013  . Intermittent claudication ([place]) 07/27/2011  . Chronic coronary artery disease 04/26/2011  . Gastroesophageal reflux disease 04/26/2011  . Type 2 diabetes mellitus ([place]) 03/30/2011  . Hyperlipidemia, unspecified 03/30/2011  . Hypertension 03/30/2011    Past Surgical History:  Procedure Laterality Date  . CARDIAC CATHETERIZATION N/A 01/14/2016   Procedure: Left Heart Cath and Cors/Grafts Angiography;  Surgeon: Burnell Blanks, MD;  Location: [place] [facility];  Service: Cardiovascular;  Laterality: N/A;  . CARPAL TUNNEL RELEASE Left   .  CORONARY ANGIOPLASTY WITH STENT PLACEMENT  05/2015   DES placed to the mid and distal LAD as well as OM2/notes 01/13/2016  . CORONARY ANGIOPLASTY WITH STENT PLACEMENT  ~ 2010  . CORONARY ARTERY BYPASS GRAFT  11/11/2015   CABG X2 LIMA-LAD and SVG-OM/notes 01/13/2016  . DILATION AND CURETTAGE OF UTERUS    . FEMORAL-POPLITEAL BYPASS GRAFT Left 2013   Archie Endo 01/13/2016  . LAPAROSCOPIC GASTRIC BANDING   2007  . RENAL ARTERY STENT    . VAGINAL HYSTERECTOMY      Prior to Admission medications   Medication Sig Start Date End Date Taking? Authorizing Provider  Alirocumab (PRALUENT) 75 MG/ML SOPN Inject 1 pen into the skin every 14 (fourteen) days. 03/17/16  Yes Dorothy Spark, MD  aspirin 81 MG tablet Take 81 mg by mouth daily.   Yes Historical Provider, MD  Cholecalciferol (VITAMIN D3) 50000 units CAPS Take 1 capsule by mouth 2 (two) times a week.   Yes Historical Provider, MD  clopidogrel (PLAVIX) 75 MG tablet Take 75 mg by mouth daily.   Yes Historical Provider, MD  diclofenac sodium (VOLTAREN) 1 % GEL Apply 2 g topically daily as needed for pain.   Yes Historical Provider, MD  fentaNYL (DURAGESIC - DOSED MCG/HR) 50 MCG/HR Place 50 mcg onto the skin every 3 (three) days.   Yes Historical Provider, MD  gabapentin (NEURONTIN) 300 MG capsule Take 300-600 mg by mouth See admin instructions. Pt takes 636m in am, 3057mat bedtime   Yes Historical Provider, MD  hydrocortisone cream 1 % Apply topically as needed for itching. 01/15/16  Yes ErArbutus LeasNP  HYDROmorphone (DILAUDID) 2 MG tablet Take 2 mg by mouth every 4 (four) hours as needed for severe pain.   Yes Historical Provider, MD  isosorbide mononitrate (IMDUR) 30 MG 24 hr tablet Take 1 tablet (30 mg total) by mouth daily. 02/23/16 05/23/16 Yes KaDorothy SparkMD  lisinopril (PRINIVIL,ZESTRIL) 5 MG tablet Take 1 tablet (5 mg total) by mouth daily. 01/16/16  Yes ErArbutus LeasNP  metFORMIN (GLUCOPHAGE) 500 MG tablet Take 1,000 mg by mouth 2 (two) times daily with a meal.    Yes Historical Provider, MD  metoprolol tartrate (LOPRESSOR) 25 MG tablet Take 0.5 tablets (12.5 mg total) by mouth 2 (two) times daily. 02/23/16 05/23/16 Yes KaDorothy SparkMD  nitroGLYCERIN (NITROSTAT) 0.4 MG SL tablet Place 1 tablet (0.4 mg total) under the tongue every 5 (five) minutes as needed for chest pain. 01/15/16  Yes ErArbutus LeasNP  ondansetron (ZOFRAN-ODT) 4 MG  disintegrating tablet Take 4 mg by mouth every 8 (eight) hours as needed for nausea or vomiting.   Yes Historical Provider, MD  senna-docusate (SENOKOT-S) 8.6-50 MG tablet Take 2 tablets by mouth 2 (two) times daily as needed for constipation. 11/17/15 11/16/16 Yes Historical Provider, MD    Allergies Other; Amoxicillin; Codeine; Duloxetine; Statins; and Sulfa antibiotics  Family History  Problem Relation Age of Onset  . Diabetes Mother   . Heart failure Mother     Social History Social History  Substance Use Topics  . Smoking status: Never Smoker  . Smokeless tobacco: Never Used  . Alcohol use Yes     Comment: occasional wine    Review of Systems  Constitutional: Negative for fever. Cardiovascular: Negative for chest pain. Respiratory: Negative for shortness of breath. Gastrointestinal: Positive for abdominal pain. Neurological: Negative for headaches, focal weakness or numbness.  10-point ROS otherwise negative.  ____________________________________________  PHYSICAL EXAM:  VITAL SIGNS: ED Triage Vitals  Enc Vitals Group     BP 03/27/16 1954 (!) 119/52     Pulse Rate 03/27/16 1954 74     Resp 03/27/16 1954 18     Temp 03/27/16 1954 98.2 F (36.8 C)     Temp Source 03/27/16 1954 Oral     SpO2 03/27/16 1954 99 %     Weight 03/27/16 1954 145 lb (65.8 kg)     Height 03/27/16 1954 _0  (1.575 m)     Head Circumference --      Peak Flow --      Pain Score 03/27/16 1955 8   Constitutional: Alert and oriented. Well appearing and in no distress. Eyes: Conjunctivae are normal. Normal extraocular movements. ENT   Head: Normocephalic and atraumatic.   Nose: No congestion/rhinnorhea.   Mouth/Throat: Mucous membranes are moist.   Neck: No stridor. Hematological/Lymphatic/Immunilogical: No cervical lymphadenopathy. Cardiovascular: Normal rate, regular rhythm.  No murmurs, rubs, or gallops.  Respiratory: Normal respiratory effort without tachypnea nor  retractions. Breath sounds are clear and equal bilaterally. No wheezes/rales/rhonchi. Gastrointestinal: Soft and tender to palpation in the left lower quadrant. No rebound. No guarding.  Genitourinary: Deferred Musculoskeletal: Normal range of motion in all extremities. No lower extremity edema. Neurologic:  Normal speech and language. No gross focal neurologic deficits are appreciated.  Skin:  Skin is warm, dry and intact. No rash noted. Psychiatric: Mood and affect are normal. Speech and behavior are normal. Patient exhibits appropriate insight and judgment.  ____________________________________________    LABS (pertinent positives/negatives)  Labs Reviewed  COMPREHENSIVE METABOLIC PANEL - Abnormal; Notable for the following:       Result Value   Sodium 133 (*)    Chloride 100 (*)    Glucose, Bld 218 (*)    BUN 29 (*)    Creatinine, Ser 1.49 (*)    ALT 10 (*)    GFR calc non Af Amer 33 (*)    GFR calc Af Amer 39 (*)    All other components within normal limits  CBC - Abnormal; Notable for the following:    MCV 78.7 (*)    RDW 16.0 (*)    All other components within normal limits  URINALYSIS, COMPLETE (UACMP) WITH MICROSCOPIC - Abnormal; Notable for the following:    Color, Urine YELLOW (*)    APPearance CLEAR (*)    Glucose, UA 50 (*)    Hgb urine dipstick LARGE (*)    Squamous Epithelial / LPF 0-5 (*)    All other components within normal limits     ____________________________________________   EKG  None  ____________________________________________    RADIOLOGY  CT abd/pel pending  ____________________________________________   PROCEDURES  Procedures  ____________________________________________   INITIAL IMPRESSION / ASSESSMENT AND PLAN / ED COURSE  Pertinent labs & imaging results that were available during my care of the patient were reviewed by me and considered in my medical decision making (see chart for details).  Patient presented to  the emergency department today with concerns for left lower quadrant abdominal pain. Exam she does have some tenderness in that area. Will obtain a CT scan to evaluate for diverticulitis. I think less likely would be kidney stone or aortic issue.  ____________________________________________   FINAL CLINICAL IMPRESSION(S) / ED DIAGNOSES  Left lower quadrant pain  Note: This dictation was prepared with Dragon dictation. Any transcriptional errors that result from this process are unintentional     Nance Pear,  MD 03/27/16 2245

## 2016-03-28 ENCOUNTER — Emergency Department: Payer: Medicare Other

## 2016-03-28 ENCOUNTER — Encounter: Payer: Self-pay | Admitting: Radiology

## 2016-03-28 DIAGNOSIS — N201 Calculus of ureter: Secondary | ICD-10-CM | POA: Diagnosis not present

## 2016-03-28 MED ORDER — IOPAMIDOL (ISOVUE-300) INJECTION 61%
75.0000 mL | Freq: Once | INTRAVENOUS | Status: AC | PRN
  Administered 2016-03-28: 75 mL via INTRAVENOUS

## 2016-03-28 MED ORDER — IOPAMIDOL (ISOVUE-300) INJECTION 61%: 60.0000 mL | Freq: Once | INTRAVENOUS | Status: DC | PRN

## 2016-03-28 NOTE — ED Provider Notes (Signed)
Patient received in sign-out from Dr.  Archie Balboa.  Workup and evaluation pending CT abd.  CT iamging with evidence of 63mm stone which would explain her pain.  No evidence of infection.  Pain improved.  Does not want any additional pain medications.  Discussed results of CT scan.  Patient with pain and antiemetic meds at home.  Have discussed with the patient and available family all diagnostics and treatments performed thus far and all questions were answered to the best of my ability. The patient demonstrates understanding and agreement with plan.       Merlyn Lot, MD 03/28/16 [id number]

## 2016-03-28 NOTE — ED Notes (Signed)
Pt returned from CT °

## 2016-03-30 ENCOUNTER — Ambulatory Visit

## 2016-03-30 ENCOUNTER — Ambulatory Visit (INDEPENDENT_AMBULATORY_CARE_PROVIDER_SITE_OTHER): Payer: Medicare Other | Admitting: Urology

## 2016-03-30 VITALS — BP 111/57 | HR 71 | Ht 62.0 in | Wt 156.6 lb

## 2016-03-30 DIAGNOSIS — I2510 Atherosclerotic heart disease of native coronary artery without angina pectoris: Secondary | ICD-10-CM | POA: Diagnosis not present

## 2016-03-30 DIAGNOSIS — N200 Calculus of kidney: Secondary | ICD-10-CM | POA: Diagnosis not present

## 2016-03-30 DIAGNOSIS — N201 Calculus of ureter: Secondary | ICD-10-CM

## 2016-03-30 MED ORDER — ALFUZOSIN HCL ER 10 MG PO TB24: 10.0000 mg | ORAL_TABLET | Freq: Every day | ORAL | 0 refills | Status: DC

## 2016-03-30 NOTE — Progress Notes (Signed)
03/30/2016 3:44 PM   [name] Sep 25, 1940 [id number]  Referring provider: Lynnell Jude, MD [address] [place], [place] [address]  Chief Complaint  Patient presents with  . New Patient (Initial Visit)    Nephrolithiasis    HPI:  This 76 year old seen for ureteral stone today. She had left lower quadrant and left flank pain and plain xray was equivocal. On 03/28/2016 she underwent a CT scan which revealed a 3 mm left proximal ureteral stone. Minimal hydro, but bilateral extrarenal pelvis. Her white count was 10.2, BUN 29, creatinine 1.49 (up from 1.26).  UA showed 0-5 white cells, 6-30 red cells.  She hasn't seen a stone pass. Pain much improved. Now she has urgency, frequency symptoms. UA today clear. Few bacteria. No prior stones.   She has recurrent UTI which sound like asymptomatic bacteruria. She's on nightly cephalexin.   PMH: Past Medical History:  Diagnosis Date  . Anginal pain ([place])   . Anxiety   . Arthritis    "shoulders primarily" (01/13/2016)  . CHF (congestive heart failure) ([place])   . Chronic pain    from her multiple myeloma but says that her pain is usually in her legs/notes 01/13/2016  . High cholesterol   . History of blood transfusion    "numerous; related to procedures for my heart" (01/13/2016)  . Hypertension   . Ischemic cardiomyopathy    /nots 01/13/2016  . MI (myocardial infarction)    EKG on arrival 01/13/2016 showed NSR with evidence of old anterior and inferior infarcts  . MI (myocardial infarction) 08/2015  . Multiple myeloma ([place]) dx'd 2015  . PAD (peripheral artery disease) ([place])   . Stroke [facility])    "several in 1 year; ? year" (01/13/2016)  . Type II diabetes mellitus ([place])     Surgical History: Past Surgical History:  Procedure Laterality Date  . CARDIAC CATHETERIZATION N/A 01/14/2016   Procedure: Left Heart Cath and Cors/Grafts Angiography;  Surgeon: Burnell Blanks, MD;  Location: [facility];  Service: Cardiovascular;   Laterality: N/A;  . CARPAL TUNNEL RELEASE Left   . CORONARY ANGIOPLASTY WITH STENT PLACEMENT  05/2015   DES placed to the mid and distal LAD as well as OM2/notes 01/13/2016  . CORONARY ANGIOPLASTY WITH STENT PLACEMENT  ~ 2010  . CORONARY ARTERY BYPASS GRAFT  11/11/2015   CABG X2 LIMA-LAD and SVG-OM/notes 01/13/2016  . DILATION AND CURETTAGE OF UTERUS    . FEMORAL-POPLITEAL BYPASS GRAFT Left 2013   Archie Endo 01/13/2016  . LAPAROSCOPIC GASTRIC BANDING  2007  . RENAL ARTERY STENT    . VAGINAL HYSTERECTOMY      Home Medications:  Allergies as of 03/30/2016      Reactions   Other Swelling   Smoke--Head swelling   Statins Swelling   Amoxicillin    Codeine    Sulfa Antibiotics    Duloxetine Nausea Only      Medication List       Accurate as of 03/30/16  3:44 PM. Always use your most recent med list.          Alirocumab 75 MG/ML Sopn Commonly known as:  PRALUENT Inject 1 pen into the skin every 14 (fourteen) days.   aspirin 81 MG tablet Take 81 mg by mouth daily.   clopidogrel 75 MG tablet Commonly known as:  PLAVIX Take 75 mg by mouth daily.   diclofenac sodium 1 % Gel Commonly known as:  VOLTAREN Apply 2 g topically daily as needed for  pain.   fentaNYL 50 MCG/HR Commonly known as:  DURAGESIC - dosed mcg/hr Place 50 mcg onto the skin every 3 (three) days.   gabapentin 300 MG capsule Commonly known as:  NEURONTIN Take 300-600 mg by mouth See admin instructions. Pt takes 642m in am, 3057mat bedtime   hydrocortisone cream 1 % Apply topically as needed for itching.   HYDROmorphone 2 MG tablet Commonly known as:  DILAUDID Take 2 mg by mouth every 4 (four) hours as needed for severe pain.   isosorbide mononitrate 30 MG 24 hr tablet Commonly known as:  IMDUR Take 1 tablet (30 mg total) by mouth daily.   lisinopril 5 MG tablet Commonly known as:  PRINIVIL,ZESTRIL Take 1 tablet (5 mg total) by mouth daily.   metFORMIN 500 MG tablet Commonly known as:  GLUCOPHAGE Take  1,000 mg by mouth 2 (two) times daily with a meal.   metoprolol tartrate 25 MG tablet Commonly known as:  LOPRESSOR Take 0.5 tablets (12.5 mg total) by mouth 2 (two) times daily.   nitroGLYCERIN 0.4 MG SL tablet Commonly known as:  NITROSTAT Place 1 tablet (0.4 mg total) under the tongue every 5 (five) minutes as needed for chest pain.   ondansetron 4 MG disintegrating tablet Commonly known as:  ZOFRAN-ODT Take 4 mg by mouth every 8 (eight) hours as needed for nausea or vomiting.   senna-docusate 8.6-50 MG tablet Commonly known as:  Senokot-S Take 2 tablets by mouth 2 (two) times daily as needed for constipation.   Vitamin D3 50000 units Caps Take 1 capsule by mouth 2 (two) times a week.       Allergies:  Allergies  Allergen Reactions  . Other Swelling    Smoke--Head swelling  . Statins Swelling  . Amoxicillin   . Codeine   . Sulfa Antibiotics   . Duloxetine Nausea Only    Family History: Family History  Problem Relation Age of Onset  . Diabetes Mother   . Heart failure Mother     Social History:  reports that she has never smoked. She has never used smokeless tobacco. She reports that she drinks alcohol. She reports that she does not use drugs.  ROS:                                        Physical Exam: BP (!) 111/57   Pulse 71   Ht _0  (1.575 m)   Wt 71 kg (156 lb 9.6 oz)   BMI 28.64 kg/m   Constitutional:  Alert and oriented, No acute distress. HEENT: [place] AT, moist mucus membranes.  Trachea midline, no masses. Cardiovascular: No clubbing, cyanosis, or edema. Respiratory: Normal respiratory effort, no increased work of breathing. GI: Abdomen is soft, nontender, nondistended, no abdominal masses GU: No CVA tenderness.  Skin: No rashes, bruises or suspicious lesions. Lymph: No cervical or inguinal adenopathy. Neurologic: Grossly intact, no focal deficits, moving all 4 extremities. Psychiatric: Normal mood and  affect.  Laboratory Data: Lab Results  Component Value Date   WBC 10.2 03/27/2016   HGB 12.2 03/27/2016   HCT 36.4 03/27/2016   MCV 78.7 (L) 03/27/2016   PLT 212 03/27/2016    Lab Results  Component Value Date   CREATININE 1.49 (H) 03/27/2016    No results found for: PSA  No results found for: TESTOSTERONE  Lab Results  Component Value Date   HGBA1C  7.6 (H) 01/14/2016    Urinalysis    Component Value Date/Time   COLORURINE YELLOW (A) 03/27/2016 2152   APPEARANCEUR CLEAR (A) 03/27/2016 2152   LABSPEC 1.019 03/27/2016 2152   PHURINE 5.0 03/27/2016 2152   GLUCOSEU 50 (A) 03/27/2016 2152   HGBUR LARGE (A) 03/27/2016 2152   BILIRUBINUR NEGATIVE 03/27/2016 2152   KETONESUR NEGATIVE 03/27/2016 2152   PROTEINUR NEGATIVE 03/27/2016 2152   NITRITE NEGATIVE 03/27/2016 2152   LEUKOCYTESUR NEGATIVE 03/27/2016 2152    Pertinent Imaging: [facility], CT   Assessment & Plan:    1. Nephrolithiasis - stable   2. Ureteral stone - difficult to track these small stones. See in 1-2 weeks with a KUB. Start alfuzosin - discussed nature r/b of alpha blocker.   - Urinalysis, Complete   No Follow-up on file.  Festus Aloe, [facility] [address] [place], [place] [address] [phone]

## 2016-03-31 ENCOUNTER — Encounter: Payer: Medicare Other | Admitting: Internal Medicine

## 2016-03-31 DIAGNOSIS — L98498 Non-pressure chronic ulcer of skin of other sites with other specified severity: Secondary | ICD-10-CM | POA: Diagnosis not present

## 2016-03-31 LAB — URINALYSIS, COMPLETE
Bilirubin, UA: NEGATIVE
Ketones, UA: NEGATIVE
LEUKOCYTES UA: NEGATIVE
Nitrite, UA: NEGATIVE
Specific Gravity, UA: 1.02 (ref 1.005–1.030)
Urobilinogen, Ur: 0.2 mg/dL (ref 0.2–1.0)
pH, UA: 5 (ref 5.0–7.5)

## 2016-03-31 LAB — MICROSCOPIC EXAMINATION

## 2016-04-01 ENCOUNTER — Telehealth: Payer: Self-pay | Admitting: Gastroenterology

## 2016-04-01 ENCOUNTER — Other Ambulatory Visit
Admission: RE | Admit: 2016-04-01 | Discharge: 2016-04-01 | Disposition: A | Discharge status: Home / Self Care | Payer: Medicare Other | Source: Ambulatory Visit | Attending: Internal Medicine | Admitting: Internal Medicine

## 2016-04-01 ENCOUNTER — Ambulatory Visit

## 2016-04-01 DIAGNOSIS — B999 Unspecified infectious disease: Secondary | ICD-10-CM | POA: Diagnosis present

## 2016-04-01 NOTE — Progress Notes (Signed)
[name], [name] ([id number]) Visit Report for 03/31/2016 Chief Complaint Document Details Patient Name: [name], [name] Date of Service: 03/31/2016 10:15 AM Medical Record Patient Account Number: [id number] Number: Treating RN: [name] 1940/09/11 (76 y.o. Other Clinician: Date of Birth/Sex: Female) Treating [name] Primary Care Provider: Lavera [name] Provider/Extender: G Referring Provider: Sallee [name] in Treatment: 14 Information Obtained from: Patient Chief Complaint 12/24/15 patient is here for review of to nonhealing surgical wounds in a CABG scar Electronic Signature(s) Signed: 03/31/2016 4:37:19 PM By: [name] Ham MD Entered By: [name] on 03/31/2016 10:21:23 [name], [name] -------------------------------------------------------------------------------- HPI Details Patient Name: [name], [name] Date of Service: 03/31/2016 10:15 AM Medical Record Patient Account Number: [id number] Number: Treating RN: [name] 09-21-1940 (76 y.o. Other Clinician: Date of Birth/Sex: Female) Treating [name] Primary Care Provider: Lavera [name] Provider/Extender: G Referring Provider: Sallee [name] in Treatment: 14 History of Present Illness HPI Description: 12/24/15; [name] is a 76 year old woman who is the mother of one of our other clinic patient's [name] He comes [place] today for review a nonhealing surgical CABG wound. The patient underwent CABG o2 on October 31 17 at [facility]. She apparently had had a myocardial infarction in October but briefly presented with shortness of breath. She underwent surgery which I understand was uneventful. According to the patient her surgical incision never really healed in 2 spots. One is at the inferior recess question drain site, the other more superiorly about an inch from the superior surface of this it incision. She has been followed by her surgeon at  [name] who felt that both areas which he'll according to the patient. She was put on Augmentin 2 weeks ago and she is finished a course of this culture at that time showed Escherichia coli which should've been sensitive. The patient also relates that she has a history of allergy to ampicillin. Besides this the patient has a history of type 2 diabetes on metformin, multiple myeloma. She had a chest x-ray in mid October the did not show any abnormalities. I cannot see that she had any specific views of the sternum however. We have her referral note from her primary physician Dr. Bernie [name]. She was concerned about erythema around the wound and as mentioned the culture here showed Escherichia coli which should have been Augmentin sensitive but was resistant to ampicillin, Keflex third generation cephalosporins quinolones and sulfonamides. [ESBL). The patient does not complain of fever or chills excessive pain. She does [name] that the lower surgical wound has been draining what looks to her like pus. She has been using triple antibiotic cream. 12/31/15 superior wound actually looks somewhat better. Inferiorly still a wound with some depth greater than 2 cm. Using Aquacel Ag. Culture I did of the drainage from the inferior wound and this surgical incision last week was negative 01/07/16; superior wound still requires debridement of surface eschar and nonviable subcutaneous tissue. Inferior wound measured at 1.8 cm. What I can see of the tissue here does not look very healthy. Original culture that I did on her presentation was negative however 01/27/16; the superior wound is closed she still has a deep probing wound of roughly 2 cm inferiorly. There is no overt infection no surrounding erythema. The patient tells me that 2 weeks ago she was developing chest pain and underwent a nuclear stress test. This was apparently negative however she developed chest pain shortly thereafter and had to go have a  cardiac  catheterization that did not show blockages. Since then she is not having chest pain but has unrelenting nausea making it difficult for her to eat. We reviewed her medications and she is on aspirin and Plavix however she apparently has a stent in place as well. 02/03/16; still using silver alginate. Depth of this is down from 1.8 cm the circumference is smaller and the depth down by 2 mm red no drainage and no surrounding infection 02/10/16; still using silver alginate depth down to 1.3 cm today measured by myself. Appears to be making slow progress. There is no surrounding tenderness.. The patient states she has unrelenting nausea which is nonexertional. Not related to meals. She has had no vomiting. She also tells me she is had previous lap [name], [name] ([id number]) band surgery but does not give clear reflux type symptoms 02/25/16; the wound orifice of this lady's wound on her chest is down to 1 cm in depth and much smaller in terms of the overall diameter. She tells me that she was packing the wound recently and developed quite a bit of bleeding that took several hours to stop she is on Plavix and aspirin. She is not having any exertional symptoms that sound like coronary artery disease. She is complaining of unrelenting nausea but she is being evaluated by GI for this and apparently has an endoscopy planned for March. We have been using iodoform packing 03/03/16; small wound in terms of orifice but measures 1.2 cm in depth, not much different from last time. She still complains of nausea and has an upper GI series books she is not describing exertional chest pain, shortness of breath, nausea or fatigue. 03/16/16; I follow this patient for a nonhealing surgical wound at a site of a previous CABG scar. She developed cellulitis in the incision postoperatively. Today she arrives with the wound orifice to miniscule to even consider measuring a depth. She came in with a Band-Aid over this, she  could not get any of the iodoform packing in the increasingly small orifice 03/31/16; the patient's nonhealing surgical wound at the site of her previous CABG scar/inferior aspect is totally epithelialized. She tells me there was some bleeding out of this which was minimal last week however with careful inspection there is no evidence of any open here everything looks healthy and epithelialize. Surrounding tissue also looks normal the patient arrives today with erythema and a significant blister mostly involving the dorsal aspect of her right fifth finger. There were 2 small almost looked like puncture wounds on the anterior aspect of the DIP joint and the medial aspect of the finger. The area was tender and warm. There was no involvement of any joint Electronic Signature(s) Signed: 03/31/2016 4:37:19 PM By: [name] Ham MD Entered By: [name] on 03/31/2016 10:22:58 Chou, [name] AMarland Kitchen ([id number]) -------------------------------------------------------------------------------- Physical Exam Details Patient Name: Geter, Kawena A. Date of Service: 03/31/2016 10:15 AM Medical Record Patient Account Number: [id number] Number: Treating RN: [name] [birth year] (76 y.o. Other Clinician: Date of Birth/Sex: Female) Treating Brenlyn Beshara Primary Care Provider: Lavera [name] Provider/Extender: G Referring Provider: Sallee [name] in Treatment: 14 Constitutional Sitting or standing Blood Pressure is within target range for patient.. Pulse regular and within target range for patient.Marland Kitchen Respirations regular, non-labored and within target range.. Temperature is normal and within the target range for the patient.. Patient's appearance is neat and clean. Appears in no acute distress. Well nourished and well developed.. Neck . Respiratory Respiratory effort is easy and symmetric bilaterally.  Rate is normal at rest and on room air.Marland Kitchen Lymphatic None palpable in the  epitrochlear. Musculoskeletal The right fifth distal interphalangeal joint and proximal interphalangeal joint on the right fifth finger were both normal no tenderness here no evidence of a crystal arthropathy. Notes Wound exam; the patient's wound for which she's been attending the Center is totally epithelialized and healed oShe arrives today with erythema and a blister over her dorsal aspect of her right fifth finger. The cause of this is not completely clear. This was tender. The area was opened with a #15 scalpel sero-sanguinous drainage sent for culture. This is all confined to an area between the PIP and DIP dorsally. Electronic Signature(s) Signed: 03/31/2016 4:37:19 PM By: [name] Ham MD Entered By: [name] on 03/31/2016 10:25:57 [name], [name] ([id number]) -------------------------------------------------------------------------------- Physician Orders Details Patient Name: [name], [name] Date of Service: 03/31/2016 10:15 AM Medical Record Patient Account Number: [id number] Number: Treating RN: [name] 01-21-1940 (76 y.o. Other Clinician: Date of Birth/Sex: Female) Treating Kacelyn Rowzee Primary Care Provider: Lavera [name] Provider/Extender: G Referring Provider: Sallee [name] in Treatment: 14 Verbal / Phone Orders: No Diagnosis Coding Wound Cleansing Wound #3 Right Hand - 5th Digit o May Shower, gently pat wound dry prior to applying new dressing. Anesthetic Wound #3 Right Hand - 5th Digit o Topical Lidocaine 4% cream applied to wound bed prior to debridement Primary Wound Dressing Wound #3 Right Hand - 5th Digit o Aquacel Ag Secondary Dressing Wound #3 Right Hand - 5th Digit o Other - coverlet Dressing Change Frequency Wound #3 Right Hand - 5th Digit o Change dressing every day. Follow-up Appointments Wound #3 Right Hand - 5th Digit o Return Appointment in 1 week. Laboratory o Bacteria identified in Wound by  Culture (MICRO) - right 5th finger oooo LOINC Code: 6295-2 WUXL Convenience Name: Wound culture routine Patient Medications [name], [name] ([id number]) Allergies: Aricept, Benzodiazepines, Opioids - Morphine Analogues, penicillin, Sulfa (Sulfonamide Antibiotics), Statins-Hmg-Coa Reductase Inhibitors Notifications Medication Indication Start End doxycycline monohydrate cellulitis finger 03/31/2016 DOSE bid - oral 100 mg capsule - bid capsule oral Electronic Signature(s) Signed: 03/31/2016 10:29:08 AM By: [name] Ham MD Entered By: [name] on 03/31/2016 10:29:08 [name], [name] ([id number]) -------------------------------------------------------------------------------- Problem List Details Patient Name: Shadden, Zhania A. Date of Service: 03/31/2016 10:15 AM Medical Record Patient Account Number: [id number] Number: Treating RN: [name] 09-29-1940 (76 y.o. 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 ([id number]) -------------------------------------------------------------------------------- Progress Note Details Patient Name: [name], [name] Date of Service: 03/31/2016 10:15 AM Medical Record Patient Account Number: [id number] Number: Treating RN:  [name] [birth year] (76 y.o. Other Clinician: Date of Birth/Sex: Female) Treating Crysta Gulick Primary Care Provider: Lavera [name] Provider/Extender: G Referring Provider: Sallee [name] in Treatment: 14 Subjective Chief Complaint Information obtained from Patient 12/24/15 patient is here for review of to nonhealing surgical wounds in a CABG scar History of Present Illness (HPI) 12/24/15; Mrs. Stankovich is a 76 year old woman  who is the mother of one of our other clinic patient's [name] Guitron. He comes [place] today for review a nonhealing surgical CABG wound. The patient underwent CABG o2 on October 31 17 at [facility]. She apparently had had a myocardial infarction in October but briefly presented with shortness of breath. She underwent surgery which I understand was uneventful. According to the patient her surgical incision never really healed in 2 spots. One is at the inferior recess question drain site, the other more superiorly about an inch from the superior surface of this it incision. She has been followed by her surgeon at [facility] who felt that both areas which he'll according to the patient. She was put on Augmentin 2 weeks ago and she is finished a course of this culture at that time showed Escherichia coli which should've been sensitive. The patient also relates that she has a history of allergy to ampicillin. Besides this the patient has a history of type 2 diabetes on metformin, multiple myeloma. She had a chest x-ray in mid October the did not show any abnormalities. I cannot see that she had any specific views of the sternum however. We have her referral note from her primary physician Dr. Bernie [name]. She was concerned about erythema around the wound and as mentioned the culture here showed Escherichia coli which should have been Augmentin sensitive but was resistant to ampicillin, Keflex third generation cephalosporins quinolones and sulfonamides.  [ESBL). The patient does not complain of fever or chills excessive pain. She does [name] that the lower surgical wound has been draining what looks to her like pus. She has been using triple antibiotic cream. 12/31/15 superior wound actually looks somewhat better. Inferiorly still a wound with some depth greater than 2 cm. Using Aquacel Ag. Culture I did of the drainage from the inferior wound and this surgical incision last week was negative 01/07/16; superior wound still requires debridement of surface eschar and nonviable subcutaneous tissue. Inferior wound measured at 1.8 cm. What I can see of the tissue here does not look very healthy. Original culture that I did on her presentation was negative however 01/27/16; the superior wound is closed she still has a deep probing wound of roughly 2 cm inferiorly. There is no overt infection no surrounding erythema. The patient tells me that 2 weeks ago she was developing chest pain and underwent a nuclear stress test. This was apparently negative however she developed chest pain shortly thereafter and had to go have a cardiac catheterization that did not show blockages. Since then she is not having chest pain but has unrelenting nausea making it difficult for her to eat. We reviewed her [name], [name] ([id number]) medications and she is on aspirin and Plavix however she apparently has a stent in place as well. 02/03/16; still using silver alginate. Depth of this is down from 1.8 cm the circumference is smaller and the depth down by 2 mm red no drainage and no surrounding infection 02/10/16; still using silver alginate depth down to 1.3 cm today measured by myself. Appears to be making slow progress. There is no surrounding tenderness.. The patient states she has unrelenting nausea which is nonexertional. Not related to meals. She has had no vomiting. She also tells me she is had previous lap band surgery but does not give clear reflux type  symptoms 02/25/16; the wound orifice of this lady's wound on her chest is down to 1 cm in depth and much smaller in terms of the overall  diameter. She tells me that she was packing the wound recently and developed quite a bit of bleeding that took several hours to stop she is on Plavix and aspirin. She is not having any exertional symptoms that sound like coronary artery disease. She is complaining of unrelenting nausea but she is being evaluated by GI for this and apparently has an endoscopy planned for March. We have been using iodoform packing 03/03/16; small wound in terms of orifice but measures 1.2 cm in depth, not much different from last time. She still complains of nausea and has an upper GI series books she is not describing exertional chest pain, shortness of breath, nausea or fatigue. 03/16/16; I follow this patient for a nonhealing surgical wound at a site of a previous CABG scar. She developed cellulitis in the incision postoperatively. Today she arrives with the wound orifice to miniscule to even consider measuring a depth. She came in with a Band-Aid over this, she could not get any of the iodoform packing in the increasingly small orifice 03/31/16; the patient's nonhealing surgical wound at the site of her previous CABG scar/inferior aspect is totally epithelialized. She tells me there was some bleeding out of this which was minimal last week however with careful inspection there is no evidence of any open here everything looks healthy and epithelialize. Surrounding tissue also looks normal the patient arrives today with erythema and a significant blister mostly involving the dorsal aspect of her right fifth finger. There were 2 small almost looked like puncture wounds on the anterior aspect of the DIP joint and the medial aspect of the finger. The area was tender and warm. There was no involvement of any joint Objective Constitutional Sitting or standing Blood Pressure is within  target range for patient.. Pulse regular and within target range for patient.Marland Kitchen Respirations regular, non-labored and within target range.. Temperature is normal and within the target range for the patient.. Patient's appearance is neat and clean. Appears in no acute distress. Well nourished and well developed.. Vitals Time Taken: 9:43 AM, Height: 62 in, Weight: 161 lbs, BMI: 29.4, Temperature: 97.8 F, Pulse: 70 bpm, Respiratory Rate: 18 breaths/min, Blood Pressure: 109/54 mmHg. Respiratory Respiratory effort is easy and symmetric bilaterally. Rate is normal at rest and on room air.[name], Sayre A. ([id number]) Lymphatic None palpable in the epitrochlear. Musculoskeletal The right fifth distal interphalangeal joint and proximal interphalangeal joint on the right fifth finger were both normal no tenderness here no evidence of a crystal arthropathy. General Notes: Wound exam; the patient's wound for which she's been attending the Center is totally epithelialized and healed She arrives today with erythema and a blister over her dorsal aspect of her right fifth finger. The cause of this is not completely clear. This was tender. The area was opened with a #15 scalpel sero-sanguinous drainage sent for culture. This is all confined to an area between the PIP and DIP dorsally. Integumentary (Hair, Skin) Wound #2 status is Open. Original cause of wound was Surgical Injury. The wound is located on the Distal,Midline Chest. The wound measures 0cm length x 0cm width x 0cm depth; 0cm^2 area and 0cm^3 volume. Wound #3 status is Open. Original cause of wound was Blister. The wound is located on the Right Hand - 5th Digit. The wound measures 3cm length x 2.5cm width x 0.1cm depth; 5.89cm^2 area and 0.589cm^3 volume. The wound is limited to skin breakdown. There is no tunneling or undermining noted. There is a large amount of serous drainage  noted. The wound margin is flat and intact. There is small (1-33%)  pink granulation within the wound bed. There is no necrotic tissue within the wound bed. The periwound skin appearance exhibited: Erythema. The periwound skin appearance did not exhibit: Callus, Crepitus, Excoriation, Induration, Rash, Scarring, Dry/Scaly, Maceration, Atrophie Blanche, Cyanosis, Ecchymosis, Hemosiderin Staining, Mottled, Pallor, Rubor. The surrounding wound skin color is noted with erythema which is circumferential. Periwound temperature was noted as Hot. The periwound has tenderness on palpation. 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 ([id number]) Wound Cleansing: Wound #3 Right Hand - 5th Digit: May Shower, gently pat wound dry prior to applying new dressing. Anesthetic: Wound #3 Right Hand - 5th Digit: Topical Lidocaine 4% cream applied to wound bed prior to debridement Primary Wound Dressing: Wound #3 Right Hand - 5th Digit: Aquacel Ag Secondary Dressing: Wound #3 Right Hand - 5th Digit: Other - coverlet Dressing Change Frequency: Wound #3 Right Hand - 5th Digit: Change dressing every day. Follow-up Appointments: Wound #3 Right Hand - 5th Digit: Return Appointment in 1 week. Laboratory ordered were: Wound culture routine - right 5th finger The following medication(s) was prescribed: doxycycline monohydrate oral 100 mg capsule bid bid capsule oral for cellulitis finger starting 03/31/2016 #1 what appears to be cellulitis in the right fifth finger with a blister. Culture of the fluid was obtained #2 I'll give her doxycycline 100 twice a day for 7 days she is allergic to penicillin and sulfa #3 the exact cause of this area on the fifth finger is unclear, she has a dog but she doesn't  think she was bitten or scratched, there is no evidence 7 active arthritis in either of the surrounding joints. Interestingly she says this is been recurrent and she's had this in this area before #4 her original wound is totally closed and I don't think requires any specific dressing. If not for the finger I would be discharging her from the clinic today. I've cautioned her that if the erythema extends beyond the finger into her hand she'll need to seek prompt medical attention Electronic Signature(s) Signed: 03/31/2016 10:29:28 AM By: [name] Ham MD Entered By: [name] on 03/31/2016 10:29:28 Yablonski, Makinzi A. ([id number]) -------------------------------------------------------------------------------- SuperBill Details Patient Name: Gondek, Lashaundra A. Date of Service: 03/31/2016 Medical Record Patient Account Number: [id number] Number: Treating RN: [name] 03-09-1940 (76 y.o. 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

## 2016-04-01 NOTE — Progress Notes (Signed)
[name] ([id number]) Visit Report for 03/31/2016 Arrival Information Details Patient Name: [name], [name] Date of Service: 03/31/2016 10:15 AM Medical Record Patient Account Number: [id number] Number: Treating RN: Montey Hora [birth year] (76 y.o. Other Clinician: Date of Birth/Sex: Female) Treating ROBSON, MICHAEL Primary Care Joshu Furukawa: Lavera [name] Joyceann Kruser/Extender: G Referring Jazelyn Sipe: [name] in Treatment: 14 Visit Information History Since Last Visit Added or deleted any medications: No Patient Arrived: Walker Any new allergies or adverse reactions: No Arrival Time: 09:43 Had a fall or experienced change in No Accompanied By: self activities of daily living that may affect Transfer Assistance: None risk of falls: Patient Identification Verified: Yes Signs or symptoms of abuse/neglect since last No Secondary Verification Process Completed: Yes visito Patient Requires Transmission-Based No Hospitalized since last visit: No Precautions: Has Dressing in Place as Prescribed: Yes Patient Has Alerts: Yes Pain Present Now: No Electronic Signature(s) Signed: 03/31/2016 4:58:21 PM By: Montey Hora Entered By: Montey Hora on 03/31/2016 09:43:19 Staley, Leotta A. ([id number]) -------------------------------------------------------------------------------- Clinic Level of Care Assessment Details Patient Name: Newnam, [name] Date of Service: 03/31/2016 10:15 AM Medical Record Patient Account Number: [id number] Number: Treating RN: Montey Hora 02/07/1940 (76 y.o. Other Clinician: Date of Birth/Sex: Female) Treating ROBSON, Tamms Primary Care Antrice Pal: Lavera [name] Jackalyn Haith/Extender: G Referring Diona Peregoy: [name] in Treatment: 14 Clinic Level of Care Assessment Items TOOL 4 Quantity Score []  - Use when only an EandM is performed on FOLLOW-UP visit 0 ASSESSMENTS - Nursing Assessment / Reassessment X - Reassessment of  Co-morbidities (includes updates in patient status) 1 10 X - Reassessment of Adherence to Treatment Plan 1 5 ASSESSMENTS - Wound and Skin Assessment / Reassessment []  - Simple Wound Assessment / Reassessment - one wound 0 X - Complex Wound Assessment / Reassessment - multiple wounds 2 5 []  - Dermatologic / Skin Assessment (not related to wound area) 0 ASSESSMENTS - Focused Assessment []  - Circumferential Edema Measurements - multi extremities 0 []  - Nutritional Assessment / Counseling / Intervention 0 []  - Lower Extremity Assessment (monofilament, tuning fork, pulses) 0 []  - Peripheral Arterial Disease Assessment (using hand held doppler) 0 ASSESSMENTS - Ostomy and/or Continence Assessment and Care []  - Incontinence Assessment and Management 0 []  - Ostomy Care Assessment and Management (repouching, etc.) 0 PROCESS - Coordination of Care X - Simple Patient / Family Education for ongoing care 1 15 []  - Complex (extensive) Patient / Family Education for ongoing care 0 []  - Staff obtains Programmer, systems, Records, Test Results / Process Orders 0 []  - Staff telephones HHA, Nursing Homes / Clarify orders / etc 0 Traore, Josaphine A. ([id number]) []  - Routine Transfer to another Facility (non-emergent condition) 0 []  - Routine Hospital Admission (non-emergent condition) 0 []  - New Admissions / Biomedical engineer / Ordering NPWT, Apligraf, etc. 0 []  - Emergency Hospital Admission (emergent condition) 0 X - Simple Discharge Coordination 1 10 []  - Complex (extensive) Discharge Coordination 0 PROCESS - Special Needs []  - Pediatric / Minor Patient Management 0 []  - Isolation Patient Management 0 []  - Hearing / Language / Visual special needs 0 []  - Assessment of Community assistance (transportation, D/C planning, etc.) 0 []  - Additional assistance / Altered mentation 0 []  - Support Surface(s) Assessment (bed, cushion, seat, etc.) 0 INTERVENTIONS - Wound Cleansing / Measurement []  - Simple Wound  Cleansing - one wound 0 X - Complex Wound Cleansing - multiple wounds 2 5 X - Wound Imaging (photographs - any number of wounds) 1 5 []  -  Wound Tracing (instead of photographs) 0 []  - Simple Wound Measurement - one wound 0 X - Complex Wound Measurement - multiple wounds 2 5 INTERVENTIONS - Wound Dressings X - Small Wound Dressing one or multiple wounds 1 10 []  - Medium Wound Dressing one or multiple wounds 0 []  - Large Wound Dressing one or multiple wounds 0 []  - Application of Medications - topical 0 []  - Application of Medications - injection 0 Deam, Theo A. ([id number]) INTERVENTIONS - Miscellaneous []  - External ear exam 0 X - Specimen Collection (cultures, biopsies, blood, body fluids, etc.) 1 5 []  - Specimen(s) / Culture(s) sent or taken to Lab for analysis 0 []  - Patient Transfer (multiple staff / Harrel Lemon Lift / Similar devices) 0 []  - Simple Staple / Suture removal (25 or less) 0 []  - Complex Staple / Suture removal (26 or more) 0 []  - Hypo / Hyperglycemic Management (close monitor of Blood Glucose) 0 []  - Ankle / Brachial Index (ABI) - do not check if billed separately 0 X - Vital Signs 1 5 Has the patient been seen at the hospital within the last three years: Yes Total Score: 95 Level Of Care: New/Established - Level 3 Electronic Signature(s) Signed: 03/31/2016 4:58:21 PM By: Montey Hora Entered By: Montey Hora on 03/31/2016 10:16:28 Ahlgren, Debria AMarland Kitchen ([id number]) -------------------------------------------------------------------------------- Encounter Discharge Information Details Patient Name: Custer, [name] Date of Service: 03/31/2016 10:15 AM Medical Record Patient Account Number: [id number] Number: Treating RN: Montey Hora Feb 14, 1940 (76 y.o. Other Clinician: Date of Birth/Sex: Female) Treating ROBSON, MICHAEL Primary Care Zakhia Seres: Lavera [name] Amoreena Neubert/Extender: G Referring Pao Haffey: [name] in Treatment: 14 Encounter Discharge  Information Items Discharge Pain Level: 0 Discharge Condition: Stable Ambulatory Status: Walker Discharge Destination: Home Transportation: Private Auto Accompanied By: self Schedule Follow-up Appointment: Yes Medication Reconciliation completed No and provided to Patient/Care Rubel Heckard: Provided on Clinical Summary of Care: 03/31/2016 Form Type Recipient Paper Patient AW Electronic Signature(s) Signed: 03/31/2016 10:17:38 AM By: Montey Hora Previous Signature: 03/31/2016 10:10:37 AM Version By: Ruthine Dose Entered By: Montey Hora on 03/31/2016 10:17:38 Claflin, Evalise A. ([id number]) -------------------------------------------------------------------------------- Multi Wound Chart Details Patient Name: Pella, Darian A. Date of Service: 03/31/2016 10:15 AM Medical Record Patient Account Number: [id number] Number: Treating RN: Montey Hora 08-01-1940 (75 y.o. Other Clinician: Date of Birth/Sex: Female) Treating ROBSON, MICHAEL Primary Care Caedyn Tassinari: Lavera [name] Donyell Carrell/Extender: G Referring Mckenzi Buonomo: [name] in Treatment: 14 Vital Signs Height(in): 62 Pulse(bpm): 70 Weight(lbs): 161 Blood Pressure 109/54 (mmHg): Body Mass Index(BMI): 29 Temperature(F): 97.8 Respiratory Rate 18 (breaths/min): Photos: [2:No Photos] [3:No Photos] [N/A:N/A] Wound Location: [2:Distal, Midline Chest] [3:Right Hand - 5th Digit] [N/A:N/A] Wounding Event: [2:Surgical Injury] [3:Blister] [N/A:N/A] Primary Etiology: [2:Open Surgical Wound] [3:Cellulitis] [N/A:N/A] Comorbid History: [2:N/A] [3:Cataracts, Anemia, Congestive Heart Failure, Coronary Artery Disease, Hypertension, Type II Diabetes, Osteoarthritis, Neuropathy, Received Chemotherapy] [N/A:N/A] Date Acquired: [2:11/11/2015] [3:03/24/2016] [N/A:N/A] Weeks of Treatment: [2:13] [3:0] [N/A:N/A] Wound Status: [2:Open] [3:Open] [N/A:N/A] Measurements L x W x D 0x0x0 [3:3x2.5x0.1] [N/A:N/A] (cm) Area (cm) : [2:0]  [3:5.89] [N/A:N/A] Volume (cm) : [2:0] [3:0.589] [N/A:N/A] % Reduction in Area: [2:100.00%] [3:N/A] [N/A:N/A] % Reduction in Volume: 100.00% [3:N/A] [N/A:N/A] Classification: [2:Full Thickness Without Exposed Support Structures] [3:Partial Thickness] [N/A:N/A] Exudate Amount: [2:N/A] [3:Large] [N/A:N/A] Exudate Type: [2:N/A] [3:Serous] [N/A:N/A] Exudate Color: [2:N/A] [3:amber] [N/A:N/A] Wound Margin: [2:N/A] [3:Flat and Intact] [N/A:N/A] Granulation Amount: [2:N/A] [3:Small (1-33%)] [N/A:N/A] Granulation Quality: N/A Pink N/A Necrotic Amount: N/A None Present (0%) N/A Epithelialization: N/A Large (67-100%) N/A Periwound Skin Texture: No Abnormalities  Noted Excoriation: No N/A Induration: No Callus: No Crepitus: No Rash: No Scarring: No Periwound Skin No Abnormalities Noted Maceration: No N/A Moisture: Dry/Scaly: No Periwound Skin Color: No Abnormalities Noted Erythema: Yes N/A Atrophie Blanche: No Cyanosis: No Ecchymosis: No Hemosiderin Staining: No Mottled: No Pallor: No Rubor: No Erythema Location: N/A Circumferential N/A Temperature: N/A Hot N/A Tenderness on No Yes N/A Palpation: Wound Preparation: N/A Ulcer Cleansing: N/A Rinsed/Irrigated with Saline Topical Anesthetic Applied: None Treatment Notes Wound #3 (Right Hand - 5th Digit) 1. Cleansed with: Clean wound with Normal Saline 4. Dressing Applied: Aquacel Ag Other dressing (specify in notes) Notes coverlet Electronic Signature(s) Signed: 03/31/2016 4:37:19 PM By: [name] MD Entered By: [name] on 03/31/2016 10:21:16 Gosselin, [name] ([id number]) -------------------------------------------------------------------------------- [facility] Details Patient Name: Phillis, Barbie A. Date of Service: 03/31/2016 10:15 AM Medical Record Patient Account Number: [id number] Number: Treating RN: Montey Hora [birth year] (75 y.o. Other Clinician: Date of Birth/Sex: Female)  Treating ROBSON, MICHAEL Primary Care Lee Kuang: Lavera [name] Keonta Alsip/Extender: G Referring Keath Matera: [name] in Treatment: 14 Active Inactive ` Abuse / Safety / Falls / Self Care Management Nursing Diagnoses: Impaired physical mobility Potential for falls Goals: Patient will remain injury free Date Initiated: 12/24/2015 Target Resolution Date: 02/10/2016 Goal Status: Active Interventions: Assess fall risk on admission and as needed Notes: ` Orientation to the Wound Care Program Nursing Diagnoses: Knowledge deficit related to the wound healing center program Goals: Patient/caregiver will verbalize understanding of the [place] Date Initiated: 12/24/2015 Target Resolution Date: 02/10/2016 Goal Status: Active Interventions: Provide education on orientation to the wound center Notes: ` Wound/Skin Impairment Lopes, Pyper A. ([id number]) Nursing Diagnoses: Impaired tissue integrity Goals: Patient/caregiver will verbalize understanding of skin care regimen Date Initiated: 12/24/2015 Target Resolution Date: 02/10/2016 Goal Status: Active Ulcer/skin breakdown will have a volume reduction of 30% by week 4 Date Initiated: 12/24/2015 Target Resolution Date: 02/10/2016 Goal Status: Active Ulcer/skin breakdown will have a volume reduction of 50% by week 8 Date Initiated: 12/24/2015 Target Resolution Date: 02/24/2016 Goal Status: Active Ulcer/skin breakdown will have a volume reduction of 80% by week 12 Date Initiated: 12/24/2015 Target Resolution Date: 03/23/2016 Goal Status: Active Ulcer/skin breakdown will heal within 14 weeks Date Initiated: 12/24/2015 Target Resolution Date: 04/06/2016 Goal Status: Active Interventions: Assess patient/caregiver ability to obtain necessary supplies Assess patient/caregiver ability to perform ulcer/skin care regimen upon admission and as needed Assess ulceration(s) every visit Notes: Electronic  Signature(s) Signed: 03/31/2016 4:58:21 PM By: Montey Hora Entered By: Montey Hora on 03/31/2016 10:04:33 Macmurray, Lamonda A. ([id number]) -------------------------------------------------------------------------------- Pain Assessment Details Patient Name: Musolino, Breyonna A. Date of Service: 03/31/2016 10:15 AM Medical Record Patient Account Number: [id number] Number: Treating RN: Montey Hora 15-Sep-1940 (75 y.o. Other Clinician: Date of Birth/Sex: Female) Treating ROBSON, MICHAEL Primary Care May Ozment: Lavera [name] Lexys Milliner/Extender: G Referring Neyland Pettengill: [name] in Treatment: 14 Active Problems Location of Pain Severity and Description of Pain Patient Has Paino No Site Locations Pain Management and Medication Current Pain Management: Notes Topical or injectable lidocaine is offered to patient for acute pain when surgical debridement is performed. If needed, Patient is instructed to use over the counter pain medication for the following 24-48 hours after debridement. Wound care MDs do not prescribed pain medications. Patient has chronic pain or uncontrolled pain. Patient has been instructed to make an appointment with their Primary Care Physician for pain management. Electronic Signature(s) Signed: 03/31/2016 4:58:21 PM By: Montey Hora Entered By: Montey Hora on 03/31/2016 09:43:27  Moulton, Tauriel A. ([id number]) -------------------------------------------------------------------------------- Patient/Caregiver Education Details Patient Name: Golomb, Brookelynne A. Date of Service: 03/31/2016 10:15 AM Medical Record Patient Account Number: [id number] Number: Treating RN: Montey Hora Feb 18, 1940 (75 y.o. Other Clinician: Date of Birth/Gender: Female) Treating ROBSON, MICHAEL Primary Care Physician: Lavera [name] Physician/Extender: G Referring Physician: [name] in Treatment: 14 Education Assessment Education Provided  To: Patient Education Topics Provided Wound/Skin Impairment: Handouts: Other: wound care as ordered Methods: Demonstration, Explain/Verbal Responses: State content correctly Electronic Signature(s) Signed: 03/31/2016 4:58:21 PM By: Montey Hora Entered By: Montey Hora on 03/31/2016 10:18:21 Ivie, Rahi A. ([id number]) -------------------------------------------------------------------------------- Wound Assessment Details Patient Name: Murph, Akhila A. Date of Service: 03/31/2016 10:15 AM Medical Record Patient Account Number: [id number] Number: Treating RN: Montey Hora [birth year] (75 y.o. Other Clinician: Date of Birth/Sex: Female) Treating ROBSON, MICHAEL Primary Care Devery Odwyer: Lavera [name] Darianna Amy/Extender: G Referring Ebert Forrester: [name] in Treatment: 14 Wound Status Wound Number: 2 Primary Etiology: Open Surgical Wound Wound Location: Distal, Midline Chest Wound Status: Open Wounding Event: Surgical Injury Date Acquired: 11/11/2015 Weeks Of Treatment: 13 Clustered Wound: No Photos Photo Uploaded By: Montey Hora on 03/31/2016 16:53:35 Wound Measurements Length: (cm) 0 % Reducti Width: (cm) 0 % Reducti Depth: (cm) 0 Area: (cm) 0 Volume: (cm) 0 on in Area: 100% on in Volume: 100% Wound Description Full Thickness Without Exposed Classification: Support Structures Periwound Skin Texture Texture Color No Abnormalities Noted: No No Abnormalities Noted: No Moisture No Abnormalities Noted: No Schroth, Reni A. ([id number]) Electronic Signature(s) Signed: 03/31/2016 4:58:21 PM By: Montey Hora Entered By: Montey Hora on 03/31/2016 09:50:30 Heasley, Shanya A. ([id number]) -------------------------------------------------------------------------------- Wound Assessment Details Patient Name: Quach, Simonne A. Date of Service: 03/31/2016 10:15 AM Medical Record Patient Account Number: [id number] Number: Treating RN: Montey Hora 1940-07-29 (75 y.o. Other Clinician: Date of Birth/Sex: Female) Treating ROBSON, MICHAEL Primary Care Shammond Arave: Lavera [name] Crestina Strike/Extender: G Referring Esli Clements: [name] in Treatment: 14 Wound Status Wound Number: 3 Primary Cellulitis Etiology: Wound Location: Right Hand - 5th Digit Wound Open Wounding Event: Blister Status: Date Acquired: 03/24/2016 Comorbid Cataracts, Anemia, Congestive Heart Weeks Of Treatment: 0 History: Failure, Coronary Artery Disease, Clustered Wound: No Hypertension, Type II Diabetes, Osteoarthritis, Neuropathy, Received Chemotherapy Photos Photo Uploaded By: Montey Hora on 03/31/2016 16:53:35 Wound Measurements Length: (cm) 3 Width: (cm) 2.5 Depth: (cm) 0.1 Area: (cm) 5.89 Volume: (cm) 0.589 % Reduction in Area: % Reduction in Volume: Epithelialization: Large (67-100%) Tunneling: No Undermining: No Wound Description Classification: Partial Thickness Foul Odor After Wound Margin: Flat and Intact Slough/Fibrino Exudate Amount: Large Exudate Type: Serous Exudate Color: amber Cleansing: No No Wound Bed Canche, Ruqayyah A. ([id number]) Granulation Amount: Small (1-33%) Exposed Structure Granulation Quality: Pink Fascia Exposed: No Necrotic Amount: None Present (0%) Fat Layer (Subcutaneous Tissue) Exposed: No Tendon Exposed: No Muscle Exposed: No Joint Exposed: No Bone Exposed: No Limited to Skin Breakdown Periwound Skin Texture Texture Color No Abnormalities Noted: No No Abnormalities Noted: No Callus: No Atrophie Blanche: No Crepitus: No Cyanosis: No Excoriation: No Ecchymosis: No Induration: No Erythema: Yes Rash: No Erythema Location: Circumferential Scarring: No Hemosiderin Staining: No Mottled: No Moisture Pallor: No No Abnormalities Noted: No Rubor: No Dry / Scaly: No Maceration: No Temperature / Pain Temperature: Hot Tenderness on Palpation: Yes Wound Preparation Ulcer Cleansing:  Rinsed/Irrigated with Saline Topical Anesthetic Applied: None Treatment Notes Wound #3 (Right Hand - 5th Digit) 1. Cleansed with: Clean wound with Normal Saline 4. Dressing Applied: Aquacel Ag Other dressing (specify in notes) Notes  coverlet Electronic Signature(s) Signed: 03/31/2016 4:58:21 PM By: Montey Hora Entered By: Montey Hora on 03/31/2016 10:04:26 Diel, [place] AMarland Kitchen ([id number]) -------------------------------------------------------------------------------- Vitals Details Patient Name: Nethery, Tish A. Date of Service: 03/31/2016 10:15 AM Medical Record Patient Account Number: [id number] Number: Treating RN: Montey Hora February 09, 1940 (75 y.o. Other Clinician: Date of Birth/Sex: Female) Treating ROBSON, MICHAEL Primary Care Mace Weinberg: Lavera [name] Shereda Graw/Extender: G Referring Vieva Brummitt: [name] in Treatment: 14 Vital Signs Time Taken: 09:43 Temperature (F): 97.8 Height (in): 62 Pulse (bpm): 70 Weight (lbs): 161 Respiratory Rate (breaths/min): 18 Body Mass Index (BMI): 29.4 Blood Pressure (mmHg): 109/54 Reference Range: 80 - 120 mg / dl Electronic Signature(s) Signed: 03/31/2016 4:58:21 PM By: Montey Hora Entered By: Montey Hora on 03/31/2016 09:47:37

## 2016-04-02 ENCOUNTER — Ambulatory Visit
Admission: RE | Admit: 2016-04-02 | Discharge: 2016-04-02 | Disposition: A | Discharge status: Home / Self Care | Payer: Medicare Other | Source: Ambulatory Visit | Attending: Urology | Admitting: Urology

## 2016-04-02 ENCOUNTER — Telehealth: Payer: Self-pay

## 2016-04-02 DIAGNOSIS — N201 Calculus of ureter: Secondary | ICD-10-CM | POA: Insufficient documentation

## 2016-04-02 DIAGNOSIS — N200 Calculus of kidney: Secondary | ICD-10-CM

## 2016-04-02 DIAGNOSIS — I70208 Unspecified atherosclerosis of native arteries of extremities, other extremity: Secondary | ICD-10-CM | POA: Insufficient documentation

## 2016-04-02 NOTE — Telephone Encounter (Signed)
The pt was advised that she can have the EGD from the procedure stand point but if she is not feeling well she can reschedule.  She states she will call back on Monday and confirm if she is going to keep the appt

## 2016-04-02 NOTE — Telephone Encounter (Signed)
Festus Aloe, MD  Lestine Box, LPN        Notify patient we couldn't see the stone in the ureter on the KUB. Have her get a RENAL US a day or two before she follows up. THanks.    LMOM

## 2016-04-03 LAB — AEROBIC CULTURE W GRAM STAIN (SUPERFICIAL SPECIMEN): Culture: NO GROWTH

## 2016-04-04 NOTE — Progress Notes (Signed)
04/05/2016 9:11 AM   [name] 1940/08/18 [id number]  Referring provider: Lynnell Jude, MD [address] [place], [place] [address]  Chief Complaint  Patient presents with  . Nephrolithiasis    Follow up Results KUB    HPI: 76 yo WF who returns today to review the results of her KUB performed to evaluate a 3 mm left proximal ureteral stone.  Background history This 76 year old seen for ureteral stone.  She had left lower quadrant and left flank pain and plain xray was equivocal. On 03/28/2016 she underwent a CT scan which revealed a 3 mm left proximal ureteral stone. Minimal hydro, but bilateral extrarenal pelvis. Her white count was 10.2, BUN 29, creatinine 1.49 (up from 1.26).  UA showed 0-5 white cells, 6-30 red cells.    KUB taken on 04/02/2016 noted bowel gas pattern unremarkable.  Lap band at gastric cardia.  Probable calculi upper pole right kidney region. No ureteral calculi evident by radiography. Probable phleboliths in the pelvis. Iliac artery atherosclerosis noted.  I have independently reviewed the films.  Today, she is still having left lower quadrant pain associated with nausea, vomiting and diarrhea.  She is unable to eat anything.  She has not capture a stone in her strainer and states she has been straining every void.  She has not had fevers or chills.  She is not having gross hematuria, dysuria or flank pain.  Her UA today is unremarkable.     PMH: Past Medical History:  Diagnosis Date  . Anginal pain ([place])   . Anxiety   . Arthritis    "shoulders primarily" (01/13/2016)  . CHF (congestive heart failure) ([place])   . Chronic pain    from her multiple myeloma but says that her pain is usually in her legs/notes 01/13/2016  . High cholesterol   . History of blood transfusion    "numerous; related to procedures for my heart" (01/13/2016)  . Hypertension   . Ischemic cardiomyopathy    /nots 01/13/2016  . MI (myocardial infarction)    EKG on arrival 01/13/2016  showed NSR with evidence of old anterior and inferior infarcts  . MI (myocardial infarction) 08/2015  . Multiple myeloma ([place]) dx'd 2015  . PAD (peripheral artery disease) ([place])   . Stroke [facility])    "several in 1 year; ? year" (01/13/2016)  . Type II diabetes mellitus ([place])     Surgical History: Past Surgical History:  Procedure Laterality Date  . CARDIAC CATHETERIZATION N/A 01/14/2016   Procedure: Left Heart Cath and Cors/Grafts Angiography;  Surgeon: Burnell Blanks, MD;  Location: [facility];  Service: Cardiovascular;  Laterality: N/A;  . CARPAL TUNNEL RELEASE Left   . CORONARY ANGIOPLASTY WITH STENT PLACEMENT  05/2015   DES placed to the mid and distal LAD as well as OM2/notes 01/13/2016  . CORONARY ANGIOPLASTY WITH STENT PLACEMENT  ~ 2010  . CORONARY ARTERY BYPASS GRAFT  11/11/2015   CABG X2 LIMA-LAD and SVG-OM/notes 01/13/2016  . DILATION AND CURETTAGE OF UTERUS    . FEMORAL-POPLITEAL BYPASS GRAFT Left 2013   Archie Endo 01/13/2016  . LAPAROSCOPIC GASTRIC BANDING  2007  . RENAL ARTERY STENT    . VAGINAL HYSTERECTOMY      Home Medications:  Allergies as of 04/05/2016      Reactions   Other Swelling   Smoke--Head swelling   Statins Swelling   Amoxicillin    Codeine    Sulfa Antibiotics    Duloxetine Nausea Only  Medication List       Accurate as of 04/05/16  9:11 AM. Always use your most recent med list.          alfuzosin 10 MG 24 hr tablet Commonly known as:  UROXATRAL Take 1 tablet (10 mg total) by mouth daily with breakfast.   Alirocumab 75 MG/ML Sopn Commonly known as:  PRALUENT Inject 1 pen into the skin every 14 (fourteen) days.   aspirin 81 MG tablet Take 81 mg by mouth daily.   clopidogrel 75 MG tablet Commonly known as:  PLAVIX Take 75 mg by mouth daily.   diclofenac sodium 1 % Gel Commonly known as:  VOLTAREN Apply 2 g topically daily as needed for pain.   fentaNYL 50 MCG/HR Commonly known as:  DURAGESIC - dosed mcg/hr Place 50 mcg  onto the skin every 3 (three) days.   gabapentin 300 MG capsule Commonly known as:  NEURONTIN Take 300-600 mg by mouth See admin instructions. Pt takes 62m in am, 3015mat bedtime   hydrocortisone cream 1 % Apply topically as needed for itching.   HYDROmorphone 2 MG tablet Commonly known as:  DILAUDID Take 2 mg by mouth every 4 (four) hours as needed for severe pain.   isosorbide mononitrate 30 MG 24 hr tablet Commonly known as:  IMDUR Take 1 tablet (30 mg total) by mouth daily.   lisinopril 5 MG tablet Commonly known as:  PRINIVIL,ZESTRIL Take 1 tablet (5 mg total) by mouth daily.   metFORMIN 500 MG tablet Commonly known as:  GLUCOPHAGE Take 1,000 mg by mouth 2 (two) times daily with a meal.   metoprolol tartrate 25 MG tablet Commonly known as:  LOPRESSOR Take 0.5 tablets (12.5 mg total) by mouth 2 (two) times daily.   nitroGLYCERIN 0.4 MG SL tablet Commonly known as:  NITROSTAT Place 1 tablet (0.4 mg total) under the tongue every 5 (five) minutes as needed for chest pain.   ondansetron 4 MG disintegrating tablet Commonly known as:  ZOFRAN-ODT Take 4 mg by mouth every 8 (eight) hours as needed for nausea or vomiting.   senna-docusate 8.6-50 MG tablet Commonly known as:  Senokot-S Take 2 tablets by mouth 2 (two) times daily as needed for constipation.   Vitamin D3 50000 units Caps Take 1 capsule by mouth 2 (two) times a week.       Allergies:  Allergies  Allergen Reactions  . Other Swelling    Smoke--Head swelling  . Statins Swelling  . Amoxicillin   . Codeine   . Sulfa Antibiotics   . Duloxetine Nausea Only    Family History: Family History  Problem Relation Age of Onset  . Diabetes Mother   . Heart failure Mother   . Prostate cancer Neg Hx   . Kidney cancer Neg Hx   . Bladder Cancer Neg Hx     Social History:  reports that she has never smoked. She has never used smokeless tobacco. She reports that she drinks alcohol. She reports that she does  not use drugs.  ROS: UROLOGY Frequent Urination?: No Hard to postpone urination?: No Burning/pain with urination?: No Get up at night to urinate?: No Leakage of urine?: No Urine stream starts and stops?: No Trouble starting stream?: No Do you have to strain to urinate?: No Blood in urine?: No Urinary tract infection?: No Sexually transmitted disease?: No Injury to kidneys or bladder?: No Painful intercourse?: No Weak stream?: No Currently pregnant?: No Vaginal bleeding?: No Last menstrual period?: n  Gastrointestinal Nausea?: Yes Vomiting?: Yes Indigestion/heartburn?: No Diarrhea?: Yes Constipation?: No  Constitutional Fever: No Night sweats?: No Weight loss?: No Fatigue?: No  Skin Skin rash/lesions?: No Itching?: No  Eyes Blurred vision?: No Double vision?: No  Ears/Nose/Throat Sore throat?: No Sinus problems?: Yes  Hematologic/Lymphatic Swollen glands?: No Easy bruising?: No  Cardiovascular Leg swelling?: No Chest pain?: No  Respiratory Cough?: No Shortness of breath?: No  Endocrine Excessive thirst?: No  Musculoskeletal Back pain?: No Joint pain?: No  Neurological Headaches?: No Dizziness?: No  Psychologic Depression?: No Anxiety?: No  Physical Exam: BP (!) 133/51   Pulse 76   Ht _0  (1.575 m)   Wt 156 lb 1.6 oz (70.8 kg)   BMI 28.55 kg/m   Constitutional:  Alert and oriented, No acute distress. HEENT: [place] AT, moist mucus membranes.  Trachea midline, no masses. Cardiovascular: No clubbing, cyanosis, or edema. Respiratory: Normal respiratory effort, no increased work of breathing. GI: Abdomen is soft, nontender, nondistended, no abdominal masses GU: No CVA tenderness.  Skin: No rashes, bruises or suspicious lesions. Lymph: No cervical or inguinal adenopathy. Neurologic: Grossly intact, no focal deficits, moving all 4 extremities. Psychiatric: Normal mood and affect.  Laboratory Data: Lab Results  Component Value Date    WBC 10.2 03/27/2016   HGB 12.2 03/27/2016   HCT 36.4 03/27/2016   MCV 78.7 (L) 03/27/2016   PLT 212 03/27/2016    Lab Results  Component Value Date   CREATININE 1.49 (H) 03/27/2016     Lab Results  Component Value Date   HGBA1C 7.6 (H) 01/14/2016    Pertinent Imaging: CLINICAL DATA:  Nausea.  Recent ureteral calculus on the left  EXAM: ABDOMEN - 1 VIEW  COMPARISON:  CT abdomen and pelvis March 28, 2016  FINDINGS: There is a calcification to the right of L1 measuring 7 x 6 mm, a likely calculus. There is a 1 mm nearby calcification. There are probable phleboliths in the pelvis. No left ureteral calculus is seen by radiography.  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  Lap band noted at gastric cardia.  Moderate stool is noted in the colon. There is no bowel dilatation or air-fluid levels suggesting bowel obstruction. No free air. Surgical clips are noted in the left inguinal region. Atherosclerotic calcifications noted in each common iliac artery.  IMPRESSION: Bowel gas pattern unremarkable.  Lap band at gastric cardia.  Probable calculi upper pole right kidney region. No ureteral calculi evident by radiography. Probable phleboliths in the pelvis.  Iliac artery atherosclerosis noted.   Electronically Signed   By: Lowella Grip III M.D.   On: 04/02/2016 09:53   Assessment & Plan:    1. Left ureteral stone  - no stone seen on KUB  - UA is unremarkable  2. Left hydronephrosis  - obtain RUS to ensure the hydronephrosis has resolved.    3. Right lower quadrant pain  - UA is unremarkable  - not likely urological in etiology  - encouraged to keep upcoming studies with GI   Return for RUS report.  Zara Council, [facility] [address] [place], [place] [address] [phone]

## 2016-04-05 ENCOUNTER — Encounter: Payer: Self-pay | Admitting: Urology

## 2016-04-05 ENCOUNTER — Ambulatory Visit (INDEPENDENT_AMBULATORY_CARE_PROVIDER_SITE_OTHER): Payer: Medicare Other | Admitting: Urology

## 2016-04-05 VITALS — BP 133/51 | HR 76 | Ht 62.0 in | Wt 156.1 lb

## 2016-04-05 DIAGNOSIS — N201 Calculus of ureter: Secondary | ICD-10-CM | POA: Diagnosis not present

## 2016-04-05 DIAGNOSIS — N132 Hydronephrosis with renal and ureteral calculous obstruction: Secondary | ICD-10-CM | POA: Diagnosis not present

## 2016-04-05 DIAGNOSIS — R1031 Right lower quadrant pain: Secondary | ICD-10-CM

## 2016-04-05 DIAGNOSIS — I2510 Atherosclerotic heart disease of native coronary artery without angina pectoris: Secondary | ICD-10-CM

## 2016-04-05 LAB — URINALYSIS, COMPLETE
Bilirubin, UA: NEGATIVE
GLUCOSE, UA: NEGATIVE
KETONES UA: NEGATIVE
LEUKOCYTES UA: NEGATIVE
Nitrite, UA: NEGATIVE
RBC, UA: NEGATIVE
SPEC GRAV UA: 1.025 (ref 1.005–1.030)
Urobilinogen, Ur: 0.2 mg/dL (ref 0.2–1.0)
pH, UA: 5 (ref 5.0–7.5)

## 2016-04-05 LAB — MICROSCOPIC EXAMINATION: RBC MICROSCOPIC, UA: NONE SEEN /HPF (ref 0–?)

## 2016-04-05 NOTE — Telephone Encounter (Signed)
Spoke with pt in reference to KUB results and RUS needed. Pt voiced understanding.

## 2016-04-06 ENCOUNTER — Ambulatory Visit (AMBULATORY_SURGERY_CENTER): Payer: Medicare Other | Admitting: Gastroenterology

## 2016-04-06 ENCOUNTER — Encounter: Payer: Self-pay | Admitting: Gastroenterology

## 2016-04-06 ENCOUNTER — Other Ambulatory Visit (INDEPENDENT_AMBULATORY_CARE_PROVIDER_SITE_OTHER): Payer: Medicare Other

## 2016-04-06 ENCOUNTER — Ambulatory Visit

## 2016-04-06 ENCOUNTER — Other Ambulatory Visit: Payer: Self-pay

## 2016-04-06 VITALS — BP 150/60 | HR 76 | Temp 96.8°F | Resp 12 | Ht 62.0 in | Wt 155.0 lb

## 2016-04-06 DIAGNOSIS — K922 Gastrointestinal hemorrhage, unspecified: Secondary | ICD-10-CM | POA: Diagnosis not present

## 2016-04-06 DIAGNOSIS — R1013 Epigastric pain: Secondary | ICD-10-CM

## 2016-04-06 DIAGNOSIS — K2970 Gastritis, unspecified, without bleeding: Secondary | ICD-10-CM | POA: Diagnosis not present

## 2016-04-06 DIAGNOSIS — K319 Disease of stomach and duodenum, unspecified: Secondary | ICD-10-CM

## 2016-04-06 DIAGNOSIS — R112 Nausea with vomiting, unspecified: Secondary | ICD-10-CM | POA: Diagnosis not present

## 2016-04-06 LAB — CBC WITH DIFFERENTIAL/PLATELET
Basophils Absolute: 0.1 10*3/uL (ref 0.0–0.1)
Basophils Relative: 0.4 % (ref 0.0–3.0)
EOS PCT: 0.4 % (ref 0.0–5.0)
Eosinophils Absolute: 0.1 10*3/uL (ref 0.0–0.7)
HCT: 33.4 % — ABNORMAL LOW (ref 36.0–46.0)
Hemoglobin: 10.9 g/dL — ABNORMAL LOW (ref 12.0–15.0)
LYMPHS ABS: 6.7 10*3/uL — AB (ref 0.7–4.0)
Lymphocytes Relative: 47.7 % — ABNORMAL HIGH (ref 12.0–46.0)
MCHC: 32.5 g/dL (ref 30.0–36.0)
MCV: 79.4 fl (ref 78.0–100.0)
MONOS PCT: 3.2 % (ref 3.0–12.0)
Monocytes Absolute: 0.5 10*3/uL (ref 0.1–1.0)
NEUTROS ABS: 6.8 10*3/uL (ref 1.4–7.7)
NEUTROS PCT: 48.3 % (ref 43.0–77.0)
PLATELETS: 272 10*3/uL (ref 150.0–400.0)
RBC: 4.2 Mil/uL (ref 3.87–5.11)
RDW: 16.1 % — ABNORMAL HIGH (ref 11.5–15.5)
WBC: 14.1 10*3/uL — ABNORMAL HIGH (ref 4.0–10.5)

## 2016-04-06 LAB — BASIC METABOLIC PANEL
BUN: 36 mg/dL — ABNORMAL HIGH (ref 6–23)
CALCIUM: 9.8 mg/dL (ref 8.4–10.5)
CO2: 24 meq/L (ref 19–32)
Chloride: 100 mEq/L (ref 96–112)
Creatinine, Ser: 1.33 mg/dL — ABNORMAL HIGH (ref 0.40–1.20)
GFR: 41.36 mL/min — ABNORMAL LOW (ref >60.00)
Glucose, Bld: 138 mg/dL — ABNORMAL HIGH (ref 70–99)
Potassium: 4.3 mEq/L (ref 3.5–5.1)
Sodium: 136 mEq/L (ref 135–145)

## 2016-04-06 MED ORDER — PROMETHAZINE HCL 12.5 MG PO TABS: ORAL_TABLET | ORAL | 3 refills | Status: DC

## 2016-04-06 MED ORDER — SODIUM CHLORIDE 0.9 % IV SOLN: 500.0000 mL | INTRAVENOUS | Status: DC

## 2016-04-06 NOTE — Progress Notes (Signed)
Given IV Phenergan 12.5 mg  For nausea. No further complaints.

## 2016-04-06 NOTE — Patient Instructions (Signed)
YOU HAD AN ENDOSCOPIC PROCEDURE TODAY AT [facility]:   Refer to the procedure report that was given to you for any specific questions about what was found during the examination.  If the procedure report does not answer your questions, please call your gastroenterologist to clarify.  If you requested that your care partner not be given the details of your procedure findings, then the procedure report has been included in a sealed envelope for you to review at your convenience later.  YOU SHOULD EXPECT: Some feelings of bloating in the abdomen. Passage of more gas than usual.  Walking can help get rid of the air that was put into your GI tract during the procedure and reduce the bloating. If you had a lower endoscopy (such as a colonoscopy or flexible sigmoidoscopy) you may notice spotting of blood in your stool or on the toilet paper. If you underwent a bowel prep for your procedure, you may not have a normal bowel movement for a few days.  Please Note:  You might notice some irritation and congestion in your nose or some drainage.  This is from the oxygen used during your procedure.  There is no need for concern and it should clear up in a day or so.  SYMPTOMS TO REPORT IMMEDIATELY:   Following lower endoscopy (colonoscopy or flexible sigmoidoscopy):  Excessive amounts of blood in the stool  Significant tenderness or worsening of abdominal pains  Swelling of the abdomen that is new, acute  Fever of 100F or higher   Following upper endoscopy (EGD)  Vomiting of blood or coffee ground material  New chest pain or pain under the shoulder blades  Painful or persistently difficult swallowing  New shortness of breath  Fever of 100F or higher  Black, tYOU HAD AN ENDOSCOPIC PROCEDURE TODAY AT [facility]:   Refer to the procedure report that was given to you for any specific questions about what was found during the examination.  If the procedure report does not  answer your questions, please call your gastroenterologist to clarify.  If you requested that your care partner not be given the details of your procedure findings, then the procedure report has been included in a sealed envelope for you to review at your convenience later.  YOU SHOULD EXPECT: Some feelings of bloating in the abdomen. Passage of more gas than usual.  Walking can help get rid of the air that was put into your GI tract during the procedure and reduce the bloating. If you had a lower endoscopy (such as a colonoscopy or flexible sigmoidoscopy) you may notice spotting of blood in your stool or on the toilet paper. If you underwent a bowel prep for your procedure, you may not have a normal bowel movement for a few days.  Please Note:  You might notice some irritation and congestion in your nose or some drainage.  This is from the oxygen used during your procedure.  There is no need for concern and it should clear up in a day or so.  SYMPTOMS TO REPORT IMMEDIATELY:     Following upper endoscopy (EGD)  Vomiting of blood or coffee ground material  New chest pain or pain under the shoulder blades  Painful or persistently difficult swallowing  New shortness of breath  Fever of 100F or higher  Black, tarry-looking stools  For urgent or emergent issues, a gastroenterologist can be reached at any hour by calling [phone].   DIET:  We  do recommend a small meal at first, but then you may proceed to your regular diet.  Drink plenty of fluids but you should avoid alcoholic beverages for 24 hours.  ACTIVITY:  You should plan to take it easy for the rest of today and you should NOT DRIVE or use heavy machinery until tomorrow (because of the sedation medicines used during the test).    FOLLOW UP: Our staff will call the number listed on your records the next business day following your procedure to check on you and address any questions or concerns that you may have regarding the  information given to you following your procedure. If we do not reach you, we will leave a message.  However, if you are feeling well and you are not experiencing any problems, there is no need to return our call.  We will assume that you have returned to your regular daily activities without incident.  If any biopsies were taken you will be contacted by phone or by letter within the next 1-3 weeks.  Please call us at [phone] if you have not heard about the biopsies in 3 weeks.  Await for biopsy results Resume all medications    SIGNATURES/CONFIDENTIALITY: You and/or your care partner have signed paperwork which will be entered into your electronic medical record.  These signatures attest to the fact that that the information above on your After Visit Summary has been reviewed and is understood.  Full responsibility of the confidentiality of this discharge information lies with you and/or your care-partner.  For urgent or emergent issues, a gastroenterologist can be reached at any hour by calling [phone].   DIET:  We do recommend a small meal at first, but then you may proceed to your regular diet.  Drink plenty of fluids but you should avoid alcoholic beverages for 24 hours.  ACTIVITY:  You should plan to take it easy for the rest of today and you should NOT DRIVE or use heavy machinery until tomorrow (because of the sedation medicines used during the test).    FOLLOW UP: Our staff will call the number listed on your records the next business day following your procedure to check on you and address any questions or concerns that you may have regarding the information given to you following your procedure. If we do not reach you, we will leave a message.  However, if you are feeling well and you are not experiencing any problems, there is no need to return our call.  We will assume that you have returned to your regular daily activities without incident.  If any biopsies were taken  you will be contacted by phone or by letter within the next 1-3 weeks.  Please call us at [phone] if you have not heard about the biopsies in 3 weeks.    SIGNATURES/CONFIDENTIALITY: You and/or your care partner have signed paperwork which will be entered into your electronic medical record.  These signatures attest to the fact that that the information above on your After Visit Summary has been reviewed and is understood.  Full responsibility of the confidentiality of this discharge information lies with you and/or your care-partner.

## 2016-04-06 NOTE — Progress Notes (Signed)
Called to room to assist during endoscopic procedure.  Patient ID and intended procedure confirmed with present staff. Received instructions for my participation in the procedure from the performing physician.  

## 2016-04-06 NOTE — Progress Notes (Signed)
Report to PACU, RN, vss, BBS= Clear.  

## 2016-04-06 NOTE — Op Note (Addendum)
[facility] Patient Name: [name] Procedure Date: 04/06/2016 10:03 AM MRN: [id number] Endoscopist: Milus Banister , MD Age: 76 Referring MD:  Date of Birth: 1940-12-15 Gender: Female Account #: [id number] Procedure:                Upper GI endoscopy Indications:              Epigastric abdominal pain, Nausea with vomiting,                            Weight loss Medicines:                Monitored Anesthesia Care Procedure:                Pre-Anesthesia Assessment:                           - Prior to the procedure, a History and Physical                            was performed, and patient medications and                            allergies were reviewed. The patient's tolerance of                            previous anesthesia was also reviewed. The risks                            and benefits of the procedure and the sedation                            options and risks were discussed with the patient.                            All questions were answered, and informed consent                            was obtained. Prior Anticoagulants: The patient has                            taken Plavix (clopidogrel), last dose was day of                            procedure. ASA Grade Assessment: III - A patient                            with severe systemic disease. After reviewing the                            risks and benefits, the patient was deemed in                            satisfactory condition to undergo the procedure.  After obtaining informed consent, the endoscope was                            passed under direct vision. Throughout the                            procedure, the patient's blood pressure, pulse, and                            oxygen saturations were monitored continuously. The                            Endoscope was introduced through the mouth, and                            advanced to the second part of duodenum.  The upper                            GI endoscopy was accomplished without difficulty.                            The patient tolerated the procedure well. Scope In: Scope Out: Findings:                 The esophagus was normal.                           Mild inflammation characterized by erythema and                            friability was found in the gastric antrum.                            Biopsies were taken with a cold forceps for                            histology.                           There was a small amount of hematin in the stomach                            at the outset of the procedure. There was normal                            appearing deformity from previous lap band in                            proximal stomach. Along this deformity there was a                            slightly irregular mucosa (erosion?) that was                            oozing  blood actively. This bleeding started during                            the examination without evident trauma to the area.                            Endoscopic clip placement was performed with                            cessation of bleeding.                           A 20 mm non-bleeding diverticulum was found in the                            area of the papilla.                           The exam was otherwise without abnormality. Complications:            No immediate complications. Estimated blood loss:                            None. Estimated Blood Loss:     Estimated blood loss: none. Impression:               - Normal esophagus.                           - Mild gastritis. Biopsied. Clip was placed.                           - Non-bleeding duodenal diverticulum; there was no                            evidence of diverticulitis.                           - Previous lap band site was typical appearing but                            there was oozing of blood from what appeared to be                             a small erosion; this was treated with placement of                            endoscopic clip successfully. I suspect this oozing                            is related to trauma at the site of lap band from                            her recent vomiting, retching in setting of plavix.                           -  The examination was otherwise normal.                           - I am not convinced these findings explain her                            pain, vomiting, weight loss. She had recent CT                            proven ureteral stone with left sided                            hydronephrosis. I am not sure that has resolved                            even though KUB yesterday showed no stone                            especially since KUB the day prior to the CT last                            week also showed no ureteral stone. Urology                            recently ordered RUS. I am ordering CBC, BMET                            today. Will consider barium esophagram evaluation                            to evaluate the lap band site pending the above. Recommendation:           - Patient has a contact number available for                            emergencies. The signs and symptoms of potential                            delayed complications were discussed with the                            patient. Return to normal activities tomorrow.                            Written discharge instructions were provided to the                            patient.                           - Resume previous diet.                           - Continue present medications.                           -  Await pathology results. Milus Banister, MD 04/06/2016 10:28:02 AM This report has been signed electronically.

## 2016-04-06 NOTE — Progress Notes (Signed)
Pt's states no medical or surgical changes since previsit or office visit. 

## 2016-04-07 ENCOUNTER — Ambulatory Visit: Payer: Medicare Other | Admitting: Internal Medicine

## 2016-04-07 ENCOUNTER — Telehealth: Payer: Self-pay

## 2016-04-07 DIAGNOSIS — K922 Gastrointestinal hemorrhage, unspecified: Secondary | ICD-10-CM

## 2016-04-07 NOTE — Telephone Encounter (Signed)
[place] is on 04/13/16  You have been scheduled for a Barium Esophogram at [facility] on [address] on 04/12/16 at 1130 pm. Please arrive 15 minutes prior to your appointment for registration. Make certain not to have anything to eat or drink 3 hours prior to your test. If you need to reschedule for any reason, please contact radiology at [phone] to do so. __________________________________________________________________________ A barium swallow is an examination that concentrates on views of the esophagus. This tends to be a double contrast exam (barium and two liquids which, when combined, create a gas to distend the wall of the oesophagus) or single contrast (non-ionic iodine based). The study is usually tailored to your symptoms so a good history is essential. Attention is paid during the study to the form, structure and configuration of the esophagus, looking for functional disorders (such as aspiration, dysphagia, achalasia, motility and reflux) EXAMINATION You may be asked to change into a gown, depending on the type of swallow being performed. A radiologist and radiographer will perform the procedure. The radiologist will advise you of the type of contrast selected for your procedure and direct you during the exam. You will be asked to stand, sit or lie in several different positions and to hold a small amount of fluid in your mouth before being asked to swallow while the imaging is performed .In some instances you may be asked to swallow barium coated marshmallows to assess the motility of a solid food bolus. The exam can be recorded as a digital or video fluoroscopy procedure. POST PROCEDURE It will take 1-2 days for the barium to pass through your system. To facilitate this, it is important, unless otherwise directed, to increase your fluids for the next 24-48hrs and to resume your normal diet.  This test typically takes about 30 minutes to  perform. ____________________________________________________________________________  Pt has been given all information regarding appointments.  She verbalized understanding and will call that office to reschedule if needed.

## 2016-04-07 NOTE — Telephone Encounter (Signed)
  Follow up Call-  Call back number 04/06/2016  Post procedure Call Back phone  # [phone]  Permission to leave phone message Yes  Some recent data might be hidden    Patient was called for follow up after her procedure on 04/06/16. Patient reports that she has continued to experience nausea without vomiting. [name] does report that she was able to eat a little bit of soup and continues to sip on fluids.  Patient states that the medication has not helped at all. Please advise. Thanks.  Riki Sheer, LPN

## 2016-04-07 NOTE — Telephone Encounter (Signed)
Her symptoms are out of proportion to her EGD findings.  Awaiting path results and renal ultrasound (which was ordered by urology).  Can you check on when that [place] is going to be done (with [facility]) and ask that it be expedited.  Also, lets get barium esophagram as well.  Encourage her to drink fluids as much as possible.

## 2016-04-08 ENCOUNTER — Ambulatory Visit

## 2016-04-12 ENCOUNTER — Ambulatory Visit (HOSPITAL_COMMUNITY): Payer: Medicare Other

## 2016-04-12 ENCOUNTER — Ambulatory Visit
Admission: RE | Admit: 2016-04-12 | Discharge: 2016-04-12 | Disposition: A | Discharge status: Home / Self Care | Payer: Medicare Other | Source: Ambulatory Visit | Attending: Gastroenterology | Admitting: Gastroenterology

## 2016-04-12 DIAGNOSIS — K922 Gastrointestinal hemorrhage, unspecified: Secondary | ICD-10-CM

## 2016-04-13 ENCOUNTER — Ambulatory Visit

## 2016-04-13 ENCOUNTER — Ambulatory Visit
Admission: RE | Admit: 2016-04-13 | Discharge: 2016-04-13 | Disposition: A | Discharge status: Home / Self Care | Payer: Medicare Other | Source: Ambulatory Visit | Attending: Urology | Admitting: Urology

## 2016-04-13 DIAGNOSIS — N201 Calculus of ureter: Secondary | ICD-10-CM

## 2016-04-13 DIAGNOSIS — N200 Calculus of kidney: Secondary | ICD-10-CM | POA: Diagnosis not present

## 2016-04-13 DIAGNOSIS — N132 Hydronephrosis with renal and ureteral calculous obstruction: Secondary | ICD-10-CM

## 2016-04-14 ENCOUNTER — Telehealth: Payer: Self-pay

## 2016-04-14 ENCOUNTER — Telehealth: Payer: Self-pay | Admitting: Gastroenterology

## 2016-04-14 NOTE — Telephone Encounter (Signed)
Spoke with pt in reference to RUS results. Pt stated "I have a copy of the results. The stone is still present. Larene Beach needs to look at the results again."

## 2016-04-14 NOTE — Telephone Encounter (Signed)
Spoke with patient and clarified the results.  We were looking for absent of hydronephrosis to ensure the 3 mm stone had passed.  It did.  We will discuss whether or not to address the other stone at her appointment on the 10th.  It can be treated in an elective manner.

## 2016-04-14 NOTE — Telephone Encounter (Signed)
-----   Message from Nori Riis, PA-C sent at 04/13/2016  3:59 PM EDT ----- Please notify the patient that the RUS is normal.

## 2016-04-15 ENCOUNTER — Other Ambulatory Visit: Payer: Self-pay

## 2016-04-15 DIAGNOSIS — D649 Anemia, unspecified: Secondary | ICD-10-CM

## 2016-04-15 NOTE — Telephone Encounter (Signed)
Pt states she has noticed a change in her stool color from dark to light beige and she is still having a lot of gas and nausea. Pt has phenergan and zofran, states the zofran works better for her. Discussed with her that during EGD there was bleeding from her lap band and that could have caused her darker stool color but pt states it [place] usually always dark. Let pt know Dr. Ardis Hughs would be notified. Referral has been sent to [facility].

## 2016-04-19 NOTE — Progress Notes (Signed)
04/20/2016 4:29 PM   [name] 1940-06-12 [id number]  Referring provider: Lynnell Jude, MD [address] [place], [place] [address]  Chief Complaint  Patient presents with  . Results    Renal US    HPI: 76 yo WF who returns today to review the results of her RUS performed to evaluate the resolution of her hydronephrosis after passage of a 3 mm left ureteral stone.  Background history This 76 year old seen for ureteral stone.  She had left lower quadrant and left flank pain and plain xray was equivocal. On 03/28/2016 she underwent a CT scan which revealed a 3 mm left proximal ureteral stone. Minimal hydro, but bilateral extrarenal pelvis. Her white count was 10.2, BUN 29, creatinine 1.49 (up from 1.26).  UA showed 0-5 white cells, 6-30 red cells.  KUB taken on 04/02/2016 noted bowel gas pattern unremarkable.  Lap band at gastric cardia.  Probable calculi upper pole right kidney region. No ureteral calculi evident by radiography. Probable phleboliths in the pelvis. Iliac artery atherosclerosis noted.    RUS performed on 04/13/2016 noted Interval resolution of mild hydronephrosis on the left. The UPJ stone is not visible. No other stones on the left are observed.  There are several tiny stones in the right kidney with the largest measuring 9 mm lying in the lower pole. There is no right-sided hydronephrosis.  I have independently reviewed the films.     PMH: Past Medical History:  Diagnosis Date  . Anginal pain ([place])   . Anxiety   . Arthritis    "shoulders primarily" (01/13/2016)  . CHF (congestive heart failure) ([place])   . Chronic pain    from her multiple myeloma but says that her pain is usually in her legs/notes 01/13/2016  . High cholesterol   . History of blood transfusion    "numerous; related to procedures for my heart" (01/13/2016)  . Hypertension   . Ischemic cardiomyopathy    /nots 01/13/2016  . MI (myocardial infarction)    EKG on arrival 01/13/2016 showed NSR with  evidence of old anterior and inferior infarcts  . MI (myocardial infarction) 08/2015  . Multiple myeloma ([place]) dx'd 2015  . PAD (peripheral artery disease) ([place])   . Stroke [facility])    "several in 1 year; ? year" (01/13/2016)  . Type II diabetes mellitus ([place])     Surgical History: Past Surgical History:  Procedure Laterality Date  . CARDIAC CATHETERIZATION N/A 01/14/2016   Procedure: Left Heart Cath and Cors/Grafts Angiography;  Surgeon: Burnell Blanks, MD;  Location: [facility];  Service: Cardiovascular;  Laterality: N/A;  . CARPAL TUNNEL RELEASE Left   . CORONARY ANGIOPLASTY WITH STENT PLACEMENT  05/2015   DES placed to the mid and distal LAD as well as OM2/notes 01/13/2016  . CORONARY ANGIOPLASTY WITH STENT PLACEMENT  ~ 2010  . CORONARY ARTERY BYPASS GRAFT  11/11/2015   CABG X2 LIMA-LAD and SVG-OM/notes 01/13/2016  . DILATION AND CURETTAGE OF UTERUS    . FEMORAL-POPLITEAL BYPASS GRAFT Left 2013   Archie Endo 01/13/2016  . LAPAROSCOPIC GASTRIC BANDING  2007  . RENAL ARTERY STENT    . VAGINAL HYSTERECTOMY      Home Medications:  Allergies as of 04/20/2016      Reactions   Other Swelling   Smoke--Head swelling   Statins Swelling   Amoxicillin    Codeine    Sulfa Antibiotics    Duloxetine Nausea Only      Medication List  Accurate as of 04/20/16  4:29 PM. Always use your most recent med list.          alfuzosin 10 MG 24 hr tablet Commonly known as:  UROXATRAL Take 1 tablet (10 mg total) by mouth daily with breakfast.   aspirin 81 MG tablet Take 81 mg by mouth daily.   clopidogrel 75 MG tablet Commonly known as:  PLAVIX Take 75 mg by mouth daily.   diclofenac sodium 1 % Gel Commonly known as:  VOLTAREN Apply 2 g topically daily as needed for pain.   fentaNYL 50 MCG/HR Commonly known as:  DURAGESIC - dosed mcg/hr Place 25 mcg onto the skin every 3 (three) days.   gabapentin 300 MG capsule Commonly known as:  NEURONTIN Take 300-600 mg by mouth See  admin instructions. Pt takes 627m in am, 3044mat bedtime   hydrocortisone cream 1 % Apply topically as needed for itching.   HYDROmorphone 2 MG tablet Commonly known as:  DILAUDID Take 2 mg by mouth every 4 (four) hours as needed for severe pain.   isosorbide mononitrate 30 MG 24 hr tablet Commonly known as:  IMDUR Take 1 tablet (30 mg total) by mouth daily.   lisinopril 5 MG tablet Commonly known as:  PRINIVIL,ZESTRIL Take 1 tablet (5 mg total) by mouth daily.   metFORMIN 500 MG tablet Commonly known as:  GLUCOPHAGE Take 1,000 mg by mouth 2 (two) times daily with a meal.   metoprolol tartrate 25 MG tablet Commonly known as:  LOPRESSOR Take 0.5 tablets (12.5 mg total) by mouth 2 (two) times daily.   nitroGLYCERIN 0.4 MG SL tablet Commonly known as:  NITROSTAT Place 1 tablet (0.4 mg total) under the tongue every 5 (five) minutes as needed for chest pain.   ondansetron 4 MG disintegrating tablet Commonly known as:  ZOFRAN-ODT Take 4 mg by mouth every 8 (eight) hours as needed for nausea or vomiting.   promethazine 12.5 MG tablet Commonly known as:  PHENERGAN Take 1-2 every 6 hours prn for nausea   senna-docusate 8.6-50 MG tablet Commonly known as:  Senokot-S Take 2 tablets by mouth 2 (two) times daily as needed for constipation.   Vitamin D3 50000 units Caps Take 1 capsule by mouth 2 (two) times a week.       Allergies:  Allergies  Allergen Reactions  . Other Swelling    Smoke--Head swelling  . Statins Swelling  . Amoxicillin   . Codeine   . Sulfa Antibiotics   . Duloxetine Nausea Only    Family History: Family History  Problem Relation Age of Onset  . Diabetes Mother   . Heart failure Mother   . Prostate cancer Neg Hx   . Kidney cancer Neg Hx   . Bladder Cancer Neg Hx     Social History:  reports that she has never smoked. She has never used smokeless tobacco. She reports that she drinks alcohol. She reports that she does not use  drugs.  ROS: UROLOGY Frequent Urination?: No Hard to postpone urination?: No Burning/pain with urination?: No Get up at night to urinate?: No Leakage of urine?: No Urine stream starts and stops?: No Trouble starting stream?: No Do you have to strain to urinate?: No Blood in urine?: No Urinary tract infection?: No Sexually transmitted disease?: No Injury to kidneys or bladder?: No Painful intercourse?: No Weak stream?: No Currently pregnant?: No Vaginal bleeding?: No Last menstrual period?: n  Gastrointestinal Nausea?: Yes Vomiting?: No Indigestion/heartburn?: No Diarrhea?: Yes Constipation?:  No  Constitutional Fever: No Night sweats?: No Weight loss?: Yes Fatigue?: No  Skin Skin rash/lesions?: No Itching?: No  Eyes Blurred vision?: No Double vision?: No  Ears/Nose/Throat Sore throat?: No Sinus problems?: Yes  Hematologic/Lymphatic Swollen glands?: No Easy bruising?: No  Cardiovascular Leg swelling?: No Chest pain?: No  Respiratory Cough?: No Shortness of breath?: No  Endocrine Excessive thirst?: No  Musculoskeletal Back pain?: No Joint pain?: No  Neurological Headaches?: No Dizziness?: No  Psychologic Depression?: No Anxiety?: No  Physical Exam: BP (!) 124/58   Pulse 95   Ht _0  (1.575 m)   Wt 158 lb 11.2 oz (72 kg)   BMI 29.03 kg/m   Constitutional:  Alert and oriented, No acute distress. HEENT: [place] AT, moist mucus membranes.  Trachea midline, no masses. Cardiovascular: No clubbing, cyanosis, or edema. Respiratory: Normal respiratory effort, no increased work of breathing. GI: Abdomen is soft, nontender, nondistended, no abdominal masses GU: No CVA tenderness.  Skin: No rashes, bruises or suspicious lesions. Lymph: No cervical or inguinal adenopathy. Neurologic: Grossly intact, no focal deficits, moving all 4 extremities. Psychiatric: Normal mood and affect.  Laboratory Data: Lab Results  Component Value Date   WBC 14.1  (H) 04/06/2016   HGB 10.9 (L) 04/06/2016   HCT 33.4 (L) 04/06/2016   MCV 79.4 04/06/2016   PLT 272.0 04/06/2016    Lab Results  Component Value Date   CREATININE 1.33 (H) 04/06/2016     Lab Results  Component Value Date   HGBA1C 7.6 (H) 01/14/2016    Pertinent Imaging: CLINICAL DATA:  History of left UPJ stone on CT scan of March 28, 2016  EXAM: RENAL / URINARY TRACT ULTRASOUND COMPLETE  COMPARISON:  CT urogram of March 28, 2016  FINDINGS: Right Kidney:  Length: 9.3 cm. There are punctate echogenic foci in the right kidney. In the lower pole there is a 9 mm diameter nonobstructing stone. There is no hydronephrosis. The renal cortical echotexture remains lower than that of the liver.  Left Kidney:  Length: 8.2 cm. The renal cortical echotexture similar to that of the right kidney. No hydronephrosis or stones are observed.  Bladder:  Appears normal for degree of bladder distention.  IMPRESSION: Interval resolution of mild hydronephrosis on the left. The UPJ stone is not visible. No other stones on the left are observed. There are several tiny stones in the right kidney with the largest measuring 9 mm lying in the lower pole. There is no right-sided hydronephrosis.   Electronically Signed   By: David  Martinique M.D.   On: 04/13/2016 15:02   Assessment & Plan:    1. Left ureteral stone  - no stone seen on KUB  - no hydronephrosis seen on RUS   2. Left hydronephrosis  - hydronephrosis has resolved.    3. Right renal stones  - seen on RUS - not visible on CT  - follow with yearly [place] to contact our office or seek treatment in the ED if becomes febrile or pain/ vomiting are difficult control in order to arrange for emergent/urgent intervention   Return in about 1 year (around 04/20/2017) for KUB and office visit.  Zara Council, [facility] [address], [place], [place]  [address] [phone]

## 2016-04-20 ENCOUNTER — Ambulatory Visit (INDEPENDENT_AMBULATORY_CARE_PROVIDER_SITE_OTHER): Payer: Medicare Other | Admitting: Urology

## 2016-04-20 ENCOUNTER — Other Ambulatory Visit: Payer: Self-pay | Admitting: Family Medicine

## 2016-04-20 ENCOUNTER — Encounter: Payer: Medicare Other | Attending: Internal Medicine | Admitting: Internal Medicine

## 2016-04-20 ENCOUNTER — Encounter: Payer: Self-pay | Admitting: Urology

## 2016-04-20 VITALS — BP 124/58 | HR 95 | Ht 62.0 in | Wt 158.7 lb

## 2016-04-20 DIAGNOSIS — E1165 Type 2 diabetes mellitus with hyperglycemia: Secondary | ICD-10-CM | POA: Insufficient documentation

## 2016-04-20 DIAGNOSIS — N132 Hydronephrosis with renal and ureteral calculous obstruction: Secondary | ICD-10-CM | POA: Diagnosis not present

## 2016-04-20 DIAGNOSIS — Z7982 Long term (current) use of aspirin: Secondary | ICD-10-CM | POA: Diagnosis not present

## 2016-04-20 DIAGNOSIS — Z951 Presence of aortocoronary bypass graft: Secondary | ICD-10-CM | POA: Insufficient documentation

## 2016-04-20 DIAGNOSIS — L98498 Non-pressure chronic ulcer of skin of other sites with other specified severity: Secondary | ICD-10-CM | POA: Diagnosis not present

## 2016-04-20 DIAGNOSIS — Z7902 Long term (current) use of antithrombotics/antiplatelets: Secondary | ICD-10-CM | POA: Diagnosis not present

## 2016-04-20 DIAGNOSIS — Z880 Allergy status to penicillin: Secondary | ICD-10-CM | POA: Insufficient documentation

## 2016-04-20 DIAGNOSIS — L03313 Cellulitis of chest wall: Secondary | ICD-10-CM | POA: Insufficient documentation

## 2016-04-20 DIAGNOSIS — N200 Calculus of kidney: Secondary | ICD-10-CM

## 2016-04-20 DIAGNOSIS — Z7984 Long term (current) use of oral hypoglycemic drugs: Secondary | ICD-10-CM | POA: Insufficient documentation

## 2016-04-20 DIAGNOSIS — N201 Calculus of ureter: Secondary | ICD-10-CM | POA: Diagnosis not present

## 2016-04-20 DIAGNOSIS — T8131XD Disruption of external operation (surgical) wound, not elsewhere classified, subsequent encounter: Secondary | ICD-10-CM | POA: Insufficient documentation

## 2016-04-20 DIAGNOSIS — L03113 Cellulitis of right upper limb: Secondary | ICD-10-CM | POA: Diagnosis not present

## 2016-04-20 DIAGNOSIS — C9000 Multiple myeloma not having achieved remission: Secondary | ICD-10-CM | POA: Insufficient documentation

## 2016-04-20 DIAGNOSIS — R1084 Generalized abdominal pain: Secondary | ICD-10-CM

## 2016-04-20 DIAGNOSIS — I2510 Atherosclerotic heart disease of native coronary artery without angina pectoris: Secondary | ICD-10-CM | POA: Diagnosis not present

## 2016-04-20 DIAGNOSIS — Y839 Surgical procedure, unspecified as the cause of abnormal reaction of the patient, or of later complication, without mention of misadventure at the time of the procedure: Secondary | ICD-10-CM | POA: Diagnosis not present

## 2016-04-20 DIAGNOSIS — I252 Old myocardial infarction: Secondary | ICD-10-CM | POA: Diagnosis not present

## 2016-04-20 DIAGNOSIS — R112 Nausea with vomiting, unspecified: Secondary | ICD-10-CM

## 2016-04-22 NOTE — Progress Notes (Signed)
KHARLIE, BRING ([id number]) Visit Report for 04/20/2016 Arrival Information Details Patient Name: [name], [name] Date of Service: 04/20/2016 9:15 AM Medical Record Patient Account Number: [id number] Number: Treating RN: Montey Hora 1940-07-08 (76 y.o. Other Clinician: Date of Birth/Sex: Female) Treating ROBSON, MICHAEL Primary Care Abdulkareem Badolato: Lavera [name] Yesha Muchow/Extender: G Referring Mort Smelser: [name] in Treatment: 16 Visit Information History Since Last Visit Added or deleted any medications: No Patient Arrived: Walker Any new allergies or adverse reactions: No Arrival Time: 09:25 Had [name] or experienced change in No Accompanied By: self activities of daily living that may affect Transfer Assistance: None risk of falls: Patient Identification Verified: Yes Signs or symptoms of abuse/neglect since last No Secondary Verification Process Completed: Yes visito Patient Requires Transmission-Based No Hospitalized since last visit: No Precautions: Has Dressing in Place as Prescribed: No Patient Has Alerts: Yes Pain Present Now: No Electronic Signature(s) Signed: 04/20/2016 4:57:29 PM By: Montey Hora Entered By: Montey Hora on 04/20/2016 09:26:08 [name] ([id number]) -------------------------------------------------------------------------------- Clinic Level of Care Assessment Details Patient Name: [name] Date of Service: 04/20/2016 9:15 AM Medical Record Patient Account Number: [id number] Number: Treating RN: Montey Hora 1940-01-23 (76 y.o. Other Clinician: Date of Birth/Sex: Female) Treating ROBSON, McGregor Primary Care Darold Miley: Lavera [name] Vashaun Osmon/Extender: G Referring Tennie Grussing: [name] in Treatment: 16 Clinic Level of Care Assessment Items TOOL 4 Quantity Score []  - Use when only an EandM is performed on FOLLOW-UP visit 0 ASSESSMENTS - Nursing Assessment / Reassessment X - Reassessment of  Co-morbidities (includes updates in patient status) 1 10 X - Reassessment of Adherence to Treatment Plan 1 5 ASSESSMENTS - Wound and Skin Assessment / Reassessment X - Simple Wound Assessment / Reassessment - one wound 1 5 []  - Complex Wound Assessment / Reassessment - multiple wounds 0 []  - Dermatologic / Skin Assessment (not related to wound area) 0 ASSESSMENTS - Focused Assessment []  - Circumferential Edema Measurements - multi extremities 0 []  - Nutritional Assessment / Counseling / Intervention 0 []  - Lower Extremity Assessment (monofilament, tuning fork, pulses) 0 []  - Peripheral Arterial Disease Assessment (using hand held doppler) 0 ASSESSMENTS - Ostomy and/or Continence Assessment and Care []  - Incontinence Assessment and Management 0 []  - Ostomy Care Assessment and Management (repouching, etc.) 0 PROCESS - Coordination of Care X - Simple Patient / Family Education for ongoing care 1 15 []  - Complex (extensive) Patient / Family Education for ongoing care 0 []  - Staff obtains Programmer, systems, Records, Test Results / Process Orders 0 []  - Staff telephones HHA, Nursing Homes / Clarify orders / etc 0 [place], [name]. ([id number]) []  - Routine Transfer to another Facility (non-emergent condition) 0 []  - Routine Hospital Admission (non-emergent condition) 0 []  - New Admissions / Biomedical engineer / Ordering NPWT, Apligraf, etc. 0 []  - Emergency Hospital Admission (emergent condition) 0 X - Simple Discharge Coordination 1 10 []  - Complex (extensive) Discharge Coordination 0 PROCESS - Special Needs []  - Pediatric / Minor Patient Management 0 []  - Isolation Patient Management 0 []  - Hearing / Language / Visual special needs 0 []  - Assessment of Community assistance (transportation, D/C planning, etc.) 0 []  - Additional assistance / Altered mentation 0 []  - Support Surface(s) Assessment (bed, cushion, seat, etc.) 0 INTERVENTIONS - Wound Cleansing / Measurement X - Simple Wound Cleansing  - one wound 1 5 []  - Complex Wound Cleansing - multiple wounds 0 X - Wound Imaging (photographs - any number of wounds) 1 5 []  -  Wound Tracing (instead of photographs) 0 X - Simple Wound Measurement - one wound 1 5 []  - Complex Wound Measurement - multiple wounds 0 INTERVENTIONS - Wound Dressings []  - Small Wound Dressing one or multiple wounds 0 []  - Medium Wound Dressing one or multiple wounds 0 []  - Large Wound Dressing one or multiple wounds 0 []  - Application of Medications - topical 0 []  - Application of Medications - injection 0 [name] ([id number]) INTERVENTIONS - Miscellaneous []  - External ear exam 0 []  - Specimen Collection (cultures, biopsies, blood, body fluids, etc.) 0 []  - Specimen(s) / Culture(s) sent or taken to Lab for analysis 0 []  - Patient Transfer (multiple staff / Harrel Lemon Lift / Similar devices) 0 []  - Simple Staple / Suture removal (25 or less) 0 []  - Complex Staple / Suture removal (26 or more) 0 []  - Hypo / Hyperglycemic Management (close monitor of Blood Glucose) 0 []  - Ankle / Brachial Index (ABI) - do not check if billed separately 0 X - Vital Signs 1 5 Has the patient been seen at the hospital within the last three years: Yes Total Score: 65 Level Of Care: New/Established - Level 2 Electronic Signature(s) Signed: 04/20/2016 4:57:29 PM By: Montey Hora Entered By: Montey Hora on 04/20/2016 09:43:15 Garn, Starlynn [name]. ([id number]) -------------------------------------------------------------------------------- Encounter Discharge Information Details Patient Name: [name] Date of Service: 04/20/2016 9:15 AM Medical Record Patient Account Number: [id number] Number: Treating RN: Montey Hora [birth year] (76 y.o. Other Clinician: Date of Birth/Sex: Female) Treating ROBSON, MICHAEL Primary Care Kayla Deshaies: Lavera [name] Auna Mikkelsen/Extender: G Referring Midge Momon: [name] in Treatment: 16 Encounter Discharge Information  Items Discharge Pain Level: 0 Discharge Condition: Stable Ambulatory Status: Walker Discharge Destination: Home Transportation: Private Auto Accompanied By: self Schedule Follow-up Appointment: No Medication Reconciliation completed No and provided to Patient/Care Betzabeth Derringer: Provided on Clinical Summary of Care: 04/20/2016 Form Type Recipient Paper Patient AW Electronic Signature(s) Signed: 04/20/2016 9:43:44 AM By: Ruthine Dose Entered By: Ruthine Dose on 04/20/2016 09:43:44 Philbrick, Jara [name]. ([id number]) -------------------------------------------------------------------------------- Multi Wound Chart Details Patient Name: Byrer, Keydi [name]. Date of Service: 04/20/2016 9:15 AM Medical Record Patient Account Number: [id number] Number: Treating RN: Montey Hora April 15, 1940 (75 y.o. Other Clinician: Date of Birth/Sex: Female) Treating ROBSON, MICHAEL Primary Care Saranne Crislip: Lavera [name] Braelyn Jenson/Extender: G Referring Queen Abbett: [name] in Treatment: 16 Vital Signs Height(in): 62 Pulse(bpm): 69 Weight(lbs): 161 Blood Pressure 116/46 (mmHg): Body Mass Index(BMI): 29 Temperature(F): 97.7 Respiratory Rate 18 (breaths/min): Photos: [3:No Photos] [N/[name]:N/[name]] Wound Location: [3:Right Hand - 5th Digit] [N/[name]:N/[name]] Wounding Event: [3:Blister] [N/[name]:N/[name]] Primary Etiology: [3:Cellulitis] [N/[name]:N/[name]] Date Acquired: [3:03/24/2016] [N/[name]:N/[name]] Weeks of Treatment: [3:2] [N/[name]:N/[name]] Wound Status: [3:Healed - Epithelialized] [N/[name]:N/[name]] Measurements L x W x D 0x0x0 [N/[name]:N/[name]] (cm) Area (cm) : [3:0] [N/[name]:N/[name]] Volume (cm) : [3:0] [N/[name]:N/[name]] % Reduction in Area: [3:100.00%] [N/[name]:N/[name]] % Reduction in Volume: 100.00% [N/[name]:N/[name]] Classification: [3:Partial Thickness] [N/[name]:N/[name]] Periwound Skin Texture: No Abnormalities Noted [N/[name]:N/[name]] Periwound Skin [3:No Abnormalities Noted] [N/[name]:N/[name]] Moisture: Periwound Skin Color: No Abnormalities Noted [N/[name]:N/[name]] Tenderness on [3:No]  [N/[name]:N/[name]] Treatment Notes Electronic Signature(s) Signed: 04/20/2016 5:28:32 PM By: [name] MD Entered By: [name] on 04/20/2016 09:51:43 Gafford, Myrah [name]. ([id number]) Ander, Saya AMarland Kitchen ([id number]) -------------------------------------------------------------------------------- Multi-Disciplinary Care Plan Details Patient Name: Andujar, Donnika [name]. Date of Service: 04/20/2016 9:15 AM Medical Record Patient Account Number: [id number] Number: Treating RN: Montey Hora 01-Jul-1940 (75 y.o. Other Clinician: Date of Birth/Sex: Female) Treating ROBSON, MICHAEL Primary Care Brittany Amirault: Lavera [name] Laterrica Libman/Extender: G Referring Arrington Yohe: Lavera [name]  Weeks in Treatment: 16 Active Inactive Electronic Signature(s) Signed: 04/20/2016 4:57:29 PM By: Montey Hora Entered By: Montey Hora on 04/20/2016 09:39:53 Dunavan, Mesha [name]. ([id number]) -------------------------------------------------------------------------------- Pain Assessment Details Patient Name: Velaquez, Jareli [name]. Date of Service: 04/20/2016 9:15 AM Medical Record Patient Account Number: [id number] Number: Treating RN: Montey Hora [birth year] (75 y.o. Other Clinician: Date of Birth/Sex: Female) Treating ROBSON, MICHAEL Primary Care Annita Ratliff: Lavera [name] Srihith Aquilino/Extender: G Referring Giavonna Pflum: [name] in Treatment: 16 Active Problems Location of Pain Severity and Description of Pain Patient Has Paino No Site Locations Pain Management and Medication Current Pain Management: Notes Topical or injectable lidocaine is offered to patient for acute pain when surgical debridement is performed. If needed, Patient is instructed to use over the counter pain medication for the following 24-48 hours after debridement. Wound care MDs do not prescribed pain medications. Patient has chronic pain or uncontrolled pain. Patient has been instructed to make an appointment with their Primary Care Physician for  pain management. Electronic Signature(s) Signed: 04/20/2016 4:57:29 PM By: Montey Hora Entered By: Montey Hora on 04/20/2016 09:27:53 Lemelin, [name] ([id number]) -------------------------------------------------------------------------------- Patient/Caregiver Education Details Patient Name: Straub, Tianni [name]. Date of Service: 04/20/2016 9:15 AM Medical Record Patient Account Number: [id number] Number: Treating RN: Montey Hora 09/19/1940 (75 y.o. Other Clinician: Date of Birth/Gender: Female) Treating ROBSON, MICHAEL Primary Care Physician: Lavera [name] Physician/Extender: G Referring Physician: [name] in Treatment: 16 Education Assessment Education Provided To: Patient Education Topics Provided Basic Hygiene: Handouts: Other: care of newly healed ulcer sites Methods: Explain/Verbal Responses: State content correctly Electronic Signature(s) Signed: 04/20/2016 4:57:29 PM By: Montey Hora Entered By: Montey Hora on 04/20/2016 09:39:31 Rivet, Vanshika [name]. ([id number]) -------------------------------------------------------------------------------- Wound Assessment Details Patient Name: Bulson, Tekesha [name]. Date of Service: 04/20/2016 9:15 AM Medical Record Patient Account Number: [id number] Number: Treating RN: Montey Hora 1940/10/22 (75 y.o. Other Clinician: Date of Birth/Sex: Female) Treating ROBSON, MICHAEL Primary Care Milicent Acheampong: Lavera [name] Nael Petrosyan/Extender: G Referring David Rodriquez: [name] in Treatment: 16 Wound Status Wound Number: 3 Primary Etiology: Cellulitis Wound Location: Right Hand - 5th Digit Wound Status: Healed - Epithelialized Wounding Event: Blister Date Acquired: 03/24/2016 Weeks Of Treatment: 2 Clustered Wound: No Photos Photo Uploaded By: Montey Hora on 04/20/2016 10:05:16 Wound Measurements Length: (cm) 0 % Reduction in Width: (cm) 0 % Reduction in Depth: (cm) 0 Area: (cm) 0 Volume: (cm)  0 Area: 100% Volume: 100% Wound Description Classification: Partial Thickness Periwound Skin Texture Texture Color No Abnormalities Noted: No No Abnormalities Noted: No Moisture No Abnormalities Noted: No Electronic Signature(s) Monnig, Marcel [name]. ([id number]) Signed: 04/20/2016 4:57:29 PM By: Montey Hora Entered By: Montey Hora on 04/20/2016 09:38:30 Peckham, Joselle [name]. ([id number]) -------------------------------------------------------------------------------- Vitals Details Patient Name: Sabina, Elmira [name]. Date of Service: 04/20/2016 9:15 AM Medical Record Patient Account Number: [id number] Number: Treating RN: Montey Hora 05-Jun-1940 (75 y.o. Other Clinician: Date of Birth/Sex: Female) Treating ROBSON, MICHAEL Primary Care Kayode Petion: Lavera [name] Nichlos Kunzler/Extender: G Referring Kiyan Burmester: [name] in Treatment: 16 Vital Signs Time Taken: 09:28 Temperature (F): 97.7 Height (in): 62 Pulse (bpm): 69 Weight (lbs): 161 Respiratory Rate (breaths/min): 18 Body Mass Index (BMI): 29.4 Blood Pressure (mmHg): 116/46 Reference Range: 80 - 120 mg / dl Electronic Signature(s) Signed: 04/20/2016 4:57:29 PM By: Montey Hora Entered By: Montey Hora on 04/20/2016 09:30:20

## 2016-04-22 NOTE — Progress Notes (Signed)
[name], [name] ([id number]) Visit Report for 04/20/2016 Chief Complaint Document Details Patient Name: [name], [name] Date of Service: 04/20/2016 9:15 AM Medical Record Patient Account Number: [id number] Number: Treating RN: Montey Hora 1940-10-19 (76 y.o. Other Clinician: Date of Birth/Sex: Female) Treating Finnlee Guarnieri Primary Care Provider: Lavera [name] Provider/Extender: G Referring Provider: [name] in Treatment: 16 Information Obtained from: Patient Chief Complaint 12/24/15 patient is here for review of to nonhealing surgical wounds in a CABG scar Electronic Signature(s) Signed: 04/20/2016 5:28:32 PM By: [name] MD Entered By: [name] on 04/20/2016 09:51:51 Pechacek, Rowland. ([id number]) -------------------------------------------------------------------------------- HPI Details Patient Name: [name], [name] Date of Service: 04/20/2016 9:15 AM Medical Record Patient Account Number: [id number] Number: Treating RN: Montey Hora 1940-10-27 (76 y.o. Other Clinician: Date of Birth/Sex: Female) Treating Treonna Klee Primary Care Provider: Lavera [name] Provider/Extender: G Referring Provider: [name] in Treatment: 16 History of Present Illness HPI Description: 12/24/15; [name] is a 76 year old woman who is the mother of one of our other clinic patient's [name] He comes [place] today for review a nonhealing surgical CABG wound. The patient underwent CABG o2 on October 31 17 at [facility]. She apparently had had a myocardial infarction in October but briefly presented with shortness of breath. She underwent surgery which I understand was uneventful. According to the patient her surgical incision never really healed in 2 spots. One is at the inferior recess question drain site, the other more superiorly about an inch from the superior surface of this it incision. She has been followed by her surgeon at  [facility] who felt that both areas which he'll according to the patient. She was put on Augmentin 2 weeks ago and she is finished a course of this culture at that time showed Escherichia coli which should've been sensitive. The patient also relates that she has a history of allergy to ampicillin. Besides this the patient has a history of type 2 diabetes on metformin, multiple myeloma. She had a chest x-ray in mid October the did not show any abnormalities. I cannot see that she had any specific views of the sternum however. We have her referral note from her primary physician Dr. Bernie Covey. She was concerned about erythema around the wound and as mentioned the culture here showed Escherichia coli which should have been Augmentin sensitive but was resistant to ampicillin, Keflex third generation cephalosporins quinolones and sulfonamides. [ESBL). The patient does not complain of fever or chills excessive pain. She does say that the lower surgical wound has been draining what looks to her like pus. She has been using triple antibiotic cream. 12/31/15 superior wound actually looks somewhat better. Inferiorly still a wound with some depth greater than 2 cm. Using Aquacel Ag. Culture I did of the drainage from the inferior wound and this surgical incision last week was negative 01/07/16; superior wound still requires debridement of surface eschar and nonviable subcutaneous tissue. Inferior wound measured at 1.8 cm. What I can see of the tissue here does not look very healthy. Original culture that I did on her presentation was negative however 01/27/16; the superior wound is closed she still has a deep probing wound of roughly 2 cm inferiorly. There is no overt infection no surrounding erythema. The patient tells me that 2 weeks ago she was developing chest pain and underwent a nuclear stress test. This was apparently negative however she developed chest pain shortly thereafter and had to go have a  cardiac  catheterization that did not show blockages. Since then she is not having chest pain but has unrelenting nausea making it difficult for her to eat. We reviewed her medications and she is on aspirin and Plavix however she apparently has a stent in place as well. 02/03/16; still using silver alginate. Depth of this is down from 1.8 cm the circumference is smaller and the depth down by 2 mm red no drainage and no surrounding infection 02/10/16; still using silver alginate depth down to 1.3 cm today measured by myself. Appears to be making slow progress. There is no surrounding tenderness.. The patient states she has unrelenting nausea which is nonexertional. Not related to meals. She has had no vomiting. She also tells me she is had previous lap Zapien, [name] ([id number]) band surgery but does not give clear reflux type symptoms 02/25/16; the wound orifice of this lady's wound on her chest is down to 1 cm in depth and much smaller in terms of the overall diameter. She tells me that she was packing the wound recently and developed quite a bit of bleeding that took several hours to stop she is on Plavix and aspirin. She is not having any exertional symptoms that sound like coronary artery disease. She is complaining of unrelenting nausea but she is being evaluated by GI for this and apparently has an endoscopy planned for March. We have been using iodoform packing 03/03/16; small wound in terms of orifice but measures 1.2 cm in depth, not much different from last time. She still complains of nausea and has an upper GI series books she is not describing exertional chest pain, shortness of breath, nausea or fatigue. 03/16/16; I follow this patient for a nonhealing surgical wound at a site of a previous CABG scar. She developed cellulitis in the incision postoperatively. Today she arrives with the wound orifice to miniscule to even consider measuring a depth. She came in with a Band-Aid over this, she  could not get any of the iodoform packing in the increasingly small orifice 03/31/16; the patient's nonhealing surgical wound at the site of her previous CABG scar/inferior aspect is totally epithelialized. She tells me there was some bleeding out of this which was minimal last week however with careful inspection there is no evidence of any open here everything looks healthy and epithelialize. Surrounding tissue also looks normal the patient arrives today with erythema and a significant blister mostly involving the dorsal aspect of her right fifth finger. There were 2 small almost looked like puncture wounds on the anterior aspect of the DIP joint and the medial aspect of the finger. The area was tender and warm. There was no involvement of any joint 04/20/16; The patient returns today for one last followup. Her original chest wound has remained closed which is fortunate, She came in last time with a right 5th finger blister and erythema. she did not tolerate the aquacel AG. C+S I did was negative. since I last saw her she has a skin tear on the right lateral elbow while doing a medical test Electronic Signature(s) Signed: 04/20/2016 5:28:32 PM By: [name] MD Entered By: [name] on 04/20/2016 09:55:33 [name], [name] ([id number]) -------------------------------------------------------------------------------- Physical Exam Details Patient Name: [name], [name] Date of Service: 04/20/2016 9:15 AM Medical Record Patient Account Number: [id number] Number: Treating RN: Montey Hora 01/26/1940 (75 y.o. Other Clinician: Date of Birth/Sex: Female) Treating Toran Murch Primary Care Provider: Lavera [name] Provider/Extender: G Referring Provider: [name] in Treatment: 21  Constitutional Sitting or standing Blood Pressure is within target range for patient.. Pulse regular and within target range for patient.Marland Kitchen Respirations regular, non-labored and within  target range.. Temperature is normal and within the target range for the patient.. Patient's appearance is neat and clean. Appears in no acute distress. Well nourished and well developed.. Musculoskeletal no 5th right PIP or dip problem in the 5th finger. No tophi. Integumentary (Hair, Skin) chest wound remains closed. Psychiatric No evidence of depression, anxiety, or agitation. Calm, cooperative, and communicative. Appropriate interactions and affect.. Notes Wound exam; chest wound is closed. (CABG scar). 5th finger looks fine. radial pulses are normal. the skin tear on the right arm looks fine and is just about closed Electronic Signature(s) Signed: 04/20/2016 5:28:32 PM By: [name] MD Entered By: [name] on 04/20/2016 09:58:19 [name], [name] ([id number]) -------------------------------------------------------------------------------- Physician Orders Details Patient Name: [name], [name] Date of Service: 04/20/2016 9:15 AM Medical Record Patient Account Number: [id number] Number: Treating RN: Montey Hora 1940/05/08 (75 y.o. Other Clinician: Date of Birth/Sex: Female) Treating Dustie Brittle Primary Care Provider: Lavera [name] Provider/Extender: G Referring Provider: [name] in Treatment: 76 Verbal / Phone Orders: No Diagnosis Coding Discharge From [facility] Services o Discharge from [place] Signature(s) Signed: 04/20/2016 4:57:29 PM By: Montey Hora Signed: 04/20/2016 5:28:32 PM By: [name] MD Entered By: Montey Hora on 04/20/2016 09:40:08 Bluestein, Ellyson AMarland Kitchen ([id number]) -------------------------------------------------------------------------------- Problem List Details Patient Name: [name], [name] Date of Service: 04/20/2016 9:15 AM Medical Record Patient Account Number: [id number] Number: Treating RN: Montey Hora 12-15-1940 (75 y.o. 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 ([id number]) -------------------------------------------------------------------------------- Progress Note Details Patient Name: [name], [name] Date of Service: 04/20/2016 9:15 AM Medical Record Patient Account Number: [id number] Number: Treating RN: Montey Hora 1940-06-11 (75 y.o. Other Clinician: Date of Birth/Sex: Female) Treating Terren Jandreau Primary Care Provider: Lavera [name] Provider/Extender: G Referring Provider: [name] in Treatment: 16 Subjective Chief Complaint Information obtained from Patient 12/24/15 patient is here for review of to nonhealing surgical wounds in a CABG scar History of Present Illness (HPI) 12/24/15; Mrs. Demo is a 76 year old woman who is the mother of one of our other clinic patient's [name] Kasper. He comes [place] today for review a nonhealing surgical CABG wound. The patient underwent CABG o2 on October 31 17 at [facility]. She apparently had had a myocardial infarction in October but briefly presented with shortness of breath. She underwent surgery which I understand was uneventful. According to the patient her surgical incision never really healed in 2 spots. One is at the inferior recess question drain site, the other more  superiorly about an inch from the superior surface of this it incision. She has been followed by her surgeon at [facility] who felt that both areas which he'll according to the patient. She was put on Augmentin 2 weeks ago and she is finished a course of this culture at that  time showed Escherichia coli which should've been sensitive. The patient also relates that she has a history of allergy to ampicillin. Besides this the patient has a history of type 2 diabetes on metformin, multiple myeloma. She had a chest x-ray in mid October the did not show any abnormalities. I cannot see that she had any specific views of the sternum however. We have her referral note from her primary physician Dr. Bernie Covey. She was concerned about erythema around the wound and as mentioned the culture here showed Escherichia coli which should have been Augmentin sensitive but was resistant to ampicillin, Keflex third generation cephalosporins quinolones and sulfonamides. [ESBL). The patient does not complain of fever or chills excessive pain. She does say that the lower surgical wound has been draining what looks to her like pus. She has been using triple antibiotic cream. 12/31/15 superior wound actually looks somewhat better. Inferiorly still a wound with some depth greater than 2 cm. Using Aquacel Ag. Culture I did of the drainage from the inferior wound and this surgical incision last week was negative 01/07/16; superior wound still requires debridement of surface eschar and nonviable subcutaneous tissue. Inferior wound measured at 1.8 cm. What I can see of the tissue here does not look very healthy. Original culture that I did on her presentation was negative however 01/27/16; the superior wound is closed she still has a deep probing wound of roughly 2 cm inferiorly. There is no overt infection no surrounding erythema. The patient tells me that 2 weeks ago she was developing chest pain and underwent a nuclear  stress test. This was apparently negative however she developed chest pain shortly thereafter and had to go have a cardiac catheterization that did not show blockages. Since then she is not having chest pain but has unrelenting nausea making it difficult for her to eat. We reviewed her [name], [name] ([id number]) medications and she is on aspirin and Plavix however she apparently has a stent in place as well. 02/03/16; still using silver alginate. Depth of this is down from 1.8 cm the circumference is smaller and the depth down by 2 mm red no drainage and no surrounding infection 02/10/16; still using silver alginate depth down to 1.3 cm today measured by myself. Appears to be making slow progress. There is no surrounding tenderness.. The patient states she has unrelenting nausea which is nonexertional. Not related to meals. She has had no vomiting. She also tells me she is had previous lap band surgery but does not give clear reflux type symptoms 02/25/16; the wound orifice of this lady's wound on her chest is down to 1 cm in depth and much smaller in terms of the overall diameter. She tells me that she was packing the wound recently and developed quite a bit of bleeding that took several hours to stop she is on Plavix and aspirin. She is not having any exertional symptoms that sound like coronary artery disease. She is complaining of unrelenting nausea but she is being evaluated by GI for this and apparently has an endoscopy planned for March. We have been using iodoform packing 03/03/16; small wound in terms of orifice but measures 1.2 cm in depth, not much different from last time. She still complains of nausea and has an upper GI series books she is not describing exertional chest pain, shortness of breath, nausea or fatigue. 03/16/16; I follow this patient for a nonhealing surgical wound at a site of a previous CABG scar. She developed cellulitis in  the incision postoperatively. Today she arrives  with the wound orifice to miniscule to even consider measuring a depth. She came in with a Band-Aid over this, she could not get any of the iodoform packing in the increasingly small orifice 03/31/16; the patient's nonhealing surgical wound at the site of her previous CABG scar/inferior aspect is totally epithelialized. She tells me there was some bleeding out of this which was minimal last week however with careful inspection there is no evidence of any open here everything looks healthy and epithelialize. Surrounding tissue also looks normal the patient arrives today with erythema and a significant blister mostly involving the dorsal aspect of her right fifth finger. There were 2 small almost looked like puncture wounds on the anterior aspect of the DIP joint and the medial aspect of the finger. The area was tender and warm. There was no involvement of any joint 04/20/16; The patient returns today for one last followup. Her original chest wound has remained closed which is fortunate, She came in last time with a right 5th finger blister and erythema. she did not tolerate the aquacel AG. C+S I did was negative. since I last saw her she has a skin tear on the right lateral elbow while doing a medical test Objective Constitutional Sitting or standing Blood Pressure is within target range for patient.. Pulse regular and within target range for patient.Marland Kitchen Respirations regular, non-labored and within target range.. Temperature is normal and within the target range for the patient.. Patient's appearance is neat and clean. Appears in no acute distress. Well nourished and well developed.. Vitals Time Taken: 9:28 AM, Height: 62 in, Weight: 161 lbs, BMI: 29.4, Temperature: 97.7 F, Pulse: 69 bpm, Respiratory Rate: 18 breaths/min, Blood Pressure: 116/46 mmHg. [name], [name] ([id number]) Musculoskeletal no 5th right PIP or dip problem in the 5th finger. No tophi. Psychiatric No evidence of depression,  anxiety, or agitation. Calm, cooperative, and communicative. Appropriate interactions and affect.. General Notes: Wound exam; chest wound is closed. (CABG scar). 5th finger looks fine. radial pulses are normal. the skin tear on the right arm looks fine and is just about closed Integumentary (Hair, Skin) chest wound remains closed. Wound #3 status is Healed - Epithelialized. Original cause of wound was Blister. The wound is located on the Right Hand - 5th Digit. The wound measures 0cm length x 0cm width x 0cm depth; 0cm^2 area and 0cm^3 volume. 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 No active concerns [name], [name] ([id number]) Electronic Signature(s) Signed: 04/20/2016 5:28:32 PM By: [name] MD Entered By: [name] on 04/20/2016 09:58:49 Penson, [name] A. ([id number]) -------------------------------------------------------------------------------- SuperBill Details Patient Name: Creek, Deonne A. Date of Service: 04/20/2016 Medical Record Patient Account Number: [id number] Number: Treating RN: Montey Hora 1940-03-16 (75 y.o. 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

## 2016-04-28 ENCOUNTER — Ambulatory Visit
Admission: RE | Admit: 2016-04-28 | Discharge: 2016-04-28 | Disposition: A | Discharge status: Home / Self Care | Payer: Medicare Other | Source: Ambulatory Visit | Attending: Family Medicine | Admitting: Family Medicine

## 2016-04-28 DIAGNOSIS — R1084 Generalized abdominal pain: Secondary | ICD-10-CM

## 2016-04-28 DIAGNOSIS — R112 Nausea with vomiting, unspecified: Secondary | ICD-10-CM

## 2016-05-04 ENCOUNTER — Other Ambulatory Visit: Payer: Medicare Other | Admitting: *Deleted

## 2016-05-04 DIAGNOSIS — E785 Hyperlipidemia, unspecified: Secondary | ICD-10-CM

## 2016-05-05 LAB — LIPID PANEL
Chol/HDL Ratio: 3.5 ratio (ref 0.0–4.4)
Cholesterol, Total: 162 mg/dL (ref 100–199)
HDL: 46 mg/dL (ref >39)
LDL Calculated: 68 mg/dL (ref 0–99)
Triglycerides: 241 mg/dL — ABNORMAL HIGH (ref 0–149)
VLDL Cholesterol Cal: 48 mg/dL — ABNORMAL HIGH (ref 5–40)

## 2016-05-05 LAB — HEPATIC FUNCTION PANEL
ALT: 10 IU/L (ref 0–32)
AST: 14 IU/L (ref 0–40)
Albumin: 4.1 g/dL (ref 3.5–4.8)
Alkaline Phosphatase: 54 IU/L (ref 39–117)
Bilirubin Total: <0.2 mg/dL (ref 0.0–1.2)
Bilirubin, Direct: 0.09 mg/dL (ref 0.00–0.40)
Total Protein: 6.2 g/dL (ref 6.0–8.5)

## 2016-05-06 ENCOUNTER — Other Ambulatory Visit: Payer: Self-pay | Admitting: Pharmacist

## 2016-05-06 ENCOUNTER — Telehealth: Payer: Self-pay | Admitting: *Deleted

## 2016-05-06 ENCOUNTER — Other Ambulatory Visit: Payer: Medicare Other

## 2016-05-06 DIAGNOSIS — E785 Hyperlipidemia, unspecified: Secondary | ICD-10-CM

## 2016-05-06 MED ORDER — FISH OIL 1000 MG PO CAPS: 2000.0000 mg | ORAL_CAPSULE | Freq: Every day | ORAL | 0 refills | Status: DC

## 2016-05-06 NOTE — Telephone Encounter (Signed)
-----   Message from Dorothy Spark, MD sent at 05/06/2016 12:09 PM EDT ----- Thank you! Sorry I missed that,  ----- Message ----- From: Leeroy Bock, RPH Sent: 05/06/2016  11:37 AM To: Dorothy Spark, MD, Nuala Alpha, LPN  Pt was seen in lipid clinic in February and started on Praluent 75mg /mL, follow up labs show LDL at goal < 70 with 49% reduction from 133 to 68. Continue Praluent 75mg /mL. However, TG are elevated - would see if pt is eating a diet high in carbs, sugar, or alcohol, and advise her to limit these if she is. If not, could start fish oil 2g daily with repeat lipids in 3-6 months.

## 2016-05-06 NOTE — Telephone Encounter (Signed)
Notified the pt of lipid results per Dr Meda Coffee and Pharmacist Megan Supple.  Asked the pt if she has increased her carb, sweets, or alcohol intake, for her triglycerides are elevated.  Per the pt, she states she drinks no alcohol, and avoids carbs and sweets.  Informed the pt that given this information, Dr Meda Coffee and Jinny Blossom both agree that we add fish oil 2 grams (2000 mg) po daily to her regimen, and have her come in for repeat lipids in 3-6 months.  Confirmed the pharmacy of choice with the pt.  Scheduled the pt a lab appt to recheck lipids, on 09/06/16.  Pt aware to come fasting to that lab appt.  Pt verbalized understanding and agrees with this plan.

## 2016-05-10 ENCOUNTER — Encounter: Payer: Self-pay | Admitting: Cardiology

## 2016-05-10 ENCOUNTER — Ambulatory Visit (INDEPENDENT_AMBULATORY_CARE_PROVIDER_SITE_OTHER): Payer: Medicare Other | Admitting: Cardiology

## 2016-05-10 ENCOUNTER — Encounter
Admission: RE | Admit: 2016-05-10 | Discharge: 2016-05-10 | Disposition: A | Discharge status: Home / Self Care | Payer: Medicare Other | Source: Ambulatory Visit | Attending: Family Medicine | Admitting: Family Medicine

## 2016-05-10 VITALS — BP 122/72 | HR 68 | Ht 62.0 in | Wt 159.0 lb

## 2016-05-10 DIAGNOSIS — Z951 Presence of aortocoronary bypass graft: Secondary | ICD-10-CM | POA: Diagnosis not present

## 2016-05-10 DIAGNOSIS — E782 Mixed hyperlipidemia: Secondary | ICD-10-CM | POA: Diagnosis not present

## 2016-05-10 DIAGNOSIS — R112 Nausea with vomiting, unspecified: Secondary | ICD-10-CM

## 2016-05-10 DIAGNOSIS — E785 Hyperlipidemia, unspecified: Secondary | ICD-10-CM

## 2016-05-10 DIAGNOSIS — R072 Precordial pain: Secondary | ICD-10-CM | POA: Diagnosis not present

## 2016-05-10 DIAGNOSIS — R1084 Generalized abdominal pain: Secondary | ICD-10-CM | POA: Insufficient documentation

## 2016-05-10 DIAGNOSIS — I25708 Atherosclerosis of coronary artery bypass graft(s), unspecified, with other forms of angina pectoris: Secondary | ICD-10-CM | POA: Diagnosis not present

## 2016-05-10 DIAGNOSIS — I2510 Atherosclerotic heart disease of native coronary artery without angina pectoris: Secondary | ICD-10-CM | POA: Diagnosis not present

## 2016-05-10 DIAGNOSIS — R600 Localized edema: Secondary | ICD-10-CM

## 2016-05-10 DIAGNOSIS — I209 Angina pectoris, unspecified: Secondary | ICD-10-CM | POA: Diagnosis not present

## 2016-05-10 HISTORY — DX: Nausea with vomiting, unspecified: R11.2

## 2016-05-10 MED ORDER — TECHNETIUM TC 99M MEBROFENIN IV KIT
5.0000 | PACK | Freq: Once | INTRAVENOUS | Status: AC | PRN
  Administered 2016-05-10: 5.127 via INTRAVENOUS

## 2016-05-10 MED ORDER — ISOSORBIDE MONONITRATE ER 60 MG PO TB24: 60.0000 mg | ORAL_TABLET | Freq: Every day | ORAL | 3 refills | Status: DC

## 2016-05-10 MED ORDER — FUROSEMIDE 20 MG PO TABS: 20.0000 mg | ORAL_TABLET | Freq: Every day | ORAL | 3 refills | Status: DC | PRN

## 2016-05-10 NOTE — Progress Notes (Signed)
Cardiology Office Note    Date:  05/10/2016   ID:  [name], DOB Dec 23, 1940, MRN [id number]  PCP:  [name] Jude, MD  Cardiologist:   [name] Dawley, MD   Chief complain: post-hospitalization follow up  History of Present Illness:  [name] is a 76 y.o. female with a past medical history of CAD s/p CABG, DM, HTN, PAD s/p L fem pop in 2013, multiple myeloma, and ischemic cardiomyopathy. She presented to the ED on 01/13/15 with severe back pain which is historically her anginal equivalent.  [name] did not feel well all day on New Years Day, felt tired and very nauseous. She took multiple doses of Zofran which intermittently helped her nausea. She went to sleep and was awakened with severe left sided back pain with associated SOB and continued nausea.   She called EMS and was transport to [facility]. Her EKG on arrival showed NSR with evidence of old anterior and inferior infarcts. No acute ST changes when compared to her prior EKG's. She continued to feel nauseous but her back pain has been alleviated somewhat.   In May 2017 she presented to [facility] with abdominal pain and chest discomfort, she ruled in for NSTEMI and underwent heart catheterization. She had DES placed to the mid and distal LAD as well as OM2. Also with mid circumflex lesion that was not intervened upon as it was felt to be chronic.   In October 2017 she presented to [facility] with a 2 day history of persistent nausea and vomiting. In the ED she developed left sided jaw pain and substernal chest pressure. Pharmacologic stress test was abnormal and she underwent LHC. She subsequently underwent 2 vessel CABG on 11/11/15 with LIMA-LAD and SVG-OM.   Her last Echo was in August 2017 at that time her EF was 45-50%, moderate MR, septal basilar akinesis.   She tells me that her back pain is her anginal equivalent (although not charted as such when notes reviewed in [place]). She suffers from chronic pain  from her multiple myeloma but says that her pain is usually in her legs.   Troponin is negative.   LHC: 01/15/2016 patent bypass grafts.  2. The distal left main has a 70% stenosis.  3. The LAD has diffuse proximal 90% stenosis. The mid LAD stent has 80% restenosis in the proximal segment of the stent. The mid and distal vessel fills from the patent LIMA graft. Competitive flow is seen in the mid LAD on antegrade injections.  4. The Circumflex has a patent mid stent. The first OM branch has diffuse moderate stenosis. The vein graft inserts into this OM branch. The entire Circumflex and OM system fills from antegrade flow and from the vein graft.  5. The RCA is a small to moderate caliber non-dominant vessel with 100% proximal occlusion. The entire vessel fills from left to right collaterals.   Recommendations: No targets for PCI. Both grafts are open. The RCA is chronically occluded but fills from collaterals. Continue medical management of CAD.   02/23/2016 - the patient is coming after 1 months, in the interim she hasn't experienced any chest pain. She has been experiencing right-sided abdominal pain after every meal, her primary care physician order abdominal ultrasound that didn't show any signs of acute cholecystitis. The patient has lost 40 pounds in the last 2 months. She continues to experience chills, she has been treated for urinary tract infection that is recurrent in her case. No fever no cough  recent chest x-ray negative for any pneumonia. No palpitations or syncope no lower extremity edema orthopnea or proximal nocturnal dyspnea. She has been experiencing significant fatigue and orthostatic hypotension.  05/10/16 - 3 months follow-up, at the last visit she was just pain-free but complained of orthostatic hypotension and we decreased her dose of lmdur. She now complains of severe jaw pain that happens 2-3 times a month at rest and is relieved by nitroglycerin almost immediately. She denies  any palpitations syncope or falls she is occasionally on and off lower extremity edema that is mild. She was approved for Praluent and her LDL dropped from 133-66, she was started on fish oil for high triglycerides.  Past Medical History:  Diagnosis Date  . Anginal pain ([place])   . Anxiety   . Arthritis    "shoulders primarily" (01/13/2016)  . CHF (congestive heart failure) ([place])   . Chronic pain    from her multiple myeloma but says that her pain is usually in her legs/notes 01/13/2016  . High cholesterol   . History of blood transfusion    "numerous; related to procedures for my heart" (01/13/2016)  . Hypertension   . Ischemic cardiomyopathy    /nots 01/13/2016  . MI (myocardial infarction) ([place])    EKG on arrival 01/13/2016 showed NSR with evidence of old anterior and inferior infarcts  . MI (myocardial infarction) ([place]) 08/2015  . Multiple myeloma ([place]) dx'd 2015  . PAD (peripheral artery disease) ([place])   . Stroke [facility])    "several in 1 year; ? year" (01/13/2016)  . Type II diabetes mellitus ([place])     Past Surgical History:  Procedure Laterality Date  . CARDIAC CATHETERIZATION N/A 01/14/2016   Procedure: Left Heart Cath and Cors/Grafts Angiography;  Surgeon: Burnell Blanks, MD;  Location: [place] [facility];  Service: Cardiovascular;  Laterality: N/A;  . CARPAL TUNNEL RELEASE Left   . CORONARY ANGIOPLASTY WITH STENT PLACEMENT  05/2015   DES placed to the mid and distal LAD as well as OM2/notes 01/13/2016  . CORONARY ANGIOPLASTY WITH STENT PLACEMENT  ~ 2010  . CORONARY ARTERY BYPASS GRAFT  11/11/2015   CABG X2 LIMA-LAD and SVG-OM/notes 01/13/2016  . DILATION AND CURETTAGE OF UTERUS    . FEMORAL-POPLITEAL BYPASS GRAFT Left 2013   Archie Endo 01/13/2016  . LAPAROSCOPIC GASTRIC BANDING  2007  . RENAL ARTERY STENT    . VAGINAL HYSTERECTOMY      Current Medications: Outpatient Medications Prior to Visit  Medication Sig Dispense Refill  . alfuzosin (UROXATRAL) 10 MG 24 hr tablet Take 1  tablet (10 mg total) by mouth daily with breakfast. 14 tablet 0  . Alirocumab (PRALUENT) 75 MG/ML SOPN Inject 1 pen into the skin every 14 (fourteen) days. 2 pen 11  . aspirin 81 MG tablet Take 81 mg by mouth daily.    . Cholecalciferol (VITAMIN D3) 50000 units CAPS Take 1 capsule by mouth 2 (two) times a week.    . clopidogrel (PLAVIX) 75 MG tablet Take 75 mg by mouth daily.    . diclofenac sodium (VOLTAREN) 1 % GEL Apply 2 g topically daily as needed for pain.    . fentaNYL (DURAGESIC - DOSED MCG/HR) 50 MCG/HR Place 25 mcg onto the skin every 3 (three) days.     Marland Kitchen gabapentin (NEURONTIN) 300 MG capsule Take 300-600 mg by mouth See admin instructions. Pt takes 672m in am, 3030mat bedtime    . hydrocortisone cream 1 % Apply topically as needed for  itching. 30 g 0  . HYDROmorphone (DILAUDID) 2 MG tablet Take 2 mg by mouth every 4 (four) hours as needed for severe pain.    Marland Kitchen lisinopril (PRINIVIL,ZESTRIL) 5 MG tablet Take 1 tablet (5 mg total) by mouth daily. 30 tablet 12  . metFORMIN (GLUCOPHAGE) 500 MG tablet Take 1,000 mg by mouth 2 (two) times daily with a meal.     . metoprolol tartrate (LOPRESSOR) 25 MG tablet Take 0.5 tablets (12.5 mg total) by mouth 2 (two) times daily. 90 tablet 3  . nitroGLYCERIN (NITROSTAT) 0.4 MG SL tablet Place 1 tablet (0.4 mg total) under the tongue every 5 (five) minutes as needed for chest pain. 25 tablet 2  . Omega-3 Fatty Acids (FISH OIL) 1000 MG CAPS Take 2 capsules (2,000 mg total) by mouth daily. 60 capsule 0  . ondansetron (ZOFRAN-ODT) 4 MG disintegrating tablet Take 4 mg by mouth every 8 (eight) hours as needed for nausea or vomiting.    . promethazine (PHENERGAN) 12.5 MG tablet Take 1-2 every 6 hours prn for nausea 50 tablet 3  . senna-docusate (SENOKOT-S) 8.6-50 MG tablet Take 2 tablets by mouth 2 (two) times daily as needed for constipation.    . isosorbide mononitrate (IMDUR) 30 MG 24 hr tablet Take 1 tablet (30 mg total) by mouth daily. 90 tablet 3    Facility-Administered Medications Prior to Visit  Medication Dose Route Frequency Provider Last Rate Last Dose  . 0.9 %  sodium chloride infusion  500 mL Intravenous Continuous Milus Banister, MD         Allergies:   Other; Statins; Amoxicillin; Codeine; Sulfa antibiotics; and Duloxetine   Social History   Social History  . Marital status: Divorced    Spouse name: N/A  . Number of children: 2  . Years of education: N/A   Occupational History  . retired    Social History Main Topics  . Smoking status: Never Smoker  . Smokeless tobacco: Never Used  . Alcohol use Yes     Comment: occasional wine  . Drug use: No  . Sexual activity: Not Asked   Other Topics Concern  . None   Social History Narrative  . None    Family History:  The patient's family history includes Diabetes in her mother; Heart failure in her mother.   ROS:   Please see the history of present illness.    ROS All other systems reviewed and are negative.  PHYSICAL EXAM:   VS:  BP 122/72   Pulse 68   Ht 5' 2"  (1.575 m)   Wt 159 lb (72.1 kg)   BMI 29.08 kg/m    GEN: Well nourished, well developed, in no acute distress  HEENT: normal  Neck: no JVD, carotid bruits, or masses Cardiac: RRR; no murmurs, rubs, or gallops,no edema  Respiratory:  clear to auscultation bilaterally, normal work of breathing GI: soft, nontender, nondistended, + BS MS: no deformity or atrophy  Skin: warm and dry, no rash Neuro:  Alert and Oriented x 3, Strength and sensation are intact Psych: euthymic mood, full affect  Wt Readings from Last 3 Encounters:  05/10/16 159 lb (72.1 kg)  04/20/16 158 lb 11.2 oz (72 kg)  04/06/16 155 lb (70.3 kg)      Studies/Labs Reviewed:   EKG:  Not done today  Recent Labs: 01/13/2016: B Natriuretic Peptide 438.8 02/23/2016: TSH 3.430 04/06/2016: BUN 36; Creatinine, Ser 1.33; Hemoglobin 10.9; Platelets 272.0; Potassium 4.3; Sodium 136 05/04/2016: ALT 10  Lipid Panel    Component  Value Date/Time   CHOL 162 05/04/2016 1306   TRIG 241 (H) 05/04/2016 1306   HDL 46 05/04/2016 1306   CHOLHDL 3.5 05/04/2016 1306   CHOLHDL 6.7 01/14/2016 0202   VLDL 43 (H) 01/14/2016 0202   LDLCALC 68 05/04/2016 1306   Additional studies/ records that were reviewed today include:   TTE: 01/2016 - Procedure narrative: Transthoracic echocardiography. Image   quality was poor. The study was technically difficult, as a   result of poor acoustic windows and surgical dressings.   Intravenous contrast (Definity) was administered. - Left ventricle: The cavity size was normal. Systolic function was   vigorous. The estimated ejection fraction was in the range of 65%   to 70%. Wall motion was normal; there were no regional wall   motion abnormalities. Doppler parameters are consistent with   abnormal left ventricular relaxation (grade 1 diastolic   dysfunction). The E/e&' ratio is between 8-15, suggesting   indeterminate LV filling pressure. - Inferior vena cava: The vessel was normal in size. The   respirophasic diameter changes were in the normal range (>= 50%),   consistent with normal central venous pressure.  Impressions:  - Technically difficult study. Definity contrast was administered.   LVEF 65-70%, normal wall motion, diastolic dysfunction,   indeterminate LV filling pressure, normal IVC.    ASSESSMENT:    1. Hyperlipidemia, unspecified hyperlipidemia type   2. Coronary artery disease of bypass graft of native heart with stable angina pectoris ([place])   3. S/P CABG x 2   4. Precordial pain   5. Lower extremity edema   6. Mixed hyperlipidemia      PLAN:  In order of problems listed above:  1. CAD: See cath report above. 2/2 grafts open. She now has worsening jaw pain that he's had a suspicion for ischemia as it responds to nitroglycerin almost immediately. I will increase Imdur from 30-60 mg daily. We will follow up in 4 weeks and consider stress test if no improvement  in symptoms. Will remain on ASA and Plavix.   2. S/p CABG - on low-dose metoprolol, Plavix and aspirin.   3. Hyperlipidemia - LDL 131 above goal <79m/dL given extensive history of ASCVD including CAD s/p MI and CABG, stroke, and PAD. Pt is intolerant to 5 statins and Zetia, experiencing severe joint pain with each of these. She was approved for Praluent, LDL 131->66, TG 240, started on fish oil 1000 mg.  3. Recurrent postprandial abdominal pain, will referred to GI for further evaluation. GB scan scheduled for tomorrow.  4. History of multiple myeloma: On home medications.[facility], we will try to find a specialist and refer her.  5. Sternal wound: post CABG, healed now by wound specialist.  6. Ischemic cardiomyopathy: Last EF 65-70%, continue beta blocker and ACE-I.   Medication Adjustments/Labs and Tests Ordered: Current medicines are reviewed at length with the patient today.  Concerns regarding medicines are outlined above.  Medication changes, Labs and Tests ordered today are listed in the Patient Instructions below. Patient Instructions  Medication Instructions:   INCREASE YOUR IMDUR (ISOSORBIDE MONONITRATE) TO 60 MG ONCE DAILY  START TAKING LASIX 20 MG BY MOUTH DAILY AS NEEDED FOR LOWER EXTREMITY SWELLING     Follow-Up:  1 MONTH WITH DR Sabria Florido---PLEASE ADD THIS ON TO ONE OF HER QUARTER DAYS       If you need  a refill on your cardiac medications before your next appointment, please call your pharmacy.      Signed, [name] Dawley, MD  05/10/2016 9:24 AM    [address], [place], [place]  [address] Phone: [phone]; Fax: [phone]

## 2016-05-10 NOTE — Patient Instructions (Signed)
Medication Instructions:   INCREASE YOUR IMDUR (ISOSORBIDE MONONITRATE) TO 60 MG ONCE DAILY  START TAKING LASIX 20 MG BY MOUTH DAILY AS NEEDED FOR LOWER EXTREMITY SWELLING     Follow-Up:  1 MONTH WITH DR NELSON---PLEASE ADD THIS ON TO ONE OF HER QUARTER DAYS       If you need a refill on your cardiac medications before your next appointment, please call your pharmacy.

## 2016-06-03 ENCOUNTER — Ambulatory Visit (INDEPENDENT_AMBULATORY_CARE_PROVIDER_SITE_OTHER): Payer: Medicare Other | Admitting: Cardiology

## 2016-06-03 VITALS — BP 132/78 | HR 80 | Ht 62.0 in | Wt 159.0 lb

## 2016-06-03 DIAGNOSIS — E785 Hyperlipidemia, unspecified: Secondary | ICD-10-CM | POA: Diagnosis not present

## 2016-06-03 DIAGNOSIS — Z951 Presence of aortocoronary bypass graft: Secondary | ICD-10-CM

## 2016-06-03 DIAGNOSIS — I10 Essential (primary) hypertension: Secondary | ICD-10-CM

## 2016-06-03 DIAGNOSIS — Z789 Other specified health status: Secondary | ICD-10-CM | POA: Diagnosis not present

## 2016-06-03 DIAGNOSIS — I25708 Atherosclerosis of coronary artery bypass graft(s), unspecified, with other forms of angina pectoris: Secondary | ICD-10-CM | POA: Diagnosis not present

## 2016-06-03 DIAGNOSIS — R61 Generalized hyperhidrosis: Secondary | ICD-10-CM | POA: Diagnosis not present

## 2016-06-03 DIAGNOSIS — I2510 Atherosclerotic heart disease of native coronary artery without angina pectoris: Secondary | ICD-10-CM

## 2016-06-03 DIAGNOSIS — I209 Angina pectoris, unspecified: Secondary | ICD-10-CM

## 2016-06-03 MED ORDER — CLOPIDOGREL BISULFATE 75 MG PO TABS: 75.0000 mg | ORAL_TABLET | Freq: Every day | ORAL | 2 refills | Status: DC

## 2016-06-03 NOTE — Patient Instructions (Signed)
Medication Instructions:   Your physician recommends that you continue on your current medications as directed. Please refer to the Current Medication list given to you today.    Labwork:  TODAY-  CMET, CBC W DIFF, TSH, AND LIPIDS      Follow-Up:  IN September WITH DR NELSON---PLEASE HAVE THIS APPOINTMENT SCHEDULED TODAY       If you need a refill on your cardiac medications before your next appointment, please call your pharmacy.

## 2016-06-03 NOTE — Progress Notes (Signed)
Cardiology Office Note    Date:  06/04/2016   ID:  Kai Levins Gruenberg, DOB 1940/11/10, MRN [id number]  PCP:  Lynnell Jude, MD  Cardiologist:   Ena Dawley, MD   Chief complain: 1 month follow up  History of Present Illness:  [name] is a 76 y.o. female with a past medical history of CAD s/p CABG, DM, HTN, PAD s/p L fem pop in 2013, multiple myeloma, and ischemic cardiomyopathy.  In May 2017 she presented to [facility] with abdominal pain and chest discomfort, she ruled in for NSTEMI and underwent heart catheterization. She had DES placed to the mid and distal LAD as well as OM2. Also with mid circumflex lesion that was not intervened upon as it was felt to be chronic.  In October 2017 she presented to [facility] with a 2 day history of persistent nausea and vomiting. In the ED she developed left sided jaw pain and substernal chest pressure. Pharmacologic stress test was abnormal and she underwent LHC. She subsequently underwent 2 vessel CABG on 11/11/15 with LIMA-LAD and SVG-OM.  She says that her back pain is her anginal equivalent (although not charted as such when notes reviewed in [place]). She suffers from chronic pain from her multiple myeloma but says that her pain is usually in her legs.   LHC: 01/15/2016 patent bypass grafts.  2. The distal left main has a 70% stenosis.  3. The LAD has diffuse proximal 90% stenosis. The mid LAD stent has 80% restenosis in the proximal segment of the stent. The mid and distal vessel fills from the patent LIMA graft. Competitive flow is seen in the mid LAD on antegrade injections.  4. The Circumflex has a patent mid stent. The first OM branch has diffuse moderate stenosis. The vein graft inserts into this OM branch. The entire Circumflex and OM system fills from antegrade flow and from the vein graft.  5. The RCA is a small to moderate caliber non-dominant vessel with 100% proximal occlusion. The entire vessel fills from left to right  collaterals.   Recommendations: No targets for PCI. Both grafts are open. The RCA is chronically occluded but fills from collaterals. Continue medical management of CAD.   02/23/2016 - the patient is coming after 1 months, in the interim she hasn't experienced any chest pain. She has been experiencing right-sided abdominal pain after every meal, her primary care physician order abdominal ultrasound that didn't show any signs of acute cholecystitis. The patient has lost 40 pounds in the last 2 months. She continues to experience chills, she has been treated for urinary tract infection that is recurrent in her case. No fever no cough recent chest x-ray negative for any pneumonia. No palpitations or syncope no lower extremity edema orthopnea or proximal nocturnal dyspnea. She has been experiencing significant fatigue and orthostatic hypotension.  05/10/16 - 3 months follow-up, at the last visit she was just pain-free but complained of orthostatic hypotension and we decreased her dose of lmdur. She now complains of severe jaw pain that happens 2-3 times a month at rest and is relieved by nitroglycerin almost immediately. She denies any palpitations syncope or falls she is occasionally on and off lower extremity edema that is mild. She was approved for Praluent and her LDL dropped from 133-66, she was started on fish oil for high triglycerides.  06/04/2016 - today she states that she has been feeling well, very rare chest pain, she only took NTG once since the last visit, she  denies DOE, no LE edema, orthopnea, PND. She has noticed night sweats for about a month. The only change to her medication was switching gabapentin to Lyrica at about the same time.   Past Medical History:  Diagnosis Date  . Anginal pain ([place])   . Anxiety   . Arthritis    "shoulders primarily" (01/13/2016)  . CHF (congestive heart failure) ([place])   . Chronic pain    from her multiple myeloma but says that her pain is usually in her  legs/notes 01/13/2016  . High cholesterol   . History of blood transfusion    "numerous; related to procedures for my heart" (01/13/2016)  . Hypertension   . Ischemic cardiomyopathy    /nots 01/13/2016  . MI (myocardial infarction) ([place])    EKG on arrival 01/13/2016 showed NSR with evidence of old anterior and inferior infarcts  . MI (myocardial infarction) ([place]) 08/2015  . Multiple myeloma ([place]) dx'd 2015  . Nausea and vomiting 05/10/2016   3-4 month hx of nausea and vomiting  . PAD (peripheral artery disease) ([place])   . Stroke [facility])    "several in 1 year; ? year" (01/13/2016)  . Type II diabetes mellitus ([place])     Past Surgical History:  Procedure Laterality Date  . CARDIAC CATHETERIZATION N/A 01/14/2016   Procedure: Left Heart Cath and Cors/Grafts Angiography;  Surgeon: Burnell Blanks, MD;  Location: [facility];  Service: Cardiovascular;  Laterality: N/A;  . CARPAL TUNNEL RELEASE Left   . CORONARY ANGIOPLASTY WITH STENT PLACEMENT  05/2015   DES placed to the mid and distal LAD as well as OM2/notes 01/13/2016  . CORONARY ANGIOPLASTY WITH STENT PLACEMENT  ~ 2010  . CORONARY ARTERY BYPASS GRAFT  11/11/2015   CABG X2 LIMA-LAD and SVG-OM/notes 01/13/2016  . DILATION AND CURETTAGE OF UTERUS    . FEMORAL-POPLITEAL BYPASS GRAFT Left 2013   Archie Endo 01/13/2016  . LAPAROSCOPIC GASTRIC BANDING  2007  . RENAL ARTERY STENT    . VAGINAL HYSTERECTOMY      Current Medications: Outpatient Medications Prior to Visit  Medication Sig Dispense Refill  . alfuzosin (UROXATRAL) 10 MG 24 hr tablet Take 1 tablet (10 mg total) by mouth daily with breakfast. 14 tablet 0  . Alirocumab (PRALUENT) 75 MG/ML SOPN Inject 1 pen into the skin every 14 (fourteen) days. 2 pen 11  . aspirin 81 MG tablet Take 81 mg by mouth daily.    . Cholecalciferol (VITAMIN D3) 50000 units CAPS Take 1 capsule by mouth 2 (two) times a week.    . diclofenac sodium (VOLTAREN) 1 % GEL Apply 2 g topically daily as needed for pain.     . fentaNYL (DURAGESIC - DOSED MCG/HR) 50 MCG/HR Place 50 mcg onto the skin every 3 (three) days.     . furosemide (LASIX) 20 MG tablet Take 1 tablet (20 mg total) by mouth daily as needed for fluid or edema. 30 tablet 3  . gabapentin (NEURONTIN) 300 MG capsule Take 300-600 mg by mouth See admin instructions. Pt takes 626m in am, 3068mat bedtime    . hydrocortisone cream 1 % Apply topically as needed for itching. 30 g 0  . HYDROmorphone (DILAUDID) 2 MG tablet Take 2 mg by mouth every 4 (four) hours as needed for severe pain.    . isosorbide mononitrate (IMDUR) 60 MG 24 hr tablet Take 1 tablet (60 mg total) by mouth daily. 90 tablet 3  . lisinopril (PRINIVIL,ZESTRIL) 5 MG tablet Take 1  tablet (5 mg total) by mouth daily. 30 tablet 12  . metFORMIN (GLUCOPHAGE) 500 MG tablet Take 1,000 mg by mouth 2 (two) times daily with a meal.     . nitroGLYCERIN (NITROSTAT) 0.4 MG SL tablet Place 1 tablet (0.4 mg total) under the tongue every 5 (five) minutes as needed for chest pain. 25 tablet 2  . Omega-3 Fatty Acids (FISH OIL) 1000 MG CAPS Take 2 capsules (2,000 mg total) by mouth daily. 60 capsule 0  . ondansetron (ZOFRAN-ODT) 4 MG disintegrating tablet Take 4 mg by mouth every 8 (eight) hours as needed for nausea or vomiting.    . promethazine (PHENERGAN) 12.5 MG tablet Take 1-2 every 6 hours prn for nausea 50 tablet 3  . senna-docusate (SENOKOT-S) 8.6-50 MG tablet Take 2 tablets by mouth 2 (two) times daily as needed for constipation.    . clopidogrel (PLAVIX) 75 MG tablet Take 75 mg by mouth daily.    . metoprolol tartrate (LOPRESSOR) 25 MG tablet Take 0.5 tablets (12.5 mg total) by mouth 2 (two) times daily. 90 tablet 3   Facility-Administered Medications Prior to Visit  Medication Dose Route Frequency Provider Last Rate Last Dose  . 0.9 %  sodium chloride infusion  500 mL Intravenous Continuous Milus Banister, MD         Allergies:   Other; Statins; Amoxicillin; Codeine; Sulfa antibiotics; and  Duloxetine   Social History   Social History  . Marital status: Divorced    Spouse name: N/A  . Number of children: 2  . Years of education: N/A   Occupational History  . retired    Social History Main Topics  . Smoking status: Never Smoker  . Smokeless tobacco: Never Used  . Alcohol use Yes     Comment: occasional wine  . Drug use: No  . Sexual activity: Not on file   Other Topics Concern  . Not on file   Social History Narrative  . No narrative on file    Family History:  The patient's family history includes Diabetes in her mother; Heart failure in her mother.   ROS:   Please see the history of present illness.    ROS All other systems reviewed and are negative.  PHYSICAL EXAM:   VS:  BP 132/78   Pulse 80   Ht 5' 2"  (1.575 m)   Wt 159 lb (72.1 kg)   BMI 29.08 kg/m    GEN: Well nourished, well developed, in no acute distress  HEENT: normal  Neck: no JVD, carotid bruits, or masses Cardiac: RRR; no murmurs, rubs, or gallops,no edema  Respiratory:  clear to auscultation bilaterally, normal work of breathing GI: soft, nontender, nondistended, + BS MS: no deformity or atrophy  Skin: warm and dry, no rash Neuro:  Alert and Oriented x 3, Strength and sensation are intact Psych: euthymic mood, full affect  Wt Readings from Last 3 Encounters:  06/03/16 159 lb (72.1 kg)  05/10/16 159 lb (72.1 kg)  04/20/16 158 lb 11.2 oz (72 kg)      Studies/Labs Reviewed:   EKG:  Not done today  Recent Labs: 01/13/2016: B Natriuretic Peptide 438.8 04/06/2016: Hemoglobin 10.9 06/03/2016: ALT 9; BUN 18; Creatinine, Ser 1.04; Platelets WILL FOLLOW; Potassium 5.4; Sodium 138; TSH 1.990   Lipid Panel    Component Value Date/Time   CHOL 127 06/03/2016 1102   TRIG 117 06/03/2016 1102   HDL 55 06/03/2016 1102   CHOLHDL 2.3 06/03/2016 1102   CHOLHDL  6.7 01/14/2016 0202   VLDL 43 (H) 01/14/2016 0202   LDLCALC 49 06/03/2016 1102   Additional studies/ records that were reviewed  today include:   TTE: 01/2016 - Procedure narrative: Transthoracic echocardiography. Image   quality was poor. The study was technically difficult, as a   result of poor acoustic windows and surgical dressings.   Intravenous contrast (Definity) was administered. - Left ventricle: The cavity size was normal. Systolic function was   vigorous. The estimated ejection fraction was in the range of 65%   to 70%. Wall motion was normal; there were no regional wall   motion abnormalities. Doppler parameters are consistent with   abnormal left ventricular relaxation (grade 1 diastolic   dysfunction). The E/e&' ratio is between 8-15, suggesting   indeterminate LV filling pressure. - Inferior vena cava: The vessel was normal in size. The   respirophasic diameter changes were in the normal range (>= 50%),   consistent with normal central venous pressure.  Impressions:  - Technically difficult study. Definity contrast was administered.   LVEF 65-70%, normal wall motion, diastolic dysfunction,   indeterminate LV filling pressure, normal IVC.    ASSESSMENT:    1. Coronary artery disease of bypass graft of native heart with stable angina pectoris ([place])   2. Hyperlipidemia, unspecified hyperlipidemia type   3. S/P CABG x 2   4. Essential hypertension   5. Statin intolerance   6. Night sweats      PLAN:  In order of problems listed above:  1. CAD: See cath report above. 2/2 grafts open. Chest pain has almost resolved with increased Imdur from 30-60 mg daily. She bruises easily, but agrees to continue ASA and Plavix until at least 10/2016.   2. S/p CABG - on low-dose metoprolol, Plavix and aspirin.   3. Hyperlipidemia -. She was approved for Praluent, LDL 131->49, TG 240, started on fish oil 1000 mg, repeat 117.  3. Recurrent postprandial abdominal pain, followed by GI.  4. History of multiple myeloma: On home medications.She sees Dr. Laverta Baltimore at [facility] for this. She would like to switch  to [facility] oncologist, we will try to find a specialist and refer her.  5. Night sweats - meds reviewed, she is advised to hold lyrica for 1 week, if no improvement than continue. We will obtain labs to check for an infection, she denies cough, fever or dysuria. TSH was normal.  6. Ischemic cardiomyopathy: Last EF 65-70%, continue beta blocker and ACE-I.   Medication Adjustments/Labs and Tests Ordered: Current medicines are reviewed at length with the patient today.  Concerns regarding medicines are outlined above.  Medication changes, Labs and Tests ordered today are listed in the Patient Instructions below. Patient Instructions  Medication Instructions:   Your physician recommends that you continue on your current medications as directed. Please refer to the Current Medication list given to you today.    Labwork:  TODAY-  CMET, CBC W DIFF, TSH, AND LIPIDS      Follow-Up:  IN September WITH DR Roselle Norton---PLEASE HAVE THIS APPOINTMENT SCHEDULED TODAY       If you need a refill on your cardiac medications before your next appointment, please call your pharmacy.      Signed, Ena Dawley, MD  06/04/2016 12:23 AM    [address], [place], [place]  [address] Phone: [phone]; Fax: [phone]

## 2016-06-04 ENCOUNTER — Telehealth: Payer: Self-pay | Admitting: Cardiology

## 2016-06-04 LAB — TSH: TSH: 1.99 u[IU]/mL (ref 0.450–4.500)

## 2016-06-04 LAB — COMPREHENSIVE METABOLIC PANEL
ALT: 9 IU/L (ref 0–32)
AST: 13 IU/L (ref 0–40)
Albumin/Globulin Ratio: 2.3 — ABNORMAL HIGH (ref 1.2–2.2)
Albumin: 4.3 g/dL (ref 3.5–4.8)
Alkaline Phosphatase: 57 IU/L (ref 39–117)
BUN/Creatinine Ratio: 17 (ref 12–28)
BUN: 18 mg/dL (ref 8–27)
Bilirubin Total: <0.2 mg/dL (ref 0.0–1.2)
CO2: 19 mmol/L (ref 18–29)
Calcium: 9.4 mg/dL (ref 8.7–10.3)
Chloride: 103 mmol/L (ref 96–106)
Creatinine, Ser: 1.04 mg/dL — ABNORMAL HIGH (ref 0.57–1.00)
GFR calc Af Amer: 61 mL/min/{1.73_m2} (ref >59)
GFR calc non Af Amer: 53 mL/min/{1.73_m2} — ABNORMAL LOW (ref >59)
Globulin, Total: 1.9 g/dL (ref 1.5–4.5)
Glucose: 243 mg/dL — ABNORMAL HIGH (ref 65–99)
Potassium: 5.4 mmol/L — ABNORMAL HIGH (ref 3.5–5.2)
Sodium: 138 mmol/L (ref 134–144)
Total Protein: 6.2 g/dL (ref 6.0–8.5)

## 2016-06-04 LAB — CBC WITH DIFFERENTIAL/PLATELET
Basophils Absolute: 0 10*3/uL (ref 0.0–0.2)
Basos: 0 %
EOS (ABSOLUTE): 0.2 10*3/uL (ref 0.0–0.4)
Eos: 2 %
Hematocrit: 34.7 % (ref 34.0–46.6)
Hemoglobin: 10.9 g/dL — ABNORMAL LOW (ref 11.1–15.9)
Immature Grans (Abs): 0 10*3/uL (ref 0.0–0.1)
Immature Granulocytes: 0 %
Lymphocytes Absolute: 6.5 10*3/uL — ABNORMAL HIGH (ref 0.7–3.1)
Lymphs: 67 %
MCH: 26 pg — ABNORMAL LOW (ref 26.6–33.0)
MCHC: 31.4 g/dL — ABNORMAL LOW (ref 31.5–35.7)
MCV: 83 fL (ref 79–97)
Monocytes Absolute: 0 10*3/uL — ABNORMAL LOW (ref 0.1–0.9)
Monocytes: 0 %
Neutrophils Absolute: 3.1 10*3/uL (ref 1.4–7.0)
Neutrophils: 31 %
Platelets: 256 10*3/uL (ref 150–379)
RBC: 4.19 x10E6/uL (ref 3.77–5.28)
RDW: 15.8 % — ABNORMAL HIGH (ref 12.3–15.4)
WBC: 9.9 10*3/uL (ref 3.4–10.8)

## 2016-06-04 LAB — LIPID PANEL
Chol/HDL Ratio: 2.3 ratio (ref 0.0–4.4)
Cholesterol, Total: 127 mg/dL (ref 100–199)
HDL: 55 mg/dL (ref >39)
LDL Calculated: 49 mg/dL (ref 0–99)
Triglycerides: 117 mg/dL (ref 0–149)
VLDL Cholesterol Cal: 23 mg/dL (ref 5–40)

## 2016-06-04 NOTE — Telephone Encounter (Signed)
Informed the pt that per Dr Meda Coffee, fish oil will not contribute to her elevated K.  Advised the pt to limit the K in her diet, as discussed yesterday on the phone with the pt.  Pt verbalized understanding and agrees with this plan.

## 2016-06-04 NOTE — Telephone Encounter (Signed)
Patient calling, states that there were some concerns about her potassium level. Patient states that she was prescribed 2000 mg of fish oil, one time a day and states that this may have something to do with elevated potassium level. Thanks.

## 2016-06-20 ENCOUNTER — Emergency Department
Admission: EM | Admit: 2016-06-20 | Discharge: 2016-06-20 | Disposition: A | Discharge status: Home / Self Care | Payer: Medicare Other | Attending: Emergency Medicine | Admitting: Emergency Medicine

## 2016-06-20 ENCOUNTER — Emergency Department: Payer: Medicare Other

## 2016-06-20 ENCOUNTER — Encounter: Payer: Self-pay | Admitting: Emergency Medicine

## 2016-06-20 DIAGNOSIS — Z5321 Procedure and treatment not carried out due to patient leaving prior to being seen by health care provider: Secondary | ICD-10-CM | POA: Insufficient documentation

## 2016-06-20 DIAGNOSIS — R1110 Vomiting, unspecified: Secondary | ICD-10-CM | POA: Insufficient documentation

## 2016-06-20 LAB — CBC
HCT: 35.1 % (ref 35.0–47.0)
Hemoglobin: 11.4 g/dL — ABNORMAL LOW (ref 12.0–16.0)
MCH: 26.9 pg (ref 26.0–34.0)
MCHC: 32.6 g/dL (ref 32.0–36.0)
MCV: 82.7 fL (ref 80.0–100.0)
Platelets: 213 10*3/uL (ref 150–440)
RBC: 4.25 MIL/uL (ref 3.80–5.20)
RDW: 15.3 % — AB (ref 11.5–14.5)
WBC: 11 10*3/uL (ref 3.6–11.0)

## 2016-06-20 LAB — COMPREHENSIVE METABOLIC PANEL
ALK PHOS: 48 U/L (ref 38–126)
ALT: 13 U/L — AB (ref 14–54)
AST: 17 U/L (ref 15–41)
Albumin: 4 g/dL (ref 3.5–5.0)
Anion gap: 8 (ref 5–15)
BUN: 26 mg/dL — AB (ref 6–20)
CALCIUM: 9.8 mg/dL (ref 8.9–10.3)
CHLORIDE: 105 mmol/L (ref 101–111)
CO2: 22 mmol/L (ref 22–32)
CREATININE: 0.99 mg/dL (ref 0.44–1.00)
GFR calc Af Amer: >60 mL/min (ref >60)
GFR calc non Af Amer: 54 mL/min — ABNORMAL LOW (ref >60)
Glucose, Bld: 181 mg/dL — ABNORMAL HIGH (ref 65–99)
Potassium: 4.3 mmol/L (ref 3.5–5.1)
SODIUM: 135 mmol/L (ref 135–145)
Total Bilirubin: 0.5 mg/dL (ref 0.3–1.2)
Total Protein: 6.8 g/dL (ref 6.5–8.1)

## 2016-06-20 LAB — URINALYSIS, COMPLETE (UACMP) WITH MICROSCOPIC
Bilirubin Urine: NEGATIVE
Glucose, UA: NEGATIVE mg/dL
Hgb urine dipstick: NEGATIVE
KETONES UR: NEGATIVE mg/dL
Nitrite: NEGATIVE
PH: 5 (ref 5.0–8.0)
Protein, ur: NEGATIVE mg/dL
SPECIFIC GRAVITY, URINE: 1.016 (ref 1.005–1.030)

## 2016-06-20 LAB — LIPASE, BLOOD: LIPASE: 29 U/L (ref 11–51)

## 2016-06-20 LAB — TROPONIN I: Troponin I: <0.03 ng/mL (ref <0.03)

## 2016-06-20 MED ORDER — ONDANSETRON 4 MG PO TBDP: 4.0000 mg | ORAL_TABLET | Freq: Once | ORAL | Status: DC | PRN

## 2016-06-20 NOTE — ED Notes (Signed)
Pt called to front desk to be revitalized, no answer from the lobby, pt called 2x by this tech, first nurse notified.

## 2016-06-20 NOTE — ED Triage Notes (Signed)
Pt c/o chest pain earlier today but has resolved.  She then started having vomiting today and now is also c/o SHOB. Unlabored in triage.  NAD. VSS.  No fevers known.  Did have loose bowel today per pt.  Has had yellow productive cough.

## 2016-06-20 NOTE — ED Notes (Signed)
Pt updated about wait by pt relations Kyra Manges.

## 2016-06-20 NOTE — ED Notes (Signed)
Reports took zofran 1 hr ago. Order cancelled.

## 2016-06-21 ENCOUNTER — Telehealth: Payer: Self-pay | Admitting: Emergency Medicine

## 2016-06-21 NOTE — Telephone Encounter (Signed)
Called patient due to lwot to inquire about condition and follow up plans. Left message.   

## 2016-07-22 ENCOUNTER — Other Ambulatory Visit: Payer: Self-pay | Admitting: Unknown Physician Specialty

## 2016-07-22 DIAGNOSIS — M799 Soft tissue disorder, unspecified: Secondary | ICD-10-CM

## 2016-07-22 DIAGNOSIS — M7989 Other specified soft tissue disorders: Secondary | ICD-10-CM

## 2016-07-22 DIAGNOSIS — M25511 Pain in right shoulder: Secondary | ICD-10-CM

## 2016-07-29 ENCOUNTER — Ambulatory Visit
Admission: RE | Admit: 2016-07-29 | Discharge: 2016-07-29 | Disposition: A | Discharge status: Home / Self Care | Payer: Medicare Other | Source: Ambulatory Visit | Attending: Unknown Physician Specialty | Admitting: Unknown Physician Specialty

## 2016-07-29 ENCOUNTER — Other Ambulatory Visit: Payer: Medicare Other

## 2016-07-29 DIAGNOSIS — M7989 Other specified soft tissue disorders: Secondary | ICD-10-CM | POA: Diagnosis present

## 2016-07-29 DIAGNOSIS — X58XXXA Exposure to other specified factors, initial encounter: Secondary | ICD-10-CM | POA: Insufficient documentation

## 2016-07-29 DIAGNOSIS — M25511 Pain in right shoulder: Secondary | ICD-10-CM | POA: Diagnosis not present

## 2016-07-29 DIAGNOSIS — M659 Synovitis and tenosynovitis, unspecified: Secondary | ICD-10-CM | POA: Diagnosis not present

## 2016-07-29 DIAGNOSIS — S46821A Laceration of other muscles, fascia and tendons at shoulder and upper arm level, right arm, initial encounter: Secondary | ICD-10-CM | POA: Diagnosis not present

## 2016-07-29 DIAGNOSIS — M799 Soft tissue disorder, unspecified: Secondary | ICD-10-CM

## 2016-07-29 DIAGNOSIS — M6250 Muscle wasting and atrophy, not elsewhere classified, unspecified site: Secondary | ICD-10-CM | POA: Insufficient documentation

## 2016-07-29 MED ORDER — GADOBENATE DIMEGLUMINE 529 MG/ML IV SOLN
10.0000 mL | Freq: Once | INTRAVENOUS | Status: AC | PRN
  Administered 2016-07-29: 10 mL via INTRAVENOUS

## 2016-08-03 ENCOUNTER — Ambulatory Visit: Payer: Medicare Other

## 2016-08-27 ENCOUNTER — Ambulatory Visit
Admission: EM | Admit: 2016-08-27 | Discharge: 2016-08-27 | Discharge status: Against Medical Advice | Payer: Medicare Other

## 2016-08-27 NOTE — ED Triage Notes (Signed)
Patient complains of left foot pain that started yesterday. Patient states that pain is along the side of her foot and worse with walking. Patient states that she does notice some discoloration but has had no known injury to the area.

## 2016-08-27 NOTE — ED Notes (Signed)
Patient decided she no longer wanted to wait to see a provider. Patient states that she was only one in clinic. I informed patient she currently had 3 people ahead of her and she said this was not accurate. Patient left clinic.

## 2016-09-01 ENCOUNTER — Telehealth: Payer: Self-pay | Admitting: Cardiology

## 2016-09-01 NOTE — Telephone Encounter (Signed)
Spoke with pt and made her aware that lab appt Monday is not needed. Advised lab appt was made in April and Dr. Meda Coffee drew lipid panel while pt was here in May and they were normal.  Advised I would cancel Mondays lab appt.  Pt appreciative for call.

## 2016-09-01 NOTE — Telephone Encounter (Signed)
New Message:    Pt wants to know if she can have her lab work that is scheduled for 09-06-16,combined with her lab work she is having at [facility] on 9-17-8 please.

## 2016-09-06 ENCOUNTER — Other Ambulatory Visit: Payer: Medicare Other

## 2016-09-12 ENCOUNTER — Emergency Department (HOSPITAL_COMMUNITY): Payer: Medicare Other

## 2016-09-12 ENCOUNTER — Encounter (HOSPITAL_COMMUNITY): Payer: Self-pay | Admitting: Emergency Medicine

## 2016-09-12 ENCOUNTER — Emergency Department (HOSPITAL_COMMUNITY)
Admission: EM | Admit: 2016-09-12 | Discharge: 2016-09-12 | Disposition: A | Discharge status: Home / Self Care | Payer: Medicare Other | Attending: Emergency Medicine | Admitting: Emergency Medicine

## 2016-09-12 DIAGNOSIS — I2510 Atherosclerotic heart disease of native coronary artery without angina pectoris: Secondary | ICD-10-CM | POA: Diagnosis not present

## 2016-09-12 DIAGNOSIS — Z955 Presence of coronary angioplasty implant and graft: Secondary | ICD-10-CM | POA: Diagnosis not present

## 2016-09-12 DIAGNOSIS — R110 Nausea: Secondary | ICD-10-CM | POA: Diagnosis present

## 2016-09-12 DIAGNOSIS — Z8673 Personal history of transient ischemic attack (TIA), and cerebral infarction without residual deficits: Secondary | ICD-10-CM | POA: Diagnosis not present

## 2016-09-12 DIAGNOSIS — E1151 Type 2 diabetes mellitus with diabetic peripheral angiopathy without gangrene: Secondary | ICD-10-CM | POA: Diagnosis not present

## 2016-09-12 DIAGNOSIS — R1011 Right upper quadrant pain: Secondary | ICD-10-CM | POA: Insufficient documentation

## 2016-09-12 DIAGNOSIS — Z7902 Long term (current) use of antithrombotics/antiplatelets: Secondary | ICD-10-CM | POA: Diagnosis not present

## 2016-09-12 DIAGNOSIS — Z79899 Other long term (current) drug therapy: Secondary | ICD-10-CM | POA: Diagnosis not present

## 2016-09-12 DIAGNOSIS — I509 Heart failure, unspecified: Secondary | ICD-10-CM | POA: Diagnosis not present

## 2016-09-12 DIAGNOSIS — Z7982 Long term (current) use of aspirin: Secondary | ICD-10-CM | POA: Insufficient documentation

## 2016-09-12 DIAGNOSIS — Z951 Presence of aortocoronary bypass graft: Secondary | ICD-10-CM | POA: Diagnosis not present

## 2016-09-12 DIAGNOSIS — Z7984 Long term (current) use of oral hypoglycemic drugs: Secondary | ICD-10-CM | POA: Insufficient documentation

## 2016-09-12 DIAGNOSIS — I110 Hypertensive heart disease with heart failure: Secondary | ICD-10-CM | POA: Diagnosis not present

## 2016-09-12 LAB — COMPREHENSIVE METABOLIC PANEL
ALK PHOS: 41 U/L (ref 38–126)
ALT: 16 U/L (ref 14–54)
AST: 18 U/L (ref 15–41)
Albumin: 3.6 g/dL (ref 3.5–5.0)
Anion gap: 9 (ref 5–15)
BILIRUBIN TOTAL: 0.7 mg/dL (ref 0.3–1.2)
BUN: 16 mg/dL (ref 6–20)
CALCIUM: 9.1 mg/dL (ref 8.9–10.3)
CO2: 23 mmol/L (ref 22–32)
CREATININE: 1.28 mg/dL — AB (ref 0.44–1.00)
Chloride: 102 mmol/L (ref 101–111)
GFR, EST AFRICAN AMERICAN: 46 mL/min — AB (ref >60)
GFR, EST NON AFRICAN AMERICAN: 40 mL/min — AB (ref >60)
Glucose, Bld: 183 mg/dL — ABNORMAL HIGH (ref 65–99)
Potassium: 4.6 mmol/L (ref 3.5–5.1)
Sodium: 134 mmol/L — ABNORMAL LOW (ref 135–145)
TOTAL PROTEIN: 6.1 g/dL — AB (ref 6.5–8.1)

## 2016-09-12 LAB — CBC
HCT: 34.7 % — ABNORMAL LOW (ref 36.0–46.0)
Hemoglobin: 11.1 g/dL — ABNORMAL LOW (ref 12.0–15.0)
MCH: 26.5 pg (ref 26.0–34.0)
MCHC: 32 g/dL (ref 30.0–36.0)
MCV: 82.8 fL (ref 78.0–100.0)
Platelets: 203 K/uL (ref 150–400)
RBC: 4.19 MIL/uL (ref 3.87–5.11)
RDW: 14.8 % (ref 11.5–15.5)
WBC: 8.7 K/uL (ref 4.0–10.5)

## 2016-09-12 LAB — LIPASE, BLOOD: LIPASE: 27 U/L (ref 11–51)

## 2016-09-12 LAB — I-STAT TROPONIN, ED: Troponin i, poc: 0 ng/mL (ref 0.00–0.08)

## 2016-09-12 MED ORDER — IOPAMIDOL (ISOVUE-300) INJECTION 61%
INTRAVENOUS | Status: AC
  Administered 2016-09-12: 80 mL via INTRAVENOUS
  Filled 2016-09-12: qty 100

## 2016-09-12 MED ORDER — ONDANSETRON 8 MG PO TBDP: 8.0000 mg | ORAL_TABLET | Freq: Three times a day (TID) | ORAL | 0 refills | Status: DC | PRN

## 2016-09-12 MED ORDER — DIPHENHYDRAMINE HCL 50 MG/ML IJ SOLN
12.5000 mg | Freq: Once | INTRAMUSCULAR | Status: AC
  Administered 2016-09-12: 12.5 mg via INTRAVENOUS
  Filled 2016-09-12: qty 1

## 2016-09-12 MED ORDER — METOCLOPRAMIDE HCL 5 MG/ML IJ SOLN
2.5000 mg | Freq: Once | INTRAMUSCULAR | Status: AC
  Administered 2016-09-12: 2.5 mg via INTRAVENOUS
  Filled 2016-09-12: qty 2

## 2016-09-12 MED ORDER — LORAZEPAM 2 MG/ML IJ SOLN
0.5000 mg | Freq: Once | INTRAMUSCULAR | Status: AC
Start: 2016-09-12 — End: 2016-09-12
  Administered 2016-09-12: 0.5 mg via INTRAVENOUS
  Filled 2016-09-12: qty 1

## 2016-09-12 NOTE — ED Notes (Signed)
Patient transported to CT 

## 2016-09-12 NOTE — ED Provider Notes (Signed)
[facility] DEPT Provider Note   CSN: [id number] Arrival date & time: 09/12/16  1153     History   Chief Complaint Chief Complaint  Patient presents with  . Nausea  . Chest Pain    HPI [name] is a 76 y.o. female.  76 year old female here complaining of epigastric pain that radiates to her back. States that for the past 48 hours she has had dry heaves as well as some left-sided abdominal discomfort. Denies any fever or chills. Denies any bilious emesis. No black or bloody stools. Denies any dysuria or hematuria. Denies any anginal type symptoms. Has been home medicated with Zofran without relief. Called EMS and was given Zofran IV and she continues to be nauseated. Denies any severe headaches or vertiginous symptoms. No association with eating.      Past Medical History:  Diagnosis Date  . Anginal pain ([place])   . Anxiety   . Arthritis    "shoulders primarily" (01/13/2016)  . CHF (congestive heart failure) ([employer])   . Chronic pain    from her multiple myeloma but says that her pain is usually in her legs/notes 01/13/2016  . High cholesterol   . History of blood transfusion    "numerous; related to procedures for my heart" (01/13/2016)  . Hypertension   . Ischemic cardiomyopathy    /nots 01/13/2016  . MI (myocardial infarction) ([place])    EKG on arrival 01/13/2016 showed NSR with evidence of old anterior and inferior infarcts  . MI (myocardial infarction) ([place]) 08/2015  . Multiple myeloma ([place]) dx'd 2015  . Nausea and vomiting 05/10/2016   3-4 month hx of nausea and vomiting  . PAD (peripheral artery disease) ([place])   . Stroke [facility])    "several in 1 year; ? year" (01/13/2016)  . Type II diabetes mellitus [facility])     Patient Active Problem List   Diagnosis Date Noted  . Lower extremity edema 05/10/2016  . Ischemic cardiomyopathy 02/23/2016  . Chills 02/23/2016  . Weight loss 02/23/2016  . Unstable angina pectoris ([place]) 01/13/2016  . Chest pain   . PVD (peripheral  vascular disease) ([place])   . Coronary artery disease involving coronary bypass graft of native heart with angina pectoris ([place])   . Peripheral arterial occlusive disease ([place]) 12/15/2015  . Acute blood loss anemia 11/13/2015  . Leukocytosis 11/13/2015  . Chronic pain 11/12/2015  . Acute postoperative pain 11/12/2015  . 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 Acute UTI 11/05/2015  . Edema of left lower extremity 06/16/2015  . Edema of upper extremity 06/16/2015  . Knee pain, left 06/14/2015  . AKI (acute kidney injury) ([place]) 06/11/2015  . Hyperkalemia 06/11/2015  . Hypomagnesemia 06/11/2015  . Acute leg pain, left 05/20/2015  . Multiple bruises 05/20/2015  . Recurrent urinary tract infection 03/26/2014  . Recurrent UTI 03/26/2014  . H/O gastric bypass 11/07/2013  . Peripheral neuropathy 10/10/2013  . Encounter for antineoplastic chemotherapy 10/10/2013  . Multiple myeloma ([place]) 07/16/2013  . Critical ischemia of lower extremity 05/15/2013  . Critical lower limb ischemia 05/15/2013  . Intermittent claudication ([place]) 07/27/2011  . Chronic coronary artery disease 04/26/2011  . Gastroesophageal reflux disease 04/26/2011  . Type 2 diabetes mellitus ([employer]) 03/30/2011  . Hyperlipidemia, unspecified 03/30/2011  . Hypertension 03/30/2011    Past Surgical History:  Procedure Laterality Date  . CARDIAC CATHETERIZATION N/A 01/14/2016   Procedure: Left Heart Cath and Cors/Grafts Angiography;  Surgeon: Burnell Blanks, MD;  Location: [facility];  Service: Cardiovascular;  Laterality: N/A;  . CARPAL TUNNEL RELEASE Left   . CORONARY ANGIOPLASTY WITH STENT PLACEMENT  05/2015   DES placed to the mid and distal LAD as well as OM2/notes 01/13/2016  . CORONARY ANGIOPLASTY WITH STENT PLACEMENT  ~ 2010  . CORONARY ARTERY BYPASS GRAFT  11/11/2015   CABG X2 LIMA-LAD and SVG-OM/notes 01/13/2016  . DILATION AND CURETTAGE OF UTERUS    . FEMORAL-POPLITEAL BYPASS GRAFT Left 2013   Archie Endo 01/13/2016  .  LAPAROSCOPIC GASTRIC BANDING  2007  . RENAL ARTERY STENT    . VAGINAL HYSTERECTOMY      OB History    No data available       Home Medications    Prior to Admission medications   Medication Sig Start Date End Date Taking? Authorizing Provider  alfuzosin (UROXATRAL) 10 MG 24 hr tablet Take 1 tablet (10 mg total) by mouth daily with breakfast. 03/30/16   Festus Aloe, MD  Alirocumab (PRALUENT) 75 MG/ML SOPN Inject 1 pen into the skin every 14 (fourteen) days. 05/06/16   Dorothy Spark, MD  aspirin 81 MG tablet Take 81 mg by mouth daily.    [provider]  Cholecalciferol (VITAMIN D3) 50000 units CAPS Take 1 capsule by mouth 2 (two) times a week.    [provider]  clopidogrel (PLAVIX) 75 MG tablet Take 1 tablet (75 mg total) by mouth daily. 06/03/16   Dorothy Spark, MD  diclofenac sodium (VOLTAREN) 1 % GEL Apply 2 g topically daily as needed for pain.    [provider]  fentaNYL (DURAGESIC - DOSED MCG/HR) 50 MCG/HR Place 50 mcg onto the skin every 3 (three) days.     [provider]  furosemide (LASIX) 20 MG tablet Take 1 tablet (20 mg total) by mouth daily as needed for fluid or edema. 05/10/16 08/27/16  Dorothy Spark, MD  gabapentin (NEURONTIN) 300 MG capsule Take 300-600 mg by mouth See admin instructions. Pt takes 651m in am, 3022mat bedtime    [provider]  hydrocortisone cream 1 % Apply topically as needed for itching. 01/15/16   SmArbutus LeasNP  HYDROmorphone (DILAUDID) 2 MG tablet Take 2 mg by mouth every 4 (four) hours as needed for severe pain.    [provider]  isosorbide mononitrate (IMDUR) 60 MG 24 hr tablet Take 1 tablet (60 mg total) by mouth daily. 05/10/16 08/27/16  NeDorothy SparkMD  lisinopril (PRINIVIL,ZESTRIL) 5 MG tablet Take 1 tablet (5 mg total) by mouth daily. 01/16/16   SmArbutus LeasNP  metFORMIN (GLUCOPHAGE) 500 MG tablet Take 1,000 mg by mouth 2 (two) times daily with a meal.      [provider]  metoprolol tartrate (LOPRESSOR) 25 MG tablet Take 0.5 tablets (12.5 mg total) by mouth 2 (two) times daily. 02/23/16 08/27/16  NeDorothy SparkMD  nitroGLYCERIN (NITROSTAT) 0.4 MG SL tablet Place 1 tablet (0.4 mg total) under the tongue every 5 (five) minutes as needed for chest pain. 01/15/16   SmArbutus LeasNP  Omega-3 Fatty Acids (FISH OIL) 1000 MG CAPS Take 2 capsules (2,000 mg total) by mouth daily. 05/06/16   NeDorothy SparkMD  ondansetron (ZOFRAN-ODT) 4 MG disintegrating tablet Take 4 mg by mouth every 8 (eight) hours as needed for nausea or vomiting.    [provider]  promethazine (PHENERGAN) 12.5 MG tablet Take 1-2 every 6 hours prn for nausea 04/06/16   JaOwens Loffler  P, MD  senna-docusate (SENOKOT-S) 8.6-50 MG tablet Take 2 tablets by mouth 2 (two) times daily as needed for constipation. 11/17/15 11/16/16  [provider]    Family History Family History  Problem Relation Age of Onset  . Diabetes Mother   . Heart failure Mother   . Prostate cancer Neg Hx   . Kidney cancer Neg Hx   . Bladder Cancer Neg Hx     Social History Social History  Substance Use Topics  . Smoking status: Never Smoker  . Smokeless tobacco: Never Used  . Alcohol use Yes     Comment: occasional wine     Allergies   Other; Statins; Amoxicillin; Codeine; Sulfa antibiotics; and Duloxetine   Review of Systems Review of Systems  All other systems reviewed and are negative.    Physical Exam Updated Vital Signs BP (!) 139/54   Pulse 64   Temp 98.8 F (37.1 C) (Oral)   Resp 17   Ht 1.575 m (5' 2" )   Wt 70.3 kg (155 lb)   SpO2 100%   BMI 28.35 kg/m   Physical Exam  Constitutional: She is oriented to person, place, and time. She appears well-developed and well-nourished.  Non-toxic appearance. No distress.  HENT:  Head: Normocephalic and atraumatic.  Eyes: Pupils are equal, round, and reactive to light. Conjunctivae, EOM and lids are normal.    Neck: Normal range of motion. Neck supple. No tracheal deviation present. No thyroid mass present.  Cardiovascular: Normal rate, regular rhythm and normal heart sounds.  Exam reveals no gallop.   No murmur heard. Pulmonary/Chest: Effort normal and breath sounds normal. No stridor. No respiratory distress. She has no decreased breath sounds. She has no wheezes. She has no rhonchi. She has no rales.  Abdominal: Soft. Normal appearance and bowel sounds are normal. She exhibits no distension. There is tenderness in the right upper quadrant and epigastric area. There is no rebound and no CVA tenderness.    Musculoskeletal: Normal range of motion. She exhibits no edema or tenderness.  Neurological: She is alert and oriented to person, place, and time. She has normal strength. No cranial nerve deficit or sensory deficit. GCS eye subscore is 4. GCS verbal subscore is 5. GCS motor subscore is 6.  Skin: Skin is warm and dry. No abrasion and no rash noted.  Psychiatric: She has a normal mood and affect. Her speech is normal and behavior is normal.  Nursing note and vitals reviewed.    ED Treatments / Results  Labs (all labs ordered are listed, but only abnormal results are displayed) Labs Reviewed  CBC - Abnormal; Notable for the following:       Result Value   Hemoglobin 11.1 (*)    HCT 34.7 (*)    All other components within normal limits  COMPREHENSIVE METABOLIC PANEL  LIPASE, BLOOD  I-STAT TROPONIN, ED    EKG  EKG Interpretation  Date/Time:  Sunday September 12 2016 12:12:01 EDT Ventricular Rate:  64 PR Interval:    QRS Duration: 109 QT Interval:  420 QTC Calculation: 434 R Axis:   3 Text Interpretation:  Sinus rhythm Anterior infarct, old No significant change since last tracing Confirmed by Lacretia Leigh ([id number]) on 09/12/2016 1:16:54 PM       Radiology No results found.  Procedures Procedures (including critical care time)  Medications Ordered in ED Medications   LORazepam (ATIVAN) injection 0.5 mg (not administered)  metoCLOPramide (REGLAN) injection 2.5 mg (not administered)  diphenhydrAMINE (BENADRYL) injection  12.5 mg (not administered)     Initial Impression / Assessment and Plan / ED Course  I have reviewed the triage vital signs and the nursing notes.  Pertinent labs & imaging results that were available during my care of the patient were reviewed by me and considered in my medical decision making (see chart for details).   patient here complaining of epigastric abdominal pain and does have history of prior gastric bypass surgery. Concern for intestinal obstruction. Low concern for ACS. Abdominal CT is without acute findings. She feels better after being medicated. Will discharge home and return precautions given  Final Clinical Impressions(s) / ED Diagnoses   Final diagnoses:  None    New Prescriptions New Prescriptions   No medications on file     Lacretia Leigh, MD 09/12/16 1448

## 2016-09-12 NOTE — ED Triage Notes (Signed)
Pt in from home via [facility] with c/o nausea x 48h, no vomiting. Per EMS, pt was getting dressed this morning and began having sharp central cp, with pain radiating between shoulders. 4mg  zofran given en route. Hx of MI, CABG and DM. CBG 236, alert, VSS

## 2016-09-17 ENCOUNTER — Emergency Department
Admission: EM | Admit: 2016-09-17 | Discharge: 2016-09-17 | Disposition: A | Discharge status: Home / Self Care | Payer: Medicare Other | Attending: Emergency Medicine | Admitting: Emergency Medicine

## 2016-09-17 ENCOUNTER — Emergency Department: Payer: Medicare Other

## 2016-09-17 ENCOUNTER — Encounter: Payer: Self-pay | Admitting: Emergency Medicine

## 2016-09-17 DIAGNOSIS — Z7982 Long term (current) use of aspirin: Secondary | ICD-10-CM | POA: Insufficient documentation

## 2016-09-17 DIAGNOSIS — R112 Nausea with vomiting, unspecified: Secondary | ICD-10-CM

## 2016-09-17 DIAGNOSIS — Z7984 Long term (current) use of oral hypoglycemic drugs: Secondary | ICD-10-CM | POA: Insufficient documentation

## 2016-09-17 DIAGNOSIS — I509 Heart failure, unspecified: Secondary | ICD-10-CM | POA: Insufficient documentation

## 2016-09-17 DIAGNOSIS — R1011 Right upper quadrant pain: Secondary | ICD-10-CM

## 2016-09-17 DIAGNOSIS — Z951 Presence of aortocoronary bypass graft: Secondary | ICD-10-CM | POA: Diagnosis not present

## 2016-09-17 DIAGNOSIS — I110 Hypertensive heart disease with heart failure: Secondary | ICD-10-CM | POA: Insufficient documentation

## 2016-09-17 DIAGNOSIS — R1084 Generalized abdominal pain: Secondary | ICD-10-CM | POA: Diagnosis not present

## 2016-09-17 DIAGNOSIS — I2510 Atherosclerotic heart disease of native coronary artery without angina pectoris: Secondary | ICD-10-CM | POA: Diagnosis not present

## 2016-09-17 DIAGNOSIS — Z79899 Other long term (current) drug therapy: Secondary | ICD-10-CM | POA: Insufficient documentation

## 2016-09-17 DIAGNOSIS — E119 Type 2 diabetes mellitus without complications: Secondary | ICD-10-CM | POA: Insufficient documentation

## 2016-09-17 DIAGNOSIS — R109 Unspecified abdominal pain: Secondary | ICD-10-CM

## 2016-09-17 LAB — URINALYSIS, COMPLETE (UACMP) WITH MICROSCOPIC
BACTERIA UA: NONE SEEN
Bilirubin Urine: NEGATIVE
Glucose, UA: NEGATIVE mg/dL
Hgb urine dipstick: NEGATIVE
Ketones, ur: 5 mg/dL — AB
Leukocytes, UA: NEGATIVE
NITRITE: NEGATIVE
PH: 5 (ref 5.0–8.0)
Protein, ur: NEGATIVE mg/dL
SPECIFIC GRAVITY, URINE: 1.011 (ref 1.005–1.030)

## 2016-09-17 LAB — CBC
HEMATOCRIT: 38.3 % (ref 35.0–47.0)
Hemoglobin: 12.8 g/dL (ref 12.0–16.0)
MCH: 27.3 pg (ref 26.0–34.0)
MCHC: 33.5 g/dL (ref 32.0–36.0)
MCV: 81.5 fL (ref 80.0–100.0)
Platelets: 235 10*3/uL (ref 150–440)
RBC: 4.7 MIL/uL (ref 3.80–5.20)
RDW: 16.1 % — AB (ref 11.5–14.5)
WBC: 9.2 10*3/uL (ref 3.6–11.0)

## 2016-09-17 LAB — COMPREHENSIVE METABOLIC PANEL
ALBUMIN: 4.1 g/dL (ref 3.5–5.0)
ALK PHOS: 47 U/L (ref 38–126)
ALT: 14 U/L (ref 14–54)
AST: 21 U/L (ref 15–41)
Anion gap: 11 (ref 5–15)
BILIRUBIN TOTAL: 0.9 mg/dL (ref 0.3–1.2)
BUN: 22 mg/dL — AB (ref 6–20)
CALCIUM: 9.3 mg/dL (ref 8.9–10.3)
CO2: 22 mmol/L (ref 22–32)
Chloride: 100 mmol/L — ABNORMAL LOW (ref 101–111)
Creatinine, Ser: 1.55 mg/dL — ABNORMAL HIGH (ref 0.44–1.00)
GFR calc Af Amer: 37 mL/min — ABNORMAL LOW (ref >60)
GFR calc non Af Amer: 32 mL/min — ABNORMAL LOW (ref >60)
GLUCOSE: 184 mg/dL — AB (ref 65–99)
POTASSIUM: 4.4 mmol/L (ref 3.5–5.1)
Sodium: 133 mmol/L — ABNORMAL LOW (ref 135–145)
TOTAL PROTEIN: 6.8 g/dL (ref 6.5–8.1)

## 2016-09-17 LAB — TROPONIN I: Troponin I: <0.03 ng/mL (ref <0.03)

## 2016-09-17 LAB — LIPASE, BLOOD: Lipase: 21 U/L (ref 11–51)

## 2016-09-17 LAB — LACTIC ACID, PLASMA: Lactic Acid, Venous: 1.3 mmol/L (ref 0.5–1.9)

## 2016-09-17 MED ORDER — ONDANSETRON HCL 4 MG/2ML IJ SOLN
4.0000 mg | Freq: Once | INTRAMUSCULAR | Status: AC | PRN
  Administered 2016-09-17: 4 mg via INTRAVENOUS
  Filled 2016-09-17: qty 2

## 2016-09-17 MED ORDER — METOCLOPRAMIDE HCL 10 MG PO TABS: 10.0000 mg | ORAL_TABLET | Freq: Three times a day (TID) | ORAL | 0 refills | Status: DC | PRN

## 2016-09-17 MED ORDER — FAMOTIDINE 20 MG PO TABS: 20.0000 mg | ORAL_TABLET | Freq: Two times a day (BID) | ORAL | 1 refills | Status: DC

## 2016-09-17 MED ORDER — SODIUM CHLORIDE 0.9 % IV BOLUS (SEPSIS)
1000.0000 mL | Freq: Once | INTRAVENOUS | Status: AC
  Administered 2016-09-17: 1000 mL via INTRAVENOUS

## 2016-09-17 MED ORDER — ONDANSETRON HCL 4 MG/2ML IJ SOLN
4.0000 mg | Freq: Once | INTRAMUSCULAR | Status: AC
  Administered 2016-09-17: 4 mg via INTRAVENOUS
  Filled 2016-09-17: qty 2

## 2016-09-17 MED ORDER — FAMOTIDINE IN NACL 20-0.9 MG/50ML-% IV SOLN
20.0000 mg | Freq: Once | INTRAVENOUS | Status: AC
  Administered 2016-09-17: 20 mg via INTRAVENOUS
  Filled 2016-09-17: qty 50

## 2016-09-17 NOTE — ED Provider Notes (Signed)
[facility] Emergency Department Provider Note  ____________________________________________  Time seen: Approximately 5:25 PM  I have reviewed the triage vital signs and the nursing notes.   HISTORY  Chief Complaint Nausea and Abdominal Pain   HPI [name] is a 76 y.o. female with a history of gastric lap band, kidney stones, diverticulitis, CHF, multiple myeloma, CAD, diabetes who presents for evaluation of abdominal pain, nausea and vomiting. Patient reports symptoms have been present for a week. She was seen 5 days ago at [facility] with labs and CT abdomen and pelvis with no etiology identified. She continues to have L sided dull intermittent abdominal pain, severe nausea, and vomiting. She has been unable to keep anything down for days. No diarrhea. Last BM 4 days ago. She feels her abdomen is distended. She is passing flatus. No dysuria, no hematuria, no fever. Has had chills. No CP or SOB   Past Medical History:  Diagnosis Date  . Anginal pain ([place])   . Anxiety   . Arthritis    "shoulders primarily" (01/13/2016)  . CHF (congestive heart failure) ([place])   . Chronic pain    from her multiple myeloma but says that her pain is usually in her legs/notes 01/13/2016  . High cholesterol   . History of blood transfusion    "numerous; related to procedures for my heart" (01/13/2016)  . Hypertension   . Ischemic cardiomyopathy    /nots 01/13/2016  . MI (myocardial infarction) ([place])    EKG on arrival 01/13/2016 showed NSR with evidence of old anterior and inferior infarcts  . MI (myocardial infarction) ([place]) 08/2015  . Multiple myeloma ([place]) dx'd 2015  . Nausea and vomiting 05/10/2016   3-4 month hx of nausea and vomiting  . PAD (peripheral artery disease) ([place])   . Stroke [facility])    "several in 1 year; ? year" (01/13/2016)  . Type II diabetes mellitus [facility])     Patient Active Problem List   Diagnosis Date Noted  . Lower extremity edema 05/10/2016  .  Ischemic cardiomyopathy 02/23/2016  . Chills 02/23/2016  . Weight loss 02/23/2016  . Unstable angina pectoris ([place]) 01/13/2016  . Chest pain   . PVD (peripheral vascular disease) ([place])   . Coronary artery disease involving coronary bypass graft of native heart with angina pectoris ([place])   . Peripheral arterial occlusive disease ([place]) 12/15/2015  . Acute blood loss anemia 11/13/2015  . Leukocytosis 11/13/2015  . Chronic pain 11/12/2015  . Acute postoperative pain 11/12/2015  . 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 Acute UTI 11/05/2015  . Edema of left lower extremity 06/16/2015  . Edema of upper extremity 06/16/2015  . Knee pain, left 06/14/2015  . AKI (acute kidney injury) ([place]) 06/11/2015  . Hyperkalemia 06/11/2015  . Hypomagnesemia 06/11/2015  . Acute leg pain, left 05/20/2015  . Multiple bruises 05/20/2015  . Recurrent urinary tract infection 03/26/2014  . Recurrent UTI 03/26/2014  . H/O gastric bypass 11/07/2013  . Peripheral neuropathy 10/10/2013  . Encounter for antineoplastic chemotherapy 10/10/2013  . Multiple myeloma ([place]) 07/16/2013  . Critical ischemia of lower extremity 05/15/2013  . Critical lower limb ischemia 05/15/2013  . Intermittent claudication ([place]) 07/27/2011  . Chronic coronary artery disease 04/26/2011  . Gastroesophageal reflux disease 04/26/2011  . Type 2 diabetes mellitus ([place]) 03/30/2011  . Hyperlipidemia, unspecified 03/30/2011  . Hypertension 03/30/2011    Past Surgical History:  Procedure Laterality Date  . CARDIAC CATHETERIZATION N/A 01/14/2016   Procedure: Left Heart  Cath and Cors/Grafts Angiography;  Surgeon: Burnell Blanks, MD;  Location: [place] [facility];  Service: Cardiovascular;  Laterality: N/A;  . CARPAL TUNNEL RELEASE Left   . CORONARY ANGIOPLASTY WITH STENT PLACEMENT  05/2015   DES placed to the mid and distal LAD as well as OM2/notes 01/13/2016  . CORONARY ANGIOPLASTY WITH STENT PLACEMENT  ~ 2010  . CORONARY ARTERY BYPASS GRAFT   11/11/2015   CABG X2 LIMA-LAD and SVG-OM/notes 01/13/2016  . DILATION AND CURETTAGE OF UTERUS    . FEMORAL-POPLITEAL BYPASS GRAFT Left 2013   Archie Endo 01/13/2016  . LAPAROSCOPIC GASTRIC BANDING  2007  . RENAL ARTERY STENT    . VAGINAL HYSTERECTOMY      Prior to Admission medications   Medication Sig Start Date End Date Taking? Authorizing Provider  alfuzosin (UROXATRAL) 10 MG 24 hr tablet Take 1 tablet (10 mg total) by mouth daily with breakfast. 03/30/16  Yes Festus Aloe, MD  Alirocumab (PRALUENT) 75 MG/ML SOPN Inject 1 pen into the skin every 14 (fourteen) days. 05/06/16  Yes Dorothy Spark, MD  aspirin 81 MG tablet Take 81 mg by mouth daily.   Yes [provider]  Cholecalciferol (VITAMIN D3) 50000 units CAPS Take 1 capsule by mouth 2 (two) times a week.   Yes [provider]  clopidogrel (PLAVIX) 75 MG tablet Take 1 tablet (75 mg total) by mouth daily. 06/03/16  Yes Dorothy Spark, MD  fentaNYL (DURAGESIC - DOSED MCG/HR) 50 MCG/HR Place 50 mcg onto the skin every 3 (three) days.    Yes [provider]  gabapentin (NEURONTIN) 300 MG capsule Take 300-600 mg by mouth See admin instructions. Pt takes '600mg'$  in am, '300mg'$  at bedtime   Yes [provider]  isosorbide mononitrate (IMDUR) 60 MG 24 hr tablet Take 1 tablet (60 mg total) by mouth daily. 05/10/16 09/17/16 Yes Dorothy Spark, MD  lisinopril (PRINIVIL,ZESTRIL) 5 MG tablet Take 1 tablet (5 mg total) by mouth daily. 01/16/16  Yes Arbutus Leas, NP  metFORMIN (GLUCOPHAGE) 500 MG tablet Take 1,000 mg by mouth 2 (two) times daily with a meal.    Yes [provider]  metoprolol tartrate (LOPRESSOR) 25 MG tablet Take 0.5 tablets (12.5 mg total) by mouth 2 (two) times daily. 02/23/16 09/17/16 Yes Dorothy Spark, MD  Omega-3 Fatty Acids (FISH OIL) 1000 MG CAPS Take 2 capsules (2,000 mg total) by mouth daily. 05/06/16  Yes Dorothy Spark, MD  diclofenac sodium (VOLTAREN) 1 % GEL Apply 2 g  topically daily as needed for pain.    [provider]  famotidine (PEPCID) 20 MG tablet Take 1 tablet (20 mg total) by mouth 2 (two) times daily. 09/17/16 09/17/17  Rudene Re, MD  furosemide (LASIX) 20 MG tablet Take 1 tablet (20 mg total) by mouth daily as needed for fluid or edema. 05/10/16 08/27/16  Dorothy Spark, MD  hydrocortisone cream 1 % Apply topically as needed for itching. 01/15/16   Arbutus Leas, NP  HYDROmorphone (DILAUDID) 2 MG tablet Take 2 mg by mouth every 4 (four) hours as needed for severe pain.    [provider]  metoCLOPramide (REGLAN) 10 MG tablet Take 1 tablet (10 mg total) by mouth every 8 (eight) hours as needed for nausea. 09/17/16 09/20/16  Rudene Re, MD  nitroGLYCERIN (NITROSTAT) 0.4 MG SL tablet Place 1 tablet (0.4 mg total) under the tongue every 5 (five) minutes as needed for chest pain. 01/15/16   Tamala Julian,  Dani Gobble, NP  ondansetron (ZOFRAN ODT) 8 MG disintegrating tablet Take 1 tablet (8 mg total) by mouth every 8 (eight) hours as needed for nausea or vomiting. 09/12/16   Lacretia Leigh, MD  ondansetron (ZOFRAN-ODT) 4 MG disintegrating tablet Take 4 mg by mouth every 8 (eight) hours as needed for nausea or vomiting.    [provider]  promethazine (PHENERGAN) 12.5 MG tablet Take 1-2 every 6 hours prn for nausea Patient not taking: Reported on 09/17/2016 04/06/16   Milus Banister, MD  senna-docusate (SENOKOT-S) 8.6-50 MG tablet Take 2 tablets by mouth 2 (two) times daily as needed for constipation. 11/17/15 11/16/16  [provider]    Allergies Other; Statins; Amoxicillin; Codeine; Sulfa antibiotics; and Duloxetine  Family History  Problem Relation Age of Onset  . Diabetes Mother   . Heart failure Mother   . Prostate cancer Neg Hx   . Kidney cancer Neg Hx   . Bladder Cancer Neg Hx     Social History Social History  Substance Use Topics  . Smoking status: Never Smoker  . Smokeless tobacco: Never Used  . Alcohol use  Yes     Comment: occasional wine    Review of Systems  Constitutional: Negative for fever. + chills Eyes: Negative for visual changes. ENT: Negative for sore throat. Neck: No neck pain  Cardiovascular: Negative for chest pain. Respiratory: Negative for shortness of breath. Gastrointestinal: + L sided abdominal pain, nausea, and vomiting. No diarrhea. Genitourinary: Negative for dysuria. Musculoskeletal: Negative for back pain. Skin: Negative for rash. Neurological: Negative for headaches, weakness or numbness. Psych: No SI or HI  ____________________________________________   PHYSICAL EXAM:  VITAL SIGNS: ED Triage Vitals  Enc Vitals Group     BP 09/17/16 1307 137/82     Pulse Rate 09/17/16 1307 84     Resp 09/17/16 1307 20     Temp 09/17/16 1307 97.9 F (36.6 C)     Temp Source 09/17/16 1307 Oral     SpO2 09/17/16 1307 100 %     Weight 09/17/16 1308 150 lb (68 kg)     Height 09/17/16 1308 _0  (1.575 m)     Head Circumference --      Peak Flow --      Pain Score 09/17/16 1307 7     Pain Loc --      Pain Edu? --      Excl. in [place]? --     Constitutional: Alert and oriented. Well appearing and in no apparent distress. HEENT:      Head: Normocephalic and atraumatic.         Eyes: Conjunctivae are normal. Sclera is non-icteric.       Mouth/Throat: Mucous membranes are dry.       Neck: Supple with no signs of meningismus. Cardiovascular: Regular rate and rhythm. No murmurs, gallops, or rubs. 2+ symmetrical distal pulses are present in all extremities. No JVD. Respiratory: Normal respiratory effort. Lungs are clear to auscultation bilaterally. No wheezes, crackles, or rhonchi.  Gastrointestinal: Soft, ttp over the RUQ, epigastrium and LUQ, and non distended with positive bowel sounds. No rebound or guarding. Genitourinary: No CVA tenderness. Musculoskeletal: Nontender with normal range of motion in all extremities. No edema, cyanosis, or erythema of  extremities. Neurologic: Normal speech and language. Face is symmetric. Moving all extremities. No gross focal neurologic deficits are appreciated. Skin: Skin is warm, dry and intact. No rash noted. Psychiatric: Mood and affect are normal. Speech and behavior  are normal.  ____________________________________________   LABS (all labs ordered are listed, but only abnormal results are displayed)  Labs Reviewed  COMPREHENSIVE METABOLIC PANEL - Abnormal; Notable for the following:       Result Value   Sodium 133 (*)    Chloride 100 (*)    Glucose, Bld 184 (*)    BUN 22 (*)    Creatinine, Ser 1.55 (*)    GFR calc non Af Amer 32 (*)    GFR calc Af Amer 37 (*)    All other components within normal limits  CBC - Abnormal; Notable for the following:    RDW 16.1 (*)    All other components within normal limits  URINALYSIS, COMPLETE (UACMP) WITH MICROSCOPIC - Abnormal; Notable for the following:    Color, Urine YELLOW (*)    APPearance CLEAR (*)    Ketones, ur 5 (*)    Squamous Epithelial / LPF 0-5 (*)    All other components within normal limits  LIPASE, BLOOD  LACTIC ACID, PLASMA  TROPONIN I   ____________________________________________  EKG  ED ECG REPORT I, Nita Sickle, the attending physician, personally viewed and interpreted this ECG.  Normal sinus rhythm, rate of 72, normal intervals, normal axis, no ST elevations or depressions, diffuse T-wave flattening.unchanged from prior from 5 days ago. ____________________________________________  RADIOLOGY  RUQ [place]: negative  ____________________________________________   PROCEDURES  Procedure(s) performed: None Procedures Critical Care performed:  None ____________________________________________   INITIAL IMPRESSION / ASSESSMENT AND PLAN / ED COURSE  76 y.o. female with a history of gastric lap band, kidney stones, diverticulitis, CHF, multiple myeloma, CAD, diabetes who presents for evaluation of abdominal  pain, nausea and vomiting x 1 week. Normal labs and CT a/p 5 days ago. Now with AKI from dehydration and inability to tolerate PO. Mesenteric ischemia considered since patient has significant atherosclerotic disease seen on CT however she denies that pain is worse with eating. Will get lactate and if elevate will consider CT angiogram. GB disease also considered since patient has ttp over the upper quadrants, CT with no evidence of cholelithiasis, normal LFT, Tbili, lipase and WBC. Patient had endoscopy 6 months ago with evidence of gastritis, will give IV pepcid, IVF, and zofran.    _________________________ 9:15 PM on 09/17/2016 -----------------------------------------  Patient feels markedly improved after IVF and zofran and pepcid. She is now tolerating PO. She will be dc home on reglan, pepcid and f/u with her GI doctor. I recommended that she returns to the ER if she is unable to tolerate PO at home, has new Or worsening abdominal pain, fever, or any other symptoms that were not present during today's evaluation. Otherwise recommended follow-up with her GI doctor. She is to be started on Pepcid for possible gastritis.  Pertinent labs & imaging results that were available during my care of the patient were reviewed by me and considered in my medical decision making (see chart for details).    ____________________________________________   FINAL CLINICAL IMPRESSION(S) / ED DIAGNOSES  Final diagnoses:  RUQ abdominal pain  Abdominal pain, unspecified abdominal location  Non-intractable vomiting with nausea, unspecified vomiting type      NEW MEDICATIONS STARTED DURING THIS VISIT:  New Prescriptions   FAMOTIDINE (PEPCID) 20 MG TABLET    Take 1 tablet (20 mg total) by mouth 2 (two) times daily.   METOCLOPRAMIDE (REGLAN) 10 MG TABLET    Take 1 tablet (10 mg total) by mouth every 8 (eight) hours as needed for nausea.  Note:  This document was prepared using Dragon voice recognition  software and may include unintentional dictation errors.    Rudene Re, MD 09/17/16 2117

## 2016-09-17 NOTE — ED Notes (Signed)
Report given [name]

## 2016-09-17 NOTE — ED Notes (Signed)

## 2016-09-17 NOTE — ED Triage Notes (Signed)
Pt in via POV with complaints of N/V, left side abdominal pain x one week.  Pt reports being seen at [name] on Sunday, exam unremarkable.  Pt reports taking zofran this morning, states, "it helped but it didn't last very long."  Pt dry heaving in triage room.  Vitals WDL.  NAD noted at this time.

## 2016-09-17 NOTE — ED Notes (Signed)
ED Provider at bedside. 

## 2016-09-17 NOTE — ED Notes (Signed)
Patient is resting comfortably at this time with no signs of distress present. VS stable. Given crackers and gingerale. Will continue to monitor.

## 2016-09-17 NOTE — Discharge Instructions (Signed)

## 2016-09-17 NOTE — ED Notes (Signed)
Pt tolerated PO challenge

## 2016-09-18 ENCOUNTER — Encounter: Payer: Self-pay | Admitting: Emergency Medicine

## 2016-09-18 ENCOUNTER — Inpatient Hospital Stay
Admission: EM | Admit: 2016-09-18 | Discharge: 2016-09-21 | DRG: 392 | Disposition: A | Discharge status: Home Health | Payer: Medicare Other | Attending: Specialist | Admitting: Specialist

## 2016-09-18 DIAGNOSIS — E7800 Pure hypercholesterolemia, unspecified: Secondary | ICD-10-CM | POA: Diagnosis present

## 2016-09-18 DIAGNOSIS — Z951 Presence of aortocoronary bypass graft: Secondary | ICD-10-CM

## 2016-09-18 DIAGNOSIS — N179 Acute kidney failure, unspecified: Secondary | ICD-10-CM | POA: Diagnosis present

## 2016-09-18 DIAGNOSIS — E1140 Type 2 diabetes mellitus with diabetic neuropathy, unspecified: Secondary | ICD-10-CM | POA: Diagnosis present

## 2016-09-18 DIAGNOSIS — I110 Hypertensive heart disease with heart failure: Secondary | ICD-10-CM | POA: Diagnosis present

## 2016-09-18 DIAGNOSIS — Z8711 Personal history of peptic ulcer disease: Secondary | ICD-10-CM

## 2016-09-18 DIAGNOSIS — I25119 Atherosclerotic heart disease of native coronary artery with unspecified angina pectoris: Secondary | ICD-10-CM | POA: Diagnosis present

## 2016-09-18 DIAGNOSIS — C9000 Multiple myeloma not having achieved remission: Secondary | ICD-10-CM | POA: Diagnosis present

## 2016-09-18 DIAGNOSIS — Z7982 Long term (current) use of aspirin: Secondary | ICD-10-CM

## 2016-09-18 DIAGNOSIS — E1151 Type 2 diabetes mellitus with diabetic peripheral angiopathy without gangrene: Secondary | ICD-10-CM | POA: Diagnosis present

## 2016-09-18 DIAGNOSIS — I509 Heart failure, unspecified: Secondary | ICD-10-CM | POA: Diagnosis present

## 2016-09-18 DIAGNOSIS — E119 Type 2 diabetes mellitus without complications: Secondary | ICD-10-CM

## 2016-09-18 DIAGNOSIS — Z9884 Bariatric surgery status: Secondary | ICD-10-CM

## 2016-09-18 DIAGNOSIS — Z8673 Personal history of transient ischemic attack (TIA), and cerebral infarction without residual deficits: Secondary | ICD-10-CM

## 2016-09-18 DIAGNOSIS — Z7984 Long term (current) use of oral hypoglycemic drugs: Secondary | ICD-10-CM

## 2016-09-18 DIAGNOSIS — I255 Ischemic cardiomyopathy: Secondary | ICD-10-CM | POA: Diagnosis present

## 2016-09-18 DIAGNOSIS — Z955 Presence of coronary angioplasty implant and graft: Secondary | ICD-10-CM

## 2016-09-18 DIAGNOSIS — R112 Nausea with vomiting, unspecified: Secondary | ICD-10-CM | POA: Diagnosis not present

## 2016-09-18 DIAGNOSIS — Z7902 Long term (current) use of antithrombotics/antiplatelets: Secondary | ICD-10-CM

## 2016-09-18 DIAGNOSIS — G8929 Other chronic pain: Secondary | ICD-10-CM | POA: Diagnosis present

## 2016-09-18 DIAGNOSIS — E8881 Metabolic syndrome: Secondary | ICD-10-CM | POA: Diagnosis present

## 2016-09-18 DIAGNOSIS — I252 Old myocardial infarction: Secondary | ICD-10-CM

## 2016-09-18 DIAGNOSIS — E860 Dehydration: Secondary | ICD-10-CM | POA: Diagnosis present

## 2016-09-18 DIAGNOSIS — Z79899 Other long term (current) drug therapy: Secondary | ICD-10-CM

## 2016-09-18 DIAGNOSIS — F419 Anxiety disorder, unspecified: Secondary | ICD-10-CM | POA: Diagnosis present

## 2016-09-18 HISTORY — DX: Atherosclerotic heart disease of native coronary artery without angina pectoris: I25.10

## 2016-09-18 LAB — CBC
HCT: 38.8 % (ref 35.0–47.0)
HEMOGLOBIN: 13 g/dL (ref 12.0–16.0)
MCH: 27.2 pg (ref 26.0–34.0)
MCHC: 33.5 g/dL (ref 32.0–36.0)
MCV: 81.4 fL (ref 80.0–100.0)
Platelets: 219 10*3/uL (ref 150–440)
RBC: 4.77 MIL/uL (ref 3.80–5.20)
RDW: 16.1 % — ABNORMAL HIGH (ref 11.5–14.5)
WBC: 9.1 10*3/uL (ref 3.6–11.0)

## 2016-09-18 LAB — GLUCOSE, CAPILLARY
GLUCOSE-CAPILLARY: 135 mg/dL — AB (ref 65–99)
Glucose-Capillary: 110 mg/dL — ABNORMAL HIGH (ref 65–99)

## 2016-09-18 LAB — COMPREHENSIVE METABOLIC PANEL
ALK PHOS: 50 U/L (ref 38–126)
ALT: 15 U/L (ref 14–54)
ANION GAP: 9 (ref 5–15)
AST: 21 U/L (ref 15–41)
Albumin: 4.3 g/dL (ref 3.5–5.0)
BUN: 21 mg/dL — ABNORMAL HIGH (ref 6–20)
CALCIUM: 9.4 mg/dL (ref 8.9–10.3)
CHLORIDE: 101 mmol/L (ref 101–111)
CO2: 25 mmol/L (ref 22–32)
Creatinine, Ser: 1.52 mg/dL — ABNORMAL HIGH (ref 0.44–1.00)
GFR calc non Af Amer: 32 mL/min — ABNORMAL LOW (ref >60)
GFR, EST AFRICAN AMERICAN: 38 mL/min — AB (ref >60)
Glucose, Bld: 207 mg/dL — ABNORMAL HIGH (ref 65–99)
POTASSIUM: 4.3 mmol/L (ref 3.5–5.1)
SODIUM: 135 mmol/L (ref 135–145)
Total Bilirubin: 0.8 mg/dL (ref 0.3–1.2)
Total Protein: 7.3 g/dL (ref 6.5–8.1)

## 2016-09-18 LAB — URINALYSIS, COMPLETE (UACMP) WITH MICROSCOPIC
Bacteria, UA: NONE SEEN
Bilirubin Urine: NEGATIVE
GLUCOSE, UA: 50 mg/dL — AB
HGB URINE DIPSTICK: NEGATIVE
Ketones, ur: 5 mg/dL — AB
Leukocytes, UA: NEGATIVE
Nitrite: NEGATIVE
PROTEIN: NEGATIVE mg/dL
RBC / HPF: NONE SEEN RBC/hpf (ref 0–5)
Specific Gravity, Urine: 1.013 (ref 1.005–1.030)
WBC UA: NONE SEEN WBC/hpf (ref 0–5)
pH: 5 (ref 5.0–8.0)

## 2016-09-18 LAB — LIPASE, BLOOD: LIPASE: 22 U/L (ref 11–51)

## 2016-09-18 LAB — HEMOGLOBIN A1C
HEMOGLOBIN A1C: 7.9 % — AB (ref 4.8–5.6)
MEAN PLASMA GLUCOSE: 180.03 mg/dL

## 2016-09-18 MED ORDER — INSULIN ASPART 100 UNIT/ML ~~LOC~~ SOLN
0.0000 [IU] | Freq: Three times a day (TID) | SUBCUTANEOUS | Status: DC
  Administered 2016-09-18: 1 [IU] via SUBCUTANEOUS
  Administered 2016-09-19: 2 [IU] via SUBCUTANEOUS
  Administered 2016-09-20 (×3): 1 [IU] via SUBCUTANEOUS
  Filled 2016-09-18 (×5): qty 1

## 2016-09-18 MED ORDER — ISOSORBIDE MONONITRATE ER 30 MG PO TB24
30.0000 mg | ORAL_TABLET | Freq: Every day | ORAL | Status: DC
  Administered 2016-09-18 – 2016-09-21 (×4): 30 mg via ORAL
  Filled 2016-09-18 (×4): qty 1

## 2016-09-18 MED ORDER — LISINOPRIL 5 MG PO TABS
5.0000 mg | ORAL_TABLET | Freq: Every day | ORAL | Status: DC
  Administered 2016-09-18 – 2016-09-21 (×4): 5 mg via ORAL
  Filled 2016-09-18 (×4): qty 1

## 2016-09-18 MED ORDER — ONDANSETRON HCL 4 MG/2ML IJ SOLN
4.0000 mg | Freq: Four times a day (QID) | INTRAMUSCULAR | Status: AC
  Administered 2016-09-18 – 2016-09-20 (×8): 4 mg via INTRAVENOUS
  Filled 2016-09-18 (×8): qty 2

## 2016-09-18 MED ORDER — CLOPIDOGREL BISULFATE 75 MG PO TABS
75.0000 mg | ORAL_TABLET | Freq: Every day | ORAL | Status: DC
  Administered 2016-09-18 – 2016-09-21 (×4): 75 mg via ORAL
  Filled 2016-09-18 (×4): qty 1

## 2016-09-18 MED ORDER — GABAPENTIN 300 MG PO CAPS
600.0000 mg | ORAL_CAPSULE | Freq: Every morning | ORAL | Status: DC
Start: 2016-09-19 — End: 2016-09-21
  Administered 2016-09-21: 600 mg via ORAL
  Filled 2016-09-18 (×3): qty 2

## 2016-09-18 MED ORDER — ONDANSETRON HCL 4 MG/2ML IJ SOLN
4.0000 mg | Freq: Once | INTRAMUSCULAR | Status: AC
  Administered 2016-09-18: 4 mg via INTRAVENOUS

## 2016-09-18 MED ORDER — GABAPENTIN 300 MG PO CAPS
300.0000 mg | ORAL_CAPSULE | Freq: Every evening | ORAL | Status: DC
  Administered 2016-09-20: 300 mg via ORAL
  Filled 2016-09-18: qty 1

## 2016-09-18 MED ORDER — FAMOTIDINE IN NACL 20-0.9 MG/50ML-% IV SOLN
20.0000 mg | Freq: Once | INTRAVENOUS | Status: AC
  Administered 2016-09-18: 20 mg via INTRAVENOUS

## 2016-09-18 MED ORDER — METOPROLOL TARTRATE 25 MG PO TABS
12.5000 mg | ORAL_TABLET | Freq: Two times a day (BID) | ORAL | Status: DC
  Administered 2016-09-18 – 2016-09-21 (×5): 12.5 mg via ORAL
  Filled 2016-09-18 (×5): qty 1

## 2016-09-18 MED ORDER — ACETAMINOPHEN 650 MG RE SUPP: 650.0000 mg | Freq: Four times a day (QID) | RECTAL | Status: DC | PRN

## 2016-09-18 MED ORDER — FAMOTIDINE IN NACL 20-0.9 MG/50ML-% IV SOLN
INTRAVENOUS | Status: AC
  Administered 2016-09-18: 20 mg via INTRAVENOUS
  Filled 2016-09-18: qty 50

## 2016-09-18 MED ORDER — SODIUM CHLORIDE 0.9 % IV BOLUS (SEPSIS)
1000.0000 mL | Freq: Once | INTRAVENOUS | Status: AC
  Administered 2016-09-18: 1000 mL via INTRAVENOUS

## 2016-09-18 MED ORDER — ONDANSETRON HCL 4 MG/2ML IJ SOLN
INTRAMUSCULAR | Status: AC
  Administered 2016-09-18: 4 mg via INTRAVENOUS
  Filled 2016-09-18: qty 2

## 2016-09-18 MED ORDER — VITAMIN D (ERGOCALCIFEROL) 1.25 MG (50000 UNIT) PO CAPS
50000.0000 [IU] | ORAL_CAPSULE | ORAL | Status: DC
  Administered 2016-09-20: 50000 [IU] via ORAL
  Filled 2016-09-18: qty 1

## 2016-09-18 MED ORDER — SODIUM CHLORIDE 0.9 % IV SOLN
INTRAVENOUS | Status: AC
  Administered 2016-09-18 – 2016-09-19 (×2): via INTRAVENOUS

## 2016-09-18 MED ORDER — FENTANYL 50 MCG/HR TD PT72: 50.0000 ug | MEDICATED_PATCH | TRANSDERMAL | Status: DC

## 2016-09-18 MED ORDER — NITROGLYCERIN 0.4 MG SL SUBL: 0.4000 mg | SUBLINGUAL_TABLET | SUBLINGUAL | Status: DC | PRN

## 2016-09-18 MED ORDER — ASPIRIN 81 MG PO CHEW
81.0000 mg | CHEWABLE_TABLET | Freq: Every day | ORAL | Status: DC
  Administered 2016-09-18 – 2016-09-21 (×4): 81 mg via ORAL
  Filled 2016-09-18 (×4): qty 1

## 2016-09-18 MED ORDER — ACETAMINOPHEN 325 MG PO TABS: 650.0000 mg | ORAL_TABLET | Freq: Four times a day (QID) | ORAL | Status: DC | PRN

## 2016-09-18 MED ORDER — PANTOPRAZOLE SODIUM 40 MG IV SOLR
40.0000 mg | Freq: Two times a day (BID) | INTRAVENOUS | Status: DC
  Administered 2016-09-18 – 2016-09-20 (×5): 40 mg via INTRAVENOUS
  Filled 2016-09-18 (×6): qty 40

## 2016-09-18 MED ORDER — OMEGA-3-ACID ETHYL ESTERS 1 G PO CAPS
1.0000 g | ORAL_CAPSULE | Freq: Every day | ORAL | Status: DC
  Administered 2016-09-19 – 2016-09-21 (×3): 1 g via ORAL
  Filled 2016-09-18 (×3): qty 1

## 2016-09-18 MED ORDER — INSULIN ASPART 100 UNIT/ML ~~LOC~~ SOLN
0.0000 [IU] | Freq: Every day | SUBCUTANEOUS | Status: DC
  Administered 2016-09-20: 2 [IU] via SUBCUTANEOUS
  Filled 2016-09-18: qty 1

## 2016-09-18 MED ORDER — PROMETHAZINE HCL 25 MG/ML IJ SOLN
12.5000 mg | Freq: Four times a day (QID) | INTRAMUSCULAR | Status: DC | PRN
  Administered 2016-09-19 – 2016-09-21 (×5): 12.5 mg via INTRAVENOUS
  Filled 2016-09-18 (×5): qty 1

## 2016-09-18 MED ORDER — ENOXAPARIN SODIUM 30 MG/0.3ML ~~LOC~~ SOLN
30.0000 mg | SUBCUTANEOUS | Status: DC
  Administered 2016-09-18: 30 mg via SUBCUTANEOUS
  Filled 2016-09-18: qty 0.3

## 2016-09-18 MED ORDER — SENNOSIDES-DOCUSATE SODIUM 8.6-50 MG PO TABS: 2.0000 | ORAL_TABLET | Freq: Every evening | ORAL | Status: DC | PRN

## 2016-09-18 MED ORDER — ONDANSETRON HCL 4 MG/2ML IJ SOLN
4.0000 mg | Freq: Once | INTRAMUSCULAR | Status: AC
  Administered 2016-09-18: 4 mg via INTRAVENOUS
  Filled 2016-09-18: qty 2

## 2016-09-18 NOTE — H&P (Signed)
[facility] NAME: [name]    MR#:  [id number]  DATE OF BIRTH:  17-Jan-1940  DATE OF ADMISSION:  09/18/2016  PRIMARY CARE PHYSICIAN: Lynnell Jude, MD   REQUESTING/REFERRING PHYSICIAN: Dr. Arta Silence  CHIEF COMPLAINT:   Chief Complaint  Patient presents with  . Nausea    HISTORY OF PRESENT ILLNESS:  [name]  is a 76 y.o. female with a known history of gastric lap band surgery, diverticulosis, CAD status post CABG in October 2017, non-insulin-dependent diabetes mellitus,peripheral arterial disease, chronic bone pain from her myeloma presents to hospital secondary to intractable nausea and vomiting for almost 9 days now. She has been doing well up until 10 days ago. She woke up with significant nausea and intractable creatinine the next day. Denies any recent travel or eating outside. No exposure to sick contacts. No recent change in medications. She went to [facility] emergency room and had a CT abdomen done which was negative to so was discharged on Pepcid and Reglan. Symptoms did not improve. Unable to have any bowel movement over the last several days since the pain started. Had almost minimal to no intake orally. Occasional abdominal pain in the upper abdomen. Denies any chest pain and no fevers or chills. Has chronic angina with the usual jaw pain which is chronic. Labs show mild dehydration. This is her third ER visit with similar  Symptoms in the last week. Being admitted under observation. Last EGD was almost a year ago and was noted to have a gastric ulcer. Denies any melena or bloody stools.  PAST MEDICAL HISTORY:   Past Medical History:  Diagnosis Date  . Anginal pain ([place])   . Anxiety   . Arthritis    "shoulders primarily" (01/13/2016)  . CAD (coronary artery disease)    s/p CABG in Oct 2018  . CHF (congestive heart failure) ([place])   . Chronic pain    from her multiple myeloma but says that her pain is  usually in her legs/notes 01/13/2016  . High cholesterol   . History of blood transfusion    "numerous; related to procedures for my heart" (01/13/2016)  . Hypertension   . Ischemic cardiomyopathy    /nots 01/13/2016  . MI (myocardial infarction) ([place])    EKG on arrival 01/13/2016 showed NSR with evidence of old anterior and inferior infarcts  . MI (myocardial infarction) ([place]) 08/2015  . Multiple myeloma ([place]) dx'd 2015   off chemo since 2016  . Nausea and vomiting 05/10/2016   3-4 month hx of nausea and vomiting  . PAD (peripheral artery disease) ([place])   . Stroke [facility])    "several in 1 year; ? year" (01/13/2016)  . Type II diabetes mellitus ([place])     PAST SURGICAL HISTORY:   Past Surgical History:  Procedure Laterality Date  . CARDIAC CATHETERIZATION N/A 01/14/2016   Procedure: Left Heart Cath and Cors/Grafts Angiography;  Surgeon: Burnell Blanks, MD;  Location: [facility];  Service: Cardiovascular;  Laterality: N/A;  . CARPAL TUNNEL RELEASE Left   . CORONARY ANGIOPLASTY WITH STENT PLACEMENT  05/2015   DES placed to the mid and distal LAD as well as OM2/notes 01/13/2016  . CORONARY ANGIOPLASTY WITH STENT PLACEMENT  ~ 2010  . CORONARY ARTERY BYPASS GRAFT  11/11/2015   CABG X2 LIMA-LAD and SVG-OM/notes 01/13/2016  . DILATION AND CURETTAGE OF UTERUS    . FEMORAL-POPLITEAL BYPASS GRAFT Left 2013   /  notes 01/13/2016  . LAPAROSCOPIC GASTRIC BANDING  2007  . RENAL ARTERY STENT    . VAGINAL HYSTERECTOMY      SOCIAL HISTORY:   Social History  Substance Use Topics  . Smoking status: Never Smoker  . Smokeless tobacco: Never Used  . Alcohol use Yes     Comment: occasional wine    FAMILY HISTORY:   Family History  Problem Relation Age of Onset  . Diabetes Mother   . Heart failure Mother   . Prostate cancer Neg Hx   . Kidney cancer Neg Hx   . Bladder Cancer Neg Hx     DRUG ALLERGIES:   Allergies  Allergen Reactions  . Other Swelling    Smoke--Head swelling  .  Statins Swelling  . Amoxicillin   . Codeine   . Sulfa Antibiotics   . Duloxetine Nausea Only    REVIEW OF SYSTEMS:   Review of Systems  Constitutional: Negative for chills, fever, malaise/fatigue and weight loss.  HENT: Negative for ear discharge, ear pain, hearing loss and nosebleeds.   Eyes: Negative for blurred vision, double vision and photophobia.  Respiratory: Negative for cough, hemoptysis and shortness of breath.   Cardiovascular: Negative for chest pain, palpitations, orthopnea and leg swelling.  Gastrointestinal: Positive for abdominal pain, nausea and vomiting. Negative for constipation, diarrhea, heartburn and melena.  Genitourinary: Negative for dysuria, frequency and urgency.  Musculoskeletal: Positive for joint pain and myalgias. Negative for back pain and neck pain.  Skin: Negative for rash.  Neurological: Positive for weakness. Negative for dizziness, sensory change, speech change, focal weakness and headaches.  Endo/Heme/Allergies: Does not bruise/bleed easily.  Psychiatric/Behavioral: Negative for depression.    MEDICATIONS AT HOME:   Prior to Admission medications   Medication Sig Start Date End Date Taking? Authorizing Provider  alfuzosin (UROXATRAL) 10 MG 24 hr tablet Take 1 tablet (10 mg total) by mouth daily with breakfast. 03/30/16  Yes Festus Aloe, MD  Alirocumab (PRALUENT) 75 MG/ML SOPN Inject 1 pen into the skin every 14 (fourteen) days. 05/06/16  Yes Dorothy Spark, MD  aspirin 81 MG tablet Take 81 mg by mouth daily.   Yes [provider]  Cholecalciferol (VITAMIN D3) 50000 units CAPS Take 1 capsule by mouth 2 (two) times a week.   Yes [provider]  clopidogrel (PLAVIX) 75 MG tablet Take 1 tablet (75 mg total) by mouth daily. 06/03/16  Yes Dorothy Spark, MD  diclofenac sodium (VOLTAREN) 1 % GEL Apply 2 g topically daily as needed for pain.   Yes [provider]  famotidine (PEPCID) 20 MG tablet Take 1 tablet (20  mg total) by mouth 2 (two) times daily. 09/17/16 09/17/17 Yes Veronese, [place], MD  fentaNYL (DURAGESIC - DOSED MCG/HR) 50 MCG/HR Place 50 mcg onto the skin every 3 (three) days.    Yes [provider]  gabapentin (NEURONTIN) 300 MG capsule Take 300-600 mg by mouth See admin instructions. Pt takes 647m in am, 307mat bedtime   Yes [provider]  hydrocortisone cream 1 % Apply topically as needed for itching. 01/15/16  Yes SmJettie Booze, NP  HYDROmorphone (DILAUDID) 2 MG tablet Take 2 mg by mouth every 4 (four) hours as needed for severe pain.   Yes [provider]  lisinopril (PRINIVIL,ZESTRIL) 5 MG tablet Take 1 tablet (5 mg total) by mouth daily. 01/16/16  Yes SmArbutus LeasNP  metFORMIN (GLUCOPHAGE) 500 MG tablet Take 1,000 mg by mouth 2 (two) times daily  with a meal.    Yes [provider]  metoCLOPramide (REGLAN) 10 MG tablet Take 1 tablet (10 mg total) by mouth every 8 (eight) hours as needed for nausea. 09/17/16 09/20/16 Yes Veronese, [place], MD  nitroGLYCERIN (NITROSTAT) 0.4 MG SL tablet Place 1 tablet (0.4 mg total) under the tongue every 5 (five) minutes as needed for chest pain. 01/15/16  Yes Arbutus Leas, NP  Omega-3 Fatty Acids (FISH OIL) 1000 MG CAPS Take 2 capsules (2,000 mg total) by mouth daily. 05/06/16  Yes Dorothy Spark, MD  ondansetron (ZOFRAN ODT) 8 MG disintegrating tablet Take 1 tablet (8 mg total) by mouth every 8 (eight) hours as needed for nausea or vomiting. 09/12/16  Yes Lacretia Leigh, MD  ondansetron (ZOFRAN-ODT) 4 MG disintegrating tablet Take 4 mg by mouth every 8 (eight) hours as needed for nausea or vomiting.   Yes [provider]  promethazine (PHENERGAN) 12.5 MG tablet Take 1-2 every 6 hours prn for nausea 04/06/16  Yes Milus Banister, MD  senna-docusate (SENOKOT-S) 8.6-50 MG tablet Take 2 tablets by mouth 2 (two) times daily as needed for constipation. 11/17/15 11/16/16 Yes [provider]  furosemide (LASIX) 20  MG tablet Take 1 tablet (20 mg total) by mouth daily as needed for fluid or edema. 05/10/16 08/27/16  Dorothy Spark, MD  isosorbide mononitrate (IMDUR) 60 MG 24 hr tablet Take 1 tablet (60 mg total) by mouth daily. 05/10/16 09/17/16  Dorothy Spark, MD  metoprolol tartrate (LOPRESSOR) 25 MG tablet Take 0.5 tablets (12.5 mg total) by mouth 2 (two) times daily. 02/23/16 09/17/16  Dorothy Spark, MD      VITAL SIGNS:  Blood pressure (!) 129/59, pulse 72, temperature 97.8 F (36.6 C), temperature source Oral, resp. rate 16, height _0  (1.575 m), weight 65.8 kg (145 lb), SpO2 98 %.  PHYSICAL EXAMINATION:   Physical Exam  GENERAL:  76 y.o.-year-old patient lying in the bed with no acute distress.  EYES: Pupils equal, round, reactive to light and accommodation. No scleral icterus. Extraocular muscles intact.  HEENT: Head atraumatic, normocephalic. Oropharynx and nasopharynx clear.  NECK:  Supple, no jugular venous distention. No thyroid enlargement, no tenderness.  LUNGS: Normal breath sounds bilaterally, no wheezing, rales,rhonchi or crepitation. No use of accessory muscles of respiration. Decreased bibasilar breath sounds CARDIOVASCULAR: S1, S2 normal. No murmurs, rubs, or gallops.  ABDOMEN: Soft, tender in the epigastric region, nondistended. Bowel sounds present. No organomegaly or mass.  EXTREMITIES: No pedal edema, cyanosis, or clubbing.  NEUROLOGIC: Cranial nerves II through XII are intact. Muscle strength 5/5 in all extremities. Sensation intact. Gait not checked.  PSYCHIATRIC: The patient is alert and oriented x 3.  SKIN: No obvious rash, lesion, or ulcer.   LABORATORY PANEL:   CBC  Recent Labs Lab 09/18/16 1014  WBC 9.1  HGB 13.0  HCT 38.8  PLT 219   ------------------------------------------------------------------------------------------------------------------  Chemistries   Recent Labs Lab 09/18/16 1014  NA 135  K 4.3  CL 101  CO2 25  GLUCOSE 207*  BUN  21*  CREATININE 1.52*  CALCIUM 9.4  AST 21  ALT 15  ALKPHOS 50  BILITOT 0.8   ------------------------------------------------------------------------------------------------------------------  Cardiac Enzymes  Recent Labs Lab 09/17/16 1307  TROPONINI <0.03   ------------------------------------------------------------------------------------------------------------------  RADIOLOGY:  US Abdomen Limited Ruq  Result Date: 09/17/2016 CLINICAL DATA:  Right upper quadrant abdominal pain. EXAM: ULTRASOUND ABDOMEN LIMITED RIGHT UPPER QUADRANT COMPARISON:  Abdominal CT 09/12/2016. Abdominal ultrasound 02/10/2016 FINDINGS: Gallbladder: Physiologically  distended. No gallstones or wall thickening visualized. No sonographic Murphy sign noted by sonographer. Common bile duct: Diameter: 5 mm, normal. Liver: No focal lesion identified. The tiny subcentimeter low-density lesion in the liver on prior CT is not well seen sonographically. Heterogeneous in parenchymal echogenicity. Portal vein is patent on color Doppler imaging with normal direction of blood flow towards the liver. Shadowing subcutaneous port seen in the anterior abdomen, post gastric banding. IMPRESSION: 1. No gallstones or gallbladder inflammation. No biliary dilatation. 2. Heterogeneous hepatic parenchyma without discrete lesion. Electronically Signed   By: Jeb Levering M.D.   On: 09/17/2016 19:50    EKG:   Orders placed or performed during the hospital encounter of 09/17/16  . ED EKG  . ED EKG  . EKG 12-Lead  . EKG 12-Lead    IMPRESSION AND PLAN:   [name]  is a 76 y.o. female with a known history of gastric lap band surgery, diverticulosis, CAD status post CABG in October 2017, non-insulin-dependent diabetes mellitus,peripheral arterial disease, chronic bone pain from her myeloma presents to hospital secondary to intractable nausea and vomiting for almost 9 days now.  #1 Intractable nausea and vomiting- CT of the  abdomen 5 days ago, ultrasound of the abdomen done yesterday in the ER. No acute causes noted. No obstruction noted. -Could be acute gastritis. Clear liquids for now, GI consult - IV protonix for h/o peptic ulcer disease - hold metformin (due to GI side effects) and add SSI  #2 acute renal failure- secondary to poor oral intake and intractable nausea vomiting. -Gentle hydration and monitor. Known history of myeloma, but baseline creatinine around 1  #3 CAD- s/p CABG in October 2017. On aspirin and Plavix. Repeat cardiac catheterization in January 2018 for chest pains revealing stable patent grafts. Medical management recommended. -Patient takes nitroglycerin as needed for angina like symptoms. -Continue aspirin and Plavix unless EGD needed. -otherwise stable cardiac-wise  #4 multiple myeloma- following at [facility] for the same, no chemo since 2016 monitor  #5 DVT prohylaxis- on lovenox     All the records are reviewed and case discussed with ED provider. Management plans discussed with the patient, family and they are in agreement.  CODE STATUS: Full Code  TOTAL TIME TAKING CARE OF THIS PATIENT: 50 minutes.    Gladstone Lighter M.D on 09/18/2016 at 1:52 PM  Between 7am to 6pm - Pager - [phone]  After 6pm go to [email or web address] - password [email or web address] [facility]  Office  [phone]  CC: Primary care physician; Lynnell Jude, MD

## 2016-09-18 NOTE — Progress Notes (Signed)
Pt admitted to room 142 from ED. Pt alert and oriented X4, no complaints of pain. Skin assessment completed with Estill Bamberg, RN. Pt has fentanyl patch on upper Left thigh, that's she applied yesterday 09/17/16. Sacral foam applied. Pt oriented to room and call bell. Bed in lowest position, call bell in reach and bed alarm on.

## 2016-09-18 NOTE — Progress Notes (Signed)
Pt refusing SCD's. Pt educated.

## 2016-09-18 NOTE — ED Provider Notes (Signed)
[facility] Emergency Department Provider Note ____________________________________________   First MD Initiated Contact with Patient 09/18/16 1102     (approximate)  I have reviewed the triage vital signs and the nursing notes.   HISTORY  Chief Complaint Nausea    HPI [name] is a 76 y.o. female Who presents with nausea and vomiting for approximately 9 days, persistent course, nonbloody and nonbilious, associated with upper and mid abdominal pain intermittently. Patient has been seen in the ER twice for this during the last week, and was last discharged yesterday. She took Pepcid and Zofran at home but states that she started vomiting again last night into this morning. Patient denies fever or chills, urinary symptoms, or diarrhea.  Past Medical History:  Diagnosis Date  . Anginal pain ([place])   . Anxiety   . Arthritis    "shoulders primarily" (01/13/2016)  . CAD (coronary artery disease)    s/p CABG in Oct 2018  . CHF (congestive heart failure) ([place])   . Chronic pain    from her multiple myeloma but says that her pain is usually in her legs/notes 01/13/2016  . High cholesterol   . History of blood transfusion    "numerous; related to procedures for my heart" (01/13/2016)  . Hypertension   . Ischemic cardiomyopathy    /nots 01/13/2016  . MI (myocardial infarction) ([place])    EKG on arrival 01/13/2016 showed NSR with evidence of old anterior and inferior infarcts  . MI (myocardial infarction) ([place]) 08/2015  . Multiple myeloma ([place]) dx'd 2015   off chemo since 2016  . Nausea and vomiting 05/10/2016   3-4 month hx of nausea and vomiting  . PAD (peripheral artery disease) ([place])   . Stroke [facility])    "several in 1 year; ? year" (01/13/2016)  . Type II diabetes mellitus [facility])     Patient Active Problem List   Diagnosis Date Noted  . Intractable nausea and vomiting 09/18/2016  . Lower extremity edema 05/10/2016  . Ischemic cardiomyopathy 02/23/2016    . Chills 02/23/2016  . Weight loss 02/23/2016  . Unstable angina pectoris ([place]) 01/13/2016  . Chest pain   . PVD (peripheral vascular disease) ([place])   . Coronary artery disease involving coronary bypass graft of native heart with angina pectoris ([place])   . Peripheral arterial occlusive disease ([place]) 12/15/2015  . Acute blood loss anemia 11/13/2015  . Leukocytosis 11/13/2015  . Chronic pain 11/12/2015  . Acute postoperative pain 11/12/2015  . 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 Acute UTI 11/05/2015  . Edema of left lower extremity 06/16/2015  . Edema of upper extremity 06/16/2015  . Knee pain, left 06/14/2015  . AKI (acute kidney injury) ([place]) 06/11/2015  . Hyperkalemia 06/11/2015  . Hypomagnesemia 06/11/2015  . Acute leg pain, left 05/20/2015  . Multiple bruises 05/20/2015  . Recurrent urinary tract infection 03/26/2014  . Recurrent UTI 03/26/2014  . H/O gastric bypass 11/07/2013  . Peripheral neuropathy 10/10/2013  . Encounter for antineoplastic chemotherapy 10/10/2013  . Multiple myeloma ([place]) 07/16/2013  . Critical ischemia of lower extremity 05/15/2013  . Critical lower limb ischemia 05/15/2013  . Intermittent claudication ([place]) 07/27/2011  . Chronic coronary artery disease 04/26/2011  . Gastroesophageal reflux disease 04/26/2011  . Type 2 diabetes mellitus ([place]) 03/30/2011  . Hyperlipidemia, unspecified 03/30/2011  . Hypertension 03/30/2011    Past Surgical History:  Procedure Laterality Date  . CARDIAC CATHETERIZATION N/A 01/14/2016   Procedure: Left Heart Cath and Cors/Grafts Angiography;  Surgeon: Burnell Blanks, MD;  Location: [facility];  Service: Cardiovascular;  Laterality: N/A;  . CARPAL TUNNEL RELEASE Left   . CORONARY ANGIOPLASTY WITH STENT PLACEMENT  05/2015   DES placed to the mid and distal LAD as well as OM2/notes 01/13/2016  . CORONARY ANGIOPLASTY WITH STENT PLACEMENT  ~ 2010  . CORONARY ARTERY BYPASS GRAFT  11/11/2015   CABG X2 LIMA-LAD and  SVG-OM/notes 01/13/2016  . DILATION AND CURETTAGE OF UTERUS    . FEMORAL-POPLITEAL BYPASS GRAFT Left 2013   Archie Endo 01/13/2016  . LAPAROSCOPIC GASTRIC BANDING  2007  . RENAL ARTERY STENT    . VAGINAL HYSTERECTOMY      Prior to Admission medications   Medication Sig Start Date End Date Taking? Authorizing Provider  alfuzosin (UROXATRAL) 10 MG 24 hr tablet Take 1 tablet (10 mg total) by mouth daily with breakfast. 03/30/16  Yes Festus Aloe, MD  Alirocumab (PRALUENT) 75 MG/ML SOPN Inject 1 pen into the skin every 14 (fourteen) days. 05/06/16  Yes Dorothy Spark, MD  aspirin 81 MG tablet Take 81 mg by mouth daily.   Yes [provider]  Cholecalciferol (VITAMIN D3) 50000 units CAPS Take 1 capsule by mouth 2 (two) times a week.   Yes [provider]  clopidogrel (PLAVIX) 75 MG tablet Take 1 tablet (75 mg total) by mouth daily. 06/03/16  Yes Dorothy Spark, MD  diclofenac sodium (VOLTAREN) 1 % GEL Apply 2 g topically daily as needed for pain.   Yes [provider]  famotidine (PEPCID) 20 MG tablet Take 1 tablet (20 mg total) by mouth 2 (two) times daily. 09/17/16 09/17/17 Yes Veronese, [place], MD  fentaNYL (DURAGESIC - DOSED MCG/HR) 50 MCG/HR Place 50 mcg onto the skin every 3 (three) days.    Yes [provider]  gabapentin (NEURONTIN) 300 MG capsule Take 300-600 mg by mouth See admin instructions. Pt takes 671m in am, 3038mat bedtime   Yes [provider]  hydrocortisone cream 1 % Apply topically as needed for itching. 01/15/16  Yes SmJettie Booze, NP  HYDROmorphone (DILAUDID) 2 MG tablet Take 2 mg by mouth every 4 (four) hours as needed for severe pain.   Yes [provider]  lisinopril (PRINIVIL,ZESTRIL) 5 MG tablet Take 1 tablet (5 mg total) by mouth daily. 01/16/16  Yes SmArbutus LeasNP  metFORMIN (GLUCOPHAGE) 500 MG tablet Take 1,000 mg by mouth 2 (two) times daily with a meal.    Yes [provider]  metoCLOPramide (REGLAN) 10  MG tablet Take 1 tablet (10 mg total) by mouth every 8 (eight) hours as needed for nausea. 09/17/16 09/20/16 Yes Veronese, Ca[place]entuckyMD  nitroGLYCERIN (NITROSTAT) 0.4 MG SL tablet Place 1 tablet (0.4 mg total) under the tongue every 5 (five) minutes as needed for chest pain. 01/15/16  Yes SmArbutus LeasNP  Omega-3 Fatty Acids (FISH OIL) 1000 MG CAPS Take 2 capsules (2,000 mg total) by mouth daily. 05/06/16  Yes NeDorothy SparkMD  promethazine (PHENERGAN) 12.5 MG tablet Take 1-2 every 6 hours prn for nausea 04/06/16  Yes JaMilus BanisterMD  senna-docusate (SENOKOT-S) 8.6-50 MG tablet Take 2 tablets by mouth 2 (two) times daily as needed for constipation. 11/17/15 11/16/16 Yes [provider]  furosemide (LASIX) 20 MG tablet Take 1 tablet (20 mg total) by mouth daily as needed for fluid or edema. 05/10/16 08/27/16  NeDorothy SparkMD  isosorbide mononitrate (IMDUR) 60 MG 24  hr tablet Take 1 tablet (60 mg total) by mouth daily. 05/10/16 09/17/16  Dorothy Spark, MD  metoprolol tartrate (LOPRESSOR) 25 MG tablet Take 0.5 tablets (12.5 mg total) by mouth 2 (two) times daily. 02/23/16 09/17/16  Dorothy Spark, MD  ondansetron (ZOFRAN ODT) 8 MG disintegrating tablet Take 1 tablet (8 mg total) by mouth every 8 (eight) hours as needed for nausea or vomiting. Patient not taking: Reported on 09/18/2016 09/12/16   Lacretia Leigh, MD  ondansetron (ZOFRAN-ODT) 4 MG disintegrating tablet Take 4 mg by mouth every 8 (eight) hours as needed for nausea or vomiting.    [provider]    Allergies Other; Statins; Amoxicillin; Codeine; Sulfa antibiotics; and Duloxetine  Family History  Problem Relation Age of Onset  . Diabetes Mother   . Heart failure Mother   . Prostate cancer Neg Hx   . Kidney cancer Neg Hx   . Bladder Cancer Neg Hx     Social History Social History  Substance Use Topics  . Smoking status: Never Smoker  . Smokeless tobacco: Never Used  . Alcohol use Yes     Comment:  occasional wine    Review of Systems  Constitutional: No fever/chills Eyes: No visual changes. ENT: No sore throat. Cardiovascular: Denies chest pain. Respiratory: Denies shortness of breath. Gastrointestinal: positive for nausea and vomiting Genitourinary: Negative for dysuria.  Musculoskeletal: Negative for back pain. Skin: Negative for rash. Neurological: Negative for headaches, focal weakness or numbness.   ____________________________________________   PHYSICAL EXAM:  VITAL SIGNS: ED Triage Vitals  Enc Vitals Group     BP 09/18/16 1005 (!) 106/44     Pulse Rate 09/18/16 1005 81     Resp 09/18/16 1005 13     Temp 09/18/16 1005 97.8 F (36.6 C)     Temp Source 09/18/16 1005 Oral     SpO2 09/18/16 1005 100 %     Weight 09/18/16 1006 145 lb (65.8 kg)     Height 09/18/16 1006 5' 2"  (1.575 m)     Head Circumference --      Peak Flow --      Pain Score 09/18/16 1013 8     Pain Loc --      Pain Edu? --      Excl. in [place]? --     Constitutional: Alert and oriented. Well appearing and in no acute distress. Eyes: Conjunctivae are normal.  Head: Atraumatic. Nose: No congestion/rhinnorhea. Mouth/Throat: Mucous membranes are moist.   Neck: Normal range of motion.  Cardiovascular: Normal rate, regular rhythm. Grossly normal heart sounds.  Good peripheral circulation. Respiratory: Normal respiratory effort.  No retractions. Lungs CTAB. Gastrointestinal: Soft, mild discomfort to deep palpation diffusely but no focal tenderness. No distention.  Genitourinary: No CVA tenderness. Musculoskeletal: No lower extremity edema.  Extremities warm and well perfused.  Neurologic:  Normal speech and language. No gross focal neurologic deficits are appreciated.  Skin:  Skin is warm and dry. No rash noted. Psychiatric: Mood and affect are normal. Speech and behavior are normal.  ____________________________________________   LABS (all labs ordered are listed, but only abnormal results  are displayed)  Labs Reviewed  COMPREHENSIVE METABOLIC PANEL - Abnormal; Notable for the following:       Result Value   Glucose, Bld 207 (*)    BUN 21 (*)    Creatinine, Ser 1.52 (*)    GFR calc non Af Amer 32 (*)    GFR calc Af Amer 38 (*)  All other components within normal limits  CBC - Abnormal; Notable for the following:    RDW 16.1 (*)    All other components within normal limits  URINALYSIS, COMPLETE (UACMP) WITH MICROSCOPIC - Abnormal; Notable for the following:    Color, Urine YELLOW (*)    APPearance CLEAR (*)    Glucose, UA 50 (*)    Ketones, ur 5 (*)    Squamous Epithelial / LPF 0-5 (*)    All other components within normal limits  LIPASE, BLOOD  HEMOGLOBIN A1C   ____________________________________________  EKG   ____________________________________________  RADIOLOGY    ____________________________________________   PROCEDURES  Procedure(s) performed: No    Critical Care performed: No ____________________________________________   INITIAL IMPRESSION / ASSESSMENT AND PLAN / ED COURSE  Pertinent labs & imaging results that were available during my care of the patient were reviewed by me and considered in my medical decision making (see chart for details).  76 year old female with a hx of gastric lap band, PUD, kidney stones, diverticulitis, CHF, multiple myeloma, CAD, diabetes presents with persistent nausea and vomiting for approximately 9 days associated with some intermittent diffuse abdominal pain.  patient was seen at: 6 days ago and had CT abdomen which was negative, and was seen in the ED here yesterday for same at which time she had right upper quadrant ultrasound which was also negative. Patient had improved symptoms yesterday after Zofran and fluids but states after she went home, the nausea and vomiting returned and she feels unwell. Vital signs are normal and exam is otherwise unremarkable with no focal abdominal tenderness.  Overall  differential includes gastritis, PUD, gastroenteritis, cyclical vomiting, esophagitis.  No evidence of colitis, appendicitis, or other focal intra-abdominal infection.  No indication for imaging on this visit given that the symptoms are not worsening, and patient has already been imaged twice this week. Plan for symptomatic treatment with fluids, Zofran, and Pepcid, repeat labs, and likely will need admission for IV hydration and persistent symptomatic treatment if she is unable to tolerate PO.  Consider imaging if significant concerning lab abnormalities.     ----------------------------------------- 12:39 PM on 09/18/2016 -----------------------------------------  Lab workup unremarkable. Will admit. Patient signed out to hospitalist Dr. Tressia Miners.    ____________________________________________   FINAL CLINICAL IMPRESSION(S) / ED DIAGNOSES  Final diagnoses:  Nausea and vomiting, intractability of vomiting not specified, unspecified vomiting type      NEW MEDICATIONS STARTED DURING THIS VISIT:  Current Discharge Medication List       Note:  This document was prepared using Dragon voice recognition software and may include unintentional dictation errors.    Arta Silence, MD 09/18/16 [phone]

## 2016-09-18 NOTE — ED Notes (Signed)
Pt transported to room 142 

## 2016-09-18 NOTE — ED Triage Notes (Signed)
Pt to ed with c/o nausea and vomiting.  Was seen here yesterday for same, was told to come back if nausea and vomiting persisted.  Pt reports vomiting x several times through the night,  States started with severe nausea again about 3 hours pta.

## 2016-09-19 DIAGNOSIS — R112 Nausea with vomiting, unspecified: Secondary | ICD-10-CM | POA: Diagnosis not present

## 2016-09-19 LAB — COMPREHENSIVE METABOLIC PANEL
ALT: 10 U/L — AB (ref 14–54)
AST: 17 U/L (ref 15–41)
Albumin: 3.4 g/dL — ABNORMAL LOW (ref 3.5–5.0)
Alkaline Phosphatase: 36 U/L — ABNORMAL LOW (ref 38–126)
Anion gap: 6 (ref 5–15)
BILIRUBIN TOTAL: 0.9 mg/dL (ref 0.3–1.2)
BUN: 15 mg/dL (ref 6–20)
CALCIUM: 8.3 mg/dL — AB (ref 8.9–10.3)
CHLORIDE: 108 mmol/L (ref 101–111)
CO2: 23 mmol/L (ref 22–32)
CREATININE: 1.34 mg/dL — AB (ref 0.44–1.00)
GFR, EST AFRICAN AMERICAN: 44 mL/min — AB (ref >60)
GFR, EST NON AFRICAN AMERICAN: 38 mL/min — AB (ref >60)
Glucose, Bld: 113 mg/dL — ABNORMAL HIGH (ref 65–99)
Potassium: 3.7 mmol/L (ref 3.5–5.1)
Sodium: 137 mmol/L (ref 135–145)
TOTAL PROTEIN: 5.5 g/dL — AB (ref 6.5–8.1)

## 2016-09-19 LAB — CBC
HEMATOCRIT: 32 % — AB (ref 35.0–47.0)
Hemoglobin: 10.9 g/dL — ABNORMAL LOW (ref 12.0–16.0)
MCH: 28 pg (ref 26.0–34.0)
MCHC: 34.1 g/dL (ref 32.0–36.0)
MCV: 82.1 fL (ref 80.0–100.0)
PLATELETS: 176 10*3/uL (ref 150–440)
RBC: 3.9 MIL/uL (ref 3.80–5.20)
RDW: 16.2 % — ABNORMAL HIGH (ref 11.5–14.5)
WBC: 10.3 10*3/uL (ref 3.6–11.0)

## 2016-09-19 LAB — GLUCOSE, CAPILLARY
GLUCOSE-CAPILLARY: 120 mg/dL — AB (ref 65–99)
GLUCOSE-CAPILLARY: 99 mg/dL (ref 65–99)
Glucose-Capillary: 131 mg/dL — ABNORMAL HIGH (ref 65–99)
Glucose-Capillary: 176 mg/dL — ABNORMAL HIGH (ref 65–99)

## 2016-09-19 MED ORDER — METOCLOPRAMIDE HCL 5 MG/ML IJ SOLN
5.0000 mg | Freq: Four times a day (QID) | INTRAMUSCULAR | Status: DC
  Administered 2016-09-19 – 2016-09-20 (×4): 5 mg via INTRAVENOUS
  Filled 2016-09-19 (×4): qty 2

## 2016-09-19 MED ORDER — ENOXAPARIN SODIUM 40 MG/0.4ML ~~LOC~~ SOLN
40.0000 mg | SUBCUTANEOUS | Status: DC
  Administered 2016-09-19 – 2016-09-20 (×2): 40 mg via SUBCUTANEOUS
  Filled 2016-09-19 (×2): qty 0.4

## 2016-09-19 NOTE — Plan of Care (Signed)
Problem: Acute Rehab PT Goals(only PT should resolve) Goal: Pt Will Go Supine/Side To Sit Outcome: Progressing Patient will be able to perform bed mobility Independently to be able to transfer out of bed at home without use of guard rails.  Goal: Patient Will Transfer Sit To/From Stand Outcome: Progressing Patient will be able to perform sit to stand transfers with Modified [place] with use of FWW to be able to stand and prepare for ambulation Goal: Pt Will Ambulate Outcome: Progressing Patient will be able to ambulate with FWW with modI to be able to amb to the restroom without assistance.

## 2016-09-19 NOTE — Progress Notes (Signed)
[place] [facility] [place] NAME: [name]    MR#:  [id number]  DATE OF BIRTH:  1940-10-31  SUBJECTIVE:   Patient here due to intractable nausea and vomiting and continues to have some nausea. No vomiting, no abdominal pain, no fever, chills.  REVIEW OF SYSTEMS:    Review of Systems  Constitutional: Negative for chills and fever.  HENT: Negative for congestion and tinnitus.   Eyes: Negative for blurred vision and double vision.  Respiratory: Negative for cough, shortness of breath and wheezing.   Cardiovascular: Negative for chest pain, orthopnea and PND.  Gastrointestinal: Negative for abdominal pain, diarrhea, nausea and vomiting.  Genitourinary: Negative for dysuria and hematuria.  Neurological: Negative for dizziness, sensory change and focal weakness.  All other systems reviewed and are negative.   Nutrition: Clear liquid Tolerating Diet: Yes Tolerating PT: Await Eval.    DRUG ALLERGIES:   Allergies  Allergen Reactions  . Other Swelling    Smoke--Head swelling  . Statins Swelling  . Amoxicillin   . Codeine   . Sulfa Antibiotics   . Duloxetine Nausea Only    VITALS:  Blood pressure (!) 152/52, pulse 68, temperature 98.5 F (36.9 C), temperature source Oral, resp. rate 16, height 5\' 2"  (1.575 m), weight 65.8 kg (145 lb), SpO2 98 %.  PHYSICAL EXAMINATION:   Physical Exam  GENERAL:  76 y.o.-year-old obese patient lying in bed in no acute distress.  EYES: Pupils equal, round, reactive to light and accommodation. No scleral icterus. Extraocular muscles intact.  HEENT: Head atraumatic, normocephalic. Oropharynx and nasopharynx clear.  NECK:  Supple, no jugular venous distention. No thyroid enlargement, no tenderness.  LUNGS: Normal breath sounds bilaterally, no wheezing, rales, rhonchi. No use of accessory muscles of respiration.  CARDIOVASCULAR: S1, S2 normal. No murmurs, rubs, or gallops.  ABDOMEN: Soft, nontender,  nondistended. Bowel sounds present. No organomegaly or mass.  EXTREMITIES: No cyanosis, clubbing or edema b/l.    NEUROLOGIC: Cranial nerves II through XII are intact. No focal Motor or sensory deficits b/l.   PSYCHIATRIC: The patient is alert and oriented x 3.  SKIN: No obvious rash, lesion, or ulcer.    LABORATORY PANEL:   CBC  Recent Labs Lab 09/19/16 0318  WBC 10.3  HGB 10.9*  HCT 32.0*  PLT 176   ------------------------------------------------------------------------------------------------------------------  Chemistries   Recent Labs Lab 09/19/16 0318  NA 137  K 3.7  CL 108  CO2 23  GLUCOSE 113*  BUN 15  CREATININE 1.34*  CALCIUM 8.3*  AST 17  ALT 10*  ALKPHOS 36*  BILITOT 0.9   ------------------------------------------------------------------------------------------------------------------  Cardiac Enzymes  Recent Labs Lab 09/17/16 1307  TROPONINI <0.03   ------------------------------------------------------------------------------------------------------------------  RADIOLOGY:  US Abdomen Limited Ruq  Result Date: 09/17/2016 CLINICAL DATA:  Right upper quadrant abdominal pain. EXAM: ULTRASOUND ABDOMEN LIMITED RIGHT UPPER QUADRANT COMPARISON:  Abdominal CT 09/12/2016. Abdominal ultrasound 02/10/2016 FINDINGS: Gallbladder: Physiologically distended. No gallstones or wall thickening visualized. No sonographic Murphy sign noted by sonographer. Common bile duct: Diameter: 5 mm, normal. Liver: No focal lesion identified. The tiny subcentimeter low-density lesion in the liver on prior CT is not well seen sonographically. Heterogeneous in parenchymal echogenicity. Portal vein is patent on color Doppler imaging with normal direction of blood flow towards the liver. Shadowing subcutaneous port seen in the anterior abdomen, post gastric banding. IMPRESSION: 1. No gallstones or gallbladder inflammation. No biliary dilatation. 2. Heterogeneous hepatic parenchyma  without discrete lesion. Electronically Signed   By:  Jeb Levering M.D.   On: 09/17/2016 19:50     ASSESSMENT AND PLAN:   76 year old female with past medical history of Type II DM w/out complication, peripheral vascular disease, history of previous MI, ischemic cardiac myopathy, hypertension, CHF who presents to the hospital due to intractable nausea vomiting.  1. Intractable N/V - etiology unclear presently CT abdomen/RUQ [place] (-) for acute pathology.  - still having some nausea but no vomiting.  Will cont. Supportive care w/ IV fluids, anti-emetics.  - will start on scheduled reglan for possible Gastroparesis.  - await GI input.   2. DM Type II w/out complication - continue sliding scale insulin.  3. Diabetic neuropathy-continue gabapentin  4. Essential hypertension-continue metoprolol, lisinopril, Imdur.  5.chronic pain-continue fentanyl patch.    All the records are reviewed and case discussed with Care Management/Social Worker. Management plans discussed with the patient, family and they are in agreement.  CODE STATUS: Full code  DVT Prophylaxis: Lovenox  TOTAL TIME TAKING CARE OF THIS PATIENT: 30 minutes.   POSSIBLE D/C IN 1-2 DAYS, DEPENDING ON CLINICAL CONDITION.   Henreitta Leber M.D on 09/19/2016 at 1:38 PM  Between 7am to 6pm - Pager - [phone]  After 6pm go to [email or web address] - Proofreader  [facility]  Office  [phone]  CC: Primary care physician; Lynnell Jude, MD

## 2016-09-19 NOTE — Consult Note (Signed)
Cephas Darby, MD [address]  [place]  [place], [place] [address]  Main: [phone]  Fax: [phone] Pager: [phone]   Consultation  Referring Provider:     No ref. provider found Primary Care Physician:  Lynnell Jude, MD Primary Gastroenterologist:  Dr Owens Loffler        Reason for Consultation:     Intractable nausea and vomiting  Date of Admission:  09/18/2016 Date of Consultation:  09/19/2016         HPI:   [name] is a 76 y.o. female with multiple comorbidities as mentioned below and history of lap band surgery in 2 in 2007.  She presented to the ER on 09/12/2016 with nausea for 2 days but no vomiting. She had CT A/P at the time and it did not show any acute findings. She was discharged from the ER with Zofran. She returned to ER with nausea and vomiting, admitted for further management. She has been nauseous and dry heaving past 10days. She also has LLQ pain. She denies any new medication, did not eat anything different, no sick contacts. She has been taking zofran 75m every 8hrs. She developed mild rise in creatinine compared to her baseline secondary to poor by mouth intake. She started on clear liquids, Reglan, Phenergan, Zofran, Protonix. She didn't do well with CLD last night. Today she did ok with CLD for lunch. She has early satiety which is typical of her. She generally eats small, frequent portions at home. She denies heart burn. She denies f/v/diarrhea/blood in stool.   She has been having chronic nausea and epigastric pain radiating to the back since CABG in 10/2015. He also had weight loss about 30-40 pounds at the time. [facility] and [facility] EGDin 03/2016, showed mild gastritis, small erosion with oozing, treated with placement of Hemoclip  and pathology was negative for H. pylori infection. She also had a barium study at the time and revealed patent gastric lumen at the  site of lap band placement.  GI Procedures: EGD 03/2016 mild gastritis, nonbleeding duodenal diverticulum, normal esophagus, small erosion with oozing at the lap band site, treated with placement of Hemoclip  and pathology was negative for H. pylori infection.  Past Medical History:  Diagnosis Date  . [place]inal pain ([place]   . Anxiety   . Arthritis    "shoulders primarily" (01/13/2016)  . CAD (coronary artery disease)    s/p CABG in Oct 2018  . CHF (congestive heart failure) ([facility]   . Chronic pain    from her multiple myeloma but says that her pain is usually in her legs/notes 01/13/2016  . High cholesterol   . History of blood transfusion    "numerous; related to procedures for my heart" (01/13/2016)  . Hypertension   . Ischemic cardiomyopathy    /nots 01/13/2016  . MI (myocardial infarction) ([facility]    EKG on arrival 01/13/2016 showed NSR with evidence of old anterior and inferior infarcts  . MI (myocardial infarction) ([facility] 08/2015  . Multiple myeloma ([facility] dx'd 2015   off chemo since 2016  . Nausea and vomiting 05/10/2016   3-4 month hx of nausea and vomiting  . PAD (peripheral artery disease) ([facility] [facility]   . Stroke ([facility]    "several in 1 year; ? year" (01/13/2016)  . Type II diabetes mellitus ([facility]     Past Surgical History:  Procedure Laterality Date  . CARDIAC CATHETERIZATION  N/A 01/14/2016   Procedure: Left Heart Cath and Cors/Grafts Angiography;  Surgeon: Burnell Blanks, MD;  Location: [place] [facility];  Service: Cardiovascular;  Laterality: N/A;  . CARPAL TUNNEL RELEASE Left   . CORONARY ANGIOPLASTY WITH STENT PLACEMENT  05/2015   DES placed to the mid and distal LAD as well as OM2/notes 01/13/2016  . CORONARY ANGIOPLASTY WITH STENT PLACEMENT  ~ 2010  . CORONARY ARTERY BYPASS GRAFT  11/11/2015   CABG X2 LIMA-LAD and SVG-OM/notes 01/13/2016  . DILATION AND CURETTAGE OF UTERUS    . FEMORAL-POPLITEAL BYPASS GRAFT Left 2013   Archie Endo 01/13/2016  . LAPAROSCOPIC GASTRIC BANDING  2007  .  RENAL ARTERY STENT    . VAGINAL HYSTERECTOMY      Prior to Admission medications   Medication Sig Start Date End Date Taking? Authorizing Provider  alfuzosin (UROXATRAL) 10 MG 24 hr tablet Take 1 tablet (10 mg total) by mouth daily with breakfast. 03/30/16  Yes Festus Aloe, MD  Alirocumab (PRALUENT) 75 MG/ML SOPN Inject 1 pen into the skin every 14 (fourteen) days. 05/06/16  Yes Dorothy Spark, MD  aspirin 81 MG tablet Take 81 mg by mouth daily.   Yes [provider]  Cholecalciferol (VITAMIN D3) 50000 units CAPS Take 1 capsule by mouth 2 (two) times a week.   Yes [provider]  clopidogrel (PLAVIX) 75 MG tablet Take 1 tablet (75 mg total) by mouth daily. 06/03/16  Yes Dorothy Spark, MD  diclofenac sodium (VOLTAREN) 1 % GEL Apply 2 g topically daily as needed for pain.   Yes [provider]  famotidine (PEPCID) 20 MG tablet Take 1 tablet (20 mg total) by mouth 2 (two) times daily. 09/17/16 09/17/17 Yes Veronese, [place], MD  fentaNYL (DURAGESIC - DOSED MCG/HR) 50 MCG/HR Place 50 mcg onto the skin every 3 (three) days.    Yes [provider]  gabapentin (NEURONTIN) 300 MG capsule Take 300-600 mg by mouth See admin instructions. Pt takes 635m in am, 3084mat bedtime   Yes [provider]  hydrocortisone cream 1 % Apply topically as needed for itching. 01/15/16  Yes SmJettie Booze, NP  HYDROmorphone (DILAUDID) 2 MG tablet Take 2 mg by mouth every 4 (four) hours as needed for severe pain.   Yes [provider]  lisinopril (PRINIVIL,ZESTRIL) 5 MG tablet Take 1 tablet (5 mg total) by mouth daily. 01/16/16  Yes SmArbutus LeasNP  metFORMIN (GLUCOPHAGE) 500 MG tablet Take 1,000 mg by mouth 2 (two) times daily with a meal.    Yes [provider]  metoCLOPramide (REGLAN) 10 MG tablet Take 1 tablet (10 mg total) by mouth every 8 (eight) hours as needed for nausea. 09/17/16 09/20/16 Yes Veronese, CaKentuckyMD  nitroGLYCERIN (NITROSTAT) 0.4 MG  SL tablet Place 1 tablet (0.4 mg total) under the tongue every 5 (five) minutes as needed for chest pain. 01/15/16  Yes SmArbutus LeasNP  Omega-3 Fatty Acids (FISH OIL) 1000 MG CAPS Take 2 capsules (2,000 mg total) by mouth daily. 05/06/16  Yes NeDorothy SparkMD  promethazine (PHENERGAN) 12.5 MG tablet Take 1-2 every 6 hours prn for nausea 04/06/16  Yes JaMilus BanisterMD  senna-docusate (SENOKOT-S) 8.6-50 MG tablet Take 2 tablets by mouth 2 (two) times daily as needed for constipation. 11/17/15 11/16/16 Yes [provider]  furosemide (LASIX) 20 MG tablet Take 1 tablet (20 mg total) by mouth daily as needed for fluid or edema. 05/10/16 08/27/16  Dorothy Spark, MD  isosorbide mononitrate (IMDUR) 60 MG 24 hr tablet Take 1 tablet (60 mg total) by mouth daily. 05/10/16 09/17/16  Dorothy Spark, MD  metoprolol tartrate (LOPRESSOR) 25 MG tablet Take 0.5 tablets (12.5 mg total) by mouth 2 (two) times daily. 02/23/16 09/17/16  Dorothy Spark, MD  ondansetron (ZOFRAN ODT) 8 MG disintegrating tablet Take 1 tablet (8 mg total) by mouth every 8 (eight) hours as needed for nausea or vomiting. Patient not taking: Reported on 09/18/2016 09/12/16   Lacretia Leigh, MD  ondansetron (ZOFRAN-ODT) 4 MG disintegrating tablet Take 4 mg by mouth every 8 (eight) hours as needed for nausea or vomiting.    [provider]    Family History  Problem Relation Age of Onset  . Diabetes Mother   . Heart failure Mother   . Prostate cancer Neg Hx   . Kidney cancer Neg Hx   . Bladder Cancer Neg Hx      Social History  Substance Use Topics  . Smoking status: Never Smoker  . Smokeless tobacco: Never Used  . Alcohol use Yes     Comment: occasional wine    Allergies as of 09/18/2016 - Review Complete 09/18/2016  Allergen Reaction Noted  . Other Swelling 09/27/2014  . Statins Swelling 11/29/2015  . Amoxicillin  07/26/2015  . Codeine  07/26/2015  . Sulfa antibiotics  01/13/2016  . Duloxetine Nausea  Only 07/07/2015    Review of Systems:    All systems reviewed and negative except where noted in HPI.   Physical Exam:  Vital signs in last 24 hours: Temp:  [98.2 F (36.8 C)-98.5 F (36.9 C)] 98.5 F (36.9 C) (09/09 0727) Pulse Rate:  [57-69] 68 (09/09 1204) Resp:  [16-18] 16 (09/09 0727) BP: (129-153)/(45-57) 152/52 (09/09 0934) SpO2:  [98 %-100 %] 98 % (09/09 1204)   General:   Pleasant, cooperative in NAD Head:  Normocephalic and atraumatic. Eyes:   No icterus.   Conjunctiva pink. PERRLA. Ears:  Normal auditory acuity. Neck:  Supple; no masses or thyroidomegaly Lungs: Respirations even and unlabored. Lungs clear to auscultation bilaterally.   No wheezes, crackles, or rhonchi.  Heart:  Regular rate and rhythm;  Without murmur, clicks, rubs or gallops Abdomen:  Soft, nondistended, nontender. Normal bowel sounds. No appreciable masses or hepatomegaly.  No rebound or guarding.  Rectal:  Not performed. Msk:  Symmetrical without gross deformities.  Strength normal  Extremities:  Without edema, cyanosis or clubbing. Neurologic:  Alert and oriented x3;  grossly normal neurologically. Skin:  Intact without significant lesions or rashes. Cervical Nodes:  No significant cervical adenopathy. Psych:  Alert and cooperative. Normal affect.  LAB RESULTS:  Recent Labs  09/17/16 1307 09/18/16 1014 09/19/16 0318  WBC 9.2 9.1 10.3  HGB 12.8 13.0 10.9*  HCT 38.3 38.8 32.0*  PLT 235 219 176   BMET  Recent Labs  09/17/16 1307 09/18/16 1014 09/19/16 0318  NA 133* 135 137  K 4.4 4.3 3.7  CL 100* 101 108  CO2 22 25 23   GLUCOSE 184* 207* 113*  BUN 22* 21* 15  CREATININE 1.55* 1.52* 1.34*  CALCIUM 9.3 9.4 8.3*   LFT  Recent Labs  09/19/16 0318  PROT 5.5*  ALBUMIN 3.4*  AST 17  ALT 10*  ALKPHOS 36*  BILITOT 0.9   PT/INR No results for input(s): LABPROT, INR in the last 72 hours.  STUDIES: US Abdomen Limited Ruq  Result Date: 09/17/2016 CLINICAL DATA:  Right upper  quadrant abdominal pain. EXAM: ULTRASOUND ABDOMEN LIMITED RIGHT UPPER QUADRANT COMPARISON:  Abdominal CT 09/12/2016. Abdominal ultrasound 02/10/2016 FINDINGS: Gallbladder: Physiologically distended. No gallstones or wall thickening visualized. No sonographic Murphy sign noted by sonographer. Common bile duct: Diameter: 5 mm, normal. Liver: No focal lesion identified. The tiny subcentimeter low-density lesion in the liver on prior CT is not well seen sonographically. Heterogeneous in parenchymal echogenicity. Portal vein is patent on color Doppler imaging with normal direction of blood flow towards the liver. Shadowing subcutaneous port seen in the anterior abdomen, post gastric banding. IMPRESSION: 1. No gallstones or gallbladder inflammation. No biliary dilatation. 2. Heterogeneous hepatic parenchyma without discrete lesion. Electronically Signed   By: Jeb Levering M.D.   On: 09/17/2016 19:50      Impression / Plan:   Metha Kolasa Sparger is a 76 y.o. y/o female with metabolic syndrome, admitted with intractable nausea. CT A/P was unremarkable. She is on scheduled antiemetics and feels better today. Her A1C is 7.9. 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   - Continue current antiemetics as scheduled - Zofran and phenergan as out pt. Would not recommend reglan as out pt - Continue Protonix 22m daily, and out pt as well - Defer EGD for now unless symptoms recur - She wishes to follow up with me after discharge - Soft diet   Will continue to follow  Thank you for involving me in the care of this patient.      LOS: 0 days   RSherri Sear MD  09/19/2016, 2:15 PM   Note: This dictation was prepared with Dragon dictation along with smaller phrase technology. Any transcriptional errors that result from this process are unintentional.

## 2016-09-19 NOTE — Care Management Note (Addendum)
Case Management Note  Patient Details  Name: [name] MRN: [id number] Date of Birth: July 18, 1940  Subjective/Objective:  76yo [name] was admitted 09/18/16 per intractable vomiting. She lives alone but has [name] son who can assist her if needed. Pharmacy=[facility] on [address] PCP=Laura Bliss. [name] reports "I have all the home equipment that I need. I have [name] RW, [name] rolliator, cane, and an electric scooter. " No home oxygen. No home health services. Drives herself to appointments. Reports that she wants [facility] if home health services are ordered.     Per [name], her street address is [address], [place], [place] [address]. She does not want any mail going to that address. All mail should go to [place], [place],  [phone]. Case management will follow for discharge planning.              Action/Plan:   Expected Discharge Date:                  Expected Discharge Plan:  [place]  In-House Referral:  NA  Discharge planning Services  CM Consult  Post Acute Care Choice:  Home Health Choice offered to:  Patient  DME Arranged:  N/[name] DME Agency:  NA  HH Arranged:    [place] Agency:   (Requests [facility] if [facility] is ordered)  Status of Service:  In process, will continue to follow  If discussed at Long Length of Stay Meetings, dates discussed:    Additional Comments:  [name] Igoe A, RN 09/19/2016, 4:08 PM

## 2016-09-19 NOTE — Progress Notes (Signed)
Anticoagulation monitoring(Lovenox):  76yo  female ordered Lovenox 30 mg Q24h  Filed Weights   09/18/16 1006  Weight: 145 lb (65.8 kg)   BMI 26.7   Lab Results  Component Value Date   CREATININE 1.34 (H) 09/19/2016   CREATININE 1.52 (H) 09/18/2016   CREATININE 1.55 (H) 09/17/2016   Estimated Creatinine Clearance: 32.3 mL/min (A) (by C-G formula based on SCr of 1.34 mg/dL (H)). Hemoglobin & Hematocrit     Component Value Date/Time   HGB 10.9 (L) 09/19/2016 0318   HGB 10.9 (L) 06/03/2016 1102   HCT 32.0 (L) 09/19/2016 0318   HCT 34.7 06/03/2016 1102     Per Protocol for Patient with estCrcl > 30 ml/min and BMI < 40, will transition to Lovenox 40 mg Q24h.

## 2016-09-19 NOTE — Progress Notes (Signed)
Pt resting in room eating diner. Pt states she believes the combination of zofran and reglan are helping a lot more with nausea. Pt refused TEDs and were taken off. Up to bathroom with 1 assist. IV fluids discontinued. No other complaints at this time. Will continue to monitor.

## 2016-09-19 NOTE — Progress Notes (Signed)
Pt alert and oriented X4, complaining of nausea. Phenergan given with little to no relief stated by patient. Will continue to monitor.

## 2016-09-19 NOTE — Evaluation (Signed)
Physical Therapy Evaluation Patient Details Name: [name] MRN: [id number] DOB: 1940/10/15 Today's Date: 09/19/2016   History of Present Illness  Patient is a 76 yo female who presents to the hospital with increased nausea and vomitting over the past 10 days. Patient has a PMH of CABG, CAD, Divericulitis, lap band surgery, DM, and PAD. Patient also presents with chronic angina and a gastric ulcer one year prior.   Clinical Impression  Patient demonstrated mod I for bed mobility and is unable to perform bed mobility I secondary to weakness. Patient demonstrated requirement of supervision assist for performing sit to stands and ambulation. Patient demonstrates increased fatigue and balance with ambulation with inability to increase speed when ambulating secondary to fear of falling and fatigue. Ambulated 155ft secondary to increased fatigue with performance. Patient will benefit fromf urther skilled therapy focused on improving balance to decrease fall risk. Recommend HHPT.    Follow Up Recommendations Home health PT    Equipment Recommendations       Recommendations for Other Services       Precautions / Restrictions Precautions Precautions: Fall      Mobility  Bed Mobility Overal bed mobility: Modified Independent             General bed mobility comments: Requires use of bed rails to perform bed mobility but can perform with ModI  Transfers Overall transfer level: Needs assistance Equipment used: Rolling walker (2 wheeled) Transfers: Sit to/from Stand Sit to Stand: Supervision         General transfer comment: Patient slow to perform the action   Ambulation/Gait Ambulation/Gait assistance: Supervision Ambulation Distance (Feet): 100 Feet Assistive device: Rolling walker (2 wheeled) Gait Pattern/deviations: Decreased step length - right;Decreased step length - left   Gait velocity interpretation: Below normal speed for age/gender General Gait Details:  Patient demonstrates slow step through gait with decreased stride length B  Stairs            Wheelchair Mobility    Modified Rankin (Stroke Patients Only)       Balance Overall balance assessment: Needs assistance Sitting-balance support: No upper extremity supported Sitting balance-Leahy Scale: Good Sitting balance - Comments: Able to sit without extremity support   Standing balance support: Bilateral upper extremity supported Standing balance-Leahy Scale: Fair Standing balance comment: Requires use of AD to provide UE support                             Pertinent Vitals/Pain Pain Assessment: No/denies pain    Home Living Family/patient expects to be discharged to:: Private residence Living Arrangements: Alone Available Help at Discharge: Family Type of Home: House Home Access: Level entry     Home Layout: One level Home Equipment: Bedside commode;Walker - 2 wheels;Electric scooter      Prior Function Level of Independence: Independent with assistive device(s)         Comments: Use FWW to ambulate at household and scooter to travel in the community     Hand Dominance        Extremity/Trunk Assessment   Upper Extremity Assessment Upper Extremity Assessment: Overall WFL for tasks assessed    Lower Extremity Assessment Lower Extremity Assessment: Generalized weakness       Communication   Communication: No difficulties  Cognition Arousal/Alertness: Awake/alert Behavior During Therapy: WFL for tasks assessed/performed Overall Cognitive Status: Within Functional Limits for tasks assessed  General Comments      Exercises Other Exercises Other Exercises: Performed ambulation in hallway 153ft, sit to stand, and rolling in bed x3 each way for improving positioning   Assessment/Plan    PT Assessment Patient needs continued PT services  PT Problem List Decreased  strength;Decreased mobility;Decreased range of motion;Decreased activity tolerance;Decreased balance       PT Treatment Interventions Gait training;Stair training;Balance training;Therapeutic activities;Therapeutic exercise;Patient/family education;Functional mobility training    PT Goals (Current goals can be found in the Care Plan section)  Acute Rehab PT Goals Patient Stated Goal: "To decrease nausea" PT Goal Formulation: With patient Time For Goal Achievement: 10/03/16 Potential to Achieve Goals: Fair    Frequency Min 2X/week   Barriers to discharge Other (comment) Decreased balance in standing/ambulating    Co-evaluation               AM-PAC PT "6 Clicks" Daily Activity  Outcome Measure Difficulty turning over in bed (including adjusting bedclothes, sheets and blankets)?: A Little Difficulty moving from lying on back to sitting on the side of the bed? : A Little Difficulty sitting down on and standing up from a chair with arms (e.g., wheelchair, bedside commode, etc,.)?: A Little Help needed moving to and from a bed to chair (including a wheelchair)?: A Little Help needed walking in hospital room?: A Little Help needed climbing 3-5 steps with a railing? : A Little 6 Click Score: 18    End of Session Equipment Utilized During Treatment: Gait belt Activity Tolerance: Patient tolerated treatment well Patient left: in bed;with bed alarm set;with call bell/phone within reach Nurse Communication: Mobility status PT Visit Diagnosis: Unsteadiness on feet (R26.81);Other abnormalities of gait and mobility (R26.89);Muscle weakness (generalized) (M62.81);Difficulty in walking, not elsewhere classified (R26.2)    Time: 0910-0930 PT Time Calculation (min) (ACUTE ONLY): 20 min   Charges:   PT Evaluation $PT Eval Moderate Complexity: 1 Mod PT Treatments $Gait Training: 8-22 mins   PT G Codes:   PT G-Codes **NOT FOR INPATIENT CLASS** Functional Assessment Tool Used: AM-PAC  6 Clicks Basic Mobility Functional Limitation: Mobility: Walking and moving around Mobility: Walking and Moving Around Current Status (J0932): At least 40 percent but less than 60 percent impaired, limited or restricted Mobility: Walking and Moving Around Goal Status [phone]): At least 20 percent but less than 40 percent impaired, limited or restricted   Blythe Stanford, PT DPT 09/19/16, 12:16 PM [phone]

## 2016-09-20 DIAGNOSIS — R112 Nausea with vomiting, unspecified: Secondary | ICD-10-CM | POA: Diagnosis not present

## 2016-09-20 LAB — BASIC METABOLIC PANEL
ANION GAP: 5 (ref 5–15)
BUN: 16 mg/dL (ref 6–20)
CO2: 25 mmol/L (ref 22–32)
Calcium: 8.4 mg/dL — ABNORMAL LOW (ref 8.9–10.3)
Chloride: 109 mmol/L (ref 101–111)
Creatinine, Ser: 1.31 mg/dL — ABNORMAL HIGH (ref 0.44–1.00)
GFR calc non Af Amer: 39 mL/min — ABNORMAL LOW (ref >60)
GFR, EST AFRICAN AMERICAN: 45 mL/min — AB (ref >60)
GLUCOSE: 147 mg/dL — AB (ref 65–99)
Potassium: 3.7 mmol/L (ref 3.5–5.1)
SODIUM: 139 mmol/L (ref 135–145)

## 2016-09-20 LAB — GLUCOSE, CAPILLARY
GLUCOSE-CAPILLARY: 125 mg/dL — AB (ref 65–99)
GLUCOSE-CAPILLARY: 207 mg/dL — AB (ref 65–99)
Glucose-Capillary: 129 mg/dL — ABNORMAL HIGH (ref 65–99)
Glucose-Capillary: 124 mg/dL — ABNORMAL HIGH (ref 65–99)

## 2016-09-20 MED ORDER — ONDANSETRON HCL 4 MG/2ML IJ SOLN
4.0000 mg | Freq: Four times a day (QID) | INTRAMUSCULAR | Status: DC | PRN
  Administered 2016-09-20 – 2016-09-21 (×2): 4 mg via INTRAVENOUS
  Filled 2016-09-20 (×2): qty 2

## 2016-09-20 NOTE — Progress Notes (Signed)
[facility] NAME: [name]    MR#:  [id number]  DATE OF BIRTH:  [birth year]  SUBJECTIVE:   Patient here due to intractable nausea and vomiting.  Pt. Was feeling better yesterday evening but developed some Nausea, dry heaving this a.m. Seen by GI and Gatric Emptying study ordered by them.   REVIEW OF SYSTEMS:    Review of Systems  Constitutional: Negative for chills and fever.  HENT: Negative for congestion and tinnitus.   Eyes: Negative for blurred vision and double vision.  Respiratory: Negative for cough, shortness of breath and wheezing.   Cardiovascular: Negative for chest pain, orthopnea and PND.  Gastrointestinal: Positive for nausea. Negative for abdominal pain, diarrhea and vomiting.  Genitourinary: Negative for dysuria and hematuria.  Neurological: Negative for dizziness, sensory change and focal weakness.  All other systems reviewed and are negative.   Nutrition: soft diet Tolerating Diet: Yes Tolerating PT: Await Eval.    DRUG ALLERGIES:   Allergies  Allergen Reactions  . Other Swelling    Smoke--Head swelling  . Statins Swelling  . Amoxicillin   . Codeine   . Sulfa Antibiotics   . Duloxetine Nausea Only    VITALS:  Blood pressure (!) 145/55, pulse 71, temperature 98.2 F (36.8 C), temperature source Oral, resp. rate 18, height 5\' 2"  (1.575 m), weight 65.8 kg (145 lb), SpO2 99 %.  PHYSICAL EXAMINATION:   Physical Exam  GENERAL:  76 y.o.-year-old obese patient lying in bed in no acute distress.  EYES: Pupils equal, round, reactive to light and accommodation. No scleral icterus. Extraocular muscles intact.  HEENT: Head atraumatic, normocephalic. Oropharynx and nasopharynx clear.  NECK:  Supple, no jugular venous distention. No thyroid enlargement, no tenderness.  LUNGS: Normal breath sounds bilaterally, no wheezing, rales, rhonchi. No use of accessory muscles of respiration.  CARDIOVASCULAR: S1, S2  normal. No murmurs, rubs, or gallops.  ABDOMEN: Soft, nontender, nondistended. Bowel sounds present. No organomegaly or mass.  EXTREMITIES: No cyanosis, clubbing or edema b/l.    NEUROLOGIC: Cranial nerves II through XII are intact. No focal Motor or sensory deficits b/l.   PSYCHIATRIC: The patient is alert and oriented x 3.  SKIN: No obvious rash, lesion, or ulcer.     LABORATORY PANEL:   CBC  Recent Labs Lab 09/19/16 0318  WBC 10.3  HGB 10.9*  HCT 32.0*  PLT 176   ------------------------------------------------------------------------------------------------------------------  Chemistries   Recent Labs Lab 09/19/16 0318 09/20/16 0312  NA 137 139  K 3.7 3.7  CL 108 109  CO2 23 25  GLUCOSE 113* 147*  BUN 15 16  CREATININE 1.34* 1.31*  CALCIUM 8.3* 8.4*  AST 17  --   ALT 10*  --   ALKPHOS 36*  --   BILITOT 0.9  --    ------------------------------------------------------------------------------------------------------------------  Cardiac Enzymes  Recent Labs Lab 09/17/16 1307  TROPONINI <0.03   ------------------------------------------------------------------------------------------------------------------  RADIOLOGY:  No results found.   ASSESSMENT AND PLAN:   76 year old female with past medical history of Type II DM w/out complication, peripheral vascular disease, history of previous MI, ischemic cardiac myopathy, hypertension, CHF who presents to the hospital due to intractable nausea vomiting.  1. Intractable N/V - etiology unclear presently CT abdomen/RUQ [place] (-) for acute pathology.  - started on Reglan yesterday and also maintained on Zofran, Phenergan and was feeling better but this a.m. Developed N/dry heaving again.  - seen by GI and Gastric Emptying study ordered for today.  -  Will cont. Supportive care w/ IV fluids, anti-emetics.   2. DM Type II w/out complication - continue sliding scale insulin. - BS stable.   3. Diabetic  neuropathy-continue gabapentin  4. Essential hypertension-continue metoprolol, lisinopril, Imdur.  5.chronic pain-continue fentanyl patch.   Discussed plan with GI (Dr. Marius Ditch)  All the records are reviewed and case discussed with Care Management/Social Worker. Management plans discussed with the patient, family and they are in agreement.  CODE STATUS: Full code  DVT Prophylaxis: Lovenox  TOTAL TIME TAKING CARE OF THIS PATIENT: 25 minutes.   POSSIBLE D/C IN 1-2 DAYS, DEPENDING ON CLINICAL CONDITION.   Henreitta Leber M.D on 09/20/2016 at 2:32 PM  Between 7am to 6pm - Pager - [phone]  After 6pm go to [email or web address] - Proofreader  [facility]  Office  [phone]  CC: Primary care physician; Lynnell Jude, MD

## 2016-09-20 NOTE — Care Management Note (Signed)
Case Management Note  Patient Details  Name: [name] MRN: [id number] Date of Birth: April 26, 1940  Subjective/Objective:   Met with patient at bedside. Discussed PT recommendations. [facility] agreeable and [facility]. [facility]erral [facility] for HHPT.                  Action/Plan:   Expected Discharge Date:                  Expected Discharge Plan:  [facility]  In-House Referral:     Discharge planning Services  CM Consult  Post Acute Care Choice:  Home Health Choice offered to:  Patient  DME Arranged:  N/A DME Agency:  NA  HH Arranged:  PT [place] Agency:   (Requests [facility] if [facility] is ordered)  Status of Service:  In process, will continue to follow  If discussed at Long Length of Stay Meetings, dates discussed:    Additional Comments:  Jolly Mango, RN 09/20/2016, 11:17 AM

## 2016-09-20 NOTE — Care Management Obs Status (Signed)
[facility] NOTIFICATION   Patient Details  Name: Kaleea Penner Bear MRN: [id number] Date of Birth: [birth year]   Medicare Observation Status Notification Given:  Yes    Jolly Mango, RN 09/20/2016, 9:50 AM

## 2016-09-20 NOTE — Progress Notes (Signed)
[name] Darby, MD [address]  [name]  [name], [place] [address]  Main: [phone]  Fax: [phone] Pager: [phone]   [name] is being followed for intractable nausea Day 1 of follow up    Subjective: Woke up with early morning nausea Reports that her left-sided abdominal pain is significantly better, had a BM this morning Was able to tolerate soft food for dinner without emesis  Objective: Vital signs in last 24 hours: Vitals:   09/19/16 1204 09/19/16 1455 09/19/16 2143 09/20/16 0722  BP:  (!) 132/52 (!) 130/45 (!) 145/55  Pulse: 68 67 (!) 59 71  Resp:  18    Temp:  98.3 F (36.8 C) 98.2 F (36.8 C) 98.2 F (36.8 C)  TempSrc:  Oral Oral Oral  SpO2: 98% 97% 100% 99%  Weight:      Height:       Weight change:   Intake/Output Summary (Last 24 hours) at 09/20/16 0926 Last data filed at 09/19/16 1800  Gross per 24 hour  Intake             1305 ml  Output                0 ml  Net             1305 ml     Exam: Heart:: Regular rate and rhythm or S1S2 present Lungs: clear to auscultation Abdomen: soft, nontender, normal bowel sounds   Lab Results: CBC Latest Ref Rng & Units 09/19/2016 09/18/2016 09/17/2016  WBC 3.6 - 11.0 K/uL 10.3 9.1 9.2  Hemoglobin 12.0 - 16.0 g/dL 10.9(L) 13.0 12.8  Hematocrit 35.0 - 47.0 % 32.0(L) 38.8 38.3  Platelets 150 - 440 K/uL 176 219 235   BMP Latest Ref Rng & Units 09/20/2016 09/19/2016 09/18/2016  Glucose 65 - 99 mg/dL 147(H) 113(H) 207(H)  BUN 6 - 20 mg/dL 16 15 21(H)  Creatinine 0.44 - 1.00 mg/dL 1.31(H) 1.34(H) 1.52(H)  BUN/Creat Ratio 12 - 28 - - -  Sodium 135 - 145 mmol/L 139 137 135  Potassium 3.5 - 5.1 mmol/L 3.7 3.7 4.3  Chloride 101 - 111 mmol/L 109 108 101  CO2 22 - 32 mmol/L 25 23 25   Calcium 8.9 - 10.3 mg/dL 8.4(L) 8.3(L) 9.4   Hepatic Function Latest Ref Rng & Units 09/19/2016 09/18/2016 09/17/2016  Total Protein 6.5 - 8.1 g/dL 5.5(L) 7.3 6.8  Albumin 3.5 - 5.0 g/dL 3.4(L) 4.3 4.1  AST 15 - 41 U/L  17 21 21   ALT 14 - 54 U/L 10(L) 15 14  Alk Phosphatase 38 - 126 U/L 36(L) 50 47  Total Bilirubin 0.3 - 1.2 mg/dL 0.9 0.8 0.9  Bilirubin, Direct 0.00 - 0.40 mg/dL - - -    Micro Results: No results found for this or any previous visit (from the past 240 hour(s)). Studies/Results: No results found. Medications: I have reviewed the patient's current medications. Scheduled Meds: . aspirin  81 mg Oral Daily  . clopidogrel  75 mg Oral Daily  . enoxaparin (LOVENOX) injection  40 mg Subcutaneous Q24H  . fentaNYL  50 mcg Transdermal Q72H  . gabapentin  300 mg Oral QPM  . gabapentin  600 mg Oral q morning - 10a  . insulin aspart  0-5 Units Subcutaneous QHS  . insulin aspart  0-9 Units Subcutaneous TID WC  . isosorbide mononitrate  30 mg Oral Daily  . lisinopril  5 mg Oral Daily  . metoprolol tartrate  12.5  mg Oral BID  . omega-3 acid ethyl esters  1 g Oral Daily  . ondansetron (ZOFRAN) IV  4 mg Intravenous Q6H  . pantoprazole (PROTONIX) IV  40 mg Intravenous Q12H  . Vitamin D (Ergocalciferol)  50,000 Units Oral Once per day on Mon Thu   Continuous Infusions: PRN Meds:.acetaminophen **OR** acetaminophen, nitroGLYCERIN, promethazine, senna-docusate   Assessment: Active Problems:   Intractable nausea and vomiting  [name] is a 76 y.o. y/o female with metabolic syndrome, admitted with intractable nausea. CT A/P was unremarkable. She is on scheduled antiemetics and feels better overall. Her A1C is 7.9. 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 She reports that gabapentin is not working. I suggested her to discuss with her primary oncologist for alternative medications  Plan: - Continue current antiemetics as scheduled - Zofran and phenergan as out pt. Would not recommend reglan as out pt - Ordered gastric emptying study, will follow the results - Continue Protonix 77m daily, and out pt as well - Defer EGD for now unless symptoms recur - GI clinic follow-up  after discharge - Soft diet    LOS: 0 days   Aarini Slee 09/20/2016, 9:26 AM

## 2016-09-21 ENCOUNTER — Other Ambulatory Visit: Payer: Medicare Other

## 2016-09-21 DIAGNOSIS — Z9884 Bariatric surgery status: Secondary | ICD-10-CM | POA: Diagnosis not present

## 2016-09-21 DIAGNOSIS — Z955 Presence of coronary angioplasty implant and graft: Secondary | ICD-10-CM | POA: Diagnosis not present

## 2016-09-21 DIAGNOSIS — F419 Anxiety disorder, unspecified: Secondary | ICD-10-CM | POA: Diagnosis present

## 2016-09-21 DIAGNOSIS — E860 Dehydration: Secondary | ICD-10-CM | POA: Diagnosis present

## 2016-09-21 DIAGNOSIS — G8929 Other chronic pain: Secondary | ICD-10-CM | POA: Diagnosis present

## 2016-09-21 DIAGNOSIS — E1140 Type 2 diabetes mellitus with diabetic neuropathy, unspecified: Secondary | ICD-10-CM | POA: Diagnosis present

## 2016-09-21 DIAGNOSIS — E7800 Pure hypercholesterolemia, unspecified: Secondary | ICD-10-CM | POA: Diagnosis present

## 2016-09-21 DIAGNOSIS — Z8711 Personal history of peptic ulcer disease: Secondary | ICD-10-CM | POA: Diagnosis not present

## 2016-09-21 DIAGNOSIS — I252 Old myocardial infarction: Secondary | ICD-10-CM | POA: Diagnosis not present

## 2016-09-21 DIAGNOSIS — Z951 Presence of aortocoronary bypass graft: Secondary | ICD-10-CM | POA: Diagnosis not present

## 2016-09-21 DIAGNOSIS — N179 Acute kidney failure, unspecified: Secondary | ICD-10-CM | POA: Diagnosis present

## 2016-09-21 DIAGNOSIS — I25119 Atherosclerotic heart disease of native coronary artery with unspecified angina pectoris: Secondary | ICD-10-CM | POA: Diagnosis present

## 2016-09-21 DIAGNOSIS — E8881 Metabolic syndrome: Secondary | ICD-10-CM | POA: Diagnosis present

## 2016-09-21 DIAGNOSIS — I110 Hypertensive heart disease with heart failure: Secondary | ICD-10-CM | POA: Diagnosis present

## 2016-09-21 DIAGNOSIS — Z7982 Long term (current) use of aspirin: Secondary | ICD-10-CM | POA: Diagnosis not present

## 2016-09-21 DIAGNOSIS — I509 Heart failure, unspecified: Secondary | ICD-10-CM | POA: Diagnosis present

## 2016-09-21 DIAGNOSIS — E1151 Type 2 diabetes mellitus with diabetic peripheral angiopathy without gangrene: Secondary | ICD-10-CM | POA: Diagnosis present

## 2016-09-21 DIAGNOSIS — Z79899 Other long term (current) drug therapy: Secondary | ICD-10-CM | POA: Diagnosis not present

## 2016-09-21 DIAGNOSIS — R112 Nausea with vomiting, unspecified: Secondary | ICD-10-CM | POA: Diagnosis present

## 2016-09-21 DIAGNOSIS — C9000 Multiple myeloma not having achieved remission: Secondary | ICD-10-CM | POA: Diagnosis present

## 2016-09-21 DIAGNOSIS — Z8673 Personal history of transient ischemic attack (TIA), and cerebral infarction without residual deficits: Secondary | ICD-10-CM | POA: Diagnosis not present

## 2016-09-21 DIAGNOSIS — I255 Ischemic cardiomyopathy: Secondary | ICD-10-CM | POA: Diagnosis present

## 2016-09-21 DIAGNOSIS — Z7902 Long term (current) use of antithrombotics/antiplatelets: Secondary | ICD-10-CM | POA: Diagnosis not present

## 2016-09-21 DIAGNOSIS — Z7984 Long term (current) use of oral hypoglycemic drugs: Secondary | ICD-10-CM | POA: Diagnosis not present

## 2016-09-21 LAB — GLUCOSE, CAPILLARY
Glucose-Capillary: 97 mg/dL (ref 65–99)
Glucose-Capillary: 189 mg/dL — ABNORMAL HIGH (ref 65–99)

## 2016-09-21 MED ORDER — PROMETHAZINE HCL 12.5 MG PO TABS: 12.5000 mg | ORAL_TABLET | Freq: Four times a day (QID) | ORAL | 0 refills | Status: DC | PRN

## 2016-09-21 MED ORDER — ONDANSETRON 4 MG PO TBDP: 4.0000 mg | ORAL_TABLET | Freq: Three times a day (TID) | ORAL | 0 refills | Status: DC | PRN

## 2016-09-21 MED ORDER — PANTOPRAZOLE SODIUM 40 MG PO TBEC: 40.0000 mg | DELAYED_RELEASE_TABLET | Freq: Two times a day (BID) | ORAL | Status: DC

## 2016-09-21 NOTE — Progress Notes (Signed)
Physical Therapy Treatment Patient Details Name: [name] MRN: [id number] DOB: 1940/05/17 Today's Date: 09/21/2016    History of Present Illness Patient is a 76 yo female who presents to the hospital with increased nausea and vomitting over the past 10 days. Patient has a PMH of CABG, CAD, Divericulitis, lap band surgery, DM, and PAD. Patient also presents with chronic angina and a gastric ulcer one year prior.     PT Comments    In and out of bed with ease.  Pt was able to ambulate x 2 around unit this am with walker and supervision.  Overall does well without difficulty.  Some safety deficits and educated as appropriate.  Pt fatigued with gait and stated she is not at her baseline.  HHPT is appropriate at this time.   Follow Up Recommendations  Home health PT     Equipment Recommendations       Recommendations for Other Services       Precautions / Restrictions Precautions Precautions: Fall    Mobility  Bed Mobility Overal bed mobility: Modified Independent                Transfers Overall transfer level: Needs assistance Equipment used: Rolling walker (2 wheeled) Transfers: Sit to/from Stand Sit to Stand: Supervision            Ambulation/Gait Ambulation/Gait assistance: Supervision Ambulation Distance (Feet): 400 Feet Assistive device: Rolling walker (2 wheeled) Gait Pattern/deviations: Step-through pattern   Gait velocity interpretation: <1.8 ft/sec, indicative of risk for recurrent falls     Stairs            Wheelchair Mobility    Modified Rankin (Stroke Patients Only)       Balance Overall balance assessment: Needs assistance Sitting-balance support: Feet supported Sitting balance-Leahy Scale: Good Sitting balance - Comments: Able to sit without extremity support   Standing balance support: Bilateral upper extremity supported Standing balance-Leahy Scale: Fair Standing balance comment: Requires use of AD to provide UE  support                            Cognition Arousal/Alertness: Awake/alert Behavior During Therapy: WFL for tasks assessed/performed Overall Cognitive Status: Within Functional Limits for tasks assessed                                        Exercises      General Comments        Pertinent Vitals/Pain Pain Assessment: No/denies pain    Home Living                      Prior Function            PT Goals (current goals can now be found in the care plan section) Progress towards PT goals: Progressing toward goals    Frequency    Min 2X/week      PT Plan Current plan remains appropriate    Co-evaluation              AM-PAC PT "6 Clicks" Daily Activity  Outcome Measure  Difficulty turning over in bed (including adjusting bedclothes, sheets and blankets)?: None Difficulty moving from lying on back to sitting on the side of the bed? : None Difficulty sitting down on and standing up from a chair with arms (e.g., wheelchair, bedside  commode, etc,.)?: None Help needed moving to and from a bed to chair (including a wheelchair)?: A Little Help needed walking in hospital room?: A Little Help needed climbing 3-5 steps with a railing? : A Little 6 Click Score: 21    End of Session Equipment Utilized During Treatment: Gait belt Activity Tolerance: Patient tolerated treatment well Patient left: in bed;with call bell/phone within reach         Time: 1047-1057 PT Time Calculation (min) (ACUTE ONLY): 10 min  Charges:  $Gait Training: 8-22 mins                    G Codes:       Chesley Noon, PTA 09/21/16, 11:10 AM

## 2016-09-21 NOTE — Progress Notes (Signed)
PHARMACIST - PHYSICIAN COMMUNICATION DR:   Tressia Miners CONCERNING: Antibiotic IV to Oral Route Change Policy  RECOMMENDATION: This patient is receiving Protonix  by the intravenous route.  Based on criteria approved by the Pharmacy and Therapeutics Committee, the antibiotic(s) is/are being converted to the equivalent oral dose form(s).   DESCRIPTION: These criteria include:  Patient being treated for a respiratory tract infection, urinary tract infection, cellulitis or clostridium difficile associated diarrhea if on metronidazole  The patient is not neutropenic and does not exhibit a GI malabsorption state  The patient is eating (either orally or via tube) and/or has been taking other orally administered medications for a least 24 hours  The patient is improving clinically and has a Tmax < 100.5  If you have questions about this conversion, please contact the Pharmacy Department  []   [phone] )  [facility] [x]   [phone] )  [facility] []   [phone] )  [facility] []   [phone] )  [facility] []   [phone] )  [facility]una  Larene [place], PharmD

## 2016-09-21 NOTE — Progress Notes (Signed)
Pt brought back from [facility]. Unable to have study today due to eating breakfast and lunch. Per Nuc Med staff the patient was to be NPO after midnight and she would be fed by the nuc med staff during the study. Paged MD to update. Higher education careers adviser.

## 2016-09-21 NOTE — Discharge Summary (Signed)
[facility] [place] NAME: [name]    MR#:  [id number]  DATE OF BIRTH:  1940/05/21  DATE OF ADMISSION:  09/18/2016 ADMITTING PHYSICIAN: Gladstone Lighter, MD  DATE OF DISCHARGE: 09/21/2016  PRIMARY CARE PHYSICIAN: Lynnell Jude, MD    ADMISSION DIAGNOSIS:  Nausea and vomiting, intractability of vomiting not specified, unspecified vomiting type [R11.2]  DISCHARGE DIAGNOSIS:  Active Problems:   Intractable nausea and vomiting   SECONDARY DIAGNOSIS:   Past Medical History:  Diagnosis Date  . Anginal pain ([place])   . Anxiety   . Arthritis    "shoulders primarily" (01/13/2016)  . CAD (coronary artery disease)    s/p CABG in Oct 2018  . CHF (congestive heart failure) ([place])   . Chronic pain    from her multiple myeloma but says that her pain is usually in her legs/notes 01/13/2016  . High cholesterol   . History of blood transfusion    "numerous; related to procedures for my heart" (01/13/2016)  . Hypertension   . Ischemic cardiomyopathy    /nots 01/13/2016  . MI (myocardial infarction) ([place])    EKG on arrival 01/13/2016 showed NSR with evidence of old anterior and inferior infarcts  . MI (myocardial infarction) ([place]) 08/2015  . Multiple myeloma ([place]) dx'd 2015   off chemo since 2016  . Nausea and vomiting 05/10/2016   3-4 month hx of nausea and vomiting  . PAD (peripheral artery disease) ([place])   . Stroke [facility])    "several in 1 year; ? year" (01/13/2016)  . Type II diabetes mellitus [facility])     HOSPITAL COURSE:   76 year old female with past medical history of Type II DM w/out complication, peripheral vascular disease, history of previous MI, ischemic cardiac myopathy, hypertension, CHF who presents to the hospital due to intractable nausea vomiting.  1. Intractable N/V - patient presented to the hospital due to intractable nausea and vomiting. She was treated supportively with IV fluids, antiemetics. She underwent a right upper quadrant  ultrasound and a CT scan of abdomen pelvis which was negative for acute pathology. A gastroenterology consult was obtained with did not recommend any further intervention except for a gastric emptying study. -Patient was scheduled for gastric emptying study today but it was canceled as the patient ate overnight and this morning. Patient's symptoms although have significantly improved since admission with supportive care. She is presently being discharged on oral Zofran and Phenergan as he for nausea. - She'll follow-up with gastroenterology and get a gastric emptying study scheduled as an outpatient.  2. DM Type II w/out complication - while in the hospital patient was maintained on sliding scale insulin but will resume her metformin upon discharge.  3. Diabetic neuropathy- she will continue gabapentin  4. Essential hypertension- she will continue metoprolol, lisinopril, Imdur.  5.chronic pain- she will continue fentanyl patch and Dilaudid PO.    DISCHARGE CONDITIONS:   Stable.   CONSULTS OBTAINED:  Treatment Team:  Lin Landsman, MD  DRUG ALLERGIES:   Allergies  Allergen Reactions  . Other Swelling    Smoke--Head swelling  . Statins Swelling  . Amoxicillin   . Codeine   . Sulfa Antibiotics   . Duloxetine Nausea Only    DISCHARGE MEDICATIONS:   Allergies as of 09/21/2016      Reactions   Other Swelling   Smoke--Head swelling   Statins Swelling   Amoxicillin    Codeine    Sulfa Antibiotics  Duloxetine Nausea Only      Medication List    STOP taking these medications   metoCLOPramide 10 MG tablet Commonly known as:  REGLAN     TAKE these medications   alfuzosin 10 MG 24 hr tablet Commonly known as:  UROXATRAL Take 1 tablet (10 mg total) by mouth daily with breakfast.   aspirin 81 MG tablet Take 81 mg by mouth daily.   clopidogrel 75 MG tablet Commonly known as:  PLAVIX Take 1 tablet (75 mg total) by mouth daily.   diclofenac sodium 1 %  Gel Commonly known as:  VOLTAREN Apply 2 g topically daily as needed for pain.   famotidine 20 MG tablet Commonly known as:  PEPCID Take 1 tablet (20 mg total) by mouth 2 (two) times daily.   fentaNYL 50 MCG/HR Commonly known as:  DURAGESIC - dosed mcg/hr Place 50 mcg onto the skin every 3 (three) days.   Fish Oil 1000 MG Caps Take 2 capsules (2,000 mg total) by mouth daily.   furosemide 20 MG tablet Commonly known as:  LASIX Take 1 tablet (20 mg total) by mouth daily as needed for fluid or edema.   gabapentin 300 MG capsule Commonly known as:  NEURONTIN Take 300-600 mg by mouth See admin instructions. Pt takes 635m in am, 3029mat bedtime   hydrocortisone cream 1 % Apply topically as needed for itching.   HYDROmorphone 2 MG tablet Commonly known as:  DILAUDID Take 2 mg by mouth every 4 (four) hours as needed for severe pain.   isosorbide mononitrate 60 MG 24 hr tablet Commonly known as:  IMDUR Take 1 tablet (60 mg total) by mouth daily.   lisinopril 5 MG tablet Commonly known as:  PRINIVIL,ZESTRIL Take 1 tablet (5 mg total) by mouth daily.   metFORMIN 500 MG tablet Commonly known as:  GLUCOPHAGE Take 1,000 mg by mouth 2 (two) times daily with a meal.   metoprolol tartrate 25 MG tablet Commonly known as:  LOPRESSOR Take 0.5 tablets (12.5 mg total) by mouth 2 (two) times daily.   nitroGLYCERIN 0.4 MG SL tablet Commonly known as:  NITROSTAT Place 1 tablet (0.4 mg total) under the tongue every 5 (five) minutes as needed for chest pain.   ondansetron 4 MG disintegrating tablet Commonly known as:  ZOFRAN-ODT Take 1 tablet (4 mg total) by mouth every 8 (eight) hours as needed for nausea or vomiting. What changed:  Another medication with the same name was removed. Continue taking this medication, and follow the directions you see here.   PRALUENT 75 MG/ML Sopn Generic drug:  Alirocumab Inject 1 pen into the skin every 14 (fourteen) days.   promethazine 12.5 MG  tablet Commonly known as:  PHENERGAN Take 1-2 every 6 hours prn for nausea What changed:  Another medication with the same name was added. Make sure you understand how and when to take each.   promethazine 12.5 MG tablet Commonly known as:  PHENERGAN Take 1 tablet (12.5 mg total) by mouth every 6 (six) hours as needed for nausea or vomiting. What changed:  You were already taking a medication with the same name, and this prescription was added. Make sure you understand how and when to take each.   senna-docusate 8.6-50 MG tablet Commonly known as:  Senokot-S Take 2 tablets by mouth 2 (two) times daily as needed for constipation.   Vitamin D3 50000 units Caps Take 1 capsule by mouth 2 (two) times a week.  Discharge Care Instructions        Start     Ordered   09/21/16 0000  ondansetron (ZOFRAN-ODT) 4 MG disintegrating tablet  Every 8 hours PRN     09/21/16 1101   09/21/16 0000  Activity as tolerated - No restrictions     09/21/16 1101   09/21/16 0000  Increase activity slowly     09/21/16 1101   09/21/16 0000  Diet - low sodium heart healthy     09/21/16 1101   09/21/16 0000  Diet Carb Modified     09/21/16 1101   09/21/16 0000  promethazine (PHENERGAN) 12.5 MG tablet  Every 6 hours PRN     09/21/16 1331        DISCHARGE INSTRUCTIONS:   DIET:  Cardiac diet and Diabetic diet  DISCHARGE CONDITION:  Stable  ACTIVITY:  Activity as tolerated  OXYGEN:  Home Oxygen: No.   Oxygen Delivery: room air  DISCHARGE LOCATION:  home with Home health PT  If you experience worsening of your admission symptoms, develop shortness of breath, life threatening emergency, suicidal or homicidal thoughts you must seek medical attention immediately by calling 911 or calling your MD immediately  if symptoms less severe.  You Must read complete instructions/literature along with all the possible adverse reactions/side effects for all the Medicines you take and that have  been prescribed to you. Take any new Medicines after you have completely understood and accpet all the possible adverse reactions/side effects.   Please note  You were cared for by a hospitalist during your hospital stay. If you have any questions about your discharge medications or the care you received while you were in the hospital after you are discharged, you can call the unit and asked to speak with the hospitalist on call if the hospitalist that took care of you is not available. Once you are discharged, your primary care physician will handle any further medical issues. Please note that NO REFILLS for any discharge medications will be authorized once you are discharged, as it is imperative that you return to your primary care physician (or establish a relationship with a primary care physician if you do not have one) for your aftercare needs so that they can reassess your need for medications and monitor your lab values.     Today   Nausea/vomting improved.  Was scheduled for Gastric emptying study today but cancelled as pt. Ate.  Will d/c home and follow up with GI as outpatient.   VITAL SIGNS:  Blood pressure (!) 104/31, pulse 65, temperature 97.9 F (36.6 C), temperature source Oral, resp. rate 18, height _0  (1.575 m), weight 65.8 kg (145 lb), SpO2 95 %.  I/O:   Intake/Output Summary (Last 24 hours) at 09/21/16 1357 Last data filed at 09/21/16 0800  Gross per 24 hour  Intake              360 ml  Output                0 ml  Net              360 ml    PHYSICAL EXAMINATION:  GENERAL:  76 y.o.-year-old patient lying in the bed with no acute distress.  EYES: Pupils equal, round, reactive to light and accommodation. No scleral icterus. Extraocular muscles intact.  HEENT: Head atraumatic, normocephalic. Oropharynx and nasopharynx clear.  NECK:  Supple, no jugular venous distention. No thyroid enlargement, no tenderness.  LUNGS: Normal breath  sounds bilaterally, no wheezing,  rales,rhonchi. No use of accessory muscles of respiration.  CARDIOVASCULAR: S1, S2 normal. No murmurs, rubs, or gallops.  ABDOMEN: Soft, non-tender, non-distended. Bowel sounds present. No organomegaly or mass.  EXTREMITIES: No pedal edema, cyanosis, or clubbing.  NEUROLOGIC: Cranial nerves II through XII are intact. No focal motor or sensory defecits b/l.  PSYCHIATRIC: The patient is alert and oriented x 3.   SKIN: No obvious rash, lesion, or ulcer.   DATA REVIEW:   CBC  Recent Labs Lab 09/19/16 0318  WBC 10.3  HGB 10.9*  HCT 32.0*  PLT 176    Chemistries   Recent Labs Lab 09/19/16 0318 09/20/16 0312  NA 137 139  K 3.7 3.7  CL 108 109  CO2 23 25  GLUCOSE 113* 147*  BUN 15 16  CREATININE 1.34* 1.31*  CALCIUM 8.3* 8.4*  AST 17  --   ALT 10*  --   ALKPHOS 36*  --   BILITOT 0.9  --     Cardiac Enzymes  Recent Labs Lab 09/17/16 1307  [place] <0.03    Microbiology Results  Results for orders placed or performed in visit on 04/05/16  Microscopic Examination     Status: None   Collection Time: 04/05/16  8:57 AM  Result Value Ref Range Status   WBC, UA 0-5 0 - 5 /hpf Final   RBC, UA None seen 0 - 2 /hpf Final   Epithelial Cells (non renal) 0-10 0 - 10 /hpf Final   Bacteria, UA Few None seen/Few Final    RADIOLOGY:  No results found.    Management plans discussed with the patient, family and they are in agreement.  CODE STATUS:     Code Status Orders        Start     Ordered   09/18/16 1536  Full code  Continuous     09/18/16 1535    TOTAL TIME TAKING CARE OF THIS PATIENT: 40 minutes.    Henreitta Leber M.D on 09/21/2016 at 1:57 PM  Between 7am to 6pm - Pager - [phone]  After 6pm go to [email or web address] - Proofreader  [facility]  Office  [phone]  CC: Primary care physician; Lynnell Jude, MD

## 2016-09-30 ENCOUNTER — Ambulatory Visit (INDEPENDENT_AMBULATORY_CARE_PROVIDER_SITE_OTHER): Payer: Medicare Other | Admitting: Cardiology

## 2016-09-30 ENCOUNTER — Encounter: Payer: Self-pay | Admitting: Cardiology

## 2016-09-30 VITALS — BP 144/72 | HR 72 | Ht 62.0 in | Wt 162.0 lb

## 2016-09-30 DIAGNOSIS — L988 Other specified disorders of the skin and subcutaneous tissue: Secondary | ICD-10-CM | POA: Insufficient documentation

## 2016-09-30 DIAGNOSIS — I209 Angina pectoris, unspecified: Secondary | ICD-10-CM | POA: Diagnosis not present

## 2016-09-30 DIAGNOSIS — Z951 Presence of aortocoronary bypass graft: Secondary | ICD-10-CM

## 2016-09-30 DIAGNOSIS — E782 Mixed hyperlipidemia: Secondary | ICD-10-CM

## 2016-09-30 DIAGNOSIS — I2510 Atherosclerotic heart disease of native coronary artery without angina pectoris: Secondary | ICD-10-CM

## 2016-09-30 DIAGNOSIS — I10 Essential (primary) hypertension: Secondary | ICD-10-CM | POA: Diagnosis not present

## 2016-09-30 DIAGNOSIS — I25708 Atherosclerosis of coronary artery bypass graft(s), unspecified, with other forms of angina pectoris: Secondary | ICD-10-CM | POA: Diagnosis not present

## 2016-09-30 DIAGNOSIS — I770 Arteriovenous fistula, acquired: Secondary | ICD-10-CM | POA: Diagnosis not present

## 2016-09-30 NOTE — Patient Instructions (Signed)
Medication Instructions:   STOP TAKING PLAVIX NOW    Testing/Procedures:  Your physician has requested that you have a  upper extremity arterial duplex. This test is an ultrasound of the arteries in the legs or arms. It looks at arterial blood flow in the legs and arms. Allow one hour for Lower and Upper Arterial scans. There are no restrictions or special instructions      Follow-Up:  3 MONTHS WITH DR Meda Coffee       If you need a refill on your cardiac medications before your next appointment, please call your pharmacy.

## 2016-09-30 NOTE — Progress Notes (Signed)
Cardiology Office Note    Date:  09/30/2016   ID:  [name], DOB [birth year], MRN [id number]  PCP:  Lynnell Jude, MD  Cardiologist:   Ena Dawley, MD   Chief complain: 1 month follow up  History of Present Illness:  [name] is a 76 y.o. female with a past medical history of CAD s/p CABG, DM, HTN, PAD s/p L fem pop in 2013, multiple myeloma, and ischemic cardiomyopathy.  In May 2017 she presented to [facility] with abdominal pain and chest discomfort, she ruled in for NSTEMI and underwent heart catheterization. She had DES placed to the mid and distal LAD as well as OM2. Also with mid circumflex lesion that was not intervened upon as it was felt to be chronic.  In October 2017 she presented to [facility] with a 2 day history of persistent nausea and vomiting. In the ED she developed left sided jaw pain and substernal chest pressure. Pharmacologic stress test was abnormal and she underwent LHC. She subsequently underwent 2 vessel CABG on 11/11/15 with LIMA-LAD and SVG-OM.  She says that her back pain is her anginal equivalent (although not charted as such when notes reviewed in [place]). She suffers from chronic pain from her multiple myeloma but says that her pain is usually in her legs.   LHC: 01/15/2016 patent bypass grafts.  2. The distal left main has a 70% stenosis.  3. The LAD has diffuse proximal 90% stenosis. The mid LAD stent has 80% restenosis in the proximal segment of the stent. The mid and distal vessel fills from the patent LIMA graft. Competitive flow is seen in the mid LAD on antegrade injections.  4. The Circumflex has a patent mid stent. The first OM branch has diffuse moderate stenosis. The vein graft inserts into this OM branch. The entire Circumflex and OM system fills from antegrade flow and from the vein graft.  5. The RCA is a small to moderate caliber non-dominant vessel with 100% proximal occlusion. The entire vessel fills from left to right  collaterals.   Recommendations: No targets for PCI. Both grafts are open. The RCA is chronically occluded but fills from collaterals. Continue medical management of CAD.   02/23/2016 - the patient is coming after 1 months, in the interim she hasn't experienced any chest pain. She has been experiencing right-sided abdominal pain after every meal, her primary care physician order abdominal ultrasound that didn't show any signs of acute cholecystitis. The patient has lost 40 pounds in the last 2 months. She continues to experience chills, she has been treated for urinary tract infection that is recurrent in her case. No fever no cough recent chest x-ray negative for any pneumonia. No palpitations or syncope no lower extremity edema orthopnea or proximal nocturnal dyspnea. She has been experiencing significant fatigue and orthostatic hypotension.  05/10/16 - 3 months follow-up, at the last visit she was just pain-free but complained of orthostatic hypotension and we decreased her dose of lmdur. She now complains of severe jaw pain that happens 2-3 times a month at rest and is relieved by nitroglycerin almost immediately. She denies any palpitations syncope or falls she is occasionally on and off lower extremity edema that is mild. She was approved for Praluent and her LDL dropped from 133-66, she was started on fish oil for high triglycerides.  06/04/2016 - today she states that she has been feeling well, very rare chest pain, she only took NTG once since the last visit, she  denies DOE, no LE edema, orthopnea, PND. She has noticed night sweats for about a month. The only change to her medication was switching gabapentin to Lyrica at about the same time.   09/30/2016 - this is 4 months follow-up, patient denies any chest pain shortness of breath, she walks with a walker and has no claudications no palpitations dizziness or syncope. She has been suffering with chronic nausea that got significantly worse couple  weeks ago and required hospitalization. She had upper endoscopy performed earlier this year in March by Dr. Ardis Hughs and it showed gastritis and duodenal diverticulum, she is supposed to have an outpatient gastric emptying study and follow-up with Dr. Ardis Hughs she hasn't done it yet. She is complaining of significant bruising.  Past Medical History:  Diagnosis Date  . Anginal pain ([place])   . Anxiety   . Arthritis    "shoulders primarily" (01/13/2016)  . CAD (coronary artery disease)    s/p CABG in Oct 2018  . CHF (congestive heart failure) ([place])   . Chronic pain    from her multiple myeloma but says that her pain is usually in her legs/notes 01/13/2016  . High cholesterol   . History of blood transfusion    "numerous; related to procedures for my heart" (01/13/2016)  . Hypertension   . Ischemic cardiomyopathy    /nots 01/13/2016  . MI (myocardial infarction) ([place])    EKG on arrival 01/13/2016 showed NSR with evidence of old anterior and inferior infarcts  . MI (myocardial infarction) ([place]) 08/2015  . Multiple myeloma ([place]) dx'd 2015   off chemo since 2016  . Nausea and vomiting 05/10/2016   3-4 month hx of nausea and vomiting  . PAD (peripheral artery disease) ([place])   . Stroke [facility])    "several in 1 year; ? year" (01/13/2016)  . Type II diabetes mellitus ([place])     Past Surgical History:  Procedure Laterality Date  . CARDIAC CATHETERIZATION N/A 01/14/2016   Procedure: Left Heart Cath and Cors/Grafts Angiography;  Surgeon: Burnell Blanks, MD;  Location: [place] [facility];  Service: Cardiovascular;  Laterality: N/A;  . CARPAL TUNNEL RELEASE Left   . CORONARY ANGIOPLASTY WITH STENT PLACEMENT  05/2015   DES placed to the mid and distal LAD as well as OM2/notes 01/13/2016  . CORONARY ANGIOPLASTY WITH STENT PLACEMENT  ~ 2010  . CORONARY ARTERY BYPASS GRAFT  11/11/2015   CABG X2 LIMA-LAD and SVG-OM/notes 01/13/2016  . DILATION AND CURETTAGE OF UTERUS    . FEMORAL-POPLITEAL BYPASS GRAFT Left 2013    Archie Endo 01/13/2016  . LAPAROSCOPIC GASTRIC BANDING  2007  . RENAL ARTERY STENT    . VAGINAL HYSTERECTOMY      Current Medications: Outpatient Medications Prior to Visit  Medication Sig Dispense Refill  . alfuzosin (UROXATRAL) 10 MG 24 hr tablet Take 1 tablet (10 mg total) by mouth daily with breakfast. 14 tablet 0  . Alirocumab (PRALUENT) 75 MG/ML SOPN Inject 1 pen into the skin every 14 (fourteen) days. 2 pen 11  . aspirin 81 MG tablet Take 81 mg by mouth daily.    . Cholecalciferol (VITAMIN D3) 50000 units CAPS Take 1 capsule by mouth 2 (two) times a week.    . diclofenac sodium (VOLTAREN) 1 % GEL Apply 2 g topically daily as needed for pain.    . famotidine (PEPCID) 20 MG tablet Take 1 tablet (20 mg total) by mouth 2 (two) times daily. 60 tablet 1  . fentaNYL (DURAGESIC - DOSED  MCG/HR) 50 MCG/HR Place 50 mcg onto the skin every 3 (three) days.     . furosemide (LASIX) 20 MG tablet Take 1 tablet (20 mg total) by mouth daily as needed for fluid or edema. 30 tablet 3  . gabapentin (NEURONTIN) 300 MG capsule Take 300-600 mg by mouth See admin instructions. Pt takes 65m in am, 3029mat bedtime    . hydrocortisone cream 1 % Apply topically as needed for itching. 30 g 0  . HYDROmorphone (DILAUDID) 2 MG tablet Take 2 mg by mouth every 4 (four) hours as needed for severe pain.    . isosorbide mononitrate (IMDUR) 60 MG 24 hr tablet Take 1 tablet (60 mg total) by mouth daily. 90 tablet 3  . lisinopril (PRINIVIL,ZESTRIL) 5 MG tablet Take 1 tablet (5 mg total) by mouth daily. 30 tablet 12  . metFORMIN (GLUCOPHAGE) 500 MG tablet Take 1,000 mg by mouth 2 (two) times daily with a meal.     . metoprolol tartrate (LOPRESSOR) 25 MG tablet Take 0.5 tablets (12.5 mg total) by mouth 2 (two) times daily. 90 tablet 3  . nitroGLYCERIN (NITROSTAT) 0.4 MG SL tablet Place 1 tablet (0.4 mg total) under the tongue every 5 (five) minutes as needed for chest pain. 25 tablet 2  . Omega-3 Fatty Acids (FISH OIL) 1000 MG  CAPS Take 2 capsules (2,000 mg total) by mouth daily. 60 capsule 0  . ondansetron (ZOFRAN-ODT) 4 MG disintegrating tablet Take 1 tablet (4 mg total) by mouth every 8 (eight) hours as needed for nausea or vomiting. 30 tablet 0  . promethazine (PHENERGAN) 12.5 MG tablet Take 1-2 every 6 hours prn for nausea 50 tablet 3  . promethazine (PHENERGAN) 12.5 MG tablet Take 1 tablet (12.5 mg total) by mouth every 6 (six) hours as needed for nausea or vomiting. 30 tablet 0  . senna-docusate (SENOKOT-S) 8.6-50 MG tablet Take 2 tablets by mouth 2 (two) times daily as needed for constipation.    . clopidogrel (PLAVIX) 75 MG tablet Take 1 tablet (75 mg total) by mouth daily. 90 tablet 2   Facility-Administered Medications Prior to Visit  Medication Dose Route Frequency Provider Last Rate Last Dose  . 0.9 %  sodium chloride infusion  500 mL Intravenous Continuous JaMilus BanisterMD         Allergies:   Other; Statins; Amoxicillin; Codeine; Sulfa antibiotics; and Duloxetine   Social History   Social History  . Marital status: Divorced    Spouse name: N/A  . Number of children: 2  . Years of education: N/A   Occupational History  . retired    Social History Main Topics  . Smoking status: Never Smoker  . Smokeless tobacco: Never Used  . Alcohol use Yes     Comment: occasional wine  . Drug use: No  . Sexual activity: Not Asked   Other Topics Concern  . None   Social History Narrative   Independent at baseline. Ambulates with the help of a walker    Family History:  The patient's family history includes Diabetes in her mother; Heart failure in her mother.   ROS:   Please see the history of present illness.    ROS All other systems reviewed and are negative.  PHYSICAL EXAM:   VS:  BP (!) 144/72   Pulse 72   Ht _0  (1.575 m)   Wt 162 lb (73.5 kg)   SpO2 94%   BMI 29.63 kg/m    GEN:  Well nourished, well developed, in no acute distress  HEENT: normal  Neck: no JVD, carotid bruits,  or masses Cardiac: RRR; no murmurs, rubs, or gallops,no edema  Respiratory:  clear to auscultation bilaterally, normal work of breathing GI: soft, nontender, nondistended, + BS MS: no deformity or atrophy  Skin: warm and dry, no rash Neuro:  Alert and Oriented x 3, Strength and sensation are intact Psych: euthymic mood, full affect Extremities: Left upper extremity significant bruising in the forearm, there is also continuous machinelike bruit suspicious for fistula in the left antecubital area, radial pulse is not palpable however there is no cyanosis no swelling. There is normal strength in her left upper extremity. Bilateral lower extremities have good pulses.   Wt Readings from Last 3 Encounters:  09/30/16 162 lb (73.5 kg)  09/18/16 145 lb (65.8 kg)  09/17/16 150 lb (68 kg)     Studies/Labs Reviewed:   EKG:  Not done today  Recent Labs: 01/13/2016: B Natriuretic Peptide 438.8 06/03/2016: TSH 1.990 09/19/2016: ALT 10; Hemoglobin 10.9; Platelets 176 09/20/2016: BUN 16; Creatinine, Ser 1.31; Potassium 3.7; Sodium 139   Lipid Panel    Component Value Date/Time   CHOL 127 06/03/2016 1102   TRIG 117 06/03/2016 1102   HDL 55 06/03/2016 1102   CHOLHDL 2.3 06/03/2016 1102   CHOLHDL 6.7 01/14/2016 0202   VLDL 43 (H) 01/14/2016 0202   LDLCALC 49 06/03/2016 1102   Additional studies/ records that were reviewed today include:   TTE: 01/2016 - Procedure narrative: Transthoracic echocardiography. Image   quality was poor. The study was technically difficult, as a   result of poor acoustic windows and surgical dressings.   Intravenous contrast (Definity) was administered. - Left ventricle: The cavity size was normal. Systolic function was   vigorous. The estimated ejection fraction was in the range of 65%   to 70%. Wall motion was normal; there were no regional wall   motion abnormalities. Doppler parameters are consistent with   abnormal left ventricular relaxation (grade 1  diastolic   dysfunction). The E/e&' ratio is between 8-15, suggesting   indeterminate LV filling pressure. - Inferior vena cava: The vessel was normal in size. The   respirophasic diameter changes were in the normal range (>= 50%),   consistent with normal central venous pressure.  Impressions:  - Technically difficult study. Definity contrast was administered.   LVEF 65-70%, normal wall motion, diastolic dysfunction,   indeterminate LV filling pressure, normal IVC.    ASSESSMENT:    1. Coronary artery disease of bypass graft of native heart with stable angina pectoris ([place])   2. Fistula   3. S/P CABG x 2   4. Essential hypertension   5. Mixed hyperlipidemia   6. AV (arteriovenous fistula) ([facility])      PLAN:  In order of problems listed above:  1. CAD: See cath report above. 2/2 grafts open. Chest pain has almost resolved with increased Imdur from 30-60 mg daily. She bruises easily,We'll continue low-dose aspirin but discontinue Plavix now. No ischemic workup needed she is asymptomatic.   2. S/p CABG - on low-dose metoprolol, aspirin and Praluent.  3. Hyperlipidemia -. She was approved for Praluent, LDL 131->49, TG 240, started on fish oil 1000 mg, repeat 117.  3. Recurrent postprandial abdominal pain, now nausea, follows with Dr Ardis Hughs.  4. History of multiple myeloma: On home medications.She sees Dr. Laverta Baltimore at [facility] for this. She would like to switch to [facility] oncologist, we will try to find  a specialist and refer her.  5. Night sweats - meds reviewed, she is advised to hold lyrica for 1 week, if no improvement than continue. We will obtain labs to check for an infection, she denies cough, fever or dysuria. TSH was normal.  6. Ischemic cardiomyopathy: Last EF 65-70%, continue beta blocker and ACE-I.   7. Severe left antecubital bruit suspicious for AV fistula - we will order an ultrasound and refer to vascular surgery if confirmed.  Medication Adjustments/Labs and  Tests Ordered: Current medicines are reviewed at length with the patient today.  Concerns regarding medicines are outlined above.  Medication changes, Labs and Tests ordered today are listed in the Patient Instructions below. Patient Instructions  Medication Instructions:   STOP TAKING PLAVIX NOW    Testing/Procedures:  Your physician has requested that you have a  upper extremity arterial duplex. This test is an ultrasound of the arteries in the legs or arms. It looks at arterial blood flow in the legs and arms. Allow one hour for Lower and Upper Arterial scans. There are no restrictions or special instructions      Follow-Up:  3 MONTHS WITH DR Meda Coffee       If you need a refill on your cardiac medications before your next appointment, please call your pharmacy.      Signed, Ena Dawley, MD  09/30/2016 9:35 AM    [facility] [address], [place], [place]  [address] Phone: [phone]; Fax: [phone]

## 2016-10-04 ENCOUNTER — Inpatient Hospital Stay (HOSPITAL_COMMUNITY)
Admission: EM | Admit: 2016-10-04 | Discharge: 2016-10-06 | DRG: 392 | Disposition: A | Discharge status: Home / Self Care | Payer: Medicare Other | Attending: Internal Medicine | Admitting: Internal Medicine

## 2016-10-04 ENCOUNTER — Emergency Department (HOSPITAL_COMMUNITY): Payer: Medicare Other

## 2016-10-04 ENCOUNTER — Encounter (HOSPITAL_COMMUNITY): Payer: Self-pay | Admitting: Emergency Medicine

## 2016-10-04 DIAGNOSIS — Z955 Presence of coronary angioplasty implant and graft: Secondary | ICD-10-CM

## 2016-10-04 DIAGNOSIS — M1990 Unspecified osteoarthritis, unspecified site: Secondary | ICD-10-CM | POA: Diagnosis present

## 2016-10-04 DIAGNOSIS — Z882 Allergy status to sulfonamides status: Secondary | ICD-10-CM

## 2016-10-04 DIAGNOSIS — I255 Ischemic cardiomyopathy: Secondary | ICD-10-CM | POA: Diagnosis present

## 2016-10-04 DIAGNOSIS — I2510 Atherosclerotic heart disease of native coronary artery without angina pectoris: Secondary | ICD-10-CM | POA: Diagnosis present

## 2016-10-04 DIAGNOSIS — Z885 Allergy status to narcotic agent status: Secondary | ICD-10-CM

## 2016-10-04 DIAGNOSIS — E1165 Type 2 diabetes mellitus with hyperglycemia: Secondary | ICD-10-CM | POA: Diagnosis present

## 2016-10-04 DIAGNOSIS — G8929 Other chronic pain: Secondary | ICD-10-CM | POA: Diagnosis present

## 2016-10-04 DIAGNOSIS — K5750 Diverticulosis of both small and large intestine without perforation or abscess without bleeding: Secondary | ICD-10-CM | POA: Diagnosis present

## 2016-10-04 DIAGNOSIS — R112 Nausea with vomiting, unspecified: Secondary | ICD-10-CM | POA: Diagnosis not present

## 2016-10-04 DIAGNOSIS — E1151 Type 2 diabetes mellitus with diabetic peripheral angiopathy without gangrene: Secondary | ICD-10-CM | POA: Diagnosis present

## 2016-10-04 DIAGNOSIS — K7689 Other specified diseases of liver: Secondary | ICD-10-CM | POA: Diagnosis present

## 2016-10-04 DIAGNOSIS — Z8673 Personal history of transient ischemic attack (TIA), and cerebral infarction without residual deficits: Secondary | ICD-10-CM

## 2016-10-04 DIAGNOSIS — Z9109 Other allergy status, other than to drugs and biological substances: Secondary | ICD-10-CM

## 2016-10-04 DIAGNOSIS — Z9221 Personal history of antineoplastic chemotherapy: Secondary | ICD-10-CM

## 2016-10-04 DIAGNOSIS — Z9884 Bariatric surgery status: Secondary | ICD-10-CM | POA: Diagnosis not present

## 2016-10-04 DIAGNOSIS — N179 Acute kidney failure, unspecified: Secondary | ICD-10-CM | POA: Diagnosis present

## 2016-10-04 DIAGNOSIS — D649 Anemia, unspecified: Secondary | ICD-10-CM | POA: Diagnosis present

## 2016-10-04 DIAGNOSIS — C9000 Multiple myeloma not having achieved remission: Secondary | ICD-10-CM | POA: Diagnosis present

## 2016-10-04 DIAGNOSIS — I70209 Unspecified atherosclerosis of native arteries of extremities, unspecified extremity: Secondary | ICD-10-CM | POA: Diagnosis present

## 2016-10-04 DIAGNOSIS — I252 Old myocardial infarction: Secondary | ICD-10-CM

## 2016-10-04 DIAGNOSIS — R110 Nausea: Secondary | ICD-10-CM | POA: Diagnosis present

## 2016-10-04 DIAGNOSIS — Z90710 Acquired absence of both cervix and uterus: Secondary | ICD-10-CM

## 2016-10-04 DIAGNOSIS — Z888 Allergy status to other drugs, medicaments and biological substances status: Secondary | ICD-10-CM

## 2016-10-04 DIAGNOSIS — I10 Essential (primary) hypertension: Secondary | ICD-10-CM | POA: Diagnosis present

## 2016-10-04 DIAGNOSIS — E7800 Pure hypercholesterolemia, unspecified: Secondary | ICD-10-CM | POA: Diagnosis present

## 2016-10-04 DIAGNOSIS — G620 Drug-induced polyneuropathy: Secondary | ICD-10-CM | POA: Diagnosis present

## 2016-10-04 DIAGNOSIS — Z7984 Long term (current) use of oral hypoglycemic drugs: Secondary | ICD-10-CM

## 2016-10-04 DIAGNOSIS — Z833 Family history of diabetes mellitus: Secondary | ICD-10-CM

## 2016-10-04 DIAGNOSIS — T451X5A Adverse effect of antineoplastic and immunosuppressive drugs, initial encounter: Secondary | ICD-10-CM | POA: Diagnosis present

## 2016-10-04 DIAGNOSIS — Z87442 Personal history of urinary calculi: Secondary | ICD-10-CM

## 2016-10-04 DIAGNOSIS — F419 Anxiety disorder, unspecified: Secondary | ICD-10-CM | POA: Diagnosis present

## 2016-10-04 DIAGNOSIS — Z7982 Long term (current) use of aspirin: Secondary | ICD-10-CM

## 2016-10-04 DIAGNOSIS — E119 Type 2 diabetes mellitus without complications: Secondary | ICD-10-CM

## 2016-10-04 DIAGNOSIS — Z951 Presence of aortocoronary bypass graft: Secondary | ICD-10-CM

## 2016-10-04 DIAGNOSIS — Z87440 Personal history of urinary (tract) infections: Secondary | ICD-10-CM

## 2016-10-04 DIAGNOSIS — Z8249 Family history of ischemic heart disease and other diseases of the circulatory system: Secondary | ICD-10-CM

## 2016-10-04 DIAGNOSIS — Z880 Allergy status to penicillin: Secondary | ICD-10-CM

## 2016-10-04 DIAGNOSIS — Z923 Personal history of irradiation: Secondary | ICD-10-CM

## 2016-10-04 DIAGNOSIS — Z79899 Other long term (current) drug therapy: Secondary | ICD-10-CM

## 2016-10-04 LAB — CBC
HCT: 39 % (ref 36.0–46.0)
HEMOGLOBIN: 12.3 g/dL (ref 12.0–15.0)
MCH: 26.7 pg (ref 26.0–34.0)
MCHC: 31.5 g/dL (ref 30.0–36.0)
MCV: 84.8 fL (ref 78.0–100.0)
PLATELETS: 209 10*3/uL (ref 150–400)
RBC: 4.6 MIL/uL (ref 3.87–5.11)
RDW: 15.1 % (ref 11.5–15.5)
WBC: 8.7 10*3/uL (ref 4.0–10.5)

## 2016-10-04 LAB — URINALYSIS, ROUTINE W REFLEX MICROSCOPIC
BILIRUBIN URINE: NEGATIVE
GLUCOSE, UA: NEGATIVE mg/dL
Hgb urine dipstick: NEGATIVE
KETONES UR: 20 mg/dL — AB
Nitrite: NEGATIVE
PH: 5 (ref 5.0–8.0)
Protein, ur: NEGATIVE mg/dL
Specific Gravity, Urine: 1.015 (ref 1.005–1.030)

## 2016-10-04 LAB — CBG MONITORING, ED: Glucose-Capillary: 116 mg/dL — ABNORMAL HIGH (ref 65–99)

## 2016-10-04 LAB — COMPREHENSIVE METABOLIC PANEL
ALBUMIN: 4 g/dL (ref 3.5–5.0)
ALK PHOS: 44 U/L (ref 38–126)
ALT: 14 U/L (ref 14–54)
AST: 21 U/L (ref 15–41)
Anion gap: 10 (ref 5–15)
BUN: 11 mg/dL (ref 6–20)
CALCIUM: 9.4 mg/dL (ref 8.9–10.3)
CHLORIDE: 101 mmol/L (ref 101–111)
CO2: 26 mmol/L (ref 22–32)
CREATININE: 1.14 mg/dL — AB (ref 0.44–1.00)
GFR calc Af Amer: 53 mL/min — ABNORMAL LOW (ref >60)
GFR calc non Af Amer: 46 mL/min — ABNORMAL LOW (ref >60)
GLUCOSE: 190 mg/dL — AB (ref 65–99)
Potassium: 4.2 mmol/L (ref 3.5–5.1)
SODIUM: 137 mmol/L (ref 135–145)
Total Bilirubin: 0.8 mg/dL (ref 0.3–1.2)
Total Protein: 6.8 g/dL (ref 6.5–8.1)

## 2016-10-04 LAB — LIPASE, BLOOD: LIPASE: 24 U/L (ref 11–51)

## 2016-10-04 MED ORDER — SODIUM CHLORIDE 0.9 % IV BOLUS (SEPSIS)
1000.0000 mL | Freq: Once | INTRAVENOUS | Status: AC
  Administered 2016-10-04: 1000 mL via INTRAVENOUS

## 2016-10-04 MED ORDER — INSULIN ASPART 100 UNIT/ML ~~LOC~~ SOLN
0.0000 [IU] | SUBCUTANEOUS | Status: DC
  Administered 2016-10-05 (×2): 2 [IU] via SUBCUTANEOUS
  Administered 2016-10-06: 1 [IU] via SUBCUTANEOUS
  Administered 2016-10-06: 2 [IU] via SUBCUTANEOUS
  Administered 2016-10-06: 1 [IU] via SUBCUTANEOUS

## 2016-10-04 MED ORDER — DICLOFENAC SODIUM 1 % TD GEL
2.0000 g | Freq: Every day | TRANSDERMAL | Status: DC | PRN
  Filled 2016-10-04: qty 100

## 2016-10-04 MED ORDER — SODIUM CHLORIDE 0.9 % IV SOLN
INTRAVENOUS | Status: DC
  Administered 2016-10-05 – 2016-10-06 (×3): via INTRAVENOUS

## 2016-10-04 MED ORDER — PANTOPRAZOLE SODIUM 40 MG IV SOLR
40.0000 mg | INTRAVENOUS | Status: DC
  Administered 2016-10-04 – 2016-10-05 (×2): 40 mg via INTRAVENOUS
  Filled 2016-10-04 (×2): qty 40

## 2016-10-04 MED ORDER — FENTANYL 12 MCG/HR TD PT72: 50.0000 ug | MEDICATED_PATCH | TRANSDERMAL | Status: DC

## 2016-10-04 MED ORDER — ONDANSETRON HCL 4 MG/2ML IJ SOLN
4.0000 mg | Freq: Once | INTRAMUSCULAR | Status: AC
  Administered 2016-10-05: 4 mg via INTRAVENOUS
  Filled 2016-10-04 (×2): qty 2

## 2016-10-04 MED ORDER — ENOXAPARIN SODIUM 40 MG/0.4ML ~~LOC~~ SOLN
40.0000 mg | SUBCUTANEOUS | Status: DC
  Administered 2016-10-04 – 2016-10-05 (×2): 40 mg via SUBCUTANEOUS
  Filled 2016-10-04 (×2): qty 0.4

## 2016-10-04 MED ORDER — FENTANYL 12 MCG/HR TD PT72
50.0000 ug | MEDICATED_PATCH | TRANSDERMAL | Status: DC
  Administered 2016-10-04: 50 ug via TRANSDERMAL
  Filled 2016-10-04 (×2): qty 4

## 2016-10-04 MED ORDER — HALOPERIDOL LACTATE 5 MG/ML IJ SOLN
2.0000 mg | Freq: Once | INTRAMUSCULAR | Status: DC
  Filled 2016-10-04: qty 1

## 2016-10-04 MED ORDER — PROMETHAZINE HCL 25 MG/ML IJ SOLN: 12.5000 mg | Freq: Once | INTRAMUSCULAR | Status: DC

## 2016-10-04 MED ORDER — ISOSORBIDE MONONITRATE ER 60 MG PO TB24
60.0000 mg | ORAL_TABLET | Freq: Every day | ORAL | Status: DC
  Administered 2016-10-04 – 2016-10-06 (×2): 60 mg via ORAL
  Filled 2016-10-04 (×2): qty 1

## 2016-10-04 MED ORDER — PROMETHAZINE HCL 25 MG/ML IJ SOLN: 12.5000 mg | Freq: Four times a day (QID) | INTRAMUSCULAR | Status: DC | PRN

## 2016-10-04 NOTE — H&P (Signed)
History and Physical    [name] BPZ:[id number] DOB: [birth year] DOA: 10/04/2016  Referring MD/NP/PA: er PCP: Lynnell Jude, MD Outpatient Specialists: Ardis Hughs Patient coming from: home  Chief Complaint: N/V  HPI: [name] is a 76 y.o. female with medical history significant of CAD, HTN, DM and lap band surgery at [facility] > 10 years ago.  She was recently hospitalized at [facility] with intractable N/V.  She was placed on IV reglan and states that she was able to eat as long as she was getting that medication.  She was to have a diabetic gastroparesis study done but was not made NPO so this was to be arranged as an outpatient.  She returns here with persistent nausea and dry heaving.  She has not followed up with her lap band surgeon at [facility] in a number of years. [name] has lost 11lbs in the last 11 days.  She had an EGD done in March of 2018 with Dr. Ardis Hughs: erosion at the site of her lap band and gastritis.  She states she had also had a colonoscopy before showing diverticulosis.  In the ER, she had continued nausea and inability to eat so she will be placed in observation.  Labs unremarkable: normal lipase, elevated blood sugar.  Denies fevers, has chills on and off, denies urinary pain.   Review of Systems: all systems reviewed, negative unless stated above in HPI   Past Medical History:  Diagnosis Date  . Anginal pain ([place])   . Anxiety   . Arthritis    "shoulders primarily" (01/13/2016)  . CAD (coronary artery disease)    s/p CABG in Oct 2018  . CHF (congestive heart failure) ([place])   . Chronic pain    from her multiple myeloma but says that her pain is usually in her legs/notes 01/13/2016  . High cholesterol   . History of blood transfusion    "numerous; related to procedures for my heart" (01/13/2016)  . Hypertension   . Ischemic cardiomyopathy    /nots 01/13/2016  . MI (myocardial infarction) ([place])    EKG on arrival 01/13/2016 showed NSR with evidence of old anterior and  inferior infarcts  . MI (myocardial infarction) ([place]) 08/2015  . Multiple myeloma ([place]) dx'd 2015   off chemo since 2016  . Nausea and vomiting 05/10/2016   3-4 month hx of nausea and vomiting  . PAD (peripheral artery disease) ([place])   . Stroke [facility])    "several in 1 year; ? year" (01/13/2016)  . Type II diabetes mellitus ([place])     Past Surgical History:  Procedure Laterality Date  . CARDIAC CATHETERIZATION N/A 01/14/2016   Procedure: Left Heart Cath and Cors/Grafts Angiography;  Surgeon: Burnell Blanks, MD;  Location: [facility];  Service: Cardiovascular;  Laterality: N/A;  . CARPAL TUNNEL RELEASE Left   . CORONARY ANGIOPLASTY WITH STENT PLACEMENT  05/2015   DES placed to the mid and distal LAD as well as OM2/notes 01/13/2016  . CORONARY ANGIOPLASTY WITH STENT PLACEMENT  ~ 2010  . CORONARY ARTERY BYPASS GRAFT  11/11/2015   CABG X2 LIMA-LAD and SVG-OM/notes 01/13/2016  . DILATION AND CURETTAGE OF UTERUS    . FEMORAL-POPLITEAL BYPASS GRAFT Left 2013   Archie Endo 01/13/2016  . LAPAROSCOPIC GASTRIC BANDING  2007  . RENAL ARTERY STENT    . VAGINAL HYSTERECTOMY       reports that she has never smoked. She has never used smokeless tobacco. She reports that she  drinks alcohol. She reports that she does not use drugs.  Allergies  Allergen Reactions  . Other Swelling    Smoke--Head swelling  . Statins Swelling  . Amoxicillin   . Codeine   . Sulfa Antibiotics   . Duloxetine Nausea Only    Family History  Problem Relation Age of Onset  . Diabetes Mother   . Heart failure Mother   . Prostate cancer Neg Hx   . Kidney cancer Neg Hx   . Bladder Cancer Neg Hx      Prior to Admission medications   Medication Sig Start Date End Date Taking? Authorizing Provider  Alirocumab (PRALUENT) 75 MG/ML SOPN Inject 1 pen into the skin every 14 (fourteen) days. STUDY MEDICATION 05/06/16  Yes Dorothy Spark, MD  aspirin 81 MG tablet Take 81 mg by mouth daily.   Yes [provider]  Cholecalciferol (VITAMIN D3) 50000 units CAPS Take 1 capsule by mouth 2 (two) times a week. Sunday AND Wednesday   Yes [provider]  diclofenac sodium (VOLTAREN) 1 % GEL Apply 2 g topically daily as needed for pain.   Yes [provider]  fentaNYL (DURAGESIC - DOSED MCG/HR) 50 MCG/HR Place 50 mcg onto the skin every 3 (three) days.    Yes [provider]  fluticasone (FLONASE) 50 MCG/ACT nasal spray Place 2 sprays into both nostrils daily as needed for allergies.  03/08/16  Yes [provider]  furosemide (LASIX) 20 MG tablet Take 1 tablet (20 mg total) by mouth daily as needed for fluid or edema. 05/10/16 10/04/16 Yes Dorothy Spark, MD  gabapentin (NEURONTIN) 300 MG capsule Take 300-600 mg by mouth See admin instructions. Pt takes 61m in am, 3034mat bedtime   Yes [provider]  hydrocortisone cream 1 % Apply topically as needed for itching. 01/15/16  Yes SmJettie Booze, NP  HYDROmorphone (DILAUDID) 2 MG tablet Take 2 mg by mouth every 4 (four) hours as needed for severe pain.   Yes [provider]  isosorbide mononitrate (IMDUR) 60 MG 24 hr tablet Take 1 tablet (60 mg total) by mouth daily. 05/10/16 10/04/16 Yes NeDorothy SparkMD  lisinopril (PRINIVIL,ZESTRIL) 5 MG tablet Take 1 tablet (5 mg total) by mouth daily. 01/16/16  Yes SmArbutus LeasNP  metFORMIN (GLUCOPHAGE) 500 MG tablet Take 500 mg by mouth 2 (two) times daily with a meal.    Yes [provider]  nitroGLYCERIN (NITROSTAT) 0.4 MG SL tablet Place 1 tablet (0.4 mg total) under the tongue every 5 (five) minutes as needed for chest pain. 01/15/16  Yes SmArbutus LeasNP  Omega-3 Fatty Acids (FISH OIL) 1000 MG CAPS Take 2 capsules (2,000 mg total) by mouth daily. Patient taking differently: Take 1,000 mg by mouth daily.  05/06/16  Yes NeDorothy SparkMD  promethazine (PHENERGAN) 12.5 MG tablet Take 1-2 every 6 hours prn for nausea 04/06/16  Yes JaMilus BanisterMD    senna-docusate (SENOKOT-S) 8.6-50 MG tablet Take 2 tablets by mouth 2 (two) times daily as needed for constipation. 11/17/15 11/16/16 Yes [provider]  alfuzosin (UROXATRAL) 10 MG 24 hr tablet Take 1 tablet (10 mg total) by mouth daily with breakfast. 03/30/16   EsFestus AloeMD  famotidine (PEPCID) 20 MG tablet Take 1 tablet (20 mg total) by mouth 2 (two) times daily. 09/17/16 09/17/17  VeRudene ReMD  metoprolol tartrate (LOPRESSOR) 25 MG tablet Take 0.5 tablets (12.5 mg total) by mouth  2 (two) times daily. 02/23/16 09/17/16  Dorothy Spark, MD  ondansetron (ZOFRAN-ODT) 4 MG disintegrating tablet Take 1 tablet (4 mg total) by mouth every 8 (eight) hours as needed for nausea or vomiting. 09/21/16   Henreitta Leber, MD  promethazine (PHENERGAN) 12.5 MG tablet Take 1 tablet (12.5 mg total) by mouth every 6 (six) hours as needed for nausea or vomiting. 09/21/16   Henreitta Leber, MD    Physical Exam: Vitals:   10/04/16 0942 10/04/16 1159 10/04/16 1424  BP: (!) 141/58 (!) 108/58 (!) 149/61  Pulse: 89 89 89  Resp: 18 12   Temp: 98.3 F (36.8 C) 98.5 F (36.9 C)   TempSrc: Oral Oral   SpO2: 98% 99% 99%      Constitutional: chronically ill appearing female Vitals:   10/04/16 0942 10/04/16 1159 10/04/16 1424  BP: (!) 141/58 (!) 108/58 (!) 149/61  Pulse: 89 89 89  Resp: 18 12   Temp: 98.3 F (36.8 C) 98.5 F (36.9 C)   TempSrc: Oral Oral   SpO2: 98% 99% 99%   Eyes: PERRL, lids and conjunctivae normal, NAD but does not appear comfortable ENMT: Mucous membranes are moist. Posterior pharynx clear of any exudate or lesions- no thrush noted Neck: normal, supple, no masses, no thyromegaly Respiratory: clear to auscultation bilaterally, no wheezing, no crackles. Normal respiratory effort. No accessory muscle use.  Cardiovascular: Regular rate and rhythm, no murmurs / rubs / gallops. No extremity edema. 2+ pedal pulses. No carotid bruits.  Abdomen: no tenderness, no  masses palpated. No hepatosplenomegaly. Bowel sounds positive.  Musculoskeletal: no clubbing / cyanosis. No joint deformity upper and lower extremities. Good ROM, no contractures. Normal muscle tone.  Skin: scar on abdomen Neurologic: CN 2-12 grossly intact. Sensation intact, DTR normal. Strength 5/5 in all 4.  Psychiatric: Normal judgment and insight. Alert and oriented x 3. Normal mood.    Labs on Admission: I have personally reviewed following labs and imaging studies  CBC:  Recent Labs Lab 10/04/16 0955  WBC 8.7  HGB 12.3  HCT 39.0  MCV 84.8  PLT 080   Basic Metabolic Panel:  Recent Labs Lab 10/04/16 0955  NA 137  K 4.2  CL 101  CO2 26  GLUCOSE 190*  BUN 11  CREATININE 1.14*  CALCIUM 9.4   GFR: Estimated Creatinine Clearance: 40.1 mL/min (A) (by C-G formula based on SCr of 1.14 mg/dL (H)). Liver Function Tests:  Recent Labs Lab 10/04/16 0955  AST 21  ALT 14  ALKPHOS 44  BILITOT 0.8  PROT 6.8  ALBUMIN 4.0    Recent Labs Lab 10/04/16 0955  LIPASE 24   No results for input(s): AMMONIA in the last 168 hours. Coagulation Profile: No results for input(s): INR, PROTIME in the last 168 hours. Cardiac Enzymes: No results for input(s): CKTOTAL, CKMB, CKMBINDEX, TROPONINI in the last 168 hours. BNP (last 3 results) No results for input(s): PROBNP in the last 8760 hours. HbA1C: No results for input(s): HGBA1C in the last 72 hours. CBG: No results for input(s): GLUCAP in the last 168 hours. Lipid Profile: No results for input(s): CHOL, HDL, LDLCALC, TRIG, CHOLHDL, LDLDIRECT in the last 72 hours. Thyroid Function Tests: No results for input(s): TSH, T4TOTAL, FREET4, T3FREE, THYROIDAB in the last 72 hours. Anemia Panel: No results for input(s): VITAMINB12, FOLATE, FERRITIN, TIBC, IRON, RETICCTPCT in the last 72 hours. Urine analysis:    Component Value Date/Time   COLORURINE YELLOW 10/04/2016 1420   APPEARANCEUR HAZY (A)  10/04/2016 1420   APPEARANCEUR  Clear 04/05/2016 0857   LABSPEC 1.015 10/04/2016 1420   PHURINE 5.0 10/04/2016 1420   GLUCOSEU NEGATIVE 10/04/2016 1420   HGBUR NEGATIVE 10/04/2016 1420   BILIRUBINUR NEGATIVE 10/04/2016 1420   BILIRUBINUR Negative 04/05/2016 0857   KETONESUR 20 (A) 10/04/2016 1420   PROTEINUR NEGATIVE 10/04/2016 1420   NITRITE NEGATIVE 10/04/2016 1420   LEUKOCYTESUR TRACE (A) 10/04/2016 1420   LEUKOCYTESUR Negative 04/05/2016 0857   Sepsis Labs: Invalid input(s): PROCALCITONIN, LACTICIDVEN No results found for this or any previous visit (from the past 240 hour(s)).   Radiological Exams on Admission: Dg Abdomen Acute W/chest  Result Date: 10/04/2016 CLINICAL DATA:  Nausea for 1 month EXAM: DG ABDOMEN ACUTE W/ 1V CHEST COMPARISON:  09/12/2016, CT 09/12/2016 FINDINGS: Single-view chest demonstrates post sternotomy changes. Normal cardiomediastinal silhouette with atherosclerosis. No pneumothorax. Supine and upright views of the abdomen demonstrate gastric band. No free air beneath the diaphragm. Nonobstructed bowel-gas pattern. Mild scoliosis of the spine. Calcified phleboliths in the pelvis. Surgical clips in the left groin and partially visualized stent in the right groin. IMPRESSION: 1. No radiographic evidence for acute cardiopulmonary abnormality. 2. Nonobstructed bowel-gas pattern Electronically Signed   By: Donavan Foil M.D.   On: 10/04/2016 16:24    EKG: Independently reviewed. pending  Assessment/Plan Active Problems:   Nausea & vomiting  Uncontrolled N/V -suspect diabetic gastroparesis -better at [facility] with reglan (will hold in case of Gastric emptying study) -will defer nuclear study vs EGD to GI -consult placed in ER to Dr. Henrene Pastor -IVF overnight -NPO -PRN phnergan  Chronic pain -on fentayl patch -denies constipation  S/p gastric banding -? Need to follow up with surgeon for band changes/removal    DVT prophylaxis: lovenox Code Status: full Family Communication: at  bedside Disposition Plan: home when work up complete Consults called: GIHenrene Pastor Admission status: obs- med surge   [place] DO [facility] Pager [phone]  If 7PM-7AM, please contact night-coverage [email or web address] Password [facility]  10/04/2016, 5:38 PM

## 2016-10-04 NOTE — ED Triage Notes (Signed)
Pt to ER for nausea x1 month. States was admitted to [facility] recently for 5 days without any diagnosis. Was given GI follow up and states is unable to wait for appointment due to persistent nausea with phenergan. Denies emesis. Reports poor intake. NAD at triage.

## 2016-10-04 NOTE — ED Provider Notes (Signed)
[facility] DEPT Provider Note   CSN: [id number] Arrival date & time: 10/04/16  0909     History   Chief Complaint Chief Complaint  Patient presents with  . Nausea  . Emesis    HPI [name] is a 76 y.o. female with PMH/o gastric band who presents with persistent nausea/vomiting. She denies any changes or worsening in symptoms but states that she has not been able to be seen by GI probably ED visit. Patient reports that this has been an ongoing issue for several months. She has been evaluated for it multiple times with no Conclusive finding. Patient was recently admitted to [facility] 5 days ago. She had a GI consult but no definitive diagnosis. Patient was told to arrange GI appointment outpatient. Patient reports that the earliest she can get in a 10/11/16 which patient does not feel sooner. Patient reports that she has persistent dry heaving anytime that she attempts to eat or drink. Patient states that she can tolerate liquids but states when she attempts to eat solid foods, she vomits it back up. Patient denies any blood in the emesis. Patient has been taking Phenergan at home with minimal improvement. Patient ports that she has intermittent abdominal pain denies any current abdominal pain. Patient does have a history of abdominal surgery. Patient reports that she has not had a bowel movement last 2 days but reports that she has been able to pass a small amount of flatus. Patient denies any fevers, chills, chest pain, SOB, dysuria, hematuria.  The history is provided by the patient.    Past Medical History:  Diagnosis Date  . Anginal pain ([place])   . Anxiety   . Arthritis    "shoulders primarily" (01/13/2016)  . CAD (coronary artery disease)    s/p CABG in Oct 2018  . CHF (congestive heart failure) ([place])   . Chronic pain    from her multiple myeloma but says that her pain is usually in her legs/notes 01/13/2016  . High cholesterol   . History of blood  transfusion    "numerous; related to procedures for my heart" (01/13/2016)  . Hypertension   . Ischemic cardiomyopathy    /nots 01/13/2016  . MI (myocardial infarction) ([place])    EKG on arrival 01/13/2016 showed NSR with evidence of old anterior and inferior infarcts  . MI (myocardial infarction) ([place]) 08/2015  . Multiple myeloma ([place]) dx'd 2015   off chemo since 2016  . Nausea and vomiting 05/10/2016   3-4 month hx of nausea and vomiting  . PAD (peripheral artery disease) ([place])   . Stroke [facility])    "several in 1 year; ? year" (01/13/2016)  . Type II diabetes mellitus [facility])     Patient Active Problem List   Diagnosis Date Noted  . Nausea & vomiting 10/04/2016  . Fistula 09/30/2016  . Intractable nausea and vomiting 09/18/2016  . Lower extremity edema 05/10/2016  . Ischemic cardiomyopathy 02/23/2016  . Chills 02/23/2016  . Weight loss 02/23/2016  . Unstable angina pectoris ([place]) 01/13/2016  . Chest pain   . PVD (peripheral vascular disease) ([place])   . Coronary artery disease involving coronary bypass graft of native heart with angina pectoris ([place])   . Peripheral arterial occlusive disease ([place]) 12/15/2015  . Acute blood loss anemia 11/13/2015  . Leukocytosis 11/13/2015  . Chronic pain 11/12/2015  . Acute postoperative pain 11/12/2015  . 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 Acute UTI 11/05/2015  . Edema of left lower  extremity 06/16/2015  . Edema of upper extremity 06/16/2015  . Knee pain, left 06/14/2015  . AKI (acute kidney injury) ([place]) 06/11/2015  . Hyperkalemia 06/11/2015  . Hypomagnesemia 06/11/2015  . Acute leg pain, left 05/20/2015  . Multiple bruises 05/20/2015  . Recurrent urinary tract infection 03/26/2014  . Recurrent UTI 03/26/2014  . H/O gastric bypass 11/07/2013  . Peripheral neuropathy 10/10/2013  . Encounter for antineoplastic chemotherapy 10/10/2013  . Multiple myeloma ([place]) 07/16/2013  . Critical ischemia of lower extremity 05/15/2013  . Critical lower limb ischemia  05/15/2013  . Intermittent claudication ([place]) 07/27/2011  . Chronic coronary artery disease 04/26/2011  . Gastroesophageal reflux disease 04/26/2011  . Type 2 diabetes mellitus ([place]) 03/30/2011  . Hyperlipidemia, unspecified 03/30/2011  . Hypertension 03/30/2011    Past Surgical History:  Procedure Laterality Date  . CARDIAC CATHETERIZATION N/A 01/14/2016   Procedure: Left Heart Cath and Cors/Grafts Angiography;  Surgeon: Burnell Blanks, MD;  Location: [facility] LAB;  Service: Cardiovascular;  Laterality: N/A;  . CARPAL TUNNEL RELEASE Left   . CORONARY ANGIOPLASTY WITH STENT PLACEMENT  05/2015   DES placed to the mid and distal LAD as well as OM2/notes 01/13/2016  . CORONARY ANGIOPLASTY WITH STENT PLACEMENT  ~ 2010  . CORONARY ARTERY BYPASS GRAFT  11/11/2015   CABG X2 LIMA-LAD and SVG-OM/notes 01/13/2016  . DILATION AND CURETTAGE OF UTERUS    . FEMORAL-POPLITEAL BYPASS GRAFT Left 2013   Archie Endo 01/13/2016  . LAPAROSCOPIC GASTRIC BANDING  2007  . RENAL ARTERY STENT    . VAGINAL HYSTERECTOMY      OB History    No data available       Home Medications    Prior to Admission medications   Medication Sig Start Date End Date Taking? Authorizing Provider  Alirocumab (PRALUENT) 75 MG/ML SOPN Inject 1 pen into the skin every 14 (fourteen) days. STUDY MEDICATION 05/06/16  Yes Dorothy Spark, MD  aspirin 81 MG tablet Take 81 mg by mouth daily.   Yes [provider]  Cholecalciferol (VITAMIN D3) 50000 units CAPS Take 1 capsule by mouth 2 (two) times a week. Sunday AND Wednesday   Yes [provider]  diclofenac sodium (VOLTAREN) 1 % GEL Apply 2 g topically daily as needed for pain.   Yes [provider]  fentaNYL (DURAGESIC - DOSED MCG/HR) 50 MCG/HR Place 50 mcg onto the skin every 3 (three) days.    Yes [provider]  fluticasone (FLONASE) 50 MCG/ACT nasal spray Place 2 sprays into both nostrils daily as needed for allergies.  03/08/16  Yes  [provider]  furosemide (LASIX) 20 MG tablet Take 1 tablet (20 mg total) by mouth daily as needed for fluid or edema. 05/10/16 10/04/16 Yes Dorothy Spark, MD  gabapentin (NEURONTIN) 300 MG capsule Take 300-600 mg by mouth See admin instructions. Pt takes 623m in am, 3074mat bedtime   Yes [provider]  hydrocortisone cream 1 % Apply topically as needed for itching. 01/15/16  Yes SmJettie Booze, NP  HYDROmorphone (DILAUDID) 2 MG tablet Take 2 mg by mouth every 4 (four) hours as needed for severe pain.   Yes [provider]  isosorbide mononitrate (IMDUR) 60 MG 24 hr tablet Take 1 tablet (60 mg total) by mouth daily. 05/10/16 10/04/16 Yes NeDorothy SparkMD  lisinopril (PRINIVIL,ZESTRIL) 5 MG tablet Take 1 tablet (5 mg total) by mouth daily. 01/16/16  Yes SmArbutus LeasNP  metFORMIN (GLUCOPHAGE) 500  MG tablet Take 500 mg by mouth 2 (two) times daily with a meal.    Yes [provider]  nitroGLYCERIN (NITROSTAT) 0.4 MG SL tablet Place 1 tablet (0.4 mg total) under the tongue every 5 (five) minutes as needed for chest pain. 01/15/16  Yes Arbutus Leas, NP  Omega-3 Fatty Acids (FISH OIL) 1000 MG CAPS Take 2 capsules (2,000 mg total) by mouth daily. Patient taking differently: Take 1,000 mg by mouth daily.  05/06/16  Yes Dorothy Spark, MD  promethazine (PHENERGAN) 12.5 MG tablet Take 1-2 every 6 hours prn for nausea 04/06/16  Yes Milus Banister, MD  senna-docusate (SENOKOT-S) 8.6-50 MG tablet Take 2 tablets by mouth 2 (two) times daily as needed for constipation. 11/17/15 11/16/16 Yes [provider]  alfuzosin (UROXATRAL) 10 MG 24 hr tablet Take 1 tablet (10 mg total) by mouth daily with breakfast. 03/30/16   Festus Aloe, MD  famotidine (PEPCID) 20 MG tablet Take 1 tablet (20 mg total) by mouth 2 (two) times daily. 09/17/16 09/17/17  Rudene Re, MD  metoprolol tartrate (LOPRESSOR) 25 MG tablet Take 0.5 tablets (12.5 mg total) by mouth 2 (two)  times daily. 02/23/16 09/17/16  Dorothy Spark, MD  ondansetron (ZOFRAN-ODT) 4 MG disintegrating tablet Take 1 tablet (4 mg total) by mouth every 8 (eight) hours as needed for nausea or vomiting. 09/21/16   Henreitta Leber, MD  promethazine (PHENERGAN) 12.5 MG tablet Take 1 tablet (12.5 mg total) by mouth every 6 (six) hours as needed for nausea or vomiting. 09/21/16   Henreitta Leber, MD    Family History Family History  Problem Relation Age of Onset  . Diabetes Mother   . Heart failure Mother   . Prostate cancer Neg Hx   . Kidney cancer Neg Hx   . Bladder Cancer Neg Hx     Social History Social History  Substance Use Topics  . Smoking status: Never Smoker  . Smokeless tobacco: Never Used  . Alcohol use Yes     Comment: occasional wine     Allergies   Other; Statins; Amoxicillin; Codeine; Sulfa antibiotics; and Duloxetine   Review of Systems Review of Systems  Constitutional: Negative for chills and fever.  Respiratory: Negative for shortness of breath.   Cardiovascular: Negative for chest pain.  Gastrointestinal: Positive for abdominal pain, nausea and vomiting. Negative for diarrhea.  Genitourinary: Negative for dysuria and hematuria.  Skin: Negative for rash.     Physical Exam Updated Vital Signs BP (!) 149/61   Pulse 89   Temp 98.5 F (36.9 C) (Oral)   Resp 12   SpO2 99%   Physical Exam  Constitutional: She is oriented to person, place, and time. She appears well-developed and well-nourished.  Appears uncomfortable but no acute distress   HENT:  Head: Normocephalic and atraumatic.  Mouth/Throat: Oropharynx is clear and moist and mucous membranes are normal.  Eyes: Pupils are equal, round, and reactive to light. Conjunctivae, EOM and lids are normal.  Neck: Full passive range of motion without pain.  Cardiovascular: Normal rate, regular rhythm, normal heart sounds and normal pulses.  Exam reveals no gallop and no friction rub.   No murmur  heard. Pulmonary/Chest: Effort normal and breath sounds normal.  Abdominal: Soft. Normal appearance. She exhibits no distension. Bowel sounds are decreased. There is no tenderness. There is no rigidity and no guarding.  Abdomen is soft, non-tender, non-distended. Well-healed surgical scars noted to the abdomen.  Musculoskeletal: Normal range of  motion.  Neurological: She is alert and oriented to person, place, and time.  Skin: Skin is warm and dry. Capillary refill takes less than 2 seconds.  Psychiatric: She has a normal mood and affect. Her speech is normal.  Nursing note and vitals reviewed.    ED Treatments / Results  Labs (all labs ordered are listed, but only abnormal results are displayed) Labs Reviewed  COMPREHENSIVE METABOLIC PANEL - Abnormal; Notable for the following:       Result Value   Glucose, Bld 190 (*)    Creatinine, Ser 1.14 (*)    GFR calc non Af Amer 46 (*)    GFR calc Af Amer 53 (*)    All other components within normal limits  URINALYSIS, ROUTINE W REFLEX MICROSCOPIC - Abnormal; Notable for the following:    APPearance HAZY (*)    Ketones, ur 20 (*)    Leukocytes, UA TRACE (*)    Bacteria, UA MANY (*)    Squamous Epithelial / LPF 0-5 (*)    All other components within normal limits  LIPASE, BLOOD  CBC    EKG  EKG Interpretation None       Radiology Dg Abdomen Acute W/chest  Result Date: 10/04/2016 CLINICAL DATA:  Nausea for 1 month EXAM: DG ABDOMEN ACUTE W/ 1V CHEST COMPARISON:  09/12/2016, CT 09/12/2016 FINDINGS: Single-view chest demonstrates post sternotomy changes. Normal cardiomediastinal silhouette with atherosclerosis. No pneumothorax. Supine and upright views of the abdomen demonstrate gastric band. No free air beneath the diaphragm. Nonobstructed bowel-gas pattern. Mild scoliosis of the spine. Calcified phleboliths in the pelvis. Surgical clips in the left groin and partially visualized stent in the right groin. IMPRESSION: 1. No  radiographic evidence for acute cardiopulmonary abnormality. 2. Nonobstructed bowel-gas pattern Electronically Signed   By: Donavan Foil M.D.   On: 10/04/2016 16:24    Procedures Procedures (including critical care time)  Medications Ordered in ED Medications  sodium chloride 0.9 % bolus 1,000 mL (not administered)  haloperidol lactate (HALDOL) injection 2 mg (not administered)  insulin aspart (novoLOG) injection 0-9 Units (not administered)     Initial Impression / Assessment and Plan / ED Course  I have reviewed the triage vital signs and the nursing notes.  Pertinent labs & imaging results that were available during my care of the patient were reviewed by me and considered in my medical decision making (see chart for details).     76 year old female with past medical history of gastric band who presents with persistent nausea and vomiting. This is been an ongoing issue for several months has become persistent last 5 weeks. She does have a history of a gastric band placed at [facility] 12 years ago. She does not currently follow up with [facility]. He is currently followed by lip or GI, Dr. Ardis Hughs. Patient has had multiple workups in the emergency department with here at [facility] with no conclusive finding. No new or worsening symptoms today but states that it is persistent. Also reports intermittent abdominal pain. Patient is afebrile, non-toxic appearing, sitting comfortably on examination table. Vital signs reviewed and stable. Initial labs ordered at triage.   Labs reviewed. Urinalysis shows trace leukocytes and bacteria. CMP is unremarkable. CBC is unremarkable. Lipase unremarkable. Since patient has been taking Phenergan and Zofran with no improvement, we'll plan to order an EKG and sent to give Haldol for nausea relief.  EKG reviewed. Patient has a normal QTC. Will plan to give IM Haldol for nausea relief.  Than that  patient has had persistent symptoms, will plan to page GI to see  if they can evaluate patient in the emergency department. Consult to GI placed.   Records reviewed. Patient was recently admitted to [facility] on 09/18/16. Ring that admission, she was evaluated by coming GI. There was mention of doing an EGD but it was eventually deferred to time and patient's recurred. Patient was scheduled to have a gastric emptying study but did not have completed. She was sent home on outpatient Protonix, Zofran and Phenergan.  Repeat GI consult placed.  Repeat GI consult placed.   Discussed with [name] Couch, PA [name]) regarding history and presentation. GI will not be able to come evaluate patient in the emergency department today. We reviewed the patient's chart extensively. Recommend attempting to do clear liquid diet, discontinuing Pepcid and starting patient on Protonix or omeprazole. Additionally, GI suggested placing patient on antibiotics for UA results. After extensive discussion given patient's age and risk factors, GI recommended having patient being admitted for symptom control with plan to have GI follow-up tomorrow during hospital admission. Will consult hospitalist.  Discussed patient with Dr. Eliseo Squires (hospitalist). Will admit patient for symptom control. Recommends repeating GI to get patient on scheduled tomorrow for evaluation.  Consult to GI placed.  Discussed patient with Dr. Henrene Pastor ([facility]). Aware of patient admission. Will plan to consult on patient tomorrow.   Final Clinical Impressions(s) / ED Diagnoses   Final diagnoses:  Non-intractable vomiting with nausea, unspecified vomiting type    New Prescriptions New Prescriptions   No medications on file     [name] 10/04/16 1746    [name] Dessert, MD 10/06/16 1409

## 2016-10-04 NOTE — ED Notes (Signed)
REpaged [facility]

## 2016-10-05 ENCOUNTER — Encounter (HOSPITAL_COMMUNITY): Payer: Self-pay | Admitting: Physician Assistant

## 2016-10-05 ENCOUNTER — Observation Stay (HOSPITAL_COMMUNITY): Payer: Medicare Other

## 2016-10-05 DIAGNOSIS — E1165 Type 2 diabetes mellitus with hyperglycemia: Secondary | ICD-10-CM | POA: Diagnosis present

## 2016-10-05 DIAGNOSIS — I252 Old myocardial infarction: Secondary | ICD-10-CM | POA: Diagnosis not present

## 2016-10-05 DIAGNOSIS — E1151 Type 2 diabetes mellitus with diabetic peripheral angiopathy without gangrene: Secondary | ICD-10-CM | POA: Diagnosis present

## 2016-10-05 DIAGNOSIS — I10 Essential (primary) hypertension: Secondary | ICD-10-CM | POA: Diagnosis present

## 2016-10-05 DIAGNOSIS — K5750 Diverticulosis of both small and large intestine without perforation or abscess without bleeding: Secondary | ICD-10-CM | POA: Diagnosis present

## 2016-10-05 DIAGNOSIS — G8929 Other chronic pain: Secondary | ICD-10-CM | POA: Diagnosis present

## 2016-10-05 DIAGNOSIS — I70209 Unspecified atherosclerosis of native arteries of extremities, unspecified extremity: Secondary | ICD-10-CM | POA: Diagnosis present

## 2016-10-05 DIAGNOSIS — D649 Anemia, unspecified: Secondary | ICD-10-CM | POA: Diagnosis present

## 2016-10-05 DIAGNOSIS — Z955 Presence of coronary angioplasty implant and graft: Secondary | ICD-10-CM | POA: Diagnosis not present

## 2016-10-05 DIAGNOSIS — E7800 Pure hypercholesterolemia, unspecified: Secondary | ICD-10-CM | POA: Diagnosis present

## 2016-10-05 DIAGNOSIS — R112 Nausea with vomiting, unspecified: Secondary | ICD-10-CM | POA: Diagnosis present

## 2016-10-05 DIAGNOSIS — N179 Acute kidney failure, unspecified: Secondary | ICD-10-CM | POA: Diagnosis present

## 2016-10-05 DIAGNOSIS — Z9884 Bariatric surgery status: Secondary | ICD-10-CM | POA: Diagnosis not present

## 2016-10-05 DIAGNOSIS — I255 Ischemic cardiomyopathy: Secondary | ICD-10-CM | POA: Diagnosis present

## 2016-10-05 DIAGNOSIS — T451X5A Adverse effect of antineoplastic and immunosuppressive drugs, initial encounter: Secondary | ICD-10-CM | POA: Diagnosis present

## 2016-10-05 DIAGNOSIS — F419 Anxiety disorder, unspecified: Secondary | ICD-10-CM | POA: Diagnosis present

## 2016-10-05 DIAGNOSIS — M1990 Unspecified osteoarthritis, unspecified site: Secondary | ICD-10-CM | POA: Diagnosis present

## 2016-10-05 DIAGNOSIS — C9000 Multiple myeloma not having achieved remission: Secondary | ICD-10-CM | POA: Diagnosis present

## 2016-10-05 DIAGNOSIS — Z951 Presence of aortocoronary bypass graft: Secondary | ICD-10-CM | POA: Diagnosis not present

## 2016-10-05 DIAGNOSIS — R110 Nausea: Secondary | ICD-10-CM | POA: Diagnosis present

## 2016-10-05 DIAGNOSIS — K7689 Other specified diseases of liver: Secondary | ICD-10-CM | POA: Diagnosis present

## 2016-10-05 DIAGNOSIS — E119 Type 2 diabetes mellitus without complications: Secondary | ICD-10-CM | POA: Diagnosis not present

## 2016-10-05 DIAGNOSIS — Z923 Personal history of irradiation: Secondary | ICD-10-CM | POA: Diagnosis not present

## 2016-10-05 DIAGNOSIS — I2510 Atherosclerotic heart disease of native coronary artery without angina pectoris: Secondary | ICD-10-CM | POA: Diagnosis present

## 2016-10-05 DIAGNOSIS — G620 Drug-induced polyneuropathy: Secondary | ICD-10-CM | POA: Diagnosis present

## 2016-10-05 DIAGNOSIS — Z90710 Acquired absence of both cervix and uterus: Secondary | ICD-10-CM | POA: Diagnosis not present

## 2016-10-05 LAB — CBC
HCT: 33.8 % — ABNORMAL LOW (ref 36.0–46.0)
Hemoglobin: 10.5 g/dL — ABNORMAL LOW (ref 12.0–15.0)
MCH: 26.5 pg (ref 26.0–34.0)
MCHC: 31.1 g/dL (ref 30.0–36.0)
MCV: 85.4 fL (ref 78.0–100.0)
PLATELETS: 168 10*3/uL (ref 150–400)
RBC: 3.96 MIL/uL (ref 3.87–5.11)
RDW: 15 % (ref 11.5–15.5)
WBC: 8.5 10*3/uL (ref 4.0–10.5)

## 2016-10-05 LAB — GLUCOSE, CAPILLARY
GLUCOSE-CAPILLARY: 103 mg/dL — AB (ref 65–99)
GLUCOSE-CAPILLARY: 112 mg/dL — AB (ref 65–99)
GLUCOSE-CAPILLARY: 156 mg/dL — AB (ref 65–99)
GLUCOSE-CAPILLARY: 156 mg/dL — AB (ref 65–99)
GLUCOSE-CAPILLARY: 96 mg/dL (ref 65–99)

## 2016-10-05 LAB — COMPREHENSIVE METABOLIC PANEL
ALK PHOS: 36 U/L — AB (ref 38–126)
ALT: 11 U/L — AB (ref 14–54)
AST: 14 U/L — ABNORMAL LOW (ref 15–41)
Albumin: 3.1 g/dL — ABNORMAL LOW (ref 3.5–5.0)
Anion gap: 9 (ref 5–15)
BUN: 11 mg/dL (ref 6–20)
CO2: 23 mmol/L (ref 22–32)
CREATININE: 1.11 mg/dL — AB (ref 0.44–1.00)
Calcium: 8.3 mg/dL — ABNORMAL LOW (ref 8.9–10.3)
Chloride: 105 mmol/L (ref 101–111)
GFR, EST AFRICAN AMERICAN: 55 mL/min — AB (ref >60)
GFR, EST NON AFRICAN AMERICAN: 47 mL/min — AB (ref >60)
Glucose, Bld: 107 mg/dL — ABNORMAL HIGH (ref 65–99)
Potassium: 3.7 mmol/L (ref 3.5–5.1)
SODIUM: 137 mmol/L (ref 135–145)
TOTAL PROTEIN: 5.3 g/dL — AB (ref 6.5–8.1)
Total Bilirubin: 1 mg/dL (ref 0.3–1.2)

## 2016-10-05 MED ORDER — ENSURE ENLIVE PO LIQD: 237.0000 mL | Freq: Two times a day (BID) | ORAL | Status: DC

## 2016-10-05 MED ORDER — FLUTICASONE PROPIONATE 50 MCG/ACT NA SUSP
2.0000 | Freq: Every day | NASAL | Status: DC
  Administered 2016-10-05 – 2016-10-06 (×2): 2 via NASAL
  Filled 2016-10-05: qty 16

## 2016-10-05 MED ORDER — METOCLOPRAMIDE HCL 5 MG/ML IJ SOLN
5.0000 mg | Freq: Four times a day (QID) | INTRAMUSCULAR | Status: DC
  Administered 2016-10-05 – 2016-10-06 (×3): 5 mg via INTRAVENOUS
  Filled 2016-10-05 (×3): qty 2

## 2016-10-05 MED ORDER — TECHNETIUM TC 99M SULFUR COLLOID
2.0000 | Freq: Once | INTRAVENOUS | Status: AC | PRN
  Administered 2016-10-05: 2 via INTRAVENOUS

## 2016-10-05 MED ORDER — PROMETHAZINE HCL 25 MG/ML IJ SOLN
12.5000 mg | Freq: Four times a day (QID) | INTRAMUSCULAR | Status: DC
  Administered 2016-10-05 – 2016-10-06 (×4): 12.5 mg via INTRAVENOUS
  Filled 2016-10-05 (×4): qty 1

## 2016-10-05 NOTE — Care Management Obs Status (Signed)
[facility] NOTIFICATION   Patient Details  Name: [name] MRN: [id number] Date of Birth: 1940-07-22   Medicare Observation Status Notification Given:  Yes    Erenest Rasher, RN 10/05/2016, 4:30 PM

## 2016-10-05 NOTE — Progress Notes (Signed)
Nutrition Brief Note  Patient identified on the Malnutrition Screening Tool (MST) Report  Wt Readings from Last 15 Encounters:  10/05/16 161 lb 11.2 oz (73.3 kg)  09/30/16 162 lb (73.5 kg)  09/18/16 145 lb (65.8 kg)  09/17/16 150 lb (68 kg)  09/12/16 155 lb (70.3 kg)  08/27/16 155 lb (70.3 kg)  06/20/16 159 lb (72.1 kg)  06/03/16 159 lb (72.1 kg)  05/10/16 159 lb (72.1 kg)  04/20/16 158 lb 11.2 oz (72 kg)  04/06/16 155 lb (70.3 kg)  04/05/16 156 lb 1.6 oz (70.8 kg)  03/30/16 156 lb 9.6 oz (71 kg)  03/27/16 145 lb (65.8 kg)  03/27/16 145 lb (65.8 kg)   [name] is a 76 y.o. female with medical history significant of CAD, HTN, DM and lap band surgery at [facility] > 10 years ago.  She was recently hospitalized at [facility] with intractable N/V.  She was placed on IV reglan and states that she was able to eat as long as she was getting that medication.  She was to have a diabetic gastroparesis study done but was not made NPO so this was to be arranged as an outpatient.   Body mass index is 29.58 kg/m. Patient meets criteria for overweight based on current BMI.   Current diet order is NPO, patient is consuming approximately n/a% of meals at this time. Labs and medications reviewed.   No nutrition interventions warranted at this time. If nutrition issues arise, please consult RD.   [name], RD, LDN, CDE Pager: [phone] After hours Pager: [phone]

## 2016-10-05 NOTE — Care Management Note (Signed)
Case Management Note  Patient Details  Name: [name] MRN: [id number] Date of Birth: February 27, 1940  Subjective/Objective:     Nausea, vomiting               Action/Plan: Discharge Planning:  NCM spoke to pt and lives at home alone. States she was independent pta. Still drives to her appts. Will continue to follow for dc needs.   PCP [place], Lynnell Jude MD   Expected Discharge Date:                  Expected Discharge Plan:  Home/Self Care  In-House Referral:  NA  Discharge planning Services  NA  Post Acute Care Choice:  NA Choice offered to:  NA  DME Arranged:  N/A DME Agency:  NA  HH Arranged:  NA HH Agency:  NA  Status of Service:  Completed, signed off  If discussed at [place] of Stay Meetings, dates discussed:    Additional Comments:  Erenest Rasher, RN 10/05/2016, 4:27 PM

## 2016-10-05 NOTE — Consult Note (Signed)
[facility] Consult: 11:21 AM 10/05/2016  LOS: 0 days    Referring Provider: Dr Eliseo Squires  Primary Care Physician:  Lynnell Jude, MD Primary Gastroenterologist:  Dr. Ardis Hughs  Oncologist.  At [facility], Levonne Lapping MD  Reason for Consultation:  Chronic nausea and vomiting.     HPI: [name] is a 76 y.o. female.  PMH CAD, stenting 2007, CABG 10/2015.  Cardiac cath 01/2016 severe 3 vessel dz, in stent restenosis in LAD, no targets for PCI, 2/2 grafts patent, RCA chronically occluded but fills from collaterals: continue medical management of CAD.  ASPVD.  S/p multiple bil LE angioplasties/stents before and after 2013 left fem-pop BPG.  S/p renal artery stent  CHF, ischemic CM.  MI.  CVA 2007.  DM 2.  Multiple myeloma diagnosed 2015, treated with radiation and chemo.  Pt stopped chemo due to side effects/intolerance after a year or so.  She follows with DR Long at [facility] and has been told she is in remission.  Was due for her q 3 month appt with Dr Laverta Baltimore tomorrow. 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  Hypercholesterolemia.  Type 2 DM, on oral agents.  Kidney stones.   Hx anemia a few to several years ago, requiring blood transfusion but not taking iron, B12 or Folate.   S/p lap band bariatric surgery 2007 resulting in 100# permanent weight loss.  Right shoulder tendon tear.  Previous (unremarkable per her recall) colonoscopy 5 or 6 years ago.    03/2016 EGD for abdominal pain, N/V, weight loss.  Dr Ardis Hughs.  Mild inflammation characterized by erythema and friability in gastric antrum, biopsied (benign gastric mucosa, no H Pylori). Small amount of hematin in the stomach at the outset of the procedure.  Normal appearing deformity from  previous lap band in proximal stomach. Along this deformity was a slightly irregular mucosa (erosion?) that was oozing blood actively. This bleeding started during the examination without evident trauma to the area. Endoscopic clip placement was performed with cessation of bleeding.  20 mm non-bleeding diverticulum in the area of the papilla. 04/12/2016 Esophagram:  Mild tertiary contractions mid and distal esophagus.  No hiatal hernia.  No gastroesophageal reflux.  04/29/16 referral and OV with [facility], Dr Greer Pickerel.  Felt there was no indication for lap "band vacation".  He was concerned intermittent RUQ pain with fatty food trigger could be biliary so ordered HIDA.   05/10/2017 HIDA scan: normal study, GB EF 98% at 2 hours.  Patent CBD.     ~ 6 weeks of non-bloody N/V, dry heaves.  Early on had non-severe left abdominal pain but this not an issue for several weeks.  Denies post prandial abdominal pain.  In ED on 2/2, discharged home after IV antiemetics, IV pepcid and IVF.  Sent home on Pepcid. Cardiac enzymes negative.     9/8 - 9/11 admission to [facility] for this.  GI Dr Marius Ditch, suggested GES but it was cancelled after pt presented to [place] and was  not NPO.   Although Metoclopramide listed on previous med lists, she denies ever using this except during the [facility] admission when it was given IV and was helpful. Discharged on prn Zofran, prn Phenergan, Pepcid 20 mg BID.  Reglan discontinued.  Plan was to fup with [facility].  Outpt GES was not arranged. At follow up with cardiologist 9/20, Dr Meda Coffee stopped Plavix, continued 81 ASA.  Also advised her to hold Lyrica for a week to see if it helped c/o night sweats.  Other recent med adjustments were stopping daily abx in use to suppress recurrent UTIs.   Dr Meda Coffee arranged upper extremity duplex studies 09/30/16 for eval of left antecubital bruit suspicious for AV fistula.     N/V persists, only thing she tolerates is Ginger ale.  Phenergan provides relief for 2  to 3 hours at most and lately this in not effective.  Nauseated and dry heaves >> vomiting regardless of PO status.  Never sees CG or blood in emesis. Infrequent stools with minimal po intake.  Latest stool a few days ago was brown, soft/loose.  No NSAIDs, no po narcotics: only stable chronic Fentanyl patch. No ETOH. Came to ED with sxs yesterday.    Labs at [facility] with AKI.  LFTs and Lipase normal at [facility] and currently, as on many occasions throughout 2018.  Cardiac enzymes on 9/2 and 9/7 were both negative.   Labs now with lesser AKI.   U/A with many bacteria, 6-30 WBCs, trace leukocytes, no nitrites.  Urine clx processing.   09/12/16 CT abdomen/pelvis.  Gastric lap band in situ.  Duodenal diverticulum. Hepatic cysts, stable.  Incomplete view of GB but no stone.  Pancreas ok.    Sigmoid diverticulosis.  Coronary and aortic atherosclerosis, left SFA stent occluded.  09/17/16 abd ultrasound: No gallstones or gallbladder inflammation. No biliary dilatation.  Heterogeneous hepatic parenchyma without discrete lesion. 10/04/16 AAS films.  NOBGP.      Past Medical History:  Diagnosis Date  . Anginal pain ([place])   . Anxiety   . Arthritis    "shoulders primarily" (01/13/2016)  . CAD (coronary artery disease)    s/p CABG in Oct 2017  . CHF (congestive heart failure) ([place])   . Chronic pain    from her multiple myeloma but says that her pain is usually in her legs/notes 01/13/2016  . High cholesterol   . History of blood transfusion    "numerous; related to procedures for my heart" (01/13/2016)  . Hypertension   . Ischemic cardiomyopathy    /nots 01/13/2016  . MI (myocardial infarction) ([place])    EKG on arrival 01/13/2016 showed NSR with evidence of old anterior and inferior infarcts  . MI (myocardial infarction) ([place]) 08/2015  . Multiple myeloma ([place]) dx'd 2015   off chemo since 2016  . Nausea and vomiting 05/10/2016   3-4 month hx of nausea and vomiting  . PAD (peripheral artery disease) ([place])   . Stroke  [facility])    "several in 1 year; ? year" (01/13/2016)  . Type II diabetes mellitus ([place])     Past Surgical History:  Procedure Laterality Date  . CARDIAC CATHETERIZATION N/A 01/14/2016   Procedure: Left Heart Cath and Cors/Grafts Angiography;  Surgeon: Burnell Blanks, MD;  Location: [place] [facility];  Service: Cardiovascular;  Laterality: N/A;  . CARPAL TUNNEL RELEASE Left   . CORONARY ANGIOPLASTY WITH STENT PLACEMENT  05/2015   DES placed to the mid and distal LAD as well as OM2/notes  01/13/2016  . CORONARY ANGIOPLASTY WITH STENT PLACEMENT  ~ 2010  . CORONARY ARTERY BYPASS GRAFT  11/11/2015   CABG X2 LIMA-LAD and SVG-OM/notes 01/13/2016  . DILATION AND CURETTAGE OF UTERUS    . FEMORAL-POPLITEAL BYPASS GRAFT Left 2013   Archie Endo 01/13/2016  . LAPAROSCOPIC GASTRIC BANDING  2007  . RENAL ARTERY STENT    . VAGINAL HYSTERECTOMY      Prior to Admission medications   Medication Sig Start Date End Date Taking? Authorizing Provider  Alirocumab (PRALUENT) 75 MG/ML SOPN Inject 1 pen into the skin every 14 (fourteen) days. STUDY MEDICATION 05/06/16  Yes Dorothy Spark, MD  aspirin 81 MG tablet Take 81 mg by mouth daily.   Yes [provider]  Cholecalciferol (VITAMIN D3) 50000 units CAPS Take 1 capsule by mouth 2 (two) times a week. Sunday AND Wednesday   Yes [provider]  diclofenac sodium (VOLTAREN) 1 % GEL Apply 2 g topically daily as needed for pain.   Yes [provider]  fentaNYL (DURAGESIC - DOSED MCG/HR) 50 MCG/HR Place 50 mcg onto the skin every 3 (three) days.    Yes [provider]  fluticasone (FLONASE) 50 MCG/ACT nasal spray Place 2 sprays into both nostrils daily as needed for allergies.  03/08/16  Yes [provider]  furosemide (LASIX) 20 MG tablet Take 1 tablet (20 mg total) by mouth daily as needed for fluid or edema. 05/10/16 10/04/16 Yes Dorothy Spark, MD  gabapentin (NEURONTIN) 300 MG capsule Take 300-600 mg by mouth See admin  instructions. Pt takes 678m in am, 307mat bedtime   Yes [provider]  hydrocortisone cream 1 % Apply topically as needed for itching. 01/15/16  Yes SmJettie Booze, NP  HYDROmorphone (DILAUDID) 2 MG tablet Take 2 mg by mouth every 4 (four) hours as needed for severe pain.   Yes [provider]  isosorbide mononitrate (IMDUR) 60 MG 24 hr tablet Take 1 tablet (60 mg total) by mouth daily. 05/10/16 10/04/16 Yes NeDorothy SparkMD  lisinopril (PRINIVIL,ZESTRIL) 5 MG tablet Take 1 tablet (5 mg total) by mouth daily. 01/16/16  Yes SmArbutus LeasNP  metFORMIN (GLUCOPHAGE) 500 MG tablet Take 500 mg by mouth 2 (two) times daily with a meal.    Yes [provider]  nitroGLYCERIN (NITROSTAT) 0.4 MG SL tablet Place 1 tablet (0.4 mg total) under the tongue every 5 (five) minutes as needed for chest pain. 01/15/16  Yes SmArbutus LeasNP  Omega-3 Fatty Acids (FISH OIL) 1000 MG CAPS Take 2 capsules (2,000 mg total) by mouth daily. Patient taking differently: Take 1,000 mg by mouth daily.  05/06/16  Yes NeDorothy SparkMD  promethazine (PHENERGAN) 12.5 MG tablet Take 1-2 every 6 hours prn for nausea 04/06/16  Yes JaMilus BanisterMD  senna-docusate (SENOKOT-S) 8.6-50 MG tablet Take 2 tablets by mouth 2 (two) times daily as needed for constipation. 11/17/15 11/16/16 Yes [provider]  alfuzosin (UROXATRAL) 10 MG 24 hr tablet Take 1 tablet (10 mg total) by mouth daily with breakfast. 03/30/16   EsFestus AloeMD  famotidine (PEPCID) 20 MG tablet Take 1 tablet (20 mg total) by mouth 2 (two) times daily. 09/17/16 09/17/17  VeRudene ReMD  metoprolol tartrate (LOPRESSOR) 25 MG tablet Take 0.5 tablets (12.5 mg total) by mouth 2 (two) times daily. 02/23/16 09/17/16  NeDorothy SparkMD  ondansetron (ZOFRAN-ODT) 4 MG disintegrating tablet Take 1 tablet (4 mg total) by  mouth every 8 (eight) hours as needed for nausea or vomiting. 09/21/16   Henreitta Leber, MD  promethazine  (PHENERGAN) 12.5 MG tablet Take 1 tablet (12.5 mg total) by mouth every 6 (six) hours as needed for nausea or vomiting. 09/21/16   Henreitta Leber, MD    Scheduled Meds: . enoxaparin (LOVENOX) injection  40 mg Subcutaneous Q24H  . feeding supplement (ENSURE ENLIVE)  237 mL Oral BID BM  . fentaNYL  50 mcg Transdermal Q72H  . fluticasone  2 spray Each Nare Daily  . insulin aspart  0-9 Units Subcutaneous Q4H  . isosorbide mononitrate  60 mg Oral Daily  . pantoprazole (PROTONIX) IV  40 mg Intravenous Q24H   Infusions: . sodium chloride Stopped (10/05/16 1055)   PRN Meds: diclofenac sodium, promethazine   Allergies as of 10/04/2016 - Review Complete 10/04/2016  Allergen Reaction Noted  . Other Swelling 09/27/2014  . Statins Swelling 11/29/2015  . Amoxicillin  07/26/2015  . Codeine  07/26/2015  . Sulfa antibiotics  01/13/2016  . Duloxetine Nausea Only 07/07/2015    Family History  Problem Relation Age of Onset  . Diabetes Mother   . Heart failure Mother   . Prostate cancer Neg Hx   . Kidney cancer Neg Hx   . Bladder Cancer Neg Hx     Social History   Social History  . Marital status: Divorced    Spouse name: N/A  . Number of children: 2  . Years of education: N/A   Occupational History  . retired    Social History Main Topics  . Smoking status: Never Smoker  . Smokeless tobacco: Never Used  . Alcohol use Yes     Comment: occasional wine  . Drug use: No  . Sexual activity: Not on file   Other Topics Concern  . Not on file   Social History Narrative   Independent at baseline. Ambulates with the help of a walker    REVIEW OF SYSTEMS: Constitutional:  Weakness due to lack of po intake ENT:  No nose bleeds Pulm:  No SOB or cough.  No PND CV:  No palpitations, no LE edema.  GU:  No oliguria.  No hematuria, no frequency GI:  Per HPI Heme:  No excessive bleeding.  Bruises easily.     Transfusions:  Multiple PRBCs and blood products in past.  Latest 11/2015.     MS:  Use a walker and scotter for longer distances due to legs giving out on her.  Before 6 to 8 weeks ago was walking up to 600 feet without problem.   Neuro:  No headaches, no peripheral tingling or numbness Derm:  No itching, no rash or sores.  Endocrine:  No sweats or chills.  No polyuria or dysuria Immunization:  Not queried Travel:  None beyond local counties in last few months.    PHYSICAL EXAM: Vital signs in last 24 hours: Vitals:   10/04/16 2147 10/05/16 0548  BP: (!) 123/42 (!) 107/39  Pulse: 80 74  Resp: 20 20  Temp: 98.2 F (36.8 C) 98.2 F (36.8 C)  SpO2: 99% 99%   Wt Readings from Last 3 Encounters:  10/05/16 73.3 kg (161 lb 11.2 oz)  09/30/16 73.5 kg (162 lb)  09/18/16 65.8 kg (145 lb)    General: pleasant, does not look ill, certainly looks better in person than on paper.   Head:  No asymmetry, facial swelling or signs of trauma  Eyes:  No icterus or pallor.  EOMI Ears:  Not HOH  Nose:  No discharge or congestion Mouth:  Moist, clear mucosa.  Good dental repair.   Neck:  No mass, no JVD Lungs:  Clear bil.  No SOB or cough Heart: RRR.  No MRG.  S1, S2 present.   Abdomen:  Soft, NT, ND, mild-moderately obese.  No mass, bruits, HSM, hernia.   Rectal: deferred   Musc/Skeltl: no joint swellling, redness or contractures Extremities:  No CCE  Neurologic:  Oriented x 3.  Good historian.  Moves all 4 limbs, strength not tested.  No tremor.   Skin:  No telangectasia, large hematoma, significant purpura or rashes.  Tattoos:  none Nodes:  No cervical adenopathy   Psych:  Cooperative, calm.    Intake/Output from previous day: 09/24 0701 - 09/25 0700 In: 483.8 [P.O.:60; I.V.:423.8] Out: -  Intake/Output this shift: No intake/output data recorded.  LAB RESULTS:  Recent Labs  10/04/16 0955 10/05/16 0757  WBC 8.7 8.5  HGB 12.3 10.5*  HCT 39.0 33.8*  PLT 209 168   BMET Lab Results  Component Value Date   NA 137 10/05/2016   NA 137 10/04/2016   NA  139 09/20/2016   K 3.7 10/05/2016   K 4.2 10/04/2016   K 3.7 09/20/2016   CL 105 10/05/2016   CL 101 10/04/2016   CL 109 09/20/2016   CO2 23 10/05/2016   CO2 26 10/04/2016   CO2 25 09/20/2016   GLUCOSE 107 (H) 10/05/2016   GLUCOSE 190 (H) 10/04/2016   GLUCOSE 147 (H) 09/20/2016   BUN 11 10/05/2016   BUN 11 10/04/2016   BUN 16 09/20/2016   CREATININE 1.11 (H) 10/05/2016   CREATININE 1.14 (H) 10/04/2016   CREATININE 1.31 (H) 09/20/2016   CALCIUM 8.3 (L) 10/05/2016   CALCIUM 9.4 10/04/2016   CALCIUM 8.4 (L) 09/20/2016   LFT  Recent Labs  10/04/16 0955 10/05/16 0757  PROT 6.8 5.3*  ALBUMIN 4.0 3.1*  AST 21 14*  ALT 14 11*  ALKPHOS 44 36*  BILITOT 0.8 1.0   PT/INR Lab Results  Component Value Date   INR 1.06 01/14/2016   INR 0.97 07/26/2015   Hepatitis Panel No results for input(s): HEPBSAG, HCVAB, HEPAIGM, HEPBIGM in the last 72 hours. C-Diff No components found for: CDIFF Lipase     Component Value Date/Time   LIPASE 24 10/04/2016 0955    Drugs of Abuse  No results found for: LABOPIA, COCAINSCRNUR, LABBENZ, AMPHETMU, THCU, LABBARB   RADIOLOGY STUDIES: Dg Abdomen Acute W/chest  Result Date: 10/04/2016 CLINICAL DATA:  Nausea for 1 month EXAM: DG ABDOMEN ACUTE W/ 1V CHEST COMPARISON:  09/12/2016, CT 09/12/2016 FINDINGS: Single-view chest demonstrates post sternotomy changes. Normal cardiomediastinal silhouette with atherosclerosis. No pneumothorax. Supine and upright views of the abdomen demonstrate gastric band. No free air beneath the diaphragm. Nonobstructed bowel-gas pattern. Mild scoliosis of the spine. Calcified phleboliths in the pelvis. Surgical clips in the left groin and partially visualized stent in the right groin. IMPRESSION: 1. No radiographic evidence for acute cardiopulmonary abnormality. 2. Nonobstructed bowel-gas pattern Electronically Signed   By: Donavan Foil M.D.   On: 10/04/2016 16:24      IMPRESSION:   *  Chronic N/V without abdominal  pain.  R/o gastroparesis.  ? Complication from lap band which is patent and in good position per studies in spring 2018 and CT 09/2016.  Dr Redmond Pulling did not suspect lap band problems in 04/2016 but at time he was seeing her for c/o  right sided abd pain, not N/V.   R/o atypical intestinal ischemia as she has significant CAD and ASPVD with previous interventions, surgeries.   EGD 03/2016: gastritis and oozing blood in area of lap band, treated with endoclip, duodenal diverticulum.     *  2007 Lap band.   ? Complication from lap band which is patent and in good position per studies in spring 2018.  Dr Redmond Pulling did not suspect lap band problems in 04/2016 and CT 09/2016 but at time he was seeing her for c/o right sided abd pain, not N/V.  *  CAD, ASPVD.  On 81 ASA.  Plavix stopped 5 days ago.    *  Anemia.  Normocytic.  Suggest post hydration effect.  Multiple transfusions in 2017, mostly associated with CABG and PVD procedures.    *  Hx MM in remission.    *  AKI.  ? Element of CKD.  S/p renal artery stent per history.    *  DM 2, on oral agents at home.  Sugars not out of control.      PLAN:     *  Gastric emptying study, spoke with NM and should get done today.   *  Begin scheduled Reglan after the GES study completed.    *  AM cortisol level.  TSH in AM (though normal 05/2016).     Azucena Freed  10/05/2016, 11:21 AM Pager: [phone]

## 2016-10-05 NOTE — Progress Notes (Signed)
PROGRESS NOTE    [name]  LGX:[id number] DOB: 01-20-1940 DOA: 10/04/2016 PCP: [name] Jude, MD   Outpatient Specialists:     Brief Narrative:  [name] is a 76 y.o. female with medical history significant of CAD, HTN, DM and lap band surgery at [facility] > 10 years ago.  She was recently hospitalized at [facility] with intractable N/V.  She was placed on IV reglan and states that she was able to eat as long as she was getting that medication.  She was to have a diabetic gastroparesis study done but was not made NPO so this was to be arranged as an outpatient.  She returns here with persistent nausea and dry heaving.  She has not followed up with her lap band surgeon at [facility] in a number of years. [name] has lost 11lbs in the last 11 days.  She had an EGD done in March of 2018 with Dr. Ardis Hughs: erosion at the site of her lap band and gastritis.  She states she had also had a colonoscopy before showing diverticulosis.  In the ER, she had continued nausea and inability to eat so she will be placed in observation.  Labs unremarkable: normal lipase, elevated blood sugar.   Assessment & Plan:   Active Problems:   Nausea & vomiting   Uncontrolled N/V -suspect diabetic gastroparesis -better at [facility] with reglan (will hold in case of Gastric emptying study) -will defer nuclear study vs EGD to GI -GI to see today -IVF overnight -NPO -PRN phenergan  Chronic pain -on fentayl patch -denies constipation  S/p gastric banding -? Need to follow up with surgeon for band changes/removal     DVT prophylaxis:  Lovenox   Code Status: Full Code   Family Communication:   Disposition Plan:  Per GI   Consultants:   GI  Procedures:    Subjective: Slept well last night  Objective: Vitals:   10/04/16 1945 10/04/16 2147 10/05/16 0300 10/05/16 0548  BP: (!) 150/57 (!) 123/42  (!) 107/39  Pulse: 80 80  74  Resp: 10 20  20   Temp:  98.2 F (36.8 C)  98.2 F (36.8  C)  TempSrc:  Oral  Oral  SpO2: 98% 99%  99%  Weight:   73.3 kg (161 lb 11.2 oz)   Height:   5\' 2"  (1.575 m)     Intake/Output Summary (Last 24 hours) at 10/05/16 8144 Last data filed at 10/05/16 0600  Gross per 24 hour  Intake           483.75 ml  Output                0 ml  Net           483.75 ml   Filed Weights   10/05/16 0300  Weight: 73.3 kg (161 lb 11.2 oz)    Examination:  General exam: Appears calm and comfortable  Respiratory system: no wheezing, diminished Cardiovascular system: rrr Gastrointestinal system: +sluggish bowel sounds Central nervous system: Alert and oriented. No focal neurological deficits. Extremities: Symmetric 5 x 5 power. Psychiatry: Judgement and insight appear normal. Mood & affect appropriate.     Data Reviewed: I have personally reviewed following labs and imaging studies  CBC:  Recent Labs Lab 10/04/16 0955  WBC 8.7  HGB 12.3  HCT 39.0  MCV 84.8  PLT 818   Basic Metabolic Panel:  Recent Labs Lab 10/04/16 0955  NA 137  K 4.2  CL  101  CO2 26  GLUCOSE 190*  BUN 11  CREATININE 1.14*  CALCIUM 9.4   GFR: Estimated Creatinine Clearance: 40 mL/min (A) (by C-G formula based on SCr of 1.14 mg/dL (H)). Liver Function Tests:  Recent Labs Lab 10/04/16 0955  AST 21  ALT 14  ALKPHOS 44  BILITOT 0.8  PROT 6.8  ALBUMIN 4.0    Recent Labs Lab 10/04/16 0955  LIPASE 24   No results for input(s): AMMONIA in the last 168 hours. Coagulation Profile: No results for input(s): INR, PROTIME in the last 168 hours. Cardiac Enzymes: No results for input(s): CKTOTAL, CKMB, CKMBINDEX, TROPONINI in the last 168 hours. BNP (last 3 results) No results for input(s): PROBNP in the last 8760 hours. HbA1C: No results for input(s): HGBA1C in the last 72 hours. CBG:  Recent Labs Lab 10/04/16 2052 10/05/16 0005 10/05/16 0400 10/05/16 0747  GLUCAP 116* 112* 96 103*   Lipid Profile: No results for input(s): CHOL, HDL, LDLCALC,  TRIG, CHOLHDL, LDLDIRECT in the last 72 hours. Thyroid Function Tests: No results for input(s): TSH, T4TOTAL, FREET4, T3FREE, THYROIDAB in the last 72 hours. Anemia Panel: No results for input(s): VITAMINB12, FOLATE, FERRITIN, TIBC, IRON, RETICCTPCT in the last 72 hours. Urine analysis:    Component Value Date/Time   COLORURINE YELLOW 10/04/2016 1420   APPEARANCEUR HAZY (A) 10/04/2016 1420   APPEARANCEUR Clear 04/05/2016 0857   LABSPEC 1.015 10/04/2016 1420   PHURINE 5.0 10/04/2016 1420   GLUCOSEU NEGATIVE 10/04/2016 1420   HGBUR NEGATIVE 10/04/2016 1420   BILIRUBINUR NEGATIVE 10/04/2016 1420   BILIRUBINUR Negative 04/05/2016 0857   KETONESUR 20 (A) 10/04/2016 1420   PROTEINUR NEGATIVE 10/04/2016 1420   NITRITE NEGATIVE 10/04/2016 1420   LEUKOCYTESUR TRACE (A) 10/04/2016 1420   LEUKOCYTESUR Negative 04/05/2016 0857     )No results found for this or any previous visit (from the past 240 hour(s)).    Anti-infectives    None       Radiology Studies: Dg Abdomen Acute W/chest  Result Date: 10/04/2016 CLINICAL DATA:  Nausea for 1 month EXAM: DG ABDOMEN ACUTE W/ 1V CHEST COMPARISON:  09/12/2016, CT 09/12/2016 FINDINGS: Single-view chest demonstrates post sternotomy changes. Normal cardiomediastinal silhouette with atherosclerosis. No pneumothorax. Supine and upright views of the abdomen demonstrate gastric band. No free air beneath the diaphragm. Nonobstructed bowel-gas pattern. Mild scoliosis of the spine. Calcified phleboliths in the pelvis. Surgical clips in the left groin and partially visualized stent in the right groin. IMPRESSION: 1. No radiographic evidence for acute cardiopulmonary abnormality. 2. Nonobstructed bowel-gas pattern Electronically Signed   By: Donavan Foil M.D.   On: 10/04/2016 16:24        Scheduled Meds: . enoxaparin (LOVENOX) injection  40 mg Subcutaneous Q24H  . feeding supplement (ENSURE ENLIVE)  237 mL Oral BID BM  . fentaNYL  50 mcg Transdermal  Q72H  . fluticasone  2 spray Each Nare Daily  . insulin aspart  0-9 Units Subcutaneous Q4H  . isosorbide mononitrate  60 mg Oral Daily  . pantoprazole (PROTONIX) IV  40 mg Intravenous Q24H   Continuous Infusions: . sodium chloride 50 mL/hr at 10/05/16 0831     LOS: 0 days    Time spent: 25 min    [place], DO [facility] Pager [phone]  If 7PM-7AM, please contact night-coverage [email or web address] Password [facility] 10/05/2016, 8:42 AM

## 2016-10-06 ENCOUNTER — Inpatient Hospital Stay (HOSPITAL_COMMUNITY): Payer: Medicare Other

## 2016-10-06 DIAGNOSIS — E119 Type 2 diabetes mellitus without complications: Secondary | ICD-10-CM

## 2016-10-06 LAB — VITAMIN B12: VITAMIN B 12: 441 pg/mL (ref 180–914)

## 2016-10-06 LAB — GLUCOSE, CAPILLARY
Glucose-Capillary: 112 mg/dL — ABNORMAL HIGH (ref 65–99)
Glucose-Capillary: 127 mg/dL — ABNORMAL HIGH (ref 65–99)
Glucose-Capillary: 134 mg/dL — ABNORMAL HIGH (ref 65–99)
Glucose-Capillary: 184 mg/dL — ABNORMAL HIGH (ref 65–99)
Glucose-Capillary: 96 mg/dL (ref 65–99)

## 2016-10-06 LAB — IRON AND TIBC
IRON: 31 ug/dL (ref 28–170)
Saturation Ratios: 10 % — ABNORMAL LOW (ref 10.4–31.8)
TIBC: 300 ug/dL (ref 250–450)
UIBC: 269 ug/dL

## 2016-10-06 LAB — TSH: TSH: 3.675 u[IU]/mL (ref 0.350–4.500)

## 2016-10-06 LAB — CORTISOL-AM, BLOOD: CORTISOL - AM: 11.8 ug/dL (ref 6.7–22.6)

## 2016-10-06 MED ORDER — PANTOPRAZOLE SODIUM 40 MG PO TBEC: 40.0000 mg | DELAYED_RELEASE_TABLET | Freq: Every day | ORAL | 0 refills | Status: AC

## 2016-10-06 MED ORDER — PROMETHAZINE HCL 12.5 MG PO TABS: 12.5000 mg | ORAL_TABLET | Freq: Four times a day (QID) | ORAL | 0 refills | Status: DC

## 2016-10-06 MED ORDER — PANTOPRAZOLE SODIUM 40 MG PO TBEC
40.0000 mg | DELAYED_RELEASE_TABLET | Freq: Every day | ORAL | Status: DC
  Administered 2016-10-06: 40 mg via ORAL
  Filled 2016-10-06: qty 1

## 2016-10-06 MED ORDER — ENSURE ENLIVE PO LIQD: 237.0000 mL | Freq: Two times a day (BID) | ORAL | 12 refills | Status: DC

## 2016-10-06 NOTE — Progress Notes (Signed)
Daily Rounding Note  10/06/2016, 8:49 AM  LOS: 1 day   SUBJECTIVE:   Chief complaint: nausea.  Slightly better.  Tolerating clears in limited quantity.  Metoclopramide caused tremors and insomnia, she prefers not to take it.    Normal gastric emptying study. Pt wondering if it is worth repeating EGD?  OBJECTIVE:         Vital signs in last 24 hours:    Temp:  [98 F (36.7 C)-98.8 F (37.1 C)] 98 F (36.7 C) (09/26 0532) Pulse Rate:  [64-68] 64 (09/26 0749) Resp:  [18] 18 (09/25 2000) BP: (126-162)/(42-46) 126/42 (09/26 0749) SpO2:  [98 %-100 %] 100 % (09/26 0532) Last BM Date:  (PTA) Filed Weights   10/05/16 0300  Weight: 73.3 kg (161 lb 11.2 oz)   General: pleasant, looks well.  comfortable   Heart: RRR Chest: clear bil.  No cough or dyspnea Abdomen: soft, NT, ND.  Active BS  Extremities: no CCE Neuro/Psych:  Oriented x 3.  No weakness or tremor.    Intake/Output from previous day: No intake/output data recorded.  Intake/Output this shift: No intake/output data recorded.  Lab Results:  Recent Labs  10/04/16 0955 10/05/16 0757  WBC 8.7 8.5  HGB 12.3 10.5*  HCT 39.0 33.8*  PLT 209 168   BMET  Recent Labs  10/04/16 0955 10/05/16 0757  NA 137 137  K 4.2 3.7  CL 101 105  CO2 26 23  GLUCOSE 190* 107*  BUN 11 11  CREATININE 1.14* 1.11*  CALCIUM 9.4 8.3*   LFT  Recent Labs  10/04/16 0955 10/05/16 0757  PROT 6.8 5.3*  ALBUMIN 4.0 3.1*  AST 21 14*  ALT 14 11*  ALKPHOS 44 36*  BILITOT 0.8 1.0   PT/INR No results for input(s): LABPROT, INR in the last 72 hours. Hepatitis Panel No results for input(s): HEPBSAG, HCVAB, HEPAIGM, HEPBIGM in the last 72 hours.  Studies/Results: Nm Gastric Emptying  Result Date: 10/05/2016 CLINICAL DATA:  Nausea, vomiting, post laparoscopic band surgery in 2007, onset of symptoms in March 2018, history of hypertension, type II diabetes mellitus EXAM:  NUCLEAR MEDICINE GASTRIC EMPTYING SCAN TECHNIQUE: After oral ingestion of radiolabeled meal, sequential abdominal images were obtained for 2 hours. Percentage of activity emptying the stomach was calculated at 1 hour and at 2 hours. RADIOPHARMACEUTICALS:  2.0 mCi Tc-68m sulfur colloid in standardized meal COMPARISON:  None. FINDINGS: Expected location of the stomach in the left upper quadrant. Ingested meal empties the stomach gradually over the course of the study. 41% emptying at 1 hour. 96% emptying at 2 hours. Findings represent normal gastric emptying. IMPRESSION: Normal gastric emptying study. Electronically Signed   By: Lavonia Dana M.D.   On: 10/05/2016 17:53   Dg Abdomen Acute W/chest  Result Date: 10/04/2016 CLINICAL DATA:  Nausea for 1 month EXAM: DG ABDOMEN ACUTE W/ 1V CHEST COMPARISON:  09/12/2016, CT 09/12/2016 FINDINGS: Single-view chest demonstrates post sternotomy changes. Normal cardiomediastinal silhouette with atherosclerosis. No pneumothorax. Supine and upright views of the abdomen demonstrate gastric band. No free air beneath the diaphragm. Nonobstructed bowel-gas pattern. Mild scoliosis of the spine. Calcified phleboliths in the pelvis. Surgical clips in the left groin and partially visualized stent in the right groin. IMPRESSION: 1. No radiographic evidence for acute cardiopulmonary abnormality. 2. Nonobstructed bowel-gas pattern Electronically Signed   By: Donavan Foil M.D.   On: 10/04/2016 16:24    Scheduled Meds: . enoxaparin (LOVENOX) injection  40 mg Subcutaneous Q24H  . feeding supplement (ENSURE ENLIVE)  237 mL Oral BID BM  . fentaNYL  50 mcg Transdermal Q72H  . fluticasone  2 spray Each Nare Daily  . insulin aspart  0-9 Units Subcutaneous Q4H  . isosorbide mononitrate  60 mg Oral Daily  . metoCLOPramide (REGLAN) injection  5 mg Intravenous Q6H  . pantoprazole (PROTONIX) IV  40 mg Intravenous Q24H  . promethazine  12.5 mg Intravenous Q6H   Continuous Infusions: .  sodium chloride 50 mL/hr at 10/05/16 2121   PRN Meds:.diclofenac sodium   ASSESMENT:   *  Chronic N/V.  No gastroparesis, even in setting of chronic Fentanyl patch, on nuc med GES 9/25.      PLAN   *  Brain imaging to r/o mass lesion as source of N/V? Will d/w MDs.    *  Continue trial of low dose scheduled IV Phenergan.  Stop Metoclopramide and list as in tollerant under "allergies"   *  Trial full liquids.  Switch to po Protonix.      Azucena Freed  10/06/2016, 8:49 AM Pager: [phone]

## 2016-10-06 NOTE — Discharge Summary (Signed)
Physician Discharge Summary  [name] UUV:[id number] DOB: 1940-04-27 DOA: 10/04/2016  PCP: Lynnell Jude, MD  Admit date: 10/04/2016 Discharge date: 10/06/2016   Recommendations for Outpatient Follow-Up:   1. Zinc, B12, folate, copper, Vit D, iron labs pending 2. Scheduled phenergan as improved with this in hospital 3. Changed PPI to protonix 4. Patient to keep GI apointment    Discharge Diagnosis:   Active Problems:   H/O gastric bypass   Hypertension   Nausea & vomiting   Nausea and vomiting   Discharge disposition:  Home.  Discharge Condition: Improved.  Diet recommendation: Low sodium, heart healthy  Wound care: None.   History of Present Illness:   [name] is a 76 y.o. female with medical history significant of CAD, HTN, DM and lap band surgery at [facility] > 10 years ago.  She was recently hospitalized at [facility] with intractable N/V.  She was placed on IV reglan and states that she was able to eat as long as she was getting that medication.  She was to have a diabetic gastroparesis study done but was not made NPO so this was to be arranged as an outpatient.  She returns here with persistent nausea and dry heaving.  She has not followed up with her lap band surgeon at [facility] in a number of years. [name] has lost 11lbs in the last 11 days.  She had an EGD done in March of 2018 with Dr. Ardis Hughs: erosion at the site of her lap band and gastritis.  She states she had also had a colonoscopy before showing diverticulosis.  In the ER, she had continued nausea and inability to eat so she will be placed in observation.  Labs unremarkable: normal lipase, elevated blood sugar.  Denies fevers, has chills on and off, denies urinary pain.   Hospital Course by Problem:   Uncontrolled N/V -nuclear medicine negative for gastroparesis -reglan made patient "jittery" -GI consult appreciated -advance diet as tolerated -scheduled phenergan -CT scan of brain does not  show mass  Chronic pain -on fentayl patch- need weaned off -denies constipation  S/p gastric banding -defer to surgeon    Medical Consultants:    GI   Discharge Exam:   Vitals:   10/06/16 1300 10/06/16 1408  BP: (!) 126/53 (!) 142/51  Pulse: 76 81  Resp:  20  Temp: 98.5 F (36.9 C) 97.7 F (36.5 C)  SpO2: 97% 99%   Vitals:   10/06/16 0835 10/06/16 0900 10/06/16 1300 10/06/16 1408  BP: (!) 131/54 (!) 139/45 (!) 126/53 (!) 142/51  Pulse: 64  76 81  Resp:    20  Temp: 98.4 F (36.9 C)  98.5 F (36.9 C) 97.7 F (36.5 C)  TempSrc: Oral  Oral Oral  SpO2: 99%  97% 99%  Weight:      Height:        Gen:  NAD   The results of significant diagnostics from this hospitalization (including imaging, microbiology, ancillary and laboratory) are listed below for reference.     Procedures and Diagnostic Studies:   Nm Gastric Emptying  Result Date: 10/05/2016 CLINICAL DATA:  Nausea, vomiting, post laparoscopic band surgery in 2007, onset of symptoms in March 2018, history of hypertension, type II diabetes mellitus EXAM: NUCLEAR MEDICINE GASTRIC EMPTYING SCAN TECHNIQUE: After oral ingestion of radiolabeled meal, sequential abdominal images were obtained for 2 hours. Percentage of activity emptying the stomach was calculated at 1 hour and at 2 hours. RADIOPHARMACEUTICALS:  2.0 mCi Tc-35m sulfur colloid in standardized meal COMPARISON:  None. FINDINGS: Expected location of the stomach in the left upper quadrant. Ingested meal empties the stomach gradually over the course of the study. 41% emptying at 1 hour. 96% emptying at 2 hours. Findings represent normal gastric emptying. IMPRESSION: Normal gastric emptying study. Electronically Signed   By: [name] M.D.   On: 10/05/2016 17:53   Dg Abdomen Acute W/chest  Result Date: 10/04/2016 CLINICAL DATA:  Nausea for 1 month EXAM: DG ABDOMEN ACUTE W/ 1V CHEST COMPARISON:  09/12/2016, CT 09/12/2016 FINDINGS: Single-view chest  demonstrates post sternotomy changes. Normal cardiomediastinal silhouette with atherosclerosis. No pneumothorax. Supine and upright views of the abdomen demonstrate gastric band. No free air beneath the diaphragm. Nonobstructed bowel-gas pattern. Mild scoliosis of the spine. Calcified phleboliths in the pelvis. Surgical clips in the left groin and partially visualized stent in the right groin. IMPRESSION: 1. No radiographic evidence for acute cardiopulmonary abnormality. 2. Nonobstructed bowel-gas pattern Electronically Signed   By: Donavan Foil M.D.   On: 10/04/2016 16:24     Labs:   Basic Metabolic Panel:  Recent Labs Lab 10/04/16 0955 10/05/16 0757  NA 137 137  K 4.2 3.7  CL 101 105  CO2 26 23  GLUCOSE 190* 107*  BUN 11 11  CREATININE 1.14* 1.11*  CALCIUM 9.4 8.3*   GFR Estimated Creatinine Clearance: 41.1 mL/min (A) (by C-G formula based on SCr of 1.11 mg/dL (H)). Liver Function Tests:  Recent Labs Lab 10/04/16 0955 10/05/16 0757  AST 21 14*  ALT 14 11*  ALKPHOS 44 36*  BILITOT 0.8 1.0  PROT 6.8 5.3*  ALBUMIN 4.0 3.1*    Recent Labs Lab 10/04/16 0955  LIPASE 24   No results for input(s): AMMONIA in the last 168 hours. Coagulation profile No results for input(s): INR, PROTIME in the last 168 hours.  CBC:  Recent Labs Lab 10/04/16 0955 10/05/16 0757  WBC 8.7 8.5  HGB 12.3 10.5*  HCT 39.0 33.8*  MCV 84.8 85.4  PLT 209 168   Cardiac Enzymes: No results for input(s): CKTOTAL, CKMB, CKMBINDEX, TROPONINI in the last 168 hours. BNP: Invalid input(s): POCBNP CBG:  Recent Labs Lab 10/05/16 2022 10/06/16 0009 10/06/16 0528 10/06/16 0811 10/06/16 1157  GLUCAP 156* 127* 96 112* 184*   D-Dimer No results for input(s): DDIMER in the last 72 hours. Hgb A1c No results for input(s): HGBA1C in the last 72 hours. Lipid Profile No results for input(s): CHOL, HDL, LDLCALC, TRIG, CHOLHDL, LDLDIRECT in the last 72 hours. Thyroid function studies  Recent  Labs  10/06/16 0534  TSH 3.675   Anemia work up No results for input(s): VITAMINB12, FOLATE, FERRITIN, TIBC, IRON, RETICCTPCT in the last 72 hours. Microbiology Recent Results (from the past 240 hour(s))  Urine culture     Status: Abnormal (Preliminary result)   Collection Time: 10/04/16  2:21 PM  Result Value Ref Range Status   Specimen Description URINE, RANDOM  Final   Special Requests NONE  Final   Culture >=100,000 COLONIES/mL ESCHERICHIA COLI (A)  Final   Report Status PENDING  Incomplete     Discharge Instructions:   Discharge Instructions    Diet - low sodium heart healthy    Complete by:  As directed    Diet Carb Modified    Complete by:  As directed    Increase activity slowly    Complete by:  As directed      Allergies as of 10/06/2016  Reactions   Other Swelling   Smoke--Head swelling   Statins Swelling   Metoclopramide Other (See Comments)   Causes tremors and insomnia   Amoxicillin    Codeine    Sulfa Antibiotics    Duloxetine Nausea Only      Medication List    STOP taking these medications   famotidine 20 MG tablet Commonly known as:  PEPCID   HYDROmorphone 2 MG tablet Commonly known as:  DILAUDID     TAKE these medications   alfuzosin 10 MG 24 hr tablet Commonly known as:  UROXATRAL Take 1 tablet (10 mg total) by mouth daily with breakfast.   aspirin 81 MG tablet Take 81 mg by mouth daily.   diclofenac sodium 1 % Gel Commonly known as:  VOLTAREN Apply 2 g topically daily as needed for pain.   feeding supplement (ENSURE ENLIVE) Liqd Take 237 mLs by mouth 2 (two) times daily between meals.   fentaNYL 50 MCG/HR Commonly known as:  DURAGESIC - dosed mcg/hr Place 50 mcg onto the skin every 3 (three) days.   Fish Oil 1000 MG Caps Take 2 capsules (2,000 mg total) by mouth daily. What changed:  how much to take   fluticasone 50 MCG/ACT nasal spray Commonly known as:  FLONASE Place 2 sprays into both nostrils daily as needed for  allergies.   furosemide 20 MG tablet Commonly known as:  LASIX Take 1 tablet (20 mg total) by mouth daily as needed for fluid or edema.   gabapentin 300 MG capsule Commonly known as:  NEURONTIN Take 300-600 mg by mouth See admin instructions. Pt takes 600mg  in am, 300mg  at bedtime   hydrocortisone cream 1 % Apply topically as needed for itching.   isosorbide mononitrate 60 MG 24 hr tablet Commonly known as:  IMDUR Take 1 tablet (60 mg total) by mouth daily.   lisinopril 5 MG tablet Commonly known as:  PRINIVIL,ZESTRIL Take 1 tablet (5 mg total) by mouth daily.   metFORMIN 500 MG tablet Commonly known as:  GLUCOPHAGE Take 500 mg by mouth 2 (two) times daily with a meal.   metoprolol tartrate 25 MG tablet Commonly known as:  LOPRESSOR Take 0.5 tablets (12.5 mg total) by mouth 2 (two) times daily.   nitroGLYCERIN 0.4 MG SL tablet Commonly known as:  NITROSTAT Place 1 tablet (0.4 mg total) under the tongue every 5 (five) minutes as needed for chest pain.   ondansetron 4 MG disintegrating tablet Commonly known as:  ZOFRAN-ODT Take 1 tablet (4 mg total) by mouth every 8 (eight) hours as needed for nausea or vomiting.   pantoprazole 40 MG tablet Commonly known as:  PROTONIX Take 1 tablet (40 mg total) by mouth daily at 6 (six) AM.   PRALUENT 75 MG/ML Sopn Generic drug:  Alirocumab Inject 1 pen into the skin every 14 (fourteen) days. STUDY MEDICATION   promethazine 12.5 MG tablet Commonly known as:  PHENERGAN Take 1 tablet (12.5 mg total) by mouth every 6 (six) hours. What changed:  when to take this  reasons to take this  Another medication with the same name was removed. Continue taking this medication, and follow the directions you see here.   senna-docusate 8.6-50 MG tablet Commonly known as:  Senokot-S Take 2 tablets by mouth 2 (two) times daily as needed for constipation.   Vitamin D3 50000 units Caps Take 1 capsule by mouth 2 (two) times a week. Sunday AND  Wednesday  Discharge Care Instructions        Start     Ordered   10/07/16 0000  pantoprazole (PROTONIX) 40 MG tablet  Daily     10/06/16 1645   10/06/16 0000  feeding supplement, ENSURE ENLIVE, (ENSURE ENLIVE) LIQD  2 times daily between meals     10/06/16 1645   10/06/16 0000  promethazine (PHENERGAN) 12.5 MG tablet  Every 6 hours     10/06/16 1645   10/06/16 0000  Increase activity slowly     10/06/16 1645   10/06/16 0000  Diet - low sodium heart healthy     10/06/16 1645   10/06/16 0000  Diet Carb Modified     10/06/16 1645     Follow-up Information    Lynnell Jude, MD Follow up in 1 week(s).   Specialty:  Family Medicine Contact information: [address] [place] [address] [phone]        please keep appointment with GI on Monday Follow up.            Time coordinating discharge: 35 min  Signed:  Tilman Mcclaren U Darrien Laakso   [facility] 10/06/2016, 4:47 PM

## 2016-10-06 NOTE — Progress Notes (Signed)
1845. Pt discharges home. Son picked patient up. Discharge summary discussed with Patient which included meds and next doses. Pt skin fully intact upon discharge and pt alert and oriented x4 and no nausea upon discharge

## 2016-10-06 NOTE — Progress Notes (Addendum)
PROGRESS NOTE    [name]  DGU:[id number] DOB: [birth year] DOA: 10/04/2016 PCP: Lynnell Jude, MD   Outpatient Specialists:     Brief Narrative:  [name] is a 76 y.o. female with medical history significant of CAD, HTN, DM and lap band surgery at [facility] > 10 years ago.  She was recently hospitalized at [facility] with intractable N/V.  She was placed on IV reglan and states that she was able to eat as long as she was getting that medication.  She was to have a diabetic gastroparesis study done but was not made NPO so this was to be arranged as an outpatient.  She returns here with persistent nausea and dry heaving.  She has not followed up with her lap band surgeon at [facility] in a number of years but did recently see Dr. Redmond Pulling with [place]. [name] has lost 11lbs in the last 11 days.  She had an EGD done in March of 2018 with Dr. Ardis Hughs: erosion at the site of her lap band and gastritis.  She states she had also had a colonoscopy before showing diverticulosis. Work up including Gastric emptying has been negative-- did not tolerate reglan but has improved on scheduled phenergan-- advancing diet and will get CT head to r/o mass.  Home possibly in AM.   Assessment & Plan:   Active Problems:   H/O gastric bypass   Hypertension   Nausea & vomiting   Nausea and vomiting   Uncontrolled N/V -nuclear medicine negative for gastroparesis -reglan made patient "jittery" -GI consult appreciated -advance diet as tolerated -scheduled phenergan -CT scan of brain--to r/o mass causing N/V-- patient only able to have open MRI  Chronic pain -on fentayl patch -denies constipation  S/p gastric banding -? Need to follow up with surgeon for band changes/removal -seen by Dr. Redmond Pulling     DVT prophylaxis:  Lovenox   Code Status: Full Code   Family Communication:   Disposition Plan:  Advance diet   Consultants:   GI  Procedures:    Subjective: Tolerated clears +  flatus   Objective: Vitals:   10/06/16 0532 10/06/16 0749 10/06/16 0835 10/06/16 0900  BP: (!) 162/43 (!) 126/42 (!) 131/54 (!) 139/45  Pulse: 67 64 64   Resp:      Temp: 98 F (36.7 C)  98.4 F (36.9 C)   TempSrc: Oral  Oral   SpO2: 100%  99%   Weight:      Height:        Intake/Output Summary (Last 24 hours) at 10/06/16 1249 Last data filed at 10/06/16 1200  Gross per 24 hour  Intake              120 ml  Output              200 ml  Net              -80 ml   Filed Weights   10/05/16 0300  Weight: 73.3 kg (161 lb 11.2 oz)    Examination:  General exam: NAD Respiratory system: clear, no wheezing Cardiovascular system: rrr Gastrointestinal system: NT Central nervous system: A+Ox3 Extremities: moves all 4 ext Psychiatry: mood improved    Data Reviewed: I have personally reviewed following labs and imaging studies  CBC:  Recent Labs Lab 10/04/16 0955 10/05/16 0757  WBC 8.7 8.5  HGB 12.3 10.5*  HCT 39.0 33.8*  MCV 84.8 85.4  PLT 209 956   Basic Metabolic  Panel:  Recent Labs Lab 10/04/16 0955 10/05/16 0757  NA 137 137  K 4.2 3.7  CL 101 105  CO2 26 23  GLUCOSE 190* 107*  BUN 11 11  CREATININE 1.14* 1.11*  CALCIUM 9.4 8.3*   GFR: Estimated Creatinine Clearance: 41.1 mL/min (A) (by C-G formula based on SCr of 1.11 mg/dL (H)). Liver Function Tests:  Recent Labs Lab 10/04/16 0955 10/05/16 0757  AST 21 14*  ALT 14 11*  ALKPHOS 44 36*  BILITOT 0.8 1.0  PROT 6.8 5.3*  ALBUMIN 4.0 3.1*    Recent Labs Lab 10/04/16 0955  LIPASE 24   No results for input(s): AMMONIA in the last 168 hours. Coagulation Profile: No results for input(s): INR, PROTIME in the last 168 hours. Cardiac Enzymes: No results for input(s): CKTOTAL, CKMB, CKMBINDEX, TROPONINI in the last 168 hours. BNP (last 3 results) No results for input(s): PROBNP in the last 8760 hours. HbA1C: No results for input(s): HGBA1C in the last 72 hours. CBG:  Recent Labs Lab  10/05/16 2022 10/06/16 0009 10/06/16 0528 10/06/16 0811 10/06/16 1157  GLUCAP 156* 127* 96 112* 184*   Lipid Profile: No results for input(s): CHOL, HDL, LDLCALC, TRIG, CHOLHDL, LDLDIRECT in the last 72 hours. Thyroid Function Tests:  Recent Labs  10/06/16 0534  TSH 3.675   Anemia Panel: No results for input(s): VITAMINB12, FOLATE, FERRITIN, TIBC, IRON, RETICCTPCT in the last 72 hours. Urine analysis:    Component Value Date/Time   COLORURINE YELLOW 10/04/2016 1420   APPEARANCEUR HAZY (A) 10/04/2016 1420   APPEARANCEUR Clear 04/05/2016 0857   LABSPEC 1.015 10/04/2016 1420   PHURINE 5.0 10/04/2016 1420   GLUCOSEU NEGATIVE 10/04/2016 1420   HGBUR NEGATIVE 10/04/2016 1420   BILIRUBINUR NEGATIVE 10/04/2016 1420   BILIRUBINUR Negative 04/05/2016 0857   KETONESUR 20 (A) 10/04/2016 1420   PROTEINUR NEGATIVE 10/04/2016 1420   NITRITE NEGATIVE 10/04/2016 1420   LEUKOCYTESUR TRACE (A) 10/04/2016 1420   LEUKOCYTESUR Negative 04/05/2016 0857     ) Recent Results (from the past 240 hour(s))  Urine culture     Status: Abnormal (Preliminary result)   Collection Time: 10/04/16  2:21 PM  Result Value Ref Range Status   Specimen Description URINE, RANDOM  Final   Special Requests NONE  Final   Culture >=100,000 COLONIES/mL ESCHERICHIA COLI (A)  Final   Report Status PENDING  Incomplete      Anti-infectives    None       Radiology Studies: Nm Gastric Emptying  Result Date: 10/05/2016 CLINICAL DATA:  Nausea, vomiting, post laparoscopic band surgery in 2007, onset of symptoms in March 2018, history of hypertension, type II diabetes mellitus EXAM: NUCLEAR MEDICINE GASTRIC EMPTYING SCAN TECHNIQUE: After oral ingestion of radiolabeled meal, sequential abdominal images were obtained for 2 hours. Percentage of activity emptying the stomach was calculated at 1 hour and at 2 hours. RADIOPHARMACEUTICALS:  2.0 mCi Tc-44m sulfur colloid in standardized meal COMPARISON:  None. FINDINGS:  Expected location of the stomach in the left upper quadrant. Ingested meal empties the stomach gradually over the course of the study. 41% emptying at 1 hour. 96% emptying at 2 hours. Findings represent normal gastric emptying. IMPRESSION: Normal gastric emptying study. Electronically Signed   By: Lavonia Dana M.D.   On: 10/05/2016 17:53   Dg Abdomen Acute W/chest  Result Date: 10/04/2016 CLINICAL DATA:  Nausea for 1 month EXAM: DG ABDOMEN ACUTE W/ 1V CHEST COMPARISON:  09/12/2016, CT 09/12/2016 FINDINGS: Single-view chest demonstrates post sternotomy changes.  Normal cardiomediastinal silhouette with atherosclerosis. No pneumothorax. Supine and upright views of the abdomen demonstrate gastric band. No free air beneath the diaphragm. Nonobstructed bowel-gas pattern. Mild scoliosis of the spine. Calcified phleboliths in the pelvis. Surgical clips in the left groin and partially visualized stent in the right groin. IMPRESSION: 1. No radiographic evidence for acute cardiopulmonary abnormality. 2. Nonobstructed bowel-gas pattern Electronically Signed   By: Donavan Foil M.D.   On: 10/04/2016 16:24        Scheduled Meds: . enoxaparin (LOVENOX) injection  40 mg Subcutaneous Q24H  . feeding supplement (ENSURE ENLIVE)  237 mL Oral BID BM  . fentaNYL  50 mcg Transdermal Q72H  . fluticasone  2 spray Each Nare Daily  . insulin aspart  0-9 Units Subcutaneous Q4H  . isosorbide mononitrate  60 mg Oral Daily  . pantoprazole  40 mg Oral Q0600  . promethazine  12.5 mg Intravenous Q6H   Continuous Infusions: . sodium chloride 50 mL/hr at 10/05/16 2121     LOS: 1 day    Time spent: 25 min    [place], DO [facility] Pager [phone]  If 7PM-7AM, please contact night-coverage [email or web address] Password [facility] 10/06/2016, 12:49 PM

## 2016-10-06 NOTE — Progress Notes (Signed)
Per night shift nurse patient states reglan made jittery. When PA rounded already d/c med

## 2016-10-07 ENCOUNTER — Other Ambulatory Visit (HOSPITAL_COMMUNITY): Payer: Medicare Other

## 2016-10-07 LAB — FOLATE RBC
FOLATE, RBC: 1553 ng/mL (ref >498)
Folate, Hemolysate: 509.4 ng/mL
HEMATOCRIT: 32.8 % — AB (ref 34.0–46.6)

## 2016-10-07 LAB — URINE CULTURE

## 2016-10-07 LAB — VITAMIN D 25 HYDROXY (VIT D DEFICIENCY, FRACTURES): Vit D, 25-Hydroxy: 80.9 ng/mL (ref 30.0–100.0)

## 2016-10-08 LAB — ZINC: ZINC: 76 ug/dL (ref 56–134)

## 2016-10-08 LAB — COPPER, SERUM: COPPER: 107 ug/dL (ref 72–166)

## 2016-10-11 ENCOUNTER — Encounter: Payer: Self-pay | Admitting: Physician Assistant

## 2016-10-11 ENCOUNTER — Ambulatory Visit (INDEPENDENT_AMBULATORY_CARE_PROVIDER_SITE_OTHER): Payer: Medicare Other | Admitting: Physician Assistant

## 2016-10-11 VITALS — BP 138/54 | HR 65 | Ht 62.0 in | Wt 157.8 lb

## 2016-10-11 DIAGNOSIS — I2510 Atherosclerotic heart disease of native coronary artery without angina pectoris: Secondary | ICD-10-CM | POA: Diagnosis not present

## 2016-10-11 DIAGNOSIS — R110 Nausea: Secondary | ICD-10-CM | POA: Diagnosis not present

## 2016-10-11 MED ORDER — ONDANSETRON 8 MG PO TBDP: 8.0000 mg | ORAL_TABLET | Freq: Three times a day (TID) | ORAL | 2 refills | Status: DC | PRN

## 2016-10-11 NOTE — Patient Instructions (Signed)
We have sent the following medications to your pharmacy for you to pick up at your convenience: Zofran 8 mg every 6 hours as needed for nausea

## 2016-10-11 NOTE — Progress Notes (Signed)
Chief Complaint: Chronic nausea  HPI:    [name] is a 76 year old Caucasian female with a past medical history as listed below who presents to clinic today for follow-up after being seen in the hospital by our service for her chronic nausea.    Please recall patient follows with Dr. Ardis Hughs. Patient has a large cardiac history and also history of multiple myeloma diagnosed in 2015 treated with radiation and chemotherapy. The patient stopped chemotherapy due to side effects/intolerance after a year or so. She follows with [facility] and has been told she is in remission. 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 She is on a fentanyl patch as well as Lyrica and Neurontin. Patient has also had a lap band placed in 2007 resulting in 100 pound permanent weight loss.     Patient's last EGD was performed 03/2016 by Dr. Ardis Hughs for nausea vomiting weight loss and abdominal pain. There was mild inflammation characterized by erythema and friability in the gastric antrum. There was a small amount of hematin in the stomach at the outset of the procedure. Normal-appearing deformity from previous lap band and proximal stomach. Slightly irregular mucosa that was oozing blood actively. Bleeding started during examination without evident trauma to the area. Endoscopic clip was placed.     04/12/2016 patient had esophagram with a mild tertiary contractions in the mid and distal esophagus. No hiatal hernia and no reflux.     04/29/16 patient was referred to [place]. There is felt no indication for lab and vacation. He was concerned intermittent right upper quadrant pain with fatty foods trigger could be biliary in order to HIDA scan.     05/10/17 HIDA scan was normal. At time of consult patient describes 6 weeks of nonbloody nausea/vomiting and dry heaves. Patient had had a recent admission to [facility] 9/8-9/11. GES was recommended that time patient did not tolerate  Reglan. GES was never performed. During recent admission patient had a CT abdomen and pelvis 09/12/16 which showed gastric lab and in situ.     09/17/16 she had an abdominal ultrasound with no gallstones or gallbladder inflammation.     10/05/16 gastric emptying study was normal.     10/06/16 patient had a CT of the head without contrast which did not show any etiology for nausea. Patient was followed by Dr. Havery Moros during recent hospitalization and thought that possibly her fentanyl patch shortness adding to her nausea. Patient was doing "okay" on scheduled Phenergan every 6 hours. This was continued at time of discharge and patient was placed on Pantoprazole 40 mg daily.    Today, the patient explains that she has been using her Phenergan every 6 hours but this is no longer helping. She has even been waking up in the middle the night to take her night dose due to constant nausea. She denies any episodes of vomiting recently. She has been using Pantoprazole every morning. Patient tells me she would like to try Zofran today, as she feels as though this may work slightly better. Patient also describes that she has an appointment with [facility] to discuss her Fentanyl patch in the future as this was thought to possibly be giving her nausea. They're going to try something else. This appointment is next week. Patient also tells me today that she sees a correlation in timing between when she recently got her "gel nails applied". Apparently they used a "different substance than normal" and patient believes she became nauseous ever  since application of these nails. She plans to get them removed later today as she feels she may have an allergy to them.   Patient denies fever, chills, blood in her stool, melena, heartburn or reflux.   Past Medical History:  Diagnosis Date  . Anginal pain ([place])   . Anxiety   . Arthritis    "shoulders primarily" (01/13/2016)  . CAD (coronary artery disease)    s/p CABG in Oct 2017  . CHF  (congestive heart failure) ([place])   . Chronic pain    from her multiple myeloma but says that her pain is usually in her legs/notes 01/13/2016  . High cholesterol   . History of blood transfusion    "numerous; related to procedures for my heart" (01/13/2016)  . Hypertension   . Ischemic cardiomyopathy    /nots 01/13/2016  . MI (myocardial infarction) ([place])    EKG on arrival 01/13/2016 showed NSR with evidence of old anterior and inferior infarcts  . MI (myocardial infarction) ([place]) 08/2015  . Multiple myeloma ([place]) dx'd 2015   off chemo since 2016  . Nausea and vomiting 05/10/2016   3-4 month hx of nausea and vomiting  . PAD (peripheral artery disease) ([place])   . Stroke [facility])    "several in 1 year; ? year" (01/13/2016)  . Type II diabetes mellitus ([place])     Past Surgical History:  Procedure Laterality Date  . CARDIAC CATHETERIZATION N/A 01/14/2016   Procedure: Left Heart Cath and Cors/Grafts Angiography;  Surgeon: Burnell Blanks, MD;  Location: [facility];  Service: Cardiovascular;  Laterality: N/A;  . CARPAL TUNNEL RELEASE Left   . CORONARY ANGIOPLASTY WITH STENT PLACEMENT  05/2015   DES placed to the mid and distal LAD as well as OM2/notes 01/13/2016  . CORONARY ANGIOPLASTY WITH STENT PLACEMENT  ~ 2010  . CORONARY ARTERY BYPASS GRAFT  11/11/2015   CABG X2 LIMA-LAD and SVG-OM/notes 01/13/2016  . DILATION AND CURETTAGE OF UTERUS    . FEMORAL-POPLITEAL BYPASS GRAFT Left 2013   Archie Endo 01/13/2016  . LAPAROSCOPIC GASTRIC BANDING  2007  . RENAL ARTERY STENT    . VAGINAL HYSTERECTOMY      Current Outpatient Prescriptions  Medication Sig Dispense Refill  . Alirocumab (PRALUENT) 75 MG/ML SOPN Inject 1 pen into the skin every 14 (fourteen) days. STUDY MEDICATION 2 pen 11  . aspirin 81 MG tablet Take 81 mg by mouth daily.    . Cholecalciferol (VITAMIN D3) 50000 units CAPS Take 1 capsule by mouth 2 (two) times a week. Sunday AND Wednesday    . diclofenac sodium (VOLTAREN) 1 % GEL Apply  2 g topically daily as needed for pain.    . feeding supplement, ENSURE ENLIVE, (ENSURE ENLIVE) LIQD Take 237 mLs by mouth 2 (two) times daily between meals. 237 mL 12  . fentaNYL (DURAGESIC - DOSED MCG/HR) 50 MCG/HR Place 50 mcg onto the skin every 3 (three) days.     . fluticasone (FLONASE) 50 MCG/ACT nasal spray Place 2 sprays into both nostrils daily as needed for allergies.     Marland Kitchen gabapentin (NEURONTIN) 300 MG capsule Take 300-600 mg by mouth See admin instructions. Pt takes 631m in am, 3057mat bedtime    . hydrocortisone cream 1 % Apply topically as needed for itching. 30 g 0  . lisinopril (PRINIVIL,ZESTRIL) 5 MG tablet Take 1 tablet (5 mg total) by mouth daily. 30 tablet 12  . metFORMIN (GLUCOPHAGE) 500 MG tablet Take 500 mg by mouth  2 (two) times daily with a meal.     . nitroGLYCERIN (NITROSTAT) 0.4 MG SL tablet Place 1 tablet (0.4 mg total) under the tongue every 5 (five) minutes as needed for chest pain. 25 tablet 2  . Omega-3 Fatty Acids (FISH OIL) 1000 MG CAPS Take 2 capsules (2,000 mg total) by mouth daily. (Patient taking differently: Take 1,000 mg by mouth daily. ) 60 capsule 0  . pantoprazole (PROTONIX) 40 MG tablet Take 1 tablet (40 mg total) by mouth daily at 6 (six) AM. 30 tablet 0  . promethazine (PHENERGAN) 12.5 MG tablet Take 1 tablet (12.5 mg total) by mouth every 6 (six) hours. 100 tablet 0  . senna-docusate (SENOKOT-S) 8.6-50 MG tablet Take 2 tablets by mouth 2 (two) times daily as needed for constipation.    . furosemide (LASIX) 20 MG tablet Take 1 tablet (20 mg total) by mouth daily as needed for fluid or edema. 30 tablet 3  . isosorbide mononitrate (IMDUR) 60 MG 24 hr tablet Take 1 tablet (60 mg total) by mouth daily. 90 tablet 3  . metoprolol tartrate (LOPRESSOR) 25 MG tablet Take 0.5 tablets (12.5 mg total) by mouth 2 (two) times daily. 90 tablet 3  . ondansetron (ZOFRAN ODT) 8 MG disintegrating tablet Take 1 tablet (8 mg total) by mouth every 8 (eight) hours as needed  for nausea or vomiting. 120 tablet 2   Current Facility-Administered Medications  Medication Dose Route Frequency Provider Last Rate Last Dose  . 0.9 %  sodium chloride infusion  500 mL Intravenous Continuous Milus Banister, MD        Allergies as of 10/11/2016 - Review Complete 10/11/2016  Allergen Reaction Noted  . Other Swelling 09/27/2014  . Statins Swelling 11/29/2015  . Metoclopramide Other (See Comments) 10/06/2016  . Amoxicillin  07/26/2015  . Codeine  07/26/2015  . Sulfa antibiotics  01/13/2016  . Duloxetine Nausea Only 07/07/2015    Family History  Problem Relation Age of Onset  . Diabetes Mother   . Heart failure Mother   . Prostate cancer Neg Hx   . Kidney cancer Neg Hx   . Bladder Cancer Neg Hx   . Colon cancer Neg Hx   . Stomach cancer Neg Hx     Social History   Social History  . Marital status: Divorced    Spouse name: N/A  . Number of children: 2  . Years of education: N/A   Occupational History  . retired    Social History Main Topics  . Smoking status: Never Smoker  . Smokeless tobacco: Never Used  . Alcohol use Yes     Comment: occasional wine  . Drug use: No  . Sexual activity: Not Currently    Partners: Male   Other Topics Concern  . Not on file   Social History Narrative   Independent at baseline. Ambulates with the help of a walker    Review of Systems:    Constitutional: No fever or chills Cardiovascular: No chest pain Respiratory: No SOB Gastrointestinal: See HPI and otherwise negative   Physical Exam:  Vital signs: BP (!) 138/54   Pulse 65   Ht 5' 2" (1.575 m)   Wt 157 lb 12.8 oz (71.6 kg)   BMI 28.86 kg/m   Constitutional:   Pleasant Elderly Caucasian female appears to be in NAD, Well developed, Well nourished, alert and cooperative Respiratory: Respirations even and unlabored. Lungs clear to auscultation bilaterally.   No wheezes, crackles, or rhonchi.  Cardiovascular: Normal S1, S2. No MRG. Regular rate and  rhythm. No peripheral edema, cyanosis or pallor.  Gastrointestinal:  Soft, nondistended, nontender. No rebound or guarding. Normal bowel sounds. No appreciable masses or hepatomegaly. Rectal:  Not performed.  Msk:  Symmetrical without gross deformities. Without edema, no deformity or joint abnormality. Uses walker to ambulate Psychiatric:  Demonstrates good judgement and reason without abnormal affect or behaviors.  RELEVANT LABS AND IMAGING: CBC    Component Value Date/Time   WBC 8.5 10/05/2016 0757   RBC 3.96 10/05/2016 0757   HGB 10.5 (L) 10/05/2016 0757   HGB 10.9 (L) 06/03/2016 1102   HCT 32.8 (L) 10/06/2016 1608   PLT 168 10/05/2016 0757   PLT 256 06/03/2016 1102   MCV 85.4 10/05/2016 0757   MCV 83 06/03/2016 1102   MCH 26.5 10/05/2016 0757   MCHC 31.1 10/05/2016 0757   RDW 15.0 10/05/2016 0757   RDW 15.8 (H) 06/03/2016 1102   LYMPHSABS 6.5 (H) 06/03/2016 1102   MONOABS 0.5 04/06/2016 1120   EOSABS 0.2 06/03/2016 1102   BASOSABS 0.0 06/03/2016 1102    CMP     Component Value Date/Time   NA 137 10/05/2016 0757   NA 138 06/03/2016 1102   K 3.7 10/05/2016 0757   CL 105 10/05/2016 0757   CO2 23 10/05/2016 0757   GLUCOSE 107 (H) 10/05/2016 0757   BUN 11 10/05/2016 0757   BUN 18 06/03/2016 1102   CREATININE 1.11 (H) 10/05/2016 0757   CALCIUM 8.3 (L) 10/05/2016 0757   PROT 5.3 (L) 10/05/2016 0757   PROT 6.2 06/03/2016 1102   ALBUMIN 3.1 (L) 10/05/2016 0757   ALBUMIN 4.3 06/03/2016 1102   AST 14 (L) 10/05/2016 0757   ALT 11 (L) 10/05/2016 0757   ALKPHOS 36 (L) 10/05/2016 0757   BILITOT 1.0 10/05/2016 0757   BILITOT <0.2 06/03/2016 1102   GFRNONAA 47 (L) 10/05/2016 0757   GFRAA 55 (L) 10/05/2016 0757   EXAM: CT ABDOMEN AND PELVIS WITH CONTRAST 09/12/16  TECHNIQUE: Multidetector CT imaging of the abdomen and pelvis was performed using the standard protocol following bolus administration of intravenous contrast.  CONTRAST:  65m ISOVUE-300 IOPAMIDOL  (ISOVUE-300) INJECTION 61%  COMPARISON:  03/28/2016  FINDINGS: Lower chest: Scattered coronary calcifications. No pleural or pericardial effusion. Visualized lung bases clear. Previous median sternotomy.  Hepatobiliary: Stable subcentimeter probable cysts in hepatic segment 5. No new hepatic lesion. Gallbladder is incompletely distended without calcified stones. No biliary dilatation.  Pancreas: Unremarkable. No pancreatic ductal dilatation or surrounding inflammatory changes.  Spleen: Normal in size without focal abnormality.  Adrenals/Urinary Tract: Adrenal glands are unremarkable. Kidneys are normal, without renal calculi, focal lesion, or hydronephrosis. Gas bubble in the nondilated urinary bladder presumably from recent instrumentation.  Stomach/Bowel: Gastric lap band apparatus projects in expected location, appears grossly intact. Stomach is nondilated. Duodenal diverticulum. Remainder small bowel is decompressed. Appendix not discretely identified. Scattered diverticula most numerous in the sigmoid segment, without significant adjacent inflammatory/ edematous change.  Vascular/Lymphatic: Aortoiliac atheromatous calcifications without aneurysm. Surgical clips overlie the left common femoral vessels. Occluded stent in the proximal visualized left SFA. Patent stent in the proximal visualized right SFA.  No abdominal or pelvic adenopathy localized.  Portal vein patent  Reproductive:  Status post hysterectomy. No adnexal masses.  Other: Trace free pelvic fluid.  No free air.  Musculoskeletal: Tiny umbilical hernia containing only mesenteric fat. Degenerative disc disease L4-S1. Negative for fracture or worrisome bone lesion.  IMPRESSION: 1. No acute findings. 2.  Sigmoid diverticulosis. 3. Coronary and Aortic Atherosclerosis (ICD10-170.0) with occluded left SFA stent noted.   Electronically Signed   By: Lucrezia Europe M.D.   On: 09/12/2016  14:09  EXAM: ULTRASOUND ABDOMEN LIMITED RIGHT UPPER QUADRANT 09/17/16  COMPARISON:  Abdominal CT 09/12/2016. Abdominal ultrasound 02/10/2016  FINDINGS: Gallbladder:  Physiologically distended. No gallstones or wall thickening visualized. No sonographic Murphy sign noted by sonographer.  Common bile duct:  Diameter: 5 mm, normal.  Liver:  No focal lesion identified. The tiny subcentimeter low-density lesion in the liver on prior CT is not well seen sonographically. Heterogeneous in parenchymal echogenicity. Portal vein is patent on color Doppler imaging with normal direction of blood flow towards the liver.  Shadowing subcutaneous port seen in the anterior abdomen, post gastric banding.  IMPRESSION: 1. No gallstones or gallbladder inflammation. No biliary dilatation. 2. Heterogeneous hepatic parenchyma without discrete lesion.   Electronically Signed   By: Jeb Levering M.D.   On: 09/17/2016 19:50  EXAM: DG ABDOMEN ACUTE W/ 1V CHEST 10/04/16  COMPARISON:  09/12/2016, CT 09/12/2016  FINDINGS: Single-view chest demonstrates post sternotomy changes. Normal cardiomediastinal silhouette with atherosclerosis. No pneumothorax.  Supine and upright views of the abdomen demonstrate gastric band. No free air beneath the diaphragm. Nonobstructed bowel-gas pattern. Mild scoliosis of the spine. Calcified phleboliths in the pelvis. Surgical clips in the left groin and partially visualized stent in the right groin.  IMPRESSION: 1. No radiographic evidence for acute cardiopulmonary abnormality. 2. Nonobstructed bowel-gas pattern   Electronically Signed   By: Donavan Foil M.D.   On: 10/04/2016 16:24  EXAM: NUCLEAR MEDICINE GASTRIC EMPTYING SCAN 10/05/16  TECHNIQUE: After oral ingestion of radiolabeled meal, sequential abdominal images were obtained for 2 hours. Percentage of activity emptying the stomach was calculated at 1 hour and at 2  hours.  RADIOPHARMACEUTICALS:  2.0 mCi Tc-1msulfur colloid in standardized meal  COMPARISON:  None.  FINDINGS: Expected location of the stomach in the left upper quadrant.  Ingested meal empties the stomach gradually over the course of the study.  41% emptying at 1 hour.  96% emptying at 2 hours.  Findings represent normal gastric emptying.  IMPRESSION: Normal gastric emptying study.   Electronically Signed   By: MLavonia DanaM.D.   On: 10/05/2016 17:53   EXAM: CT HEAD WITHOUT CONTRAST 10/06/16  TECHNIQUE: Contiguous axial images were obtained from the base of the skull through the vertex without intravenous contrast.  COMPARISON:  07/26/2015.  FINDINGS: Brain: Mild age-appropriate cortical, deep and cerebellar atrophy, unchanged. Mild changes of small vessel disease of the white matter diffusely, unchanged. No mass lesion. No midline shift. No acute hemorrhage or hematoma. No extra-axial fluid collections. No evidence of acute infarction.  Vascular: Severe bilateral carotid siphon and bilateral vertebral artery atherosclerosis. No hyperdense vessel.  Skull: No skull fracture or other focal osseous abnormality involving the skull.  Sinuses/Orbits: Visualized paranasal sinuses, bilateral mastoid air cells and bilateral middle ear cavities well-aerated. Visualized orbits and globes are normal.  Other: None.  IMPRESSION: 1. No acute intracranial abnormality. 2. Stable mild generalized atrophy and mild chronic microvascular ischemic changes of the white matter. 3. Severe bilateral carotid siphon and bilateral vertebral artery atherosclerosis.   Electronically Signed   By: TEvangeline DakinM.D.   On: 10/06/2016 14:31   Assessment: 1. Chronic nausea: Extensive workup including EGD, HIDA scan, right upper quadrant ultrasound, CT scan abdomen, GES and CT scan head, all of which has been negative/normal, some concern that fentanyl  patch  may be contributing  Plan: 1. Keep appointment to discuss Fentanyl patch at [facility] 2. Prescribed Zofran 8 mg ODT every 6 hours #120 with 2 refills 3. Continue Protonix 40 mg every morning 4. Discussed with the patient that if the timing fits the start of her nausea and her nail application, it is not going to hurt her to get these removed, though this would be a "new one for me " 5. Patient to follow in clinic with Dr. Ardis Hughs in 2-3 months.  Ellouise Newer, PA-C [facility] 10/11/2016, 12:05 PM  Cc: Lynnell Jude, MD

## 2016-10-11 NOTE — Progress Notes (Signed)
I agree with the above note, plan 

## 2016-10-25 ENCOUNTER — Emergency Department (HOSPITAL_COMMUNITY)
Admission: EM | Admit: 2016-10-25 | Discharge: 2016-10-25 | Discharge status: Against Medical Advice | Payer: Medicare Other | Attending: Emergency Medicine | Admitting: Emergency Medicine

## 2016-10-25 ENCOUNTER — Telehealth: Payer: Self-pay | Admitting: Cardiology

## 2016-10-25 ENCOUNTER — Emergency Department (HOSPITAL_COMMUNITY): Payer: Medicare Other

## 2016-10-25 ENCOUNTER — Encounter (HOSPITAL_COMMUNITY): Payer: Self-pay | Admitting: *Deleted

## 2016-10-25 DIAGNOSIS — Z5321 Procedure and treatment not carried out due to patient leaving prior to being seen by health care provider: Secondary | ICD-10-CM | POA: Diagnosis not present

## 2016-10-25 DIAGNOSIS — I499 Cardiac arrhythmia, unspecified: Secondary | ICD-10-CM | POA: Insufficient documentation

## 2016-10-25 LAB — CBC
HCT: 35.6 % — ABNORMAL LOW (ref 36.0–46.0)
Hemoglobin: 11.3 g/dL — ABNORMAL LOW (ref 12.0–15.0)
MCH: 26.7 pg (ref 26.0–34.0)
MCHC: 31.7 g/dL (ref 30.0–36.0)
MCV: 84 fL (ref 78.0–100.0)
PLATELETS: 184 10*3/uL (ref 150–400)
RBC: 4.24 MIL/uL (ref 3.87–5.11)
RDW: 14.6 % (ref 11.5–15.5)
WBC: 9.7 10*3/uL (ref 4.0–10.5)

## 2016-10-25 LAB — BASIC METABOLIC PANEL
Anion gap: 12 (ref 5–15)
BUN: 16 mg/dL (ref 6–20)
CALCIUM: 9.4 mg/dL (ref 8.9–10.3)
CHLORIDE: 101 mmol/L (ref 101–111)
CO2: 22 mmol/L (ref 22–32)
CREATININE: 1.25 mg/dL — AB (ref 0.44–1.00)
GFR calc non Af Amer: 41 mL/min — ABNORMAL LOW (ref >60)
GFR, EST AFRICAN AMERICAN: 48 mL/min — AB (ref >60)
Glucose, Bld: 188 mg/dL — ABNORMAL HIGH (ref 65–99)
Potassium: 4.7 mmol/L (ref 3.5–5.1)
Sodium: 135 mmol/L (ref 135–145)

## 2016-10-25 LAB — I-STAT TROPONIN, ED: TROPONIN I, POC: 0 ng/mL (ref 0.00–0.08)

## 2016-10-25 NOTE — ED Triage Notes (Signed)
Pt in via [facility], reports pounding in chest onset x 2 days, pt called cardiology and told to come here,no SOB reported, pt had 324 mg ASA pta

## 2016-10-25 NOTE — Telephone Encounter (Signed)
Spoke with the pt, and she is now complaining of chest pain, sob, N/V, diaphoresis, and took 2 nitroglycerin with no relief. Informed the pt that given her complaints and taking 2 nitroglycerin with no relief, Dr Meda Coffee recommends that she call 911 now and refer to [facility] ER for further chest pain evaluation.  Pt verbalized understanding and agrees with this plan.

## 2016-10-25 NOTE — Telephone Encounter (Signed)
Pt is calling to report she has had 2 more episodes since she endorsed her complaints earlier this morning. Will update Dr Meda Coffee about this.

## 2016-10-25 NOTE — Telephone Encounter (Signed)
Please ask her to go to ER

## 2016-10-25 NOTE — Telephone Encounter (Signed)
Follow up     Pt is calling about this. She said she has had two more episodes since she spoke to the nurse this morning.

## 2016-10-25 NOTE — Telephone Encounter (Signed)
New message        Patient c/o Palpitations:  High priority if patient c/o lightheadedness, shortness of breath, or chest pain  1) How long have you had palpitations/irregular HR/ Afib? Are you having the symptoms now? No symptoms now, it comes and goes   2) Are you currently experiencing lightheadedness, SOB or CP? Sob all 3 times it happened   3) Do you have a history of afib (atrial fibrillation) or irregular heart rhythm?  no  4) Have you checked your BP or HR? (document readings if available): 170/62 yesterday - doesn't know how to check her heart rate   Are you experiencing any other symptoms? Over the weekend on 3 different occasion she had 3 different tachycardia events, she had to take Nitro to get them to calm down .  She is unable to eat and has nausea , no chest pains or other symptoms   5) 170/62 bp yesterday

## 2016-10-25 NOTE — Telephone Encounter (Signed)
Pt calling to report that over this past weekend, she had a couple episodes of palpitations accompanied with SOB with each episode.  Pt states that this happened Saturday and Sunday.  Pt states that this happened in the afternoon both days.  Pt states that she took 1 nitro yesterday afternoon, and this relieved her symptoms completely. Pt reports that over the past 2 days, she has had some nausea, and very diminished appetite.  Pt reports she is asymptomatic today. Pt would like for Dr Meda Coffee to be aware of this and to further advise on what she should do. Informed the pt that Dr Meda Coffee and myself are both in the clinic right now, but I will route this message to her for further review and recommendation and follow-up with the pt shortly thereafter. Pt verbalized understanding and agrees with this plan.

## 2016-10-25 NOTE — ED Notes (Signed)
Pt up to nurse first stating she wants to leave, apologized for wait. LWBS.

## 2016-10-25 NOTE — Telephone Encounter (Signed)
Left a detailed message for the pt to call back.  Left a detailed message on the pts confirmed VM that if she is having active complaints with symptoms already mentioned in this message, then she should call back and request to speak with a triage Nurse.

## 2016-12-21 ENCOUNTER — Ambulatory Visit: Payer: Medicare Other | Admitting: Gastroenterology

## 2016-12-22 ENCOUNTER — Telehealth: Payer: Self-pay | Admitting: Pharmacist

## 2016-12-22 NOTE — Telephone Encounter (Signed)
Thank you [name]

## 2016-12-22 NOTE — Telephone Encounter (Signed)
Spoke with pt regarding Praluent - she is tolerating the injections well and her LDL looks excellent and is at goal < 70. Will mail out PASS application so that pt can continue to receive financial support for Praluent 75mg /mL in 2019 year.   Pt states that she has recently switched back to her old cardiologist at [facility] because his office is closer. She will send the completed PASS application to [facility] to assist with Praluent coverage in the future.  Will route to Dr Meda Coffee as an Juluis Rainier.

## 2017-01-14 ENCOUNTER — Ambulatory Visit: Payer: Medicare Other | Admitting: Cardiology

## 2017-03-23 ENCOUNTER — Encounter: Payer: Medicare Other | Attending: Internal Medicine | Admitting: Internal Medicine

## 2017-03-23 DIAGNOSIS — E1142 Type 2 diabetes mellitus with diabetic polyneuropathy: Secondary | ICD-10-CM | POA: Insufficient documentation

## 2017-03-23 DIAGNOSIS — Z7901 Long term (current) use of anticoagulants: Secondary | ICD-10-CM | POA: Diagnosis not present

## 2017-03-23 DIAGNOSIS — I110 Hypertensive heart disease with heart failure: Secondary | ICD-10-CM | POA: Insufficient documentation

## 2017-03-23 DIAGNOSIS — C9000 Multiple myeloma not having achieved remission: Secondary | ICD-10-CM | POA: Diagnosis not present

## 2017-03-23 DIAGNOSIS — I509 Heart failure, unspecified: Secondary | ICD-10-CM | POA: Diagnosis not present

## 2017-03-23 DIAGNOSIS — I252 Old myocardial infarction: Secondary | ICD-10-CM | POA: Diagnosis not present

## 2017-03-23 DIAGNOSIS — E1151 Type 2 diabetes mellitus with diabetic peripheral angiopathy without gangrene: Secondary | ICD-10-CM | POA: Diagnosis not present

## 2017-03-23 DIAGNOSIS — D649 Anemia, unspecified: Secondary | ICD-10-CM | POA: Insufficient documentation

## 2017-03-23 DIAGNOSIS — E11621 Type 2 diabetes mellitus with foot ulcer: Secondary | ICD-10-CM | POA: Insufficient documentation

## 2017-03-23 DIAGNOSIS — Z951 Presence of aortocoronary bypass graft: Secondary | ICD-10-CM | POA: Diagnosis not present

## 2017-03-23 DIAGNOSIS — Z7982 Long term (current) use of aspirin: Secondary | ICD-10-CM | POA: Insufficient documentation

## 2017-03-23 DIAGNOSIS — L97521 Non-pressure chronic ulcer of other part of left foot limited to breakdown of skin: Secondary | ICD-10-CM | POA: Insufficient documentation

## 2017-03-23 DIAGNOSIS — Z880 Allergy status to penicillin: Secondary | ICD-10-CM | POA: Insufficient documentation

## 2017-03-23 DIAGNOSIS — I2510 Atherosclerotic heart disease of native coronary artery without angina pectoris: Secondary | ICD-10-CM | POA: Insufficient documentation

## 2017-03-24 NOTE — Progress Notes (Signed)
DIANCA, OWENSBY ([id number]) Visit Report for 03/23/2017 Abuse/Suicide Risk Screen Details Patient Name: Mroczka, Sharaya A. Date of Service: 03/23/2017 9:45 AM Medical Record Number: [id number] Patient Account Number: [id number] Date of Birth/Sex: Feb 26, 1940 (77 y.o. Female) Treating RN: Roger Shelter Primary Care Lorianna Spadaccini: Lavera [name] Other Clinician: Referring Bronte Sabado: Referral, Self Treating Guelda Batson/Extender: Ricard Dillon Weeks in Treatment: 0 Abuse/Suicide Risk Screen Items Answer ABUSE/SUICIDE RISK SCREEN: Has anyone close to you tried to hurt or harm you recentlyo No Do you feel uncomfortable with anyone in your familyo No Has anyone forced you do things that you didnot want to doo No Do you have any thoughts of harming yourselfo No Patient displays signs or symptoms of abuse and/or neglect. No Electronic Signature(s) Signed: 03/23/2017 11:58:24 AM By: Roger Shelter Entered By: Roger Shelter on 03/23/2017 09:38:29 Wendell, Maleiyah A. ([id number]) -------------------------------------------------------------------------------- Activities of Daily Living Details Patient Name: Barclift, Safina A. Date of Service: 03/23/2017 9:45 AM Medical Record Number: [id number] Patient Account Number: [id number] Date of Birth/Sex: Jun 10, 1940 (77 y.o. Female) Treating RN: Roger Shelter Primary Care Damascus Feldpausch: Lavera [name] Other Clinician: Referring Arash Karstens: Referral, Self Treating Zahria Ding/Extender: Ricard Dillon Weeks in Treatment: 0 Activities of Daily Living Items Answer Activities of Daily Living (Please select one for each item) Drive Automobile Completely Able Take Medications Completely Able Use Telephone Completely Able Care for Appearance Completely Able Use Toilet Completely Able Bath / Shower Completely Able Dress Self Completely Able Feed Self Completely Able Walk Completely Able Get In / Out Bed Completely Able Housework Completely Able Prepare Meals Completely  [place] for Self Completely Able Electronic Signature(s) Signed: 03/23/2017 11:58:24 AM By: Roger Shelter Entered By: Roger Shelter on 03/23/2017 09:38:53 Selsor, Female AMarland Kitchen ([id number]) -------------------------------------------------------------------------------- Education Assessment Details Patient Name: Gruhn, Margy A. Date of Service: 03/23/2017 9:45 AM Medical Record Number: [id number] Patient Account Number: [id number] Date of Birth/Sex: [birth year] (77 y.o. Female) Treating RN: Roger Shelter Primary Care Froilan Mclean: Lavera [name] Other Clinician: Referring Natacha Jepsen: Referral, Self Treating Taheerah Guldin/Extender: Tito Dine in Treatment: 0 Primary Learner Assessed: Patient Learning Preferences/Education Level/Primary Language Learning Preference: Explanation Highest Education Level: College or Above Preferred Language: English Cognitive Barrier Assessment/Beliefs Language Barrier: No Translator Needed: No Memory Deficit: No Emotional Barrier: No Cultural/Religious Beliefs Affecting Medical Care: No Physical Barrier Assessment Impaired Vision: Yes Glasses Impaired Hearing: No Decreased Hand dexterity: No Knowledge/Comprehension Assessment Knowledge Level: High Comprehension Level: High Ability to understand written High instructions: Ability to understand verbal High instructions: Motivation Assessment Anxiety Level: Calm Cooperation: Cooperative Education Importance: Acknowledges Need Interest in Health Problems: Asks Questions Perception: Coherent Willingness to Engage in Self- High Management Activities: Readiness to Engage in Self- High Management Activities: Electronic Signature(s) Signed: 03/23/2017 11:58:24 AM By: Roger Shelter Entered By: Roger Shelter on 03/23/2017 09:39:30 Elks, [name] ([id number]) -------------------------------------------------------------------------------- Fall Risk Assessment  Details Patient Name: Cicalese, Syble A. Date of Service: 03/23/2017 9:45 AM Medical Record Number: [id number] Patient Account Number: [id number] Date of Birth/Sex: 1940-10-16 (77 y.o. Female) Treating RN: Roger Shelter Primary Care Nicholos Aloisi: Lavera [name] Other Clinician: Referring Correen Bubolz: Referral, Self Treating Lenford Beddow/Extender: Tito Dine in Treatment: 0 Fall Risk Assessment Items Have you had 2 or more falls in the last 12 monthso 0 No Have you had any fall that resulted in injury in the last 12 monthso 0 No FALL RISK ASSESSMENT: History of falling - immediate or within 3 months 0 No Secondary diagnosis 0 No Ambulatory aid None/bed rest/wheelchair/nurse 0 No Crutches/cane/walker 0 No Furniture  0 No IV Access/Saline Lock 0 No Gait/Training Normal/bed rest/immobile 0 No Weak 0 No Impaired 0 No Mental Status Oriented to own ability 0 No Electronic Signature(s) Signed: 03/23/2017 11:58:24 AM By: Roger Shelter Entered By: Roger Shelter on 03/23/2017 09:39:36 Creary, Windy A. ([id number]) -------------------------------------------------------------------------------- Foot Assessment Details Patient Name: Scavone, Donna A. Date of Service: 03/23/2017 9:45 AM Medical Record Number: [id number] Patient Account Number: [id number] Date of Birth/Sex: 1940/04/28 (77 y.o. Female) Treating RN: Roger Shelter Primary Care Gionni Vaca: Lavera [name] Other Clinician: Referring Norrin Shreffler: Referral, Self Treating Kortny Lirette/Extender: Ricard Dillon Weeks in Treatment: 0 Foot Assessment Items Site Locations + = Sensation present, - = Sensation absent, C = Callus, U = Ulcer R = Redness, W = Warmth, M = Maceration, PU = Pre-ulcerative lesion F = Fissure, S = Swelling, D = Dryness Assessment Right: Left: Other Deformity: No No Prior Foot Ulcer: No No Prior Amputation: No No Charcot Joint: No No Ambulatory Status: Ambulatory With Help Assistance Device: Walker Gait:  Steady Electronic Signature(s) Signed: 03/23/2017 11:58:24 AM By: Roger Shelter Entered By: Roger Shelter on 03/23/2017 09:43:30 Whelchel, Aashka A. ([id number]) -------------------------------------------------------------------------------- Nutrition Risk Assessment Details Patient Name: Latorre, Jazzlene A. Date of Service: 03/23/2017 9:45 AM Medical Record Number: [id number] Patient Account Number: [id number] Date of Birth/Sex: [birth year] (77 y.o. Female) Treating RN: Roger Shelter Primary Care Anahy Esh: Lavera [name] Other Clinician: Referring Elfrieda Espino: Referral, Self Treating Dario Yono/Extender: Ricard Dillon Weeks in Treatment: 0 Height (in): 62 Weight (lbs): 170.4 Body Mass Index (BMI): 31.2 Nutrition Risk Assessment Items NUTRITION RISK SCREEN: I have an illness or condition that made me change the kind and/or amount of 0 No food I eat I eat fewer than two meals per day 0 No I eat few fruits and vegetables, or milk products 0 No I have three or more drinks of beer, liquor or wine almost every day 0 No I have tooth or mouth problems that make it hard for me to eat 0 No I don't always have enough money to buy the food I need 0 No I eat alone most of the time 0 No I take three or more different prescribed or over-the-counter drugs a day 0 No Without wanting to, I have lost or gained 10 pounds in the last six months 0 No I am not always physically able to shop, cook and/or feed myself 0 No Nutrition Protocols Good Risk Protocol 0 No interventions needed Moderate Risk Protocol Electronic Signature(s) Signed: 03/23/2017 11:58:24 AM By: Roger Shelter Entered By: Roger Shelter on 03/23/2017 09:39:45

## 2017-03-25 NOTE — Progress Notes (Signed)
[name], [name] ([id number]) Visit Report for 03/23/2017 Chief Complaint Document Details Patient Name: [name], [name] Date of Service: 03/23/2017 9:45 AM Medical Record Number: [id number] Patient Account Number: [id number] Date of Birth/Sex: 01-27-1940 (77 y.o. Female) Treating RN: Cornell Barman Primary Care Provider: Lavera [name] Other Clinician: Referring Provider: Referral, Self Treating Provider/Extender: Tito Dine in Treatment: 0 Information Obtained from: Patient Chief Complaint 12/24/15 patient is here for review of to nonhealing surgical wounds in [name] CABG scar 03/23/17; patient is here for review of [name] wound on the left heel Electronic Signature(s) Signed: 03/23/2017 5:42:24 PM By: [name] MD Entered By: [name] on 03/23/2017 10:40:58 [name], [name] ([id number]) -------------------------------------------------------------------------------- Debridement Details Patient Name: [name], [name] Date of Service: 03/23/2017 9:45 AM Medical Record Number: [id number] Patient Account Number: [id number] Date of Birth/Sex: [birth year] (77 y.o. Female) Treating RN: Cornell Barman Primary Care Provider: Lavera [name] Other Clinician: Referring Provider: Referral, Self Treating Provider/Extender: Tito Dine in Treatment: 0 Debridement Performed for Wound #4 Left,Lateral Foot Assessment: Performed By: Physician Ricard Dillon, MD Debridement: Debridement Pre-procedure Verification/Time Yes - 10:10 Out Taken: Start Time: 10:10 Pain Control: Other : lidocaine 4% Level: Skin/Subcutaneous Tissue Total Area Debrided (L x W): 1.6 (cm) x 1 (cm) = 1.6 (cm) Tissue and other material Viable, Non-Viable, Fibrin/Slough, Skin, Subcutaneous debrided: Instrument: Curette, Scissors Bleeding: Moderate Hemostasis Achieved: Silver Nitrate End Time: 10:12 Procedural Pain: 0 Post Procedural Pain: 0 Response to Treatment: Procedure was tolerated well Post Debridement  Measurements of Total Wound Length: (cm) 1.6 Stage: Category/Stage III Width: (cm) 1 Depth: (cm) 0.2 Volume: (cm) 0.251 Character of Wound/Ulcer Post Improved Debridement: Post Procedure Diagnosis Same as Pre-procedure Electronic Signature(s) Signed: 03/23/2017 5:38:59 PM By: Gretta Cool, BSN, RN, CWS, Kim RN, BSN Signed: 03/23/2017 5:42:24 PM By: [name] MD Entered By: [name] on 03/23/2017 10:40:25 [name], [name] ([id number]) -------------------------------------------------------------------------------- HPI Details Patient Name: [name], [name] Date of Service: 03/23/2017 9:45 AM Medical Record Number: [id number] Patient Account Number: [id number] Date of Birth/Sex: 1940/06/07 (77 y.o. Female) Treating RN: Cornell Barman Primary Care Provider: Lavera [name] Other Clinician: Referring Provider: Referral, Self Treating Provider/Extender: Tito Dine in Treatment: 0 History of Present Illness HPI Description: 12/24/15; Mrs. Sparr is [name] 77 year old woman who is the mother of one of our other clinic patient's Tammy Shivley. He comes [place] today for review [name] nonhealing surgical CABG wound. The patient underwent CABG o2 on October 31 17 at [facility]. She apparently had had [name] myocardial infarction in October but briefly presented with shortness of breath. She underwent surgery which I understand was uneventful. According to the patient her surgical incision never really healed in 2 spots. One is at the inferior recess question drain site, the other more superiorly about an inch from the superior surface of this it incision. She has been followed by her surgeon at [facility] who felt that both areas which he'll according to the patient. She was put on Augmentin 2 weeks ago and she is finished [name] course of this culture at that time showed Escherichia coli which should've been sensitive. The patient also relates that she has [name] history of allergy to ampicillin. Besides this  the patient has [name] history of type 2 diabetes on metformin, multiple myeloma. She had [name] chest x-ray in mid October the did not show any abnormalities. I cannot see that she had any specific views of the sternum however. We have her referral note from her primary physician Dr.  Bernie Covey. She was concerned about erythema around the wound and as mentioned the culture here showed Escherichia coli which should have been Augmentin sensitive but was resistant to ampicillin, Keflex third generation cephalosporins quinolones and sulfonamides. [ESBL). The patient does not complain of fever or chills excessive pain. She does say that the lower surgical wound has been draining what looks to her like pus. She has been using triple antibiotic cream. 12/31/15 superior wound actually looks somewhat better. Inferiorly still [name] wound with some depth greater than 2 cm. Using Aquacel Ag. Culture I did of the drainage from the inferior wound and this surgical incision last week was negative 01/07/16; superior wound still requires debridement of surface eschar and nonviable subcutaneous tissue. Inferior wound measured at 1.8 cm. What I can see of the tissue here does not look very healthy. Original culture that I did on her presentation was negative however 01/27/16; the superior wound is closed she still has [name] deep probing wound of roughly 2 cm inferiorly. There is no overt infection no surrounding erythema. The patient tells me that 2 weeks ago she was developing chest pain and underwent [name] nuclear stress test. This was apparently negative however she developed chest pain shortly thereafter and had to go have [name] cardiac catheterization that did not show blockages. Since then she is not having chest pain but has unrelenting nausea making it difficult for her to eat. We reviewed her medications and she is on aspirin and Plavix however she apparently has [name] stent in place as well. 02/03/16; still using silver alginate.  Depth of this is down from 1.8 cm the circumference is smaller and the depth down by 2 mm red no drainage and no surrounding infection 02/10/16; still using silver alginate depth down to 1.3 cm today measured by myself. Appears to be making slow progress. There is no surrounding tenderness.. The patient states she has unrelenting nausea which is nonexertional. Not related to meals. She has had no vomiting. She also tells me she is had previous lap band surgery but does not give clear reflux type symptoms 02/25/16; the wound orifice of this lady's wound on her chest is down to 1 cm in depth and much smaller in terms of the overall diameter. She tells me that she was packing the wound recently and developed quite [name] bit of bleeding that took several hours to stop she is on Plavix and aspirin. She is not having any exertional symptoms that sound like coronary artery disease. She is complaining of unrelenting nausea but she is being evaluated by GI for this and apparently has an endoscopy planned for March. We have been using iodoform packing 03/03/16; small wound in terms of orifice but measures 1.2 cm in depth, not much different from last time. She still complains of nausea and has an upper GI series books she is not describing exertional chest pain, shortness of breath, nausea or fatigue. 03/16/16; I follow this patient for [name] nonhealing surgical wound at [name] site of [name] previous CABG scar. She developed cellulitis in the incision postoperatively. Today she arrives with the wound orifice to miniscule to even consider measuring [name] depth. She came in with [name] Band-Aid over this, she could not get any of the iodoform packing in the increasingly small orifice 03/31/16; the patient's nonhealing surgical wound at the site of her previous CABG scar/inferior aspect is totally epithelialized. She tells me there was some bleeding out of this which was minimal last week however with careful inspection  there is no evidence  of any open here everything looks healthy and epithelialize. Surrounding tissue also looks normal [name], [name] ([id number]) the patient arrives today with erythema and [name] significant blister mostly involving the dorsal aspect of her right fifth finger. There were 2 small almost looked like puncture wounds on the anterior aspect of the DIP joint and the medial aspect of the finger. The area was tender and warm. There was no involvement of any joint 04/20/16; The patient returns today for one last followup. Her original chest wound has remained closed which is fortunate, She came in last time with [name] right 5th finger blister and erythema. she did not tolerate the aquacel AG. C+S I did was negative. since I last saw her she has [name] skin tear on the right lateral elbow while doing [name] medical test READMISSION 03/23/17;Mrs. Rotolo is [name] now 77 year old woman that we cared for in this clinic about [name] year ago with nonhealing wounds at the site of her CABG. She has [name] type II diabetic with diabetic neuropathy. At the point of this dictation I don't have [name] hemoglobin A1c however she is only on oral agents. She tells me that she noted [name] blister on her left lateral heel about 3 weeks ago. She brought this to attention of her primary doctor and was given DuoDERM. Later on her daughter actually removed the surface of this and some surrounding skin. She is in for our review of this. She does not have [name] history of wounds on her lower extremities. She does not have known PAD. She does have neuropathy in her feet ABIs in this clinic were noncompressible bilaterally Electronic Signature(s) Signed: 03/23/2017 5:42:24 PM By: [name] MD Entered By: [name] on 03/23/2017 10:43:42 Coltrane, Icyss [name]. ([id number]) -------------------------------------------------------------------------------- Physical Exam Details Patient Name: Brittingham, Zoraya [name]. Date of Service: 03/23/2017 9:45 AM Medical Record Number:  [id number] Patient Account Number: [id number] Date of Birth/Sex: 05-26-1940 (77 y.o. Female) Treating RN: Cornell Barman Primary Care Provider: Lavera [name] Other Clinician: Referring Provider: Referral, Self Treating Provider/Extender: Ricard Dillon Weeks in Treatment: 0 Constitutional Sitting or standing Blood Pressure is within target range for patient.. Pulse regular and within target range for patient.Marland Kitchen Respirations regular, non-labored and within target range.. Temperature is normal and within the target range for the patient.Marland Kitchen appears in no distress. Respiratory Respiratory effort is easy and symmetric bilaterally. Rate is normal at rest and on room air.. Bilateral breath sounds are clear and equal in all lobes with no wheezes, rales or rhonchi.. Cardiovascular Heart rhythm and rate regular, without murmur or gallop.CABG scar noted. femoral pulses palpable. Pedal pulses palpable bilaterally. there is no edema. Gastrointestinal (GI) Abdomen is soft and non-distended without masses or tenderness. Bowel sounds active in all quadrants.. Lymphatic none palpable in the popliteal or inguinal area. Integumentary (Hair, Skin) there are no primary skin issues that I can see. Neurological loss of vibration and light touch in the plantar surface of the left foot. Psychiatric No evidence of depression, anxiety, or agitation. Calm, cooperative, and communicative. Appropriate interactions and affect.. Notes wound exam; the areas on the lateral part of the left heel. This is not [name] weight-bearing surface. It appears that most of this actually has satisfactory new epithelialization. However there is an "L" shaped area that is still open. Covered in tightly adherent necrotic debris which I removed with [name] #3 curet. Using scissors I also removed some nonviable skin from the inferior part of the wound which was probably  [name] remnant of the original blister Electronic Signature(s) Signed: 03/23/2017  5:42:24 PM By: [name] MD Entered By: [name] on 03/23/2017 10:48:00 [name], [name] ([id number]) -------------------------------------------------------------------------------- Physician Orders Details Patient Name: [name], [name] Date of Service: 03/23/2017 9:45 AM Medical Record Number: [id number] Patient Account Number: [id number] Date of Birth/Sex: Mar 25, 1940 (77 y.o. Female) Treating RN: Cornell Barman Primary Care Provider: Lavera [name] Other Clinician: Referring Provider: Referral, Self Treating Provider/Extender: Tito Dine in Treatment: 0 Verbal / Phone Orders: No Diagnosis Coding Wound Cleansing Wound #4 Left,Lateral Foot o Cleanse wound with mild soap and water o May Shower, gently pat wound dry prior to applying new dressing. Anesthetic (add to Medication List) Wound #4 Left,Lateral Foot o Topical Lidocaine 4% cream applied to wound bed prior to debridement (In Clinic Only). Primary Wound Dressing Wound #4 Left,Lateral Foot o Silvercel Non-Adherent Secondary Dressing Wound #4 Left,Lateral Foot o Boardered Foam Dressing Dressing Change Frequency Wound #4 Left,Lateral Foot o Three times weekly Follow-up Appointments Wound #4 Left,Lateral Foot o Return Appointment in 1 week. Off-Loading Wound #4 Left,Lateral Foot o Open toe surgical shoe to: - Left foot Patient Medications Allergies: Aricept, Benzodiazepines, Opioids - Morphine Analogues, penicillin, Sulfa (Sulfonamide Antibiotics), Statins-Hmg- Coa Reductase Inhibitors Notifications Medication Indication Start End lidocaine DOSE topical 4 % cream - cream topical Electronic Signature(s) Signed: 03/23/2017 5:38:59 PM By: Gretta Cool, BSN, RN, CWS, Kim RN, BSN [name], [name] AMarland Kitchen ([id number]) Signed: 03/23/2017 5:42:24 PM By: [name] MD Entered By: Gretta Cool, BSN, RN, CWS, Kim on 03/23/2017 10:15:26 [name], [name]  ([id number]) -------------------------------------------------------------------------------- Prescription 03/23/2017 Patient Name: Bellot, Rennie [name]. Provider: Ricard Dillon MD Date of Birth: 07-03-1940 NPI#: [id number] Sex: F DEA#: [id number] Phone #: [phone] License #: [id number] Patient Address: [facility] (MAILING ADDRESS) [address], [address] [place], [place] [address] [place], [place] [address] [phone] Allergies Aricept Benzodiazepines Opioids - Morphine Analogues penicillin Sulfa (Sulfonamide Antibiotics) Statins-Hmg-Coa Reductase Inhibitors Medication Medication: Route: Strength: Form: lidocaine topical 4% cream Class: TOPICAL LOCAL ANESTHETICS Dose: Frequency / Time: Indication: cream topical Number of Refills: Number of Units: 0 Generic Substitution: Start Date: End Date: Administered at [place]: Yes Time Administered: Time Discontinued: Note to Pharmacy: Signature(s): Date(s): [name], [name] ([id number]) Electronic Signature(s) Signed: 03/23/2017 5:38:59 PM By: Gretta Cool, BSN, RN, CWS, Kim RN, BSN Signed: 03/23/2017 5:42:24 PM By: [name] MD Entered By: Gretta Cool, BSN, RN, CWS, Kim on 03/23/2017 10:15:27 Mcgeachy, Tyhesha [name]. ([id number]) --------------------------------------------------------------------------------  Problem List Details Patient Name: Votta, Marivel [name]. Date of Service: 03/23/2017 9:45 AM Medical Record Number: [id number] Patient Account Number: [id number] Date of Birth/Sex: 20-Jan-1940 (77 y.o. Female) Treating RN: Cornell Barman Primary Care Provider: Lavera [name] Other Clinician: Referring Provider: Referral, Self Treating Provider/Extender: Ricard Dillon Weeks in Treatment: 0 Active Problems ICD-10 Encounter Code Description Active Date Diagnosis E11.621 Type 2 diabetes mellitus with foot ulcer 03/23/2017 Yes L97.521  Non-pressure chronic ulcer of other part of left foot limited to 03/23/2017 Yes breakdown of skin E11.42 Type 2 diabetes mellitus with diabetic polyneuropathy 03/23/2017 Yes Inactive Problems Resolved Problems Electronic Signature(s) Signed: 03/23/2017 5:42:24 PM By: [name] MD Entered By: [name] on 03/23/2017 10:38:48 Millican, Manie [name]. ([id number]) -------------------------------------------------------------------------------- Progress Note Details Patient Name: Bockrath, Lexey [name]. Date of Service: 03/23/2017 9:45 AM Medical Record Number: [id number] Patient Account Number: [id number] Date of Birth/Sex: 11-Nov-1940 (77 y.o. Female) Treating RN: Cornell Barman Primary Care Provider: Lavera [name] Other Clinician: Referring Provider: Referral,  Self Treating Provider/Extender: Ricard Dillon Weeks in Treatment: 0 Subjective Chief Complaint Information obtained from Patient 12/24/15 patient is here for review of to nonhealing surgical wounds in [name] CABG scar 03/23/17; patient is here for review of [name] wound on the left heel History of Present Illness (HPI) 12/24/15; Mrs. Kirstein is [name] 77 year old woman who is the mother of one of our other clinic patient's Tammy Volcy. He comes [place] today for review [name] nonhealing surgical CABG wound. The patient underwent CABG o2 on October 31 17 at [facility]. She apparently had had [name] myocardial infarction in October but briefly presented with shortness of breath. She underwent surgery which I understand was uneventful. According to the patient her surgical incision never really healed in 2 spots. One is at the inferior recess question drain site, the other more superiorly about an inch from the superior surface of this it incision. She has been followed by her surgeon at [facility] who felt that both areas which he'll according to the patient. She was put on Augmentin 2 weeks ago and she is finished [name] course of this culture at that time showed  Escherichia coli which should've been sensitive. The patient also relates that she has [name] history of allergy to ampicillin. Besides this the patient has [name] history of type 2 diabetes on metformin, multiple myeloma. She had [name] chest x-ray in mid October the did not show any abnormalities. I cannot see that she had any specific views of the sternum however. We have her referral note from her primary physician Dr. Bernie Covey. She was concerned about erythema around the wound and as mentioned the culture here showed Escherichia coli which should have been Augmentin sensitive but was resistant to ampicillin, Keflex third generation cephalosporins quinolones and sulfonamides. [ESBL). The patient does not complain of fever or chills excessive pain. She does say that the lower surgical wound has been draining what looks to her like pus. She has been using triple antibiotic cream. 12/31/15 superior wound actually looks somewhat better. Inferiorly still [name] wound with some depth greater than 2 cm. Using Aquacel Ag. Culture I did of the drainage from the inferior wound and this surgical incision last week was negative 01/07/16; superior wound still requires debridement of surface eschar and nonviable subcutaneous tissue. Inferior wound measured at 1.8 cm. What I can see of the tissue here does not look very healthy. Original culture that I did on her presentation was negative however 01/27/16; the superior wound is closed she still has [name] deep probing wound of roughly 2 cm inferiorly. There is no overt infection no surrounding erythema. The patient tells me that 2 weeks ago she was developing chest pain and underwent [name] nuclear stress test. This was apparently negative however she developed chest pain shortly thereafter and had to go have [name] cardiac catheterization that did not show blockages. Since then she is not having chest pain but has unrelenting nausea making it difficult for her to eat. We reviewed her  medications and she is on aspirin and Plavix however she apparently has [name] stent in place as well. 02/03/16; still using silver alginate. Depth of this is down from 1.8 cm the circumference is smaller and the depth down by 2 mm red no drainage and no surrounding infection 02/10/16; still using silver alginate depth down to 1.3 cm today measured by myself. Appears to be making slow progress. There is no surrounding tenderness.. The patient states she has unrelenting nausea which is nonexertional. Not  related to meals. She has had no vomiting. She also tells me she is had previous lap band surgery but does not give clear reflux type symptoms 02/25/16; the wound orifice of this lady's wound on her chest is down to 1 cm in depth and much smaller in terms of the overall diameter. She tells me that she was packing the wound recently and developed quite [name] bit of bleeding that took several hours to stop she is on Plavix and aspirin. She is not having any exertional symptoms that sound like coronary artery disease. She is complaining of unrelenting nausea but she is being evaluated by GI for this and apparently has an endoscopy planned for March. We have been using iodoform packing 03/03/16; small wound in terms of orifice but measures 1.2 cm in depth, not much different from last time. She still complains of Fasnacht, Haydn [name]. ([id number]) nausea and has an upper GI series books she is not describing exertional chest pain, shortness of breath, nausea or fatigue. 03/16/16; I follow this patient for [name] nonhealing surgical wound at [name] site of [name] previous CABG scar. She developed cellulitis in the incision postoperatively. Today she arrives with the wound orifice to miniscule to even consider measuring [name] depth. She came in with [name] Band-Aid over this, she could not get any of the iodoform packing in the increasingly small orifice 03/31/16; the patient's nonhealing surgical wound at the site of her previous CABG scar/inferior  aspect is totally epithelialized. She tells me there was some bleeding out of this which was minimal last week however with careful inspection there is no evidence of any open here everything looks healthy and epithelialize. Surrounding tissue also looks normal the patient arrives today with erythema and [name] significant blister mostly involving the dorsal aspect of her right fifth finger. There were 2 small almost looked like puncture wounds on the anterior aspect of the DIP joint and the medial aspect of the finger. The area was tender and warm. There was no involvement of any joint 04/20/16; The patient returns today for one last followup. Her original chest wound has remained closed which is fortunate, She came in last time with [name] right 5th finger blister and erythema. she did not tolerate the aquacel AG. C+S I did was negative. since I last saw her she has [name] skin tear on the right lateral elbow while doing [name] medical test READMISSION 03/23/17;Mrs. Tidd is [name] now 77 year old woman that we cared for in this clinic about [name] year ago with nonhealing wounds at the site of her CABG. She has [name] type II diabetic with diabetic neuropathy. At the point of this dictation I don't have [name] hemoglobin A1c however she is only on oral agents. She tells me that she noted [name] blister on her left lateral heel about 3 weeks ago. She brought this to attention of her primary doctor and was given DuoDERM. Later on her daughter actually removed the surface of this and some surrounding skin. She is in for our review of this. She does not have [name] history of wounds on her lower extremities. She does not have known PAD. She does have neuropathy in her feet ABIs in this clinic were noncompressible bilaterally Wound History Patient presents with 1 open wound that has been present for approximately 3 weeks. Patient has been treating wound in the following manner: derm cream. Laboratory tests have been performed in the last month.  Patient reportedly has not tested positive for an antibiotic resistant organism.  Patient reportedly has not tested positive for osteomyelitis. Patient reportedly has not had testing performed to evaluate circulation in the legs. Patient History Information obtained from Patient. Allergies Aricept, Benzodiazepines, Opioids - Morphine Analogues, penicillin, Sulfa (Sulfonamide Antibiotics), Statins-Hmg-Coa Reductase Inhibitors Family History Cancer - Father, Diabetes - Mother, Heart Disease - Mother, Hypertension - Mother, No family history of Hereditary Spherocytosis, Kidney Disease, Lung Disease, Seizures, Stroke, Thyroid Problems, Tuberculosis. Social History Never smoker, Marital Status - Divorced, Alcohol Use - Never, Drug Use - No History, Caffeine Use - Moderate. Medical History Cardiovascular Denies history of Arrhythmia Endocrine Patient has history of Type II Diabetes Immunological Denies history of Lupus Erythematosus, Raynaud s, Scleroderma Neurologic Denies history of Dementia, Quadriplegia, Paraplegia, Seizure Disorder Oncologic Patient has history of Received Chemotherapy - last 2016, Received Radiation - 2 years ago Malinak, Ayauna [name]. ([id number]) Patient is treated with Oral Agents. Blood sugar is tested. Blood sugar results noted at the following times: Breakfast - 121. 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 Cardiovascular Denies complaints or symptoms of Chest pain, LE edema. Genitourinary The patient has no complaints or symptoms. Immunological Denies complaints or symptoms of Hives, Itching. Integumentary (Skin) Complains or has symptoms of Wounds, Bleeding or bruising tendency, Swelling. Denies complaints or symptoms of Breakdown. Musculoskeletal Complains or has symptoms of Muscle Pain, Muscle  Weakness. Neurologic Complains or has symptoms of Numbness/parasthesias - feet numbness at times. Denies complaints or symptoms of Focal/Weakness. Oncologic multiple myelomoa Objective Constitutional Sitting or standing Blood Pressure is within target range for patient.. Pulse regular and within target range for patient.Marland Kitchen Respirations regular, non-labored and within target range.. Temperature is normal and within the target range for the patient.Marland Kitchen appears in no distress. Vitals Time Taken: 9:32 AM, Height: 62 in, Source: Stated, Weight: 170.4 lbs, Source: Measured, BMI: 31.2, Temperature: 97.6 F, Pulse: 64 bpm, Respiratory Rate: 18 breaths/min, Blood Pressure: 118/42 mmHg. Respiratory Respiratory effort is easy and symmetric bilaterally. Rate is normal at rest and on room air.. Bilateral breath sounds are clear and equal in all lobes with no wheezes, rales or rhonchi.. Cardiovascular Heart rhythm and rate regular, without murmur or gallop.CABG scar noted. femoral pulses palpable. Pedal pulses palpable bilaterally. there is no edema. Gastrointestinal (GI) Abdomen is soft and non-distended without masses or tenderness. Bowel sounds active in all quadrants.. Lymphatic none palpable in the popliteal or inguinal area. Conklin, Kinslei [name]. ([id number]) Neurological loss of vibration and light touch in the plantar surface of the left foot. Psychiatric No evidence of depression, anxiety, or agitation. Calm, cooperative, and communicative. Appropriate interactions and affect.. General Notes: wound exam; the areas on the lateral part of the left heel. This is not [name] weight-bearing surface. It appears that most of this actually has satisfactory new epithelialization. However there is an "L" shaped area that is still open. Covered in tightly adherent necrotic debris which I removed with [name] #3 curet. Using scissors I also removed some nonviable skin from the inferior part of the wound which was probably [name]  remnant of the original blister Integumentary (Hair, Skin) there are no primary skin issues that I can see. Wound #4 status is Open. Original cause of wound was Pressure Injury. The wound is located on the Left,Lateral Foot. The wound measures 1.6cm length x 1cm width x 0.1cm depth; 1.257cm^2 area and 0.126cm^3 volume. There is Fat Layer (Subcutaneous Tissue) Exposed exposed. There is no tunneling or  undermining noted. There is [name] medium amount of serous drainage noted. The wound margin is flat and intact. There is medium (34-66%) red granulation within the wound bed. There is [name] medium (34-66%) amount of necrotic tissue within the wound bed including Eschar and Adherent Slough. The periwound skin appearance exhibited: Callus, Induration. The periwound skin appearance did not exhibit: Crepitus, Excoriation, Rash, Scarring, Dry/Scaly, Maceration, Atrophie Blanche, Cyanosis, Ecchymosis, Hemosiderin Staining, Mottled, Pallor, Rubor, Erythema. Periwound temperature was noted as No Abnormality. The periwound has tenderness on palpation. 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 There was [name] Skin/Subcutaneous Tissue Debridement ([id number]) debridement with total area of 1.6 sq cm performed by Ricard Dillon, MD. with the following instrument(s): Curette and Scissors to remove Viable and Non-Viable tissue/material including Fibrin/Slough, Skin, and Subcutaneous after achieving pain control using Other (lidocaine 4%). [name] time out was conducted at 10:10, prior to the start of the procedure. [name] Moderate amount of bleeding was controlled with Silver Nitrate. The procedure was tolerated well with [name]  pain level of 0 throughout and [name] pain level of 0 following the procedure. Post Debridement Measurements: 1.6cm length x 1cm width x 0.2cm depth; 0.251cm^3 volume. Post debridement Stage noted as Category/Stage III. Character of Wound/Ulcer Post Debridement is improved. Post procedure Diagnosis Wound #4: Same as Pre-Procedure Serena, Lashia [name]. ([id number]) Plan Wound Cleansing: Wound #4 Left,Lateral Foot: Cleanse wound with mild soap and water May Shower, gently pat wound dry prior to applying new dressing. Anesthetic (add to Medication List): Wound #4 Left,Lateral Foot: Topical Lidocaine 4% cream applied to wound bed prior to debridement (In Clinic Only). Primary Wound Dressing: Wound #4 Left,Lateral Foot: Silvercel Non-Adherent Secondary Dressing: Wound #4 Left,Lateral Foot: Boardered Foam Dressing Dressing Change Frequency: Wound #4 Left,Lateral Foot: Three times weekly Follow-up Appointments: Wound #4 Left,Lateral Foot: Return Appointment in 1 week. Off-Loading: Wound #4 Left,Lateral Foot: Open toe surgical shoe to: - Left foot The following medication(s) was prescribed: lidocaine topical 4 % cream cream topical was prescribed at facility #1 we are going to use silver alginate and border foam this can be changed every second day #2 thing most of this is already epithelialized although the area is still somewhat vulnerable. #3 the patient does not have significant PAD based on clinical exam. Her ABIs in this clinic were however noncompressible. #4 she does have diabetic neuropathy and we put her in [name] healing sandal to offload this area Electronic Signature(s) Signed: 03/23/2017 5:42:24 PM By: [name] MD Entered By: [name] on 03/23/2017 10:49:08 Martine, Avari [name]. ([id number]) -------------------------------------------------------------------------------- ROS/PFSH Details Patient Name: Delk, Ralph [name]. Date of Service: 03/23/2017 9:45 AM Medical Record Number:  [id number] Patient Account Number: [id number] Date of Birth/Sex: April 03, 1940 (77 y.o. Female) Treating RN: Roger Shelter Primary Care Provider: Lavera [name] Other Clinician: Referring Provider: Referral, Self Treating Provider/Extender: Tito Dine in Treatment: 0 Information Obtained From Patient Wound History Do you currently have one or more open woundso Yes How many open wounds do you currently haveo 1 Approximately how long have you had your woundso 3 weeks How have you been treating your wound(s) until nowo derm cream Has your wound(s) ever healed and then re-openedo No Have you had any lab work  done in the past montho Yes Have you tested positive for an antibiotic resistant organism (MRSA, VRE)o No Have you tested positive for osteomyelitis (bone infection)o No Have you had any tests for circulation on your legso No Cardiovascular Complaints and Symptoms: Negative for: Chest pain; LE edema Medical History: Positive for: Congestive Heart Failure; Coronary Artery Disease; Hypertension Negative for: Arrhythmia Immunological Complaints and Symptoms: Negative for: Hives; Itching Medical History: Negative for: Lupus Erythematosus; Raynaudos; Scleroderma Integumentary (Skin) Complaints and Symptoms: Positive for: Wounds; Bleeding or bruising tendency; Swelling Negative for: Breakdown Musculoskeletal Complaints and Symptoms: Positive for: Muscle Pain; Muscle Weakness Medical History: Positive for: Osteoarthritis Neurologic Complaints and Symptoms: Positive for: Numbness/parasthesias - feet numbness at times Negative for: Focal/Weakness Teaney, Maxx [name]. ([id number]) Medical History: Positive for: Neuropathy Negative for: Dementia; Quadriplegia; Paraplegia; Seizure Disorder Eyes Medical History: Positive for: Cataracts - removed Hematologic/Lymphatic Complaints and Symptoms: No Complaints or Symptoms Medical History: Positive for: Anemia Endocrine Medical  History: Positive for: Type II Diabetes Time with diabetes: at least 20 years Treated with: Oral agents Blood sugar tested every day: Yes Tested : QD Blood sugar testing results: Breakfast: 121 Genitourinary Complaints and Symptoms: No Complaints or Symptoms Oncologic Complaints and Symptoms: Review of System Notes: multiple myelomoa Medical History: Positive for: Received Chemotherapy - last 2016; Received Radiation - 2 years ago Past Medical History Notes: multiple myeloma - no treatment currently HBO Extended History Items Eyes: Cataracts Immunizations Pneumococcal Vaccine: Received Pneumococcal Vaccination: No Tetanus Vaccine: Last tetanus shot: 03/12/2015 Immunization Notes: up to date Implantable Devices Family and Social History Quimby, ADELEIGH BARLETTA ([id number]) Cancer: Yes - Father; Diabetes: Yes - Mother; Heart Disease: Yes - Mother; Hereditary Spherocytosis: No; Hypertension: Yes - Mother; Kidney Disease: No; Lung Disease: No; Seizures: No; Stroke: No; Thyroid Problems: No; Tuberculosis: No; Never smoker; Marital Status - Divorced; Alcohol Use: Never; Drug Use: No History; Caffeine Use: Moderate; Financial Concerns: No; Food, Clothing or Shelter Needs: No; Support System Lacking: No; Transportation Concerns: No; Advanced Directives: Yes (Not Provided); Patient does not want information on Advanced Directives; Living Will: Yes (Not Provided); Medical Power of Attorney: Yes - Deam Stevick - son (Not Provided) Electronic Signature(s) Signed: 03/23/2017 11:58:24 AM By: Roger Shelter Signed: 03/23/2017 5:42:24 PM By: [name] MD Entered By: Roger Shelter on 03/23/2017 09:38:19 Allmendinger, Letizia [name]. ([id number]) -------------------------------------------------------------------------------- SuperBill Details Patient Name: Wester, Devlin [name]. Date of Service: 03/23/2017 Medical Record Number: [id number] Patient Account Number: [id number] Date of Birth/Sex: [birth year] (77 y.o.  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 Facility Procedures CPT4 Code Description: [id number] - WOUND CARE VISIT-LEV 3 EST PT Modifier: Quantity: 1 CPT4 Code Description: [id number] 11042 - DEB SUBQ TISSUE 20 SQ CM/< ICD-10 Diagnosis Description L97.521 Non-pressure chronic ulcer of other part of left foot limited to Modifier: breakdown of s Quantity: 1 kin Physician Procedures CPT4 Code Description: [id number] - WC PHYS LEVEL 4 - EST PT ICD-10 Diagnosis Description E11.621 Type 2 diabetes mellitus with foot ulcer L97.521 Non-pressure chronic ulcer of other part of left foot limited t E11.42 Type 2 diabetes mellitus with  diabetic polyneuropathy Modifier: 25 o breakdown of sk Quantity: 1 in CPT4 Code Description: [id number] 71219 -  WC PHYS SUBQ TISS 20 SQ CM ICD-10 Diagnosis Description L97.521 Non-pressure chronic ulcer of other part of left foot limited t Modifier: o breakdown of sk Quantity: 1 in Electronic Signature(s) Signed: 03/23/2017 5:42:24 PM By: [name] MD Entered By: [name] on 03/23/2017 10:49:47

## 2017-03-25 NOTE — Progress Notes (Signed)
LINSAY, VOGT ([id number]) Visit Report for 03/23/2017 Allergy List Details Patient Name: [name], [name] Date of Service: 03/23/2017 9:45 AM Medical Record Number: [id number] Patient Account Number: [id number] Date of Birth/Sex: [birth year] (77 y.o. Female) Treating RN: Roger Shelter Primary Care Denver Bentson: Lavera [name] Other Clinician: Referring Junette Bernat: Referral, Self Treating Wayne Brunker/Extender: Ricard Dillon Weeks in Treatment: 0 Allergies Active Allergies Aricept Benzodiazepines Opioids - Morphine Analogues penicillin Sulfa (Sulfonamide Antibiotics) Statins-Hmg-Coa Reductase Inhibitors Allergy Notes Electronic Signature(s) Signed: 03/23/2017 11:58:24 AM By: Roger Shelter Entered By: Roger Shelter on 03/23/2017 09:33:35 Holt, Shera A. ([id number]) -------------------------------------------------------------------------------- Arrival Information Details Patient Name: Humber, Baili A. Date of Service: 03/23/2017 9:45 AM Medical Record Number: [id number] Patient Account Number: [id number] Date of Birth/Sex: Mar 29, 1940 (77 y.o. Female) Treating RN: Roger Shelter Primary Care Kemara Quigley: Lavera [name] Other Clinician: Referring Jamayia Croker: Referral, Self Treating Jobie Popp/Extender: Tito Dine in Treatment: 0 Visit Information Patient Arrived: Walker Arrival Time: 09:29 Accompanied By: self Transfer Assistance: None Patient Identification Verified: Yes Secondary Verification Process Completed: Yes History Since Last Visit All ordered tests and consults were completed: No Added or deleted any medications: No Any new allergies or adverse reactions: No Had a fall or experienced change in activities of daily living that may affect risk of falls: No Signs or symptoms of abuse/neglect since last visito No Hospitalized since last visit: No Electronic Signature(s) Signed: 03/23/2017 11:58:24 AM By: Roger Shelter Entered By: Roger Shelter on 03/23/2017  09:30:12 Ashraf, Mariselda A. ([id number]) -------------------------------------------------------------------------------- Clinic Level of Care Assessment Details Patient Name: Javed, Kiowa A. Date of Service: 03/23/2017 9:45 AM Medical Record Number: [id number] Patient Account Number: [id number] Date of Birth/Sex: 1941/01/01 (77 y.o. Female) Treating RN: Cornell Barman Primary Care Zionah Criswell: Lavera [name] Other Clinician: Referring Taris Galindo: Referral, Self Treating Adrionna Delcid/Extender: Tito Dine in Treatment: 0 Clinic Level of Care Assessment Items TOOL 1 Quantity Score []  - Use when EandM and Procedure is performed on INITIAL visit 0 ASSESSMENTS - Nursing Assessment / Reassessment X - General Physical Exam (combine w/ comprehensive assessment (listed just below) when 1 20 performed on new pt. evals) X- 1 25 Comprehensive Assessment (HX, ROS, Risk Assessments, Wounds Hx, etc.) ASSESSMENTS - Wound and Skin Assessment / Reassessment []  - Dermatologic / Skin Assessment (not related to wound area) 0 ASSESSMENTS - Ostomy and/or Continence Assessment and Care []  - Incontinence Assessment and Management 0 []  - 0 Ostomy Care Assessment and Management (repouching, etc.) PROCESS - Coordination of Care X - Simple Patient / Family Education for ongoing care 1 15 []  - 0 Complex (extensive) Patient / Family Education for ongoing care X- 1 10 Staff obtains Programmer, systems, Records, Test Results / Process Orders []  - 0 Staff telephones HHA, Nursing Homes / Clarify orders / etc []  - 0 Routine Transfer to another Facility (non-emergent condition) []  - 0 Routine Hospital Admission (non-emergent condition) X- 1 15 New Admissions / Biomedical engineer / Ordering NPWT, Apligraf, etc. []  - 0 Emergency Hospital Admission (emergent condition) PROCESS - Special Needs []  - Pediatric / Minor Patient Management 0 []  - 0 Isolation Patient Management []  - 0 Hearing / Language / Visual special needs []   - 0 Assessment of Community assistance (transportation, D/C planning, etc.) []  - 0 Additional assistance / Altered mentation []  - 0 Support Surface(s) Assessment (bed, cushion, seat, etc.) Perrier, Blondie A. ([id number]) INTERVENTIONS - Miscellaneous []  - External ear exam 0 []  - 0 Patient Transfer (multiple staff / Civil Service fast streamer / Similar devices) []  - 0 Simple Staple /  Suture removal (25 or less) []  - 0 Complex Staple / Suture removal (26 or more) []  - 0 Hypo/Hyperglycemic Management (do not check if billed separately) X- 1 15 Ankle / Brachial Index (ABI) - do not check if billed separately Has the patient been seen at the hospital within the last three years: Yes Total Score: 100 Level Of Care: New/Established - Level 3 Electronic Signature(s) Signed: 03/23/2017 5:38:59 PM By: Gretta Cool, BSN, RN, CWS, Kim RN, BSN Entered By: Gretta Cool, BSN, RN, CWS, Kim on 03/23/2017 10:18:22 Yordy, [name] ([id number]) -------------------------------------------------------------------------------- Encounter Discharge Information Details Patient Name: Mackert, Jean A. Date of Service: 03/23/2017 9:45 AM Medical Record Number: [id number] Patient Account Number: [id number] Date of Birth/Sex: [birth year] (77 y.o. Female) Treating RN: Cornell Barman Primary Care Quinnton Bury: Lavera [name] Other Clinician: Referring Siren Porrata: Referral, Self Treating Reizel Calzada/Extender: Tito Dine in Treatment: 0 Encounter Discharge Information Items Discharge Pain Level: 0 Discharge Condition: Stable Ambulatory Status: Walker Discharge Destination: Home Transportation: Private Auto Accompanied By: self Schedule Follow-up Appointment: Yes Medication Reconciliation completed and No provided to Patient/Care Traves Majchrzak: Provided on Clinical Summary of Care: 03/23/2017 Form Type Recipient Paper Patient AW Electronic Signature(s) Signed: 03/23/2017 10:29:29 AM By: Roger Shelter Entered By: Roger Shelter on 03/23/2017  10:29:29 Littrell, Katoria A. ([id number]) -------------------------------------------------------------------------------- Lower Extremity Assessment Details Patient Name: Lomeli, Payton A. Date of Service: 03/23/2017 9:45 AM Medical Record Number: [id number] Patient Account Number: [id number] Date of Birth/Sex: Apr 23, 1940 (77 y.o. Female) Treating RN: Roger Shelter Primary Care Abryana Lykens: Lavera [name] Other Clinician: Referring Greta Yung: Referral, Self Treating Gaynelle Pastrana/Extender: Ricard Dillon Weeks in Treatment: 0 Edema Assessment Assessed: [Left: No] [Right: No] Edema: [Left: Ye] [Right: s] Calf Left: Right: Point of Measurement: 35 cm From Medial Instep 39.5 cm cm Ankle Left: Right: Point of Measurement: 9 cm From Medial Instep 21 cm cm Vascular Assessment Claudication: Claudication Assessment [Left:None] Pulses: Dorsalis Pedis Palpable: [Left:Yes] Posterior Tibial Extremity colors, hair growth, and conditions: Extremity Color: [Left:Normal] Hair Growth on Extremity: [Left:Yes] Temperature of Extremity: [Left:Warm] Capillary Refill: [Left:< 3 seconds] Toe Nail Assessment Left: Right: Thick: No Discolored: No Deformed: No Improper Length and Hygiene: No Notes non compressible in left leg Electronic Signature(s) Signed: 03/23/2017 11:58:24 AM By: Roger Shelter Entered By: Roger Shelter on 03/23/2017 09:55:06 Patmon, Porschia A. ([id number]) -------------------------------------------------------------------------------- Multi Wound Chart Details Patient Name: Garske, Danyla A. Date of Service: 03/23/2017 9:45 AM Medical Record Number: [id number] Patient Account Number: [id number] Date of Birth/Sex: 1940/08/21 (77 y.o. 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] [N/A:N/A] Comorbid History: [4:Cataracts, Anemia, Congestive Heart Failure, Coronary Artery Disease, Hypertension, Type II Diabetes, Osteoarthritis, Neuropathy, Received Chemotherapy, Received Radiation] [N/A:N/A] Date Acquired: [4:03/07/2017] [N/A:N/A] Weeks of Treatment: [4:0] [N/A:N/A] Wound Status: [4:Open] [N/A:N/A] Measurements L x W x D [4:1.6x1x0.1] [N/A:N/A] (cm) Area (cm) : [4:1.257] [N/A:N/A] Volume (cm) : [4:0.126] [N/A:N/A] Classification: [4:Category/Stage III] [N/A:N/A] Exudate Amount: [4:Medium] [N/A:N/A] Exudate Type: [4:Serous] [N/A:N/A] Exudate Color: [4:amber] [N/A:N/A] Wound Margin: [4:Flat and Intact] [N/A:N/A] Granulation Amount: [4:Medium (34-66%)] [N/A:N/A] Granulation Quality: [4:Red] [N/A:N/A] Necrotic Amount: [4:Medium (34-66%)] [N/A:N/A] Necrotic Tissue: [4:Eschar, Adherent Slough] [N/A:N/A] Exposed Structures: [4:Fat Layer (Subcutaneous Tissue) Exposed: Yes Fascia: No Tendon: No Muscle: No Joint:  No Bone: No] [N/A:N/A] Epithelialization: [4:Small (1-33%)] [N/A:N/A] Debridement: [4:Debridement (11042-11047)] [N/A:N/A] Pre-procedure 10:10 N/A N/A Verification/Time Out Taken: Pain Control: Other N/A N/A Tissue Debrided: Fibrin/Slough, Skin, N/A N/A Subcutaneous Level: Skin/Subcutaneous Tissue N/A N/A Debridement Area (sq cm): 1.6 N/A N/A Instrument: Curette, Scissors N/A N/A Bleeding: Moderate N/A N/A Hemostasis Achieved: Silver Nitrate N/A N/A Procedural Pain: 0 N/A N/A Post Procedural Pain: 0 N/A N/A Debridement Treatment Procedure was tolerated well N/A N/A Response: Post Debridement 1.6x1x0.2 N/A N/A Measurements L x W x D (cm) Post Debridement Volume:  0.251 N/A N/A (cm) Post Debridement Stage: Category/Stage III N/A N/A Periwound Skin Texture: Induration: Yes N/A N/A Callus: Yes Excoriation: No Crepitus: No Rash: No Scarring: No Periwound Skin Moisture: Maceration: No N/A N/A Dry/Scaly: No Periwound Skin Color: Atrophie Blanche: No N/A N/A Cyanosis: No Ecchymosis: No Erythema: No Hemosiderin Staining: No Mottled: No Pallor: No Rubor: No Temperature: No Abnormality N/A N/A Tenderness on Palpation: Yes N/A N/A Wound Preparation: Ulcer Cleansing: N/A N/A Rinsed/Irrigated with Saline Topical Anesthetic Applied: Other: lidocaine 4% Procedures Performed: Debridement N/A N/A Treatment Notes Wound #4 (Left, Lateral Foot) 1. Cleansed with: Clean wound with Normal Saline 2. Anesthetic Topical Lidocaine 4% cream to wound bed prior to debridement 4. Dressing Applied: Other dressing (specify in notes) 5. Secondary Dressing Applied Bordered Foam Dressing 6. Footwear/Offloading device applied Gambale, Correne A. ([id number]) Surgical shoe Notes silver nitrate to wound in clinic, silvercell to wound, boarder foam dressing with darko off loading shoe Electronic Signature(s) Signed: 03/23/2017 5:42:24 PM By: [name] MD Entered By: [name] on 03/23/2017 10:39:07 Orzechowski, [name] ([id number]) -------------------------------------------------------------------------------- [place] Details Patient Name: Whilden, Luree A. Date of Service: 03/23/2017 9:45 AM Medical Record Number: [id number] Patient Account Number: [id number] Date of Birth/Sex: [birth year] (77 y.o. Female) Treating RN: Cornell Barman Primary Care Alexsus Papadopoulos: Lavera [name] Other Clinician: Referring Consuela Widener: Referral, Self Treating Flor Whitacre/Extender: Tito Dine in Treatment: 0 Active Inactive ` Orientation to the Wound Care Program Nursing Diagnoses: Knowledge deficit related to the wound healing center  program Goals: Patient/caregiver will verbalize understanding of the [place] Program Date Initiated: 03/23/2017 Target Resolution Date: 04/15/2017 Goal Status: Active Interventions: Provide education on orientation to the wound center Notes: ` Pressure Nursing Diagnoses: Potential for impaired tissue integrity related to pressure, friction, moisture, and shear Goals: Patient will remain free of pressure ulcers Date Initiated: 03/23/2017 Target Resolution Date: 04/22/2017 Goal Status: Active Interventions: Assess: immobility, friction, shearing, incontinence upon admission and as needed Assess offloading mechanisms upon admission and as needed Notes: ` Wound/Skin Impairment Nursing Diagnoses: Knowledge deficit related to ulceration/compromised skin integrity Goals: Ulcer/skin breakdown will have a volume reduction of 30% by week 4 Date Initiated: 03/23/2017 Target Resolution Date: 04/23/2017 Goal Status: Active Bussie, Graylee A. ([id number]) Interventions: Assess ulceration(s) every visit Treatment Activities: Topical wound management initiated : 03/23/2017 Notes: Electronic Signature(s) Signed: 03/23/2017 5:38:59 PM By: Gretta Cool, BSN, RN, CWS, Kim RN, BSN Entered By: Gretta Cool, BSN, RN, CWS, Kim on 03/23/2017 10:10:27 Bensen, Ginamarie A. ([id number]) -------------------------------------------------------------------------------- Pain Assessment Details Patient Name: Levinson, Samarah A. Date of Service: 03/23/2017 9:45 AM Medical Record Number: [id number] Patient Account Number: [id number] Date of Birth/Sex: [birth year] (77 y.o. Female) Treating RN: Roger Shelter Primary Care Shamarie Call: Lavera [name] Other Clinician: Referring Ludie Pavlik: Referral, Self Treating Efe Fazzino/Extender: Ricard Dillon Weeks in Treatment: 0 Active Problems Location of Pain Severity and Description of Pain Patient Has Paino No Site Locations Pain Management and Medication Current Pain  Management: Electronic Signature(s) Signed: 03/23/2017  11:58:24 AM By: Roger Shelter Entered By: Roger Shelter on 03/23/2017 09:31:03 Murthy, [name] ([id number]) -------------------------------------------------------------------------------- Patient/Caregiver Education Details Patient Name: Malizia, Cordell A. Date of Service: 03/23/2017 9:45 AM Medical Record Number: [id number] Patient Account Number: [id number] Date of Birth/Gender: [birth year] (77 y.o. Female) Treating RN: Roger Shelter Primary Care Physician: Lavera [name] Other Clinician: Referring Physician: Referral, Self Treating Physician/Extender: Tito Dine in Treatment: 0 Education Assessment Education Provided To: Patient Education Topics Provided Welcome [facility]: Handouts: [facility] Methods: Explain/Verbal Responses: State content correctly Wound Debridement: Handouts: Wound Debridement Methods: Explain/Verbal Responses: State content correctly Wound/Skin Impairment: Handouts: Caring for Your Ulcer Methods: Explain/Verbal Responses: State content correctly Electronic Signature(s) Signed: 03/23/2017 11:58:24 AM By: Roger Shelter Entered By: Roger Shelter on 03/23/2017 10:29:49 Heckart, Tamira A. ([id number]) -------------------------------------------------------------------------------- Wound Assessment Details Patient Name: Hey, Bonney A. Date of Service: 03/23/2017 9:45 AM Medical Record Number: [id number] Patient Account Number: [id number] Date of Birth/Sex: Dec 18, 1940 (77 y.o. Female) Treating RN: Roger Shelter Primary Care Eshaan Titzer: Lavera [name] Other Clinician: Referring Shamecka Hocutt: Referral, Self Treating Ajani Rineer/Extender: Ricard Dillon Weeks in Treatment: 0 Wound Status Wound Number: 4 Primary Pressure Ulcer Etiology: Wound Location: Left Foot - Lateral Wound Open Wounding Event: Pressure Injury Status: Date Acquired: 03/07/2017 Comorbid  Cataracts, Anemia, Congestive Heart Failure, Weeks Of Treatment: 0 History: Coronary Artery Disease, Hypertension, Type II Clustered Wound: No Diabetes, Osteoarthritis, Neuropathy, Received Chemotherapy, Received Radiation Wound Measurements Length: (cm) 1.6 Width: (cm) 1 Depth: (cm) 0.1 Area: (cm) 1.257 Volume: (cm) 0.126 % Reduction in Area: % Reduction in Volume: Epithelialization: Small (1-33%) Tunneling: No Undermining: No Wound Description Classification: Category/Stage III Wound Margin: Flat and Intact Exudate Amount: Medium Exudate Type: Serous Exudate Color: amber Foul Odor After Cleansing: No Slough/Fibrino Yes Wound Bed Granulation Amount: Medium (34-66%) Exposed Structure Granulation Quality: Red Fascia Exposed: No Necrotic Amount: Medium (34-66%) Fat Layer (Subcutaneous Tissue) Exposed: Yes Necrotic Quality: Eschar, Adherent Slough Tendon Exposed: No Muscle Exposed: No Joint Exposed: No Bone Exposed: No Periwound Skin Texture Texture Color No Abnormalities Noted: No No Abnormalities Noted: No Callus: Yes Atrophie Blanche: No Crepitus: No Cyanosis: No Excoriation: No Ecchymosis: No Induration: Yes Erythema: No Rash: No Hemosiderin Staining: No Scarring: No Mottled: No Pallor: No Moisture Rubor: No No Abnormalities Noted: No Dry / Scaly: No Temperature / Pain Coleson, Vinisha A. ([id number]) Maceration: No Temperature: No Abnormality Tenderness on Palpation: Yes Wound Preparation Ulcer Cleansing: Rinsed/Irrigated with Saline Topical Anesthetic Applied: Other: lidocaine 4%, Treatment Notes Wound #4 (Left, Lateral Foot) 1. Cleansed with: Clean wound with Normal Saline 2. Anesthetic Topical Lidocaine 4% cream to wound bed prior to debridement 4. Dressing Applied: Other dressing (specify in notes) 5. Secondary Dressing Applied Bordered Foam Dressing 6. Footwear/Offloading device applied Surgical shoe Notes silver nitrate to wound in  clinic, silvercell to wound, boarder foam dressing with darko off loading shoe Electronic Signature(s) Signed: 03/23/2017 11:58:24 AM By: Roger Shelter Entered By: Roger Shelter on 03/23/2017 09:48:16 Riess, Kaylynn A. ([id number]) -------------------------------------------------------------------------------- Vitals Details Patient Name: Rohman, Cassadi A. Date of Service: 03/23/2017 9:45 AM Medical Record Number: [id number] Patient Account Number: [id number] Date of Birth/Sex: November 20, 1940 (77 y.o. Female) Treating RN: Roger Shelter Primary Care Marlow Berenguer: Lavera [name] Other Clinician: Referring Abeeha Twist: Referral, Self Treating Marq Rebello/Extender: Tito Dine in Treatment: 0 Vital Signs Time Taken: 09:32 Temperature (F): 97.6 Height (in): 62 Pulse (bpm): 64 Source: Stated Respiratory Rate (breaths/min): 18 Weight (lbs): 170.4 Blood Pressure (mmHg): 118/42 Source: Measured Reference Range:  80 - 120 mg / dl Body Mass Index (BMI): 31.2 Electronic Signature(s) Signed: 03/23/2017 11:58:24 AM By: Roger Shelter Entered By: Roger Shelter on 03/23/2017 09:33:15

## 2017-03-30 ENCOUNTER — Encounter: Payer: Medicare Other | Admitting: Nurse Practitioner

## 2017-03-30 DIAGNOSIS — E11621 Type 2 diabetes mellitus with foot ulcer: Secondary | ICD-10-CM | POA: Diagnosis not present

## 2017-04-01 NOTE — Progress Notes (Signed)
ELARIA, OSIAS ([id number]) Visit Report for 03/30/2017 Arrival Information Details Patient Name: [name], [name] Date of Service: 03/30/2017 11:15 AM Medical Record Number: [id number] Patient Account Number: [id number] Date of Birth/Sex: [birth year] (77 y.o. Female) Treating RN: Roger Shelter Primary Care Esmeralda Malay: Lavera [name] Other Clinician: Referring Iniya Matzek: Lavera [name] Treating Yamir Carignan/Extender: Cathie Olden in Treatment: 1 Visit Information History Since Last Visit All ordered tests and consults were completed: No Patient Arrived: [name] Added or deleted any medications: No Arrival Time: 11:19 Any new allergies or adverse reactions: No Accompanied By: self Had [name] or experienced change in No Transfer Assistance: None activities of daily living that may affect Patient Identification Verified: Yes risk of falls: Secondary Verification Process Completed: Yes Signs or symptoms of abuse/neglect since last visito No Hospitalized since last visit: No Pain Present Now: No Electronic Signature(s) Signed: 03/30/2017 4:45:38 PM By: Roger Shelter Entered By: Roger Shelter on 03/30/2017 11:20:02 [name] ([id number]) -------------------------------------------------------------------------------- Encounter Discharge Information Details Patient Name: [name], [name] Date of Service: 03/30/2017 11:15 AM Medical Record Number: [id number] Patient Account Number: [id number] Date of Birth/Sex: 07-27-1940 (77 y.o. Female) Treating RN: Cornell Barman Primary Care Ersa Delaney: Lavera [name] Other Clinician: Referring Henchy Mccauley: Lavera [name] Treating Gracen Ringwald/Extender: Cathie Olden in Treatment: 1 Encounter Discharge Information Items Discharge Pain Level: 0 Discharge Condition: Stable Ambulatory Status: Walker Discharge Destination: Home Transportation: Private Auto Accompanied By: self Schedule Follow-up Appointment: Yes Medication Reconciliation completed  and No provided to Patient/Care Noell Lorensen: Provided on Clinical Summary of Care: 03/30/2017 Form Type Recipient Paper Patient AW Electronic Signature(s) Signed: 03/30/2017 11:55:13 AM By: Montey Hora Entered By: Montey Hora on 03/30/2017 11:55:12 [name] ([id number]) -------------------------------------------------------------------------------- Lower Extremity Assessment Details Patient Name: [name], [name] Date of Service: 03/30/2017 11:15 AM Medical Record Number: [id number] Patient Account Number: [id number] Date of Birth/Sex: 27-Feb-1940 (77 y.o. Female) Treating RN: Roger Shelter Primary Care Jaliel Deavers: Lavera [name] Other Clinician: Referring Samanthajo Payano: Lavera [name] Treating Nyazia Canevari/Extender: Cathie Olden in Treatment: 1 Vascular Assessment Pulses: Dorsalis Pedis Palpable: [Left:Yes] Posterior Tibial Extremity colors, hair growth, and conditions: Extremity Color: [Left:Normal] Hair Growth on Extremity: [Left:No] Temperature of Extremity: [Left:Warm] Capillary Refill: [Left:< 3 seconds] Electronic Signature(s) Signed: 03/30/2017 4:45:38 PM By: Roger Shelter Entered By: Roger Shelter on 03/30/2017 11:29:44 [name] ([id number]) -------------------------------------------------------------------------------- Multi Wound Chart Details Patient Name: [name], [name] Date of Service: 03/30/2017 11:15 AM Medical Record Number: [id number] Patient Account Number: [id number] Date of Birth/Sex: 10/22/1940 (77 y.o. 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] [N/[name]:N/[name]] Comorbid History: [4:Cataracts, Anemia, Congestive Heart Failure, Coronary Artery Disease, Hypertension, Type II Diabetes, Osteoarthritis, Neuropathy, Received Chemotherapy, Received Radiation] [N/[name]:N/[name]] Date Acquired: [4:03/07/2017] [N/[name]:N/[name]] Weeks of Treatment: [4:1] [N/[name]:N/[name]] Wound Status: [4:Open] [N/[name]:N/[name]] Measurements L x W x D [4:0.5x0.8x0.1] [N/[name]:N/[name]] (cm) Area (cm) : [4:0.314] [N/[name]:N/[name]] Volume (cm) : [4:0.031] [N/[name]:N/[name]] % Reduction in Area: [4:75.00%] [N/[name]:N/[name]] % Reduction in Volume: [4:75.40%] [N/[name]:N/[name]] Classification: [4:Category/Stage III] [N/[name]:N/[name]] Exudate Amount: [4:Medium] [N/[name]:N/[name]] Exudate Type: [4:Serous] [N/[name]:N/[name]] Exudate Color: [4:amber] [N/[name]:N/[name]] Wound Margin: [4:Flat and Intact] [N/[name]:N/[name]] Granulation Amount: [4:Medium (34-66%)] [N/[name]:N/[name]] Granulation Quality: [4:Red] [N/[name]:N/[name]] Necrotic Amount: [4:Medium (34-66%)] [N/[name]:N/[name]] Necrotic Tissue: [4:Eschar, Adherent Slough] [N/[name]:N/[name]] Exposed Structures: [  4:Fat Layer (Subcutaneous Tissue) Exposed: Yes Fascia: No Tendon: No Muscle: No Joint: No Bone: No] [N/[name]:N/[name]] Epithelialization: Small (1-33%) N/[name] N/[name] Debridement: Debridement ([id number]) N/[name] N/[name] Pre-procedure 11:35 N/[name] N/[name] Verification/Time Out Taken: Pain Control: Lidocaine 4% Topical Solution N/[name] N/[name] Tissue Debrided: Fibrin/Slough, Skin, N/[name] N/[name] Subcutaneous Level: Skin/Subcutaneous Tissue N/[name] N/[name] Debridement Area (sq cm): 0.4 N/[name] N/[name] Instrument: Curette N/[name] N/[name] Bleeding: Minimum N/[name] N/[name] Hemostasis Achieved: Pressure N/[name] N/[name] Procedural Pain: 0 N/[name] N/[name] Post Procedural Pain: 0 N/[name] N/[name] Debridement Treatment Procedure was tolerated well N/[name] N/[name] Response: Post Debridement 0.3x0.7x0.2 N/[name] N/[name] Measurements L x W x D (cm) Post Debridement Volume: 0.033 N/[name] N/[name] (cm) Post Debridement Stage: Category/Stage III N/[name] N/[name] Periwound Skin Texture: Induration: Yes  N/[name] N/[name] Callus: Yes Excoriation: No Crepitus: No Rash: No Scarring: No Periwound Skin Moisture: Maceration: No N/[name] N/[name] Dry/Scaly: No Periwound Skin Color: Atrophie Blanche: No N/[name] N/[name] Cyanosis: No Ecchymosis: No Erythema: No Hemosiderin Staining: No Mottled: No Pallor: No Rubor: No Temperature: No Abnormality N/[name] N/[name] Tenderness on Palpation: Yes N/[name] N/[name] Wound Preparation: Ulcer Cleansing: N/[name] N/[name] Rinsed/Irrigated with Saline Topical Anesthetic Applied: Other: lidocaine 4% Procedures Performed: Debridement N/[name] N/[name] Treatment Notes Electronic Signature(s) Signed: 03/30/2017 11:46:25 AM By: Lawanda Cousins Entered By: Lawanda Cousins on 03/30/2017 11:46:25 Jacquez, Dynastie AMarland Kitchen ([id number]) -------------------------------------------------------------------------------- [place] Details Patient Name: [name], [name] Date of Service: 03/30/2017 11:15 AM Medical Record Number: [id number] Patient Account Number: [id number] Date of Birth/Sex: December 17, 1940 (77 y.o. Female) Treating RN: Montey Hora Primary Care Adlynn Lowenstein: Lavera [name] Other Clinician: Referring Maahir Horst: Lavera [name] Treating Kingslee Mairena/Extender: Cathie Olden in Treatment: 1 Active Inactive ` Orientation to the Wound Care Program Nursing Diagnoses: Knowledge deficit related to the wound healing center program Goals: Patient/caregiver will verbalize understanding of the [facility] Program Date Initiated: 03/23/2017 Target Resolution Date: 04/15/2017 Goal Status: Active Interventions: Provide education on orientation to the wound center Notes: ` Pressure Nursing Diagnoses: Potential for impaired tissue integrity related to pressure, friction, moisture, and shear Goals: Patient will remain free of pressure ulcers Date Initiated: 03/23/2017 Target Resolution Date: 04/22/2017 Goal Status: Active Interventions: Assess: immobility, friction, shearing, incontinence upon admission and as  needed Assess offloading mechanisms upon admission and as needed Notes: ` Wound/Skin Impairment Nursing Diagnoses: Knowledge deficit related to ulceration/compromised skin integrity Goals: Ulcer/skin breakdown will have [name] volume reduction of 30% by week 4 Date Initiated: 03/23/2017 Target Resolution Date: 04/23/2017 Goal Status: Active Bierly, Odette [name]. ([id number]) Interventions: Assess ulceration(s) every visit Treatment Activities: Topical wound management initiated : 03/23/2017 Notes: Electronic Signature(s) Signed: 03/30/2017 5:43:37 PM By: Montey Hora Entered By: Montey Hora on 03/30/2017 11:36:02 Filip, Kemi [name]. ([id number]) -------------------------------------------------------------------------------- Pain Assessment Details Patient Name: [name], [name] Date of Service: 03/30/2017 11:15 AM Medical Record Number: [id number] Patient Account Number: [id number] Date of Birth/Sex: [birth year] (77 y.o. Female) Treating RN: Roger Shelter Primary Care Takahiro Godinho: Lavera [name] Other Clinician: Referring Jeanne Diefendorf: Lavera [name] Treating Lakiesha Ralphs/Extender: Cathie Olden in Treatment: 1 Active Problems Location of Pain Severity and Description of Pain Patient Has Paino No Site Locations Pain Management and Medication Current Pain Management: Electronic Signature(s) Signed: 03/30/2017 4:45:38 PM By: Roger Shelter Entered By: Roger Shelter on 03/30/2017 11:20:07 [name], [name] AMarland Kitchen ([id number]) -------------------------------------------------------------------------------- Patient/Caregiver Education Details Patient Name: [name], [name] Date of Service: 03/30/2017 11:15 AM Medical Record Number: [id number] Patient Account Number: [id number] Date of Birth/Gender: [birth year] (77 y.o. Female) Treating RN: Montey Hora Primary Care Physician: Lavera [name] Other Clinician: Referring Physician: Lavera [name] Treating Physician/Extender: Rene Kocher,  Kimber Relic in Treatment:  1 Education Assessment Education Provided To: Patient Education Topics Provided Wound/Skin Impairment: Handouts: Other: wound care [name] sordered Methods: Demonstration, Explain/Verbal Responses: State content correctly Electronic Signature(s) Signed: 03/30/2017 5:43:37 PM By: Montey Hora Entered By: Montey Hora on 03/30/2017 11:55:29 [name], [name] ([id number]) -------------------------------------------------------------------------------- Wound Assessment Details Patient Name: [name], [name] Date of Service: 03/30/2017 11:15 AM Medical Record Number: [id number] Patient Account Number: [id number] Date of Birth/Sex: 08/15/1940 (77 y.o. Female) Treating RN: Roger Shelter Primary Care Maye Parkinson: Lavera [name] Other Clinician: Referring Iridessa Harrow: Lavera [name] Treating Joyce Leckey/Extender: Cathie Olden in Treatment: 1 Wound Status Wound Number: 4 Primary Pressure Ulcer Etiology: Wound Location: Left Foot - Lateral Wound Open Wounding Event: Pressure Injury Status: Date Acquired: 03/07/2017 Comorbid Cataracts, Anemia, Congestive Heart Failure, Weeks Of Treatment: 1 History: Coronary Artery Disease, Hypertension, Type II Clustered Wound: No Diabetes, Osteoarthritis, Neuropathy, Received Chemotherapy, Received Radiation Photos Photo Uploaded By: Montey Hora on 03/30/2017 16:25:00 Wound Measurements Length: (cm) 0.5 Width: (cm) 0.8 Depth: (cm) 0.1 Area: (cm) 0.314 Volume: (cm) 0.031 % Reduction in Area: 75% % Reduction in Volume: 75.4% Epithelialization: Small (1-33%) Tunneling: No Undermining: No Wound Description Classification: Category/Stage III Wound Margin: Flat and Intact Exudate Amount: Medium Exudate Type: Serous Exudate Color: amber Foul Odor After Cleansing: No Slough/Fibrino Yes Wound Bed Granulation Amount: Medium (34-66%) Exposed Structure Granulation Quality: Red Fascia Exposed: No Necrotic Amount: Medium (34-66%) Fat Layer  (Subcutaneous Tissue) Exposed: Yes Necrotic Quality: Eschar, Adherent Slough Tendon Exposed: No Muscle Exposed: No Joint Exposed: No Bone Exposed: No [name], [name] ([id number]) Periwound Skin Texture Texture Color No Abnormalities Noted: No No Abnormalities Noted: No Callus: Yes Atrophie Blanche: No Crepitus: No Cyanosis: No Excoriation: No Ecchymosis: No Induration: Yes Erythema: No Rash: No Hemosiderin Staining: No Scarring: No Mottled: No Pallor: No Moisture Rubor: No No Abnormalities Noted: No Dry / Scaly: No Temperature / Pain Maceration: No Temperature: No Abnormality Tenderness on Palpation: Yes Wound Preparation Ulcer Cleansing: Rinsed/Irrigated with Saline Topical Anesthetic Applied: Other: lidocaine 4%, Treatment Notes Wound #4 (Left, Lateral Foot) 1. Cleansed with: Clean wound with Normal Saline 2. Anesthetic Topical Lidocaine 4% cream to wound bed prior to debridement 3. Peri-wound Care: Skin Prep 4. Dressing Applied: Aquacel Prisma Ag 5. Secondary Dressing Applied Bordered Foam Dressing Electronic Signature(s) Signed: 03/30/2017 4:45:38 PM By: Roger Shelter Entered By: Roger Shelter on 03/30/2017 11:29:27 [name], [name] ([id number]) -------------------------------------------------------------------------------- [place] Details Patient Name: [name], [name] Date of Service: 03/30/2017 11:15 AM Medical Record Number: [id number] Patient Account Number: [id number] Date of Birth/Sex: 1940/04/20 (77 y.o. Female) Treating RN: Roger Shelter Primary Care Kymere Fullington: Lavera [name] Other Clinician: Referring Roizy Harold: Lavera [name] Treating Johntae Broxterman/Extender: Cathie Olden in Treatment: 1 Vital Signs Time Taken: 11:22 Temperature (F): 98.0 Height (in): 62 Pulse (bpm): 71 Weight (lbs): 170.4 Respiratory Rate (breaths/min): 18 Body Mass Index (BMI): 31.2 Blood Pressure (mmHg): 129/47 Reference Range: 80 - 120 mg / dl Electronic  Signature(s) Signed: 03/30/2017 4:45:38 PM By: Roger Shelter Entered By: Roger Shelter on 03/30/2017 11:22:03

## 2017-04-03 NOTE — Progress Notes (Signed)
[name] ([id number]) Visit Report for 03/30/2017 Chief Complaint Document Details Patient Name: [name] Date of Service: 03/30/2017 11:15 AM Medical Record Number: [id number] Patient Account Number: [id number] Date of Birth/Sex: 11-23-1940 (78 y.o. F) Treating RN: Cornell Barman Primary Care Provider: Lavera [name] Other Clinician: Referring Provider: Lavera [name] Treating Provider/Extender: Cathie Olden in Treatment: 1 Information Obtained from: Patient Chief Complaint She is here in follow up evaluation for [name] left heel ulcer Electronic Signature(s) Signed: 03/30/2017 11:46:48 AM By: Lawanda Cousins Entered By: Lawanda Cousins on 03/30/2017 11:46:48 [name] ([id number]) -------------------------------------------------------------------------------- Debridement Details Patient Name: [name] Date of Service: 03/30/2017 11:15 AM Medical Record Number: [id number] Patient Account Number: [id number] Date of Birth/Sex: 1940-08-12 (77 y.o. F) Treating RN: Montey Hora Primary Care Provider: Lavera [name] Other Clinician: Referring Provider: Lavera [name] Treating Provider/Extender: Cathie Olden in Treatment: 1 Debridement Performed for Wound #4 Left,Lateral Foot Assessment: Performed By: Physician Lawanda Cousins, NP Debridement Type: Debridement Pre-procedure Verification/Time Yes - 11:35 Out Taken: Start Time: 11:35 Pain Control: Lidocaine 4% Topical Solution Level: Skin/Subcutaneous Tissue Total Area Debrided (L x W): 0.5 (cm) x 0.8 (cm) = 0.4 (cm) Tissue and other material Viable, Non-Viable, Fibrin/Slough, Skin, Subcutaneous debrided: Instrument: Curette Bleeding: Minimum Hemostasis Achieved: Pressure End Time: 11:38 Procedural Pain: 0 Post Procedural Pain: 0 Response to Treatment: Procedure was tolerated well Post Debridement Measurements of Total Wound Length: (cm) 0.3 Stage: Category/Stage III Width: (cm) 0.7 Depth: (cm) 0.2 Volume:  (cm) 0.033 Character of Wound/Ulcer Post Improved Debridement: Post Procedure Diagnosis Same as Pre-procedure Electronic Signature(s) Signed: 03/30/2017 5:31:19 PM By: Lawanda Cousins Signed: 03/30/2017 5:43:37 PM By: Montey Hora Entered By: Montey Hora on 03/30/2017 11:38:59 [name] ([id number]) -------------------------------------------------------------------------------- HPI Details Patient Name: [name] Date of Service: 03/30/2017 11:15 AM Medical Record Number: [id number] Patient Account Number: [id number] Date of Birth/Sex: 1940-11-21 (77 y.o. F) Treating RN: Cornell Barman Primary Care Provider: Lavera [name] Other Clinician: Referring Provider: Lavera [name] Treating Provider/Extender: Cathie Olden in Treatment: 1 History of Present Illness HPI Description: 12/24/15; [name] is [name] 77 year old woman who is the mother of one of our other clinic patient's Tammy Spickler. He comes [place] today for review [name] nonhealing surgical CABG wound. The patient underwent CABG o2 on October 31 17 at [facility]. She apparently had had [name] myocardial infarction in October but briefly presented with shortness of breath. She underwent surgery which I understand was uneventful. According to the patient her surgical incision never really healed in 2 spots. One is at the inferior recess question drain site, the other more superiorly about an inch from the superior surface of this it incision. She has been followed by her surgeon at [facility] who felt that both areas which he'll according to the patient. She was put on Augmentin 2 weeks ago and she is finished [name] course of this culture at that time showed Escherichia coli which should've been sensitive. The patient also relates that she has [name] history of allergy to ampicillin. Besides this the patient has [name] history of type 2 diabetes on metformin, multiple myeloma. She had [name] chest x-ray in mid October the did not show any  abnormalities. I cannot see that she had any specific views of the sternum however. We have her referral note from her primary physician Dr. Bernie Covey. She was concerned about erythema around the wound and as mentioned the culture here showed Escherichia coli which should have been Augmentin sensitive but was resistant  to ampicillin, Keflex third generation cephalosporins quinolones and sulfonamides. [ESBL). The patient does not complain of fever or chills excessive pain. She does say that the lower surgical wound has been draining what looks to her like pus. She has been using triple antibiotic cream. 12/31/15 superior wound actually looks somewhat better. Inferiorly still [name] wound with some depth greater than 2 cm. Using Aquacel Ag. Culture I did of the drainage from the inferior wound and this surgical incision last week was negative 01/07/16; superior wound still requires debridement of surface eschar and nonviable subcutaneous tissue. Inferior wound measured at 1.8 cm. What I can see of the tissue here does not look very healthy. Original culture that I did on her presentation was negative however 01/27/16; the superior wound is closed she still has [name] deep probing wound of roughly 2 cm inferiorly. There is no overt infection no surrounding erythema. The patient tells me that 2 weeks ago she was developing chest pain and underwent [name] nuclear stress test. This was apparently negative however she developed chest pain shortly thereafter and had to go have [name] cardiac catheterization that did not show blockages. Since then she is not having chest pain but has unrelenting nausea making it difficult for her to eat. We reviewed her medications and she is on aspirin and Plavix however she apparently has [name] stent in place as well. 02/03/16; still using silver alginate. Depth of this is down from 1.8 cm the circumference is smaller and the depth down by 2 mm red no drainage and no surrounding  infection 02/10/16; still using silver alginate depth down to 1.3 cm today measured by myself. Appears to be making slow progress. There is no surrounding tenderness.. The patient states she has unrelenting nausea which is nonexertional. Not related to meals. She has had no vomiting. She also tells me she is had previous lap band surgery but does not give clear reflux type symptoms 02/25/16; the wound orifice of this lady's wound on her chest is down to 1 cm in depth and much smaller in terms of the overall diameter. She tells me that she was packing the wound recently and developed quite [name] bit of bleeding that took several hours to stop she is on Plavix and aspirin. She is not having any exertional symptoms that sound like coronary artery disease. She is complaining of unrelenting nausea but she is being evaluated by GI for this and apparently has an endoscopy planned for March. We have been using iodoform packing 03/03/16; small wound in terms of orifice but measures 1.2 cm in depth, not much different from last time. She still complains of nausea and has an upper GI series books she is not describing exertional chest pain, shortness of breath, nausea or fatigue. 03/16/16; I follow this patient for [name] nonhealing surgical wound at [name] site of [name] previous CABG scar. She developed cellulitis in the incision postoperatively. Today she arrives with the wound orifice to miniscule to even consider measuring [name] depth. She came in with [name] Band-Aid over this, she could not get any of the iodoform packing in the increasingly small orifice 03/31/16; the patient's nonhealing surgical wound at the site of her previous CABG scar/inferior aspect is totally epithelialized. She tells me there was some bleeding out of this which was minimal last week however with careful inspection there is no evidence of any open here everything looks healthy and epithelialize. Surrounding tissue also looks normal Bonzo, Emmett [name].  ([id number]) the patient arrives today with  erythema and [name] significant blister mostly involving the dorsal aspect of her right fifth finger. There were 2 small almost looked like puncture wounds on the anterior aspect of the DIP joint and the medial aspect of the finger. The area was tender and warm. There was no involvement of any joint 04/20/16; The patient returns today for one last followup. Her original chest wound has remained closed which is fortunate, She came in last time with [name] right 5th finger blister and erythema. she did not tolerate the aquacel AG. C+S I did was negative. since I last saw her she has [name] skin tear on the right lateral elbow while doing [name] medical test READMISSION 03/23/17;Mrs. Nhem is [name] now 77 year old woman that we cared for in this clinic about [name] year ago with nonhealing wounds at the site of her CABG. She has [name] type II diabetic with diabetic neuropathy. At the point of this dictation I don't have [name] hemoglobin A1c however she is only on oral agents. She tells me that she noted [name] blister on her left lateral heel about 3 weeks ago. She brought this to attention of her primary doctor and was given DuoDERM. Later on her daughter actually removed the surface of this and some surrounding skin. She is in for our review of this. She does not have [name] history of wounds on her lower extremities. She does not have known PAD. She does have neuropathy in her feet ABIs in this clinic were noncompressible bilaterally 03/30/17-she is here in follow up evaluation for [name] left heel wound. She is voicing no complaints or concerns. She states she could not wear the surgical shoe. She continues to have maceration although not significantly different. We will switch to Prisma and she'll follow-up next week Electronic Signature(s) Signed: 03/30/2017 11:47:32 AM By: Lawanda Cousins Entered By: Lawanda Cousins on 03/30/2017 11:47:32 Heal, [name]  ([id number]) -------------------------------------------------------------------------------- Physician Orders Details Patient Name: Misko, Erinn [name]. Date of Service: 03/30/2017 11:15 AM Medical Record Number: [id number] Patient Account Number: [id number] Date of Birth/Sex: 1940-03-27 (77 y.o. F) Treating RN: Montey Hora Primary Care Provider: Lavera [name] Other Clinician: Referring Provider: Lavera [name] Treating Provider/Extender: Cathie Olden in Treatment: 1 Verbal / Phone Orders: No Diagnosis Coding Wound Cleansing Wound #4 Left,Lateral Foot o Cleanse wound with mild soap and water o May Shower, gently pat wound dry prior to applying new dressing. Anesthetic (add to Medication List) Wound #4 Left,Lateral Foot o Topical Lidocaine 4% cream applied to wound bed prior to debridement (In Clinic Only). Primary Wound Dressing Wound #4 Left,Lateral Foot o Aquacel o Prisma Ag Secondary Dressing Wound #4 Left,Lateral Foot o Boardered Foam Dressing Dressing Change Frequency Wound #4 Left,Lateral Foot o Three times weekly Follow-up Appointments Wound #4 Left,Lateral Foot o Return Appointment in 1 week. Off-Loading Wound #4 Left,Lateral Foot o Open toe surgical shoe to: - Left foot Electronic Signature(s) Signed: 03/30/2017 5:31:19 PM By: Lawanda Cousins Signed: 03/30/2017 5:43:37 PM By: Montey Hora Entered By: Montey Hora on 03/30/2017 11:39:33 [name] ([id number]) -------------------------------------------------------------------------------- Problem List Details Patient Name: [name] Date of Service: 03/30/2017 11:15 AM Medical Record Number: [id number] Patient Account Number: [id number] Date of Birth/Sex: [birth year] (77 y.o. F) Treating RN: Cornell Barman Primary Care Provider: Lavera [name] Other Clinician: Referring Provider: Lavera [name] Treating Provider/Extender: Cathie Olden in Treatment: 1 Active Problems ICD-10 Impacting  Encounter Code Description Active Date Wound Healing Diagnosis E11.621 Type 2 diabetes mellitus with foot ulcer 03/23/2017 Yes L97.521 Non-pressure chronic ulcer  of other part of left foot limited to 03/23/2017 Yes breakdown of skin E11.42 Type 2 diabetes mellitus with diabetic polyneuropathy 03/23/2017 Yes Inactive Problems Resolved Problems Electronic Signature(s) Signed: 03/30/2017 11:46:20 AM By: Lawanda Cousins Entered By: Lawanda Cousins on 03/30/2017 11:46:19 [name] ([id number]) -------------------------------------------------------------------------------- Progress Note Details Patient Name: [name] Date of Service: 03/30/2017 11:15 AM Medical Record Number: [id number] Patient Account Number: [id number] Date of Birth/Sex: 05-06-1940 (77 y.o. F) Treating RN: Cornell Barman Primary Care Provider: Lavera [name] Other Clinician: Referring Provider: Lavera [name] Treating Provider/Extender: Cathie Olden in Treatment: 1 Subjective Chief Complaint Information obtained from Patient She is here in follow up evaluation for [name] left heel ulcer History of Present Illness (HPI) 12/24/15; Mrs. Mott is [name] 77 year old woman who is the mother of one of our other clinic patient's Tammy Barrack. He comes [place] today for review [name] nonhealing surgical CABG wound. The patient underwent CABG o2 on October 31 17 at [facility]. She apparently had had [name] myocardial infarction in October but briefly presented with shortness of breath. She underwent surgery which I understand was uneventful. According to the patient her surgical incision never really healed in 2 spots. One is at the inferior recess question drain site, the other more superiorly about an inch from the superior surface of this it incision. She has been followed by her surgeon at [facility] who felt that both areas which he'll according to the patient. She was put on Augmentin 2 weeks ago and she is finished [name] course of this culture  at that time showed Escherichia coli which should've been sensitive. The patient also relates that she has [name] history of allergy to ampicillin. Besides this the patient has [name] history of type 2 diabetes on metformin, multiple myeloma. She had [name] chest x-ray in mid October the did not show any abnormalities. I cannot see that she had any specific views of the sternum however. We have her referral note from her primary physician Dr. Bernie Covey. She was concerned about erythema around the wound and as mentioned the culture here showed Escherichia coli which should have been Augmentin sensitive but was resistant to ampicillin, Keflex third generation cephalosporins quinolones and sulfonamides. [ESBL). The patient does not complain of fever or chills excessive pain. She does say that the lower surgical wound has been draining what looks to her like pus. She has been using triple antibiotic cream. 12/31/15 superior wound actually looks somewhat better. Inferiorly still [name] wound with some depth greater than 2 cm. Using Aquacel Ag. Culture I did of the drainage from the inferior wound and this surgical incision last week was negative 01/07/16; superior wound still requires debridement of surface eschar and nonviable subcutaneous tissue. Inferior wound measured at 1.8 cm. What I can see of the tissue here does not look very healthy. Original culture that I did on her presentation was negative however 01/27/16; the superior wound is closed she still has [name] deep probing wound of roughly 2 cm inferiorly. There is no overt infection no surrounding erythema. The patient tells me that 2 weeks ago she was developing chest pain and underwent [name] nuclear stress test. This was apparently negative however she developed chest pain shortly thereafter and had to go have [name] cardiac catheterization that did not show blockages. Since then she is not having chest pain but has unrelenting nausea making it difficult for her to  eat. We reviewed her medications and she is on aspirin and Plavix however she apparently  has [name] stent in place as well. 02/03/16; still using silver alginate. Depth of this is down from 1.8 cm the circumference is smaller and the depth down by 2 mm red no drainage and no surrounding infection 02/10/16; still using silver alginate depth down to 1.3 cm today measured by myself. Appears to be making slow progress. There is no surrounding tenderness.. The patient states she has unrelenting nausea which is nonexertional. Not related to meals. She has had no vomiting. She also tells me she is had previous lap band surgery but does not give clear reflux type symptoms 02/25/16; the wound orifice of this lady's wound on her chest is down to 1 cm in depth and much smaller in terms of the overall diameter. She tells me that she was packing the wound recently and developed quite [name] bit of bleeding that took several hours to stop she is on Plavix and aspirin. She is not having any exertional symptoms that sound like coronary artery disease. She is complaining of unrelenting nausea but she is being evaluated by GI for this and apparently has an endoscopy planned for March. We have been using iodoform packing 03/03/16; small wound in terms of orifice but measures 1.2 cm in depth, not much different from last time. She still complains of nausea and has an upper GI series books she is not describing exertional chest pain, shortness of breath, nausea or fatigue. [name] ([id number]) 03/16/16; I follow this patient for [name] nonhealing surgical wound at [name] site of [name] previous CABG scar. She developed cellulitis in the incision postoperatively. Today she arrives with the wound orifice to miniscule to even consider measuring [name] depth. She came in with [name] Band-Aid over this, she could not get any of the iodoform packing in the increasingly small orifice 03/31/16; the patient's nonhealing surgical wound at the site of her previous  CABG scar/inferior aspect is totally epithelialized. She tells me there was some bleeding out of this which was minimal last week however with careful inspection there is no evidence of any open here everything looks healthy and epithelialize. Surrounding tissue also looks normal the patient arrives today with erythema and [name] significant blister mostly involving the dorsal aspect of her right fifth finger. There were 2 small almost looked like puncture wounds on the anterior aspect of the DIP joint and the medial aspect of the finger. The area was tender and warm. There was no involvement of any joint 04/20/16; The patient returns today for one last followup. Her original chest wound has remained closed which is fortunate, She came in last time with [name] right 5th finger blister and erythema. she did not tolerate the aquacel AG. C+S I did was negative. since I last saw her she has [name] skin tear on the right lateral elbow while doing [name] medical test READMISSION 03/23/17;Mrs. Brandhorst is [name] now 77 year old woman that we cared for in this clinic about [name] year ago with nonhealing wounds at the site of her CABG. She has [name] type II diabetic with diabetic neuropathy. At the point of this dictation I don't have [name] hemoglobin A1c however she is only on oral agents. She tells me that she noted [name] blister on her left lateral heel about 3 weeks ago. She brought this to attention of her primary doctor and was given DuoDERM. Later on her daughter actually removed the surface of this and some surrounding skin. She is in for our review of this. She does not have [name] history  of wounds on her lower extremities. She does not have known PAD. She does have neuropathy in her feet ABIs in this clinic were noncompressible bilaterally 03/30/17-she is here in follow up evaluation for [name] left heel wound. She is voicing no complaints or concerns. She states she could not wear the surgical shoe. She continues to have maceration although not  significantly different. We will switch to Prisma and she'll follow-up next week Patient History Information obtained from Patient. Family History Cancer - Father, Diabetes - Mother, Heart Disease - Mother, Hypertension - Mother, No family history of Hereditary Spherocytosis, Kidney Disease, Lung Disease, Seizures, Stroke, Thyroid Problems, Tuberculosis. Social History Never smoker, Marital Status - Divorced, Alcohol Use - Never, Drug Use - No History, Caffeine Use - Moderate. 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 Bord, Kahlani [name]. ([id number]) Integumentary (Hair, Skin) Wound #4 status is Open. Original cause of wound was Pressure Injury. The wound is located on the Left,Lateral Foot. The wound measures 0.5cm length x 0.8cm width x 0.1cm depth; 0.314cm^2 area and 0.031cm^3 volume. There is Fat Layer (Subcutaneous Tissue) Exposed exposed. There is no tunneling or undermining noted. There is [name] medium amount of serous drainage noted. The wound margin is flat and intact. There is medium (34-66%) red granulation within the wound bed. There is [name] medium (34-66%) amount of necrotic tissue within the wound bed including Eschar and Adherent Slough. The periwound skin appearance exhibited: Callus, Induration. The periwound skin appearance did not exhibit: Crepitus, Excoriation, Rash, Scarring, Dry/Scaly, Maceration, Atrophie Blanche, Cyanosis, Ecchymosis, Hemosiderin Staining, Mottled, Pallor, Rubor, Erythema. Periwound temperature was noted as No Abnormality. The periwound has tenderness on palpation. Assessment Active Problems ICD-10 E11.621 - Type 2 diabetes mellitus with foot ulcer L97.521 - Non-pressure chronic ulcer of other part of left foot  limited to breakdown of skin E11.42 - Type 2 diabetes mellitus with diabetic polyneuropathy Procedures Wound #4 Pre-procedure diagnosis of Wound #4 is [name] Pressure Ulcer located on the Left,Lateral Foot . There was [name] Skin/Subcutaneous Tissue Debridement ([id number]) debridement with total area of 0.4 sq cm performed by Lawanda Cousins, NP. with the following instrument(s): Curette to remove Viable and Non-Viable tissue/material including Fibrin/Slough, Skin, and Subcutaneous after achieving pain control using Lidocaine 4% Topical Solution. [name] time out was conducted at 11:35, prior to the start of the procedure. [name] Minimum amount of bleeding was controlled with Pressure. The procedure was tolerated well with [name] pain level of 0 throughout and [name] pain level of 0 following the procedure. Post Debridement Measurements: 0.3cm length x 0.7cm width x 0.2cm depth; 0.033cm^3 volume. Post debridement Stage noted as Category/Stage III. Character of Wound/Ulcer Post Debridement is improved. Post procedure Diagnosis Wound #4: Same as Pre-Procedure Plan Wound Cleansing: Wound #4 Left,Lateral Foot: Cleanse wound with mild soap and water May Shower, gently pat wound dry prior to applying new dressing. Anesthetic (add to Medication List): Wound #4 Left,Lateral Foot: Topical Lidocaine 4% cream applied to wound bed prior to debridement (In Clinic Only). Primary Wound Dressing: Wound #4 Left,Lateral Foot: [name] ([id number]) Aquacel Prisma Ag Secondary Dressing: Wound #4 Left,Lateral Foot: Boardered Foam Dressing Dressing Change Frequency: Wound #4 Left,Lateral Foot: Three times weekly Follow-up Appointments: Wound #4 Left,Lateral Foot: Return Appointment in 1 week. Off-Loading: Wound #4 Left,Lateral Foot: Open toe surgical shoe to: -  Left foot 1. prisma, alginate 2. offload 3. follow up next week Electronic Signature(s) Signed: 03/30/2017 11:48:06 AM By: Lawanda Cousins Entered By: Lawanda Cousins  on 03/30/2017 11:48:05 [name] ([id number]) -------------------------------------------------------------------------------- ROS/PFSH Details Patient Name: [name] Date of Service: 03/30/2017 11:15 AM Medical Record Number: [id number] Patient Account Number: [id number] Date of Birth/Sex: 26-Apr-1940 (77 y.o. F) Treating RN: Cornell Barman Primary Care Provider: Lavera [name] Other Clinician: Referring Provider: Lavera [name] Treating Provider/Extender: Cathie Olden in Treatment: 1 Information Obtained From Patient Wound History Do you currently have one or more open woundso Yes How many open wounds do you currently haveo 1 Approximately how long have you had your woundso 3 weeks How have you been treating your wound(s) until nowo derm cream Has your wound(s) ever healed and then re-openedo No Have you had any lab work done in the past montho Yes Have you tested positive for an antibiotic resistant organism (MRSA, VRE)o No Have you tested positive for osteomyelitis (bone infection)o No Have you had any tests for circulation on your legso No Eyes Medical History: Positive for: Cataracts - removed Hematologic/Lymphatic Medical History: Positive for: Anemia Cardiovascular Medical History: Positive for: Congestive Heart Failure; Coronary Artery Disease; Hypertension Negative for: Arrhythmia Endocrine Medical History: Positive for: Type II Diabetes Time with diabetes: at least 20 years Treated with: Oral agents Blood sugar tested every day: Yes Tested : QD Blood sugar testing results: Breakfast: 121 Immunological Medical History: Negative for: Lupus Erythematosus; Raynaudos; Scleroderma Musculoskeletal Medical History: Positive for: Osteoarthritis Pherigo, [name] 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 HBO Extended History Items Eyes: Cataracts Immunizations Pneumococcal Vaccine: Received Pneumococcal Vaccination: No Tetanus Vaccine: Last tetanus shot: 03/12/2015 Immunization Notes: up to date Implantable Devices Family and Social History Cancer: Yes - Father; Diabetes: Yes - Mother; Heart Disease: Yes - Mother; Hereditary Spherocytosis: No; Hypertension: Yes - Mother; Kidney Disease: No; Lung Disease: No; Seizures: No; Stroke: No; Thyroid Problems: No; Tuberculosis: No; Never smoker; Marital Status - Divorced; Alcohol Use: Never; Drug Use: No History; Caffeine Use: Moderate; Financial Concerns: No; Food, Clothing or Shelter Needs: No; Support System Lacking: No; Transportation Concerns: No; Advanced Directives: Yes (Not Provided); Patient does not want information on Advanced Directives; Living Will: Yes (Not Provided); Medical Power of Attorney: Yes - Deam Chaddock - son (Not Provided) Physician Affirmation I have reviewed and agree with the above information. Electronic Signature(s) Signed: 03/30/2017 5:31:19 PM By: Lawanda Cousins Signed: 04/01/2017 4:43:23 PM By: Gretta Cool, BSN, RN, CWS, Kim RN, BSN Entered By: Lawanda Cousins on 03/30/2017 11:47:40 Mchugh, [name] ([id number]) -------------------------------------------------------------------------------- [place] Details Patient Name: Supinski, Oluwaseun [name]. Date of Service: 03/30/2017 Medical Record Number: [id number] Patient Account Number: [id number] Date of Birth/Sex: 1941/01/01 (77 y.o. 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 Facility Procedures CPT4 Code Description: [id number] - DEB SUBQ TISSUE 20 SQ CM/< ICD-10 Diagnosis Description L97.521 Non-pressure chronic ulcer of other part of left foot limited t E11.621 Type 2 diabetes mellitus with foot  ulcer Modifier: o breakdown of s Quantity: 1 kin Physician Procedures CPT4 Code Description: [id number] - WC PHYS SUBQ TISS 20 SQ CM ICD-10 Diagnosis Description L97.521 Non-pressure chronic ulcer of other part of left foot limited t E11.621 Type 2 diabetes mellitus with foot ulcer Modifier: o breakdown of sk Quantity: 1 in Electronic Signature(s) Signed: 03/30/2017 11:48:19 AM By: Lawanda Cousins Entered By: Lawanda Cousins on 03/30/2017 11:48:18

## 2017-04-06 ENCOUNTER — Encounter: Payer: Medicare Other | Admitting: Internal Medicine

## 2017-04-06 DIAGNOSIS — E11621 Type 2 diabetes mellitus with foot ulcer: Secondary | ICD-10-CM | POA: Diagnosis not present

## 2017-04-08 NOTE — Progress Notes (Signed)
[name], [name] ([id number]) Visit Report for 04/06/2017 HPI Details Patient Name: [name], [name] Date of Service: 04/06/2017 2:45 PM Medical Record Number: [id number] Patient Account Number: [id number] Date of Birth/Sex: Dec 22, 1940 (77 y.o. F) Treating RN: Cornell Barman Primary Care Provider: Lavera [name] Other Clinician: Referring Provider: Lavera [name] Treating Provider/Extender: Tito Dine in Treatment: 2 History of Present Illness HPI Description: 12/24/15; [name] is a 77 year old woman who is the mother of one of our other clinic patient's [name] He comes [place] today for review a nonhealing surgical CABG wound. The patient underwent CABG o2 on October 31 17 at [facility]. She apparently had had a myocardial infarction in October but briefly presented with shortness of breath. She underwent surgery which I understand was uneventful. According to the patient her surgical incision never really healed in 2 spots. One is at the inferior recess question drain site, the other more superiorly about an inch from the superior surface of this it incision. She has been followed by her surgeon at [facility] who felt that both areas which he'll according to the patient. She was put on Augmentin 2 weeks ago and she is finished a course of this culture at that time showed Escherichia coli which should've been sensitive. The patient also relates that she has a history of allergy to ampicillin. Besides this the patient has a history of type 2 diabetes on metformin, multiple myeloma. She had a chest x-ray in mid October the did not show any abnormalities. I cannot see that she had any specific views of the sternum however. We have her referral note from her primary physician Dr. Bernie Covey. She was concerned about erythema around the wound and as mentioned the culture here showed Escherichia coli which should have been Augmentin sensitive but was resistant to ampicillin, Keflex  third generation cephalosporins quinolones and sulfonamides. [ESBL). The patient does not complain of fever or chills excessive pain. She does say that the lower surgical wound has been draining what looks to her like pus. She has been using triple antibiotic cream. 12/31/15 superior wound actually looks somewhat better. Inferiorly still a wound with some depth greater than 2 cm. Using Aquacel Ag. Culture I did of the drainage from the inferior wound and this surgical incision last week was negative 01/07/16; superior wound still requires debridement of surface eschar and nonviable subcutaneous tissue. Inferior wound measured at 1.8 cm. What I can see of the tissue here does not look very healthy. Original culture that I did on her presentation was negative however 01/27/16; the superior wound is closed she still has a deep probing wound of roughly 2 cm inferiorly. There is no overt infection no surrounding erythema. The patient tells me that 2 weeks ago she was developing chest pain and underwent a nuclear stress test. This was apparently negative however she developed chest pain shortly thereafter and had to go have a cardiac catheterization that did not show blockages. Since then she is not having chest pain but has unrelenting nausea making it difficult for her to eat. We reviewed her medications and she is on aspirin and Plavix however she apparently has a stent in place as well. 02/03/16; still using silver alginate. Depth of this is down from 1.8 cm the circumference is smaller and the depth down by 2 mm red no drainage and no surrounding infection 02/10/16; still using silver alginate depth down to 1.3 cm today measured by myself. Appears to be making slow progress. There  is no surrounding tenderness.. The patient states she has unrelenting nausea which is nonexertional. Not related to meals. She has had no vomiting. She also tells me she is had previous lap band surgery but does not give  clear reflux type symptoms 02/25/16; the wound orifice of this lady's wound on her chest is down to 1 cm in depth and much smaller in terms of the overall diameter. She tells me that she was packing the wound recently and developed quite a bit of bleeding that took several hours to stop she is on Plavix and aspirin. She is not having any exertional symptoms that sound like coronary artery disease. She is complaining of unrelenting nausea but she is being evaluated by GI for this and apparently has an endoscopy planned for March. We have been using iodoform packing 03/03/16; small wound in terms of orifice but measures 1.2 cm in depth, not much different from last time. She still complains of nausea and has an upper GI series books she is not describing exertional chest pain, shortness of breath, nausea or fatigue. 03/16/16; I follow this patient for a nonhealing surgical wound at a site of a previous CABG scar. She developed cellulitis in the incision postoperatively. Today she arrives with the wound orifice to miniscule to even consider measuring a depth. She came in with a Band-Aid over this, she could not get any of the iodoform packing in the increasingly small orifice [name], [name] ([id number]) 03/31/16; the patient's nonhealing surgical wound at the site of her previous CABG scar/inferior aspect is totally epithelialized. She tells me there was some bleeding out of this which was minimal last week however with careful inspection there is no evidence of any open here everything looks healthy and epithelialize. Surrounding tissue also looks normal the patient arrives today with erythema and a significant blister mostly involving the dorsal aspect of her right fifth finger. There were 2 small almost looked like puncture wounds on the anterior aspect of the DIP joint and the medial aspect of the finger. The area was tender and warm. There was no involvement of any joint 04/20/16; The patient returns  today for one last followup. Her original chest wound has remained closed which is fortunate, She came in last time with a right 5th finger blister and erythema. she did not tolerate the aquacel AG. C+S I did was negative. since I last saw her she has a skin tear on the right lateral elbow while doing a medical test READMISSION 03/23/17;[name] is a now 77 year old woman that we cared for in this clinic about a year ago with nonhealing wounds at the site of her CABG. She has a type II diabetic with diabetic neuropathy. At the point of this dictation I don't have a hemoglobin A1c however she is only on oral agents. She tells me that she noted a blister on her left lateral heel about 3 weeks ago. She brought this to attention of her primary doctor and was given DuoDERM. Later on her daughter actually removed the surface of this and some surrounding skin. She is in for our review of this. She does not have a history of wounds on her lower extremities. She does not have known PAD. She does have neuropathy in her feet ABIs in this clinic were noncompressible bilaterally 03/30/17-she is here in follow up evaluation for a left heel wound. She is voicing no complaints or concerns. She states she could not wear the surgical shoe. She continues to have  maceration although not significantly different. We will switch to [facility] and she'll follow-up next week 04/06/17; patient is here for follow-up evaluation of the left heel wound. She has been using Prisma. The area is closed this week Electronic Signature(s) Signed: 04/07/2017 8:15:41 AM By: [name] MD Entered By: [name] on 04/06/2017 16:04:37 Chapdelaine, Shanai AMarland Kitchen ([id number]) -------------------------------------------------------------------------------- Physical Exam Details Patient Name: Harari, Kaily A. Date of Service: 04/06/2017 2:45 PM Medical Record Number: [id number] Patient Account Number: [id number] Date of Birth/Sex: 1940/11/22 (77  y.o. F) Treating RN: Cornell Barman Primary Care Provider: Lavera [name] Other Clinician: Referring Provider: Lavera [name] Treating Provider/Extender: Ricard Dillon Weeks in Treatment: 2 Constitutional Sitting or standing Blood Pressure is within target range for patient.. Pulse regular and within target range for patient.Marland Kitchen Respirations regular, non-labored and within target range.. Temperature is normal and within the target range for the patient.Marland Kitchen appears in no distress. Notes wound exam; the area on the lateral part of the left heel is closed. There was some eschar on this which I gently removed with a curette there is no open wound. Electronic Signature(s) Signed: 04/07/2017 8:15:41 AM By: [name] MD Entered By: [name] on 04/06/2017 16:05:33 Vacha, [name] ([id number]) -------------------------------------------------------------------------------- Physician Orders Details Patient Name: Demartin, Georgianne A. Date of Service: 04/06/2017 2:45 PM Medical Record Number: [id number] Patient Account Number: [id number] Date of Birth/Sex: 01-18-1940 (77 y.o. F) Treating RN: Cornell Barman Primary Care Provider: Lavera [name] Other Clinician: Referring Provider: Lavera [name] Treating Provider/Extender: Tito Dine in Treatment: 2 Verbal / Phone Orders: No Diagnosis Coding Discharge From [facility] Services Wound #4 [place] o Discharge from [place] complete-cover dressing for protection of new skin Electronic Signature(s) Signed: 04/06/2017 5:06:18 PM By: Gretta Cool, BSN, RN, CWS, Kim RN, BSN Signed: 04/07/2017 8:15:41 AM By: [name] MD Entered By: Gretta Cool, BSN, RN, CWS, Kim on 04/06/2017 15:22:18 Morrell, [name] ([id number]) -------------------------------------------------------------------------------- Problem List Details Patient Name: Hurston, Lakayla A. Date of Service: 04/06/2017 2:45 PM Medical Record Number: [id number] Patient Account Number:  [id number] Date of Birth/Sex: 1940-07-16 (77 y.o. 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 ([id number]) -------------------------------------------------------------------------------- Progress Note Details Patient Name: Kauer, Anysia A. Date of Service: 04/06/2017 2:45 PM Medical Record Number: [id number] Patient Account Number: [id number] Date of Birth/Sex: [birth year] (77 y.o. F) Treating RN: Cornell Barman Primary Care Provider: Lavera [name] Other Clinician: Referring Provider: Lavera [name] Treating Provider/Extender: Tito Dine in Treatment: 2 Subjective History of Present Illness (HPI) 12/24/15; Mrs. Nilson is a 77 year old woman who is the mother of one of our other clinic patient's [name] Chapel. He comes [place] today for review a nonhealing surgical CABG wound. The patient underwent CABG o2 on October 31 17 at [facility]. She apparently had had a myocardial infarction in October but briefly presented with shortness of breath. She underwent surgery which I understand was uneventful. According to the patient her surgical incision never really healed in 2 spots.  One is at the inferior recess question drain site, the other more superiorly about an inch from the superior surface of this it incision. She has been followed  by her surgeon at [facility] who felt that both areas which he'll according to the patient. She was put on Augmentin 2 weeks ago and she is finished a course of this culture at that time showed Escherichia coli which should've been sensitive. The patient also relates that she has a history of allergy to ampicillin. Besides this the patient has a history of type 2 diabetes on metformin, multiple myeloma. She had a chest x-ray in mid October the did not show any abnormalities. I cannot see that she had any specific views of the sternum however. We have her referral note from her primary physician Dr. Bernie Covey. She was concerned about erythema around the wound and as mentioned the culture here showed Escherichia coli which should have been Augmentin sensitive but was resistant to ampicillin, Keflex third generation cephalosporins quinolones and sulfonamides. [ESBL). The patient does not complain of fever or chills excessive pain. She does say that the lower surgical wound has been draining what looks to her like pus. She has been using triple antibiotic cream. 12/31/15 superior wound actually looks somewhat better. Inferiorly still a wound with some depth greater than 2 cm. Using Aquacel Ag. Culture I did of the drainage from the inferior wound and this surgical incision last week was negative 01/07/16; superior wound still requires debridement of surface eschar and nonviable subcutaneous tissue. Inferior wound measured at 1.8 cm. What I can see of the tissue here does not look very healthy. Original culture that I did on her presentation was negative however 01/27/16; the superior wound is closed she still has a deep probing wound of roughly 2 cm inferiorly. There is no overt infection no surrounding erythema. The patient tells me that 2 weeks ago she was developing chest pain and underwent a nuclear stress test. This was apparently negative however she developed chest pain shortly thereafter and had  to go have a cardiac catheterization that did not show blockages. Since then she is not having chest pain but has unrelenting nausea making it difficult for her to eat. We reviewed her medications and she is on aspirin and Plavix however she apparently has a stent in place as well. 02/03/16; still using silver alginate. Depth of this is down from 1.8 cm the circumference is smaller and the depth down by 2 mm red no drainage and no surrounding infection 02/10/16; still using silver alginate depth down to 1.3 cm today measured by myself. Appears to be making slow progress. There is no surrounding tenderness.. The patient states she has unrelenting nausea which is nonexertional. Not related to meals. She has had no vomiting. She also tells me she is had previous lap band surgery but does not give clear reflux type symptoms 02/25/16; the wound orifice of this lady's wound on her chest is down to 1 cm in depth and much smaller in terms of the overall diameter. She tells me that she was packing the wound recently and developed quite a bit of bleeding that took several hours to stop she is on Plavix and aspirin. She is not having any exertional symptoms that sound like coronary artery disease. She is complaining of unrelenting nausea but she is being evaluated by GI for this and apparently has an endoscopy planned for March. We have been using iodoform packing 03/03/16; small wound in terms of  orifice but measures 1.2 cm in depth, not much different from last time. She still complains of nausea and has an upper GI series books she is not describing exertional chest pain, shortness of breath, nausea or fatigue. 03/16/16; I follow this patient for a nonhealing surgical wound at a site of a previous CABG scar. She developed cellulitis in the incision postoperatively. Today she arrives with the wound orifice to miniscule to even consider measuring a depth. She came in with a Band-Aid over this, she could not get  any of the iodoform packing in the increasingly small orifice 03/31/16; the patient's nonhealing surgical wound at the site of her previous CABG scar/inferior aspect is totally epithelialized. She tells me there was some bleeding out of this which was minimal last week however with careful inspection there is no [name], [name] ([id number]) evidence of any open here everything looks healthy and epithelialize. Surrounding tissue also looks normal the patient arrives today with erythema and a significant blister mostly involving the dorsal aspect of her right fifth finger. There were 2 small almost looked like puncture wounds on the anterior aspect of the DIP joint and the medial aspect of the finger. The area was tender and warm. There was no involvement of any joint 04/20/16; The patient returns today for one last followup. Her original chest wound has remained closed which is fortunate, She came in last time with a right 5th finger blister and erythema. she did not tolerate the aquacel AG. C+S I did was negative. since I last saw her she has a skin tear on the right lateral elbow while doing a medical test READMISSION 03/23/17;[name] is a now 77 year old woman that we cared for in this clinic about a year ago with nonhealing wounds at the site of her CABG. She has a type II diabetic with diabetic neuropathy. At the point of this dictation I don't have a hemoglobin A1c however she is only on oral agents. She tells me that she noted a blister on her left lateral heel about 3 weeks ago. She brought this to attention of her primary doctor and was given DuoDERM. Later on her daughter actually removed the surface of this and some surrounding skin. She is in for our review of this. She does not have a history of wounds on her lower extremities. She does not have known PAD. She does have neuropathy in her feet ABIs in this clinic were noncompressible bilaterally 03/30/17-she is here in follow up evaluation  for a left heel wound. She is voicing no complaints or concerns. She states she could not wear the surgical shoe. She continues to have maceration although not significantly different. We will switch to [facility] and she'll follow-up next week 04/06/17; patient is here for follow-up evaluation of the left heel wound. She has been using Prisma. The area is closed this week Objective Constitutional Sitting or standing Blood Pressure is within target range for patient.. Pulse regular and within target range for patient.Marland Kitchen Respirations regular, non-labored and within target range.. Temperature is normal and within the target range for the patient.Marland Kitchen appears in no distress. Vitals Time Taken: 2:56 PM, Height: 62 in, Weight: 170.4 lbs, BMI: 31.2, Temperature: 97.9 F, Pulse: 75 bpm, Respiratory Rate: 18 breaths/min, Blood Pressure: 127/46 mmHg. General Notes: wound exam; the area on the lateral part of the left heel is closed. There was some eschar on this which I gently removed with a curette there is no open wound. Integumentary (Hair, Skin) Wound #  4 status is Open. Original cause of wound was Pressure Injury. The wound is located on the Left,Lateral Foot. The wound measures 0.1cm length x 0.1cm width x 0.1cm depth; 0.008cm^2 area and 0.001cm^3 volume. There is Fat Layer (Subcutaneous Tissue) Exposed exposed. There is no tunneling or undermining noted. There is a none present amount of drainage noted. The wound margin is flat and intact. There is no granulation within the wound bed. There is a large (67-100%) amount of necrotic tissue within the wound bed including Eschar. The periwound skin appearance exhibited: Callus. The periwound skin appearance did not exhibit: Crepitus, Excoriation, Induration, Rash, Scarring, Dry/Scaly, Maceration, Atrophie Blanche, Cyanosis, Ecchymosis, Hemosiderin Staining, Mottled, Pallor, Rubor, Erythema. Periwound temperature was noted as No Abnormality. The periwound has  tenderness on palpation. [name], [name] ([id number]) Assessment Active Problems ICD-10 E11.621 - Type 2 diabetes mellitus with foot ulcer L97.521 - Non-pressure chronic ulcer of other part of left foot limited to breakdown of skin E11.42 - Type 2 diabetes mellitus with diabetic polyneuropathy Plan Discharge From [facility] Services: Wound #4 Left,Lateral Foot: Discharge from [place] complete-cover dressing for protection of new skin #1 I have advised to keep the area covered for protection with a foam border or a thick Band-Aid. #2 the patient can be discharged from the clinic Electronic Signature(s) Signed: 04/07/2017 8:15:41 AM By: [name] MD Entered By: [name] on 04/06/2017 16:06:55 [name], [name] ([id number]) -------------------------------------------------------------------------------- SuperBill Details Patient Name: [name], [name] Date of Service: 04/06/2017 Medical Record Number: [id number] Patient Account Number: [id number] Date of Birth/Sex: Aug 07, 1940 (77 y.o. F) Treating RN: Cornell Barman Primary Care Provider: Lavera [name] Other Clinician: Referring Provider: Lavera [name] Treating Provider/Extender: Tito Dine in Treatment: 2 Diagnosis Coding ICD-10 Codes Code Description E11.621 Type 2 diabetes mellitus with foot ulcer L97.521 Non-pressure chronic ulcer of other part of left foot limited to breakdown of skin E11.42 Type 2 diabetes mellitus with diabetic polyneuropathy Facility Procedures CPT4 Code: [id number] Description: [phone] - WOUND CARE VISIT-LEV 2 EST PT Modifier: Quantity: 1 Physician Procedures CPT4 Code Description: [id number] - WC PHYS LEVEL 2 - EST PT ICD-10 Diagnosis Description E11.621 Type 2 diabetes mellitus with foot ulcer L97.521 Non-pressure chronic ulcer of other part of left foot limited t Modifier: o breakdown of sk Quantity: 1 in Electronic Signature(s) Signed: 04/07/2017 8:15:41 AM By: [name] MD Entered By: [name] on 04/06/2017 16:07:15

## 2017-04-08 NOTE — Progress Notes (Addendum)
[name] ([id number]) Visit Report for 04/06/2017 Arrival Information Details Patient Name: [name], [name] Date of Service: 04/06/2017 2:45 PM Medical Record Number: [id number] Patient Account Number: [id number] Date of Birth/Sex: Aug 13, 1940 (77 y.o. F) Treating RN: [name] Primary Care Larisa Lanius: [name] Other Clinician: Referring [name]: [name] Treating [name]: [name] in Treatment: 2 Visit Information History Since Last Visit All ordered tests and consults were completed: No Patient Arrived: [name] Added or deleted any medications: No Arrival Time: 14:55 Any new allergies or adverse reactions: No Accompanied By: self Had a fall or experienced change in No Transfer Assistance: None activities of daily living that may affect Patient Identification Verified: Yes risk of falls: Secondary Verification Process Completed: Yes Signs or symptoms of abuse/neglect since last visito No Hospitalized since last visit: No Implantable device outside of the clinic excluding No cellular tissue based products placed in the center since last visit: Pain Present Now: No Electronic Signature(s) Signed: 04/06/2017 4:10:38 PM By: [name] Entered By: [name] on 04/06/2017 14:55:37 [name], [name] ([id number]) -------------------------------------------------------------------------------- Clinic Level of Care Assessment Details Patient Name: [name], [name] Date of Service: 04/06/2017 2:45 PM Medical Record Number: [id number] Patient Account Number: [id number] Date of Birth/Sex: 05-08-1940 (77 y.o. F) Treating RN: [name] Primary Care Jaleen Finch: [name] Other Clinician: Referring [name]: [name] Treating [name]: [name] in Treatment: 2 Clinic Level of Care Assessment Items TOOL 4 Quantity Score []  - Use when only an EandM is performed on FOLLOW-UP visit 0 ASSESSMENTS - Nursing Assessment /  Reassessment []  - Reassessment of Co-morbidities (includes updates in patient status) 0 []  - 0 Reassessment of Adherence to Treatment Plan ASSESSMENTS - Wound and Skin Assessment / Reassessment X - Simple Wound Assessment / Reassessment - one wound 1 5 []  - 0 Complex Wound Assessment / Reassessment - multiple wounds []  - 0 Dermatologic / Skin Assessment (not related to wound area) ASSESSMENTS - Focused Assessment []  - Circumferential Edema Measurements - multi extremities 0 []  - 0 Nutritional Assessment / Counseling / Intervention []  - 0 Lower Extremity Assessment (monofilament, tuning fork, pulses) []  - 0 Peripheral Arterial Disease Assessment (using hand held doppler) ASSESSMENTS - Ostomy and/or Continence Assessment and Care []  - Incontinence Assessment and Management 0 []  - 0 Ostomy Care Assessment and Management (repouching, etc.) PROCESS - Coordination of Care X - Simple Patient / Family Education for ongoing care 1 15 []  - 0 Complex (extensive) Patient / Family Education for ongoing care []  - 0 Staff obtains Programmer, systems, Records, Test Results / Process Orders []  - 0 Staff telephones HHA, Nursing Homes / Clarify orders / etc []  - 0 Routine Transfer to another Facility (non-emergent condition) []  - 0 Routine Hospital Admission (non-emergent condition) []  - 0 New Admissions / Biomedical engineer / Ordering NPWT, Apligraf, etc. []  - 0 Emergency Hospital Admission (emergent condition) X- 1 10 Simple Discharge Coordination [name], [name] ([id number]) []  - 0 Complex (extensive) Discharge Coordination PROCESS - Special Needs []  - Pediatric / Minor Patient Management 0 []  - 0 Isolation Patient Management []  - 0 Hearing / Language / Visual special needs []  - 0 Assessment of Community assistance (transportation, D/C planning, etc.) []  - 0 Additional assistance / Altered mentation []  - 0 Support Surface(s) Assessment (bed, cushion, seat, etc.) INTERVENTIONS - Wound  Cleansing / Measurement X - Simple Wound Cleansing - one wound 1 5 []  - 0 Complex Wound Cleansing - multiple wounds []  - 0 Wound Imaging (  photographs - any number of wounds) []  - 0 Wound Tracing (instead of photographs) []  - 0 Simple Wound Measurement - one wound X- 1 5 Complex Wound Measurement - multiple wounds INTERVENTIONS - Wound Dressings X - Small Wound Dressing one or multiple wounds 1 10 []  - 0 Medium Wound Dressing one or multiple wounds []  - 0 Large Wound Dressing one or multiple wounds []  - 0 Application of Medications - topical []  - 0 Application of Medications - injection INTERVENTIONS - Miscellaneous []  - External ear exam 0 []  - 0 Specimen Collection (cultures, biopsies, blood, body fluids, etc.) []  - 0 Specimen(s) / Culture(s) sent or taken to Lab for analysis []  - 0 Patient Transfer (multiple staff / Civil Service fast streamer / Similar devices) []  - 0 Simple Staple / Suture removal (25 or less) []  - 0 Complex Staple / Suture removal (26 or more) []  - 0 Hypo / Hyperglycemic Management (close monitor of Blood Glucose) []  - 0 Ankle / Brachial Index (ABI) - do not check if billed separately X- 1 5 Vital Signs [name], [name] ([id number]) Has the patient been seen at the hospital within the last three years: Yes Total Score: 55 Level Of Care: New/Established - Level 2 Electronic Signature(s) Signed: 04/06/2017 5:06:18 PM By: [name] Cool, BSN, RN, CWS, Kim RN, BSN Entered By: [name] on 04/06/2017 15:24:42 [name] ([id number]) -------------------------------------------------------------------------------- Encounter Discharge Information Details Patient Name: [name], [name] Date of Service: 04/06/2017 2:45 PM Medical Record Number: [id number] Patient Account Number: [id number] Date of Birth/Sex: 10-28-1940 (77 y.o. F) Treating RN: [name] Primary Care Rage Beever: [name] Other Clinician: Referring Janei Scheff: [name] Treating  [name]/Extender: [name] in Treatment: 2 Encounter Discharge Information Items Schedule Follow-up Appointment: No Medication Reconciliation completed and No provided to Patient/Care Sherrian Nunnelley: Provided on Clinical Summary of Care: 04/06/2017 Form Type Recipient Paper Patient AW Electronic Signature(s) Signed: 04/07/2017 9:24:56 AM By: Ruthine Dose Entered By: Ruthine Dose on 04/06/2017 15:26:42 Demauro, Aniqa A. ([id number]) -------------------------------------------------------------------------------- Lower Extremity Assessment Details Patient Name: [name], [name] Date of Service: 04/06/2017 2:45 PM Medical Record Number: [id number] Patient Account Number: [id number] Date of Birth/Sex: [birth year] (77 y.o. F) Treating RN: [name] Primary Care Amberlin Utke: [name] Other Clinician: Referring Lizbeth Feijoo: [name] Treating Aiesha Leland/Extender: [name] in Treatment: 2 Vascular Assessment Pulses: Dorsalis Pedis Palpable: [Left:Yes] Posterior Tibial Extremity colors, hair growth, and conditions: Extremity Color: [Left:Normal] Temperature of Extremity: [Left:Warm] Capillary Refill: [Left:< 3 seconds] Toe Nail Assessment Left: Right: Thick: Yes Discolored: Yes Deformed: No Improper Length and Hygiene: Yes Electronic Signature(s) Signed: 04/06/2017 4:10:38 PM By: [name] Entered By: [name] on 04/06/2017 15:02:26 Lollar, Sindy A. ([id number]) -------------------------------------------------------------------------------- Multi Wound Chart Details Patient Name: [name], [name] Date of Service: 04/06/2017 2:45 PM Medical Record Number: [id number] Patient Account Number: [id number] Date of Birth/Sex: 1940/11/03 (77 y.o. 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] [N/A:N/A] Comorbid History: [4:Cataracts, Anemia, Congestive Heart Failure, Coronary Artery Disease, Hypertension, Type II Diabetes, Osteoarthritis, Neuropathy, Received Chemotherapy, Received Radiation] [N/A:N/A] Date Acquired: [4:03/07/2017] [N/A:N/A] Weeks of Treatment: [4:2] [N/A:N/A] Wound Status: [4:Open] [N/A:N/A] Measurements L x W x  D [4:0.1x0.1x0.1] [N/A:N/A] (cm) Area (cm) : [4:0.008] [N/A:N/A] Volume (cm) : [4:0.001] [N/A:N/A] % Reduction in Area: [4:99.40%] [N/A:N/A] % Reduction in Volume: [4:99.20%] [N/A:N/A] Classification: [4:Category/Stage III] [N/A:N/A] Exudate Amount: [4:None Present] [N/A:N/A] Wound Margin: [4:Flat and Intact] [N/A:N/A] Granulation Amount: [4:None Present (0%)] [N/A:N/A] Necrotic Amount: [4:Large (67-100%)] [N/A:N/A] Necrotic Tissue: [4:Eschar] [N/A:N/A] Exposed Structures: [4:Fat Layer (Subcutaneous Tissue) Exposed: Yes Fascia: No Tendon: No Muscle: No Joint: No Bone: No] [N/A:N/A] Epithelialization: [4:Small (1-33%)] [N/A:N/A] Periwound Skin Texture: [4:Callus: Yes Excoriation: No] [N/A:N/A] Induration: No Crepitus: No Rash: No Scarring: No Periwound Skin Moisture: Maceration: No N/A N/A Dry/Scaly: No Periwound Skin Color: Atrophie Blanche: No N/A N/A Cyanosis: No Ecchymosis: No Erythema: No Hemosiderin Staining: No Mottled: No Pallor: No Rubor: No Temperature: No Abnormality N/A N/A Tenderness on Palpation: Yes N/A N/A Wound Preparation: Ulcer Cleansing: N/A N/A Rinsed/Irrigated with Saline Topical Anesthetic Applied: Other: lidocaine 4% Treatment Notes Electronic Signature(s) Signed: 04/07/2017 8:15:41 AM  By: [name] MD Entered By: [name] on 04/06/2017 16:03:44 Stech, [name] ([id number]) -------------------------------------------------------------------------------- [place] Details Patient Name: [name], [name] Date of Service: 04/06/2017 2:45 PM Medical Record Number: [id number] Patient Account Number: [id number] Date of Birth/Sex: 1940-04-18 (76 y.o. F) Treating RN: [name] Primary Care Amyre Segundo: [name] Other Clinician: Referring Kijana Estock: [name] Treating Olevia Westervelt/Extender: [name] in Treatment: 2 Active Inactive Electronic Signature(s) Signed: 04/07/2017 10:31:22 AM By: [name] Cool, BSN, RN, CWS, Kim RN, BSN Previous Signature: 04/06/2017 5:06:18 PM Version By: [name] Cool, BSN, RN, CWS, Kim RN, BSN Entered By: [name] on 04/07/2017 10:31:21 Gittins, [name] ([id number]) -------------------------------------------------------------------------------- Pain Assessment Details Patient Name: [name], [name] Date of Service: 04/06/2017 2:45 PM Medical Record Number: [id number] Patient Account Number: [id number] Date of Birth/Sex: 31-Mar-1940 (77 y.o. F) Treating RN: [name] Primary Care Katherine Tout: [name] Other Clinician: Referring Noretta Frier: [name] Treating Arn Mcomber/Extender: [name] in Treatment: 2 Active Problems Location of Pain Severity and Description of Pain Patient Has Paino No Site Locations Pain Management and Medication Current Pain Management: Electronic Signature(s) Signed: 04/06/2017 4:10:38 PM By: [name] Entered By: [name] on 04/06/2017 14:55:47 Manganaro, Almendra A. ([id number]) -------------------------------------------------------------------------------- Wound Assessment Details Patient Name: [name], [name] Date of Service: 04/06/2017 2:45 PM Medical Record Number: [id number] Patient Account Number: [id number] Date of Birth/Sex: 12-11-1940 (77 y.o.  F) Treating RN: [name] Primary Care Maddilyn Campus: [name] Other Clinician: Referring Madylin Fairbank: [name] Treating Breck Hollinger/Extender: [name] in Treatment: 2 Wound Status Wound Number: 4 Primary Pressure Ulcer Etiology: Wound Location: Left, Lateral Foot Wound Healed - Epithelialized Wounding Event: Pressure Injury Status: Date Acquired: 03/07/2017 Comorbid Cataracts, Anemia, Congestive Heart Failure, Weeks Of Treatment: 2 History: Coronary Artery Disease, Hypertension, Type II Clustered Wound: No Diabetes, Osteoarthritis, Neuropathy, Received Chemotherapy, Received Radiation Photos Photo Uploaded By: Alric Quan on 04/06/2017 16:18:55 Wound Measurements Length: (cm) 0 Width: (cm) 0 Depth: (cm) 0 Area: (cm) 0 Volume: (cm) 0 % Reduction in Area: 100% % Reduction in Volume: 100% Epithelialization: Small (1-33%) Tunneling: No Undermining: No Wound Description Classification: Category/Stage III Wound Margin: Flat and Intact Exudate Amount: None Present Foul Odor After Cleansing: No Slough/Fibrino Yes Wound Bed Granulation Amount: None Present (0%) Exposed Structure Necrotic Amount: Large (67-100%) Fascia Exposed: No Necrotic Quality: Eschar Fat Layer (Subcutaneous Tissue) Exposed: Yes Tendon Exposed: No Muscle Exposed: No Joint Exposed: No Bone Exposed: No Periwound Skin Texture Texture Color Winkowski, Judith A. ([id number]) No Abnormalities Noted: No No Abnormalities Noted: No Callus:  Yes Atrophie Blanche: No Crepitus: No Cyanosis: No Excoriation: No Ecchymosis: No Induration: No Erythema: No Rash: No Hemosiderin Staining: No Scarring: No Mottled: No Pallor: No Moisture Rubor: No No Abnormalities Noted: No Dry / Scaly: No Temperature / Pain Maceration: No Temperature: No Abnormality Tenderness on Palpation: Yes Wound Preparation Ulcer Cleansing: Rinsed/Irrigated with Saline Topical Anesthetic Applied: Other: lidocaine  4%, Electronic Signature(s) Signed: 04/08/2017 1:03:41 PM By: [name] Cool, BSN, RN, CWS, Kim RN, BSN Previous Signature: 04/06/2017 4:10:38 PM Version By: [name] Entered By: [name] Cool BSN, RN, CWS, [name] on 04/07/2017 11:11:49 Whidby, [name] ([id number]) -------------------------------------------------------------------------------- Vitals Details Patient Name: [name], Aanchal A. Date of Service: 04/06/2017 2:45 PM Medical Record Number: [id number] Patient Account Number: [id number] Date of Birth/Sex: 1940-09-18 (77 y.o. F) Treating RN: [name] Primary Care Azjah Pardo: [name] Other Clinician: Referring Klarisa [name]: [name] Treating Damyan Corne/Extender: [name] in Treatment: 2 Vital Signs Time Taken: 14:56 Temperature (F): 97.9 Height (in): 62 Pulse (bpm): 75 Weight (lbs): 170.4 Respiratory Rate (breaths/min): 18 Body Mass Index (BMI): 31.2 Blood Pressure (mmHg): 127/46 Reference Range: 80 - 120 mg / dl Electronic Signature(s) Signed: 04/06/2017 4:10:38 PM By: [name] Entered By: [name] on 04/06/2017 15:00:23

## 2017-04-13 MED ORDER — INSULIN LISPRO 100 UNIT/ML ~~LOC~~ SOLN
.00 | SUBCUTANEOUS | Status: DC
Start: 2017-04-13 — End: 2017-04-13

## 2017-04-13 MED ORDER — LIDOCAINE HCL (PF) 1 % IJ SOLN
0.50 | INTRAMUSCULAR | Status: DC
Start: ? — End: 2017-04-13

## 2017-04-13 MED ORDER — MELATONIN 3 MG PO TABS
3.00 | ORAL_TABLET | ORAL | Status: DC
Start: 2017-04-12 — End: 2017-04-13

## 2017-04-13 MED ORDER — ACETAMINOPHEN 325 MG PO TABS
650.00 | ORAL_TABLET | ORAL | Status: DC
Start: ? — End: 2017-04-13

## 2017-04-13 MED ORDER — SENNOSIDES-DOCUSATE SODIUM 8.6-50 MG PO TABS
2.00 | ORAL_TABLET | ORAL | Status: DC
Start: 2017-04-13 — End: 2017-04-13

## 2017-04-13 MED ORDER — TRIAMCINOLONE ACETONIDE 0.1 % EX OINT
TOPICAL_OINTMENT | CUTANEOUS | Status: DC
Start: 2017-04-13 — End: 2017-04-13

## 2017-04-13 MED ORDER — DIPHENHYDRAMINE HCL 25 MG PO CAPS
50.00 | ORAL_CAPSULE | ORAL | Status: DC
Start: ? — End: 2017-04-13

## 2017-04-13 MED ORDER — LISINOPRIL 5 MG PO TABS
5.00 | ORAL_TABLET | ORAL | Status: DC
Start: 2017-04-13 — End: 2017-04-13

## 2017-04-13 MED ORDER — DEXTROSE 50 % IV SOLN
12.50 | INTRAVENOUS | Status: DC
Start: ? — End: 2017-04-13

## 2017-04-13 MED ORDER — FENTANYL 50 MCG/HR TD PT72
1.00 | MEDICATED_PATCH | TRANSDERMAL | Status: DC
Start: 2017-04-14 — End: 2017-04-13

## 2017-04-13 MED ORDER — INSULIN GLARGINE 100 UNIT/ML ~~LOC~~ SOLN
21.00 | SUBCUTANEOUS | Status: DC
Start: 2017-04-13 — End: 2017-04-13

## 2017-04-13 MED ORDER — GENERIC EXTERNAL MEDICATION
12.50 | Status: DC
Start: 2017-04-13 — End: 2017-04-13

## 2017-04-13 MED ORDER — CLOPIDOGREL BISULFATE 75 MG PO TABS
75.00 | ORAL_TABLET | ORAL | Status: DC
Start: 2017-04-13 — End: 2017-04-13

## 2017-04-13 MED ORDER — CEPHALEXIN 500 MG PO CAPS
500.00 | ORAL_CAPSULE | ORAL | Status: DC
Start: 2017-04-12 — End: 2017-04-13

## 2017-04-13 MED ORDER — GABAPENTIN 300 MG PO CAPS
600.00 | ORAL_CAPSULE | ORAL | Status: DC
Start: 2017-04-12 — End: 2017-04-13

## 2017-04-13 MED ORDER — ONDANSETRON HCL 4 MG PO TABS
4.00 | ORAL_TABLET | ORAL | Status: DC
Start: ? — End: 2017-04-13

## 2017-04-13 MED ORDER — PANTOPRAZOLE SODIUM 40 MG PO TBEC
40.00 | DELAYED_RELEASE_TABLET | ORAL | Status: DC
Start: 2017-04-13 — End: 2017-04-13

## 2017-04-13 MED ORDER — GLUCAGON HCL RDNA (DIAGNOSTIC) 1 MG IJ SOLR
1.00 | INTRAMUSCULAR | Status: DC
Start: ? — End: 2017-04-13

## 2017-04-13 MED ORDER — ASPIRIN EC 81 MG PO TBEC
81.00 | DELAYED_RELEASE_TABLET | ORAL | Status: DC
Start: 2017-04-13 — End: 2017-04-13

## 2017-04-13 MED ORDER — FLUTICASONE PROPIONATE 50 MCG/ACT NA SUSP
2.00 | NASAL | Status: DC
Start: ? — End: 2017-04-13

## 2017-04-21 NOTE — Progress Notes (Deleted)
04/22/2017 8:48 AM   [name] Dec 24, 1940 [id number]  Referring provider: Lynnell Jude, MD [address] [place],  [address]  No chief complaint on file.   HPI: 77 yo WF with a history of nephrolithiasis who returns today for a one year follow up.  Background history This 77 year old seen for ureteral stone.  She had left lower quadrant and left flank pain and plain xray was equivocal. On 03/28/2016 she underwent a CT scan which revealed a 3 mm left proximal ureteral stone. Minimal hydro, but bilateral extrarenal pelvis. Her white count was 10.2, BUN 29, creatinine 1.49 (up from 1.26).  UA showed 0-5 white cells, 6-30 red cells.  KUB taken on 04/02/2016 noted bowel gas pattern unremarkable.  Lap band at gastric cardia.  Probable calculi upper pole right kidney region. No ureteral calculi evident by radiography. Probable phleboliths in the pelvis. Iliac artery atherosclerosis noted.    RUS performed on 04/13/2016 noted Interval resolution of mild hydronephrosis on the left. The UPJ stone is not visible. No other stones on the left are observed.  There are several tiny stones in the right kidney with the largest measuring 9 mm lying in the lower pole. There is no right-sided hydronephrosis.    Contrast CT on 09/12/2016 demonstrated adrenal glands are unremarkable. Kidneys are normal, without renal calculi, focal lesion, or hydronephrosis. Gas bubble in the nondilated urinary bladder presumably from recent instrumentation.       PMH: Past Medical History:  Diagnosis Date  . Anginal pain ([place])   . Anxiety   . Arthritis    "shoulders primarily" (01/13/2016)  . CAD (coronary artery disease)    s/p CABG in Oct 2017  . CHF (congestive heart failure) ([place])   . Chronic pain    from her multiple myeloma but says that her pain is usually in her legs/notes 01/13/2016  . High cholesterol   . History of blood transfusion    "numerous; related to procedures for my heart"  (01/13/2016)  . Hypertension   . Ischemic cardiomyopathy    /nots 01/13/2016  . MI (myocardial infarction) ([place])    EKG on arrival 01/13/2016 showed NSR with evidence of old anterior and inferior infarcts  . MI (myocardial infarction) ([place]) 08/2015  . Multiple myeloma ([place]) dx'd 2015   off chemo since 2016  . Nausea and vomiting 05/10/2016   3-4 month hx of nausea and vomiting  . PAD (peripheral artery disease) ([place])   . Stroke [facility])    "several in 1 year; ? year" (01/13/2016)  . Type II diabetes mellitus ([place])     Surgical History: Past Surgical History:  Procedure Laterality Date  . CARDIAC CATHETERIZATION N/A 01/14/2016   Procedure: Left Heart Cath and Cors/[place]s Angiography;  Surgeon: Burnell Blanks, MD;  Location: [facility];  Service: Cardiovascular;  Laterality: N/A;  . CARPAL TUNNEL RELEASE Left   . CORONARY ANGIOPLASTY WITH STENT PLACEMENT  05/2015   DES placed to the mid and distal LAD as well as OM2/notes 01/13/2016  . CORONARY ANGIOPLASTY WITH STENT PLACEMENT  ~ 2010  . CORONARY ARTERY BYPASS GRAFT  11/11/2015   CABG X2 LIMA-LAD and SVG-OM/notes 01/13/2016  . DILATION AND CURETTAGE OF UTERUS    . FEMORAL-POPLITEAL BYPASS GRAFT Left 2013   Archie Endo 01/13/2016  . LAPAROSCOPIC GASTRIC BANDING  2007  . RENAL ARTERY STENT    . VAGINAL HYSTERECTOMY      Home Medications:  Allergies as of 04/22/2017  Reactions   Other Swelling   Smoke--Head swelling   Statins Swelling   Metoclopramide Other (See Comments)   Causes tremors and insomnia   Amoxicillin    Codeine    Sulfa Antibiotics    Duloxetine Nausea Only      Medication List        Accurate as of 04/21/17  8:48 AM. Always use your most recent med list.          aspirin 81 MG tablet Take 81 mg by mouth daily.   diclofenac sodium 1 % Gel Commonly known as:  VOLTAREN Apply 2 g topically daily as needed for pain.   feeding supplement (ENSURE ENLIVE) Liqd Take 237 mLs by mouth 2 (two) times daily  between meals.   fentaNYL 50 MCG/HR Commonly known as:  DURAGESIC - dosed mcg/hr Place 50 mcg onto the skin every 3 (three) days.   Fish Oil 1000 MG Caps Take 2 capsules (2,000 mg total) by mouth daily.   fluticasone 50 MCG/ACT nasal spray Commonly known as:  FLONASE Place 2 sprays into both nostrils daily as needed for allergies.   furosemide 20 MG tablet Commonly known as:  LASIX Take 1 tablet (20 mg total) by mouth daily as needed for fluid or edema.   gabapentin 300 MG capsule Commonly known as:  NEURONTIN Take 300-600 mg by mouth See admin instructions. Pt takes 654m in am, 3040mat bedtime   hydrocortisone cream 1 % Apply topically as needed for itching.   isosorbide mononitrate 60 MG 24 hr tablet Commonly known as:  IMDUR Take 1 tablet (60 mg total) by mouth daily.   lisinopril 5 MG tablet Commonly known as:  PRINIVIL,ZESTRIL Take 1 tablet (5 mg total) by mouth daily.   metFORMIN 500 MG tablet Commonly known as:  GLUCOPHAGE Take 500 mg by mouth 2 (two) times daily with a meal.   metoprolol tartrate 25 MG tablet Commonly known as:  LOPRESSOR Take 0.5 tablets (12.5 mg total) by mouth 2 (two) times daily.   nitroGLYCERIN 0.4 MG SL tablet Commonly known as:  NITROSTAT Place 1 tablet (0.4 mg total) under the tongue every 5 (five) minutes as needed for chest pain.   ondansetron 8 MG disintegrating tablet Commonly known as:  ZOFRAN ODT Take 1 tablet (8 mg total) by mouth every 8 (eight) hours as needed for nausea or vomiting.   pantoprazole 40 MG tablet Commonly known as:  PROTONIX Take 1 tablet (40 mg total) by mouth daily at 6 (six) AM.   PRALUENT 75 MG/ML Sopn Generic drug:  Alirocumab Inject 1 pen into the skin every 14 (fourteen) days. STUDY MEDICATION   promethazine 12.5 MG tablet Commonly known as:  PHENERGAN Take 1 tablet (12.5 mg total) by mouth every 6 (six) hours.   Vitamin D3 50000 units Caps Take 1 capsule by mouth 2 (two) times a week.  Sunday AND Wednesday       Allergies:  Allergies  Allergen Reactions  . Other Swelling    Smoke--Head swelling  . Statins Swelling  . Metoclopramide Other (See Comments)    Causes tremors and insomnia  . Amoxicillin   . Codeine   . Sulfa Antibiotics   . Duloxetine Nausea Only    Family History: Family History  Problem Relation Age of Onset  . Diabetes Mother   . Heart failure Mother   . Prostate cancer Neg Hx   . Kidney cancer Neg Hx   . Bladder Cancer Neg Hx   .  Colon cancer Neg Hx   . Stomach cancer Neg Hx     Social History:  reports that she has never smoked. She has never used smokeless tobacco. She reports that she drinks alcohol. She reports that she does not use drugs.  ROS:                                        Physical Exam: There were no vitals taken for this visit.  Constitutional: Well nourished. Alert and oriented, No acute distress. HEENT: [place] AT, moist mucus membranes. Trachea midline, no masses. Cardiovascular: No clubbing, cyanosis, or edema. Respiratory: Normal respiratory effort, no increased work of breathing. GI: Abdomen is soft, non tender, non distended, no abdominal masses. Liver and spleen not palpable.  No hernias appreciated.  Stool sample for occult testing is not indicated.   GU: No CVA tenderness.  No bladder fullness or masses.  Patient with circumcised/uncircumcised phallus. ***Foreskin easily retracted***  Urethral meatus is patent.  No penile discharge. No penile lesions or rashes. Scrotum without lesions, cysts, rashes and/or edema.  Testicles are located scrotally bilaterally. No masses are appreciated in the testicles. Left and right epididymis are normal. Rectal: Patient with  normal sphincter tone. Anus and perineum without scarring or rashes. No rectal masses are appreciated. Prostate is approximately *** grams, *** nodules are appreciated. Seminal vesicles are normal. Skin: No rashes, bruises or  suspicious lesions. Lymph: No cervical or inguinal adenopathy. Neurologic: Grossly intact, no focal deficits, moving all 4 extremities. Psychiatric: Normal mood and affect.  Laboratory Data: Lab Results  Component Value Date   WBC 9.7 10/25/2016   HGB 11.3 (L) 10/25/2016   HCT 35.6 (L) 10/25/2016   MCV 84.0 10/25/2016   PLT 184 10/25/2016    Lab Results  Component Value Date   CREATININE 1.25 (H) 10/25/2016     Lab Results  Component Value Date   HGBA1C 7.9 (H) 09/18/2016    Pertinent Imaging: CLINICAL DATA:  Patient awoke with CP radiating between shoulder blade. Nausea, no vomiting, LUQ pain which has now moved to RUQ. H/O kidney stone 80 cc Isovue 300 (GFRe=40) Surgeries: Part Hysterectomy; Lap band surgery^  EXAM: CT ABDOMEN AND PELVIS WITH CONTRAST  TECHNIQUE: Multidetector CT imaging of the abdomen and pelvis was performed using the standard protocol following bolus administration of intravenous contrast.  CONTRAST:  64m ISOVUE-300 IOPAMIDOL (ISOVUE-300) INJECTION 61%  COMPARISON:  03/28/2016  FINDINGS: Lower chest: Scattered coronary calcifications. No pleural or pericardial effusion. Visualized lung bases clear. Previous median sternotomy.  Hepatobiliary: Stable subcentimeter probable cysts in hepatic segment 5. No new hepatic lesion. Gallbladder is incompletely distended without calcified stones. No biliary dilatation.  Pancreas: Unremarkable. No pancreatic ductal dilatation or surrounding inflammatory changes.  Spleen: Normal in size without focal abnormality.  Adrenals/Urinary Tract: Adrenal glands are unremarkable. Kidneys are normal, without renal calculi, focal lesion, or hydronephrosis. Gas bubble in the nondilated urinary bladder presumably from recent instrumentation.  Stomach/Bowel: Gastric lap band apparatus projects in expected location, appears grossly intact. Stomach is nondilated. Duodenal diverticulum. Remainder  small bowel is decompressed. Appendix not discretely identified. Scattered diverticula most numerous in the sigmoid segment, without significant adjacent inflammatory/ edematous change.  Vascular/Lymphatic: Aortoiliac atheromatous calcifications without aneurysm. Surgical clips overlie the left common femoral vessels. Occluded stent in the proximal visualized left SFA. Patent stent in the proximal visualized right SFA.  No abdominal or pelvic adenopathy  localized.  Portal vein patent  Reproductive:  Status post hysterectomy. No adnexal masses.  Other: Trace free pelvic fluid.  No free air.  Musculoskeletal: Tiny umbilical hernia containing only mesenteric fat. Degenerative disc disease L4-S1. Negative for fracture or worrisome bone lesion.  IMPRESSION: 1. No acute findings. 2. Sigmoid diverticulosis. 3. Coronary and Aortic Atherosclerosis (ICD10-170.0) with occluded left SFA stent noted.   Electronically Signed   By: Lucrezia Europe M.D.   On: 09/12/2016 14:09  Assessment & Plan:    1. Left ureteral stone  - no stone seen on KUB  - no hydronephrosis seen on RUS   2. Left hydronephrosis  - hydronephrosis has resolved.    3. Right renal stones  - seen on RUS - not visible on CT  - follow with yearly [place] to contact our office or seek treatment in the ED if becomes febrile or pain/ vomiting are difficult control in order to arrange for emergent/urgent intervention   No follow-ups on file.  Zara Council, [facility] [address] [place],  [address] [phone]

## 2017-04-22 ENCOUNTER — Ambulatory Visit: Payer: Medicare Other | Admitting: Urology

## 2017-05-15 IMAGING — US US RENAL
1 series · 14 of 25 positions shown · non-contrast
Comparison: CT urogram March 28, 2016

CLINICAL DATA: History of left UPJ stone on CT scan March 28, 2016

EXAM:
RENAL / URINARY TRACT ULTRASOUND COMPLETE

[Series 1: us renal · 0.23mm/px · 14 of 35 slices shown]
[im 1/35]
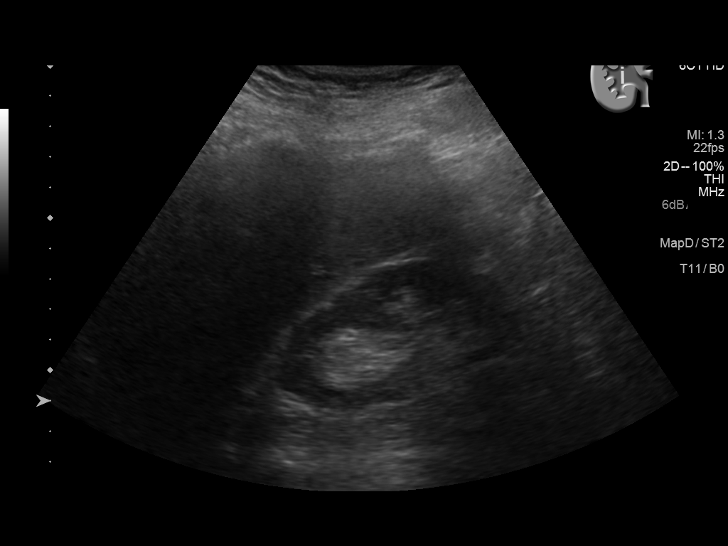
[im 3/35]
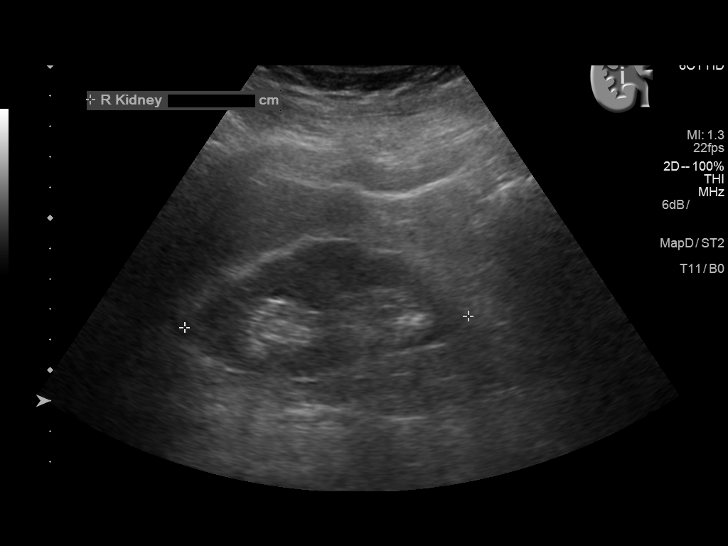
[im 6/35]
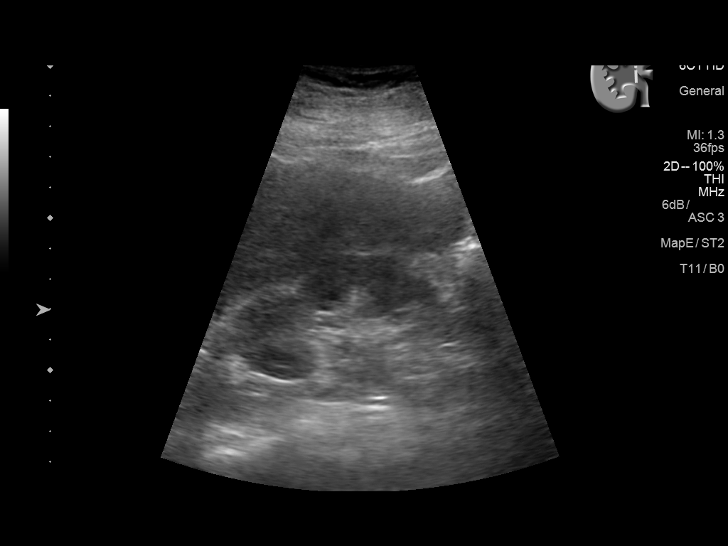
[im 9/35]
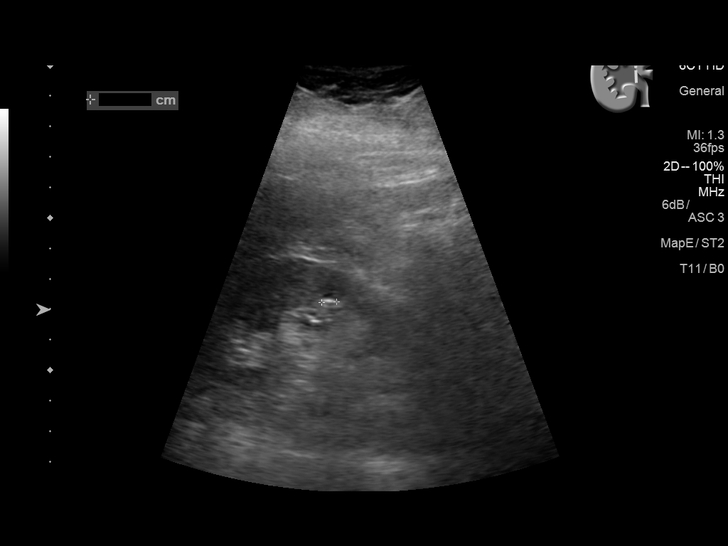
[im 12/35]
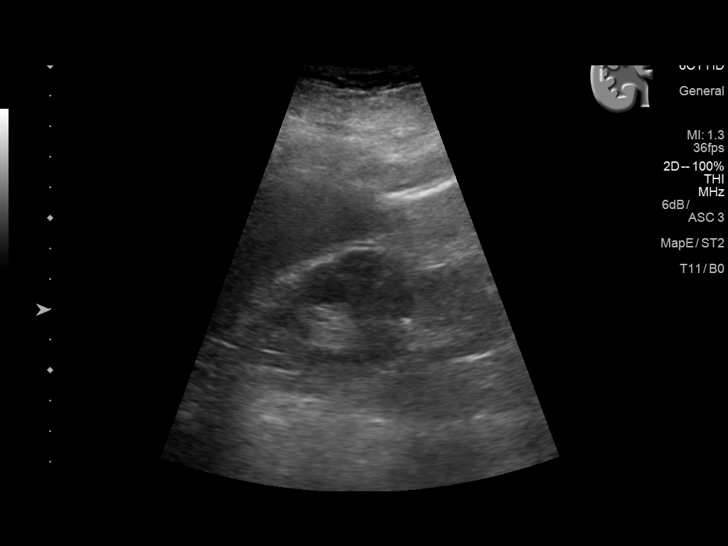
[im 13/35]
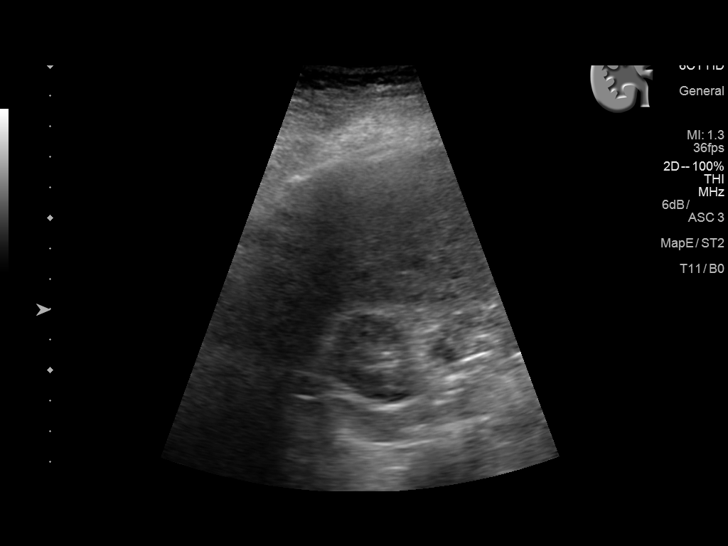
[im 16/35]
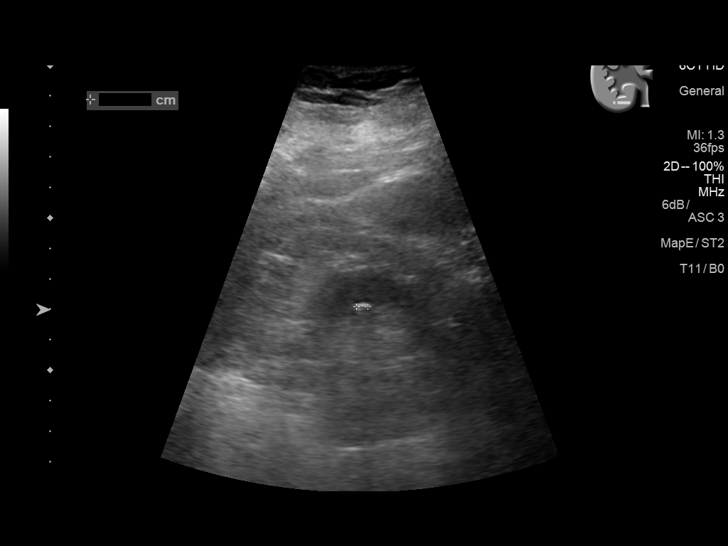
[im 19/35]
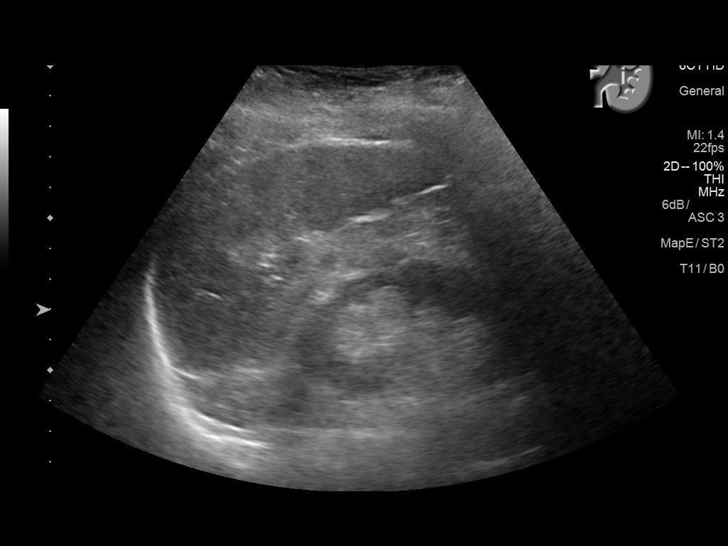
[im 22/35]
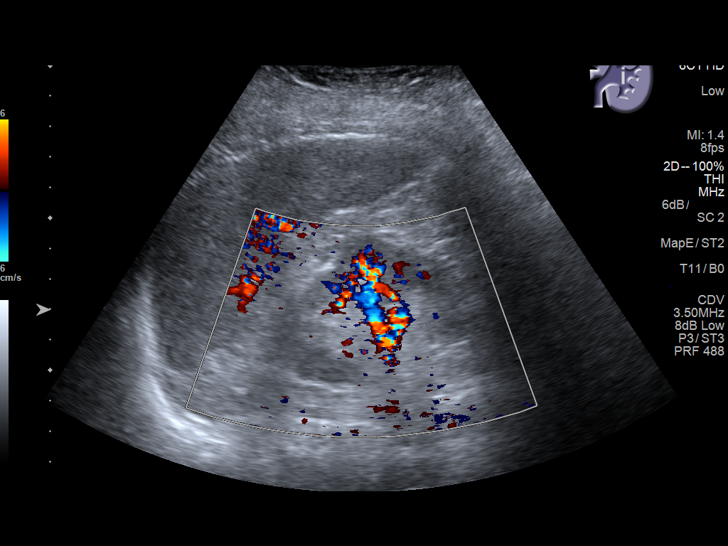
[im 23/35]
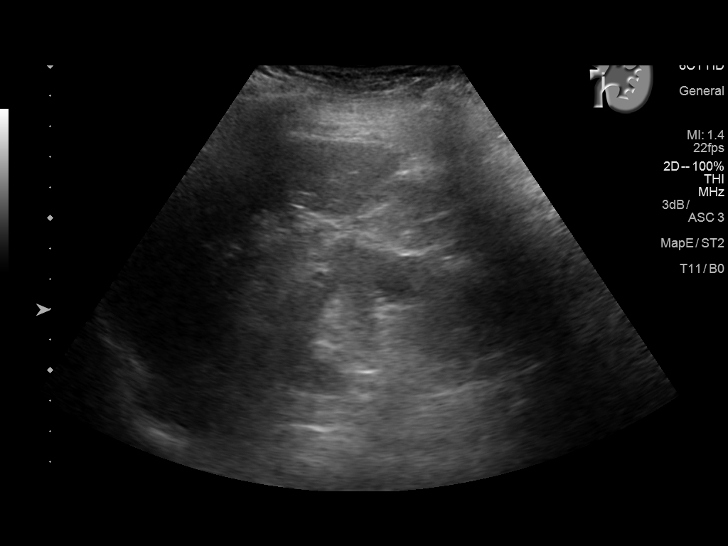
[im 26/35]
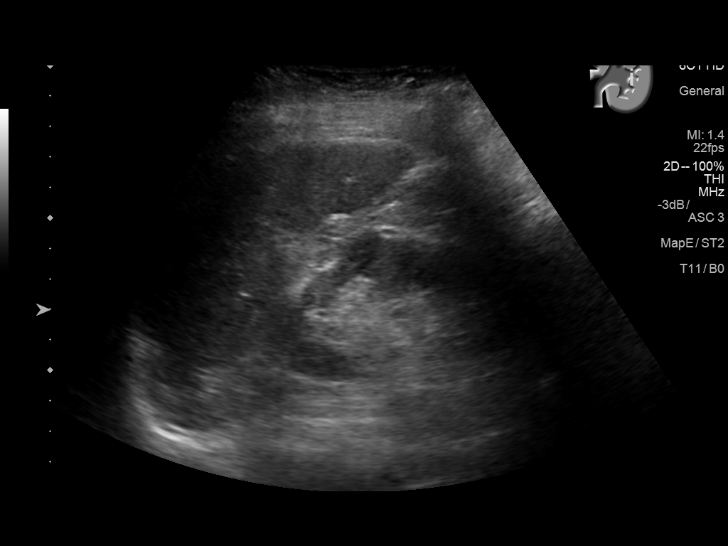
[im 29/35]
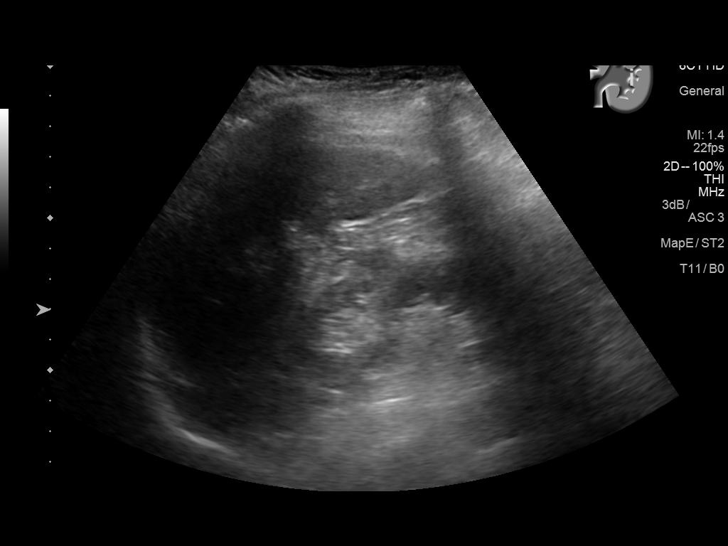
[im 32/35]
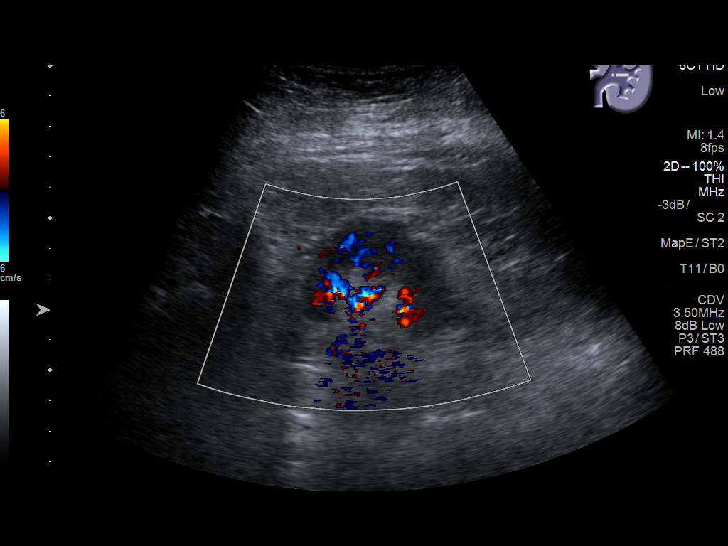
[im 35/35]
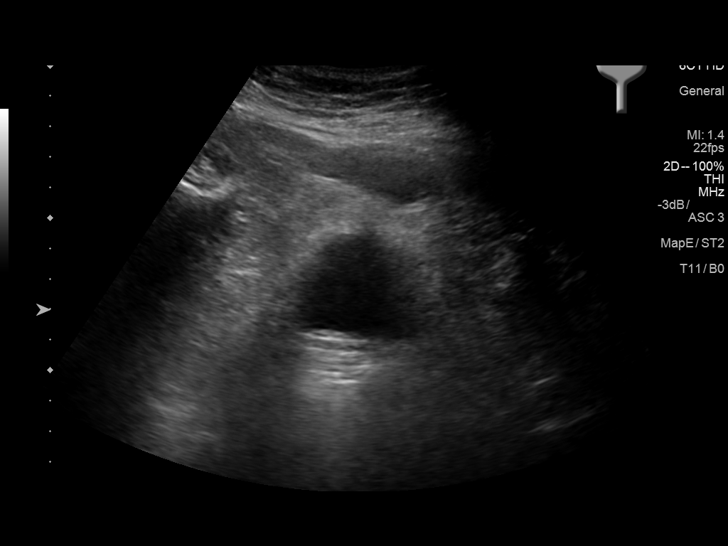

[14 of 25 positions shown; findings below may reference images not displayed]

FINDINGS: Right Kidney:

Length: 9.3 cm. There are punctate echogenic foci in the right
kidney. In the lower pole there is a 9 mm diameter nonobstructing
stone. There is no hydronephrosis. The renal cortical echotexture
remains lower than that of the liver.

Left Kidney:

Length: 8.2 cm.. The renal cortical echotexture similar to that of
the right kidney. No hydronephrosis or stones are observed.

Bladder:

Appears normal for degree of bladder distention.
IMPRESSION: Interval resolution of mild hydronephrosis on the left. The UPJ
stone is not visible. No other stones on the left are observed.
There are several tiny stones in the right kidney with the largest
measuring 9 mm lying in the lower pole. There is no right-sided
hydronephrosis.

## 2017-05-19 ENCOUNTER — Encounter: Payer: Self-pay | Admitting: *Deleted

## 2017-05-19 ENCOUNTER — Encounter: Payer: Medicare Other | Attending: Internal Medicine | Admitting: *Deleted

## 2017-05-19 VITALS — Ht 62.5 in | Wt 174.1 lb

## 2017-05-19 DIAGNOSIS — I252 Old myocardial infarction: Secondary | ICD-10-CM | POA: Insufficient documentation

## 2017-05-19 DIAGNOSIS — Z951 Presence of aortocoronary bypass graft: Secondary | ICD-10-CM | POA: Diagnosis not present

## 2017-05-19 DIAGNOSIS — Z7901 Long term (current) use of anticoagulants: Secondary | ICD-10-CM | POA: Insufficient documentation

## 2017-05-19 DIAGNOSIS — I110 Hypertensive heart disease with heart failure: Secondary | ICD-10-CM | POA: Diagnosis not present

## 2017-05-19 DIAGNOSIS — E1142 Type 2 diabetes mellitus with diabetic polyneuropathy: Secondary | ICD-10-CM | POA: Insufficient documentation

## 2017-05-19 DIAGNOSIS — I2510 Atherosclerotic heart disease of native coronary artery without angina pectoris: Secondary | ICD-10-CM | POA: Insufficient documentation

## 2017-05-19 DIAGNOSIS — L97521 Non-pressure chronic ulcer of other part of left foot limited to breakdown of skin: Secondary | ICD-10-CM | POA: Insufficient documentation

## 2017-05-19 DIAGNOSIS — Z880 Allergy status to penicillin: Secondary | ICD-10-CM | POA: Diagnosis not present

## 2017-05-19 DIAGNOSIS — E1151 Type 2 diabetes mellitus with diabetic peripheral angiopathy without gangrene: Secondary | ICD-10-CM | POA: Insufficient documentation

## 2017-05-19 DIAGNOSIS — C9000 Multiple myeloma not having achieved remission: Secondary | ICD-10-CM | POA: Diagnosis not present

## 2017-05-19 DIAGNOSIS — E11621 Type 2 diabetes mellitus with foot ulcer: Secondary | ICD-10-CM | POA: Diagnosis not present

## 2017-05-19 DIAGNOSIS — D649 Anemia, unspecified: Secondary | ICD-10-CM | POA: Diagnosis not present

## 2017-05-19 DIAGNOSIS — Z955 Presence of coronary angioplasty implant and graft: Secondary | ICD-10-CM

## 2017-05-19 DIAGNOSIS — I509 Heart failure, unspecified: Secondary | ICD-10-CM | POA: Diagnosis not present

## 2017-05-19 DIAGNOSIS — I214 Non-ST elevation (NSTEMI) myocardial infarction: Secondary | ICD-10-CM

## 2017-05-19 DIAGNOSIS — Z7982 Long term (current) use of aspirin: Secondary | ICD-10-CM | POA: Insufficient documentation

## 2017-05-19 NOTE — Progress Notes (Signed)
Cardiac Individual Treatment Plan  Patient Details  Name: [name] MRN: [id number] Date of Birth: December 12, 1940 Referring Provider:     Cardiac Rehab from 05/19/2017 in [facility] Cardiac and Pulmonary Rehab  Referring Provider  [facility]      Initial Encounter Date:    Cardiac Rehab from 05/19/2017 in [facility] Cardiac and Pulmonary Rehab  Date  05/19/17  Referring Provider  Chryl Heck      Visit Diagnosis: NSTEMI (non-ST elevated myocardial infarction) [facility])  Status post coronary artery stent placement  Patient's Home Medications on Admission:  Current Outpatient Medications:  .  Alirocumab (PRALUENT) 75 MG/ML SOPN, Inject 1 pen into the skin every 14 (fourteen) days. STUDY MEDICATION, Disp: 2 pen, Rfl: 11 .  aspirin 81 MG tablet, Take 81 mg by mouth daily., Disp: , Rfl:  .  Cholecalciferol (VITAMIN D3) 50000 units CAPS, Take 1 capsule by mouth 2 (two) times a week. Sunday AND Wednesday, Disp: , Rfl:  .  clopidogrel (PLAVIX) 75 MG tablet, , Disp: , Rfl: 11 .  diclofenac sodium (VOLTAREN) 1 % GEL, Apply 2 g topically daily as needed for pain., Disp: , Rfl:  .  feeding supplement, ENSURE ENLIVE, (ENSURE ENLIVE) LIQD, Take 237 mLs by mouth 2 (two) times daily between meals., Disp: 237 mL, Rfl: 12 .  fentaNYL (DURAGESIC - DOSED MCG/HR) 50 MCG/HR, Place 50 mcg onto the skin every 3 (three) days. , Disp: , Rfl:  .  fluticasone (FLONASE) 50 MCG/ACT nasal spray, Place 2 sprays into both nostrils daily as needed for allergies. , Disp: , Rfl:  .  gabapentin (NEURONTIN) 300 MG capsule, Take 300-600 mg by mouth See admin instructions. Pt takes 672m in am, 3036mat bedtime, Disp: , Rfl:  .  hydrocortisone cream 1 %, Apply topically as needed for itching., Disp: 30 g, Rfl: 0 .  lisinopril (PRINIVIL,ZESTRIL) 5 MG tablet, Take 1 tablet (5 mg total) by mouth daily., Disp: 30 tablet, Rfl: 12 .  metoprolol succinate (TOPROL-XL) 25 MG 24 hr tablet, Take by mouth., Disp: , Rfl:  .  nitroGLYCERIN (NITROSTAT) 0.4  MG SL tablet, Place 1 tablet (0.4 mg total) under the tongue every 5 (five) minutes as needed for chest pain., Disp: 25 tablet, Rfl: 2 .  ondansetron (ZOFRAN ODT) 8 MG disintegrating tablet, Take 1 tablet (8 mg total) by mouth every 8 (eight) hours as needed for nausea or vomiting., Disp: 120 tablet, Rfl: 2 .  Vitamin D, Ergocalciferol, (DRISDOL) 50000 units CAPS capsule, , Disp: , Rfl: 5 .  furosemide (LASIX) 20 MG tablet, Take 1 tablet (20 mg total) by mouth daily as needed for fluid or edema., Disp: 30 tablet, Rfl: 3 .  isosorbide mononitrate (IMDUR) 60 MG 24 hr tablet, Take 1 tablet (60 mg total) by mouth daily., Disp: 90 tablet, Rfl: 3 .  metFORMIN (GLUCOPHAGE) 500 MG tablet, Take 500 mg by mouth 2 (two) times daily with a meal. , Disp: , Rfl:  .  metoprolol tartrate (LOPRESSOR) 25 MG tablet, Take 0.5 tablets (12.5 mg total) by mouth 2 (two) times daily., Disp: 90 tablet, Rfl: 3 .  Omega-3 Fatty Acids (FISH OIL) 1000 MG CAPS, Take 2 capsules (2,000 mg total) by mouth daily. (Patient not taking: Reported on 05/19/2017), Disp: 60 capsule, Rfl: 0 .  pantoprazole (PROTONIX) 40 MG tablet, Take 1 tablet (40 mg total) by mouth daily at 6 (six) AM. (Patient not taking: Reported on 05/19/2017), Disp: 30 tablet, Rfl: 0 .  promethazine (PHENERGAN) 12.5 MG tablet,  Take 1 tablet (12.5 mg total) by mouth every 6 (six) hours. (Patient not taking: Reported on 05/19/2017), Disp: 100 tablet, Rfl: 0  Current Facility-Administered Medications:  .  0.9 %  sodium chloride infusion, 500 mL, Intravenous, Continuous, Milus Banister, MD  Past Medical History: Past Medical History:  Diagnosis Date  . Anginal pain ([place])   . Anxiety   . Arthritis    "shoulders primarily" (01/13/2016)  . CAD (coronary artery disease)    s/p CABG in Oct 2017  . CHF (congestive heart failure) ([place])   . Chronic pain    from her multiple myeloma but says that her pain is usually in her legs/notes 01/13/2016  . High cholesterol   . History of  blood transfusion    "numerous; related to procedures for my heart" (01/13/2016)  . Hypertension   . Ischemic cardiomyopathy    /nots 01/13/2016  . MI (myocardial infarction) ([place])    EKG on arrival 01/13/2016 showed NSR with evidence of old anterior and inferior infarcts  . MI (myocardial infarction) ([place]) 08/2015  . Multiple myeloma ([place]) dx'd 2015   off chemo since 2016  . Nausea and vomiting 05/10/2016   3-4 month hx of nausea and vomiting  . PAD (peripheral artery disease) ([place])   . Stroke [facility])    "several in 1 year; ? year" (01/13/2016)  . Type II diabetes mellitus (HCC)     Tobacco Use: Social History   Tobacco Use  Smoking Status Never Smoker  Smokeless Tobacco Never Used    Labs: Recent Review Flowsheet Data    Labs for ITP Cardiac and Pulmonary Rehab Latest Ref Rng & Units 03/05/2010 01/14/2016 05/04/2016 06/03/2016 09/18/2016   Cholestrol 100 - 199 mg/dL - 207(H) 162 127 -   LDLCALC 0 - 99 mg/dL - 133(H) 68 49 -   HDL >39 mg/dL - 31(L) 46 55 -   Trlycerides 0 - 149 mg/dL - 214(H) 241(H) 117 -   Hemoglobin A1c 4.8 - 5.6 % - 7.6(H) - - 7.9(H)   TCO2 0 - 100 mmol/L 22 - - - -       Exercise Target Goals: Date: 05/19/17  Exercise Program Goal: Individual exercise prescription set using results from initial 6 min walk test and THRR while considering  patient's activity barriers and safety.   Exercise Prescription Goal: Initial exercise prescription builds to 30-45 minutes a day of aerobic activity, 2-3 days per week.  Home exercise guidelines will be given to patient during program as part of exercise prescription that the participant will acknowledge.  Activity Barriers & Risk Stratification: Activity Barriers & Cardiac Risk Stratification - 05/19/17 1418      Activity Barriers & Cardiac Risk Stratification   Activity Barriers  Arthritis;Shortness of Breath;Joint Problems    Cardiac Risk Stratification  Moderate       6 Minute Walk: 6 Minute Walk    Row Name  05/19/17 1342         6 Minute Walk   Distance  733 feet     Walk Time  5.5 minutes     # of Rest Breaks  1     MPH  1.4     METS  1.33     RPE  10     Perceived Dyspnea   3     VO2 Peak  4.67     Symptoms  Yes (comment)     Comments  Short of breath - 3 on SOB scale  Resting HR  57 bpm     Resting BP  122/54     Resting Oxygen Saturation   98 %     Exercise Oxygen Saturation  during 6 min walk  94 %     Max Ex. HR  94 bpm     Max Ex. BP  136/62     2 Minute Post BP  122/56        Oxygen Initial Assessment:   Oxygen Re-Evaluation:   Oxygen Discharge (Final Oxygen Re-Evaluation):   Initial Exercise Prescription: Initial Exercise Prescription - 05/19/17 1300      Date of Initial Exercise RX and Referring Provider   Date  05/19/17    Referring Provider  [facility]      NuStep   Level  1    SPM  80    Minutes  15    METs  1.3      Arm Ergometer   Level  1    Minutes  15    METs  1.3      Track   Laps  11    Minutes  15    METs  1.5      Prescription Details   Frequency (times per week)  2    Duration  Progress to 30 minutes of continuous aerobic without signs/symptoms of physical distress      Intensity   THRR 40-80% of Max Heartrate  92-127    Ratings of Perceived Exertion  11-13    Perceived Dyspnea  0-4      Resistance Training   Training Prescription  Yes    Weight  2 lb    Reps  10-15       Perform Capillary Blood Glucose checks as needed.  Exercise Prescription Changes: Exercise Prescription Changes    Row Name 05/19/17 1300             Response to Exercise   Blood Pressure (Admit)  122/54       Blood Pressure (Exercise)  136/62       Blood Pressure (Exit)  122/56       Heart Rate (Admit)  58 bpm       Heart Rate (Exercise)  94 bpm       Heart Rate (Exit)  84 bpm       Oxygen Saturation (Admit)  98 %       Oxygen Saturation (Exercise)  98 %       Rating of Perceived Exertion (Exercise)  10          Exercise  Comments:   Exercise Goals and Review: Exercise Goals    Row Name 05/19/17 1342             Exercise Goals   Increase Physical Activity  Yes       Intervention  Provide advice, education, support and counseling about physical activity/exercise needs.;Develop an individualized exercise prescription for aerobic and resistive training based on initial evaluation findings, risk stratification, comorbidities and participant's personal goals.       Expected Outcomes  Short Term: Attend rehab on a regular basis to increase amount of physical activity.;Long Term: Add in home exercise to make exercise part of routine and to increase amount of physical activity.;Long Term: Exercising regularly at least 3-5 days a week.       Increase Strength and Stamina  Yes       Intervention  Provide advice, education, support and counseling about physical activity/exercise  needs.;Develop an individualized exercise prescription for aerobic and resistive training based on initial evaluation findings, risk stratification, comorbidities and participant's personal goals.       Expected Outcomes  Short Term: Increase workloads from initial exercise prescription for resistance, speed, and METs.;Short Term: Perform resistance training exercises routinely during rehab and add in resistance training at home;Long Term: Improve cardiorespiratory fitness, muscular endurance and strength as measured by increased METs and functional capacity (6MWT)       Able to understand and use rate of perceived exertion (RPE) scale  Yes       Intervention  Provide education and explanation on how to use RPE scale       Expected Outcomes  Short Term: Able to use RPE daily in rehab to express subjective intensity level;Long Term:  Able to use RPE to guide intensity level when exercising independently       Able to understand and use Dyspnea scale  Yes       Intervention  Provide education and explanation on how to use Dyspnea scale        Expected Outcomes  Short Term: Able to use Dyspnea scale daily in rehab to express subjective sense of shortness of breath during exertion;Long Term: Able to use Dyspnea scale to guide intensity level when exercising independently       Knowledge and understanding of Target Heart Rate Range (THRR)  Yes       Intervention  Provide education and explanation of THRR including how the numbers were predicted and where they are located for reference       Expected Outcomes  Short Term: Able to state/look up THRR;Short Term: Able to use daily as guideline for intensity in rehab;Long Term: Able to use THRR to govern intensity when exercising independently       Able to check pulse independently  Yes       Intervention  Provide education and demonstration on how to check pulse in carotid and radial arteries.;Review the importance of being able to check your own pulse for safety during independent exercise       Expected Outcomes  Short Term: Able to explain why pulse checking is important during independent exercise;Long Term: Able to check pulse independently and accurately       Understanding of Exercise Prescription  Yes       Intervention  Provide education, explanation, and written materials on patient's individual exercise prescription       Expected Outcomes  Short Term: Able to explain program exercise prescription;Long Term: Able to explain home exercise prescription to exercise independently          Exercise Goals Re-Evaluation :   Discharge Exercise Prescription (Final Exercise Prescription Changes): Exercise Prescription Changes - 05/19/17 1300      Response to Exercise   Blood Pressure (Admit)  122/54    Blood Pressure (Exercise)  136/62    Blood Pressure (Exit)  122/56    Heart Rate (Admit)  58 bpm    Heart Rate (Exercise)  94 bpm    Heart Rate (Exit)  84 bpm    Oxygen Saturation (Admit)  98 %    Oxygen Saturation (Exercise)  98 %    Rating of Perceived Exertion (Exercise)  10        Nutrition:  Target Goals: Understanding of nutrition guidelines, daily intake of sodium <1511m, cholesterol <201m calories 30% from fat and 7% or less from saturated fats, daily to have 5 or more servings of  fruits and vegetables.  Biometrics: Pre Biometrics - 05/19/17 1340      Pre Biometrics   Height  5' 2.5" (1.588 m)    Weight  174 lb 1.6 oz (79 kg)    Waist Circumference  37 inches    Hip Circumference  48 inches    Waist to Hip Ratio  0.77 %    BMI (Calculated)  31.32    Single Leg Stand  1.5 seconds        Nutrition Therapy Plan and Nutrition Goals: Nutrition Therapy & Goals - 05/19/17 1402      Intervention Plan   Intervention  Prescribe, educate and counsel regarding individualized specific dietary modifications aiming towards targeted core components such as weight, hypertension, lipid management, diabetes, heart failure and other comorbidities.;Nutrition handout(s) given to patient.    Expected Outcomes  Short Term Goal: Understand basic principles of dietary content, such as calories, fat, sodium, cholesterol and nutrients.;Short Term Goal: A plan has been developed with personal nutrition goals set during dietitian appointment.;Long Term Goal: Adherence to prescribed nutrition plan.       Nutrition Assessments: Nutrition Assessments - 05/19/17 1402      MEDFICTS Scores   Pre Score  15       Nutrition Goals Re-Evaluation:   Nutrition Goals Discharge (Final Nutrition Goals Re-Evaluation):   Psychosocial: Target Goals: Acknowledge presence or absence of significant depression and/or stress, maximize coping skills, provide positive support system. Participant is able to verbalize types and ability to use techniques and skills needed for reducing stress and depression.   Initial Review & Psychosocial Screening: Initial Psych Review & Screening - 05/19/17 1405      Initial Review   Current issues with  Current Sleep Concerns;Current Stress Concerns she  feels like she sleeps too much, taking afternoon naps and then going to bed early    Source of Stress Concerns  Unable to perform yard/household activities;Unable to participate in former interests or hobbies      [place]?  Yes family members      Barriers   Psychosocial barriers to participate in program  The patient should benefit from training in stress management and relaxation.;There are no identifiable barriers or psychosocial needs.      Screening Interventions   Interventions  Encouraged to exercise;Provide feedback about the scores to participant;Program counselor consult;To provide support and resources with identified psychosocial needs    Expected Outcomes  Short Term goal: Utilizing psychosocial counselor, staff and physician to assist with identification of specific Stressors or current issues interfering with healing process. Setting desired goal for each stressor or current issue identified.;Long Term Goal: Stressors or current issues are controlled or eliminated.;Short Term goal: Identification and review with participant of any Quality of Life or Depression concerns found by scoring the questionnaire.;Long Term goal: The participant improves quality of Life and PHQ9 Scores as seen by post scores and/or verbalization of changes       Quality of Life Scores:  Quality of Life - 05/19/17 1406      Quality of Life Scores   Health/Function Pre  17.04 %    Socioeconomic Pre  26 %    Psych/Spiritual Pre  27.43 %    Family Pre  24 %    GLOBAL Pre  22.02 %      Scores of 19 and below usually indicate a poorer quality of life in these areas.  A difference of  2-3 points is a  clinically meaningful difference.  A difference of 2-3 points in the total score of the Quality of Life Index has been associated with significant improvement in overall quality of life, self-image, physical symptoms, and general health in studies assessing change in quality of  life.  PHQ-9: Recent Review Flowsheet Data    Depression screen [facility] 2/9 05/19/2017 12/15/2015   Decreased Interest 0 0   Down, Depressed, Hopeless 0 0   PHQ - 2 Score 0 0   Altered sleeping 2 3    Tired, decreased energy 2 2    Change in appetite 2 0   Feeling bad or failure about yourself  0 0   Trouble concentrating 1 0   Moving slowly or fidgety/restless 1 0   Suicidal thoughts 0 0   PHQ-9 Score 8 5     Interpretation of Total Score  Total Score Depression Severity:  1-4 = Minimal depression, 5-9 = Mild depression, 10-14 = Moderate depression, 15-19 = Moderately severe depression, 20-27 = Severe depression   Psychosocial Evaluation and Intervention:   Psychosocial Re-Evaluation:   Psychosocial Discharge (Final Psychosocial Re-Evaluation):   Vocational Rehabilitation: Provide vocational rehab assistance to qualifying candidates.   Vocational Rehab Evaluation & Intervention: Vocational Rehab - 05/19/17 1415      Initial Vocational Rehab Evaluation & Intervention   Assessment shows need for Vocational Rehabilitation  No       Education: Education Goals: Education classes will be provided on a variety of topics geared toward better understanding of heart health and risk factor modification. Participant will state understanding/return demonstration of topics presented as noted by education test scores.  Learning Barriers/Preferences: Learning Barriers/Preferences - 05/19/17 1410      Learning Barriers/Preferences   Learning Barriers  None    Learning Preferences  Individual Instruction;Video       Education Topics:  AED/CPR: - Group verbal and written instruction with the use of models to demonstrate the basic use of the AED with the basic ABC's of resuscitation.   General Nutrition Guidelines/Fats and Fiber: -Group instruction provided by verbal, written material, models and posters to present the general guidelines for heart healthy nutrition. Gives an  explanation and review of dietary fats and fiber.   Controlling Sodium/Reading Food Labels: -Group verbal and written material supporting the discussion of sodium use in heart healthy nutrition. Review and explanation with models, verbal and written materials for utilization of the food label.   Exercise Physiology & General Exercise Guidelines: - Group verbal and written instruction with models to review the exercise physiology of the cardiovascular system and associated critical values. Provides general exercise guidelines with specific guidelines to those with heart or lung disease.    Aerobic Exercise & Resistance Training: - Gives group verbal and written instruction on the various components of exercise. Focuses on aerobic and resistive training programs and the benefits of this training and how to safely progress through these programs..   Flexibility, Balance, Mind/Body Relaxation: Provides group verbal/written instruction on the benefits of flexibility and balance training, including mind/body exercise modes such as yoga, pilates and tai chi.  Demonstration and skill practice provided.   Cardiac Rehab from 05/19/2017 in [facility] Cardiac and Pulmonary Rehab  Date  05/19/17  Educator  [facility]  Instruction Review Code  1- Verbalizes Understanding      Stress and Anxiety: - Provides group verbal and written instruction about the health risks of elevated stress and causes of high stress.  Discuss the correlation between heart/lung disease and  anxiety and treatment options. Review healthy ways to manage with stress and anxiety.   Depression: - Provides group verbal and written instruction on the correlation between heart/lung disease and depressed mood, treatment options, and the stigmas associated with seeking treatment.   Anatomy & Physiology of the Heart: - Group verbal and written instruction and models provide basic cardiac anatomy and physiology, with the coronary electrical and  arterial systems. Review of Valvular disease and Heart Failure   Cardiac Procedures: - Group verbal and written instruction to review commonly prescribed medications for heart disease. Reviews the medication, class of the drug, and side effects. Includes the steps to properly store meds and maintain the prescription regimen. (beta blockers and nitrates)   Cardiac Medications I: - Group verbal and written instruction to review commonly prescribed medications for heart disease. Reviews the medication, class of the drug, and side effects. Includes the steps to properly store meds and maintain the prescription regimen.   Cardiac Medications II: -Group verbal and written instruction to review commonly prescribed medications for heart disease. Reviews the medication, class of the drug, and side effects. (all other drug classes)    Go Sex-Intimacy & Heart Disease, Get SMART - Goal Setting: - Group verbal and written instruction through game format to discuss heart disease and the return to sexual intimacy. Provides group verbal and written material to discuss and apply goal setting through the application of the S.M.A.R.T. Method.   Other Matters of the Heart: - Provides group verbal, written materials and models to describe Stable Angina and Peripheral Artery. Includes description of the disease process and treatment options available to the cardiac patient.   Exercise & Equipment Safety: - Individual verbal instruction and demonstration of equipment use and safety with use of the equipment.   Cardiac Rehab from 05/19/2017 in [facility] Cardiac and Pulmonary Rehab  Date  05/19/17  Educator  [facility]  Instruction Review Code  1- Verbalizes Understanding      Infection Prevention: - Provides verbal and written material to individual with discussion of infection control including proper hand washing and proper equipment cleaning during exercise session.   Cardiac Rehab from 05/19/2017 in [facility] Cardiac and  Pulmonary Rehab  Date  05/19/17  Educator  [facility]  Instruction Review Code  1- Verbalizes Understanding      Falls Prevention: - Provides verbal and written material to individual with discussion of falls prevention and safety.   Cardiac Rehab from 05/19/2017 in [facility] Cardiac and Pulmonary Rehab  Date  05/19/17  Educator  [facility]  Instruction Review Code (retired)  1- partially meets, needs review/practice      Diabetes: - Individual verbal and written instruction to review signs/symptoms of diabetes, desired ranges of glucose level fasting, after meals and with exercise. Acknowledge that pre and post exercise glucose checks will be done for 3 sessions at entry of program.   Cardiac Rehab from 05/19/2017 in [facility] Cardiac and Pulmonary Rehab  Date  05/19/17  Educator  [facility]  Instruction Review Code  1- Verbalizes Understanding      Know Your Numbers and Risk Factors: -Group verbal and written instruction about important numbers in your health.  Discussion of what are risk factors and how they play a role in the disease process.  Review of Cholesterol, Blood Pressure, Diabetes, and BMI and the role they play in your overall health.   Sleep Hygiene: -Provides group verbal and written instruction about how sleep can affect your health.  Define sleep hygiene, discuss sleep cycles  and impact of sleep habits. Review good sleep hygiene tips.    Other: -Provides group and verbal instruction on various topics (see comments)   Knowledge Questionnaire Score: Knowledge Questionnaire Score - 05/19/17 1415      Knowledge Questionnaire Score   Pre Score  21/28 correct answers reviewed with Bethena Roys       Core Components/Risk Factors/Patient Goals at Admission: Personal Goals and Risk Factors at Admission - 05/19/17 1350      Core Components/Risk Factors/Patient Goals on Admission    Weight Management  Obesity;Yes;Weight Loss    Intervention  Weight Management: Develop a combined nutrition and exercise  program designed to reach desired caloric intake, while maintaining appropriate intake of nutrient and fiber, sodium and fats, and appropriate energy expenditure required for the weight goal.;Weight Management: Provide education and appropriate resources to help participant work on and attain dietary goals.;Weight Management/Obesity: Establish reasonable short term and long term weight goals.;Obesity: Provide education and appropriate resources to help participant work on and attain dietary goals.    Admit Weight  166 lb (75.3 kg)    Goal Weight: Short Term  162 lb (73.5 kg)    Goal Weight: Long Term  150 lb (68 kg)    Expected Outcomes  Short Term: Continue to assess and modify interventions until short term weight is achieved;Long Term: Adherence to nutrition and physical activity/exercise program aimed toward attainment of established weight goal;Weight Loss: Understanding of general recommendations for a balanced deficit meal plan, which promotes 1-2 lb weight loss per week and includes a negative energy balance of [phone] kcal/d;Understanding recommendations for meals to include 15-35% energy as protein, 25-35% energy from fat, 35-60% energy from carbohydrates, less than 237m of dietary cholesterol, 20-35 gm of total fiber daily;Understanding of distribution of calorie intake throughout the day with the consumption of 4-5 meals/snacks    Diabetes  Yes    Intervention  Provide education about signs/symptoms and action to take for hypo/hyperglycemia.;Provide education about proper nutrition, including hydration, and aerobic/resistive exercise prescription along with prescribed medications to achieve blood glucose in normal ranges: Fasting glucose 65-99 mg/dL    Expected Outcomes  Short Term: Participant verbalizes understanding of the signs/symptoms and immediate care of hyper/hypoglycemia, proper foot care and importance of medication, aerobic/resistive exercise and nutrition plan for blood glucose  control.;Long Term: Attainment of HbA1C < 7%.    Heart Failure  Yes    Intervention  Provide a combined exercise and nutrition program that is supplemented with education, support and counseling about heart failure. Directed toward relieving symptoms such as shortness of breath, decreased exercise tolerance, and extremity edema.    Expected Outcomes  Improve functional capacity of life;Short term: Attendance in program 2-3 days a week with increased exercise capacity. Reported lower sodium intake. Reported increased fruit and vegetable intake. Reports medication compliance.;Short term: Daily weights obtained and reported for increase. Utilizing diuretic protocols set by physician.;Long term: Adoption of self-care skills and reduction of barriers for early signs and symptoms recognition and intervention leading to self-care maintenance.    Hypertension  Yes    Intervention  Provide education on lifestyle modifcations including regular physical activity/exercise, weight management, moderate sodium restriction and increased consumption of fresh fruit, vegetables, and low fat dairy, alcohol moderation, and smoking cessation.;Monitor prescription use compliance.    Expected Outcomes  Short Term: Continued assessment and intervention until BP is < 140/971mHG in hypertensive participants. < 130/8021mG in hypertensive participants with diabetes, heart failure or chronic kidney disease.;Long Term: Maintenance  of blood pressure at goal levels.    Lipids  Yes    Intervention  Provide education and support for participant on nutrition & aerobic/resistive exercise along with prescribed medications to achieve LDL <4m, HDL >456m    Expected Outcomes  Short Term: Participant states understanding of desired cholesterol values and is compliant with medications prescribed. Participant is following exercise prescription and nutrition guidelines.;Long Term: Cholesterol controlled with medications as prescribed, with  individualized exercise RX and with personalized nutrition plan. Value goals: LDL < 7087mHDL > 40 mg.       Core Components/Risk Factors/Patient Goals Review:    Core Components/Risk Factors/Patient Goals at Discharge (Final Review):    ITP Comments: ITP Comments    Row Name 05/19/17 1328           ITP Comments  Med review completed. Initial ITP created. [facility]          Comments: Initial ITP

## 2017-05-19 NOTE — Progress Notes (Signed)
Daily Session Note  Patient Details  Name: [name] MRN: [id number] Date of Birth: Nov 24, 1940 Referring Provider:     Cardiac Rehab from 05/19/2017 in [facility]  Referring Provider  [facility]      Encounter Date: 05/19/2017  Check In: Session Check In - 05/19/17 1326      Check-In   Location  [facility]-Cardiac & Pulmonary Rehab    Staff Present  Renita Papa, RN Vickki Hearing, BA, ACSM CEP, Exercise Physiologist    Supervising physician immediately available to respond to emergencies  See telemetry face sheet for immediately available ER MD    Medication changes reported      No    Fall or balance concerns reported     No    Tobacco Cessation  No Change    Warm-up and Cool-down  Performed as group-led instruction    Resistance Training Performed  Yes    VAD Patient?  No      Pain Assessment   Currently in Pain?  No/denies        Exercise Prescription Changes - 05/19/17 1300      Response to Exercise   Blood Pressure (Admit)  122/54    Blood Pressure (Exercise)  136/62    Blood Pressure (Exit)  122/56    Heart Rate (Admit)  58 bpm    Heart Rate (Exercise)  94 bpm    Heart Rate (Exit)  84 bpm    Oxygen Saturation (Admit)  98 %    Oxygen Saturation (Exercise)  98 %    Rating of Perceived Exertion (Exercise)  10       Social History   Tobacco Use  Smoking Status Never Smoker  Smokeless Tobacco Never Used    Goals Met:  Proper associated with RPD/PD & O2 Sat Exercise tolerated well Personal goals reviewed No report of cardiac concerns or symptoms Strength training completed today  Goals Unmet:  Not Applicable  Comments: Med Review completed   Dr. Emily Filbert is Medical Director for [facility].

## 2017-05-19 NOTE — Patient Instructions (Signed)
Patient Instructions  Patient Details  Name: [name] MRN: [id number] Date of Birth: 26-Dec-1940 Referring Provider:  Vergia Alcon, MD  Below are your personal goals for exercise, nutrition, and risk factors. Our goal is to help you stay on track towards obtaining and maintaining these goals. We will be discussing your progress on these goals with you throughout the program.  Initial Exercise Prescription: Initial Exercise Prescription - 05/19/17 1300      Date of Initial Exercise RX and Referring Provider   Date  05/19/17    Referring Provider  Komada      NuStep   Level  1    SPM  80    Minutes  15    METs  1.3      Arm Ergometer   Level  1    Minutes  15    METs  1.3      Track   Laps  11    Minutes  15    METs  1.5      Prescription Details   Frequency (times per week)  2    Duration  Progress to 30 minutes of continuous aerobic without signs/symptoms of physical distress      Intensity   THRR 40-80% of Max Heartrate  92-127    Ratings of Perceived Exertion  11-13    Perceived Dyspnea  0-4      Resistance Training   Training Prescription  Yes    Weight  2 lb    Reps  10-15       Exercise Goals: Frequency: Be able to perform aerobic exercise two to three times per week in program working toward 2-5 days per week of home exercise.  Intensity: Work with a perceived exertion of 11 (fairly light) - 15 (hard) while following your exercise prescription.  We will make changes to your prescription with you as you progress through the program.   Duration: Be able to do 30 to 45 minutes of continuous aerobic exercise in addition to a 5 minute warm-up and a 5 minute cool-down routine.   Nutrition Goals: Your personal nutrition goals will be established when you do your nutrition analysis with the dietician.  The following are general nutrition guidelines to follow: Cholesterol < 200mg /day Sodium < 1500mg /day Fiber: Women over 50 yrs - 21 grams per  day  Personal Goals: Personal Goals and Risk Factors at Admission - 05/19/17 1350      Core Components/Risk Factors/Patient Goals on Admission    Weight Management  Obesity;Yes;Weight Loss    Intervention  Weight Management: Develop a combined nutrition and exercise program designed to reach desired caloric intake, while maintaining appropriate intake of nutrient and fiber, sodium and fats, and appropriate energy expenditure required for the weight goal.;Weight Management: Provide education and appropriate resources to help participant work on and attain dietary goals.;Weight Management/Obesity: Establish reasonable short term and long term weight goals.;Obesity: Provide education and appropriate resources to help participant work on and attain dietary goals.    Admit Weight  166 lb (75.3 kg)    Goal Weight: Short Term  162 lb (73.5 kg)    Goal Weight: Long Term  150 lb (68 kg)    Expected Outcomes  Short Term: Continue to assess and modify interventions until short term weight is achieved;Long Term: Adherence to nutrition and physical activity/exercise program aimed toward attainment of established weight goal;Weight Loss: Understanding of general recommendations for a balanced deficit meal plan, which promotes 1-2 lb  weight loss per week and includes a negative energy balance of [phone] kcal/d;Understanding recommendations for meals to include 15-35% energy as protein, 25-35% energy from fat, 35-60% energy from carbohydrates, less than 200mg  of dietary cholesterol, 20-35 gm of total fiber daily;Understanding of distribution of calorie intake throughout the day with the consumption of 4-5 meals/snacks    Diabetes  Yes    Intervention  Provide education about signs/symptoms and action to take for hypo/hyperglycemia.;Provide education about proper nutrition, including hydration, and aerobic/resistive exercise prescription along with prescribed medications to achieve blood glucose in normal ranges:  Fasting glucose 65-99 mg/dL    Expected Outcomes  Short Term: Participant verbalizes understanding of the signs/symptoms and immediate care of hyper/hypoglycemia, proper foot care and importance of medication, aerobic/resistive exercise and nutrition plan for blood glucose control.;Long Term: Attainment of HbA1C < 7%.    Heart Failure  Yes    Intervention  Provide a combined exercise and nutrition program that is supplemented with education, support and counseling about heart failure. Directed toward relieving symptoms such as shortness of breath, decreased exercise tolerance, and extremity edema.    Expected Outcomes  Improve functional capacity of life;Short term: Attendance in program 2-3 days a week with increased exercise capacity. Reported lower sodium intake. Reported increased fruit and vegetable intake. Reports medication compliance.;Short term: Daily weights obtained and reported for increase. Utilizing diuretic protocols set by physician.;Long term: Adoption of self-care skills and reduction of barriers for early signs and symptoms recognition and intervention leading to self-care maintenance.    Hypertension  Yes    Intervention  Provide education on lifestyle modifcations including regular physical activity/exercise, weight management, moderate sodium restriction and increased consumption of fresh fruit, vegetables, and low fat dairy, alcohol moderation, and smoking cessation.;Monitor prescription use compliance.    Expected Outcomes  Short Term: Continued assessment and intervention until BP is < 140/42mm HG in hypertensive participants. < 130/67mm HG in hypertensive participants with diabetes, heart failure or chronic kidney disease.;Long Term: Maintenance of blood pressure at goal levels.    Lipids  Yes    Intervention  Provide education and support for participant on nutrition & aerobic/resistive exercise along with prescribed medications to achieve LDL 70mg , HDL >40mg .    Expected  Outcomes  Short Term: Participant states understanding of desired cholesterol values and is compliant with medications prescribed. Participant is following exercise prescription and nutrition guidelines.;Long Term: Cholesterol controlled with medications as prescribed, with individualized exercise RX and with personalized nutrition plan. Value goals: LDL < 70mg , HDL > 40 mg.       Tobacco Use Initial Evaluation: Social History   Tobacco Use  Smoking Status Never Smoker  Smokeless Tobacco Never Used    Exercise Goals and Review: Exercise Goals    Row Name 05/19/17 1342             Exercise Goals   Increase Physical Activity  Yes       Intervention  Provide advice, education, support and counseling about physical activity/exercise needs.;Develop an individualized exercise prescription for aerobic and resistive training based on initial evaluation findings, risk stratification, comorbidities and participant's personal goals.       Expected Outcomes  Short Term: Attend rehab on a regular basis to increase amount of physical activity.;Long Term: Add in home exercise to make exercise part of routine and to increase amount of physical activity.;Long Term: Exercising regularly at least 3-5 days a week.       Increase Strength and Stamina  Yes  Intervention  Provide advice, education, support and counseling about physical activity/exercise needs.;Develop an individualized exercise prescription for aerobic and resistive training based on initial evaluation findings, risk stratification, comorbidities and participant's personal goals.       Expected Outcomes  Short Term: Increase workloads from initial exercise prescription for resistance, speed, and METs.;Short Term: Perform resistance training exercises routinely during rehab and add in resistance training at home;Long Term: Improve cardiorespiratory fitness, muscular endurance and strength as measured by increased METs and functional capacity  (6MWT)       Able to understand and use rate of perceived exertion (RPE) scale  Yes       Intervention  Provide education and explanation on how to use RPE scale       Expected Outcomes  Short Term: Able to use RPE daily in rehab to express subjective intensity level;Long Term:  Able to use RPE to guide intensity level when exercising independently       Able to understand and use Dyspnea scale  Yes       Intervention  Provide education and explanation on how to use Dyspnea scale       Expected Outcomes  Short Term: Able to use Dyspnea scale daily in rehab to express subjective sense of shortness of breath during exertion;Long Term: Able to use Dyspnea scale to guide intensity level when exercising independently       Knowledge and understanding of Target Heart Rate Range (THRR)  Yes       Intervention  Provide education and explanation of THRR including how the numbers were predicted and where they are located for reference       Expected Outcomes  Short Term: Able to state/look up THRR;Short Term: Able to use daily as guideline for intensity in rehab;Long Term: Able to use THRR to govern intensity when exercising independently       Able to check pulse independently  Yes       Intervention  Provide education and demonstration on how to check pulse in carotid and radial arteries.;Review the importance of being able to check your own pulse for safety during independent exercise       Expected Outcomes  Short Term: Able to explain why pulse checking is important during independent exercise;Long Term: Able to check pulse independently and accurately       Understanding of Exercise Prescription  Yes       Intervention  Provide education, explanation, and written materials on patient's individual exercise prescription       Expected Outcomes  Short Term: Able to explain program exercise prescription;Long Term: Able to explain home exercise prescription to exercise independently          Copy of goals  given to participant.

## 2017-05-23 ENCOUNTER — Telehealth: Payer: Self-pay | Admitting: *Deleted

## 2017-05-23 NOTE — Telephone Encounter (Signed)
[name] called to let us know that she would not be able to make it to class tomorrow morning as her car is in the shop. She will start on Thursday.

## 2017-05-25 ENCOUNTER — Telehealth: Payer: Self-pay | Admitting: *Deleted

## 2017-05-25 ENCOUNTER — Encounter: Payer: Self-pay | Admitting: *Deleted

## 2017-05-25 DIAGNOSIS — I214 Non-ST elevation (NSTEMI) myocardial infarction: Secondary | ICD-10-CM

## 2017-05-25 DIAGNOSIS — Z955 Presence of coronary angioplasty implant and graft: Secondary | ICD-10-CM

## 2017-05-25 NOTE — Telephone Encounter (Signed)
[name] called to leave a message about not coming to class. She was in the ED yesterday and determined to have a kidney stone.  She is on a new medication to help and has a follow up appointment with radiology on Friday. She is going to stay out until she is able to pass the stone or relieve the pain.  [name] will keep [place] up to date with what is going on with her.

## 2017-05-27 ENCOUNTER — Ambulatory Visit (INDEPENDENT_AMBULATORY_CARE_PROVIDER_SITE_OTHER): Payer: Medicare Other | Admitting: Urology

## 2017-05-27 ENCOUNTER — Encounter: Payer: Self-pay | Admitting: Urology

## 2017-05-27 VITALS — BP 137/61 | HR 74 | Ht 62.0 in | Wt 167.0 lb

## 2017-05-27 DIAGNOSIS — N200 Calculus of kidney: Secondary | ICD-10-CM

## 2017-05-27 DIAGNOSIS — N3000 Acute cystitis without hematuria: Secondary | ICD-10-CM | POA: Diagnosis not present

## 2017-05-27 LAB — URINALYSIS, COMPLETE
Bilirubin, UA: NEGATIVE
Glucose, UA: NEGATIVE
Ketones, UA: NEGATIVE
NITRITE UA: NEGATIVE
PH UA: 5.5 (ref 5.0–7.5)
Protein, UA: NEGATIVE
RBC, UA: NEGATIVE
Specific Gravity, UA: 1.025 (ref 1.005–1.030)
UUROB: 0.2 mg/dL (ref 0.2–1.0)

## 2017-05-27 LAB — MICROSCOPIC EXAMINATION
Epithelial Cells (non renal): NONE SEEN /hpf (ref 0–10)
RBC MICROSCOPIC, UA: NONE SEEN /HPF (ref 0–2)

## 2017-05-27 MED ORDER — FOSFOMYCIN TROMETHAMINE 3 G PO PACK: 3.0000 g | PACK | Freq: Once | ORAL | 0 refills | Status: AC

## 2017-05-27 MED ORDER — FOSFOMYCIN TROMETHAMINE 3 G PO PACK: 3.0000 g | PACK | Freq: Once | ORAL | Status: DC

## 2017-05-27 NOTE — Progress Notes (Signed)
In and Out Catheterization  Patient is present today for a I & O catheterization due to possible UTI. Patient was cleaned and prepped in a sterile fashion with betadine and Lidocaine 2% jelly was instilled into the urethra.  A 14FR cath was inserted no complications were noted , 28ml of urine return was noted, urine was yellow in color. A clean urine sample was collected for UA and culture. Bladder was drained  And catheter was removed with out difficulty.    Preformed by: Fonnie Jarvis, CMA

## 2017-05-27 NOTE — Progress Notes (Signed)
05/27/2017 9:25 AM   [name] 1940-06-05 [id number]  Referring provider: Lynnell Jude, MD [address] [place], [place] [address]  Chief Complaint  Patient presents with  . Nephrolithiasis    HPI: 77 year old female with personal history of nephrolithiasis and recurrent UTIs who presents today for further evaluation after being seen and evaluated in the emergency room at [facility] on 05/24/2017.  She began experiencing acute onset right lower quadrant pain with associated nausea.  There were initially concerned for appendicitis and underwent CT abdomen pelvis with contrast without evidence of appendicitis.  She was diagnosed with a 4 mm nonobstructing calculus in the right lower pole without any hydronephrosis or other GU pathology.  As part of her evaluation, she had a urinalysis and urine culture consistent with multidrug-resistant urinary tract infection.  She has not yet been treated with antibiotic which was deferred as an outpatient to urology.   UA was completely negative on 05/24/2017 other than many bacteria.  There are no white blood cells or red blood cells of significance.  Urine culture ultimately grew E. coli resistant to ampicillin, ceftriaxone, Cipro, Levaquin, nitrofurantoin.  It is susceptible to Bactrim, ertapenem and gentamicin.  She denies any frequency, urgency.  She does report that she just started having dysuria this morning but never previously.  No fevers or chills.  She has no change in her baseline of urinary symptoms.   She denies constipation or loose stool.  No vomiting but some occasional nausea.  She continues to have intermittent right lower quadrant pain which is improving.  This is unrelated to voiding.  No identifying relieving or exacerbating factors.  She does have a personal history of kidney stones and was seen a year ago with a small ureteral stone which passed spontaneously.  PMH: Past Medical History:  Diagnosis Date  .  Anginal pain ([place])   . Anxiety   . Arthritis    "shoulders primarily" (01/13/2016)  . CAD (coronary artery disease)    s/p CABG in Oct 2017  . CHF (congestive heart failure) ([place])   . Chronic pain    from her multiple myeloma but says that her pain is usually in her legs/notes 01/13/2016  . High cholesterol   . History of blood transfusion    "numerous; related to procedures for my heart" (01/13/2016)  . Hypertension   . Ischemic cardiomyopathy    /nots 01/13/2016  . MI (myocardial infarction) ([place])    EKG on arrival 01/13/2016 showed NSR with evidence of old anterior and inferior infarcts  . MI (myocardial infarction) ([place]) 08/2015  . Multiple myeloma ([place]) dx'd 2015   off chemo since 2016  . Nausea and vomiting 05/10/2016   3-4 month hx of nausea and vomiting  . PAD (peripheral artery disease) ([place])   . Stroke [facility])    "several in 1 year; ? year" (01/13/2016)  . Type II diabetes mellitus ([place])     Surgical History: Past Surgical History:  Procedure Laterality Date  . CARDIAC CATHETERIZATION N/A 01/14/2016   Procedure: Left Heart Cath and Cors/Grafts Angiography;  Surgeon: Burnell Blanks, MD;  Location: [facility];  Service: Cardiovascular;  Laterality: N/A;  . CARPAL TUNNEL RELEASE Left   . CORONARY ANGIOPLASTY WITH STENT PLACEMENT  05/2015   DES placed to the mid and distal LAD as well as OM2/notes 01/13/2016  . CORONARY ANGIOPLASTY WITH STENT PLACEMENT  ~ 2010  . CORONARY ARTERY BYPASS GRAFT  11/11/2015   CABG X2  LIMA-LAD and SVG-OM/notes 01/13/2016  . DILATION AND CURETTAGE OF UTERUS    . FEMORAL-POPLITEAL BYPASS GRAFT Left 2013   Archie Endo 01/13/2016  . LAPAROSCOPIC GASTRIC BANDING  2007  . RENAL ARTERY STENT    . VAGINAL HYSTERECTOMY      Home Medications:  Allergies as of 05/27/2017      Reactions   Other Swelling   Smoke--Head swelling   Statins Swelling   Metoclopramide Other (See Comments)   Causes tremors and insomnia   Amoxicillin    Codeine    Sulfa  Antibiotics    Duloxetine Nausea Only      Medication List        Accurate as of 05/27/17  9:25 AM. Always use your most recent med list.          aspirin 81 MG tablet Take 81 mg by mouth daily.   clopidogrel 75 MG tablet Commonly known as:  PLAVIX   diclofenac sodium 1 % Gel Commonly known as:  VOLTAREN Apply 2 g topically daily as needed for pain.   feeding supplement (ENSURE ENLIVE) Liqd Take 237 mLs by mouth 2 (two) times daily between meals.   fentaNYL 50 MCG/HR Commonly known as:  DURAGESIC - dosed mcg/hr Place 50 mcg onto the skin every 3 (three) days.   Fish Oil 1000 MG Caps Take 2 capsules (2,000 mg total) by mouth daily.   fluticasone 50 MCG/ACT nasal spray Commonly known as:  FLONASE Place 2 sprays into both nostrils daily as needed for allergies.   furosemide 20 MG tablet Commonly known as:  LASIX Take 1 tablet (20 mg total) by mouth daily as needed for fluid or edema.   gabapentin 300 MG capsule Commonly known as:  NEURONTIN Take 300-600 mg by mouth See admin instructions. Pt takes 647m in am, 3048mat bedtime   hydrocortisone cream 1 % Apply topically as needed for itching.   isosorbide mononitrate 60 MG 24 hr tablet Commonly known as:  IMDUR Take 1 tablet (60 mg total) by mouth daily.   lisinopril 5 MG tablet Commonly known as:  PRINIVIL,ZESTRIL Take 1 tablet (5 mg total) by mouth daily.   metFORMIN 500 MG tablet Commonly known as:  GLUCOPHAGE Take 500 mg by mouth 2 (two) times daily with a meal.   metoprolol succinate 25 MG 24 hr tablet Commonly known as:  TOPROL-XL Take by mouth.   metoprolol tartrate 25 MG tablet Commonly known as:  LOPRESSOR Take 0.5 tablets (12.5 mg total) by mouth 2 (two) times daily.   nitroGLYCERIN 0.4 MG SL tablet Commonly known as:  NITROSTAT Place 1 tablet (0.4 mg total) under the tongue every 5 (five) minutes as needed for chest pain.   ondansetron 8 MG disintegrating tablet Commonly known as:  ZOFRAN  ODT Take 1 tablet (8 mg total) by mouth every 8 (eight) hours as needed for nausea or vomiting.   pantoprazole 40 MG tablet Commonly known as:  PROTONIX Take 1 tablet (40 mg total) by mouth daily at 6 (six) AM.   PRALUENT 75 MG/ML Sopn Generic drug:  Alirocumab Inject 1 pen into the skin every 14 (fourteen) days. STUDY MEDICATION   promethazine 12.5 MG tablet Commonly known as:  PHENERGAN Take 1 tablet (12.5 mg total) by mouth every 6 (six) hours.   Vitamin D (Ergocalciferol) 50000 units Caps capsule Commonly known as:  DRISDOL   Vitamin D3 50000 units Caps Take 1 capsule by mouth 2 (two) times a week. Sunday AND Wednesday  Allergies:  Allergies  Allergen Reactions  . Other Swelling    Smoke--Head swelling  . Statins Swelling  . Metoclopramide Other (See Comments)    Causes tremors and insomnia  . Amoxicillin   . Codeine   . Sulfa Antibiotics   . Duloxetine Nausea Only    Family History: Family History  Problem Relation Age of Onset  . Diabetes Mother   . Heart failure Mother   . Prostate cancer Neg Hx   . Kidney cancer Neg Hx   . Bladder Cancer Neg Hx   . Colon cancer Neg Hx   . Stomach cancer Neg Hx     Social History:  reports that she has never smoked. She has never used smokeless tobacco. She reports that she drinks alcohol. She reports that she does not use drugs.  ROS: UROLOGY Frequent Urination?: Yes Hard to postpone urination?: No Burning/pain with urination?: Yes Get up at night to urinate?: Yes Leakage of urine?: Yes Urine stream starts and stops?: No Trouble starting stream?: No Do you have to strain to urinate?: No Blood in urine?: No Urinary tract infection?: No Sexually transmitted disease?: No Injury to kidneys or bladder?: No Painful intercourse?: No Weak stream?: No Currently pregnant?: No Vaginal bleeding?: No Last menstrual period?: n  Gastrointestinal Nausea?: Yes Vomiting?: No Indigestion/heartburn?: No Diarrhea?:  No Constipation?: No  Constitutional Fever: No Night sweats?: No Weight loss?: No Fatigue?: No  Skin Skin rash/lesions?: No Itching?: No  Eyes Blurred vision?: No Double vision?: No  Ears/Nose/Throat Sore throat?: No Sinus problems?: Yes  Hematologic/Lymphatic Swollen glands?: No Easy bruising?: No  Cardiovascular Leg swelling?: No Chest pain?: No  Respiratory Cough?: No Shortness of breath?: No  Endocrine Excessive thirst?: No  Musculoskeletal Back pain?: No Joint pain?: Yes  Neurological Headaches?: No Dizziness?: No  Psychologic Depression?: No Anxiety?: No  Physical Exam: BP 137/61   Pulse 74   Ht 5' 2" (1.575 m)   Wt 167 lb (75.8 kg)   BMI 30.54 kg/m   Constitutional:  Alert and oriented, No acute distress. HEENT: [place] AT, moist mucus membranes.  Trachea midline, no masses. Cardiovascular: No clubbing, cyanosis, or edema. Respiratory: Normal respiratory effort, no increased work of breathing. GI: Abdomen is soft, nontender, nondistended, no abdominal masses GU: No CVA tenderness Skin: No rashes, bruises or suspicious lesions. Neurologic: Grossly intact, no focal deficits, moving all 4 extremities. Psychiatric: Normal mood and affect.  Laboratory Data: Lab Results  Component Value Date   WBC 9.7 10/25/2016   HGB 11.3 (L) 10/25/2016   HCT 35.6 (L) 10/25/2016   MCV 84.0 10/25/2016   PLT 184 10/25/2016    Lab Results  Component Value Date   CREATININE 1.25 (H) 10/25/2016    Lab Results  Component Value Date   HGBA1C 7.9 (H) 09/18/2016    Urinalysis Results for orders placed or performed in visit on 05/27/17  Microscopic Examination  Result Value Ref Range   WBC, UA 11-30 (A) 0 - 5 /hpf   RBC, UA None seen 0 - 2 /hpf   Epithelial Cells (non renal) None seen 0 - 10 /hpf   Casts Present (A) None seen /lpf   Cast Type Hyaline casts N/A   Mucus, UA Present (A) Not Estab.   Bacteria, UA Many (A) None seen/Few  Urinalysis,  Complete  Result Value Ref Range   Specific Gravity, UA 1.025 1.005 - 1.030   pH, UA 5.5 5.0 - 7.5   Color, UA Yellow Yellow  Appearance Ur Clear Clear   Leukocytes, UA 1+ (A) Negative   Protein, UA Negative Negative/Trace   Glucose, UA Negative Negative   Ketones, UA Negative Negative   RBC, UA Negative Negative   Bilirubin, UA Negative Negative   Urobilinogen, Ur 0.2 0.2 - 1.0 mg/dL   Nitrite, UA Negative Negative   Microscopic Examination See below:     Pertinent Imaging: Result Impression  -- No acute abnormality of the abdomen or pelvis. -- Additional chronic and incidental findings as described above.  Result Narrative  EXAM: CT abdomen and pelvis with contrast DATE: 05/24/2017 1:05 PM ACCESSION: [id number] UN DICTATED: 05/24/2017 1:46 PM INTERPRETATION LOCATION: [facility]  CLINICAL INDICATION: 77 years old Female with ABDOMINAL PAIN, RIGHT LOWER QUADRANT--  COMPARISON: None available.  TECHNIQUE: A spiral CT scan was obtained with IV contrast from the lung bases to the pubic symphysis.Images were reconstructed in the axial plane. Coronal and sagittal reformatted images were also provided for further evaluation.  FINDINGS:  LINES/DEVICES: Laparoscopic gastric band in place, appropriately positioned, with tubing intact and reservoir in the subcutaneous tissues of the right upper quadrant.  LOWER CHEST: 6 mm left lower lobe pulmonary nodule (2:12). Bibasilar subsegmental atelectasis. No pleural effusion. No pericardial effusion. Scattered atherosclerotic plaque/calcifications of the coronary vasculature. Fissures as well as sequelae of prior sternotomy.  ABDOMEN/PELVIS:  HEPATOBILIARY: Subcentimeter hypoattenuating lesion within the right hepatic lobe, too small to characterize. No biliary ductal dilatation. Gallbladder is unremarkable. PANCREAS: Unremarkable. SPLEEN: Unremarkable. ADRENAL GLANDS: Unremarkable. KIDNEYS/URETERS: Bilateral extrarenal pelvises.  Small amount of bilateral perinephric fat stranding. Kidneys enhance symmetrically. No hydronephrosis. 4 mm nonobstructing calculus in lower pole the right kidney (2:66)  BLADDER: Partially distended, small amount of internal air, likely secondary to recent instrumentation.  BOWEL/PERITONEUM/RETROPERITONEUM: Duodenal diverticulum. No bowel obstruction. No acute inflammatory process. Trace amount of pelvic free fluid. Colonic diverticulosis without evidence of diverticulitis. The appendix is visualized and unremarkable. VASCULATURE: Moderate atherosclerotic calcifications of the abdominal aorta and its branch vessels, which appear normal in caliber. Unremarkable inferior vena cava. SMV, splenic vein, and portal vein are patent. Stents noted in the bilateral superficial femoral arteries. LYMPH NODES: No adenopathy REPRODUCTIVE ORGANS: Status post hysterectomy. No suspicious adnexal masses.  BONES/SOFT TISSUES: Multilevel degenerative disc disease with vacuum disc phenomenon most prominent at L4-L5 and L5-S1. No suspicious osseous lesions. Multiple subcutaneous nodules most likely represent injection granulomas. Multiple subcutaneous calcified granulomas. Tiny fat-containing umbilical hernia.   Report from CT scan performed at [facility] was reviewed personally today, images unavailable.  Assessment & Plan:    1. Kidney stones Nonobstructing right lower pole 4 mm stone Patient was under the impression that her right lower quadrant pain is related to the stone which is extremely unlikely She is not actively passing a stone which is currently nonobstructing Would recommend observation for the stone Recommend following up with PCP to work-up alternative etiologies of her right lower quadrant pain - Urinalysis, Complete - CULTURE, URINE COMPREHENSIVE  2. Acute cystitis without hematuria No available oral antibiotics to treat urine culture obtained at [facility] given patient's allergy profile as well  as resistant pattern of this particular E. coli strain Given her habitus, she was catheterized today for repeat UA/urine culture There was leukocytes present in her urinalysis today and she is developed new dysuria, as such we will go ahead and treat with fosfomycin and await repeat urine culture In the interim, if her symptoms worsen or she develops fevers chills or any other concerning signs, she will  go to the emergency room to be treated with IV antibiotics  Hollice Espy, MD  [facility] [address] [place], [place] [address] [phone]  I spent 25 min with this patient of which greater than 50% was spent in counseling and coordination of care with the patient.

## 2017-05-31 ENCOUNTER — Telehealth: Payer: Self-pay | Admitting: *Deleted

## 2017-05-31 NOTE — Telephone Encounter (Signed)
udy called yesterday 5/20 to let us know she will be out for awhile as she deals with kidney stones and possible infection.

## 2017-06-01 ENCOUNTER — Encounter: Payer: Self-pay | Admitting: *Deleted

## 2017-06-01 DIAGNOSIS — Z955 Presence of coronary angioplasty implant and graft: Secondary | ICD-10-CM

## 2017-06-01 DIAGNOSIS — I214 Non-ST elevation (NSTEMI) myocardial infarction: Secondary | ICD-10-CM

## 2017-06-01 NOTE — Progress Notes (Signed)
Cardiac Individual Treatment Plan  Patient Details  Name: [name] MRN: [id number] Date of Birth: 08/20/1940 Referring Provider:     Cardiac Rehab from 05/19/2017 in [facility] Cardiac and Pulmonary Rehab  Referring Provider  Komada      Initial Encounter Date:    Cardiac Rehab from 05/19/2017 in [facility] Cardiac and Pulmonary Rehab  Date  05/19/17  Referring Provider  Komada      Visit Diagnosis: NSTEMI (non-ST elevated myocardial infarction) (HCC)  Status post coronary artery stent placement  Patient's Home Medications on Admission:  Current Outpatient Medications:  .  Alirocumab (PRALUENT) 75 MG/ML SOPN, Inject 1 pen into the skin every 14 (fourteen) days. STUDY MEDICATION, Disp: 2 pen, Rfl: 11 .  aspirin 81 MG tablet, Take 81 mg by mouth daily., Disp: , Rfl:  .  Cholecalciferol (VITAMIN D3) 50000 units CAPS, Take 1 capsule by mouth 2 (two) times a week. Sunday AND Wednesday, Disp: , Rfl:  .  clopidogrel (PLAVIX) 75 MG tablet, , Disp: , Rfl: 11 .  diclofenac sodium (VOLTAREN) 1 % GEL, Apply 2 g topically daily as needed for pain., Disp: , Rfl:  .  feeding supplement, ENSURE ENLIVE, (ENSURE ENLIVE) LIQD, Take 237 mLs by mouth 2 (two) times daily between meals., Disp: 237 mL, Rfl: 12 .  fentaNYL (DURAGESIC - DOSED MCG/HR) 50 MCG/HR, Place 50 mcg onto the skin every 3 (three) days. , Disp: , Rfl:  .  fluticasone (FLONASE) 50 MCG/ACT nasal spray, Place 2 sprays into both nostrils daily as needed for allergies. , Disp: , Rfl:  .  furosemide (LASIX) 20 MG tablet, Take 1 tablet (20 mg total) by mouth daily as needed for fluid or edema., Disp: 30 tablet, Rfl: 3 .  gabapentin (NEURONTIN) 300 MG capsule, Take 300-600 mg by mouth See admin instructions. Pt takes 600mg in am, 300mg at bedtime, Disp: , Rfl:  .  hydrocortisone cream 1 %, Apply topically as needed for itching., Disp: 30 g, Rfl: 0 .  isosorbide mononitrate (IMDUR) 60 MG 24 hr tablet, Take 1 tablet (60 mg total) by mouth daily.,  Disp: 90 tablet, Rfl: 3 .  lisinopril (PRINIVIL,ZESTRIL) 5 MG tablet, Take 1 tablet (5 mg total) by mouth daily., Disp: 30 tablet, Rfl: 12 .  metFORMIN (GLUCOPHAGE) 500 MG tablet, Take 500 mg by mouth 2 (two) times daily with a meal. , Disp: , Rfl:  .  metoprolol succinate (TOPROL-XL) 25 MG 24 hr tablet, Take by mouth., Disp: , Rfl:  .  metoprolol tartrate (LOPRESSOR) 25 MG tablet, Take 0.5 tablets (12.5 mg total) by mouth 2 (two) times daily., Disp: 90 tablet, Rfl: 3 .  nitroGLYCERIN (NITROSTAT) 0.4 MG SL tablet, Place 1 tablet (0.4 mg total) under the tongue every 5 (five) minutes as needed for chest pain., Disp: 25 tablet, Rfl: 2 .  Omega-3 Fatty Acids (FISH OIL) 1000 MG CAPS, Take 2 capsules (2,000 mg total) by mouth daily., Disp: 60 capsule, Rfl: 0 .  ondansetron (ZOFRAN ODT) 8 MG disintegrating tablet, Take 1 tablet (8 mg total) by mouth every 8 (eight) hours as needed for nausea or vomiting., Disp: 120 tablet, Rfl: 2 .  pantoprazole (PROTONIX) 40 MG tablet, Take 1 tablet (40 mg total) by mouth daily at 6 (six) AM., Disp: 30 tablet, Rfl: 0 .  promethazine (PHENERGAN) 12.5 MG tablet, Take 1 tablet (12.5 mg total) by mouth every 6 (six) hours., Disp: 100 tablet, Rfl: 0 .  Vitamin D, Ergocalciferol, (DRISDOL) 50000 units CAPS capsule, ,   Disp: , Rfl: 5  Current Facility-Administered Medications:  .  0.9 %  sodium chloride infusion, 500 mL, Intravenous, Continuous, Jacobs, Daniel P, MD  Past Medical History: Past Medical History:  Diagnosis Date  . Anginal pain (HCC)   . Anxiety   . Arthritis    "shoulders primarily" (01/13/2016)  . CAD (coronary artery disease)    s/p CABG in Oct 2017  . CHF (congestive heart failure) (HCC)   . Chronic pain    from her multiple myeloma but says that her pain is usually in her legs/notes 01/13/2016  . High cholesterol   . History of blood transfusion    "numerous; related to procedures for my heart" (01/13/2016)  . Hypertension   . Ischemic cardiomyopathy      /nots 01/13/2016  . MI (myocardial infarction) (HCC)    EKG on arrival 01/13/2016 showed NSR with evidence of old anterior and inferior infarcts  . MI (myocardial infarction) (HCC) 08/2015  . Multiple myeloma (HCC) dx'd 2015   off chemo since 2016  . Nausea and vomiting 05/10/2016   3-4 month hx of nausea and vomiting  . PAD (peripheral artery disease) (HCC)   . Stroke (HCC)    "several in 1 year; ? year" (01/13/2016)  . Type II diabetes mellitus (HCC)     Tobacco Use: Social History   Tobacco Use  Smoking Status Never Smoker  Smokeless Tobacco Never Used    Labs: Recent Review Flowsheet Data    Labs for ITP Cardiac and Pulmonary Rehab Latest Ref Rng & Units 03/05/2010 01/14/2016 05/04/2016 06/03/2016 09/18/2016   Cholestrol 100 - 199 mg/dL - 207(H) 162 127 -   LDLCALC 0 - 99 mg/dL - 133(H) 68 49 -   HDL >39 mg/dL - 31(L) 46 55 -   Trlycerides 0 - 149 mg/dL - 214(H) 241(H) 117 -   Hemoglobin A1c 4.8 - 5.6 % - 7.6(H) - - 7.9(H)   TCO2 0 - 100 mmol/L 22 - - - -       Exercise Target Goals:    Exercise Program Goal: Individual exercise prescription set using results from initial 6 min walk test and THRR while considering  patient's activity barriers and safety.   Exercise Prescription Goal: Initial exercise prescription builds to 30-45 minutes a day of aerobic activity, 2-3 days per week.  Home exercise guidelines will be given to patient during program as part of exercise prescription that the participant will acknowledge.  Activity Barriers & Risk Stratification: Activity Barriers & Cardiac Risk Stratification - 05/19/17 1418      Activity Barriers & Cardiac Risk Stratification   Activity Barriers  Arthritis;Shortness of Breath;Joint Problems    Cardiac Risk Stratification  Moderate       6 Minute Walk: 6 Minute Walk    Row Name 05/19/17 1342         6 Minute Walk   Distance  733 feet     Walk Time  5.5 minutes     # of Rest Breaks  1     MPH  1.4     METS  1.33      RPE  10     Perceived Dyspnea   3     VO2 Peak  4.67     Symptoms  Yes (comment)     Comments  Short of breath - 3 on SOB scale     Resting HR  57 bpm     Resting BP  122/54       Resting Oxygen Saturation   98 %     Exercise Oxygen Saturation  during 6 min walk  94 %     Max Ex. HR  94 bpm     Max Ex. BP  136/62     2 Minute Post BP  122/56        Oxygen Initial Assessment:   Oxygen Re-Evaluation:   Oxygen Discharge (Final Oxygen Re-Evaluation):   Initial Exercise Prescription: Initial Exercise Prescription - 05/19/17 1300      Date of Initial Exercise RX and Referring Provider   Date  05/19/17    Referring Provider  Komada      NuStep   Level  1    SPM  80    Minutes  15    METs  1.3      Arm Ergometer   Level  1    Minutes  15    METs  1.3      Track   Laps  11    Minutes  15    METs  1.5      Prescription Details   Frequency (times per week)  2    Duration  Progress to 30 minutes of continuous aerobic without signs/symptoms of physical distress      Intensity   THRR 40-80% of Max Heartrate  92-127    Ratings of Perceived Exertion  11-13    Perceived Dyspnea  0-4      Resistance Training   Training Prescription  Yes    Weight  2 lb    Reps  10-15       Perform Capillary Blood Glucose checks as needed.  Exercise Prescription Changes: Exercise Prescription Changes    Row Name 05/19/17 1300             Response to Exercise   Blood Pressure (Admit)  122/54       Blood Pressure (Exercise)  136/62       Blood Pressure (Exit)  122/56       Heart Rate (Admit)  58 bpm       Heart Rate (Exercise)  94 bpm       Heart Rate (Exit)  84 bpm       Oxygen Saturation (Admit)  98 %       Oxygen Saturation (Exercise)  98 %       Rating of Perceived Exertion (Exercise)  10          Exercise Comments:   Exercise Goals and Review: Exercise Goals    Row Name 05/19/17 1342             Exercise Goals   Increase Physical Activity  Yes        Intervention  Provide advice, education, support and counseling about physical activity/exercise needs.;Develop an individualized exercise prescription for aerobic and resistive training based on initial evaluation findings, risk stratification, comorbidities and participant's personal goals.       Expected Outcomes  Short Term: Attend rehab on a regular basis to increase amount of physical activity.;Long Term: Add in home exercise to make exercise part of routine and to increase amount of physical activity.;Long Term: Exercising regularly at least 3-5 days a week.       Increase Strength and Stamina  Yes       Intervention  Provide advice, education, support and counseling about physical activity/exercise needs.;Develop an individualized exercise prescription for aerobic and resistive training based on initial evaluation findings, risk stratification,  comorbidities and participant's personal goals.       Expected Outcomes  Short Term: Increase workloads from initial exercise prescription for resistance, speed, and METs.;Short Term: Perform resistance training exercises routinely during rehab and add in resistance training at home;Long Term: Improve cardiorespiratory fitness, muscular endurance and strength as measured by increased METs and functional capacity (6MWT)       Able to understand and use rate of perceived exertion (RPE) scale  Yes       Intervention  Provide education and explanation on how to use RPE scale       Expected Outcomes  Short Term: Able to use RPE daily in rehab to express subjective intensity level;Long Term:  Able to use RPE to guide intensity level when exercising independently       Able to understand and use Dyspnea scale  Yes       Intervention  Provide education and explanation on how to use Dyspnea scale       Expected Outcomes  Short Term: Able to use Dyspnea scale daily in rehab to express subjective sense of shortness of breath during exertion;Long Term: Able to use  Dyspnea scale to guide intensity level when exercising independently       Knowledge and understanding of Target Heart Rate Range (THRR)  Yes       Intervention  Provide education and explanation of THRR including how the numbers were predicted and where they are located for reference       Expected Outcomes  Short Term: Able to state/look up THRR;Short Term: Able to use daily as guideline for intensity in rehab;Long Term: Able to use THRR to govern intensity when exercising independently       Able to check pulse independently  Yes       Intervention  Provide education and demonstration on how to check pulse in carotid and radial arteries.;Review the importance of being able to check your own pulse for safety during independent exercise       Expected Outcomes  Short Term: Able to explain why pulse checking is important during independent exercise;Long Term: Able to check pulse independently and accurately       Understanding of Exercise Prescription  Yes       Intervention  Provide education, explanation, and written materials on patient's individual exercise prescription       Expected Outcomes  Short Term: Able to explain program exercise prescription;Long Term: Able to explain home exercise prescription to exercise independently          Exercise Goals Re-Evaluation :   Discharge Exercise Prescription (Final Exercise Prescription Changes): Exercise Prescription Changes - 05/19/17 1300      Response to Exercise   Blood Pressure (Admit)  122/54    Blood Pressure (Exercise)  136/62    Blood Pressure (Exit)  122/56    Heart Rate (Admit)  58 bpm    Heart Rate (Exercise)  94 bpm    Heart Rate (Exit)  84 bpm    Oxygen Saturation (Admit)  98 %    Oxygen Saturation (Exercise)  98 %    Rating of Perceived Exertion (Exercise)  10       Nutrition:  Target Goals: Understanding of nutrition guidelines, daily intake of sodium <1563m, cholesterol <2060m calories 30% from fat and 7% or less  from saturated fats, daily to have 5 or more servings of fruits and vegetables.  Biometrics: Pre Biometrics - 05/19/17 1340      Pre Biometrics  Height  5' 2.5" (1.588 m)    Weight  174 lb 1.6 oz (79 kg)    Waist Circumference  37 inches    Hip Circumference  48 inches    Waist to Hip Ratio  0.77 %    BMI (Calculated)  31.32    Single Leg Stand  1.5 seconds        Nutrition Therapy Plan and Nutrition Goals: Nutrition Therapy & Goals - 05/19/17 1402      Intervention Plan   Intervention  Prescribe, educate and counsel regarding individualized specific dietary modifications aiming towards targeted core components such as weight, hypertension, lipid management, diabetes, heart failure and other comorbidities.;Nutrition handout(s) given to patient.    Expected Outcomes  Short Term Goal: Understand basic principles of dietary content, such as calories, fat, sodium, cholesterol and nutrients.;Short Term Goal: A plan has been developed with personal nutrition goals set during dietitian appointment.;Long Term Goal: Adherence to prescribed nutrition plan.       Nutrition Assessments: Nutrition Assessments - 05/19/17 1402      MEDFICTS Scores   Pre Score  15       Nutrition Goals Re-Evaluation:   Nutrition Goals Discharge (Final Nutrition Goals Re-Evaluation):   Psychosocial: Target Goals: Acknowledge presence or absence of significant depression and/or stress, maximize coping skills, provide positive support system. Participant is able to verbalize types and ability to use techniques and skills needed for reducing stress and depression.   Initial Review & Psychosocial Screening: Initial Psych Review & Screening - 05/19/17 1405      Initial Review   Current issues with  Current Sleep Concerns;Current Stress Concerns she feels like she sleeps too much, taking afternoon naps and then going to bed early    Source of Stress Concerns  Unable to perform yard/household  activities;Unable to participate in former interests or hobbies      [place]?  Yes family members      Barriers   Psychosocial barriers to participate in program  The patient should benefit from training in stress management and relaxation.;There are no identifiable barriers or psychosocial needs.      Screening Interventions   Interventions  Encouraged to exercise;Provide feedback about the scores to participant;Program counselor consult;To provide support and resources with identified psychosocial needs    Expected Outcomes  Short Term goal: Utilizing psychosocial counselor, staff and physician to assist with identification of specific Stressors or current issues interfering with healing process. Setting desired goal for each stressor or current issue identified.;Long Term Goal: Stressors or current issues are controlled or eliminated.;Short Term goal: Identification and review with participant of any Quality of Life or Depression concerns found by scoring the questionnaire.;Long Term goal: The participant improves quality of Life and PHQ9 Scores as seen by post scores and/or verbalization of changes       Quality of Life Scores:  Quality of Life - 05/19/17 1406      Quality of Life Scores   Health/Function Pre  17.04 %    Socioeconomic Pre  26 %    Psych/Spiritual Pre  27.43 %    Family Pre  24 %    GLOBAL Pre  22.02 %      Scores of 19 and below usually indicate a poorer quality of life in these areas.  A difference of  2-3 points is a clinically meaningful difference.  A difference of 2-3 points in the total score of the Quality of Life Index  has been associated with significant improvement in overall quality of life, self-image, physical symptoms, and general health in studies assessing change in quality of life.  PHQ-9: Recent Review Flowsheet Data    Depression screen [facility] 2/9 05/19/2017 12/15/2015   Decreased Interest 0 0   Down, Depressed, Hopeless 0  0   PHQ - 2 Score 0 0   Altered sleeping 2 3    Tired, decreased energy 2 2    Change in appetite 2 0   Feeling bad or failure about yourself  0 0   Trouble concentrating 1 0   Moving slowly or fidgety/restless 1 0   Suicidal thoughts 0 0   PHQ-9 Score 8 5     Interpretation of Total Score  Total Score Depression Severity:  1-4 = Minimal depression, 5-9 = Mild depression, 10-14 = Moderate depression, 15-19 = Moderately severe depression, 20-27 = Severe depression   Psychosocial Evaluation and Intervention:   Psychosocial Re-Evaluation:   Psychosocial Discharge (Final Psychosocial Re-Evaluation):   Vocational Rehabilitation: Provide vocational rehab assistance to qualifying candidates.   Vocational Rehab Evaluation & Intervention: Vocational Rehab - 05/19/17 1415      Initial Vocational Rehab Evaluation & Intervention   Assessment shows need for Vocational Rehabilitation  No       Education: Education Goals: Education classes will be provided on a variety of topics geared toward better understanding of heart health and risk factor modification. Participant will state understanding/return demonstration of topics presented as noted by education test scores.  Learning Barriers/Preferences: Learning Barriers/Preferences - 05/19/17 1410      Learning Barriers/Preferences   Learning Barriers  None    Learning Preferences  Individual Instruction;Video       Education Topics:  AED/CPR: - Group verbal and written instruction with the use of models to demonstrate the basic use of the AED with the basic ABC's of resuscitation.   General Nutrition Guidelines/Fats and Fiber: -Group instruction provided by verbal, written material, models and posters to present the general guidelines for heart healthy nutrition. Gives an explanation and review of dietary fats and fiber.   Controlling Sodium/Reading Food Labels: -Group verbal and written material supporting the discussion  of sodium use in heart healthy nutrition. Review and explanation with models, verbal and written materials for utilization of the food label.   Exercise Physiology & General Exercise Guidelines: - Group verbal and written instruction with models to review the exercise physiology of the cardiovascular system and associated critical values. Provides general exercise guidelines with specific guidelines to those with heart or lung disease.    Aerobic Exercise & Resistance Training: - Gives group verbal and written instruction on the various components of exercise. Focuses on aerobic and resistive training programs and the benefits of this training and how to safely progress through these programs..   Flexibility, Balance, Mind/Body Relaxation: Provides group verbal/written instruction on the benefits of flexibility and balance training, including mind/body exercise modes such as yoga, pilates and tai chi.  Demonstration and skill practice provided.   Cardiac Rehab from 05/19/2017 in [facility] Cardiac and Pulmonary Rehab  Date  05/19/17  Educator  [facility]  Instruction Review Code  1- Verbalizes Understanding      Stress and Anxiety: - Provides group verbal and written instruction about the health risks of elevated stress and causes of high stress.  Discuss the correlation between heart/lung disease and anxiety and treatment options. Review healthy ways to manage with stress and anxiety.   Depression: - Provides group  verbal and written instruction on the correlation between heart/lung disease and depressed mood, treatment options, and the stigmas associated with seeking treatment.   Anatomy & Physiology of the Heart: - Group verbal and written instruction and models provide basic cardiac anatomy and physiology, with the coronary electrical and arterial systems. Review of Valvular disease and Heart Failure   Cardiac Procedures: - Group verbal and written instruction to review commonly prescribed  medications for heart disease. Reviews the medication, class of the drug, and side effects. Includes the steps to properly store meds and maintain the prescription regimen. (beta blockers and nitrates)   Cardiac Medications I: - Group verbal and written instruction to review commonly prescribed medications for heart disease. Reviews the medication, class of the drug, and side effects. Includes the steps to properly store meds and maintain the prescription regimen.   Cardiac Medications II: -Group verbal and written instruction to review commonly prescribed medications for heart disease. Reviews the medication, class of the drug, and side effects. (all other drug classes)    Go Sex-Intimacy & Heart Disease, Get SMART - Goal Setting: - Group verbal and written instruction through game format to discuss heart disease and the return to sexual intimacy. Provides group verbal and written material to discuss and apply goal setting through the application of the S.M.A.R.T. Method.   Other Matters of the Heart: - Provides group verbal, written materials and models to describe Stable Angina and Peripheral Artery. Includes description of the disease process and treatment options available to the cardiac patient.   Exercise & Equipment Safety: - Individual verbal instruction and demonstration of equipment use and safety with use of the equipment.   Cardiac Rehab from 05/19/2017 in [facility] Cardiac and Pulmonary Rehab  Date  05/19/17  Educator  [facility]  Instruction Review Code  1- Verbalizes Understanding      Infection Prevention: - Provides verbal and written material to individual with discussion of infection control including proper hand washing and proper equipment cleaning during exercise session.   Cardiac Rehab from 05/19/2017 in [facility] Cardiac and Pulmonary Rehab  Date  05/19/17  Educator  [facility]  Instruction Review Code  1- Verbalizes Understanding      Falls Prevention: - Provides verbal and  written material to individual with discussion of falls prevention and safety.   Cardiac Rehab from 05/19/2017 in [facility] Cardiac and Pulmonary Rehab  Date  05/19/17  Educator  [facility]  Instruction Review Code (retired)  1- partially meets, needs review/practice      Diabetes: - Individual verbal and written instruction to review signs/symptoms of diabetes, desired ranges of glucose level fasting, after meals and with exercise. Acknowledge that pre and post exercise glucose checks will be done for 3 sessions at entry of program.   Cardiac Rehab from 05/19/2017 in [facility] Cardiac and Pulmonary Rehab  Date  05/19/17  Educator  [facility]  Instruction Review Code  1- Verbalizes Understanding      Know Your Numbers and Risk Factors: -Group verbal and written instruction about important numbers in your health.  Discussion of what are risk factors and how they play a role in the disease process.  Review of Cholesterol, Blood Pressure, Diabetes, and BMI and the role they play in your overall health.   Sleep Hygiene: -Provides group verbal and written instruction about how sleep can affect your health.  Define sleep hygiene, discuss sleep cycles and impact of sleep habits. Review good sleep hygiene tips.    Other: -Provides group and verbal instruction  on various topics (see comments)   Knowledge Questionnaire Score: Knowledge Questionnaire Score - 05/19/17 1415      Knowledge Questionnaire Score   Pre Score  21/28 correct answers reviewed with [name]       Core Components/Risk Factors/Patient Goals at Admission: Personal Goals and Risk Factors at Admission - 05/19/17 1350      Core Components/Risk Factors/Patient Goals on Admission    Weight Management  Obesity;Yes;Weight Loss    Intervention  Weight Management: Develop a combined nutrition and exercise program designed to reach desired caloric intake, while maintaining appropriate intake of nutrient and fiber, sodium and fats, and appropriate energy  expenditure required for the weight goal.;Weight Management: Provide education and appropriate resources to help participant work on and attain dietary goals.;Weight Management/Obesity: Establish reasonable short term and long term weight goals.;Obesity: Provide education and appropriate resources to help participant work on and attain dietary goals.    Admit Weight  166 lb (75.3 kg)    Goal Weight: Short Term  162 lb (73.5 kg)    Goal Weight: Long Term  150 lb (68 kg)    Expected Outcomes  Short Term: Continue to assess and modify interventions until short term weight is achieved;Long Term: Adherence to nutrition and physical activity/exercise program aimed toward attainment of established weight goal;Weight Loss: Understanding of general recommendations for a balanced deficit meal plan, which promotes 1-2 lb weight loss per week and includes a negative energy balance of [id number] kcal/d;Understanding recommendations for meals to include 15-35% energy as protein, 25-35% energy from fat, 35-60% energy from carbohydrates, less than 270m of dietary cholesterol, 20-35 gm of total fiber daily;Understanding of distribution of calorie intake throughout the day with the consumption of 4-5 meals/snacks    Diabetes  Yes    Intervention  Provide education about signs/symptoms and action to take for hypo/hyperglycemia.;Provide education about proper nutrition, including hydration, and aerobic/resistive exercise prescription along with prescribed medications to achieve blood glucose in normal ranges: Fasting glucose 65-99 mg/dL    Expected Outcomes  Short Term: Participant verbalizes understanding of the signs/symptoms and immediate care of hyper/hypoglycemia, proper foot care and importance of medication, aerobic/resistive exercise and nutrition plan for blood glucose control.;Long Term: Attainment of HbA1C < 7%.    Heart Failure  Yes    Intervention  Provide a combined exercise and nutrition program that is  supplemented with education, support and counseling about heart failure. Directed toward relieving symptoms such as shortness of breath, decreased exercise tolerance, and extremity edema.    Expected Outcomes  Improve functional capacity of life;Short term: Attendance in program 2-3 days a week with increased exercise capacity. Reported lower sodium intake. Reported increased fruit and vegetable intake. Reports medication compliance.;Short term: Daily weights obtained and reported for increase. Utilizing diuretic protocols set by physician.;Long term: Adoption of self-care skills and reduction of barriers for early signs and symptoms recognition and intervention leading to self-care maintenance.    Hypertension  Yes    Intervention  Provide education on lifestyle modifcations including regular physical activity/exercise, weight management, moderate sodium restriction and increased consumption of fresh fruit, vegetables, and low fat dairy, alcohol moderation, and smoking cessation.;Monitor prescription use compliance.    Expected Outcomes  Short Term: Continued assessment and intervention until BP is < 140/959mHG in hypertensive participants. < 130/8034mG in hypertensive participants with diabetes, heart failure or chronic kidney disease.;Long Term: Maintenance of blood pressure at goal levels.    Lipids  Yes    Intervention  Provide education  and support for participant on nutrition & aerobic/resistive exercise along with prescribed medications to achieve LDL <32m, HDL >41m    Expected Outcomes  Short Term: Participant states understanding of desired cholesterol values and is compliant with medications prescribed. Participant is following exercise prescription and nutrition guidelines.;Long Term: Cholesterol controlled with medications as prescribed, with individualized exercise RX and with personalized nutrition plan. Value goals: LDL < 7075mHDL > 40 mg.       Core Components/Risk Factors/Patient  Goals Review:    Core Components/Risk Factors/Patient Goals at Discharge (Final Review):    ITP Comments: ITP Comments    Row Name 05/19/17 1328 05/25/17 1535 06/01/17 0614       ITP Comments  Med review completed. Initial ITP created. [facility]  JudBethena Royslled to leave a message about not coming to class. She was in the ED yesterday and determined to have a kidney stone.  She is on a new medication to help and has a follow up appointment with radiology on Friday. She is going to stay out until she is able to pass the stone or relieve the pain.  JudBethena Roysll keep us [place] to date with what is going on with her.   30 day review. Continue with ITP unless directed changes per Medical Director  New to program   Med review only        Comments:

## 2017-06-02 ENCOUNTER — Ambulatory Visit: Payer: Medicare Other

## 2017-06-02 VITALS — BP 144/50 | HR 67

## 2017-06-02 DIAGNOSIS — N3000 Acute cystitis without hematuria: Secondary | ICD-10-CM

## 2017-06-02 LAB — MICROSCOPIC EXAMINATION
Epithelial Cells (non renal): NONE SEEN /hpf (ref 0–10)
RBC MICROSCOPIC, UA: NONE SEEN /HPF (ref 0–2)
WBC UA: NONE SEEN /HPF (ref 0–5)

## 2017-06-02 LAB — URINALYSIS, COMPLETE
BILIRUBIN UA: NEGATIVE
GLUCOSE, UA: NEGATIVE
Leukocytes, UA: NEGATIVE
NITRITE UA: NEGATIVE
Protein, UA: NEGATIVE
RBC, UA: NEGATIVE
UUROB: 0.2 mg/dL (ref 0.2–1.0)
pH, UA: 5 (ref 5.0–7.5)

## 2017-06-02 NOTE — Progress Notes (Signed)
In and Out Catheterization  Patient is present today for a I & O catheterization due to recheck after infection. Patient was cleaned and prepped in a sterile fashion with betadine and Lidocaine 2% jelly was instilled into the urethra.  A 14FR cath was inserted no complications were noted , 22ml of urine return was noted, urine was yellow in color. A clean urine sample was collected for UA and culture. Bladder was drained  And catheter was removed with out difficulty.    Preformed by: Fonnie Jarvis, CMA

## 2017-06-06 LAB — CULTURE, URINE COMPREHENSIVE

## 2017-06-07 DIAGNOSIS — Z951 Presence of aortocoronary bypass graft: Secondary | ICD-10-CM

## 2017-06-07 DIAGNOSIS — E11621 Type 2 diabetes mellitus with foot ulcer: Secondary | ICD-10-CM | POA: Diagnosis not present

## 2017-06-07 DIAGNOSIS — I214 Non-ST elevation (NSTEMI) myocardial infarction: Secondary | ICD-10-CM

## 2017-06-07 DIAGNOSIS — Z955 Presence of coronary angioplasty implant and graft: Secondary | ICD-10-CM

## 2017-06-07 LAB — GLUCOSE, CAPILLARY
Glucose-Capillary: 214 mg/dL — ABNORMAL HIGH (ref 65–99)
Glucose-Capillary: 178 mg/dL — ABNORMAL HIGH (ref 65–99)

## 2017-06-07 NOTE — Progress Notes (Signed)
Daily Session Note  Patient Details  Name: [name] MRN: [id number] Date of Birth: 06-14-1940 Referring Provider:     Cardiac Rehab from 05/19/2017 in [facility] Cardiac and Pulmonary Rehab  Referring Provider  Komada      Encounter Date: 06/07/2017  Check In: Session Check In - 06/07/17 0826      Check-In   Location  [facility]-Cardiac & Pulmonary Rehab    Staff Present  Heath Lark, RN, BSN, CCRP;Jessica [place], MA, RCEP, CCRP, Exercise Physiologist;Jolette Lana Oletta Darter, [place], ACSM CEP, Exercise Physiologist    Supervising physician immediately available to respond to emergencies  See telemetry face sheet for immediately available ER MD    Medication changes reported      No    Fall or balance concerns reported     No    Warm-up and Cool-down  Performed on first and last piece of equipment    Resistance Training Performed  Yes    VAD Patient?  No      Pain Assessment   Currently in Pain?  No/denies    Multiple Pain Sites  No          Social History   Tobacco Use  Smoking Status Never Smoker  Smokeless Tobacco Never Used    Goals Met:  Exercise tolerated well Personal goals reviewed No report of cardiac concerns or symptoms Strength training completed today  Goals Unmet:  Not Applicable  Comments: First full day of exercise!  Patient was oriented to gym and equipment including functions, settings, policies, and procedures.  Patient's individual exercise prescription and treatment plan were reviewed.  All starting workloads were established based on the results of the 6 minute walk test done at initial orientation visit.  The plan for exercise progression was also introduced and progression will be customized based on patient's performance and goals.    Dr. Emily Filbert is Medical Director for [facility] and [facility].

## 2017-06-08 ENCOUNTER — Telehealth: Payer: Self-pay | Admitting: Urology

## 2017-06-08 NOTE — Telephone Encounter (Signed)
Pt called office stating she has been waiting for a call about her urine results. Pt still having symptoms of pressure and frequency. Pt anxious about results due to symptoms and last specimen was "lost"  Please advise. [phone]. Thanks.

## 2017-06-09 DIAGNOSIS — Z955 Presence of coronary angioplasty implant and graft: Secondary | ICD-10-CM

## 2017-06-09 DIAGNOSIS — I214 Non-ST elevation (NSTEMI) myocardial infarction: Secondary | ICD-10-CM

## 2017-06-09 DIAGNOSIS — E11621 Type 2 diabetes mellitus with foot ulcer: Secondary | ICD-10-CM | POA: Diagnosis not present

## 2017-06-09 DIAGNOSIS — Z951 Presence of aortocoronary bypass graft: Secondary | ICD-10-CM

## 2017-06-09 LAB — GLUCOSE, CAPILLARY
GLUCOSE-CAPILLARY: 157 mg/dL — AB (ref 65–99)
Glucose-Capillary: 157 mg/dL — ABNORMAL HIGH (ref 65–99)

## 2017-06-09 NOTE — Telephone Encounter (Signed)
Patient came into the office today requesting urine culture results.  She was advised that the culture came back negative and was provided a printed copy to take to her PCP.

## 2017-06-09 NOTE — Progress Notes (Signed)
Daily Session Note  Patient Details  Name: [name] MRN: [id number] Date of Birth: Nov 06, 1940 Referring Provider:     Cardiac Rehab from 05/19/2017 in [facility] Cardiac and Pulmonary Rehab  Referring Provider  Komada      Encounter Date: 06/09/2017  Check In: Session Check In - 06/09/17 0845      Check-In   Location  [facility]-Cardiac & Pulmonary Rehab    Staff Present  Gerlene Burdock, RN, BSN;Jessica Luan Pulling, MA, RCEP, CCRP, Exercise Physiologist;Amanda Oletta Darter, [place], ACSM CEP, Exercise Physiologist    Supervising physician immediately available to respond to emergencies  See telemetry face sheet for immediately available ER MD    Medication changes reported      No    Fall or balance concerns reported     No    Warm-up and Cool-down  Performed on first and last piece of equipment    Resistance Training Performed  Yes    VAD Patient?  No      Pain Assessment   Currently in Pain?  No/denies    Multiple Pain Sites  No          Social History   Tobacco Use  Smoking Status Never Smoker  Smokeless Tobacco Never Used    Goals Met:  Independence with exercise equipment Exercise tolerated well No report of cardiac concerns or symptoms Strength training completed today  Goals Unmet:  Not Applicable  Comments: Pt able to follow exercise prescription today without complaint.  Will continue to monitor for progression.    Dr. Emily Filbert is Medical Director for [place] and [facility].

## 2017-06-14 ENCOUNTER — Encounter: Payer: Medicare Other | Attending: Internal Medicine

## 2017-06-14 DIAGNOSIS — I110 Hypertensive heart disease with heart failure: Secondary | ICD-10-CM | POA: Diagnosis not present

## 2017-06-14 DIAGNOSIS — Z951 Presence of aortocoronary bypass graft: Secondary | ICD-10-CM | POA: Insufficient documentation

## 2017-06-14 DIAGNOSIS — Z7982 Long term (current) use of aspirin: Secondary | ICD-10-CM | POA: Diagnosis not present

## 2017-06-14 DIAGNOSIS — I252 Old myocardial infarction: Secondary | ICD-10-CM | POA: Insufficient documentation

## 2017-06-14 DIAGNOSIS — E11621 Type 2 diabetes mellitus with foot ulcer: Secondary | ICD-10-CM | POA: Diagnosis not present

## 2017-06-14 DIAGNOSIS — Z880 Allergy status to penicillin: Secondary | ICD-10-CM | POA: Diagnosis not present

## 2017-06-14 DIAGNOSIS — I2510 Atherosclerotic heart disease of native coronary artery without angina pectoris: Secondary | ICD-10-CM | POA: Insufficient documentation

## 2017-06-14 DIAGNOSIS — D649 Anemia, unspecified: Secondary | ICD-10-CM | POA: Insufficient documentation

## 2017-06-14 DIAGNOSIS — E1151 Type 2 diabetes mellitus with diabetic peripheral angiopathy without gangrene: Secondary | ICD-10-CM | POA: Diagnosis not present

## 2017-06-14 DIAGNOSIS — L97521 Non-pressure chronic ulcer of other part of left foot limited to breakdown of skin: Secondary | ICD-10-CM | POA: Insufficient documentation

## 2017-06-14 DIAGNOSIS — I509 Heart failure, unspecified: Secondary | ICD-10-CM | POA: Diagnosis not present

## 2017-06-14 DIAGNOSIS — Z7901 Long term (current) use of anticoagulants: Secondary | ICD-10-CM | POA: Insufficient documentation

## 2017-06-14 DIAGNOSIS — C9000 Multiple myeloma not having achieved remission: Secondary | ICD-10-CM | POA: Insufficient documentation

## 2017-06-14 DIAGNOSIS — I214 Non-ST elevation (NSTEMI) myocardial infarction: Secondary | ICD-10-CM

## 2017-06-14 DIAGNOSIS — E1142 Type 2 diabetes mellitus with diabetic polyneuropathy: Secondary | ICD-10-CM | POA: Diagnosis not present

## 2017-06-14 LAB — GLUCOSE, CAPILLARY
GLUCOSE-CAPILLARY: 129 mg/dL — AB (ref 65–99)
GLUCOSE-CAPILLARY: 127 mg/dL — AB (ref 65–99)

## 2017-06-14 NOTE — Progress Notes (Signed)
Daily Session Note  Patient Details  Name: [name] MRN: [id number] Date of Birth: 1940/07/30 Referring Provider:     Cardiac Rehab from 05/19/2017 in [facility] Cardiac and Pulmonary Rehab  Referring Provider  Komada      Encounter Date: 06/14/2017  Check In: Session Check In - 06/14/17 0830      Check-In   Location  [facility]-Cardiac & Pulmonary Rehab    Staff Present  Justin Mend RCP,RRT,BSRT;Heath Lark, RN, BSN, Lance Sell, BA, ACSM CEP, Exercise Physiologist    Supervising physician immediately available to respond to emergencies  See telemetry face sheet for immediately available ER MD    Medication changes reported      No    Fall or balance concerns reported     No    Tobacco Cessation  No Change    Warm-up and Cool-down  Performed on first and last piece of equipment    Resistance Training Performed  Yes    VAD Patient?  No      Pain Assessment   Currently in Pain?  No/denies          Social History   Tobacco Use  Smoking Status Never Smoker  Smokeless Tobacco Never Used    Goals Met:  Independence with exercise equipment Exercise tolerated well No report of cardiac concerns or symptoms Strength training completed today  Goals Unmet:  Not Applicable  Comments: Pt able to follow exercise prescription today without complaint.  Will continue to monitor for progression.   Dr. Emily Filbert is Medical Director for [place] and [facility].

## 2017-06-16 DIAGNOSIS — E11621 Type 2 diabetes mellitus with foot ulcer: Secondary | ICD-10-CM | POA: Diagnosis not present

## 2017-06-16 DIAGNOSIS — Z951 Presence of aortocoronary bypass graft: Secondary | ICD-10-CM

## 2017-06-16 DIAGNOSIS — Z955 Presence of coronary angioplasty implant and graft: Secondary | ICD-10-CM

## 2017-06-16 DIAGNOSIS — I214 Non-ST elevation (NSTEMI) myocardial infarction: Secondary | ICD-10-CM

## 2017-06-16 NOTE — Progress Notes (Signed)
Daily Session Note  Patient Details  Name: [name] MRN: [id number] Date of Birth: August 10, 1940 Referring Provider:     Cardiac Rehab from 05/19/2017 in [facility] Cardiac and Pulmonary Rehab  Referring Provider  Komada      Encounter Date: 06/16/2017  Check In: Session Check In - 06/16/17 1024      Check-In   Location  [facility]-Cardiac & Pulmonary Rehab    Staff Present  Joellyn Rued, BS, PEC;Nana Addai, RN BSN;Susanne Bice, RN, BSN, [employer], BA, ACSM CEP, Exercise Physiologist    Supervising physician immediately available to respond to emergencies  See telemetry face sheet for immediately available ER MD    Medication changes reported      No    Fall or balance concerns reported     No    Warm-up and Cool-down  Performed on first and last piece of equipment    Resistance Training Performed  Yes    VAD Patient?  No      Pain Assessment   Currently in Pain?  No/denies    Multiple Pain Sites  No          Social History   Tobacco Use  Smoking Status Never Smoker  Smokeless Tobacco Never Used    Goals Met:  Independence with exercise equipment Exercise tolerated well No report of cardiac concerns or symptoms Strength training completed today  Goals Unmet:  Not Applicable  Comments: Pt able to follow exercise prescription today without complaint.  Will continue to monitor for progression.    Dr. Emily Filbert is Medical Director for [place] and [facility].

## 2017-06-23 DIAGNOSIS — I214 Non-ST elevation (NSTEMI) myocardial infarction: Secondary | ICD-10-CM

## 2017-06-23 DIAGNOSIS — Z951 Presence of aortocoronary bypass graft: Secondary | ICD-10-CM

## 2017-06-23 DIAGNOSIS — Z955 Presence of coronary angioplasty implant and graft: Secondary | ICD-10-CM

## 2017-06-23 DIAGNOSIS — E11621 Type 2 diabetes mellitus with foot ulcer: Secondary | ICD-10-CM | POA: Diagnosis not present

## 2017-06-23 NOTE — Progress Notes (Signed)
Daily Session Note  Patient Details  Name: [name] MRN: [id number] Date of Birth: [birth year] Referring Provider:     Cardiac Rehab from 05/19/2017 in [facility] Cardiac and Pulmonary Rehab  Referring Provider  Komada      Encounter Date: 06/23/2017  Check In: Session Check In - 06/23/17 0840      Check-In   Location  [facility]-Cardiac & Pulmonary Rehab    Staff Present  Gerlene Burdock, RN, BSN;Jessica Luan Pulling, MA, RCEP, CCRP, Exercise Physiologist;Caidyn Blossom Oletta Darter, [place], ACSM CEP, Exercise Physiologist    Supervising physician immediately available to respond to emergencies  See telemetry face sheet for immediately available ER MD    Medication changes reported      No    Fall or balance concerns reported     No    Warm-up and Cool-down  Performed on first and last piece of equipment    Resistance Training Performed  Yes    VAD Patient?  No      Pain Assessment   Currently in Pain?  No/denies    Multiple Pain Sites  No          Social History   Tobacco Use  Smoking Status Never Smoker  Smokeless Tobacco Never Used    Goals Met:  Independence with exercise equipment Exercise tolerated well Personal goals reviewed No report of cardiac concerns or symptoms Strength training completed today  Goals Unmet:  Not Applicable  Comments: Pt able to follow exercise prescription today without complaint.  Will continue to monitor for progression. See ITP for goal review   Dr. Emily Filbert is Medical Director for [place] and [facility].

## 2017-06-29 ENCOUNTER — Encounter: Payer: Self-pay | Admitting: *Deleted

## 2017-06-29 DIAGNOSIS — Z955 Presence of coronary angioplasty implant and graft: Secondary | ICD-10-CM

## 2017-06-29 DIAGNOSIS — I214 Non-ST elevation (NSTEMI) myocardial infarction: Secondary | ICD-10-CM

## 2017-06-29 NOTE — Progress Notes (Signed)
Cardiac Individual Treatment Plan  Patient Details  Name: [name] MRN: [id number] Date of Birth: Nov 16, 1940 Referring Provider:     Cardiac Rehab from 05/19/2017 in [facility] Cardiac and Pulmonary Rehab  Referring Provider  [facility]      Initial Encounter Date:    Cardiac Rehab from 05/19/2017 in [facility] Cardiac and Pulmonary Rehab  Date  05/19/17  Referring Provider  Chryl Heck      Visit Diagnosis: NSTEMI (non-ST elevated myocardial infarction) [facility])  Status post coronary artery stent placement  Patient's Home Medications on Admission:  Current Outpatient Medications:  .  Alirocumab (PRALUENT) 75 MG/ML SOPN, Inject 1 pen into the skin every 14 (fourteen) days. STUDY MEDICATION, Disp: 2 pen, Rfl: 11 .  aspirin 81 MG tablet, Take 81 mg by mouth daily., Disp: , Rfl:  .  Cholecalciferol (VITAMIN D3) 50000 units CAPS, Take 1 capsule by mouth 2 (two) times a week. Sunday AND Wednesday, Disp: , Rfl:  .  clopidogrel (PLAVIX) 75 MG tablet, , Disp: , Rfl: 11 .  diclofenac sodium (VOLTAREN) 1 % GEL, Apply 2 g topically daily as needed for pain., Disp: , Rfl:  .  feeding supplement, ENSURE ENLIVE, (ENSURE ENLIVE) LIQD, Take 237 mLs by mouth 2 (two) times daily between meals., Disp: 237 mL, Rfl: 12 .  fentaNYL (DURAGESIC - DOSED MCG/HR) 50 MCG/HR, Place 50 mcg onto the skin every 3 (three) days. , Disp: , Rfl:  .  fluticasone (FLONASE) 50 MCG/ACT nasal spray, Place 2 sprays into both nostrils daily as needed for allergies. , Disp: , Rfl:  .  furosemide (LASIX) 20 MG tablet, Take 1 tablet (20 mg total) by mouth daily as needed for fluid or edema., Disp: 30 tablet, Rfl: 3 .  gabapentin (NEURONTIN) 300 MG capsule, Take 300-600 mg by mouth See admin instructions. Pt takes 681m in am, 3060mat bedtime, Disp: , Rfl:  .  hydrocortisone cream 1 %, Apply topically as needed for itching., Disp: 30 g, Rfl: 0 .  isosorbide mononitrate (IMDUR) 60 MG 24 hr tablet, Take 1 tablet (60 mg total) by mouth daily.,  Disp: 90 tablet, Rfl: 3 .  lisinopril (PRINIVIL,ZESTRIL) 5 MG tablet, Take 1 tablet (5 mg total) by mouth daily., Disp: 30 tablet, Rfl: 12 .  metFORMIN (GLUCOPHAGE) 500 MG tablet, Take 500 mg by mouth 2 (two) times daily with a meal. , Disp: , Rfl:  .  metoprolol succinate (TOPROL-XL) 25 MG 24 hr tablet, Take by mouth., Disp: , Rfl:  .  metoprolol tartrate (LOPRESSOR) 25 MG tablet, Take 0.5 tablets (12.5 mg total) by mouth 2 (two) times daily., Disp: 90 tablet, Rfl: 3 .  nitroGLYCERIN (NITROSTAT) 0.4 MG SL tablet, Place 1 tablet (0.4 mg total) under the tongue every 5 (five) minutes as needed for chest pain., Disp: 25 tablet, Rfl: 2 .  Omega-3 Fatty Acids (FISH OIL) 1000 MG CAPS, Take 2 capsules (2,000 mg total) by mouth daily., Disp: 60 capsule, Rfl: 0 .  ondansetron (ZOFRAN ODT) 8 MG disintegrating tablet, Take 1 tablet (8 mg total) by mouth every 8 (eight) hours as needed for nausea or vomiting., Disp: 120 tablet, Rfl: 2 .  pantoprazole (PROTONIX) 40 MG tablet, Take 1 tablet (40 mg total) by mouth daily at 6 (six) AM., Disp: 30 tablet, Rfl: 0 .  promethazine (PHENERGAN) 12.5 MG tablet, Take 1 tablet (12.5 mg total) by mouth every 6 (six) hours., Disp: 100 tablet, Rfl: 0 .  Vitamin D, Ergocalciferol, (DRISDOL) 50000 units CAPS capsule, ,  Disp: , Rfl: 5  Current Facility-Administered Medications:  .  0.9 %  sodium chloride infusion, 500 mL, Intravenous, Continuous, Jacobs, Daniel P, MD  Past Medical History: Past Medical History:  Diagnosis Date  . Anginal pain (HCC)   . Anxiety   . Arthritis    "shoulders primarily" (01/13/2016)  . CAD (coronary artery disease)    s/p CABG in Oct 2017  . CHF (congestive heart failure) (HCC)   . Chronic pain    from her multiple myeloma but says that her pain is usually in her legs/notes 01/13/2016  . High cholesterol   . History of blood transfusion    "numerous; related to procedures for my heart" (01/13/2016)  . Hypertension   . Ischemic cardiomyopathy      /nots 01/13/2016  . MI (myocardial infarction) (HCC)    EKG on arrival 01/13/2016 showed NSR with evidence of old anterior and inferior infarcts  . MI (myocardial infarction) (HCC) 08/2015  . Multiple myeloma (HCC) dx'd 2015   off chemo since 2016  . Nausea and vomiting 05/10/2016   3-4 month hx of nausea and vomiting  . PAD (peripheral artery disease) (HCC)   . Stroke (HCC)    "several in 1 year; ? year" (01/13/2016)  . Type II diabetes mellitus (HCC)     Tobacco Use: Social History   Tobacco Use  Smoking Status Never Smoker  Smokeless Tobacco Never Used    Labs: Recent Review Flowsheet Data    Labs for ITP Cardiac and Pulmonary Rehab Latest Ref Rng & Units 03/05/2010 01/14/2016 05/04/2016 06/03/2016 09/18/2016   Cholestrol 100 - 199 mg/dL - 207(H) 162 127 -   LDLCALC 0 - 99 mg/dL - 133(H) 68 49 -   HDL >39 mg/dL - 31(L) 46 55 -   Trlycerides 0 - 149 mg/dL - 214(H) 241(H) 117 -   Hemoglobin A1c 4.8 - 5.6 % - 7.6(H) - - 7.9(H)   TCO2 0 - 100 mmol/L 22 - - - -       Exercise Target Goals:    Exercise Program Goal: Individual exercise prescription set using results from initial 6 min walk test and THRR while considering  patient's activity barriers and safety.   Exercise Prescription Goal: Initial exercise prescription builds to 30-45 minutes a day of aerobic activity, 2-3 days per week.  Home exercise guidelines will be given to patient during program as part of exercise prescription that the participant will acknowledge.  Activity Barriers & Risk Stratification: Activity Barriers & Cardiac Risk Stratification - 05/19/17 1418      Activity Barriers & Cardiac Risk Stratification   Activity Barriers  Arthritis;Shortness of Breath;Joint Problems    Cardiac Risk Stratification  Moderate       6 Minute Walk: 6 Minute Walk    Row Name 05/19/17 1342         6 Minute Walk   Distance  733 feet     Walk Time  5.5 minutes     # of Rest Breaks  1     MPH  1.4     METS  1.33      RPE  10     Perceived Dyspnea   3     VO2 Peak  4.67     Symptoms  Yes (comment)     Comments  Short of breath - 3 on SOB scale     Resting HR  57 bpm     Resting BP  122/54       Resting Oxygen Saturation   98 %     Exercise Oxygen Saturation  during 6 min walk  94 %     Max Ex. HR  94 bpm     Max Ex. BP  136/62     2 Minute Post BP  122/56        Oxygen Initial Assessment:   Oxygen Re-Evaluation:   Oxygen Discharge (Final Oxygen Re-Evaluation):   Initial Exercise Prescription: Initial Exercise Prescription - 05/19/17 1300      Date of Initial Exercise RX and Referring Provider   Date  05/19/17    Referring Provider  K[facility]      NuStep   Level  1    SPM  80    Minutes  15    METs  1.3      Arm Ergometer   Level  1    Minutes  15    METs  1.3      Track   Laps  11    Minutes  15    METs  1.5      Prescription Details   Frequency (times per week)  2    Duration  Progress to 30 minutes of continuous aerobic without signs/symptoms of physical distress      Intensity   THRR 40-80% of Max Heartrate  92-127    Ratings of Perceived Exertion  11-13    Perceived Dyspnea  0-4      Resistance Training   Training Prescription  Yes    Weight  2 lb    Reps  10-15       Perform Capillary Blood Glucose checks as needed.  Exercise Prescription Changes: Exercise Prescription Changes    Row Name 05/19/17 1300 06/07/17 1700 06/21/17 1500         Response to Exercise   Blood Pressure (Admit)  122/54  116/56  104/48     Blood Pressure (Exercise)  136/62  146/64  138/52     Blood Pressure (Exit)  122/56  124/60  142/54     Heart Rate (Admit)  58 bpm  60 bpm  76 bpm     Heart Rate (Exercise)  94 bpm  82 bpm  83 bpm     Heart Rate (Exit)  84 bpm  65 bpm  71 bpm     Oxygen Saturation (Admit)  98 %  -  -     Oxygen Saturation (Exercise)  98 %  -  -     Rating of Perceived Exertion (Exercise)  10  14  14     Symptoms  -  none   none     Comments  -  first full  day of exercise  -     Duration  -  Continue with 30 min of aerobic exercise without signs/symptoms of physical distress.  Continue with 30 min of aerobic exercise without signs/symptoms of physical distress.     Intensity  -  THRR unchanged  THRR unchanged       Progression   Progression  -  Continue to progress workloads to maintain intensity without signs/symptoms of physical distress.  Continue to progress workloads to maintain intensity without signs/symptoms of physical distress.     Average METs  -  1.6  2       Resistance Training   Training Prescription  -  Yes  Yes     Weight  -  2 lb  2 lbs       Reps  -  10-15  10-15       Interval Training   Interval Training  -  No  No       Recumbant Bike   Level  -  -  1     Minutes  -  -  15       NuStep   Level  -  1  -     Minutes  -  15  -     METs  -  1.6  -       Arm Ergometer   Level  -  1  -     Minutes  -  15  -       Track   Laps  -  -  22     Minutes  -  -  15     METs  -  -  2        Exercise Comments: Exercise Comments    Row Name 06/07/17 0828           Exercise Comments   First full day of exercise!  Patient was oriented to gym and equipment including functions, settings, policies, and procedures.  Patient's individual exercise prescription and treatment plan were reviewed.  All starting workloads were established based on the results of the 6 minute walk test done at initial orientation visit.  The plan for exercise progression was also introduced and progression will be customized based on patient's performance and goals.          Exercise Goals and Review: Exercise Goals    Row Name 05/19/17 1342             Exercise Goals   Increase Physical Activity  Yes       Intervention  Provide advice, education, support and counseling about physical activity/exercise needs.;Develop an individualized exercise prescription for aerobic and resistive training based on initial evaluation findings, risk  stratification, comorbidities and participant's personal goals.       Expected Outcomes  Short Term: Attend rehab on a regular basis to increase amount of physical activity.;Long Term: Add in home exercise to make exercise part of routine and to increase amount of physical activity.;Long Term: Exercising regularly at least 3-5 days a week.       Increase Strength and Stamina  Yes       Intervention  Provide advice, education, support and counseling about physical activity/exercise needs.;Develop an individualized exercise prescription for aerobic and resistive training based on initial evaluation findings, risk stratification, comorbidities and participant's personal goals.       Expected Outcomes  Short Term: Increase workloads from initial exercise prescription for resistance, speed, and METs.;Short Term: Perform resistance training exercises routinely during rehab and add in resistance training at home;Long Term: Improve cardiorespiratory fitness, muscular endurance and strength as measured by increased METs and functional capacity (6MWT)       Able to understand and use rate of perceived exertion (RPE) scale  Yes       Intervention  Provide education and explanation on how to use RPE scale       Expected Outcomes  Short Term: Able to use RPE daily in rehab to express subjective intensity level;Long Term:  Able to use RPE to guide intensity level when exercising independently       Able to understand and use Dyspnea scale  Yes       Intervention  Provide education and explanation on how to   use Dyspnea scale       Expected Outcomes  Short Term: Able to use Dyspnea scale daily in rehab to express subjective sense of shortness of breath during exertion;Long Term: Able to use Dyspnea scale to guide intensity level when exercising independently       Knowledge and understanding of Target Heart Rate Range (THRR)  Yes       Intervention  Provide education and explanation of THRR including how the numbers  were predicted and where they are located for reference       Expected Outcomes  Short Term: Able to state/look up THRR;Short Term: Able to use daily as guideline for intensity in rehab;Long Term: Able to use THRR to govern intensity when exercising independently       Able to check pulse independently  Yes       Intervention  Provide education and demonstration on how to check pulse in carotid and radial arteries.;Review the importance of being able to check your own pulse for safety during independent exercise       Expected Outcomes  Short Term: Able to explain why pulse checking is important during independent exercise;Long Term: Able to check pulse independently and accurately       Understanding of Exercise Prescription  Yes       Intervention  Provide education, explanation, and written materials on patient's individual exercise prescription       Expected Outcomes  Short Term: Able to explain program exercise prescription;Long Term: Able to explain home exercise prescription to exercise independently          Exercise Goals Re-Evaluation : Exercise Goals Re-Evaluation    Row Name 06/07/17 0828 06/21/17 1521 06/23/17 0957         Exercise Goal Re-Evaluation   Exercise Goals Review  Increase Physical Activity;Increase Strength and Stamina;Able to understand and use rate of perceived exertion (RPE) scale  Increase Physical Activity;Increase Strength and Stamina;Able to understand and use rate of perceived exertion (RPE) scale  Increase Physical Activity;Increase Strength and Stamina;Understanding of Exercise Prescription     Comments  Reviewed RPE scale, THR and program prescription with pt today.  Pt voiced understanding and was given a copy of goals to take home  [name] is off to a good start in rehab.  She is now up to 22 laps on the track.  We will continue to monitor her progression.   [name] has been doing well with exercise.  She is already starting to feel stronger and has more stamina.   She would like to continue to work on her balance.  She is currently getting some exercise at home.  She has been walking in the pool at home for 600-800 ft each day.  We will talk about home exercise guidelines soon and working her way up 30min all at once.      Expected Outcomes  Short: Use RPE daily to regulate intensity.  Long: Follow program prescription in THR.  Short: Continue to increase workloads.  Long: Continue to increase activity levels.   Short: Review home exercise guidelines.  Long: Continue to increase strength and stamina.         Discharge Exercise Prescription (Final Exercise Prescription Changes): Exercise Prescription Changes - 06/21/17 1500      Response to Exercise   Blood Pressure (Admit)  104/48    Blood Pressure (Exercise)  138/52    Blood Pressure (Exit)  142/54    Heart Rate (Admit)  76 bpm      Heart Rate (Exercise)  83 bpm    Heart Rate (Exit)  71 bpm    Rating of Perceived Exertion (Exercise)  14    Symptoms  none    Duration  Continue with 30 min of aerobic exercise without signs/symptoms of physical distress.    Intensity  THRR unchanged      Progression   Progression  Continue to progress workloads to maintain intensity without signs/symptoms of physical distress.    Average METs  2      Resistance Training   Training Prescription  Yes    Weight  2 lbs    Reps  10-15      Interval Training   Interval Training  No      Recumbant Bike   Level  1    Minutes  15      Track   Laps  22    Minutes  15    METs  2       Nutrition:  Target Goals: Understanding of nutrition guidelines, daily intake of sodium <1500mg, cholesterol <200mg, calories 30% from fat and 7% or less from saturated fats, daily to have 5 or more servings of fruits and vegetables.  Biometrics: Pre Biometrics - 05/19/17 1340      Pre Biometrics   Height  5' 2.5" (1.588 m)    Weight  174 lb 1.6 oz (79 kg)    Waist Circumference  37 inches    Hip Circumference  48 inches     Waist to Hip Ratio  0.77 %    BMI (Calculated)  31.32    Single Leg Stand  1.5 seconds        Nutrition Therapy Plan and Nutrition Goals: Nutrition Therapy & Goals - 06/16/17 1010      Nutrition Therapy   Diet  TLC    Protein (specify units)  9    Fiber  25 grams    Whole Grain Foods  3 servings    Saturated Fats  11 max. 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 She is not currently meeting her protein needs and is likely not meeting her fluid needs      Intervention Plan   Intervention  Prescribe, educate and counsel regarding individualized specific dietary modifications aiming towards targeted core components such as weight, hypertension, lipid management, diabetes, heart failure and other comorbidities.;Nutrition handout(s) given to patient. Nutrition therapy for kidney stones, Lifelong dietary management for bariatric surgery handouts provided    Expected Outcomes  Short Term Goal: Understand basic principles of dietary content, such as calories, fat, sodium, cholesterol and nutrients.;Short Term Goal: A plan has been developed with personal nutrition goals set during dietitian appointment.;Long Term Goal: Adherence to prescribed nutrition plan.       Nutrition Assessments: Nutrition Assessments - 05/19/17 1402        MEDFICTS Scores   Pre Score  15       Nutrition Goals Re-Evaluation: Nutrition Goals Re-Evaluation     Row Name 06/16/17 1030 06/23/17 0905 06/23/17 1047         Goals   Current Weight  -  -  172 lb (78 kg)     Nutrition Goal  Increase your daily fluid intake with an ultimate goal of 64oz/day, per both bariatric surgery and kidney stone guidelines  Increase your daily fluid intake with an ultimate goal of 64oz/day, per both bariatric surgery and kidney stone guidelines  Increase your daily fluid intake with an ultimate goal of 64oz/day, per both bariatric surgery and kidney stone guidelines, Boost, calcuim supplement     Comment  She tries to carry a watter bottle with her throughout the day but per her report does not meet her daily fluid goals. She has developed kidney stones, likely d/t under-hydration post-bariatric surgery  -  [name] has been doing well with her weight.  She says that she gets plenty of fluids and still eats about 6 small meals a day.  She still does not eat very much.  She is taking Boost daily to increase her calories and protein intake.  She does not eat meat and has been trying to eat more beans.  She is also taking her calcium supplement.      Expected Outcome  She will consume 64oz of fluid per day ideally - or two and a half of her typically-used water bottle  -  Short: Continue to to try to eat more protein.  Long: Continue to heart healthy.        Personal Goal #2 Re-Evaluation   Personal Goal #2  Increase intake of Boost supplements to once every other day at minimum, once daily ideally to increase your protein intake  -  -       Personal Goal #3 Re-Evaluation   Personal Goal #3  Start taking a calcium supplement daily  -  -        Nutrition Goals Discharge (Final Nutrition Goals Re-Evaluation): Nutrition Goals Re-Evaluation - 06/23/17 1047      Goals   Current Weight  172 lb (78 kg)    Nutrition Goal  Increase your daily fluid intake with an ultimate goal of 64oz/day, per both bariatric surgery and kidney stone guidelines, Boost, calcuim supplement    Comment   [name] has been doing well with her weight.  She says that she gets plenty of fluids and still eats about 6 small meals a day.  She still does not eat very much.  She is taking Boost daily to increase her calories and protein intake.  She does not eat meat and has been trying to eat more beans.  She is also taking her calcium supplement.     Expected Outcome  Short: Continue to to try to eat more protein.  Long: Continue to heart healthy.        Psychosocial: Target Goals: Acknowledge presence or absence of significant depression and/or stress, maximize coping skills, provide positive support system. Participant is able to verbalize types and ability to use techniques and skills needed for reducing stress and depression.   Initial Review & Psychosocial Screening: Initial Psych Review & Screening - 05/19/17 1405      Initial Review   Current issues with  Current Sleep Concerns;Current Stress Concerns she feels like she sleeps too much, taking afternoon naps and then going to bed  early    Source of Stress Concerns  Unable to perform yard/household activities;Unable to participate in former interests or hobbies      Family Dynamics   Good Support System?  Yes family members      Barriers   Psychosocial barriers to participate in program  The patient should benefit from training in stress management and relaxation.;There are no identifiable barriers or psychosocial needs.      Screening Interventions   Interventions  Encouraged to exercise;Provide feedback about the scores to participant;Program counselor consult;To provide support and resources with identified psychosocial needs    Expected Outcomes  Short Term goal: Utilizing psychosocial counselor, staff and physician to assist with identification of specific Stressors or current issues interfering with healing process. Setting desired goal for each stressor or current issue identified.;Long Term Goal: Stressors or current issues are controlled  or eliminated.;Short Term goal: Identification and review with participant of any Quality of Life or Depression concerns found by scoring the questionnaire.;Long Term goal: The participant improves quality of Life and PHQ9 Scores as seen by post scores and/or verbalization of changes       Quality of Life Scores:  Quality of Life - 05/19/17 1406      Quality of Life Scores   Health/Function Pre  17.04 %    Socioeconomic Pre  26 %    Psych/Spiritual Pre  27.43 %    Family Pre  24 %    GLOBAL Pre  22.02 %      Scores of 19 and below usually indicate a poorer quality of life in these areas.  A difference of  2-3 points is a clinically meaningful difference.  A difference of 2-3 points in the total score of the Quality of Life Index has been associated with significant improvement in overall quality of life, self-image, physical symptoms, and general health in studies assessing change in quality of life.  PHQ-9: Recent Review Flowsheet Data    Depression screen PHQ 2/9 06/09/2017 05/19/2017 12/15/2015   Decreased Interest 0 0 0   Down, Depressed, Hopeless 0 0 0   PHQ - 2 Score 0 0 0   Altered sleeping 0 2 3    Tired, decreased energy 2 2 2    Change in appetite 2 2 0   Feeling bad or failure about yourself  0 0 0   Trouble concentrating 1 1 0   Moving slowly or fidgety/restless 1 1 0   Suicidal thoughts 0 0 0   PHQ-9 Score 6 8 5     Interpretation of Total Score  Total Score Depression Severity:  1-4 = Minimal depression, 5-9 = Mild depression, 10-14 = Moderate depression, 15-19 = Moderately severe depression, 20-27 = Severe depression   Psychosocial Evaluation and Intervention: Psychosocial Evaluation - 06/09/17 0943      Psychosocial Evaluation & Interventions   Interventions  Encouraged to exercise with the program and follow exercise prescription    Comments  Counselor met with Ms. Kinchen ([name]) today for initial psychosocial evaluation.  She is a 77 year old who had a heart  attack and (2) stents inserted approximately one month ago.  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  She has a strong support system with a son locally and active involvement in her local church.  She sleeps well and has a limited appetite due to lap band surgery 10 years ago wherein she lost over   100 lbs since then.  She denies a history of depression or anxiety or any current symptoms and her mood is generally positive.  She has minimal stress in her life other than her health.   [name] has goals to walk without her walker and have a stronger heart.  Staff will follow with her.      Expected Outcomes  Short:  [name] will exercise to be able to walk without the walker.  Long:  [name] will exercise consistently to improve her stamina and strength.      Continue Psychosocial Services   Follow up required by staff       Psychosocial Re-Evaluation: Psychosocial Re-Evaluation    Row Name 06/23/17 1121             Psychosocial Re-Evaluation   Current issues with  None Identified       Comments  [name] is doing well mentally.  She has very little stress, primarily her health.  She does her best to stay even keeled.  The best part has been that she is starting to feel better overall!  She enjoys coming to class.        Expected Outcomes  Short: Continue to attend rehab.  Long: Continue to stay postive.        Interventions  Encouraged to attend Cardiac Rehabilitation for the exercise       Continue Psychosocial Services   Follow up required by staff          Psychosocial Discharge (Final Psychosocial Re-Evaluation): Psychosocial Re-Evaluation - 06/23/17 1121      Psychosocial Re-Evaluation   Current issues with  None Identified    Comments  [name] is doing well mentally.  She has very little stress, primarily her health.  She does her best to stay even keeled.  The best part has been that she is starting to feel better overall!  She  enjoys coming to class.     Expected Outcomes  Short: Continue to attend rehab.  Long: Continue to stay postive.     Interventions  Encouraged to attend Cardiac Rehabilitation for the exercise    Continue Psychosocial Services   Follow up required by staff       Vocational Rehabilitation: Provide vocational rehab assistance to qualifying candidates.   Vocational Rehab Evaluation & Intervention: Vocational Rehab - 05/19/17 1415      Initial Vocational Rehab Evaluation & Intervention   Assessment shows need for Vocational Rehabilitation  No       Education: Education Goals: Education classes will be provided on a variety of topics geared toward better understanding of heart health and risk factor modification. Participant will state understanding/return demonstration of topics presented as noted by education test scores.  Learning Barriers/Preferences: Learning Barriers/Preferences - 05/19/17 1410      Learning Barriers/Preferences   Learning Barriers  None    Learning Preferences  Individual Instruction;Video       Education Topics:  AED/CPR: - Group verbal and written instruction with the use of models to demonstrate the basic use of the AED with the basic ABC's of resuscitation.   General Nutrition Guidelines/Fats and Fiber: -Group instruction provided by verbal, written material, models and posters to present the general guidelines for heart healthy nutrition. Gives an explanation and review of dietary fats and fiber.   Controlling Sodium/Reading Food Labels: -Group verbal and written material supporting the discussion of sodium use in heart healthy nutrition. Review and explanation with models, verbal and written materials for   utilization of the food label.   Cardiac Rehab from 06/23/2017 in [facility] Cardiac and Pulmonary Rehab  Date  06/07/17  Educator  PI  Instruction Review Code  1- Verbalizes Understanding      Exercise Physiology & General Exercise Guidelines: -  Group verbal and written instruction with models to review the exercise physiology of the cardiovascular system and associated critical values. Provides general exercise guidelines with specific guidelines to those with heart or lung disease.    Cardiac Rehab from 06/23/2017 in [facility] Cardiac and Pulmonary Rehab  Date  06/14/17  Educator  AS  Instruction Review Code  1- Verbalizes Understanding      Aerobic Exercise & Resistance Training: - Gives group verbal and written instruction on the various components of exercise. Focuses on aerobic and resistive training programs and the benefits of this training and how to safely progress through these programs..   Flexibility, Balance, Mind/Body Relaxation: Provides group verbal/written instruction on the benefits of flexibility and balance training, including mind/body exercise modes such as yoga, pilates and tai chi.  Demonstration and skill practice provided.   Cardiac Rehab from 06/23/2017 in [place] Cardiac and Pulmonary Rehab  Date  05/19/17  Educator  [facility]  Instruction Review Code  1- Verbalizes Understanding      Stress and Anxiety: - Provides group verbal and written instruction about the health risks of elevated stress and causes of high stress.  Discuss the correlation between heart/lung disease and anxiety and treatment options. Review healthy ways to manage with stress and anxiety.   Depression: - Provides group verbal and written instruction on the correlation between heart/lung disease and depressed mood, treatment options, and the stigmas associated with seeking treatment.   Anatomy & Physiology of the Heart: - Group verbal and written instruction and models provide basic cardiac anatomy and physiology, with the coronary electrical and arterial systems. Review of Valvular disease and Heart Failure   Cardiac Procedures: - Group verbal and written instruction to review commonly prescribed medications for heart disease. Reviews the  medication, class of the drug, and side effects. Includes the steps to properly store meds and maintain the prescription regimen. (beta blockers and nitrates)   Cardiac Medications I: - Group verbal and written instruction to review commonly prescribed medications for heart disease. Reviews the medication, class of the drug, and side effects. Includes the steps to properly store meds and maintain the prescription regimen.   Cardiac Medications II: -Group verbal and written instruction to review commonly prescribed medications for heart disease. Reviews the medication, class of the drug, and side effects. (all other drug classes)   Cardiac Rehab from 06/23/2017 in [facility] Cardiac and Pulmonary Rehab  Date  06/23/17  Educator  CE  Instruction Review Code  1- Verbalizes Understanding       Go Sex-Intimacy & Heart Disease, Get SMART - Goal Setting: - Group verbal and written instruction through game format to discuss heart disease and the return to sexual intimacy. Provides group verbal and written material to discuss and apply goal setting through the application of the S.M.A.R.T. Method.   Other Matters of the Heart: - Provides group verbal, written materials and models to describe Stable Angina and Peripheral Artery. Includes description of the disease process and treatment options available to the cardiac patient.   Exercise & Equipment Safety: - Individual verbal instruction and demonstration of equipment use and safety with use of the equipment.   Cardiac Rehab from 06/23/2017 in [facility] Cardiac and Pulmonary Rehab  Date  05/19/17  Educator  [place]  Instruction Review Code  1- Verbalizes Understanding      Infection Prevention: - Provides verbal and written material to individual with discussion of infection control including proper hand washing and proper equipment cleaning during exercise session.   Cardiac Rehab from 06/23/2017 in [facility] Cardiac and Pulmonary Rehab  Date  05/19/17    Educator  [facility]  Instruction Review Code  1- Verbalizes Understanding      Falls Prevention: - Provides verbal and written material to individual with discussion of falls prevention and safety.   Cardiac Rehab from 06/23/2017 in [facility] Cardiac and Pulmonary Rehab  Date  05/19/17  Educator  [facility]  Instruction Review Code (retired)  1- partially meets, needs review/practice      Diabetes: - Individual verbal and written instruction to review signs/symptoms of diabetes, desired ranges of glucose level fasting, after meals and with exercise. Acknowledge that pre and post exercise glucose checks will be done for 3 sessions at entry of program.   Cardiac Rehab from 06/23/2017 in [facility] Cardiac and Pulmonary Rehab  Date  05/19/17  Educator  [facility]  Instruction Review Code  1- Verbalizes Understanding      Know Your Numbers and Risk Factors: -Group verbal and written instruction about important numbers in your health.  Discussion of what are risk factors and how they play a role in the disease process.  Review of Cholesterol, Blood Pressure, Diabetes, and BMI and the role they play in your overall health.   Cardiac Rehab from 06/23/2017 in [facility] Cardiac and Pulmonary Rehab  Date  06/23/17  Educator  CE  Instruction Review Code  1- Verbalizes Understanding      Sleep Hygiene: -Provides group verbal and written instruction about how sleep can affect your health.  Define sleep hygiene, discuss sleep cycles and impact of sleep habits. Review good sleep hygiene tips.    Other: -Provides group and verbal instruction on various topics (see comments)   Knowledge Questionnaire Score: Knowledge Questionnaire Score - 05/19/17 1415      Knowledge Questionnaire Score   Pre Score  21/28 correct answers reviewed with [name]       Core Components/Risk Factors/Patient Goals at Admission: Personal Goals and Risk Factors at Admission - 05/19/17 1350      Core Components/Risk Factors/Patient Goals on Admission     Weight Management  Obesity;Yes;Weight Loss    Intervention  Weight Management: Develop a combined nutrition and exercise program designed to reach desired caloric intake, while maintaining appropriate intake of nutrient and fiber, sodium and fats, and appropriate energy expenditure required for the weight goal.;Weight Management: Provide education and appropriate resources to help participant work on and attain dietary goals.;Weight Management/Obesity: Establish reasonable short term and long term weight goals.;Obesity: Provide education and appropriate resources to help participant work on and attain dietary goals.    Admit Weight  166 lb (75.3 kg)    Goal Weight: Short Term  162 lb (73.5 kg)    Goal Weight: Long Term  150 lb (68 kg)    Expected Outcomes  Short Term: Continue to assess and modify interventions until short term weight is achieved;Long Term: Adherence to nutrition and physical activity/exercise program aimed toward attainment of established weight goal;Weight Loss: Understanding of general recommendations for a balanced deficit meal plan, which promotes 1-2 lb weight loss per week and includes a negative energy balance of [phone] kcal/d;Understanding recommendations for meals to include 15-35% energy as protein, 25-35% energy from fat, 35-60% energy  from carbohydrates, less than 279m of dietary cholesterol, 20-35 gm of total fiber daily;Understanding of distribution of calorie intake throughout the day with the consumption of 4-5 meals/snacks    Diabetes  Yes    Intervention  Provide education about signs/symptoms and action to take for hypo/hyperglycemia.;Provide education about proper nutrition, including hydration, and aerobic/resistive exercise prescription along with prescribed medications to achieve blood glucose in normal ranges: Fasting glucose 65-99 mg/dL    Expected Outcomes  Short Term: Participant verbalizes understanding of the signs/symptoms and immediate care of  hyper/hypoglycemia, proper foot care and importance of medication, aerobic/resistive exercise and nutrition plan for blood glucose control.;Long Term: Attainment of HbA1C < 7%.    Heart Failure  Yes    Intervention  Provide a combined exercise and nutrition program that is supplemented with education, support and counseling about heart failure. Directed toward relieving symptoms such as shortness of breath, decreased exercise tolerance, and extremity edema.    Expected Outcomes  Improve functional capacity of life;Short term: Attendance in program 2-3 days a week with increased exercise capacity. Reported lower sodium intake. Reported increased fruit and vegetable intake. Reports medication compliance.;Short term: Daily weights obtained and reported for increase. Utilizing diuretic protocols set by physician.;Long term: Adoption of self-care skills and reduction of barriers for early signs and symptoms recognition and intervention leading to self-care maintenance.    Hypertension  Yes    Intervention  Provide education on lifestyle modifcations including regular physical activity/exercise, weight management, moderate sodium restriction and increased consumption of fresh fruit, vegetables, and low fat dairy, alcohol moderation, and smoking cessation.;Monitor prescription use compliance.    Expected Outcomes  Short Term: Continued assessment and intervention until BP is < 140/968mHG in hypertensive participants. < 130/8025mG in hypertensive participants with diabetes, heart failure or chronic kidney disease.;Long Term: Maintenance of blood pressure at goal levels.    Lipids  Yes    Intervention  Provide education and support for participant on nutrition & aerobic/resistive exercise along with prescribed medications to achieve LDL <7m23mDL >40mg28m Expected Outcomes  Short Term: Participant states understanding of desired cholesterol values and is compliant with medications prescribed. Participant is  following exercise prescription and nutrition guidelines.;Long Term: Cholesterol controlled with medications as prescribed, with individualized exercise RX and with personalized nutrition plan. Value goals: LDL < 7mg,30m > 40 mg.       Core Components/Risk Factors/Patient Goals Review:  Goals and Risk Factor Review    Row Name 06/23/17 1041             Core Components/Risk Factors/Patient Goals Review   Personal Goals Review  Weight Management/Obesity;Hypertension;Diabetes;Lipids;Heart Failure       Review  [name]'s weight has been pretty steady around 172 lbs.  She did gain some over the last two weeks as her bowels were not working properly and now after getting a medication to help she is having loose stools.  She is going to keep an eye on this and let her doctor know if it continues.  Her blood pressures have been good and she checks them at home 2-3 times a week.  She is doing well on her medications and has not had any heart failure symptoms.         Expected Outcomes  Short: Continue to work monitor heart failure symptoms.  Long: Continue to work on weight loss.           Core Components/Risk Factors/Patient Goals at Discharge (Final Review):  Goals and Risk Factor Review - 06/23/17 1041      Core Components/Risk Factors/Patient Goals Review   Personal Goals Review  Weight Management/Obesity;Hypertension;Diabetes;Lipids;Heart Failure    Review  [name]'s weight has been pretty steady around 172 lbs.  She did gain some over the last two weeks as her bowels were not working properly and now after getting a medication to help she is having loose stools.  She is going to keep an eye on this and let her doctor know if it continues.  Her blood pressures have been good and she checks them at home 2-3 times a week.  She is doing well on her medications and has not had any heart failure symptoms.      Expected Outcomes  Short: Continue to work monitor heart failure symptoms.  Long: Continue to  work on weight loss.        ITP Comments: ITP Comments    Row Name 05/19/17 1328 05/25/17 1535 06/01/17 0614 06/29/17 0614     ITP Comments  Med review completed. Initial ITP created. Diagnosis can be found in [facility] 4/9  [name] called to leave a message about not coming to class. She was in the ED yesterday and determined to have a kidney stone.  She is on a new medication to help and has a follow up appointment with radiology on Friday. She is going to stay out until she is able to pass the stone or relieve the pain.  [name] will keep [place] up to date with what is going on with her.   30 day review. Continue with ITP unless directed changes per Medical Director  New to program   Med review only  30 day review. Continue with ITP unless directed changes per Medical Director review   Only 3 visits in June       Comments:

## 2017-06-30 ENCOUNTER — Encounter: Payer: Medicare Other | Admitting: *Deleted

## 2017-06-30 DIAGNOSIS — I214 Non-ST elevation (NSTEMI) myocardial infarction: Secondary | ICD-10-CM

## 2017-06-30 DIAGNOSIS — E11621 Type 2 diabetes mellitus with foot ulcer: Secondary | ICD-10-CM | POA: Diagnosis not present

## 2017-06-30 DIAGNOSIS — Z955 Presence of coronary angioplasty implant and graft: Secondary | ICD-10-CM

## 2017-06-30 NOTE — Progress Notes (Signed)
Daily Session Note  Patient Details  Name: Nashley Cordoba Kube MRN: [id number] Date of Birth: 08-24-1940 Referring Provider:     Cardiac Rehab from 05/19/2017 in [facility] Cardiac and Pulmonary Rehab  Referring Provider  [place]      Encounter Date: 06/30/2017  Check In: Session Check In - 06/30/17 0839      Check-In   Location  [facility]-Cardiac & Pulmonary Rehab    Staff Present  Alberteen Sam, MA, RCEP, CCRP, Exercise Physiologist;Amanda Oletta Darter, BA, ACSM CEP, Exercise Physiologist;Carroll Enterkin, RN, BSN    Supervising physician immediately available to respond to emergencies  See telemetry face sheet for immediately available ER MD    Medication changes reported      No    Fall or balance concerns reported     No    Warm-up and Cool-down  Performed on first and last piece of equipment    Resistance Training Performed  Yes    VAD Patient?  No      Pain Assessment   Currently in Pain?  No/denies          Social History   Tobacco Use  Smoking Status Never Smoker  Smokeless Tobacco Never Used    Goals Met:  Independence with exercise equipment Exercise tolerated well No report of cardiac concerns or symptoms Strength training completed today  Goals Unmet:  Not Applicable  Comments: Pt able to follow exercise prescription today without complaint.  Will continue to monitor for progression. Reviewed home exercise with pt today.  Pt plans to walk at home and in her pool for exercise.  Reviewed THR, pulse, RPE, sign and symptoms, NTG use, and when to call 911 or MD.  Also discussed weather considerations and indoor options.  Pt voiced understanding.    Dr. Emily Filbert is Medical Director for [place] and [facility].

## 2017-07-05 DIAGNOSIS — E11621 Type 2 diabetes mellitus with foot ulcer: Secondary | ICD-10-CM | POA: Diagnosis not present

## 2017-07-05 DIAGNOSIS — Z955 Presence of coronary angioplasty implant and graft: Secondary | ICD-10-CM

## 2017-07-05 DIAGNOSIS — I214 Non-ST elevation (NSTEMI) myocardial infarction: Secondary | ICD-10-CM

## 2017-07-05 DIAGNOSIS — Z951 Presence of aortocoronary bypass graft: Secondary | ICD-10-CM

## 2017-07-05 NOTE — Progress Notes (Signed)
Daily Session Note  Patient Details  Name: [name] MRN: [id number] Date of Birth: Aug 11, 1940 Referring Provider:     Cardiac Rehab from 05/19/2017 in [facility] Cardiac and Pulmonary Rehab  Referring Provider  [place]      Encounter Date: 07/05/2017  Check In: Session Check In - 07/05/17 0857      Check-In   Location  [facility]-Cardiac & Pulmonary Rehab    Staff Present  Heath Lark, RN, BSN, CCRP;Jessica [place], MA, RCEP, CCRP, Exercise Physiologist;Abeer Iversen Oletta Darter, [place], ACSM CEP, Exercise Physiologist    Supervising physician immediately available to respond to emergencies  See telemetry face sheet for immediately available ER MD    Medication changes reported      No    Fall or balance concerns reported     No    Warm-up and Cool-down  Performed on first and last piece of equipment    Resistance Training Performed  Yes    VAD Patient?  No    PAD/SET Patient?  No      Pain Assessment   Currently in Pain?  No/denies    Multiple Pain Sites  No          Social History   Tobacco Use  Smoking Status Never Smoker  Smokeless Tobacco Never Used    Goals Met:  Independence with exercise equipment Exercise tolerated well No report of cardiac concerns or symptoms Strength training completed today  Goals Unmet:  Not Applicable  Comments: Pt able to follow exercise prescription today without complaint.  Will continue to monitor for progression.    Dr. Emily Filbert is Medical Director for [place] and [facility].

## 2017-07-07 DIAGNOSIS — I214 Non-ST elevation (NSTEMI) myocardial infarction: Secondary | ICD-10-CM

## 2017-07-07 DIAGNOSIS — E11621 Type 2 diabetes mellitus with foot ulcer: Secondary | ICD-10-CM | POA: Diagnosis not present

## 2017-07-07 NOTE — Progress Notes (Signed)
Daily Session Note  Patient Details  Name: [name] MRN: [id number] Date of Birth: Jul 19, 1940 Referring Provider:     Cardiac Rehab from 05/19/2017 in [facility] Cardiac and Pulmonary Rehab  Referring Provider  [place]ada      Encounter Date: 07/07/2017  Check In: Session Check In - 07/07/17 0817      Check-In   Location  [facility]-Cardiac & Pulmonary Rehab    Staff Present  Justin Mend RCP,RRT,BSRT;Carroll Enterkin, RN, Levie Heritage, MA, RCEP, CCRP, Exercise Physiologist    Supervising physician immediately available to respond to emergencies  See telemetry face sheet for immediately available ER MD    Medication changes reported      No    Fall or balance concerns reported     No    Tobacco Cessation  No Change    Warm-up and Cool-down  Performed on first and last piece of equipment    Resistance Training Performed  Yes    VAD Patient?  No    PAD/SET Patient?  No      Pain Assessment   Currently in Pain?  No/denies          Social History   Tobacco Use  Smoking Status Never Smoker  Smokeless Tobacco Never Used    Goals Met:  Independence with exercise equipment Exercise tolerated well No report of cardiac concerns or symptoms Strength training completed today  Goals Unmet:  Not Applicable  Comments: Pt able to follow exercise prescription today without complaint.  Will continue to monitor for progression.   Dr. Emily Filbert is Medical Director for [place] and [facility].

## 2017-07-12 ENCOUNTER — Encounter: Payer: Medicare Other | Attending: Internal Medicine

## 2017-07-12 DIAGNOSIS — I110 Hypertensive heart disease with heart failure: Secondary | ICD-10-CM | POA: Insufficient documentation

## 2017-07-12 DIAGNOSIS — I2510 Atherosclerotic heart disease of native coronary artery without angina pectoris: Secondary | ICD-10-CM | POA: Insufficient documentation

## 2017-07-12 DIAGNOSIS — D649 Anemia, unspecified: Secondary | ICD-10-CM | POA: Insufficient documentation

## 2017-07-12 DIAGNOSIS — I509 Heart failure, unspecified: Secondary | ICD-10-CM | POA: Insufficient documentation

## 2017-07-12 DIAGNOSIS — Z951 Presence of aortocoronary bypass graft: Secondary | ICD-10-CM | POA: Insufficient documentation

## 2017-07-12 DIAGNOSIS — Z7901 Long term (current) use of anticoagulants: Secondary | ICD-10-CM | POA: Insufficient documentation

## 2017-07-12 DIAGNOSIS — C9000 Multiple myeloma not having achieved remission: Secondary | ICD-10-CM | POA: Insufficient documentation

## 2017-07-12 DIAGNOSIS — Z7982 Long term (current) use of aspirin: Secondary | ICD-10-CM | POA: Insufficient documentation

## 2017-07-12 DIAGNOSIS — L97521 Non-pressure chronic ulcer of other part of left foot limited to breakdown of skin: Secondary | ICD-10-CM | POA: Insufficient documentation

## 2017-07-12 DIAGNOSIS — E1151 Type 2 diabetes mellitus with diabetic peripheral angiopathy without gangrene: Secondary | ICD-10-CM | POA: Insufficient documentation

## 2017-07-12 DIAGNOSIS — I252 Old myocardial infarction: Secondary | ICD-10-CM | POA: Insufficient documentation

## 2017-07-12 DIAGNOSIS — E11621 Type 2 diabetes mellitus with foot ulcer: Secondary | ICD-10-CM | POA: Insufficient documentation

## 2017-07-12 DIAGNOSIS — Z880 Allergy status to penicillin: Secondary | ICD-10-CM | POA: Insufficient documentation

## 2017-07-12 DIAGNOSIS — E1142 Type 2 diabetes mellitus with diabetic polyneuropathy: Secondary | ICD-10-CM | POA: Insufficient documentation

## 2017-07-19 DIAGNOSIS — D649 Anemia, unspecified: Secondary | ICD-10-CM | POA: Diagnosis not present

## 2017-07-19 DIAGNOSIS — I509 Heart failure, unspecified: Secondary | ICD-10-CM | POA: Diagnosis not present

## 2017-07-19 DIAGNOSIS — Z7901 Long term (current) use of anticoagulants: Secondary | ICD-10-CM | POA: Diagnosis not present

## 2017-07-19 DIAGNOSIS — L97521 Non-pressure chronic ulcer of other part of left foot limited to breakdown of skin: Secondary | ICD-10-CM | POA: Diagnosis present

## 2017-07-19 DIAGNOSIS — I252 Old myocardial infarction: Secondary | ICD-10-CM | POA: Diagnosis not present

## 2017-07-19 DIAGNOSIS — I110 Hypertensive heart disease with heart failure: Secondary | ICD-10-CM | POA: Diagnosis not present

## 2017-07-19 DIAGNOSIS — E11621 Type 2 diabetes mellitus with foot ulcer: Secondary | ICD-10-CM | POA: Diagnosis not present

## 2017-07-19 DIAGNOSIS — Z955 Presence of coronary angioplasty implant and graft: Secondary | ICD-10-CM

## 2017-07-19 DIAGNOSIS — Z951 Presence of aortocoronary bypass graft: Secondary | ICD-10-CM | POA: Diagnosis not present

## 2017-07-19 DIAGNOSIS — I214 Non-ST elevation (NSTEMI) myocardial infarction: Secondary | ICD-10-CM

## 2017-07-19 DIAGNOSIS — E1142 Type 2 diabetes mellitus with diabetic polyneuropathy: Secondary | ICD-10-CM | POA: Diagnosis not present

## 2017-07-19 DIAGNOSIS — E1151 Type 2 diabetes mellitus with diabetic peripheral angiopathy without gangrene: Secondary | ICD-10-CM | POA: Diagnosis not present

## 2017-07-19 DIAGNOSIS — C9000 Multiple myeloma not having achieved remission: Secondary | ICD-10-CM | POA: Diagnosis not present

## 2017-07-19 DIAGNOSIS — Z7982 Long term (current) use of aspirin: Secondary | ICD-10-CM | POA: Diagnosis not present

## 2017-07-19 DIAGNOSIS — I2510 Atherosclerotic heart disease of native coronary artery without angina pectoris: Secondary | ICD-10-CM | POA: Diagnosis not present

## 2017-07-19 DIAGNOSIS — Z880 Allergy status to penicillin: Secondary | ICD-10-CM | POA: Diagnosis not present

## 2017-07-19 NOTE — Progress Notes (Signed)
Daily Session Note  Patient Details  Name: [name] MRN: [id number] Date of Birth: 1940-07-11 Referring Provider:     Cardiac Rehab from 05/19/2017 in [facility] Cardiac and Pulmonary Rehab  Referring Provider  K[place]ada      Encounter Date: 07/19/2017  Check In: Session Check In - 07/19/17 0818      Check-In   Location  [facility]-Cardiac & Pulmonary Rehab    Staff Present  Heath Lark, RN, BSN, CCRP;Joseph Hood RCP,RRT,BSRT;Mandi Mitchell, BS, [place];Nada Maclachlan, BA, ACSM CEP, Exercise Physiologist    Supervising physician immediately available to respond to emergencies  See telemetry face sheet for immediately available ER MD    Medication changes reported      No    Fall or balance concerns reported     No    Warm-up and Cool-down  Performed on first and last piece of equipment    Resistance Training Performed  Yes    VAD Patient?  No    PAD/SET Patient?  No      Pain Assessment   Currently in Pain?  No/denies    Multiple Pain Sites  No          Social History   Tobacco Use  Smoking Status Never Smoker  Smokeless Tobacco Never Used    Goals Met:  Independence with exercise equipment Exercise tolerated well No report of cardiac concerns or symptoms Strength training completed today  Goals Unmet:  Not Applicable  Comments: Pt able to follow exercise prescription today without complaint.  Will continue to monitor for progression.    Dr. Emily Filbert is Medical Director for [place] and [facility].

## 2017-07-21 DIAGNOSIS — Z951 Presence of aortocoronary bypass graft: Secondary | ICD-10-CM

## 2017-07-21 DIAGNOSIS — Z955 Presence of coronary angioplasty implant and graft: Secondary | ICD-10-CM

## 2017-07-21 DIAGNOSIS — I214 Non-ST elevation (NSTEMI) myocardial infarction: Secondary | ICD-10-CM

## 2017-07-21 DIAGNOSIS — E11621 Type 2 diabetes mellitus with foot ulcer: Secondary | ICD-10-CM | POA: Diagnosis not present

## 2017-07-21 NOTE — Progress Notes (Signed)
Daily Session Note  Patient Details  Name: [name] MRN: [id number] Date of Birth: 10/27/1940 Referring Provider:     Cardiac Rehab from 05/19/2017 in [facility] Cardiac and Pulmonary Rehab  Referring Provider  [place]      Encounter Date: 07/21/2017  Check In: Session Check In - 07/21/17 0953      Check-In   Location  [facility]-Cardiac & Pulmonary Rehab    Staff Present  Constance Goltz, RN BSN;Susanne Bice, RN, BSN, Lance Sell, BA, ACSM CEP, Exercise Physiologist    Supervising physician immediately available to respond to emergencies  See telemetry face sheet for immediately available ER MD    Medication changes reported      No    Fall or balance concerns reported     No    Warm-up and Cool-down  Performed on first and last piece of equipment    Resistance Training Performed  Yes    VAD Patient?  No    PAD/SET Patient?  No      Pain Assessment   Currently in Pain?  No/denies    Multiple Pain Sites  No          Social History   Tobacco Use  Smoking Status Never Smoker  Smokeless Tobacco Never Used    Goals Met:  Independence with exercise equipment Exercise tolerated well No report of cardiac concerns or symptoms Strength training completed today  Goals Unmet:  Not Applicable  Comments: Pt able to follow exercise prescription today without complaint.  Will continue to monitor for progression.    Dr. Emily Filbert is Medical Director for [place] and [facility].

## 2017-07-23 ENCOUNTER — Other Ambulatory Visit: Payer: Self-pay | Admitting: Cardiology

## 2017-07-26 DIAGNOSIS — Z955 Presence of coronary angioplasty implant and graft: Secondary | ICD-10-CM

## 2017-07-26 DIAGNOSIS — I214 Non-ST elevation (NSTEMI) myocardial infarction: Secondary | ICD-10-CM

## 2017-07-26 DIAGNOSIS — E11621 Type 2 diabetes mellitus with foot ulcer: Secondary | ICD-10-CM | POA: Diagnosis not present

## 2017-07-26 NOTE — Progress Notes (Signed)
Daily Session Note  Patient Details  Name: [name] MRN: [id number] Date of Birth: [birth year] Referring Provider:     Cardiac Rehab from 05/19/2017 in [facility]  Referring Provider  [place]      Encounter Date: 07/26/2017  Check In: Session Check In - 07/26/17 0849      Check-In   Location  [facility]    Staff Present  Heath Lark, RN, BSN, CCRP;Jessica Homestead, MA, RCEP, CCRP, Exercise Physiologist;Amanda Oletta Darter, BA, ACSM CEP, Exercise Physiologist;Mandi Zachery Conch, BS, [facility]    Supervising physician immediately available to respond to emergencies  See telemetry face sheet for immediately available ER MD    Medication changes reported      No    Fall or balance concerns reported     No    Warm-up and Cool-down  Performed on first and last piece of equipment    Resistance Training Performed  Yes    VAD Patient?  No      Pain Assessment   Currently in Pain?  No/denies          Social History   Tobacco Use  Smoking Status Never Smoker  Smokeless Tobacco Never Used    Goals Met:  Independence with exercise equipment Exercise tolerated well Personal goals reviewed No report of cardiac concerns or symptoms Strength training completed today  Goals Unmet:  Not Applicable  Comments: Pt able to follow exercise prescription today without complaint.  Will continue to monitor for progression. See ITP for goal review   Dr. Emily Filbert is Medical Director for [facility].

## 2017-07-27 ENCOUNTER — Encounter: Payer: Self-pay | Admitting: *Deleted

## 2017-07-27 DIAGNOSIS — Z955 Presence of coronary angioplasty implant and graft: Secondary | ICD-10-CM

## 2017-07-27 DIAGNOSIS — I214 Non-ST elevation (NSTEMI) myocardial infarction: Secondary | ICD-10-CM

## 2017-07-27 NOTE — Progress Notes (Signed)
Cardiac Individual Treatment Plan  Patient Details  Name: [name] MRN: [id number] Date of Birth: 03-Jan-1941 Referring Provider:     Cardiac Rehab from 05/19/2017 in [facility] Cardiac and Pulmonary Rehab  Referring Provider  [facility]      Initial Encounter Date:    Cardiac Rehab from 05/19/2017 in [facility] Cardiac and Pulmonary Rehab  Date  05/19/17      Visit Diagnosis: NSTEMI (non-ST elevated myocardial infarction) [facility])  Status post coronary artery stent placement  Patient's Home Medications on Admission:  Current Outpatient Medications:  .  Alirocumab (PRALUENT) 75 MG/ML SOPN, Inject 1 pen into the skin every 14 (fourteen) days. STUDY MEDICATION, Disp: 2 pen, Rfl: 11 .  aspirin 81 MG tablet, Take 81 mg by mouth daily., Disp: , Rfl:  .  Cholecalciferol (VITAMIN D3) 50000 units CAPS, Take 1 capsule by mouth 2 (two) times a week. Sunday AND Wednesday, Disp: , Rfl:  .  clopidogrel (PLAVIX) 75 MG tablet, , Disp: , Rfl: 11 .  diclofenac sodium (VOLTAREN) 1 % GEL, Apply 2 g topically daily as needed for pain., Disp: , Rfl:  .  feeding supplement, ENSURE ENLIVE, (ENSURE ENLIVE) LIQD, Take 237 mLs by mouth 2 (two) times daily between meals., Disp: 237 mL, Rfl: 12 .  fentaNYL (DURAGESIC - DOSED MCG/HR) 50 MCG/HR, Place 50 mcg onto the skin every 3 (three) days. , Disp: , Rfl:  .  fluticasone (FLONASE) 50 MCG/ACT nasal spray, Place 2 sprays into both nostrils daily as needed for allergies. , Disp: , Rfl:  .  furosemide (LASIX) 20 MG tablet, Take 1 tablet (20 mg total) by mouth daily as needed for fluid or edema., Disp: 30 tablet, Rfl: 3 .  gabapentin (NEURONTIN) 300 MG capsule, Take 300-600 mg by mouth See admin instructions. Pt takes 676m in am, 3063mat bedtime, Disp: , Rfl:  .  hydrocortisone cream 1 %, Apply topically as needed for itching., Disp: 30 g, Rfl: 0 .  isosorbide mononitrate (IMDUR) 60 MG 24 hr tablet, Take 1 tablet (60 mg total) by mouth daily. Please make yearly appt with Dr.  NeMeda Coffeeor September before anymore refills. 1st attempt, Disp: 90 tablet, Rfl: 0 .  lisinopril (PRINIVIL,ZESTRIL) 5 MG tablet, Take 1 tablet (5 mg total) by mouth daily., Disp: 30 tablet, Rfl: 12 .  metFORMIN (GLUCOPHAGE) 500 MG tablet, Take 500 mg by mouth 2 (two) times daily with a meal. , Disp: , Rfl:  .  metoprolol succinate (TOPROL-XL) 25 MG 24 hr tablet, Take by mouth., Disp: , Rfl:  .  metoprolol tartrate (LOPRESSOR) 25 MG tablet, Take 0.5 tablets (12.5 mg total) by mouth 2 (two) times daily., Disp: 90 tablet, Rfl: 3 .  nitroGLYCERIN (NITROSTAT) 0.4 MG SL tablet, Place 1 tablet (0.4 mg total) under the tongue every 5 (five) minutes as needed for chest pain., Disp: 25 tablet, Rfl: 2 .  Omega-3 Fatty Acids (FISH OIL) 1000 MG CAPS, Take 2 capsules (2,000 mg total) by mouth daily., Disp: 60 capsule, Rfl: 0 .  ondansetron (ZOFRAN ODT) 8 MG disintegrating tablet, Take 1 tablet (8 mg total) by mouth every 8 (eight) hours as needed for nausea or vomiting., Disp: 120 tablet, Rfl: 2 .  pantoprazole (PROTONIX) 40 MG tablet, Take 1 tablet (40 mg total) by mouth daily at 6 (six) AM., Disp: 30 tablet, Rfl: 0 .  promethazine (PHENERGAN) 12.5 MG tablet, Take 1 tablet (12.5 mg total) by mouth every 6 (six) hours., Disp: 100 tablet, Rfl: 0 .  Vitamin D, Ergocalciferol, (DRISDOL) 50000 units CAPS capsule, , Disp: , Rfl: 5  Current Facility-Administered Medications:  .  0.9 %  sodium chloride infusion, 500 mL, Intravenous, Continuous, Milus Banister, MD  Past Medical History: Past Medical History:  Diagnosis Date  . Anginal pain ([place])   . Anxiety   . Arthritis    "shoulders primarily" (01/13/2016)  . CAD (coronary artery disease)    s/p CABG in Oct 2017  . CHF (congestive heart failure) ([place])   . Chronic pain    from her multiple myeloma but says that her pain is usually in her legs/notes 01/13/2016  . High cholesterol   . History of blood transfusion    "numerous; related to procedures for my heart"  (01/13/2016)  . Hypertension   . Ischemic cardiomyopathy    /nots 01/13/2016  . MI (myocardial infarction) ([place])    EKG on arrival 01/13/2016 showed NSR with evidence of old anterior and inferior infarcts  . MI (myocardial infarction) ([place]) 08/2015  . Multiple myeloma ([place]) dx'd 2015   off chemo since 2016  . Nausea and vomiting 05/10/2016   3-4 month hx of nausea and vomiting  . PAD (peripheral artery disease) ([place])   . Stroke [facility])    "several in 1 year; ? year" (01/13/2016)  . Type II diabetes mellitus (H[facility])     Tobacco Use: Social History   Tobacco Use  Smoking Status Never Smoker  Smokeless Tobacco Never Used    Labs: Recent Review Flowsheet Data    Labs for ITP Cardiac and Pulmonary Rehab Latest Ref Rng & Units 03/05/2010 01/14/2016 05/04/2016 06/03/2016 09/18/2016   Cholestrol 100 - 199 mg/dL - 207(H) 162 127 -   LDLCALC 0 - 99 mg/dL - 133(H) 68 49 -   HDL >39 mg/dL - 31(L) 46 55 -   Trlycerides 0 - 149 mg/dL - 214(H) 241(H) 117 -   Hemoglobin A1c 4.8 - 5.6 % - 7.6(H) - - 7.9(H)   TCO2 0 - 100 mmol/L 22 - - - -       Exercise Target Goals:    Exercise Program Goal: Individual exercise prescription set using results from initial 6 min walk test and THRR while considering  patient's activity barriers and safety.   Exercise Prescription Goal: Initial exercise prescription builds to 30-45 minutes a day of aerobic activity, 2-3 days per week.  Home exercise guidelines will be given to patient during program as part of exercise prescription that the participant will acknowledge.  Activity Barriers & Risk Stratification: Activity Barriers & Cardiac Risk Stratification - 05/19/17 1418      Activity Barriers & Cardiac Risk Stratification   Activity Barriers  Arthritis;Shortness of Breath;Joint Problems    Cardiac Risk Stratification  Moderate       6 Minute Walk: 6 Minute Walk    Row Name 05/19/17 1342         6 Minute Walk   Distance  733 feet     Walk Time  5.5  minutes     # of Rest Breaks  1     MPH  1.4     METS  1.33     RPE  10     Perceived Dyspnea   3     VO2 Peak  4.67     Symptoms  Yes (comment)     Comments  Short of breath - 3 on SOB scale     Resting HR  57 bpm  Resting BP  122/54     Resting Oxygen Saturation   98 %     Exercise Oxygen Saturation  during 6 min walk  94 %     Max Ex. HR  94 bpm     Max Ex. BP  136/62     2 Minute Post BP  122/56        Oxygen Initial Assessment:   Oxygen Re-Evaluation:   Oxygen Discharge (Final Oxygen Re-Evaluation):   Initial Exercise Prescription: Initial Exercise Prescription - 05/19/17 1300      Date of Initial Exercise RX and Referring Provider   Date  05/19/17    Referring Provider  [place]      NuStep   Level  1    SPM  80    Minutes  15    METs  1.3      Arm Ergometer   Level  1    Minutes  15    METs  1.3      Track   Laps  11    Minutes  15    METs  1.5      Prescription Details   Frequency (times per week)  2    Duration  Progress to 30 minutes of continuous aerobic without signs/symptoms of physical distress      Intensity   THRR 40-80% of Max Heartrate  92-127    Ratings of Perceived Exertion  11-13    Perceived Dyspnea  0-4      Resistance Training   Training Prescription  Yes    Weight  2 lb    Reps  10-15       Perform Capillary Blood Glucose checks as needed.  Exercise Prescription Changes: Exercise Prescription Changes    Row Name 05/19/17 1300 06/07/17 1700 06/21/17 1500 06/30/17 0900 07/07/17 0800     Response to Exercise   Blood Pressure (Admit)  122/54  116/56  104/48  -  116/54   Blood Pressure (Exercise)  136/62  146/64  138/52  -  138/70   Blood Pressure (Exit)  122/56  124/60  142/54  -  132/60   Heart Rate (Admit)  58 bpm  60 bpm  76 bpm  -  67 bpm   Heart Rate (Exercise)  94 bpm  82 bpm  83 bpm  -  89 bpm   Heart Rate (Exit)  84 bpm  65 bpm  71 bpm  -  72 bpm   Oxygen Saturation (Admit)  98 %  -  -  -  -   Oxygen  Saturation (Exercise)  98 %  -  -  -  -   Rating of Perceived Exertion (Exercise)  _0 -  12   Symptoms  -  none   none  -  none   Comments  -  first full day of exercise  -  -  -   Duration  -  Continue with 30 min of aerobic exercise without signs/symptoms of physical distress.  Continue with 30 min of aerobic exercise without signs/symptoms of physical distress.  -  Continue with 30 min of aerobic exercise without signs/symptoms of physical distress.   Intensity  -  THRR unchanged  THRR unchanged  -  THRR unchanged     Progression   Progression  -  Continue to progress workloads to maintain intensity without signs/symptoms of physical distress.  Continue to progress workloads to maintain intensity without  signs/symptoms of physical distress.  -  Continue to progress workloads to maintain intensity without signs/symptoms of physical distress.   Average METs  -  1.6  2  -  3.45     Resistance Training   Training Prescription  -  Yes  Yes  -  Yes   Weight  -  2 lb  2 lbs  -  2 lbs   Reps  -  10-15  10-15  -  10-15     Interval Training   Interval Training  -  No  No  -  No     Recumbant Bike   Level  -  -  1  -  1   Watts  -  -  -  -  34   Minutes  -  -  15  -  15   METs  -  -  -  -  3.78     NuStep   Level  -  1  -  -  1   Minutes  -  15  -  -  15   METs  -  1.6  -  -  1.9     Arm Ergometer   Level  -  1  -  -  -   Minutes  -  15  -  -  -     Track   Laps  -  -  22  -  24   Minutes  -  -  15  -  15   METs  -  -  2  -  2.11     Home Exercise Plan   Plans to continue exercise at  -  -  -  Home (comment) walking and walking in pool  Home (comment) walking and walking in pool   Frequency  -  -  -  Add 2 additional days to program exercise sessions.  Add 2 additional days to program exercise sessions.   Initial Home Exercises Provided  -  -  -  06/30/17  06/30/17   Row Name 07/19/17 1500             Response to Exercise   Blood Pressure (Admit)  134/62        Blood Pressure (Exercise)  125/56       Blood Pressure (Exit)  128/50       Heart Rate (Admit)  63 bpm       Heart Rate (Exercise)  72 bpm       Heart Rate (Exit)  62 bpm       Rating of Perceived Exertion (Exercise)  15       Symptoms  none       Duration  Continue with 30 min of aerobic exercise without signs/symptoms of physical distress.       Intensity  THRR unchanged         Progression   Progression  Continue to progress workloads to maintain intensity without signs/symptoms of physical distress.       Average METs  1.75         Resistance Training   Training Prescription  Yes       Weight  2 lb       Reps  10-15         Interval Training   Interval Training  No         NuStep   Level  1  Minutes  15       METs  1.5         Track   Laps  24       Minutes  15       METs  2.11          Exercise Comments: Exercise Comments    Row Name 06/07/17 [phone]           Exercise Comments   First full day of exercise!  Patient was oriented to gym and equipment including functions, settings, policies, and procedures.  Patient's individual exercise prescription and treatment plan were reviewed.  All starting workloads were established based on the results of the 6 minute walk test done at initial orientation visit.  The plan for exercise progression was also introduced and progression will be customized based on patient's performance and goals.          Exercise Goals and Review: Exercise Goals    Row Name 05/19/17 1342             Exercise Goals   Increase Physical Activity  Yes       Intervention  Provide advice, education, support and counseling about physical activity/exercise needs.;Develop an individualized exercise prescription for aerobic and resistive training based on initial evaluation findings, risk stratification, comorbidities and participant's personal goals.       Expected Outcomes  Short Term: Attend rehab on a regular basis to increase amount of  physical activity.;Long Term: Add in home exercise to make exercise part of routine and to increase amount of physical activity.;Long Term: Exercising regularly at least 3-5 days a week.       Increase Strength and Stamina  Yes       Intervention  Provide advice, education, support and counseling about physical activity/exercise needs.;Develop an individualized exercise prescription for aerobic and resistive training based on initial evaluation findings, risk stratification, comorbidities and participant's personal goals.       Expected Outcomes  Short Term: Increase workloads from initial exercise prescription for resistance, speed, and METs.;Short Term: Perform resistance training exercises routinely during rehab and add in resistance training at home;Long Term: Improve cardiorespiratory fitness, muscular endurance and strength as measured by increased METs and functional capacity (6MWT)       Able to understand and use rate of perceived exertion (RPE) scale  Yes       Intervention  Provide education and explanation on how to use RPE scale       Expected Outcomes  Short Term: Able to use RPE daily in rehab to express subjective intensity level;Long Term:  Able to use RPE to guide intensity level when exercising independently       Able to understand and use Dyspnea scale  Yes       Intervention  Provide education and explanation on how to use Dyspnea scale       Expected Outcomes  Short Term: Able to use Dyspnea scale daily in rehab to express subjective sense of shortness of breath during exertion;Long Term: Able to use Dyspnea scale to guide intensity level when exercising independently       Knowledge and understanding of Target Heart Rate Range (THRR)  Yes       Intervention  Provide education and explanation of THRR including how the numbers were predicted and where they are located for reference       Expected Outcomes  Short Term: Able to state/look up THRR;Short Term: Able to use daily as  guideline for intensity in rehab;Long Term: Able to use THRR to govern intensity when exercising independently       Able to check pulse independently  Yes       Intervention  Provide education and demonstration on how to check pulse in carotid and radial arteries.;Review the importance of being able to check your own pulse for safety during independent exercise       Expected Outcomes  Short Term: Able to explain why pulse checking is important during independent exercise;Long Term: Able to check pulse independently and accurately       Understanding of Exercise Prescription  Yes       Intervention  Provide education, explanation, and written materials on patient's individual exercise prescription       Expected Outcomes  Short Term: Able to explain program exercise prescription;Long Term: Able to explain home exercise prescription to exercise independently          Exercise Goals Re-Evaluation : Exercise Goals Re-Evaluation    Row Name 06/07/17 0828 06/21/17 1521 06/23/17 0957 06/30/17 0929 07/07/17 0846     Exercise Goal Re-Evaluation   Exercise Goals Review  Increase Physical Activity;Increase Strength and Stamina;Able to understand and use rate of perceived exertion (RPE) scale  Increase Physical Activity;Increase Strength and Stamina;Able to understand and use rate of perceived exertion (RPE) scale  Increase Physical Activity;Increase Strength and Stamina;Understanding of Exercise Prescription  Increase Physical Activity;Able to understand and use rate of perceived exertion (RPE) scale;Knowledge and understanding of Target Heart Rate Range (THRR);Understanding of Exercise Prescription  Increase Physical Activity;Understanding of Exercise Prescription;Increase Strength and Stamina   Comments  Reviewed RPE scale, THR and program prescription with pt today.  Pt voiced understanding and was given a copy of goals to take home  [name] is off to a good start in rehab.  She is now up to 22 laps on the  track.  We will continue to monitor her progression.   [name] has been doing well with exercise.  She is already starting to feel stronger and has more stamina.  She would like to continue to work on her balance.  She is currently getting some exercise at home.  She has been walking in the pool at home for 600-800 ft each day.  We will talk about home exercise guidelines soon and working her way up 40mn all at once.   Reviewed home exercise with pt today.  Pt plans to walk at home and in her pool for exercise.  Reviewed THR, pulse, RPE, sign and symptoms, NTG use, and when to call 911 or MD.  Also discussed weather considerations and indoor options.  Pt voiced understanding.  JBethena Royshas been doing well in rehab. She is getting in 24 laps at around the track!!  We will continue to monitor her progress.    Expected Outcomes  Short: Use RPE daily to regulate intensity.  Long: Follow program prescription in THR.  Short: Continue to increase workloads.  Long: Continue to increase activity levels.   Short: Review home exercise guidelines.  Long: Continue to increase strength and stamina.   Short: Add in walking at home routines for 30 min.  Long: Continue to increase activity levels  Short: Continue to add in walking at home.  Long: Continue to exercise independently.    RSt. 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  She sees her Dr in 2 weeks and staff will follow up with exercise changes.  Main concern is fatigue.       Expected Outcomes  Short - [name] will find out a plan for CLL Long - [name] will complete as much of HT as she is able          Discharge Exercise Prescription (Final Exercise Prescription Changes): Exercise Prescription Changes - 07/19/17 1500      Response to Exercise   Blood  Pressure (Admit)  134/62    Blood Pressure (Exercise)  125/56    Blood Pressure (Exit)  128/50    Heart Rate (Admit)  63 bpm    Heart Rate (Exercise)  72 bpm    Heart Rate (Exit)  62 bpm    Rating of Perceived Exertion (Exercise)  15    Symptoms  none    Duration  Continue with 30 min of aerobic exercise without signs/symptoms of physical distress.    Intensity  THRR unchanged      Progression   Progression  Continue to progress workloads to maintain intensity without signs/symptoms of physical distress.    Average METs  1.75      Resistance Training   Training Prescription  Yes    Weight  2 lb    Reps  10-15      Interval Training   Interval Training  No      NuStep   Level  1    Minutes  15    METs  1.5      Track   Laps  24    Minutes  15    METs  2.11       Nutrition:  Target Goals: Understanding of nutrition guidelines, daily intake of sodium '1500mg'$ , cholesterol '200mg'$ , calories 30% from fat and 7% or less from saturated fats, daily to have 5 or more servings of fruits and vegetables.  Biometrics: Pre Biometrics - 05/19/17 1340      Pre Biometrics   Height  5' 2.5" (1.588 m)    Weight  174 lb 1.6 oz (79 kg)    Waist Circumference  37 inches    Hip Circumference  48 inches    Waist to Hip Ratio  0.77 %    BMI (Calculated)  31.32    Single Leg Stand  1.5 seconds        Nutrition Therapy Plan and Nutrition Goals: Nutrition Therapy & Goals - 06/16/17 1010      Nutrition Therapy   Diet  TLC    Protein (specify units)  9    Fiber  25 grams    Whole Grain Foods  3 servings    Saturated Fats  11 max. grams    Fruits and Vegetables  4 servings/day 8 ideal, eats small portions d/t h/o bariatric surgery    Sodium  2000 grams      Personal Nutrition Goals   Nutrition Goal  Increase intake of Boost supplements to once every other day at minimum, once daily ideally to increase your protein intake    Personal Goal #2  Start taking a calcium supplement daily      Personal Goal #3  Increase your daily fluid intake with an ultimate goal of 64oz/day, per both bariatric surgery and kidney stone guidelines    Comments  She  had bariatric surgery 10 years ago and struggles to eat more than 2oz of food at a time. She is not currently meeting her protein needs and is likely not meeting her fluid needs      Intervention Plan   Intervention  Prescribe, educate and counsel regarding individualized specific dietary modifications aiming towards targeted core components such as weight, hypertension, lipid management, diabetes, heart failure and other comorbidities.;Nutrition handout(s) given to patient. Nutrition therapy for kidney stones, Lifelong dietary management for bariatric surgery handouts provided    Expected Outcomes  Short Term Goal: Understand basic principles of dietary content, such as calories, fat, sodium, cholesterol and nutrients.;Short Term Goal: A plan has been developed with personal nutrition goals set during dietitian appointment.;Long Term Goal: Adherence to prescribed nutrition plan.       Nutrition Assessments: Nutrition Assessments - 05/19/17 1402      MEDFICTS Scores   Pre Score  15       Nutrition Goals Re-Evaluation: Nutrition Goals Re-Evaluation    Row Name 06/16/17 1030 06/23/17 0905 06/23/17 1047         Goals   Current Weight  -  -  172 lb (78 kg)     Nutrition Goal  Increase your daily fluid intake with an ultimate goal of 64oz/day, per both bariatric surgery and kidney stone guidelines  Increase your daily fluid intake with an ultimate goal of 64oz/day, per both bariatric surgery and kidney stone guidelines  Increase your daily fluid intake with an ultimate goal of 64oz/day, per both bariatric surgery and kidney stone guidelines, Boost, calcuim supplement     Comment  She tries to carry a watter bottle with her throughout the day but per her report does not meet her daily fluid goals. She has developed kidney stones, likely  d/t under-hydration post-bariatric surgery  -  [name] has been doing well with her weight.  She says that she gets plenty of fluids and still eats about 6 small meals a day.  She still does not eat very much.  She is taking Boost daily to increase her calories and protein intake.  She does not eat meat and has been trying to eat more beans.  She is also taking her calcium supplement.      Expected Outcome  She will consume 64oz of fluid per day ideally - or two and a half of her typically-used water bottle  -  Short: Continue to to try to eat more protein.  Long: Continue to heart healthy.        Personal Goal #2 Re-Evaluation   Personal Goal #2  Increase intake of Boost supplements to once every other day at minimum, once daily ideally to increase your protein intake  -  -       Personal Goal #3 Re-Evaluation   Personal Goal #3  Start taking a calcium supplement daily  -  -        Nutrition Goals Discharge (Final Nutrition Goals Re-Evaluation): Nutrition Goals Re-Evaluation - 06/23/17 1047      Goals   Current Weight  172 lb (78 kg)    Nutrition Goal  Increase your daily fluid intake with an ultimate goal of 64oz/day, per both bariatric surgery and kidney stone guidelines, Boost, calcuim supplement    Comment  [name] has been doing well with her weight.  She says that she gets plenty of fluids and still eats about 6 small meals a day.  She still does not eat very much.  She is taking Boost daily to increase her calories and protein intake.  She does not eat meat and has been trying to eat more beans.  She is also taking her calcium supplement.     Expected Outcome  Short: Continue to to try to eat more protein.  Long: Continue to heart healthy.        Psychosocial: Target Goals: Acknowledge presence or absence of significant depression and/or stress, maximize coping skills, provide positive support system. Participant is able to verbalize types and ability to use techniques and skills needed for  reducing stress and depression.   Initial Review & Psychosocial Screening: Initial Psych Review & Screening - 05/19/17 1405      Initial Review   Current issues with  Current Sleep Concerns;Current Stress Concerns she feels like she sleeps too much, taking afternoon naps and then going to bed early    Source of Stress Concerns  Unable to perform yard/household activities;Unable to participate in former interests or hobbies      [place]?  Yes family members      Barriers   Psychosocial barriers to participate in program  The patient should benefit from training in stress management and relaxation.;There are no identifiable barriers or psychosocial needs.      Screening Interventions   Interventions  Encouraged to exercise;Provide feedback about the scores to participant;Program counselor consult;To provide support and resources with identified psychosocial needs    Expected Outcomes  Short Term goal: Utilizing psychosocial counselor, staff and physician to assist with identification of specific Stressors or current issues interfering with healing process. Setting desired goal for each stressor or current issue identified.;Long Term Goal: Stressors or current issues are controlled or eliminated.;Short Term goal: Identification and review with participant of any Quality of Life or Depression concerns found by scoring the questionnaire.;Long Term goal: The participant improves quality of Life and PHQ9 Scores as seen by post scores and/or verbalization of changes       Quality of Life Scores:  Quality of Life - 05/19/17 1406      Quality of Life Scores   Health/Function Pre  17.04 %    Socioeconomic Pre  26 %    Psych/Spiritual Pre  27.43 %    Family Pre  24 %    GLOBAL Pre  22.02 %      Scores of 19 and below usually indicate a poorer quality of life in these areas.  A difference of  2-3 points is a clinically meaningful difference.  A difference of 2-3  points in the total score of the Quality of Life Index has been associated with significant improvement in overall quality of life, self-image, physical symptoms, and general health in studies assessing change in quality of life.  PHQ-9: Recent Review Flowsheet Data    Depression screen [facility] 2/9 06/09/2017 05/19/2017 12/15/2015   Decreased Interest 0 0 0   Down, Depressed, Hopeless 0 0 0   PHQ - 2 Score 0 0 0   Altered sleeping 0 2 3    Tired, decreased energy _0 Change in appetite 2 2 0   Feeling bad or failure about yourself  0 0 0   Trouble concentrating 1 1 0   Moving slowly or fidgety/restless 1 1 0   Suicidal thoughts 0 0 0   PHQ-9 Score _1 Interpretation of Total Score  Total Score Depression Severity:  1-4 =  Minimal depression, 5-9 = Mild depression, 10-14 = Moderate depression, 15-19 = Moderately severe depression, 20-27 = Severe depression   Psychosocial Evaluation and Intervention: Psychosocial Evaluation - 06/09/17 0943      Psychosocial Evaluation & Interventions   Interventions  Encouraged to exercise with the program and follow exercise prescription    Comments  Counselor met with Ms. Lea [name]) today for initial psychosocial evaluation.  She is a 77 year old who had a heart attack and (2) stents inserted approximately one month ago.  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  She has a strong support system with a son locally and active involvement in her local church.  She sleeps well and has a limited appetite due to lap band surgery 10 years ago wherein she lost over 100 lbs since then.  She denies a history of depression or anxiety or any current symptoms and her mood is generally positive.  She has minimal stress in her life other than her health.   [name] has goals to walk without her walker and have a stronger heart.  Staff will follow with her.      Expected Outcomes  Short:  [name] will  exercise to be able to walk without the walker.  Long:  [name] will exercise consistently to improve her stamina and strength.      Continue Psychosocial Services   Follow up required by staff       Psychosocial Re-Evaluation: Psychosocial Re-Evaluation    [place] Name 06/23/17 1121 07/19/17 1043           Psychosocial Re-Evaluation   Current issues with  None Identified  Current Stress Concerns      Comments  [name] is doing well mentally.  She has very little stress, primarily her health.  She does her best to stay even keeled.  The best part has been that she is starting to feel better overall!  She enjoys coming to class.   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  Counselor processed this with [name] and provided supportive services.  She was tearful and reports relying on her family, friends, her dog and her brother to help her through this. She will be meeting with her cancer team in August for recommendations for treatment and [name] reports she is not in favor of chemo again - since she went through this in 2015 and it was terrible.  [name] is encouraged to practice positive self care and is commended that she reports she is "not a quitter."  She plans to live each day to the fullest and counselor.  [name] reports exercising in this program takes a lot of her energy and it requires 24 hours for her to recover her energy.  Counselor encouraged [name] to speak to her Dr. about an alternate program - possibly for cancer patients.  [name] will be followed while in this program.      Expected Outcomes  Short: Continue to attend rehab.  Long: Continue to stay postive.   Short:  [name] will speak to her Dr. about an alternate program to conserve her energy during this new diagnosis.  She will also maintain a positive and determined attitude.  Long:  [name] will continue to exercise and practice positive self-care while facing her new diagnosis and  possible treatment.        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  Counselor processed this with [name] and provided supportive services.  She was tearful and reports relying on her family, friends, her dog and her brother to help her through this. She will be meeting with her cancer team in August for recommendations for treatment and [name] reports she is not in favor of chemo again - since she went through this in 2015 and it was terrible.  [name] is encouraged to practice positive self care and is commended that she reports she is "not a quitter."  She plans to live each day to the fullest and counselor.  [name] reports exercising in this program takes a lot of her energy and it requires 24 hours for her to recover her energy.  Counselor encouraged [name] to speak to her Dr. about an alternate program - possibly for cancer patients.  [name] will be followed while in this program.    Expected Outcomes  Short:  [name] will speak to her Dr. about an alternate program to conserve her energy during this new diagnosis.  She will also maintain a positive and determined attitude.  Long:  [name] will continue to exercise and practice positive self-care while facing her new diagnosis and possible treatment.      Interventions  Stress management education;Relaxation education    Continue Psychosocial Services   Follow up required by staff       Vocational Rehabilitation: Provide vocational rehab assistance to qualifying  candidates.   Vocational Rehab Evaluation & Intervention: Vocational Rehab - 05/19/17 1415      Initial Vocational Rehab Evaluation & Intervention   Assessment shows need for Vocational Rehabilitation  No       Education: Education Goals: Education classes will be provided on a variety of topics geared toward better understanding of heart health and risk factor modification. Participant will state understanding/return demonstration of topics presented as noted by education test scores.  Learning Barriers/Preferences: Learning Barriers/Preferences - 05/19/17 1410      Learning Barriers/Preferences   Learning Barriers  None    Learning Preferences  Individual Instruction;Video       Education Topics:  AED/CPR: - Group verbal and written instruction with the use of models to demonstrate the basic use of the AED with the basic ABC's of resuscitation.   General Nutrition Guidelines/Fats and Fiber: -Group instruction provided by verbal, written material, models and posters to present the general guidelines for heart healthy nutrition. Gives an explanation and review of dietary fats and fiber.   Cardiac Rehab from 07/26/2017 in [facility] Cardiac and Pulmonary Rehab  Date  07/26/17  Educator  CR  Instruction Review Code  1- Verbalizes Understanding      Controlling Sodium/Reading Food Labels: -Group verbal and written material supporting the discussion of sodium use in heart healthy nutrition. Review and explanation with models, verbal and written materials for utilization of the food label.   Cardiac Rehab from 07/26/2017 in [facility] Cardiac and Pulmonary Rehab  Date  06/07/17  Educator  PI  Instruction Review Code  1- Verbalizes Understanding      Exercise Physiology & General Exercise Guidelines: - Group verbal and written instruction with models to review the exercise physiology of the cardiovascular system and associated critical values. Provides general exercise guidelines with  specific guidelines to those with heart or lung disease.    Cardiac Rehab from 07/26/2017 in [facility] Cardiac and Pulmonary Rehab  Date  06/14/17  Educator  AS  Instruction Review Code  1- Verbalizes Understanding      Aerobic Exercise & Resistance Training: - Gives group verbal and written instruction on the various components of exercise. Focuses on aerobic and resistive training programs and the benefits of this training and how to safely progress through these programs..   Flexibility, Balance, Mind/Body Relaxation: Provides group verbal/written instruction on the benefits of flexibility and balance training, including mind/body exercise modes such as yoga, pilates and tai chi.  Demonstration and skill practice provided.   Cardiac Rehab from 07/26/2017 in [facility] Cardiac and Pulmonary Rehab  Date  06/30/17  Educator  AS  Instruction Review Code  1- Verbalizes Understanding      Stress and Anxiety: - Provides group verbal and written instruction about the health risks of elevated stress and causes of high stress.  Discuss the correlation between heart/lung disease and anxiety and treatment options. Review healthy ways to manage with stress and anxiety.   Cardiac Rehab from 07/26/2017 in [facility] Cardiac and Pulmonary Rehab  Date  07/19/17  Educator  [facility]  Instruction Review Code  1- Verbalizes Understanding      Depression: - Provides group verbal and written instruction on the correlation between heart/lung disease and depressed mood, treatment options, and the stigmas associated with seeking treatment.   Anatomy & Physiology of the Heart: - Group verbal and written instruction and models provide basic cardiac anatomy and physiology, with the coronary electrical and arterial systems. Review of Valvular disease and Heart Failure   Cardiac Rehab from 07/26/2017 in [facility] Cardiac and Pulmonary Rehab  Date  07/21/17  Educator  SB  Instruction Review Code  1- Verbalizes Understanding       Cardiac Procedures: - Group verbal and written instruction to review commonly prescribed medications for heart disease. Reviews the medication, class of the drug, and side effects. Includes the steps to properly store meds and maintain the prescription regimen. (beta blockers and nitrates)   Cardiac Medications I: - Group verbal and written instruction to review commonly prescribed medications for heart disease. Reviews the medication, class of the drug, and side effects. Includes the steps to properly store meds and maintain the prescription regimen.   Cardiac Rehab from 07/26/2017 in [facility] Cardiac and Pulmonary Rehab  Date  07/05/17  Educator  SB  Instruction Review Code  1- Verbalizes Understanding      Cardiac Medications II: -Group verbal and written instruction to review commonly prescribed medications for heart disease. Reviews the medication, class of the drug, and side effects. (all other drug classes)   Cardiac Rehab from 07/26/2017 in [facility] Cardiac and Pulmonary Rehab  Date  06/23/17  Educator  CE  Instruction Review Code  1- Verbalizes Understanding       Go Sex-Intimacy & Heart Disease, Get SMART - Goal Setting: - Group verbal and written instruction through game format to discuss heart disease and the return to sexual intimacy. Provides group verbal and written material to discuss and apply goal setting through the application of the S.M.A.R.T. Method.  Other Matters of the Heart: - Provides group verbal, written materials and models to describe Stable Angina and Peripheral Artery. Includes description of the disease process and treatment options available to the cardiac patient.   Cardiac Rehab from 07/26/2017 in [facility] Cardiac and Pulmonary Rehab  Date  07/21/17  Educator  SB  Instruction Review Code  1- Verbalizes Understanding      Exercise & Equipment Safety: - Individual verbal instruction and demonstration of equipment use and safety with use of the  equipment.   Cardiac Rehab from 07/26/2017 in [facility] Cardiac and Pulmonary Rehab  Date  05/19/17  Educator  [facility]  Instruction Review Code  1- Verbalizes Understanding      Infection Prevention: - Provides verbal and written material to individual with discussion of infection control including proper hand washing and proper equipment cleaning during exercise session.   Cardiac Rehab from 07/26/2017 in [facility] Cardiac and Pulmonary Rehab  Date  05/19/17  Educator  [facility]  Instruction Review Code  1- Verbalizes Understanding      Falls Prevention: - Provides verbal and written material to individual with discussion of falls prevention and safety.   Cardiac Rehab from 07/26/2017 in [facility] Cardiac and Pulmonary Rehab  Date  05/19/17  Educator  [facility]  Instruction Review Code (retired)  1- partially meets, needs review/practice      Diabetes: - Individual verbal and written instruction to review signs/symptoms of diabetes, desired ranges of glucose level fasting, after meals and with exercise. Acknowledge that pre and post exercise glucose checks will be done for 3 sessions at entry of program.   Cardiac Rehab from 07/26/2017 in [facility] Cardiac and Pulmonary Rehab  Date  05/19/17  Educator  [facility]  Instruction Review Code  1- Verbalizes Understanding      Know Your Numbers and Risk Factors: -Group verbal and written instruction about important numbers in your health.  Discussion of what are risk factors and how they play a role in the disease process.  Review of Cholesterol, Blood Pressure, Diabetes, and BMI and the role they play in your overall health.   Cardiac Rehab from 07/26/2017 in [facility] Cardiac and Pulmonary Rehab  Date  06/23/17  Educator  CE  Instruction Review Code  1- Verbalizes Understanding      Sleep Hygiene: -Provides group verbal and written instruction about how sleep can affect your health.  Define sleep hygiene, discuss sleep cycles and impact of sleep habits. Review good sleep hygiene  tips.    Other: -Provides group and verbal instruction on various topics (see comments)   Knowledge Questionnaire Score: Knowledge Questionnaire Score - 05/19/17 1415      Knowledge Questionnaire Score   Pre Score  21/28 correct answers reviewed with [name]       Core Components/Risk Factors/Patient Goals at Admission: Personal Goals and Risk Factors at Admission - 05/19/17 1350      Core Components/Risk Factors/Patient Goals on Admission    Weight Management  Obesity;Yes;Weight Loss    Intervention  Weight Management: Develop a combined nutrition and exercise program designed to reach desired caloric intake, while maintaining appropriate intake of nutrient and fiber, sodium and fats, and appropriate energy expenditure required for the weight goal.;Weight Management: Provide education and appropriate resources to help participant work on and attain dietary goals.;Weight Management/Obesity: Establish reasonable short term and long term weight goals.;Obesity: Provide education and appropriate resources to help participant work on and attain dietary goals.    Admit Weight  166 lb (75.3 kg)  Goal Weight: Short Term  162 lb (73.5 kg)    Goal Weight: Long Term  150 lb (68 kg)    Expected Outcomes  Short Term: Continue to assess and modify interventions until short term weight is achieved;Long Term: Adherence to nutrition and physical activity/exercise program aimed toward attainment of established weight goal;Weight Loss: Understanding of general recommendations for a balanced deficit meal plan, which promotes 1-2 lb weight loss per week and includes a negative energy balance of [phone] kcal/d;Understanding recommendations for meals to include 15-35% energy as protein, 25-35% energy from fat, 35-60% energy from carbohydrates, less than '200mg'$  of dietary cholesterol, 20-35 gm of total fiber daily;Understanding of distribution of calorie intake throughout the day with the consumption of 4-5  meals/snacks    Diabetes  Yes    Intervention  Provide education about signs/symptoms and action to take for hypo/hyperglycemia.;Provide education about proper nutrition, including hydration, and aerobic/resistive exercise prescription along with prescribed medications to achieve blood glucose in normal ranges: Fasting glucose 65-99 mg/dL    Expected Outcomes  Short Term: Participant verbalizes understanding of the signs/symptoms and immediate care of hyper/hypoglycemia, proper foot care and importance of medication, aerobic/resistive exercise and nutrition plan for blood glucose control.;Long Term: Attainment of HbA1C < 7%.    Heart Failure  Yes    Intervention  Provide a combined exercise and nutrition program that is supplemented with education, support and counseling about heart failure. Directed toward relieving symptoms such as shortness of breath, decreased exercise tolerance, and extremity edema.    Expected Outcomes  Improve functional capacity of life;Short term: Attendance in program 2-3 days a week with increased exercise capacity. Reported lower sodium intake. Reported increased fruit and vegetable intake. Reports medication compliance.;Short term: Daily weights obtained and reported for increase. Utilizing diuretic protocols set by physician.;Long term: Adoption of self-care skills and reduction of barriers for early signs and symptoms recognition and intervention leading to self-care maintenance.    Hypertension  Yes    Intervention  Provide education on lifestyle modifcations including regular physical activity/exercise, weight management, moderate sodium restriction and increased consumption of fresh fruit, vegetables, and low fat dairy, alcohol moderation, and smoking cessation.;Monitor prescription use compliance.    Expected Outcomes  Short Term: Continued assessment and intervention until BP is < 140/79m HG in hypertensive participants. < 130/882mHG in hypertensive participants with  diabetes, heart failure or chronic kidney disease.;Long Term: Maintenance of blood pressure at goal levels.    Lipids  Yes    Intervention  Provide education and support for participant on nutrition & aerobic/resistive exercise along with prescribed medications to achieve LDL '70mg'$ , HDL >'40mg'$ .    Expected Outcomes  Short Term: Participant states understanding of desired cholesterol values and is compliant with medications prescribed. Participant is following exercise prescription and nutrition guidelines.;Long Term: Cholesterol controlled with medications as prescribed, with individualized exercise RX and with personalized nutrition plan. Value goals: LDL < '70mg'$ , HDL > 40 mg.       Core Components/Risk Factors/Patient Goals Review:  Goals and Risk Factor Review    Row Name 06/23/17 1041 07/26/17 0824           Core Components/Risk Factors/Patient Goals Review   Personal Goals Review  Weight Management/Obesity;Hypertension;Diabetes;Lipids;Heart Failure  Hypertension;Diabetes;Lipids      Review  Judy's weight has been pretty steady around 172 lbs.  She did gain some over the last two weeks as her bowels were not working properly and now after getting a medication to help she is  having loose stools.  She is going to keep an eye on this and let her doctor know if it continues.  Her blood pressures have been good and she checks them at home 2-3 times a week.  She is doing well on her medications and has not had any heart failure symptoms.    [name] is taking all meds as directed.  Her BG and BP have been good at home.  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  We discussed using ankle circles to keep circulation going and keep as much function as possible.  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  She should graduate HT by then.          Expected Outcomes  Short: Continue to work monitor heart failure symptoms.  Long: Continue to work on weight loss.   Short - continue to attend until  ready to grduate HT  Long - maintain exercise and dietary habits to keep heart healthy         Core Components/Risk Factors/Patient Goals at Discharge (Final Review):  Goals and Risk Factor Review - 07/26/17 0824      Core Components/Risk Factors/Patient Goals Review   Personal Goals Review  Hypertension;Diabetes;Lipids    Review  [name] is taking all meds as directed.  Her BG and BP have been good at home.  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  We discussed using ankle circles to keep circulation going and keep as much function as possible.  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  She should graduate HT by then.        Expected Outcomes  Short - continue to attend until ready to grduate HT  Long - maintain exercise and dietary habits to keep heart healthy       ITP Comments: ITP Comments    Row Name 05/19/17 1328 05/25/17 1535 06/01/17 0614 06/29/17 0614 07/19/17 0820   ITP Comments  Med review completed. Initial ITP created. Diagnosis can be found in [facility] 4/9  [name] called to leave a message about not coming to class. She was in the ED yesterday and determined to have a kidney stone.  She is on a new medication to help and has a follow up appointment with radiology on Friday. She is going to stay out until she is able to pass the stone or relieve the pain.  [name] will keep [place] up to date with what is going on with her.   30 day review. Continue with ITP unless directed changes per Medical Director  New to program   Med review only  30 day review. Continue with ITP unless directed changes per Medical Director review   Only 3 visits in June  Judy informed staff today that she has been diagnosed with CLL.  She sees her Dr in 2 weeks to follow up.  Staff will work with [name] once she knows her treatment plan to move forward.   [place] Name 07/27/17 0549           ITP Comments  30 day review. Continue with ITP unless directed changes per Medical Director review.  [name] plans to  complete program before starting oncology treatment plan          Comments: 30 day review. Continue with ITP unless directed changes per Medical Director review.

## 2017-07-28 ENCOUNTER — Encounter: Payer: Medicare Other | Admitting: *Deleted

## 2017-07-28 DIAGNOSIS — Z955 Presence of coronary angioplasty implant and graft: Secondary | ICD-10-CM

## 2017-07-28 DIAGNOSIS — E11621 Type 2 diabetes mellitus with foot ulcer: Secondary | ICD-10-CM | POA: Diagnosis not present

## 2017-07-28 DIAGNOSIS — I214 Non-ST elevation (NSTEMI) myocardial infarction: Secondary | ICD-10-CM

## 2017-07-28 DIAGNOSIS — Z951 Presence of aortocoronary bypass graft: Secondary | ICD-10-CM

## 2017-07-28 NOTE — Progress Notes (Signed)
Daily Session Note  Patient Details  Name: [name] MRN: [id number] Date of Birth: 05/04/1940 Referring Provider:     Cardiac Rehab from 05/19/2017 in [facility]  Referring Provider  [facility]      Encounter Date: 07/28/2017  Check In: Session Check In - 07/28/17 0907      Check-In   Staff Present  Susanne Bice, RN, BSN, CCRP;Jessica Hawkins, MA, RCEP, CCRP, Exercise Physiologist;Mandi , BS, PEC    Supervising physician immediately available to respond to emergencies  See telemetry face sheet for immediately available ER MD    Medication changes reported      No    Fall or balance concerns reported     No    Tobacco Cessation  No Change    Warm-up and Cool-down  Performed on first and last piece of equipment    Resistance Training Performed  Yes    VAD Patient?  No    PAD/SET Patient?  No      Pain Assessment   Currently in Pain?  No/denies    Multiple Pain Sites  No          Social History   Tobacco Use  Smoking Status Never Smoker  Smokeless Tobacco Never Used    Goals Met:  Independence with exercise equipment Exercise tolerated well No report of cardiac concerns or symptoms Strength training completed today  Goals Unmet:  Not Applicable  Comments: Pt able to follow exercise prescription today without complaint.  Will continue to monitor for progression.    Dr. Mark Miller is Medical Director for [facility] and [facility]. 

## 2017-08-02 DIAGNOSIS — Z955 Presence of coronary angioplasty implant and graft: Secondary | ICD-10-CM

## 2017-08-02 DIAGNOSIS — I214 Non-ST elevation (NSTEMI) myocardial infarction: Secondary | ICD-10-CM

## 2017-08-02 DIAGNOSIS — E11621 Type 2 diabetes mellitus with foot ulcer: Secondary | ICD-10-CM | POA: Diagnosis not present

## 2017-08-02 DIAGNOSIS — Z951 Presence of aortocoronary bypass graft: Secondary | ICD-10-CM

## 2017-08-02 NOTE — Progress Notes (Signed)
Daily Session Note  Patient Details  Name: [name] MRN: [id number] Date of Birth: Feb 17, 1940 Referring Provider:     Cardiac Rehab from 05/19/2017 in [facility]  Referring Provider  [facility]      Encounter Date: 08/02/2017  Check In: Session Check In - 08/02/17 0849      Check-In   Location  [facility]    Staff Present  Joellyn Rued, BS, [place];Nada Maclachlan, BA, ACSM CEP, Exercise Physiologist;Susanne Bice, RN, BSN, CCRP    Supervising physician immediately available to respond to emergencies  See telemetry face sheet for immediately available ER MD    Medication changes reported      No    Fall or balance concerns reported     No    Warm-up and Cool-down  Performed on first and last piece of equipment    Resistance Training Performed  Yes    VAD Patient?  No    PAD/SET Patient?  No      Pain Assessment   Currently in Pain?  No/denies    Multiple Pain Sites  No          Social History   Tobacco Use  Smoking Status Never Smoker  Smokeless Tobacco Never Used    Goals Met:  Independence with exercise equipment Exercise tolerated well No report of cardiac concerns or symptoms Strength training completed today  Goals Unmet:  Not Applicable  Comments: Pt able to follow exercise prescription today without complaint.  Will continue to monitor for progression.    Dr. Emily Filbert is Medical Director for [place] and [facility].

## 2017-08-04 DIAGNOSIS — I214 Non-ST elevation (NSTEMI) myocardial infarction: Secondary | ICD-10-CM

## 2017-08-04 DIAGNOSIS — Z955 Presence of coronary angioplasty implant and graft: Secondary | ICD-10-CM

## 2017-08-04 DIAGNOSIS — Z951 Presence of aortocoronary bypass graft: Secondary | ICD-10-CM

## 2017-08-04 DIAGNOSIS — E11621 Type 2 diabetes mellitus with foot ulcer: Secondary | ICD-10-CM | POA: Diagnosis not present

## 2017-08-04 NOTE — Progress Notes (Signed)
Daily Session Note  Patient Details  Name: [name] MRN: [id number] Date of Birth: 31-Jul-1940 Referring Provider:     Cardiac Rehab from 05/19/2017 in [facility] Re[facility]  Referring Provider  [name]      Encounter Date: 08/04/2017  Check In: Session Check In - 08/04/17 0820      Check-In   Location  [facility]    Staff Present  Joellyn Rued, BS, PEC;Carroll Enterkin, RN, BSN;Jessica [place], MA, RCEP, CCRP, Exercise Physiologist;Timberlee Roblero Oletta Darter, BA, ACSM CEP, Exercise Physiologist    Supervising physician immediately available to respond to emergencies  See telemetry face sheet for immediately available ER MD    Medication changes reported      No    Fall or balance concerns reported     No    Tobacco Cessation  No Change    Warm-up and Cool-down  Performed on first and last piece of equipment    Resistance Training Performed  Yes    VAD Patient?  No    PAD/SET Patient?  No      Pain Assessment   Currently in Pain?  No/denies    Multiple Pain Sites  No        Exercise Prescription Changes - 08/03/17 1400      Response to Exercise   Blood Pressure (Admit)  104/56    Blood Pressure (Exercise)  134/56    Blood Pressure (Exit)  112/58    Heart Rate (Admit)  67 bpm    Heart Rate (Exercise)  91 bpm    Heart Rate (Exit)  68 bpm    Rating of Perceived Exertion (Exercise)  13    Symptoms  none    Duration  Continue with 30 min of aerobic exercise without signs/symptoms of physical distress.    Intensity  THRR unchanged      Progression   Progression  Continue to progress workloads to maintain intensity without signs/symptoms of physical distress.    Average METs  2.09      Resistance Training   Training Prescription  Yes    Weight  2 lbs    Reps  10-15      Interval Training   Interval Training  No      NuStep   Level  1    Minutes  15    METs  1.8      Track   Laps  30    Minutes  15    METs  2.38      Home Exercise  Plan   Plans to continue exercise at  Home (comment) walking and walking in pool    Frequency  Add 2 additional days to program exercise sessions.    Initial Home Exercises Provided  06/30/17       Social History   Tobacco Use  Smoking Status Never Smoker  Smokeless Tobacco Never Used    Goals Met:  Independence with exercise equipment Exercise tolerated well Personal goals reviewed No report of cardiac concerns or symptoms Strength training completed today  Goals Unmet:  Not Applicable  Comments:  [name] Name 05/19/17 1342 08/04/17 0837       6 Minute Walk   Phase  -  Discharge    Distance  733 feet  730 feet    Distance % Change  -  0.99 %    Distance Feet Change  -  3 ft    Walk  Time  5.5 minutes  6 minutes    # of Rest Breaks  1  0    MPH  1.4  1.38    METS  1.33  1.4    RPE  10  14    Perceived Dyspnea   3  3    VO2 Peak  4.67  4.9    Symptoms  Yes (comment)  Yes (comment)    Comments  Short of breath - 3 on SOB scale  Cramps in right leg, SOB     Resting HR  57 bpm  72 bpm    Resting BP  122/54  112/54    Resting Oxygen Saturation   98 %  98 %    Exercise Oxygen Saturation  during 6 min walk  94 %  99 %    Max Ex. HR  94 bpm  104 bpm    Max Ex. BP  136/62  134/50    2 Minute Post BP  122/56  124/52      Pt able to follow exercise prescription today without complaint.  Will continue to monitor for progression.    Dr. Emily Filbert is Medical Director for [name] and [facility].

## 2017-08-11 ENCOUNTER — Encounter: Payer: Medicare Other | Attending: Internal Medicine | Admitting: *Deleted

## 2017-08-11 DIAGNOSIS — I110 Hypertensive heart disease with heart failure: Secondary | ICD-10-CM | POA: Diagnosis not present

## 2017-08-11 DIAGNOSIS — I509 Heart failure, unspecified: Secondary | ICD-10-CM | POA: Insufficient documentation

## 2017-08-11 DIAGNOSIS — L97521 Non-pressure chronic ulcer of other part of left foot limited to breakdown of skin: Secondary | ICD-10-CM | POA: Diagnosis present

## 2017-08-11 DIAGNOSIS — I252 Old myocardial infarction: Secondary | ICD-10-CM | POA: Diagnosis not present

## 2017-08-11 DIAGNOSIS — Z951 Presence of aortocoronary bypass graft: Secondary | ICD-10-CM | POA: Diagnosis not present

## 2017-08-11 DIAGNOSIS — E1151 Type 2 diabetes mellitus with diabetic peripheral angiopathy without gangrene: Secondary | ICD-10-CM | POA: Insufficient documentation

## 2017-08-11 DIAGNOSIS — I2510 Atherosclerotic heart disease of native coronary artery without angina pectoris: Secondary | ICD-10-CM | POA: Insufficient documentation

## 2017-08-11 DIAGNOSIS — Z880 Allergy status to penicillin: Secondary | ICD-10-CM | POA: Insufficient documentation

## 2017-08-11 DIAGNOSIS — E1142 Type 2 diabetes mellitus with diabetic polyneuropathy: Secondary | ICD-10-CM | POA: Diagnosis not present

## 2017-08-11 DIAGNOSIS — Z7901 Long term (current) use of anticoagulants: Secondary | ICD-10-CM | POA: Diagnosis not present

## 2017-08-11 DIAGNOSIS — D649 Anemia, unspecified: Secondary | ICD-10-CM | POA: Insufficient documentation

## 2017-08-11 DIAGNOSIS — E11621 Type 2 diabetes mellitus with foot ulcer: Secondary | ICD-10-CM | POA: Diagnosis not present

## 2017-08-11 DIAGNOSIS — Z955 Presence of coronary angioplasty implant and graft: Secondary | ICD-10-CM

## 2017-08-11 DIAGNOSIS — Z7982 Long term (current) use of aspirin: Secondary | ICD-10-CM | POA: Diagnosis not present

## 2017-08-11 DIAGNOSIS — C9000 Multiple myeloma not having achieved remission: Secondary | ICD-10-CM | POA: Insufficient documentation

## 2017-08-11 DIAGNOSIS — I214 Non-ST elevation (NSTEMI) myocardial infarction: Secondary | ICD-10-CM

## 2017-08-11 NOTE — Progress Notes (Signed)
Discharge Progress Report  Patient Details  Name: [name] MRN: [id number] Date of Birth: [birth year] Referring Provider:     Cardiac Rehab from 05/19/2017 in [facility]  Referring Provider  [facility]       Number of Visits: 29  Reason for Discharge:  Patient reached a stable level of exercise. Patient independent in their exercise. Patient has met program and personal goals.  Smoking History:  Social History   Tobacco Use  Smoking Status Never Smoker  Smokeless Tobacco Never Used    Diagnosis:  NSTEMI (non-ST elevated myocardial infarction) ([place])  Status post coronary artery stent placement  ADL [facility]:   Initial Exercise Prescription: Initial Exercise Prescription - 05/19/17 1300      Date of Initial Exercise RX and Referring Provider   Date  05/19/17    Referring Provider  [facility]      NuStep   Level  1    SPM  80    Minutes  15    METs  1.3      Arm Ergometer   Level  1    Minutes  15    METs  1.3      Track   Laps  11    Minutes  15    METs  1.5      Prescription Details   Frequency (times per week)  2    Duration  Progress to 30 minutes of continuous aerobic without signs/symptoms of physical distress      Intensity   THRR 40-80% of Max Heartrate  92-127    Ratings of Perceived Exertion  11-13    Perceived Dyspnea  0-4      Resistance Training   Training Prescription  Yes    Weight  2 lb    Reps  10-15       Discharge Exercise Prescription (Final Exercise Prescription Changes): Exercise Prescription Changes - 08/03/17 1400      Response to Exercise   Blood Pressure (Admit)  104/56    Blood Pressure (Exercise)  134/56    Blood Pressure (Exit)  112/58    Heart Rate (Admit)  67 bpm    Heart Rate (Exercise)  91 bpm    Heart Rate (Exit)  68 bpm    Rating of Perceived Exertion (Exercise)  13    Symptoms  none    Duration  Continue with 30 min of aerobic exercise without signs/symptoms of physical distress.    Intensity  THRR unchanged      Progression   Progression  Continue to progress workloads to maintain intensity without signs/symptoms of physical distress.    Average METs  2.09      Resistance Training   Training Prescription  Yes    Weight  2 lbs    Reps  10-15      Interval Training   Interval Training  No      NuStep   Level  1    Minutes  15    METs  1.8      Track   Laps  30    Minutes  15    METs  2.38      Home Exercise Plan   Plans to continue exercise at  Home (comment) walking and walking in pool    Frequency  Add 2 additional days to program exercise sessions.    Initial Home Exercises Provided  06/30/17       Functional Capacity: 6 Minute  Walk    Row Name 05/19/17 1342 08/04/17 0837       6 Minute Walk   Phase  -  Discharge    Distance  733 feet  730 feet    Distance % Change  -  0.99 %    Distance Feet Change  -  3 ft    Walk Time  5.5 minutes  6 minutes    # of Rest Breaks  1  0    MPH  1.4  1.38    METS  1.33  1.4    RPE  10  14    Perceived Dyspnea   3  3    VO2 Peak  4.67  4.9    Symptoms  Yes (comment)  Yes (comment)    Comments  Short of breath - 3 on SOB scale  Cramps in right leg, SOB     Resting HR  57 bpm  72 bpm    Resting BP  122/54  112/54    Resting Oxygen Saturation   98 %  98 %    Exercise Oxygen Saturation  during 6 min walk  94 %  99 %    Max Ex. HR  94 bpm  104 bpm    Max Ex. BP  136/62  134/50    2 Minute Post BP  122/56  124/52       Psychological, QOL, Others - Outcomes: PHQ 2/9: Depression screen [facility] 2/9 08/11/2017 06/09/2017 05/19/2017 12/15/2015  Decreased Interest 2 0 0 0  Down, Depressed, Hopeless 0 0 0 0  PHQ - 2 Score 2 0 0 0  Altered sleeping 0 0 2 3  Tired, decreased energy 3 2 2 2   Change in appetite 3 2 2  0  Feeling bad or failure about yourself  0 0 0 0  Trouble concentrating 0 1 1 0  Moving slowly or fidgety/restless 1 1 1  0  Suicidal thoughts 0 0 0 0  PHQ-9 Score 9 6 8 5   Difficult doing work/chores  Somewhat difficult - - -    Quality of Life: Quality of Life - 08/11/17 0848      Quality of Life   Select  Quality of Life      Quality of Life Scores   Health/Function Pre  17.04 %    Health/Function Post  20.77 %    Health/Function % Change  21.89 %    Socioeconomic Pre  26 %    Socioeconomic Post  28.93 %    Socioeconomic % Change   11.27 %    Psych/Spiritual Pre  27.43 %    Psych/Spiritual Post  29.64 %    Psych/Spiritual % Change  8.06 %    Family Pre  24 %    Family Post  17.5 %    Family % Change  -27.08 %    GLOBAL Pre  22.02 %    GLOBAL Post  23.79 %    GLOBAL % Change  8.04 %       Personal Goals: Goals established at orientation with interventions provided to work toward goal. Personal Goals and Risk Factors at Admission - 05/19/17 1350      Core Components/Risk Factors/Patient Goals on Admission    Weight Management  Obesity;Yes;Weight Loss    Intervention  Weight Management: Develop a combined nutrition and exercise program designed to reach desired caloric intake, while maintaining appropriate intake of nutrient and fiber, sodium and fats, and appropriate energy expenditure  required for the weight goal.;Weight Management: Provide education and appropriate resources to help participant work on and attain dietary goals.;Weight Management/Obesity: Establish reasonable short term and long term weight goals.;Obesity: Provide education and appropriate resources to help participant work on and attain dietary goals.    Admit Weight  166 lb (75.3 kg)    Goal Weight: Short Term  162 lb (73.5 kg)    Goal Weight: Long Term  150 lb (68 kg)    Expected Outcomes  Short Term: Continue to assess and modify interventions until short term weight is achieved;Long Term: Adherence to nutrition and physical activity/exercise program aimed toward attainment of established weight goal;Weight Loss: Understanding of general recommendations for a balanced deficit meal plan, which promotes  1-2 lb weight loss per week and includes a negative energy balance of [phone] kcal/d;Understanding recommendations for meals to include 15-35% energy as protein, 25-35% energy from fat, 35-60% energy from carbohydrates, less than 228m of dietary cholesterol, 20-35 gm of total fiber daily;Understanding of distribution of calorie intake throughout the day with the consumption of 4-5 meals/snacks    Diabetes  Yes    Intervention  Provide education about signs/symptoms and action to take for hypo/hyperglycemia.;Provide education about proper nutrition, including hydration, and aerobic/resistive exercise prescription along with prescribed medications to achieve blood glucose in normal ranges: Fasting glucose 65-99 mg/dL    Expected Outcomes  Short Term: Participant verbalizes understanding of the signs/symptoms and immediate care of hyper/hypoglycemia, proper foot care and importance of medication, aerobic/resistive exercise and nutrition plan for blood glucose control.;Long Term: Attainment of HbA1C < 7%.    Heart Failure  Yes    Intervention  Provide a combined exercise and nutrition program that is supplemented with education, support and counseling about heart failure. Directed toward relieving symptoms such as shortness of breath, decreased exercise tolerance, and extremity edema.    Expected Outcomes  Improve functional capacity of life;Short term: Attendance in program 2-3 days a week with increased exercise capacity. Reported lower sodium intake. Reported increased fruit and vegetable intake. Reports medication compliance.;Short term: Daily weights obtained and reported for increase. Utilizing diuretic protocols set by physician.;Long term: Adoption of self-care skills and reduction of barriers for early signs and symptoms recognition and intervention leading to self-care maintenance.    Hypertension  Yes    Intervention  Provide education on lifestyle modifcations including regular physical  activity/exercise, weight management, moderate sodium restriction and increased consumption of fresh fruit, vegetables, and low fat dairy, alcohol moderation, and smoking cessation.;Monitor prescription use compliance.    Expected Outcomes  Short Term: Continued assessment and intervention until BP is < 140/942mHG in hypertensive participants. < 130/8075mG in hypertensive participants with diabetes, heart failure or chronic kidney disease.;Long Term: Maintenance of blood pressure at goal levels.    Lipids  Yes    Intervention  Provide education and support for participant on nutrition & aerobic/resistive exercise along with prescribed medications to achieve LDL <32m74mDL >40mg62m Expected Outcomes  Short Term: Participant states understanding of desired cholesterol values and is compliant with medications prescribed. Participant is following exercise prescription and nutrition guidelines.;Long Term: Cholesterol controlled with medications as prescribed, with individualized exercise RX and with personalized nutrition plan. Value goals: LDL < 32mg,80m > 40 mg.        Personal Goals Discharge: Goals and Risk Factor Review    Row Name 06/23/17 1041 07/26/17 0824           Core Components/Risk Factors/Patient Goals  Review   Personal Goals Review  Weight Management/Obesity;Hypertension;Diabetes;Lipids;Heart Failure  Hypertension;Diabetes;Lipids      Review  Judy's weight has been pretty steady around 172 lbs.  She did gain some over the last two weeks as her bowels were not working properly and now after getting a medication to help she is having loose stools.  She is going to keep an eye on this and let her doctor know if it continues.  Her blood pressures have been good and she checks them at home 2-3 times a week.  She is doing well on her medications and has not had any heart failure symptoms.    Bethena Roys is taking all meds as directed.  Her BG and BP have been good at home.  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  We discussed using ankle circles to keep circulation going and keep as much function as possible.  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  She should graduate HT by then.          Expected Outcomes  Short: Continue to work monitor heart failure symptoms.  Long: Continue to work on weight loss.   Short - continue to attend until ready to grduate HT  Long - maintain exercise and dietary habits to keep heart healthy         Exercise Goals and Review: Exercise Goals    Row Name 05/19/17 1342             Exercise Goals   Increase Physical Activity  Yes       Intervention  Provide advice, education, support and counseling about physical activity/exercise needs.;Develop an individualized exercise prescription for aerobic and resistive training based on initial evaluation findings, risk stratification, comorbidities and participant's personal goals.       Expected Outcomes  Short Term: Attend rehab on a regular basis to increase amount of physical activity.;Long Term: Add in home exercise to make exercise part of routine and to increase amount of physical activity.;Long Term: Exercising regularly at least 3-5 days a week.       Increase Strength and Stamina  Yes       Intervention  Provide advice, education, support and counseling about physical activity/exercise needs.;Develop an individualized exercise prescription for aerobic and resistive training based on initial evaluation findings, risk stratification, comorbidities and participant's personal goals.       Expected Outcomes  Short Term: Increase workloads from initial exercise prescription for resistance, speed, and METs.;Short Term: Perform resistance training exercises routinely during rehab and add in resistance training at home;Long Term: Improve cardiorespiratory fitness, muscular endurance and strength as measured by increased METs and functional capacity (6MWT)       Able to understand and use rate  of perceived exertion (RPE) scale  Yes       Intervention  Provide education and explanation on how to use RPE scale       Expected Outcomes  Short Term: Able to use RPE daily in rehab to express subjective intensity level;Long Term:  Able to use RPE to guide intensity level when exercising independently       Able to understand and use Dyspnea scale  Yes       Intervention  Provide education and explanation on how to use Dyspnea scale       Expected Outcomes  Short Term: Able to use Dyspnea scale daily in rehab to express subjective sense of shortness of breath during exertion;Long  Term: Able to use Dyspnea scale to guide intensity level when exercising independently       Knowledge and understanding of Target Heart Rate Range (THRR)  Yes       Intervention  Provide education and explanation of THRR including how the numbers were predicted and where they are located for reference       Expected Outcomes  Short Term: Able to state/look up THRR;Short Term: Able to use daily as guideline for intensity in rehab;Long Term: Able to use THRR to govern intensity when exercising independently       Able to check pulse independently  Yes       Intervention  Provide education and demonstration on how to check pulse in carotid and radial arteries.;Review the importance of being able to check your own pulse for safety during independent exercise       Expected Outcomes  Short Term: Able to explain why pulse checking is important during independent exercise;Long Term: Able to check pulse independently and accurately       Understanding of Exercise Prescription  Yes       Intervention  Provide education, explanation, and written materials on patient's individual exercise prescription       Expected Outcomes  Short Term: Able to explain program exercise prescription;Long Term: Able to explain home exercise prescription to exercise independently          Nutrition & Weight - Outcomes: Pre Biometrics - 05/19/17  1340      Pre Biometrics   Height  5' 2.5" (1.588 m)    Weight  174 lb 1.6 oz (79 kg)    Waist Circumference  37 inches    Hip Circumference  48 inches    Waist to Hip Ratio  0.77 %    BMI (Calculated)  31.32    Single Leg Stand  1.5 seconds        Nutrition: Nutrition Therapy & Goals - 06/16/17 1010      Nutrition Therapy   Diet  TLC    Protein (specify units)  9    Fiber  25 grams    Whole Grain Foods  3 servings    Saturated Fats  11 max. 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 She is not currently meeting her protein needs and is likely not meeting her fluid needs      Intervention Plan   Intervention  Prescribe, educate and counsel regarding individualized specific dietary modifications aiming towards targeted core components such as weight, hypertension, lipid management, diabetes, heart failure and other comorbidities.;Nutrition handout(s) given to patient. Nutrition therapy for kidney stones, Lifelong dietary management for bariatric surgery handouts provided    Expected Outcomes  Short Term Goal: Understand basic principles of dietary content, such as calories, fat, sodium, cholesterol and nutrients.;Short Term Goal: A plan has  been developed with personal nutrition goals set during dietitian appointment.;Long Term Goal: Adherence to prescribed nutrition plan.       Nutrition Discharge: Nutrition Assessments -  08/11/17 0848      MEDFICTS Scores   Pre Score  15    Post Score  0    Score Difference  -15       Education Questionnaire Score: Knowledge Questionnaire Score - 08/11/17 0848      Knowledge Questionnaire Score   Pre Score  21/28    Post Score  24/28       Goals reviewed with patient; copy given to patient.

## 2017-08-11 NOTE — Progress Notes (Signed)
Daily Session Note  Patient Details  Name: [name] MRN: [id number] Date of Birth: [birth year] Referring Provider:     Cardiac Rehab from 05/19/2017 in [facility] Card[facility] Rehab  Referring Provider  [facility]      Encounter Date: 08/11/2017  Check In: Session Check In - 08/11/17 3833      Check-In   Supervising physician immediately available to respond to emergencies  See telemetry face sheet for immediately available ER MD    Location  [facility]Cardiac [facility] Pul[facility] Rehab    Staff Present  Gerlene Burdock, RN, BSN;Advith Martine Luan Pulling, MA, RCEP, CCRP, Exercise Physiologist;Amanda Oletta Darter, [place], ACSM CEP, Exercise Physiologist    Medication changes reported      No    Fall or balance concerns reported     No    Warm-up and Cool-down  Performed on first and last piece of equipment    Resistance Training Performed  Yes    VAD Patient?  No    PAD/SET Patient?  No      Pain Assessment   Currently in Pain?  No/denies          Social History   Tobacco Use  Smoking Status Never Smoker  Smokeless Tobacco Never Used    Goals Met:  Independence with exercise equipment Exercise tolerated well No report of cardiac concerns or symptoms Strength training completed today  Goals Unmet:  Not Applicable  Comments:  [name] graduated today from  rehab with 32 sessions completed.  Details of the patient's exercise prescription and what She needs to do in order to continue the prescription and progress were discussed with patient.  Patient was given a copy of prescription and goals.  Patient verbalized understanding.  [name] plans to continue to exercise by walking at home.    Dr. Emily Filbert is Medical Director for [place] and [facility].

## 2017-08-11 NOTE — Patient Instructions (Signed)
Discharge Patient Instructions  Patient Details  Name: [name] MRN: [id number] Date of Birth: Aug 28, 1940 Referring Provider:  Lynnell Jude, MD   Number of Visits: 40  Reason for Discharge:  Patient reached a stable level of exercise. Patient independent in their exercise. Patient has met program and personal goals.  Smoking History:  Social History   Tobacco Use  Smoking Status Never Smoker  Smokeless Tobacco Never Used    Diagnosis:  No diagnosis found.  Initial Exercise Prescription: Initial Exercise Prescription - 05/19/17 1300      Date of Initial Exercise RX and Referring Provider   Date  05/19/17    Referring Provider  K[facility]ada      NuStep   Level  1    SPM  80    Minutes  15    METs  1.3      Arm Ergometer   Level  1    Minutes  15    METs  1.3      Track   Laps  11    Minutes  15    METs  1.5      Prescription Details   Frequency (times per week)  2    Duration  Progress to 30 minutes of continuous aerobic without signs/symptoms of physical distress      Intensity   THRR 40-80% of Max Heartrate  92-127    Ratings of Perceived Exertion  11-13    Perceived Dyspnea  0-4      Resistance Training   Training Prescription  Yes    Weight  2 lb    Reps  10-15       Discharge Exercise Prescription (Final Exercise Prescription Changes): Exercise Prescription Changes - 08/03/17 1400      Response to Exercise   Blood Pressure (Admit)  104/56    Blood Pressure (Exercise)  134/56    Blood Pressure (Exit)  112/58    Heart Rate (Admit)  67 bpm    Heart Rate (Exercise)  91 bpm    Heart Rate (Exit)  68 bpm    Rating of Perceived Exertion (Exercise)  13    Symptoms  none    Duration  Continue with 30 min of aerobic exercise without signs/symptoms of physical distress.    Intensity  THRR unchanged      Progression   Progression  Continue to progress workloads to maintain intensity without signs/symptoms of physical distress.    Average  METs  2.09      Resistance Training   Training Prescription  Yes    Weight  2 lbs    Reps  10-15      Interval Training   Interval Training  No      NuStep   Level  1    Minutes  15    METs  1.8      Track   Laps  30    Minutes  15    METs  2.38      Home Exercise Plan   Plans to continue exercise at  Home (comment) walking and walking in pool    Frequency  Add 2 additional days to program exercise sessions.    Initial Home Exercises Provided  06/30/17       Functional Capacity: 6 Minute Walk    Row Name 05/19/17 1342 08/04/17 0837       6 Minute Walk   Phase  -  Discharge    Distance  733 feet  730 feet    Distance % Change  -  0.99 %    Distance Feet Change  -  3 ft    Walk Time  5.5 minutes  6 minutes    # of Rest Breaks  1  0    MPH  1.4  1.38    METS  1.33  1.4    RPE  10  14    Perceived Dyspnea   3  3    VO2 Peak  4.67  4.9    Symptoms  Yes (comment)  Yes (comment)    Comments  Short of breath - 3 on SOB scale  Cramps in right leg, SOB     Resting HR  57 bpm  72 bpm    Resting BP  122/54  112/54    Resting Oxygen Saturation   98 %  98 %    Exercise Oxygen Saturation  during 6 min walk  94 %  99 %    Max Ex. HR  94 bpm  104 bpm    Max Ex. BP  136/62  134/50    2 Minute Post BP  122/56  124/52       Quality of Life: Quality of Life - 05/19/17 1406      Quality of Life Scores   Health/Function Pre  17.04 %    Socioeconomic Pre  26 %    Psych/Spiritual Pre  27.43 %    Family Pre  24 %    GLOBAL Pre  22.02 %       Personal Goals: Goals established at orientation with interventions provided to work toward goal. Personal Goals and Risk Factors at Admission - 05/19/17 1350      Core Components/Risk Factors/Patient Goals on Admission    Weight Management  Obesity;Yes;Weight Loss    Intervention  Weight Management: Develop a combined nutrition and exercise program designed to reach desired caloric intake, while maintaining appropriate intake of  nutrient and fiber, sodium and fats, and appropriate energy expenditure required for the weight goal.;Weight Management: Provide education and appropriate resources to help participant work on and attain dietary goals.;Weight Management/Obesity: Establish reasonable short term and long term weight goals.;Obesity: Provide education and appropriate resources to help participant work on and attain dietary goals.    Admit Weight  166 lb (75.3 kg)    Goal Weight: Short Term  162 lb (73.5 kg)    Goal Weight: Long Term  150 lb (68 kg)    Expected Outcomes  Short Term: Continue to assess and modify interventions until short term weight is achieved;Long Term: Adherence to nutrition and physical activity/exercise program aimed toward attainment of established weight goal;Weight Loss: Understanding of general recommendations for a balanced deficit meal plan, which promotes 1-2 lb weight loss per week and includes a negative energy balance of [phone] kcal/d;Understanding recommendations for meals to include 15-35% energy as protein, 25-35% energy from fat, 35-60% energy from carbohydrates, less than 264m of dietary cholesterol, 20-35 gm of total fiber daily;Understanding of distribution of calorie intake throughout the day with the consumption of 4-5 meals/snacks    Diabetes  Yes    Intervention  Provide education about signs/symptoms and action to take for hypo/hyperglycemia.;Provide education about proper nutrition, including hydration, and aerobic/resistive exercise prescription along with prescribed medications to achieve blood glucose in normal ranges: Fasting glucose 65-99 mg/dL    Expected Outcomes  Short Term: Participant verbalizes understanding of the signs/symptoms and immediate care of  hyper/hypoglycemia, proper foot care and importance of medication, aerobic/resistive exercise and nutrition plan for blood glucose control.;Long Term: Attainment of HbA1C < 7%.    Heart Failure  Yes    Intervention  Provide  a combined exercise and nutrition program that is supplemented with education, support and counseling about heart failure. Directed toward relieving symptoms such as shortness of breath, decreased exercise tolerance, and extremity edema.    Expected Outcomes  Improve functional capacity of life;Short term: Attendance in program 2-3 days a week with increased exercise capacity. Reported lower sodium intake. Reported increased fruit and vegetable intake. Reports medication compliance.;Short term: Daily weights obtained and reported for increase. Utilizing diuretic protocols set by physician.;Long term: Adoption of self-care skills and reduction of barriers for early signs and symptoms recognition and intervention leading to self-care maintenance.    Hypertension  Yes    Intervention  Provide education on lifestyle modifcations including regular physical activity/exercise, weight management, moderate sodium restriction and increased consumption of fresh fruit, vegetables, and low fat dairy, alcohol moderation, and smoking cessation.;Monitor prescription use compliance.    Expected Outcomes  Short Term: Continued assessment and intervention until BP is < 140/11m HG in hypertensive participants. < 130/868mHG in hypertensive participants with diabetes, heart failure or chronic kidney disease.;Long Term: Maintenance of blood pressure at goal levels.    Lipids  Yes    Intervention  Provide education and support for participant on nutrition & aerobic/resistive exercise along with prescribed medications to achieve LDL <7064mHDL >47m35m  Expected Outcomes  Short Term: Participant states understanding of desired cholesterol values and is compliant with medications prescribed. Participant is following exercise prescription and nutrition guidelines.;Long Term: Cholesterol controlled with medications as prescribed, with individualized exercise RX and with personalized nutrition plan. Value goals: LDL < 70mg46mL > 40  mg.        Personal Goals Discharge: Goals and Risk Factor Review - 07/26/17 0824      Core Components/Risk Factors/Patient Goals Review   Personal Goals Review  Hypertension;Diabetes;Lipids    Review  Judy Bethena Roysaking all meds as directed.  Her BG and BP have been good at home.  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  We discussed using ankle circles to keep circulation going and keep as much function as possible.  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  She should graduate HT by then.        Expected Outcomes  Short - continue to attend until ready to grduate HT  Long - maintain exercise and dietary habits to keep heart healthy       Exercise Goals and Review: Exercise Goals    Row Name 05/19/17 1342             Exercise Goals   Increase Physical Activity  Yes       Intervention  Provide advice, education, support and counseling about physical activity/exercise needs.;Develop an individualized exercise prescription for aerobic and resistive training based on initial evaluation findings, risk stratification, comorbidities and participant's personal goals.       Expected Outcomes  Short Term: Attend rehab on a regular basis to increase amount of physical activity.;Long Term: Add in home exercise to make exercise part of routine and to increase amount of physical activity.;Long Term: Exercising regularly at least 3-5 days a week.       Increase Strength and Stamina  Yes       Intervention  Provide advice,  education, support and counseling about physical activity/exercise needs.;Develop an individualized exercise prescription for aerobic and resistive training based on initial evaluation findings, risk stratification, comorbidities and participant's personal goals.       Expected Outcomes  Short Term: Increase workloads from initial exercise prescription for resistance, speed, and METs.;Short Term: Perform resistance training exercises routinely during rehab and add  in resistance training at home;Long Term: Improve cardiorespiratory fitness, muscular endurance and strength as measured by increased METs and functional capacity (6MWT)       Able to understand and use rate of perceived exertion (RPE) scale  Yes       Intervention  Provide education and explanation on how to use RPE scale       Expected Outcomes  Short Term: Able to use RPE daily in rehab to express subjective intensity level;Long Term:  Able to use RPE to guide intensity level when exercising independently       Able to understand and use Dyspnea scale  Yes       Intervention  Provide education and explanation on how to use Dyspnea scale       Expected Outcomes  Short Term: Able to use Dyspnea scale daily in rehab to express subjective sense of shortness of breath during exertion;Long Term: Able to use Dyspnea scale to guide intensity level when exercising independently       Knowledge and understanding of Target Heart Rate Range (THRR)  Yes       Intervention  Provide education and explanation of THRR including how the numbers were predicted and where they are located for reference       Expected Outcomes  Short Term: Able to state/look up THRR;Short Term: Able to use daily as guideline for intensity in rehab;Long Term: Able to use THRR to govern intensity when exercising independently       Able to check pulse independently  Yes       Intervention  Provide education and demonstration on how to check pulse in carotid and radial arteries.;Review the importance of being able to check your own pulse for safety during independent exercise       Expected Outcomes  Short Term: Able to explain why pulse checking is important during independent exercise;Long Term: Able to check pulse independently and accurately       Understanding of Exercise Prescription  Yes       Intervention  Provide education, explanation, and written materials on patient's individual exercise prescription       Expected Outcomes   Short Term: Able to explain program exercise prescription;Long Term: Able to explain home exercise prescription to exercise independently          Nutrition & Weight - Outcomes: Pre Biometrics - 05/19/17 1340      Pre Biometrics   Height  5' 2.5" (1.588 m)    Weight  174 lb 1.6 oz (79 kg)    Waist Circumference  37 inches    Hip Circumference  48 inches    Waist to Hip Ratio  0.77 %    BMI (Calculated)  31.32    Single Leg Stand  1.5 seconds        Nutrition: Nutrition Therapy & Goals - 06/16/17 1010      Nutrition Therapy   Diet  TLC    Protein (specify units)  9    Fiber  25 grams    Whole Grain Foods  3 servings    Saturated Fats  11 max. 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 She is not currently meeting her protein needs and is likely not meeting her fluid needs      Intervention Plan   Intervention  Prescribe, educate and counsel regarding individualized specific dietary modifications aiming towards targeted core components such as weight, hypertension, lipid management, diabetes, heart failure and other comorbidities.;Nutrition handout(s) given to patient. Nutrition therapy for kidney stones, Lifelong dietary management for bariatric surgery handouts provided    Expected Outcomes  Short Term Goal: Understand basic principles of dietary content, such as calories, fat, sodium, cholesterol and nutrients.;Short Term  Goal: A plan has been developed with personal nutrition goals set during dietitian appointment.;Long Term Goal: Adherence to prescribed nutrition plan.       Nutrition Discharge: Nutrition Assessments - 05/19/17 1402      MEDFICTS Scores   Pre Score  15       Education Questionnaire Score: Knowledge Questionnaire Score - 05/19/17 1415      Knowledge Questionnaire Score   Pre Score  21/28 correct answers reviewed with Bethena Roys       Goals reviewed with patient; copy given to patient.

## 2017-08-11 NOTE — Progress Notes (Signed)
Cardiac Individual Treatment Plan  Patient Details  Name: Jaeliana Lococo Edwin MRN: [id number] Date of Birth: 03-Jan-1941 Referring Provider:     Cardiac Rehab from 05/19/2017 in [facility] Cardiac and Pulmonary Rehab  Referring Provider  [facility]      Initial Encounter Date:    Cardiac Rehab from 05/19/2017 in [facility] Cardiac and Pulmonary Rehab  Date  05/19/17      Visit Diagnosis: NSTEMI (non-ST elevated myocardial infarction) [facility])  Status post coronary artery stent placement  Patient's Home Medications on Admission:  Current Outpatient Medications:  .  Alirocumab (PRALUENT) 75 MG/ML SOPN, Inject 1 pen into the skin every 14 (fourteen) days. STUDY MEDICATION, Disp: 2 pen, Rfl: 11 .  aspirin 81 MG tablet, Take 81 mg by mouth daily., Disp: , Rfl:  .  Cholecalciferol (VITAMIN D3) 50000 units CAPS, Take 1 capsule by mouth 2 (two) times a week. Sunday AND Wednesday, Disp: , Rfl:  .  clopidogrel (PLAVIX) 75 MG tablet, , Disp: , Rfl: 11 .  diclofenac sodium (VOLTAREN) 1 % GEL, Apply 2 g topically daily as needed for pain., Disp: , Rfl:  .  feeding supplement, ENSURE ENLIVE, (ENSURE ENLIVE) LIQD, Take 237 mLs by mouth 2 (two) times daily between meals., Disp: 237 mL, Rfl: 12 .  fentaNYL (DURAGESIC - DOSED MCG/HR) 50 MCG/HR, Place 50 mcg onto the skin every 3 (three) days. , Disp: , Rfl:  .  fluticasone (FLONASE) 50 MCG/ACT nasal spray, Place 2 sprays into both nostrils daily as needed for allergies. , Disp: , Rfl:  .  furosemide (LASIX) 20 MG tablet, Take 1 tablet (20 mg total) by mouth daily as needed for fluid or edema., Disp: 30 tablet, Rfl: 3 .  gabapentin (NEURONTIN) 300 MG capsule, Take 300-600 mg by mouth See admin instructions. Pt takes 676m in am, 3063mat bedtime, Disp: , Rfl:  .  hydrocortisone cream 1 %, Apply topically as needed for itching., Disp: 30 g, Rfl: 0 .  isosorbide mononitrate (IMDUR) 60 MG 24 hr tablet, Take 1 tablet (60 mg total) by mouth daily. Please make yearly appt with Dr.  NeMeda Coffeeor September before anymore refills. 1st attempt, Disp: 90 tablet, Rfl: 0 .  lisinopril (PRINIVIL,ZESTRIL) 5 MG tablet, Take 1 tablet (5 mg total) by mouth daily., Disp: 30 tablet, Rfl: 12 .  metFORMIN (GLUCOPHAGE) 500 MG tablet, Take 500 mg by mouth 2 (two) times daily with a meal. , Disp: , Rfl:  .  metoprolol succinate (TOPROL-XL) 25 MG 24 hr tablet, Take by mouth., Disp: , Rfl:  .  metoprolol tartrate (LOPRESSOR) 25 MG tablet, Take 0.5 tablets (12.5 mg total) by mouth 2 (two) times daily., Disp: 90 tablet, Rfl: 3 .  nitroGLYCERIN (NITROSTAT) 0.4 MG SL tablet, Place 1 tablet (0.4 mg total) under the tongue every 5 (five) minutes as needed for chest pain., Disp: 25 tablet, Rfl: 2 .  Omega-3 Fatty Acids (FISH OIL) 1000 MG CAPS, Take 2 capsules (2,000 mg total) by mouth daily., Disp: 60 capsule, Rfl: 0 .  ondansetron (ZOFRAN ODT) 8 MG disintegrating tablet, Take 1 tablet (8 mg total) by mouth every 8 (eight) hours as needed for nausea or vomiting., Disp: 120 tablet, Rfl: 2 .  pantoprazole (PROTONIX) 40 MG tablet, Take 1 tablet (40 mg total) by mouth daily at 6 (six) AM., Disp: 30 tablet, Rfl: 0 .  promethazine (PHENERGAN) 12.5 MG tablet, Take 1 tablet (12.5 mg total) by mouth every 6 (six) hours., Disp: 100 tablet, Rfl: 0 .  Vitamin D, Ergocalciferol, (DRISDOL) 50000 units CAPS capsule, , Disp: , Rfl: 5  Current Facility-Administered Medications:  .  0.9 %  sodium chloride infusion, 500 mL, Intravenous, Continuous, Milus Banister, MD  Past Medical History: Past Medical History:  Diagnosis Date  . Anginal pain ([place])   . Anxiety   . Arthritis    "shoulders primarily" (01/13/2016)  . CAD (coronary artery disease)    s/p CABG in Oct 2017  . CHF (congestive heart failure) ([place])   . Chronic pain    from her multiple myeloma but says that her pain is usually in her legs/notes 01/13/2016  . High cholesterol   . History of blood transfusion    "numerous; related to procedures for my heart"  (01/13/2016)  . Hypertension   . Ischemic cardiomyopathy    /nots 01/13/2016  . MI (myocardial infarction) ([place])    EKG on arrival 01/13/2016 showed NSR with evidence of old anterior and inferior infarcts  . MI (myocardial infarction) ([place]) 08/2015  . Multiple myeloma ([place]) dx'd 2015   off chemo since 2016  . Nausea and vomiting 05/10/2016   3-4 month hx of nausea and vomiting  . PAD (peripheral artery disease) ([place])   . Stroke [facility])    "several in 1 year; ? year" (01/13/2016)  . Type II diabetes mellitus (H[facility])     Tobacco Use: Social History   Tobacco Use  Smoking Status Never Smoker  Smokeless Tobacco Never Used    Labs: Recent Review Flowsheet Data    Labs for ITP Cardiac and Pulmonary Rehab Latest Ref Rng & Units 03/05/2010 01/14/2016 05/04/2016 06/03/2016 09/18/2016   Cholestrol 100 - 199 mg/dL - 207(H) 162 127 -   LDLCALC 0 - 99 mg/dL - 133(H) 68 49 -   HDL >39 mg/dL - 31(L) 46 55 -   Trlycerides 0 - 149 mg/dL - 214(H) 241(H) 117 -   Hemoglobin A1c 4.8 - 5.6 % - 7.6(H) - - 7.9(H)   TCO2 0 - 100 mmol/L 22 - - - -       Exercise Target Goals:    Exercise Program Goal: Individual exercise prescription set using results from initial 6 min walk test and THRR while considering  patient's activity barriers and safety.   Exercise Prescription Goal: Initial exercise prescription builds to 30-45 minutes a day of aerobic activity, 2-3 days per week.  Home exercise guidelines will be given to patient during program as part of exercise prescription that the participant will acknowledge.  Activity Barriers & Risk Stratification: Activity Barriers & Cardiac Risk Stratification - 05/19/17 1418      Activity Barriers & Cardiac Risk Stratification   Activity Barriers  Arthritis;Shortness of Breath;Joint Problems    Cardiac Risk Stratification  Moderate       6 Minute Walk: 6 Minute Walk    Row Name 05/19/17 1342 08/04/17 0837       6 Minute Walk   Phase  -  Discharge    Distance   733 feet  730 feet    Distance % Change  -  0.99 %    Distance Feet Change  -  3 ft    Walk Time  5.5 minutes  6 minutes    # of Rest Breaks  1  0    MPH  1.4  1.38    METS  1.33  1.4    RPE  10  14    Perceived Dyspnea   3  3  VO2 Peak  4.67  4.9    Symptoms  Yes (comment)  Yes (comment)    Comments  Short of breath - 3 on SOB scale  Cramps in right leg, SOB     Resting HR  57 bpm  72 bpm    Resting BP  122/54  112/54    Resting Oxygen Saturation   98 %  98 %    Exercise Oxygen Saturation  during 6 min walk  94 %  99 %    Max Ex. HR  94 bpm  104 bpm    Max Ex. BP  136/62  134/50    2 Minute Post BP  122/56  124/52       Oxygen Initial Assessment:   Oxygen Re-Evaluation:   Oxygen Discharge (Final Oxygen Re-Evaluation):   Initial Exercise Prescription: Initial Exercise Prescription - 05/19/17 1300      Date of Initial Exercise RX and Referring Provider   Date  05/19/17    Referring Provider  [place]      NuStep   Level  1    SPM  80    Minutes  15    METs  1.3      Arm Ergometer   Level  1    Minutes  15    METs  1.3      Track   Laps  11    Minutes  15    METs  1.5      Prescription Details   Frequency (times per week)  2    Duration  Progress to 30 minutes of continuous aerobic without signs/symptoms of physical distress      Intensity   THRR 40-80% of Max Heartrate  92-127    Ratings of Perceived Exertion  11-13    Perceived Dyspnea  0-4      Resistance Training   Training Prescription  Yes    Weight  2 lb    Reps  10-15       Perform Capillary Blood Glucose checks as needed.  Exercise Prescription Changes: Exercise Prescription Changes    Row Name 05/19/17 1300 06/07/17 1700 06/21/17 1500 06/30/17 0900 07/07/17 0800     Response to Exercise   Blood Pressure (Admit)  122/54  116/56  104/48  -  116/54   Blood Pressure (Exercise)  136/62  146/64  138/52  -  138/70   Blood Pressure (Exit)  122/56  124/60  142/54  -  132/60   Heart Rate  (Admit)  58 bpm  60 bpm  76 bpm  -  67 bpm   Heart Rate (Exercise)  94 bpm  82 bpm  83 bpm  -  89 bpm   Heart Rate (Exit)  84 bpm  65 bpm  71 bpm  -  72 bpm   Oxygen Saturation (Admit)  98 %  -  -  -  -   Oxygen Saturation (Exercise)  98 %  -  -  -  -   Rating of Perceived Exertion (Exercise)  '10  14  14  '$ -  12   Symptoms  -  none   none  -  none   Comments  -  first full day of exercise  -  -  -   Duration  -  Continue with 30 min of aerobic exercise without signs/symptoms of physical distress.  Continue with 30 min of aerobic exercise without signs/symptoms of physical distress.  -  Continue  with 30 min of aerobic exercise without signs/symptoms of physical distress.   Intensity  -  THRR unchanged  THRR unchanged  -  THRR unchanged     Progression   Progression  -  Continue to progress workloads to maintain intensity without signs/symptoms of physical distress.  Continue to progress workloads to maintain intensity without signs/symptoms of physical distress.  -  Continue to progress workloads to maintain intensity without signs/symptoms of physical distress.   Average METs  -  1.6  2  -  3.45     Resistance Training   Training Prescription  -  Yes  Yes  -  Yes   Weight  -  2 lb  2 lbs  -  2 lbs   Reps  -  10-15  10-15  -  10-15     Interval Training   Interval Training  -  No  No  -  No     Recumbant Bike   Level  -  -  1  -  1   Watts  -  -  -  -  34   Minutes  -  -  15  -  15   METs  -  -  -  -  3.78     NuStep   Level  -  1  -  -  1   Minutes  -  15  -  -  15   METs  -  1.6  -  -  1.9     Arm Ergometer   Level  -  1  -  -  -   Minutes  -  15  -  -  -     Track   Laps  -  -  22  -  24   Minutes  -  -  15  -  15   METs  -  -  2  -  2.11     Home Exercise Plan   Plans to continue exercise at  -  -  -  Home (comment) walking and walking in pool  Home (comment) walking and walking in pool   Frequency  -  -  -  Add 2 additional days to program exercise sessions.  Add 2  additional days to program exercise sessions.   Initial Home Exercises Provided  -  -  -  06/30/17  06/30/17   Row Name 07/19/17 1500 08/03/17 1400           Response to Exercise   Blood Pressure (Admit)  134/62  104/56      Blood Pressure (Exercise)  125/56  134/56      Blood Pressure (Exit)  128/50  112/58      Heart Rate (Admit)  63 bpm  67 bpm      Heart Rate (Exercise)  72 bpm  91 bpm      Heart Rate (Exit)  62 bpm  68 bpm      Rating of Perceived Exertion (Exercise)  15  13      Symptoms  none  none      Duration  Continue with 30 min of aerobic exercise without signs/symptoms of physical distress.  Continue with 30 min of aerobic exercise without signs/symptoms of physical distress.      Intensity  THRR unchanged  THRR unchanged        Progression   Progression  Continue to progress workloads to  maintain intensity without signs/symptoms of physical distress.  Continue to progress workloads to maintain intensity without signs/symptoms of physical distress.      Average METs  1.75  2.09        Resistance Training   Training Prescription  Yes  Yes      Weight  2 lb  2 lbs      Reps  10-15  10-15        Interval Training   Interval Training  No  No        NuStep   Level  1  1      Minutes  15  15      METs  1.5  1.8        Track   Laps  24  30      Minutes  15  15      METs  2.11  2.38        Home Exercise Plan   Plans to continue exercise at  -  Home (comment) walking and walking in pool      Frequency  -  Add 2 additional days to program exercise sessions.      Initial Home Exercises Provided  -  06/30/17         Exercise Comments: Exercise Comments    Row Name 06/07/17 2979 08/11/17 8921         Exercise Comments   First full day of exercise!  Patient was oriented to gym and equipment including functions, settings, policies, and procedures.  Patient's individual exercise prescription and treatment plan were reviewed.  All starting workloads were established  based on the results of the 6 minute walk test done at initial orientation visit.  The plan for exercise progression was also introduced and progression will be customized based on patient's performance and goals.   Bethena Roys graduated today from  rehab with 32 sessions completed.  Details of the patient's exercise prescription and what She needs to do in order to continue the prescription and progress were discussed with patient.  Patient was given a copy of prescription and goals.  Patient verbalized understanding.  Bethena Roys plans to continue to exercise by walking at home.         Exercise Goals and Review: Exercise Goals    Row Name 05/19/17 1342             Exercise Goals   Increase Physical Activity  Yes       Intervention  Provide advice, education, support and counseling about physical activity/exercise needs.;Develop an individualized exercise prescription for aerobic and resistive training based on initial evaluation findings, risk stratification, comorbidities and participant's personal goals.       Expected Outcomes  Short Term: Attend rehab on a regular basis to increase amount of physical activity.;Long Term: Add in home exercise to make exercise part of routine and to increase amount of physical activity.;Long Term: Exercising regularly at least 3-5 days a week.       Increase Strength and Stamina  Yes       Intervention  Provide advice, education, support and counseling about physical activity/exercise needs.;Develop an individualized exercise prescription for aerobic and resistive training based on initial evaluation findings, risk stratification, comorbidities and participant's personal goals.       Expected Outcomes  Short Term: Increase workloads from initial exercise prescription for resistance, speed, and METs.;Short Term: Perform resistance training exercises routinely during rehab and add in resistance training at home;Long Term:  Improve cardiorespiratory fitness, muscular endurance  and strength as measured by increased METs and functional capacity (6MWT)       Able to understand and use rate of perceived exertion (RPE) scale  Yes       Intervention  Provide education and explanation on how to use RPE scale       Expected Outcomes  Short Term: Able to use RPE daily in rehab to express subjective intensity level;Long Term:  Able to use RPE to guide intensity level when exercising independently       Able to understand and use Dyspnea scale  Yes       Intervention  Provide education and explanation on how to use Dyspnea scale       Expected Outcomes  Short Term: Able to use Dyspnea scale daily in rehab to express subjective sense of shortness of breath during exertion;Long Term: Able to use Dyspnea scale to guide intensity level when exercising independently       Knowledge and understanding of Target Heart Rate Range (THRR)  Yes       Intervention  Provide education and explanation of THRR including how the numbers were predicted and where they are located for reference       Expected Outcomes  Short Term: Able to state/look up THRR;Short Term: Able to use daily as guideline for intensity in rehab;Long Term: Able to use THRR to govern intensity when exercising independently       Able to check pulse independently  Yes       Intervention  Provide education and demonstration on how to check pulse in carotid and radial arteries.;Review the importance of being able to check your own pulse for safety during independent exercise       Expected Outcomes  Short Term: Able to explain why pulse checking is important during independent exercise;Long Term: Able to check pulse independently and accurately       Understanding of Exercise Prescription  Yes       Intervention  Provide education, explanation, and written materials on patient's individual exercise prescription       Expected Outcomes  Short Term: Able to explain program exercise prescription;Long Term: Able to explain home exercise  prescription to exercise independently          Exercise Goals Re-Evaluation : Exercise Goals Re-Evaluation    Row Name 06/07/17 0828 06/21/17 1521 06/23/17 0957 06/30/17 0929 07/07/17 0846     Exercise Goal Re-Evaluation   Exercise Goals Review  Increase Physical Activity;Increase Strength and Stamina;Able to understand and use rate of perceived exertion (RPE) scale  Increase Physical Activity;Increase Strength and Stamina;Able to understand and use rate of perceived exertion (RPE) scale  Increase Physical Activity;Increase Strength and Stamina;Understanding of Exercise Prescription  Increase Physical Activity;Able to understand and use rate of perceived exertion (RPE) scale;Knowledge and understanding of Target Heart Rate Range (THRR);Understanding of Exercise Prescription  Increase Physical Activity;Understanding of Exercise Prescription;Increase Strength and Stamina   Comments  Reviewed RPE scale, THR and program prescription with pt today.  Pt voiced understanding and was given a copy of goals to take home  Bethena Roys is off to a good start in rehab.  She is now up to 22 laps on the track.  We will continue to monitor her progression.   Bethena Roys has been doing well with exercise.  She is already starting to feel stronger and has more stamina.  She would like to continue to work on her balance.  She is  currently getting some exercise at home.  She has been walking in the pool at home for 600-800 ft each day.  We will talk about home exercise guidelines soon and working her way up 15mn all at once.   Reviewed home exercise with pt today.  Pt plans to walk at home and in her pool for exercise.  Reviewed THR, pulse, RPE, sign and symptoms, NTG use, and when to call 911 or MD.  Also discussed weather considerations and indoor options.  Pt voiced understanding.  JBethena Royshas been doing well in rehab. She is getting in 24 laps at around the track!!  We will continue to monitor her progress.    Expected Outcomes  Short:  Use RPE daily to regulate intensity.  Long: Follow program prescription in THR.  Short: Continue to increase workloads.  Long: Continue to increase activity levels.   Short: Review home exercise guidelines.  Long: Continue to increase strength and stamina.   Short: Add in walking at home routines for 30 min.  Long: Continue to increase activity levels  Short: Continue to add in walking at home.  Long: Continue to exercise independently.    [place] River TerraceName 07/19/17 1542 08/03/17 1405           Exercise Goal Re-Evaluation   Exercise Goals Review  Increase Physical Activity;Increase Strength and Stamina;Able to understand and use rate of perceived exertion (RPE) scale  Increase Physical Activity;Understanding of Exercise Prescription;Increase Strength and Stamina      Comments  JBethena Royshas been diganosed with CLL.  She sees her Dr in 2 weeks and staff will follow up with exercise changes.  Main concern is fatigue.  JBethena Royshas been doing well in rehab.  They have decided to allow her to complete cardiac rehab prior to starting cancer treatments.  She has been getting in 30 laps of walking.  We will continue to montior her progress.       Expected Outcomes  Short - JBethena Royswill find out a plan for CLL Long - JBethena Royswill complete as much of HT as she is able  Short: Increase NuStep and improve post 6MWT.  Long: Continue to maintain exercising on her own.          Discharge Exercise Prescription (Final Exercise Prescription Changes): Exercise Prescription Changes - 08/03/17 1400      Response to Exercise   Blood Pressure (Admit)  104/56    Blood Pressure (Exercise)  134/56    Blood Pressure (Exit)  112/58    Heart Rate (Admit)  67 bpm    Heart Rate (Exercise)  91 bpm    Heart Rate (Exit)  68 bpm    Rating of Perceived Exertion (Exercise)  13    Symptoms  none    Duration  Continue with 30 min of aerobic exercise without signs/symptoms of physical distress.    Intensity  THRR unchanged      Progression    Progression  Continue to progress workloads to maintain intensity without signs/symptoms of physical distress.    Average METs  2.09      Resistance Training   Training Prescription  Yes    Weight  2 lbs    Reps  10-15      Interval Training   Interval Training  No      NuStep   Level  1    Minutes  15    METs  1.8      Track   Laps  30  Minutes  15    METs  2.38      Home Exercise Plan   Plans to continue exercise at  Home (comment) walking and walking in pool    Frequency  Add 2 additional days to program exercise sessions.    Initial Home Exercises Provided  06/30/17       Nutrition:  Target Goals: Understanding of nutrition guidelines, daily intake of sodium '1500mg'$ , cholesterol '200mg'$ , calories 30% from fat and 7% or less from saturated fats, daily to have 5 or more servings of fruits and vegetables.  Biometrics: Pre Biometrics - 05/19/17 1340      Pre Biometrics   Height  5' 2.5" (1.588 m)    Weight  174 lb 1.6 oz (79 kg)    Waist Circumference  37 inches    Hip Circumference  48 inches    Waist to Hip Ratio  0.77 %    BMI (Calculated)  31.32    Single Leg Stand  1.5 seconds        Nutrition Therapy Plan and Nutrition Goals: Nutrition Therapy & Goals - 06/16/17 1010      Nutrition Therapy   Diet  TLC    Protein (specify units)  9    Fiber  25 grams    Whole Grain Foods  3 servings    Saturated Fats  11 max. grams    Fruits and Vegetables  4 servings/day 8 ideal, eats small portions d/t h/o bariatric surgery    Sodium  2000 grams      Personal Nutrition Goals   Nutrition Goal  Increase intake of Boost supplements to once every other day at minimum, once daily ideally to increase your protein intake    Personal Goal #2  Start taking a calcium supplement daily    Personal Goal #3  Increase your daily fluid intake with an ultimate goal of 64oz/day, per both bariatric surgery and kidney stone guidelines    Comments  She had bariatric surgery 10 years  ago and struggles to eat more than 2oz of food at a time. She is not currently meeting her protein needs and is likely not meeting her fluid needs      Intervention Plan   Intervention  Prescribe, educate and counsel regarding individualized specific dietary modifications aiming towards targeted core components such as weight, hypertension, lipid management, diabetes, heart failure and other comorbidities.;Nutrition handout(s) given to patient. Nutrition therapy for kidney stones, Lifelong dietary management for bariatric surgery handouts provided    Expected Outcomes  Short Term Goal: Understand basic principles of dietary content, such as calories, fat, sodium, cholesterol and nutrients.;Short Term Goal: A plan has been developed with personal nutrition goals set during dietitian appointment.;Long Term Goal: Adherence to prescribed nutrition plan.       Nutrition Assessments: Nutrition Assessments - 08/11/17 0848      MEDFICTS Scores   Pre Score  15    Post Score  0    Score Difference  -15       Nutrition Goals Re-Evaluation: Nutrition Goals Re-Evaluation    [place] Name 06/16/17 1030 06/23/17 0905 06/23/17 1047 08/04/17 0822       Goals   Current Weight  -  -  172 lb (78 kg)  175 lb (79.4 kg)    Nutrition Goal  Increase your daily fluid intake with an ultimate goal of 64oz/day, per both bariatric surgery and kidney stone guidelines  Increase your daily fluid intake with an ultimate goal of  64oz/day, per both bariatric surgery and kidney stone guidelines  Increase your daily fluid intake with an ultimate goal of 64oz/day, per both bariatric surgery and kidney stone guidelines, Boost, calcuim supplement  Increase protein in diet, eat more, continue to increase daily fluid intake.     Comment  She tries to carry a watter bottle with her throughout the day but per her report does not meet her daily fluid goals. She has developed kidney stones, likely d/t under-hydration post-bariatric surgery   -  Bethena Roys has been doing well with her weight.  She says that she gets plenty of fluids and still eats about 6 small meals a day.  She still does not eat very much.  She is taking Boost daily to increase her calories and protein intake.  She does not eat meat and has been trying to eat more beans.  She is also taking her calcium supplement.   Bethena Roys has gained some weight, which frustrates her. She admitted to still not eating enough protein, mainly vegetables and snacks. She hasn't been drinking Boost regularly because it bothers her stomach.     Expected Outcome  She will consume 64oz of fluid per day ideally - or two and a half of her typically-used water bottle  -  Short: Continue to to try to eat more protein.  Long: Continue to heart healthy.   Short: find foods other than meat as a protein source Long: eat 6 small meals a day to ensure all nutrients, continue to eat heart healthy.       Personal Goal #2 Re-Evaluation   Personal Goal #2  Increase intake of Boost supplements to once every other day at minimum, once daily ideally to increase your protein intake  -  -  -      Personal Goal #3 Re-Evaluation   Personal Goal #3  Start taking a calcium supplement daily  -  -  -       Nutrition Goals Discharge (Final Nutrition Goals Re-Evaluation): Nutrition Goals Re-Evaluation - 08/04/17 1448      Goals   Current Weight  175 lb (79.4 kg)    Nutrition Goal  Increase protein in diet, eat more, continue to increase daily fluid intake.     Comment  Bethena Roys has gained some weight, which frustrates her. She admitted to still not eating enough protein, mainly vegetables and snacks. She hasn't been drinking Boost regularly because it bothers her stomach.     Expected Outcome  Short: find foods other than meat as a protein source Long: eat 6 small meals a day to ensure all nutrients, continue to eat heart healthy.        Psychosocial: Target Goals: Acknowledge presence or absence of significant depression  and/or stress, maximize coping skills, provide positive support system. Participant is able to verbalize types and ability to use techniques and skills needed for reducing stress and depression.   Initial Review & Psychosocial Screening: Initial Psych Review & Screening - 05/19/17 1405      Initial Review   Current issues with  Current Sleep Concerns;Current Stress Concerns she feels like she sleeps too much, taking afternoon naps and then going to bed early    Source of Stress Concerns  Unable to perform yard/household activities;Unable to participate in former interests or hobbies      [place]?  Yes family members      Barriers   Psychosocial barriers to participate in program  The patient should benefit from training in stress management and relaxation.;There are no identifiable barriers or psychosocial needs.      Screening Interventions   Interventions  Encouraged to exercise;Provide feedback about the scores to participant;Program counselor consult;To provide support and resources with identified psychosocial needs    Expected Outcomes  Short Term goal: Utilizing psychosocial counselor, staff and physician to assist with identification of specific Stressors or current issues interfering with healing process. Setting desired goal for each stressor or current issue identified.;Long Term Goal: Stressors or current issues are controlled or eliminated.;Short Term goal: Identification and review with participant of any Quality of Life or Depression concerns found by scoring the questionnaire.;Long Term goal: The participant improves quality of Life and PHQ9 Scores as seen by post scores and/or verbalization of changes       Quality of Life Scores:  Quality of Life - 08/11/17 0848      Quality of Life   Select  Quality of Life      Quality of Life Scores   Health/Function Pre  17.04 %    Health/Function Post  20.77 %    Health/Function % Change  21.89 %     Socioeconomic Pre  26 %    Socioeconomic Post  28.93 %    Socioeconomic % Change   11.27 %    Psych/Spiritual Pre  27.43 %    Psych/Spiritual Post  29.64 %    Psych/Spiritual % Change  8.06 %    Family Pre  24 %    Family Post  17.5 %    Family % Change  -27.08 %    GLOBAL Pre  22.02 %    GLOBAL Post  23.79 %    GLOBAL % Change  8.04 %      Scores of 19 and below usually indicate a poorer quality of life in these areas.  A difference of  2-3 points is a clinically meaningful difference.  A difference of 2-3 points in the total score of the Quality of Life Index has been associated with significant improvement in overall quality of life, self-image, physical symptoms, and general health in studies assessing change in quality of life.  PHQ-9: Recent Review Flowsheet Data    Depression screen [facility] 2/9 08/11/2017 06/09/2017 05/19/2017 12/15/2015   Decreased Interest 2 0 0 0   Down, Depressed, Hopeless 0 0 0 0   PHQ - 2 Score 2 0 0 0   Altered sleeping 0 0 2 3    Tired, decreased energy '3 2 2 2    '$ Change in appetite '3 2 2 '$ 0   Feeling bad or failure about yourself  0 0 0 0   Trouble concentrating 0 1 1 0   Moving slowly or fidgety/restless '1 1 1 '$ 0   Suicidal thoughts 0 0 0 0   PHQ-9 Score '9 6 8 5   '$ Difficult doing work/chores Somewhat difficult - - -     Interpretation of Total Score  Total Score Depression Severity:  1-4 = Minimal depression, 5-9 = Mild depression, 10-14 = Moderate depression, 15-19 = Moderately severe depression, 20-27 = Severe depression   Psychosocial Evaluation and Intervention: Psychosocial Evaluation - 06/09/17 0943      Psychosocial Evaluation & Interventions   Interventions  Encouraged to exercise with the program and follow exercise prescription    Comments  Counselor met with Ms. Lantis Bethena Roys) today for initial psychosocial evaluation.  She is a 77 year old who had  a heart attack and (2) stents inserted approximately one month ago.  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  She has a strong support system with a son locally and active involvement in her local church.  She sleeps well and has a limited appetite due to lap band surgery 10 years ago wherein she lost over 100 lbs since then.  She denies a history of depression or anxiety or any current symptoms and her mood is generally positive.  She has minimal stress in her life other than her health.   Bethena Roys has goals to walk without her walker and have a stronger heart.  Staff will follow with her.      Expected Outcomes  Short:  Bethena Roys will exercise to be able to walk without the walker.  Long:  Bethena Roys will exercise consistently to improve her stamina and strength.      Continue Psychosocial Services   Follow up required by staff       Psychosocial Re-Evaluation: Psychosocial Re-Evaluation    [place] Name 06/23/17 1121 07/19/17 1043 08/04/17 9485         Psychosocial Re-Evaluation   Current issues with  None Identified  Current Stress Concerns  Current Stress Concerns     Comments  Bethena Roys is doing well mentally.  She has very little stress, primarily her health.  She does her best to stay even keeled.  The best part has been that she is starting to feel better overall!  She enjoys coming to class.   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  Counselor processed this with Bethena Roys and provided supportive services.  She was tearful and reports relying on her family, friends, her dog and her brother to help her through this. She will be meeting with her cancer team in August for recommendations for treatment and Bethena Roys reports she is not in favor of chemo again - since she went through this in 2015 and it was terrible.  Bethena Roys is encouraged to practice positive self care and is commended that she reports she is "not a quitter."  She plans to live each day to the fullest and counselor.  Bethena Roys  reports exercising in this program takes a lot of her energy and it requires 24 hours for her to recover her energy.  Counselor encouraged Bethena Roys to speak to her Dr. about an alternate program - possibly for cancer patients.  Bethena Roys will be followed while in this program.  Bethena Roys says she has accepted her diagnosis and is anxious to begin treatment on August 6th. She doesn't want to waste her energy on worry over it, at least until she sees her doctor. Her new stress is her legs, she has been experiencing severe leg cramps in her right leg. When they occur she eats a mustard packet and says that helps short term. She was encouraged to continue to increase her fluid intake to help as well.      Expected Outcomes  Short: Continue to attend rehab.  Long: Continue to stay postive.   Short:  Bethena Roys will speak to her Dr. about an alternate program to conserve her energy during this new diagnosis.  She will also maintain a positive and determined attitude.  Long:  Bethena Roys will continue to exercise and practice positive self-care while facing her new diagnosis and possible treatment.    Short:  Increase fluid intake to help with possible dehydration that can play in to her leg cramps. Long: remain positive throughout CLL treatments and continue to exercise to stay as strong as possible.      Interventions  Encouraged to attend Cardiac Rehabilitation for the exercise  Stress management education;Relaxation education  -     Continue Psychosocial Services   Follow up required by staff  Follow up required by staff  Follow up required by staff        Psychosocial Discharge (Final Psychosocial Re-Evaluation): Psychosocial Re-Evaluation - 08/04/17 1191      Psychosocial Re-Evaluation   Current issues with  Current Stress Concerns    Comments  Bethena Roys says she has accepted her diagnosis and is anxious to begin treatment on August 6th. She doesn't want to waste her energy on worry over it, at least until she sees her doctor. Her new  stress is her legs, she has been experiencing severe leg cramps in her right leg. When they occur she eats a mustard packet and says that helps short term. She was encouraged to continue to increase her fluid intake to help as well.     Expected Outcomes  Short: Increase fluid intake to help with possible dehydration that can play in to her leg cramps. Long: remain positive throughout CLL treatments and continue to exercise to stay as strong as possible.     Continue Psychosocial Services   Follow up required by staff       Vocational Rehabilitation: Provide vocational rehab assistance to qualifying candidates.   Vocational Rehab Evaluation & Intervention: Vocational Rehab - 05/19/17 1415      Initial Vocational Rehab Evaluation & Intervention   Assessment shows need for Vocational Rehabilitation  No       Education: Education Goals: Education classes will be provided on a variety of topics geared toward better understanding of heart health and risk factor modification. Participant will state understanding/return demonstration of topics presented as noted by education test scores.  Learning Barriers/Preferences: Learning Barriers/Preferences - 05/19/17 1410      Learning Barriers/Preferences   Learning Barriers  None    Learning Preferences  Individual Instruction;Video       Education Topics:  AED/CPR: - Group verbal and written instruction with the use of models to demonstrate the basic use of the AED with the basic ABC's of resuscitation.   Cardiac Rehab from 08/11/2017 in [facility] Cardiac and Pulmonary Rehab  Date  07/28/17  Educator  SB  Instruction Review Code  1- Verbalizes Understanding      General Nutrition Guidelines/Fats and Fiber: -Group instruction provided by verbal, written material, models and posters to present the general guidelines for heart healthy nutrition. Gives an explanation and review of dietary fats and fiber.   Cardiac Rehab from 08/11/2017 in [facility]  Cardiac and Pulmonary Rehab  Date  07/26/17  Educator  CR  Instruction Review Code  1- Verbalizes Understanding      Controlling Sodium/Reading Food Labels: -Group verbal and written material supporting the discussion of sodium use in heart healthy nutrition. Review and explanation with models, verbal and written materials for utilization of the food label.   Cardiac Rehab from 08/11/2017 in [facility] Cardiac and Pulmonary Rehab  Date  08/02/17  Educator  SB  Instruction Review Code  1- Verbalizes Understanding      Exercise Physiology & General Exercise Guidelines: - Group verbal and written instruction with models to review the exercise physiology of the cardiovascular system and  associated critical values. Provides general exercise guidelines with specific guidelines to those with heart or lung disease.    Cardiac Rehab from 08/11/2017 in [facility] Cardiac and Pulmonary Rehab  Date  06/14/17  Educator  AS  Instruction Review Code  1- Verbalizes Understanding      Aerobic Exercise & Resistance Training: - Gives group verbal and written instruction on the various components of exercise. Focuses on aerobic and resistive training programs and the benefits of this training and how to safely progress through these programs..   Flexibility, Balance, Mind/Body Relaxation: Provides group verbal/written instruction on the benefits of flexibility and balance training, including mind/body exercise modes such as yoga, pilates and tai chi.  Demonstration and skill practice provided.   Cardiac Rehab from 08/11/2017 in [facility] Cardiac and Pulmonary Rehab  Date  06/30/17  Educator  AS  Instruction Review Code  1- Verbalizes Understanding      Stress and Anxiety: - Provides group verbal and written instruction about the health risks of elevated stress and causes of high stress.  Discuss the correlation between heart/lung disease and anxiety and treatment options. Review healthy ways to manage with stress and  anxiety.   Cardiac Rehab from 08/11/2017 in [facility] Cardiac and Pulmonary Rehab  Date  07/19/17  Educator  [facility]  Instruction Review Code  1- Verbalizes Understanding      Depression: - Provides group verbal and written instruction on the correlation between heart/lung disease and depressed mood, treatment options, and the stigmas associated with seeking treatment.   Anatomy & Physiology of the Heart: - Group verbal and written instruction and models provide basic cardiac anatomy and physiology, with the coronary electrical and arterial systems. Review of Valvular disease and Heart Failure   Cardiac Rehab from 08/11/2017 in [facility] Cardiac and Pulmonary Rehab  Date  07/21/17  Educator  SB  Instruction Review Code  1- Verbalizes Understanding      Cardiac Procedures: - Group verbal and written instruction to review commonly prescribed medications for heart disease. Reviews the medication, class of the drug, and side effects. Includes the steps to properly store meds and maintain the prescription regimen. (beta blockers and nitrates)   Cardiac Medications I: - Group verbal and written instruction to review commonly prescribed medications for heart disease. Reviews the medication, class of the drug, and side effects. Includes the steps to properly store meds and maintain the prescription regimen.   Cardiac Rehab from 08/11/2017 in [facility] Cardiac and Pulmonary Rehab  Date  07/05/17  Educator  SB  Instruction Review Code  1- Verbalizes Understanding      Cardiac Medications II: -Group verbal and written instruction to review commonly prescribed medications for heart disease. Reviews the medication, class of the drug, and side effects. (all other drug classes)   Cardiac Rehab from 08/11/2017 in [facility] Cardiac and Pulmonary Rehab  Date  06/23/17  Educator  CE  Instruction Review Code  1- Verbalizes Understanding       Go Sex-Intimacy & Heart Disease, Get SMART - Goal Setting: - Group verbal and  written instruction through game format to discuss heart disease and the return to sexual intimacy. Provides group verbal and written material to discuss and apply goal setting through the application of the S.M.A.R.T. Method.   Other Matters of the Heart: - Provides group verbal, written materials and models to describe Stable Angina and Peripheral Artery. Includes description of the disease process and treatment options available to the cardiac patient.   Cardiac Rehab from  08/11/2017 in [facility] Cardiac and Pulmonary Rehab  Date  07/21/17  Educator  SB  Instruction Review Code  1- Verbalizes Understanding      Exercise & Equipment Safety: - Individual verbal instruction and demonstration of equipment use and safety with use of the equipment.   Cardiac Rehab from 08/11/2017 in [facility] Cardiac and Pulmonary Rehab  Date  05/19/17  Educator  [facility]  Instruction Review Code  1- Verbalizes Understanding      Infection Prevention: - Provides verbal and written material to individual with discussion of infection control including proper hand washing and proper equipment cleaning during exercise session.   Cardiac Rehab from 08/11/2017 in [facility] Cardiac and Pulmonary Rehab  Date  05/19/17  Educator  [facility]  Instruction Review Code  1- Verbalizes Understanding      Falls Prevention: - Provides verbal and written material to individual with discussion of falls prevention and safety.   Cardiac Rehab from 08/11/2017 in [facility] Cardiac and Pulmonary Rehab  Date  05/19/17  Educator  [facility]  Instruction Review Code (retired)  1- partially meets, needs review/practice      Diabetes: - Individual verbal and written instruction to review signs/symptoms of diabetes, desired ranges of glucose level fasting, after meals and with exercise. Acknowledge that pre and post exercise glucose checks will be done for 3 sessions at entry of program.   Cardiac Rehab from 08/11/2017 in [facility] Cardiac and Pulmonary Rehab  Date  05/19/17    Educator  [facility]  Instruction Review Code  1- Verbalizes Understanding      Know Your Numbers and Risk Factors: -Group verbal and written instruction about important numbers in your health.  Discussion of what are risk factors and how they play a role in the disease process.  Review of Cholesterol, Blood Pressure, Diabetes, and BMI and the role they play in your overall health.   Cardiac Rehab from 08/11/2017 in [facility] Cardiac and Pulmonary Rehab  Date  06/23/17  Educator  CE  Instruction Review Code  1- Verbalizes Understanding      Sleep Hygiene: -Provides group verbal and written instruction about how sleep can affect your health.  Define sleep hygiene, discuss sleep cycles and impact of sleep habits. Review good sleep hygiene tips.    Cardiac Rehab from 08/11/2017 in [facility] Cardiac and Pulmonary Rehab  Date  08/11/17  Educator  [facility]  Instruction Review Code  1- Verbalizes Understanding      Other: -Provides group and verbal instruction on various topics (see comments)   Knowledge Questionnaire Score: Knowledge Questionnaire Score - 08/11/17 0848      Knowledge Questionnaire Score   Pre Score  21/28    Post Score  24/28       Core Components/Risk Factors/Patient Goals at Admission: Personal Goals and Risk Factors at Admission - 05/19/17 1350      Core Components/Risk Factors/Patient Goals on Admission    Weight Management  Obesity;Yes;Weight Loss    Intervention  Weight Management: Develop a combined nutrition and exercise program designed to reach desired caloric intake, while maintaining appropriate intake of nutrient and fiber, sodium and fats, and appropriate energy expenditure required for the weight goal.;Weight Management: Provide education and appropriate resources to help participant work on and attain dietary goals.;Weight Management/Obesity: Establish reasonable short term and long term weight goals.;Obesity: Provide education and appropriate resources to help participant  work on and attain dietary goals.    Admit Weight  166 lb (75.3 kg)    Goal Weight: Short  Term  162 lb (73.5 kg)    Goal Weight: Long Term  150 lb (68 kg)    Expected Outcomes  Short Term: Continue to assess and modify interventions until short term weight is achieved;Long Term: Adherence to nutrition and physical activity/exercise program aimed toward attainment of established weight goal;Weight Loss: Understanding of general recommendations for a balanced deficit meal plan, which promotes 1-2 lb weight loss per week and includes a negative energy balance of [phone] kcal/d;Understanding recommendations for meals to include 15-35% energy as protein, 25-35% energy from fat, 35-60% energy from carbohydrates, less than '200mg'$  of dietary cholesterol, 20-35 gm of total fiber daily;Understanding of distribution of calorie intake throughout the day with the consumption of 4-5 meals/snacks    Diabetes  Yes    Intervention  Provide education about signs/symptoms and action to take for hypo/hyperglycemia.;Provide education about proper nutrition, including hydration, and aerobic/resistive exercise prescription along with prescribed medications to achieve blood glucose in normal ranges: Fasting glucose 65-99 mg/dL    Expected Outcomes  Short Term: Participant verbalizes understanding of the signs/symptoms and immediate care of hyper/hypoglycemia, proper foot care and importance of medication, aerobic/resistive exercise and nutrition plan for blood glucose control.;Long Term: Attainment of HbA1C < 7%.    Heart Failure  Yes    Intervention  Provide a combined exercise and nutrition program that is supplemented with education, support and counseling about heart failure. Directed toward relieving symptoms such as shortness of breath, decreased exercise tolerance, and extremity edema.    Expected Outcomes  Improve functional capacity of life;Short term: Attendance in program 2-3 days a week with increased exercise  capacity. Reported lower sodium intake. Reported increased fruit and vegetable intake. Reports medication compliance.;Short term: Daily weights obtained and reported for increase. Utilizing diuretic protocols set by physician.;Long term: Adoption of self-care skills and reduction of barriers for early signs and symptoms recognition and intervention leading to self-care maintenance.    Hypertension  Yes    Intervention  Provide education on lifestyle modifcations including regular physical activity/exercise, weight management, moderate sodium restriction and increased consumption of fresh fruit, vegetables, and low fat dairy, alcohol moderation, and smoking cessation.;Monitor prescription use compliance.    Expected Outcomes  Short Term: Continued assessment and intervention until BP is < 140/49m HG in hypertensive participants. < 130/864mHG in hypertensive participants with diabetes, heart failure or chronic kidney disease.;Long Term: Maintenance of blood pressure at goal levels.    Lipids  Yes    Intervention  Provide education and support for participant on nutrition & aerobic/resistive exercise along with prescribed medications to achieve LDL '70mg'$ , HDL >'40mg'$ .    Expected Outcomes  Short Term: Participant states understanding of desired cholesterol values and is compliant with medications prescribed. Participant is following exercise prescription and nutrition guidelines.;Long Term: Cholesterol controlled with medications as prescribed, with individualized exercise RX and with personalized nutrition plan. Value goals: LDL < '70mg'$ , HDL > 40 mg.       Core Components/Risk Factors/Patient Goals Review:  Goals and Risk Factor Review    Row Name 06/23/17 1041 07/26/17 0824           Core Components/Risk Factors/Patient Goals Review   Personal Goals Review  Weight Management/Obesity;Hypertension;Diabetes;Lipids;Heart Failure  Hypertension;Diabetes;Lipids      Review  Judy's weight has been pretty  steady around 172 lbs.  She did gain some over the last two weeks as her bowels were not working properly and now after getting a medication to help she is having loose stools.  She is going to keep an eye on this and let her doctor know if it continues.  Her blood pressures have been good and she checks them at home 2-3 times a week.  She is doing well on her medications and has not had any heart failure symptoms.    Bethena Roys is taking all meds as directed.  Her BG and BP have been good at home.  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  We discussed using ankle circles to keep circulation going and keep as much function as possible.  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  She should graduate HT by then.          Expected Outcomes  Short: Continue to work monitor heart failure symptoms.  Long: Continue to work on weight loss.   Short - continue to attend until ready to grduate HT  Long - maintain exercise and dietary habits to keep heart healthy         Core Components/Risk Factors/Patient Goals at Discharge (Final Review):  Goals and Risk Factor Review - 07/26/17 0824      Core Components/Risk Factors/Patient Goals Review   Personal Goals Review  Hypertension;Diabetes;Lipids    Review  Bethena Roys is taking all meds as directed.  Her BG and BP have been good at home.  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  We discussed using ankle circles to keep circulation going and keep as much function as possible.  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  She should graduate HT by then.        Expected Outcomes  Short - continue to attend until ready to grduate HT  Long - maintain exercise and dietary habits to keep heart healthy       ITP Comments: ITP Comments    Row Name 05/19/17 1328 05/25/17 1535 06/01/17 0614 06/29/17 0614 07/19/17 0820   ITP Comments  Med review completed. Initial ITP created. Diagnosis can be found in [facility] 4/9  Bethena Roys called to leave a message about  not coming to class. She was in the ED yesterday and determined to have a kidney stone.  She is on a new medication to help and has a follow up appointment with radiology on Friday. She is going to stay out until she is able to pass the stone or relieve the pain.  Bethena Roys will keep [place] up to date with what is going on with her.   30 day review. Continue with ITP unless directed changes per Medical Director  New to program   Med review only  30 day review. Continue with ITP unless directed changes per Medical Director review   Only 3 visits in June  Judy informed staff today that she has been diagnosed with CLL.  She sees her Dr in 2 weeks to follow up.  Staff will work with Bethena Roys once she knows her treatment plan to move forward.   [place] Name 07/27/17 0549 08/11/17 0921         ITP Comments  30 day review. Continue with ITP unless directed changes per Medical Director review.  Judy plans to complete program before starting oncology treatment plan  Discharge ITP sent and signed by Dr. Sabra Heck.  Discharge Summary routed to PCP and cardiologist.         Comments: Discharge ITP

## 2017-11-15 DIAGNOSIS — I25119 Atherosclerotic heart disease of native coronary artery with unspecified angina pectoris: Secondary | ICD-10-CM

## 2017-11-15 DIAGNOSIS — N131 Hydronephrosis with ureteral stricture, not elsewhere classified: Secondary | ICD-10-CM | POA: Diagnosis not present

## 2017-11-15 DIAGNOSIS — I5032 Chronic diastolic (congestive) heart failure: Secondary | ICD-10-CM

## 2017-11-15 DIAGNOSIS — I749 Embolism and thrombosis of unspecified artery: Secondary | ICD-10-CM

## 2017-11-15 DIAGNOSIS — E1142 Type 2 diabetes mellitus with diabetic polyneuropathy: Secondary | ICD-10-CM

## 2017-11-15 DIAGNOSIS — G8194 Hemiplegia, unspecified affecting left nondominant side: Secondary | ICD-10-CM

## 2017-11-23 DIAGNOSIS — Z452 Encounter for adjustment and management of vascular access device: Secondary | ICD-10-CM

## 2017-12-23 IMAGING — RF DG ESOPHAGUS
4 series · 14 of 14 positions shown · non-contrast
Comparison: None.

CLINICAL DATA: History of upper GI bleed, lap band

EXAM:
ESOPHOGRAM/BARIUM SWALLOW
TECHNIQUE: Single contrast examination was performed using  thin barium.
FLUOROSCOPY TIME:  Fluoroscopy Time:  54 seconds
Radiation Exposure Index (if provided by the fluoroscopic device):
126 mGy
Number of Acquired Spot Images: 0

[Series 1: sequence · 4 of 30 frames shown (1 of 3)]
[frame 5/30]
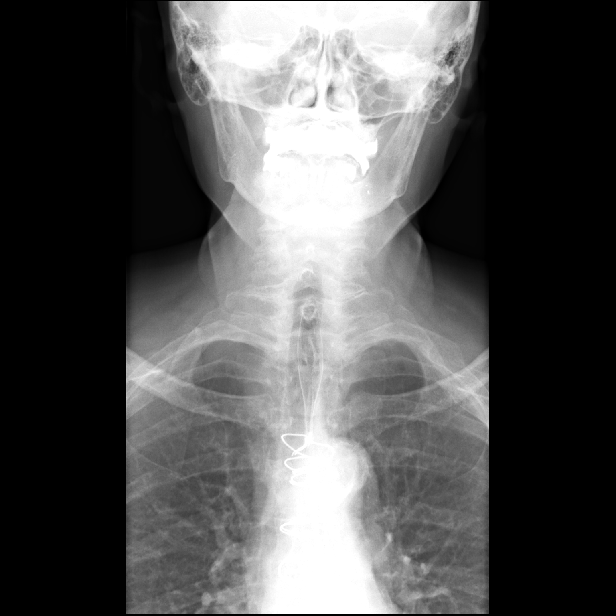
[frame 7/30]
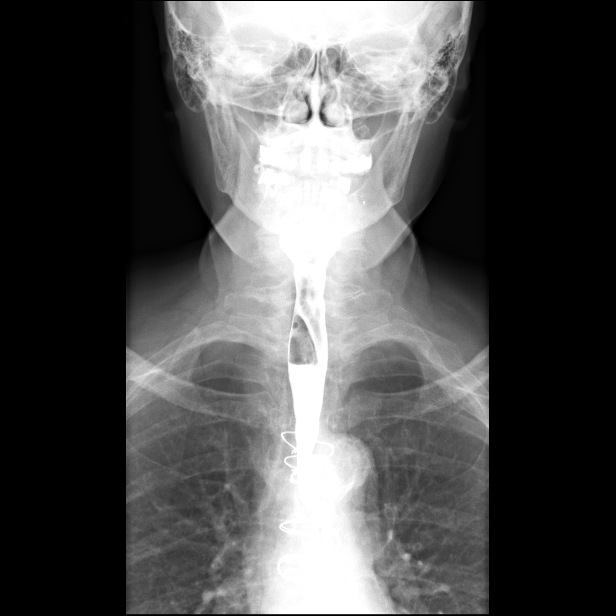
[frame 16/30]
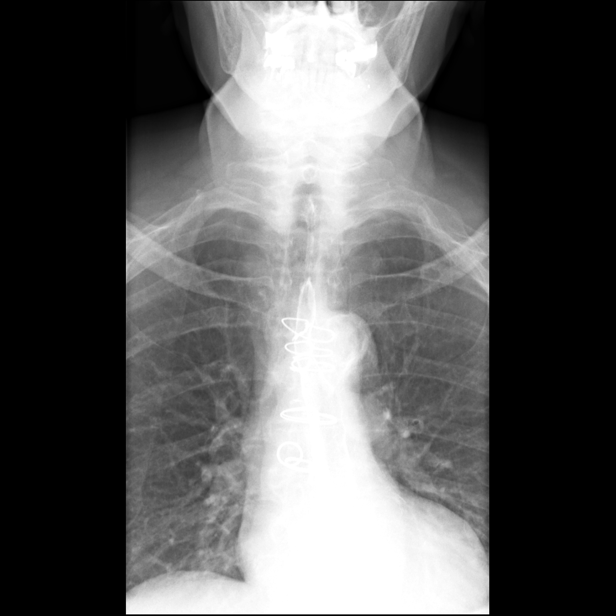
[frame 26/30]
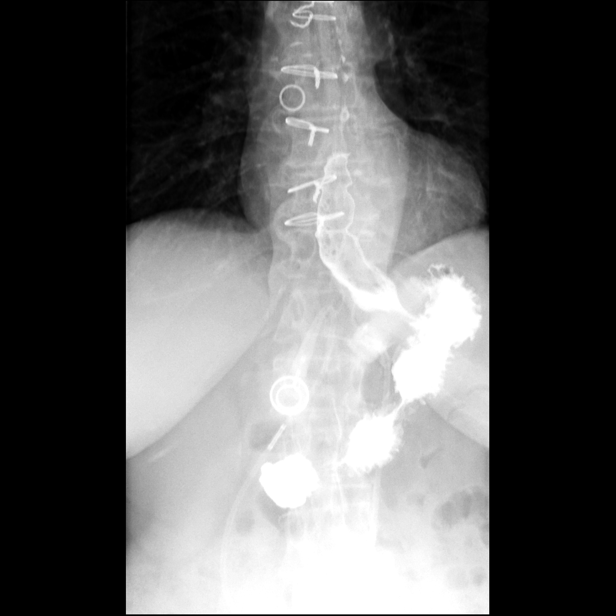

[Series 2: sequence · 4 of 23 frames shown (2 of 3)]
[frame 4/23]
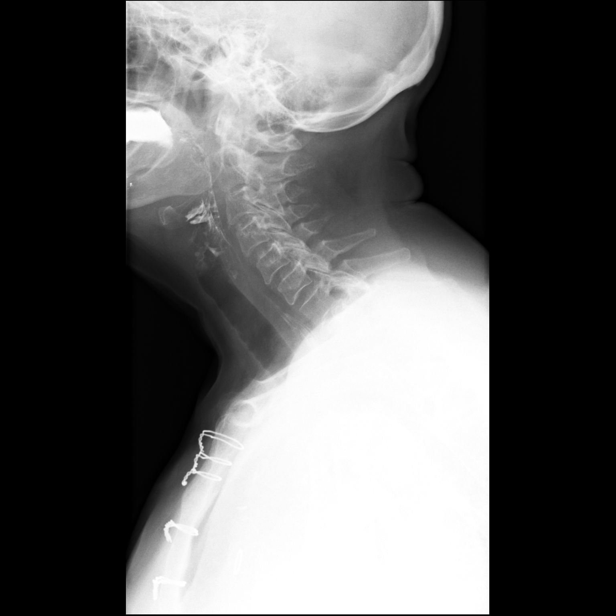
[frame 12/23]
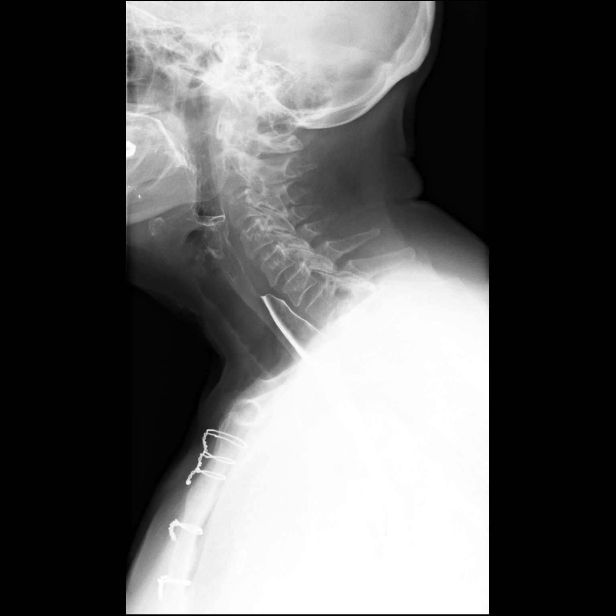
[frame 17/23]
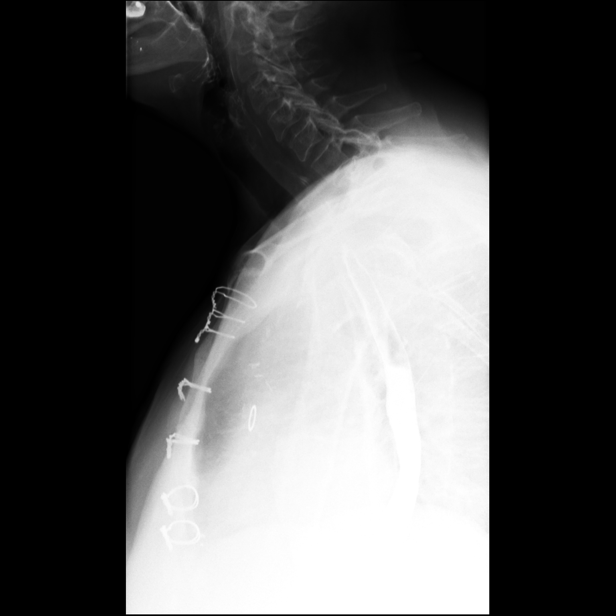
[frame 20/23]
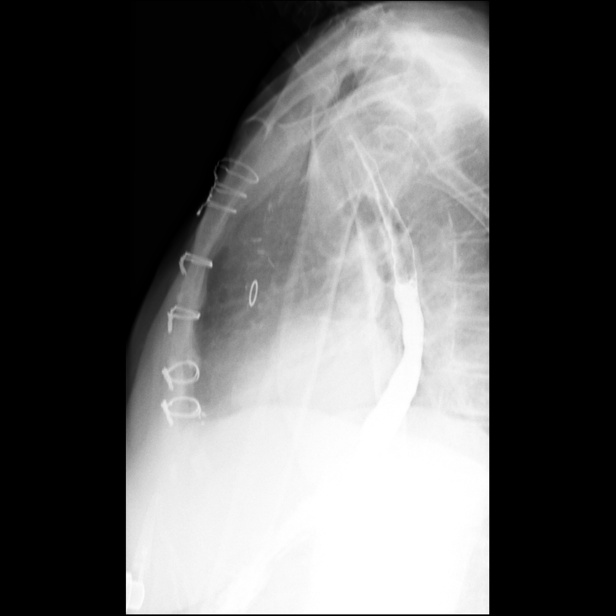

[Series 3: sequence · 4 of 85 frames shown (3 of 3)]
[frame 5/85]
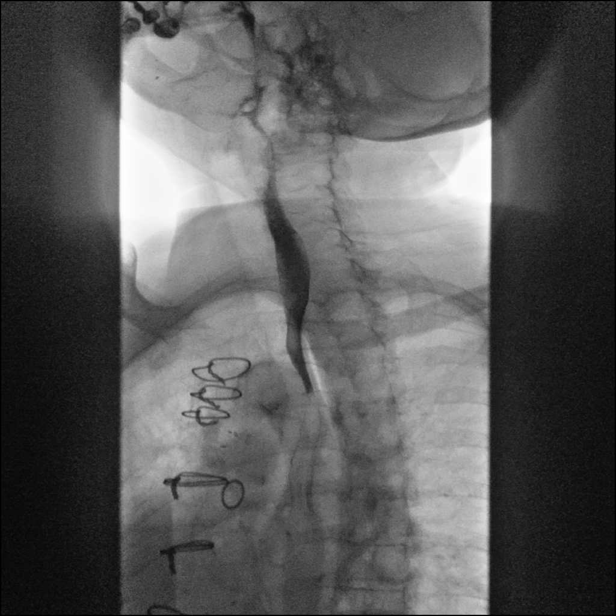
[frame 13/85]
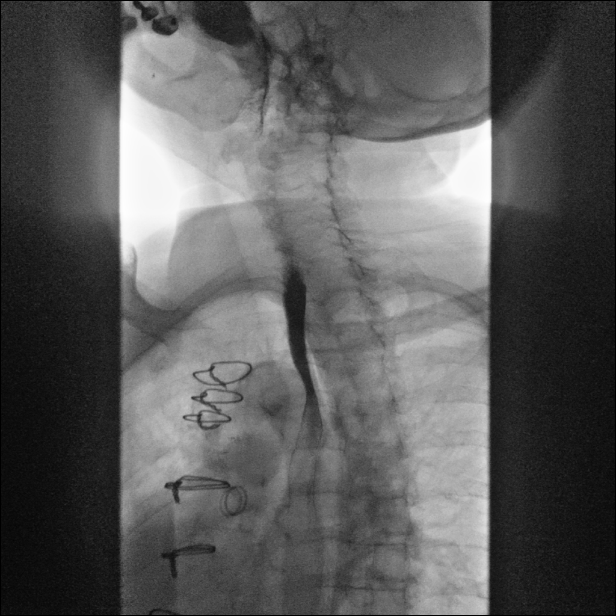
[frame 43/85]
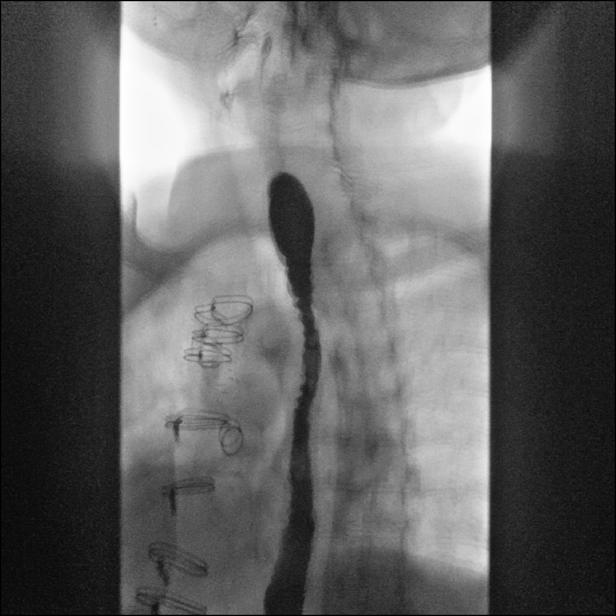
[frame 73/85]
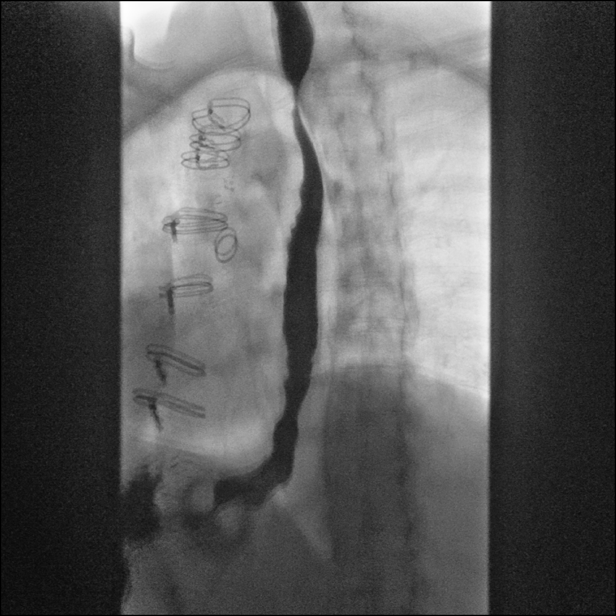

[Series 4: one shot · 2 of 2 slices shown]
[im 1/2]
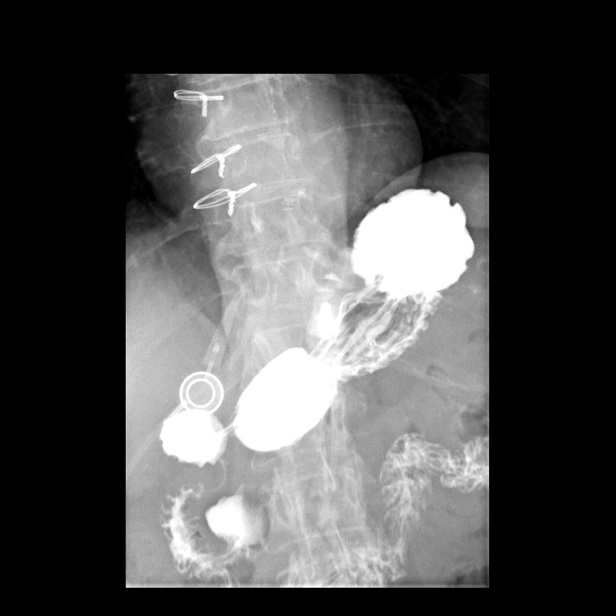
[im 2/2]
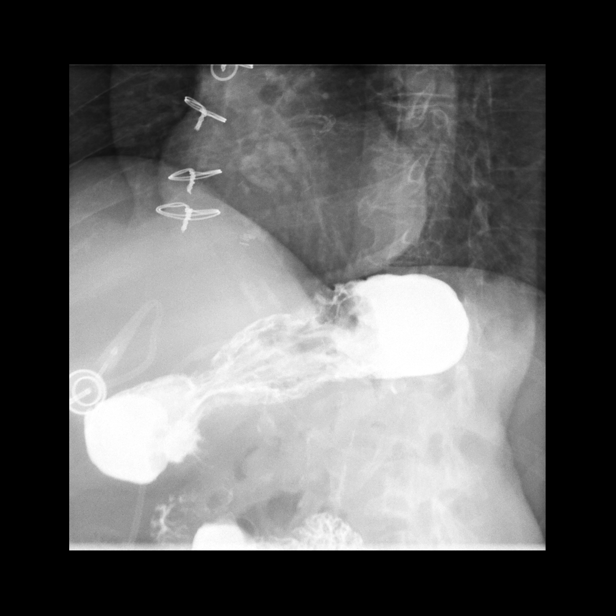

[14 of 14 positions shown; findings below may reference images not displayed]

FINDINGS: Initially rapid sequence spot films of the cervical esophagus were
performed in frontal and lateral projections. The swallowing
mechanism is unremarkable. There are mild tertiary contractions in
the mid and distal esophagus. No hiatal hernia is seen. No
gastroesophageal reflux is demonstrated. Barium passes through the
lap band into the stomach without significant delay.
IMPRESSION: 1. Mild tertiary contractions in the mid and distal esophagus.
2. No hiatal hernia.  No gastroesophageal reflux.

## 2018-01-11 ENCOUNTER — Other Ambulatory Visit: Payer: Self-pay

## 2018-01-11 ENCOUNTER — Emergency Department
Admission: EM | Admit: 2018-01-11 | Discharge: 2018-01-11 | Disposition: A | Discharge status: Home / Self Care | Payer: Medicare Other | Attending: Emergency Medicine | Admitting: Emergency Medicine

## 2018-01-11 ENCOUNTER — Emergency Department: Payer: Medicare Other

## 2018-01-11 DIAGNOSIS — Z955 Presence of coronary angioplasty implant and graft: Secondary | ICD-10-CM | POA: Insufficient documentation

## 2018-01-11 DIAGNOSIS — Z951 Presence of aortocoronary bypass graft: Secondary | ICD-10-CM | POA: Insufficient documentation

## 2018-01-11 DIAGNOSIS — Z79899 Other long term (current) drug therapy: Secondary | ICD-10-CM | POA: Insufficient documentation

## 2018-01-11 DIAGNOSIS — Z7984 Long term (current) use of oral hypoglycemic drugs: Secondary | ICD-10-CM | POA: Diagnosis not present

## 2018-01-11 DIAGNOSIS — E119 Type 2 diabetes mellitus without complications: Secondary | ICD-10-CM | POA: Insufficient documentation

## 2018-01-11 DIAGNOSIS — Z7982 Long term (current) use of aspirin: Secondary | ICD-10-CM | POA: Diagnosis not present

## 2018-01-11 DIAGNOSIS — I252 Old myocardial infarction: Secondary | ICD-10-CM | POA: Diagnosis not present

## 2018-01-11 DIAGNOSIS — Z7902 Long term (current) use of antithrombotics/antiplatelets: Secondary | ICD-10-CM | POA: Insufficient documentation

## 2018-01-11 DIAGNOSIS — R197 Diarrhea, unspecified: Secondary | ICD-10-CM | POA: Diagnosis not present

## 2018-01-11 DIAGNOSIS — I110 Hypertensive heart disease with heart failure: Secondary | ICD-10-CM | POA: Diagnosis not present

## 2018-01-11 DIAGNOSIS — I2510 Atherosclerotic heart disease of native coronary artery without angina pectoris: Secondary | ICD-10-CM | POA: Diagnosis not present

## 2018-01-11 DIAGNOSIS — I509 Heart failure, unspecified: Secondary | ICD-10-CM | POA: Diagnosis not present

## 2018-01-11 DIAGNOSIS — R110 Nausea: Secondary | ICD-10-CM

## 2018-01-11 DIAGNOSIS — R112 Nausea with vomiting, unspecified: Secondary | ICD-10-CM | POA: Diagnosis present

## 2018-01-11 LAB — COMPREHENSIVE METABOLIC PANEL
ALT: 11 U/L (ref 0–44)
AST: 23 U/L (ref 15–41)
Albumin: 4.1 g/dL (ref 3.5–5.0)
Alkaline Phosphatase: 78 U/L (ref 38–126)
Anion gap: 11 (ref 5–15)
BILIRUBIN TOTAL: 0.9 mg/dL (ref 0.3–1.2)
BUN: 9 mg/dL (ref 8–23)
CALCIUM: 9.3 mg/dL (ref 8.9–10.3)
CO2: 24 mmol/L (ref 22–32)
CREATININE: 0.97 mg/dL (ref 0.44–1.00)
Chloride: 105 mmol/L (ref 98–111)
GFR calc Af Amer: >60 mL/min (ref >60)
GFR calc non Af Amer: 57 mL/min — ABNORMAL LOW (ref >60)
Glucose, Bld: 171 mg/dL — ABNORMAL HIGH (ref 70–99)
Potassium: 3.2 mmol/L — ABNORMAL LOW (ref 3.5–5.1)
Sodium: 140 mmol/L (ref 135–145)
Total Protein: 7.3 g/dL (ref 6.5–8.1)

## 2018-01-11 LAB — CBC
HCT: 37.4 % (ref 36.0–46.0)
Hemoglobin: 11.5 g/dL — ABNORMAL LOW (ref 12.0–15.0)
MCH: 24.9 pg — ABNORMAL LOW (ref 26.0–34.0)
MCHC: 30.7 g/dL (ref 30.0–36.0)
MCV: 81.1 fL (ref 80.0–100.0)
PLATELETS: 207 10*3/uL (ref 150–400)
RBC: 4.61 MIL/uL (ref 3.87–5.11)
RDW: 15.3 % (ref 11.5–15.5)
WBC: 6.9 10*3/uL (ref 4.0–10.5)
nRBC: 0 % (ref 0.0–0.2)

## 2018-01-11 LAB — URINALYSIS, COMPLETE (UACMP) WITH MICROSCOPIC
Bacteria, UA: NONE SEEN
Bilirubin Urine: NEGATIVE
Glucose, UA: 50 mg/dL — AB
HGB URINE DIPSTICK: NEGATIVE
Ketones, ur: 20 mg/dL — AB
Leukocytes, UA: NEGATIVE
Nitrite: NEGATIVE
Protein, ur: NEGATIVE mg/dL
Specific Gravity, Urine: 1.008 (ref 1.005–1.030)
pH: 8 (ref 5.0–8.0)

## 2018-01-11 LAB — LIPASE, BLOOD: Lipase: 22 U/L (ref 11–51)

## 2018-01-11 LAB — TROPONIN I: Troponin I: <0.03 ng/mL (ref <0.03)

## 2018-01-11 MED ORDER — ONDANSETRON HCL 4 MG/2ML IJ SOLN
4.0000 mg | Freq: Once | INTRAMUSCULAR | Status: DC
  Filled 2018-01-11: qty 2

## 2018-01-11 MED ORDER — PROMETHAZINE HCL 25 MG/ML IJ SOLN
12.5000 mg | Freq: Once | INTRAMUSCULAR | Status: AC
  Administered 2018-01-11: 12.5 mg via INTRAMUSCULAR
  Filled 2018-01-11: qty 1

## 2018-01-11 MED ORDER — SODIUM CHLORIDE 0.9 % IV BOLUS: 500.0000 mL | Freq: Once | INTRAVENOUS | Status: DC

## 2018-01-11 MED ORDER — PROMETHAZINE HCL 12.5 MG PO TABS: 12.5000 mg | ORAL_TABLET | Freq: Four times a day (QID) | ORAL | 0 refills | Status: DC

## 2018-01-11 NOTE — ED Notes (Signed)
Pt ambulatory to restroom with steady gait noted  

## 2018-01-11 NOTE — ED Provider Notes (Addendum)
[facility] Emergency Department Provider Note  ____________________________________________   I have reviewed the triage vital signs and the nursing notes. Where available I have reviewed prior notes and, if possible and indicated, outside hospital notes.    HISTORY  Chief Complaint Nausea and Emesis    HPI [name] is a 78 y.o. female history of CAD, status post CABG in 2017 CHF chronic pain from multiple myeloma, high cholesterol, who presents today complaining of nausea vomiting and diarrhea for the last couple days.  No fever no abdominal pain no melena no bright red blood per rectum no hematemesis.  She denies any focal discomfort.  Nothing makes it better nothing makes it worse, she denies headache stiff neck chest pain shortness of breath or any anginal equivalent.  She denies numbness or weakness.  She has no recent  travel she states.  She just feels weak little generally from volume depletion.  Lives at home, a large community burden of similar  EMS reports normal vitals and sugars  ----------------------------------------- 9:56 AM on 01/11/2018 -----------------------------------------   Patient did not initially reveal this but it comes out now that she was actually seen in the emergency room late last night and discharged this morning from a different facility for chronic nausea.  They gave her Zofran to tolerate p.o. at home blood work was reassuring.  Patient has a history of chronic nausea was admitted to the hospital for chronic nausea a few weeks ago, it is revealed that the patient has been taking clindamycin starting 2 days ago, and that might be what is making her nausea.  She reported to them dry heaving but no vomiting.  She did not report to them the loose stool which she tells me she has had.  According to them her last bowel movement was normal, in any event, history is somewhat variable but patient is a patient with persistent  recurrent nausea.  She did have Phenergan and Zofran from her primary care doctor prior to going to the outside hospital where she was receiving antiemetics and now she is here.  This is her third different encounter therefore in 24 hours for nausea without demonstrated vomiting   Past Medical History:  Diagnosis Date  . Anginal pain (HCC)   . Anxiety   . Arthritis    "shoulders primarily" (01/13/2016)  . CAD (coronary artery disease)    s/p CABG in Oct 2017  . CHF (congestive heart failure) (HCC)   . Chronic pain    from her multiple myeloma but says that her pain is usually in her legs/notes 01/13/2016  . High cholesterol   . History of blood transfusion    "numerous; related to procedures for my heart" (01/13/2016)  . Hypertension   . Ischemic cardiomyopathy    /nots 01/13/2016  . MI (myocardial infarction) (HCC)    EKG on arrival 01/13/2016 showed NSR with evidence of old anterior and inferior infarcts  . MI (myocardial infarction) (HCC) 08/2015  . Multiple myeloma (HCC) dx'd 2015   off chemo since 2016  . Nausea and vomiting 05/10/2016   3-4 month hx of nausea and vomiting  . PAD (peripheral artery disease) (HCC)   . Stroke (HCC)    "several in 1 year; ? year" (01/13/2016)  . Type II diabetes mellitus (HCC)     Patient Active Problem List   Diagnosis Date Noted  . Nausea and vomiting 10/05/2016  . Nausea & vomiting 10/04/2016  . Fistula 09/30/2016  .   Intractable nausea and vomiting 09/18/2016  . Lower extremity edema 05/10/2016  . Ischemic cardiomyopathy 02/23/2016  . Chills 02/23/2016  . Weight loss 02/23/2016  . Unstable angina pectoris (HCC) 01/13/2016  . Chest pain   . PVD (peripheral vascular disease) (HCC)   . Coronary artery disease involving coronary bypass graft of native heart with angina pectoris (HCC)   . Peripheral arterial occlusive disease (HCC) 12/15/2015  . Acute blood loss anemia 11/13/2015  . Leukocytosis 11/13/2015  . Chronic pain 11/12/2015  . Acute  postoperative pain 11/12/2015  . 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 Acute UTI 11/05/2015  . Edema of left lower extremity 06/16/2015  . Edema of upper extremity 06/16/2015  . Knee pain, left 06/14/2015  . AKI (acute kidney injury) (HCC) 06/11/2015  . Hyperkalemia 06/11/2015  . Hypomagnesemia 06/11/2015  . Acute leg pain, left 05/20/2015  . Multiple bruises 05/20/2015  . Recurrent urinary tract infection 03/26/2014  . Recurrent UTI 03/26/2014  . H/O gastric bypass 11/07/2013  . Peripheral neuropathy 10/10/2013  . Encounter for antineoplastic chemotherapy 10/10/2013  . Multiple myeloma (HCC) 07/16/2013  . Critical ischemia of lower extremity 05/15/2013  . Critical lower limb ischemia 05/15/2013  . Intermittent claudication (HCC) 07/27/2011  . Chronic coronary artery disease 04/26/2011  . Gastroesophageal reflux disease 04/26/2011  . Type 2 diabetes mellitus (HCC) 03/30/2011  . Hyperlipidemia, unspecified 03/30/2011  . Hypertension 03/30/2011    Past Surgical History:  Procedure Laterality Date  . CARDIAC CATHETERIZATION N/A 01/14/2016   Procedure: Left Heart Cath and Cors/Grafts Angiography;  Surgeon: Christopher D McAlhany, MD;  Location: [facility] INVASIVE CV LAB;  Service: Cardiovascular;  Laterality: N/A;  . CARPAL TUNNEL RELEASE Left   . CORONARY ANGIOPLASTY WITH STENT PLACEMENT  05/2015   DES placed to the mid and distal LAD as well as OM2/notes 01/13/2016  . CORONARY ANGIOPLASTY WITH STENT PLACEMENT  ~ 2010  . CORONARY ARTERY BYPASS GRAFT  11/11/2015   CABG X2 LIMA-LAD and SVG-OM/notes 01/13/2016  . DILATION AND CURETTAGE OF UTERUS    . FEMORAL-POPLITEAL BYPASS GRAFT Left 2013   /notes 01/13/2016  . LAPAROSCOPIC GASTRIC BANDING  2007  . RENAL ARTERY STENT    . VAGINAL HYSTERECTOMY      Prior to Admission medications   Medication Sig Start Date End Date Taking? Authorizing Provider  Alirocumab (PRALUENT) 75 MG/ML SOPN Inject 1 pen into the skin every 14 (fourteen) days. STUDY MEDICATION  05/06/16   Nelson, Katarina H, MD  aspirin 81 MG tablet Take 81 mg by mouth daily.    [provider]  Cholecalciferol (VITAMIN D3) 50000 units CAPS Take 1 capsule by mouth 2 (two) times a week. Sunday AND Wednesday    [provider]  clopidogrel (PLAVIX) 75 MG tablet  04/13/17   [provider]  diclofenac sodium (VOLTAREN) 1 % GEL Apply 2 g topically daily as needed for pain.    [provider]  feeding supplement, ENSURE ENLIVE, (ENSURE ENLIVE) LIQD Take 237 mLs by mouth 2 (two) times daily between meals. 10/06/16   Vann, Jessica U, DO  fentaNYL (DURAGESIC - DOSED MCG/HR) 50 MCG/HR Place 50 mcg onto the skin every 3 (three) days.     [provider]  fluticasone (FLONASE) 50 MCG/ACT nasal spray Place 2 sprays into both nostrils daily as needed for allergies.  03/08/16   [provider]  furosemide (LASIX) 20 MG tablet Take 1 tablet (20 mg total) by mouth daily as needed for fluid or edema.   05/10/16 10/04/16  Dorothy Spark, MD  gabapentin (NEURONTIN) 300 MG capsule Take 300-600 mg by mouth See admin instructions. Pt takes 636m in am, 3059mat bedtime    [provider]  hydrocortisone cream 1 % Apply topically as needed for itching. 01/15/16   SmArbutus LeasNP  isosorbide mononitrate (IMDUR) 60 MG 24 hr tablet Take 1 tablet (60 mg total) by mouth daily. Please make yearly appt with Dr. NeMeda Coffeeor September before anymore refills. 1st attempt 07/25/17   NeDorothy SparkMD  lisinopril (PRINIVIL,ZESTRIL) 5 MG tablet Take 1 tablet (5 mg total) by mouth daily. 01/16/16   SmArbutus LeasNP  metFORMIN (GLUCOPHAGE) 500 MG tablet Take 500 mg by mouth 2 (two) times daily with a meal.     [provider]  metoprolol succinate (TOPROL-XL) 25 MG 24 hr tablet Take by mouth.    [provider]  metoprolol tartrate (LOPRESSOR) 25 MG tablet Take 0.5 tablets (12.5 mg total) by mouth 2 (two) times daily. 02/23/16 09/17/16  NeDorothy SparkMD  nitroGLYCERIN (NITROSTAT) 0.4 MG SL tablet Place 1 tablet (0.4 mg total) under the tongue every 5 (five) minutes as needed for chest pain. 01/15/16   SmArbutus LeasNP  Omega-3 Fatty Acids (FISH OIL) 1000 MG CAPS Take 2 capsules (2,000 mg total) by mouth daily. 05/06/16   NeDorothy SparkMD  ondansetron (ZOFRAN ODT) 8 MG disintegrating tablet Take 1 tablet (8 mg total) by mouth every 8 (eight) hours as needed for nausea or vomiting. 10/11/16   LeLevin ErpPA  pantoprazole (PROTONIX) 40 MG tablet Take 1 tablet (40 mg total) by mouth daily at 6 (six) AM. 10/07/16   VaGeradine GirtDO  promethazine (PHENERGAN) 12.5 MG tablet Take 1 tablet (12.5 mg total) by mouth every 6 (six) hours. 10/06/16   VaGeradine GirtDO  Vitamin D, Ergocalciferol, (DRISDOL) 50000 units CAPS capsule  05/06/17   [provider]    Allergies Other; Statins; Metoclopramide; Amoxicillin; Codeine; Sulfa antibiotics; and Duloxetine  Family History  Problem Relation Age of Onset  . Diabetes Mother   . Heart failure Mother   . Prostate cancer Neg Hx   . Kidney cancer Neg Hx   . Bladder Cancer Neg Hx   . Colon cancer Neg Hx   . Stomach cancer Neg Hx     Social History Social History   Tobacco Use  . Smoking status: Never Smoker  . Smokeless tobacco: Never Used  Substance Use Topics  . Alcohol use: Yes    Comment: occasional wine  . Drug use: No    Review of Systems Constitutional: No fever/chills Eyes: No visual changes. ENT: No sore throat. No stiff neck no neck pain Cardiovascular: Denies chest pain. Respiratory: Denies shortness of breath. Gastrointestinal: See HPI Genitourinary: Negative for dysuria. Musculoskeletal: Negative lower extremity swelling Skin: Negative for rash. Neurological: Negative for severe headaches, focal weakness or numbness.   ____________________________________________   PHYSICAL EXAM:  VITAL SIGNS: ED Triage Vitals [01/11/18 0901]  Enc Vitals  Group     BP (!) 179/58     Pulse Rate 75     Resp 16     Temp 98.8 F (37.1 C)     Temp Source Oral     SpO2 96 %     Weight 160 lb (72.6 kg)     Height 5' 2" (1.575 m)     Head Circumference  Peak Flow      Pain Score 0     Pain Loc      Pain Edu?      Excl. in [place]?     Constitutional: Alert and oriented. Well appearing and in no acute distress. Eyes: Conjunctivae are normal Head: Atraumatic HEENT: No congestion/rhinnorhea. Mucous membranes are very slightly dry.  Oropharynx non-erythematous Neck:   Nontender with no meningismus, no masses, no stridor Cardiovascular: Normal rate, regular rhythm. Grossly normal heart sounds.  Good peripheral circulation. Respiratory: Normal respiratory effort.  No retractions. Lungs CTAB. Abdominal: Soft and nontender. No distention. No guarding no rebound Back:  There is no focal tenderness or step off.  there is no midline tenderness there are no lesions noted. there is no CVA tenderness Musculoskeletal: No lower extremity tenderness, no upper extremity tenderness. No joint effusions, no DVT signs strong distal pulses no edema Neurologic:  Normal speech and language. No gross focal neurologic deficits are appreciated.  Skin:  Skin is warm, dry and intact. No rash noted. Psychiatric: Mood and affect are normal. Speech and behavior are normal.  ____________________________________________   LABS (all labs ordered are listed, but only abnormal results are displayed)  Labs Reviewed  URINALYSIS, COMPLETE (UACMP) WITH MICROSCOPIC - Abnormal; Notable for the following components:      Result Value   Color, Urine STRAW (*)    APPearance CLEAR (*)    Glucose, UA 50 (*)    Ketones, ur 20 (*)    All other components within normal limits  LIPASE, BLOOD  COMPREHENSIVE METABOLIC PANEL  CBC  TROPONIN I    Pertinent labs  results that were available during my care of the patient were reviewed by me and considered in my medical decision making  (see chart for details). ____________________________________________  EKG  I personally interpreted any EKGs ordered by me or triage EKG shows sinus rhythm rate 84 bpm initial EKG could not be read because of artifact and lead wander however repeat EKG shows normal axis, rate 84 no acute ischemic changes ____________________________________________  RADIOLOGY  Pertinent labs & imaging results that were available during my care of the patient were reviewed by me and considered in my medical decision making (see chart for details). If possible, patient and/or family made aware of any abnormal findings.  No results found. ____________________________________________    PROCEDURES  Procedure(s) performed: None  Procedures  Critical Care performed: None  ____________________________________________   INITIAL IMPRESSION / ASSESSMENT AND PLAN / ED COURSE  Pertinent labs & imaging results that were available during my care of the patient were reviewed by me and considered in my medical decision making (see chart for details).  Patient here with persistent chronic nausea, abdomen is completely benign I do not think that this meets criteria for "pain out of proportion to exam" and I do not think that she has [place].  Patient has chronic persistent nausea which may be medication related.  She is quite well-appearing otherwise, given her history we will recheck blood work including cardiac enzymes to make sure that this is not an atypical presentation of CAD but if it is negative I do not think serial enzymes will be indicated.  Blood work and vital signs remained reassuring and she can tolerate p.o. is my hope and get her home.  There is no beds in this hospital at this time she would be a perpetual border in the emergency room if she were admitted and I think hopefully her home would  be a better destination for her if we can safely get her there.     ----------------------------------------- 11:51 AM on 01/11/2018 -----------------------------------------  And eating and drinking with no difficulty in the emergency room.  There was some difficulty getting an IV she declined further times she states she feels fine and wants to go home she is not nauseated.  We talked about admission but given the high level of flu and the low level of bed she very strongly would prefer to go home which I do not think is unreasonable.  However, we have given her extensive return precautions and advised her to be vigilant about her health.  I will refill her Phenergan prescription.  This is a chronic problem, they do not see any acute pathology today that requires admission.  Her belly is benign.  Considering the patient's symptoms, medical history, and physical examination today, I have low suspicion for cholecystitis or biliary pathology, pancreatitis, perforation or bowel obstruction, hernia, intra-abdominal abscess, AAA or dissection, volvulus or intussusception, mesenteric ischemia, ischemic gut, pyelonephritis or appendicitis. ____________________________________________   FINAL CLINICAL IMPRESSION(S) / ED DIAGNOSES  Final diagnoses:  None      This chart was dictated using voice recognition software.  Despite best efforts to proofread,  errors can occur which can change meaning.     McShane, James A, MD 01/11/18 0959    McShane, James A, MD 01/11/18 1152  

## 2018-01-11 NOTE — ED Triage Notes (Signed)
Arrives to ER via [facility] from home c/o NV that began today. Denies abdominal pain. Mild diarrhea. VSS with EMS. CBG 190 with EMS.

## 2018-01-11 NOTE — ED Notes (Signed)
Patient has not had vomiting or diarrhea since arriving to the hospital today.

## 2018-01-11 NOTE — ED Notes (Signed)
Difficulty establishing an IV.  THis RN attempted x 2.  Radonna Ricker, RN attempted x 2.  Dr. Burlene Arnt notified.  Will continue to monitor.

## 2018-01-11 NOTE — Discharge Instructions (Signed)
Please follow with your primary care doctor tomorrow, return to the emergency room for any new or worrisome symptoms including persistent vomiting, fever, abdominal pain, or if you feel worse in any way.

## 2018-01-11 NOTE — ED Notes (Signed)
Pt given water at this time for PO challenge per MD [facility]

## 2018-01-12 ENCOUNTER — Ambulatory Visit: Payer: Medicare Other | Admitting: Physician Assistant

## 2018-01-13 LAB — URINE CULTURE

## 2018-01-25 ENCOUNTER — Encounter: Payer: Medicare Other | Attending: Internal Medicine | Admitting: Internal Medicine

## 2018-03-01 ENCOUNTER — Encounter: Payer: Medicare Other | Attending: Internal Medicine | Admitting: Internal Medicine

## 2018-03-01 DIAGNOSIS — I509 Heart failure, unspecified: Secondary | ICD-10-CM | POA: Diagnosis not present

## 2018-03-01 DIAGNOSIS — L89626 Pressure-induced deep tissue damage of left heel: Secondary | ICD-10-CM | POA: Insufficient documentation

## 2018-03-01 DIAGNOSIS — I2510 Atherosclerotic heart disease of native coronary artery without angina pectoris: Secondary | ICD-10-CM | POA: Insufficient documentation

## 2018-03-01 DIAGNOSIS — C9000 Multiple myeloma not having achieved remission: Secondary | ICD-10-CM | POA: Insufficient documentation

## 2018-03-01 DIAGNOSIS — I110 Hypertensive heart disease with heart failure: Secondary | ICD-10-CM | POA: Diagnosis not present

## 2018-03-01 DIAGNOSIS — L97418 Non-pressure chronic ulcer of right heel and midfoot with other specified severity: Secondary | ICD-10-CM | POA: Insufficient documentation

## 2018-03-02 NOTE — Progress Notes (Signed)
[name], [name] ([id number]) Visit Report for 03/01/2018 Chief Complaint Document Details Patient Name: [name], [name] Date of Service: 03/01/2018 2:45 PM Medical Record Number: [id number] Patient Account Number: [id number] Date of Birth/Sex: 02-Aug-1940 (78 y.o. F) Treating RN: Cornell Barman Primary Care Provider: Lavera [name] Other Clinician: Referring Provider: Referral, Self Treating Provider/Extender: Tito Dine in Treatment: 0 Information Obtained from: Patient Chief Complaint She is here in follow up evaluation for a left heel ulcer 03/01/2018; patient is here for review of bilateral painful heel issues Electronic Signature(s) Signed: 03/01/2018 6:18:32 PM By: [name] MD Entered By: [name] on 03/01/2018 15:54:03 Mahler, Lennon A. ([id number]) -------------------------------------------------------------------------------- HPI Details Patient Name: [name], [name] Date of Service: 03/01/2018 2:45 PM Medical Record Number: [id number] Patient Account Number: [id number] Date of Birth/Sex: 1940/10/17 (78 y.o. F) Treating RN: Cornell Barman Primary Care Provider: Lavera [name] Other Clinician: Referring Provider: Referral, Self Treating Provider/Extender: Tito Dine in Treatment: 0 History of Present Illness HPI Description: 12/24/15; [name] is a 78 year old woman who is the mother of one of our other clinic patient's [name] He comes [place] today for review a nonhealing surgical CABG wound. The patient underwent CABG o2 on October 31 17 at [facility]. She apparently had had a myocardial infarction in October but briefly presented with shortness of breath. She underwent surgery which I understand was uneventful. According to the patient her surgical incision never really healed in 2 spots. One is at the inferior recess question drain site, the other more superiorly about an inch from the superior surface of this it incision. She has been  followed by her surgeon at [facility] who felt that both areas which he'll according to the patient. She was put on Augmentin 2 weeks ago and she is finished a course of this culture at that time showed Escherichia coli which should've been sensitive. The patient also relates that she has a history of allergy to ampicillin. Besides this the patient has a history of type 2 diabetes on metformin, multiple myeloma. She had a chest x-ray in mid October the did not show any abnormalities. I cannot see that she had any specific views of the sternum however. We have her referral note from her primary physician Dr. Bernie Covey. She was concerned about erythema around the wound and as mentioned the culture here showed Escherichia coli which should have been Augmentin sensitive but was resistant to ampicillin, Keflex third generation cephalosporins quinolones and sulfonamides. [ESBL). The patient does not complain of fever or chills excessive pain. She does say that the lower surgical wound has been draining what looks to her like pus. She has been using triple antibiotic cream. 12/31/15 superior wound actually looks somewhat better. Inferiorly still a wound with some depth greater than 2 cm. Using Aquacel Ag. Culture I did of the drainage from the inferior wound and this surgical incision last week was negative 01/07/16; superior wound still requires debridement of surface eschar and nonviable subcutaneous tissue. Inferior wound measured at 1.8 cm. What I can see of the tissue here does not look very healthy. Original culture that I did on her presentation was negative however 01/27/16; the superior wound is closed she still has a deep probing wound of roughly 2 cm inferiorly. There is no overt infection no surrounding erythema. The patient tells me that 2 weeks ago she was developing chest pain and underwent a nuclear stress test. This was apparently negative however she developed chest pain shortly thereafter  and had to go have a cardiac catheterization that did not show blockages. Since then she is not having chest pain but has unrelenting nausea making it difficult for her to eat. We reviewed her medications and she is on aspirin and Plavix however she apparently has a stent in place as well. 02/03/16; still using silver alginate. Depth of this is down from 1.8 cm the circumference is smaller and the depth down by 2 mm red no drainage and no surrounding infection 02/10/16; still using silver alginate depth down to 1.3 cm today measured by myself. Appears to be making slow progress. There is no surrounding tenderness.. The patient states she has unrelenting nausea which is nonexertional. Not related to meals. She has had no vomiting. She also tells me she is had previous lap band surgery but does not give clear reflux type symptoms 02/25/16; the wound orifice of this lady's wound on her chest is down to 1 cm in depth and much smaller in terms of the overall diameter. She tells me that she was packing the wound recently and developed quite a bit of bleeding that took several hours to stop she is on Plavix and aspirin. She is not having any exertional symptoms that sound like coronary artery disease. She is complaining of unrelenting nausea but she is being evaluated by GI for this and apparently has an endoscopy planned for March. We have been using iodoform packing 03/03/16; small wound in terms of orifice but measures 1.2 cm in depth, not much different from last time. She still complains of nausea and has an upper GI series books she is not describing exertional chest pain, shortness of breath, nausea or fatigue. 03/16/16; I follow this patient for a nonhealing surgical wound at a site of a previous CABG scar. She developed cellulitis in the incision postoperatively. Today she arrives with the wound orifice to miniscule to even consider measuring a depth. She came in with a Band-Aid over this, she could  not get any of the iodoform packing in the increasingly small orifice 03/31/16; the patient's nonhealing surgical wound at the site of her previous CABG scar/inferior aspect is totally epithelialized. She tells me there was some bleeding out of this which was minimal last week however with careful inspection there is no evidence of any open here everything looks healthy and epithelialize. Surrounding tissue also looks normal Kearn, Daryle A. ([id number]) the patient arrives today with erythema and a significant blister mostly involving the dorsal aspect of her right fifth finger. There were 2 small almost looked like puncture wounds on the anterior aspect of the DIP joint and the medial aspect of the finger. The area was tender and warm. There was no involvement of any joint 04/20/16; The patient returns today for one last followup. Her original chest wound has remained closed which is fortunate, She came in last time with a right 5th finger blister and erythema. she did not tolerate the aquacel AG. C+S I did was negative. since I last saw her she has a skin tear on the right lateral elbow while doing a medical test READMISSION 03/23/17;Mrs. Weyrauch is a now 78 year old woman that we cared for in this clinic about a year ago with nonhealing wounds at the site of her CABG. She has a type II diabetic with diabetic neuropathy. At the point of this dictation I don't have a hemoglobin A1c however she is only on oral agents. She tells me that she noted a blister on her left lateral  heel about 3 weeks ago. She brought this to attention of her primary doctor and was given DuoDERM. Later on her daughter actually removed the surface of this and some surrounding skin. She is in for our review of this. She does not have a history of wounds on her lower extremities. She does not have known PAD. She does have neuropathy in her feet ABIs in this clinic were noncompressible bilaterally 03/30/17-she is here in follow up  evaluation for a left heel wound. She is voicing no complaints or concerns. She states she could not wear the surgical shoe. She continues to have maceration although not significantly different. We will switch to [facility] and she'll follow-up next week 04/06/17; patient is here for follow-up evaluation of the left heel wound. She has been using Prisma. The area is closed this week. READMISSION 03/01/2018. Patient is back in clinic today. She has type 2 diabetes with diabetic neuropathy. She states over the last week and a half she has noted painful areas on her bilateral outer heels. She has been in this clinic before in March 2019 almost a year ago with areas on her left heel. This healed out very easily. ABIs in our clinic have been consistently noncompressible although she is not known to have a significant arterial issue. She is gone to wearing her open heeled bedroom slippers so as not to put any pressure on the heel although a week or so ago she was walking in shoes that may have contributed to the areas in question. Electronic Signature(s) Signed: 03/01/2018 6:18:32 PM By: [name] MD Entered By: [name] on 03/01/2018 15:55:46 Garczynski, [name] ([id number]) -------------------------------------------------------------------------------- Physical Exam Details Patient Name: Molzahn, Ayrianna A. Date of Service: 03/01/2018 2:45 PM Medical Record Number: [id number] Patient Account Number: [id number] Date of Birth/Sex: 02-13-1940 (77 y.o. F) Treating RN: Cornell Barman Primary Care Provider: Lavera [name] Other Clinician: Referring Provider: Referral, Self Treating Provider/Extender: Tito Dine in Treatment: 0 Constitutional Sitting or standing Blood Pressure is within target range for patient.. Pulse regular and within target range for patient.Marland Kitchen Respirations regular, non-labored and within target range.. Temperature is normal and within the target range for the  patient.Marland Kitchen appears in no distress. Eyes Conjunctivae clear. No discharge. Respiratory Respiratory effort is easy and symmetric bilaterally. Rate is normal at rest and on room air.. Cardiovascular Both popliteal pulses, dorsalis pedis and posterior tibial pulses are all palpable bilaterally although perhaps slightly reduced.. Lymphatic None palpable in the popliteal area bilaterally. Integumentary (Hair, Skin) No primary skin issue is seen. Psychiatric No evidence of depression, anxiety, or agitation. Calm, cooperative, and communicative. Appropriate interactions and affect.. Notes Wound exam; the patient has mirror image problems on her bilateral lateral calcaneus above the plantar surface. On the right she has a denuded blister which is fairly sizable. On the left what looks to be a small firm deep tissue injury. There is no open area. Both of these areas are tender. There is no evidence of surrounding infection Electronic Signature(s) Signed: 03/01/2018 6:18:32 PM By: [name] MD Entered By: [name] on 03/01/2018 15:57:30 Dufner, [name] ([id number]) -------------------------------------------------------------------------------- Physician Orders Details Patient Name: Brandenburger, Makaelyn A. Date of Service: 03/01/2018 2:45 PM Medical Record Number: [id number] Patient Account Number: [id number] Date of Birth/Sex: [birth year] (77 y.o. F) Treating RN: Cornell Barman Primary Care Provider: Lavera [name] Other Clinician: Referring Provider: Referral, Self Treating Provider/Extender: Tito Dine in Treatment: 0 Verbal / Phone Orders: No Diagnosis Coding Wound Cleansing o May  Shower, gently pat wound dry prior to applying new dressing. Follow-up Appointments o Return Appointment in 1 month Off-Loading o Turn and reposition every 2 hours - Keep pressure off of your lateral heels o Other: - Protective boots to wear in the bed to elevate pressure. Electronic  Signature(s) Signed: 03/01/2018 6:18:32 PM By: [name] MD Signed: 03/01/2018 6:42:35 PM By: Gretta Cool, BSN, RN, CWS, Kim RN, BSN Entered By: Gretta Cool, BSN, RN, CWS, Kim on 03/01/2018 15:47:21 Rossini, [name] ([id number]) -------------------------------------------------------------------------------- Problem List Details Patient Name: Hietala, Simya A. Date of Service: 03/01/2018 2:45 PM Medical Record Number: [id number] Patient Account Number: [id number] Date of Birth/Sex: August 22, 1940 (77 y.o. 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 ([id number]) -------------------------------------------------------------------------------- Progress Note Details Patient Name: Lennox, Embree A. Date of Service: 03/01/2018 2:45 PM Medical Record Number: [id number] Patient Account Number: [id number] Date of Birth/Sex: March 22, 1940 (77 y.o. F) Treating RN: Cornell Barman Primary Care Provider: Lavera [name] Other Clinician: Referring Provider: Referral, Self Treating Provider/Extender: Tito Dine in Treatment: 0 Subjective Chief Complaint Information obtained from Patient She is here in follow up evaluation for a left heel ulcer 03/01/2018; patient is here for review of bilateral painful heel issues History of Present Illness (HPI) 12/24/15; Mrs. Gruber is a 78 year old woman who is the mother of one of our other clinic  patient's [name] Madero. He comes [place] today for review a nonhealing surgical CABG wound. The patient underwent CABG o2 on October 31 17 at [facility]. She apparently had had a myocardial infarction in October but briefly presented with shortness of breath. She underwent surgery which I understand was uneventful. According to the patient her surgical incision never really healed in 2 spots. One is at the inferior recess question drain site, the other more superiorly about an inch from the superior surface of this it incision. She has been followed by her surgeon at [facility] who felt that both areas which he'll according to the patient. She was put on Augmentin 2 weeks ago and she is finished a course of this culture at that time showed Escherichia coli which should've been sensitive. The patient also relates that she has a history of allergy to ampicillin. Besides this the patient has a history of type 2 diabetes on metformin, multiple myeloma. She had a chest x-ray in mid October the did not show any abnormalities. I cannot see that she had any specific views of the sternum however. We have her referral note from her primary physician Dr. Bernie Covey. She was concerned about erythema around the wound and as mentioned the culture here showed Escherichia coli which should have been Augmentin sensitive but was resistant to ampicillin, Keflex third generation cephalosporins quinolones and sulfonamides. [ESBL). The patient does not complain of fever or chills excessive pain. She does say that the lower surgical wound has been draining what looks to her like pus. She has been using triple antibiotic cream. 12/31/15 superior wound actually looks somewhat better. Inferiorly still a wound with some depth greater than 2 cm. Using  Aquacel Ag. Culture I did of the drainage from the inferior wound and this surgical incision last week was negative 01/07/16; superior wound still requires debridement  of surface eschar and nonviable subcutaneous tissue. Inferior wound measured at 1.8 cm. What I can see of the tissue here does not look very healthy. Original culture that I did on her presentation was negative however 01/27/16; the superior wound is closed she still has a deep probing wound of roughly 2 cm inferiorly. There is no overt infection no surrounding erythema. The patient tells me that 2 weeks ago she was developing chest pain and underwent a nuclear stress test. This was apparently negative however she developed chest pain shortly thereafter and had to go have a cardiac catheterization that did not show blockages. Since then she is not having chest pain but has unrelenting nausea making it difficult for her to eat. We reviewed her medications and she is on aspirin and Plavix however she apparently has a stent in place as well. 02/03/16; still using silver alginate. Depth of this is down from 1.8 cm the circumference is smaller and the depth down by 2 mm red no drainage and no surrounding infection 02/10/16; still using silver alginate depth down to 1.3 cm today measured by myself. Appears to be making slow progress. There is no surrounding tenderness.. The patient states she has unrelenting nausea which is nonexertional. Not related to meals. She has had no vomiting. She also tells me she is had previous lap band surgery but does not give clear reflux type symptoms 02/25/16; the wound orifice of this lady's wound on her chest is down to 1 cm in depth and much smaller in terms of the overall diameter. She tells me that she was packing the wound recently and developed quite a bit of bleeding that took several hours to stop she is on Plavix and aspirin. She is not having any exertional symptoms that sound like coronary artery disease. She is complaining of unrelenting nausea but she is being evaluated by GI for this and apparently has an endoscopy planned for March. We have been using  iodoform packing 03/03/16; small wound in terms of orifice but measures 1.2 cm in depth, not much different from last time. She still complains of nausea and has an upper GI series books she is not describing exertional chest pain, shortness of breath, nausea or fatigue. Martindale, Rossanna A. ([id number]) 03/16/16; I follow this patient for a nonhealing surgical wound at a site of a previous CABG scar. She developed cellulitis in the incision postoperatively. Today she arrives with the wound orifice to miniscule to even consider measuring a depth. She came in with a Band-Aid over this, she could not get any of the iodoform packing in the increasingly small orifice 03/31/16; the patient's nonhealing surgical wound at the site of her previous CABG scar/inferior aspect is totally epithelialized. She tells me there was some bleeding out of this which was minimal last week however with careful inspection there is no evidence of any open here everything looks healthy and epithelialize. Surrounding tissue also looks normal the patient arrives today with erythema and a significant blister mostly involving the dorsal aspect of her right fifth finger. There were 2 small almost looked like puncture wounds on the anterior aspect of the DIP joint and the medial aspect of the finger. The area was tender and warm. There was no involvement of any joint 04/20/16; The patient returns today for one last followup. Her original chest  wound has remained closed which is fortunate, She came in last time with a right 5th finger blister and erythema. she did not tolerate the aquacel AG. C+S I did was negative. since I last saw her she has a skin tear on the right lateral elbow while doing a medical test READMISSION 03/23/17;Mrs. Vallee is a now 78 year old woman that we cared for in this clinic about a year ago with nonhealing wounds at the site of her CABG. She has a type II diabetic with diabetic neuropathy. At the point of this  dictation I don't have a hemoglobin A1c however she is only on oral agents. She tells me that she noted a blister on her left lateral heel about 3 weeks ago. She brought this to attention of her primary doctor and was given DuoDERM. Later on her daughter actually removed the surface of this and some surrounding skin. She is in for our review of this. She does not have a history of wounds on her lower extremities. She does not have known PAD. She does have neuropathy in her feet ABIs in this clinic were noncompressible bilaterally 03/30/17-she is here in follow up evaluation for a left heel wound. She is voicing no complaints or concerns. She states she could not wear the surgical shoe. She continues to have maceration although not significantly different. We will switch to [facility] and she'll follow-up next week 04/06/17; patient is here for follow-up evaluation of the left heel wound. She has been using Prisma. The area is closed this week. READMISSION 03/01/2018. Patient is back in clinic today. She has type 2 diabetes with diabetic neuropathy. She states over the last week and a half she has noted painful areas on her bilateral outer heels. She has been in this clinic before in March 2019 almost a year ago with areas on her left heel. This healed out very easily. ABIs in our clinic have been consistently noncompressible although she is not known to have a significant arterial issue. She is gone to wearing her open heeled bedroom slippers so as not to put any pressure on the heel although a week or so ago she was walking in shoes that may have contributed to the areas in question. Wound History Patient reportedly has not tested positive for osteomyelitis. Patient reportedly has not had testing performed to evaluate circulation in the legs. Patient History Information obtained from Patient. Allergies Aricept, Benzodiazepines, Opioids - Morphine Analogues, penicillin, Sulfa (Sulfonamide  Antibiotics), Statins-Hmg-Coa Reductase Inhibitors Family History Cancer - Father, Diabetes - Mother, Heart Disease - Mother, Hypertension - Mother, No family history of Hereditary Spherocytosis, Kidney Disease, Lung Disease, Seizures, Stroke, Thyroid Problems, Tuberculosis. Social History Never smoker, Marital Status - Divorced, Alcohol Use - Never, Drug Use - No History, Caffeine Use - Moderate. Medical History Robins, KIMBERELY MCCANNON ([id number]) Eyes Patient has history of Cataracts - removed Denies history of Glaucoma, Optic Neuritis Ear/Nose/Mouth/Throat Denies history of Chronic sinus problems/congestion, Middle ear problems Hematologic/Lymphatic Patient has history of Anemia Denies history of Hemophilia, Human Immunodeficiency Virus, Lymphedema, Sickle Cell Disease Respiratory Denies history of Aspiration, Asthma, Chronic Obstructive Pulmonary Disease (COPD), Pneumothorax, Sleep Apnea, Tuberculosis Cardiovascular Patient has history of Congestive Heart Failure, Coronary Artery Disease, Hypertension Denies history of Angina, Arrhythmia, Deep Vein Thrombosis, Hypotension, Myocardial Infarction, Peripheral Arterial Disease, Peripheral Venous Disease, Phlebitis, Vasculitis Gastrointestinal Denies history of Cirrhosis , Colitis, Crohn s, Hepatitis A, Hepatitis B, Hepatitis C Endocrine Patient has history of Type II Diabetes Denies history of Type I Diabetes Genitourinary  Denies history of End Stage Renal Disease Immunological Denies history of Lupus Erythematosus, Raynaud s, Scleroderma Integumentary (Skin) Denies history of History of Burn, History of pressure wounds Musculoskeletal Patient has history of Osteoarthritis Denies history of Gout, Rheumatoid Arthritis, Osteomyelitis Neurologic Patient has history of Neuropathy Denies history of Dementia, Quadriplegia, Paraplegia, Seizure Disorder Oncologic Patient has history of Received Chemotherapy - last 2016, Received Radiation -  2 years ago Psychiatric Denies history of Anorexia/bulimia, Confinement Anxiety Patient is treated with Oral Agents. Blood sugar is tested. Blood sugar results noted at the following times: Breakfast - 121. 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 Denies complaints or symptoms of Dry Eyes, Vision Changes. Ear/Nose/Mouth/Throat Denies complaints or symptoms of Difficult clearing ears, Sinusitis. Hematologic/Lymphatic Denies complaints or symptoms of Bleeding / Clotting Disorders, Human Immunodeficiency Virus. Respiratory Denies complaints or symptoms of Chronic or frequent coughs, Shortness of Breath. Cardiovascular Denies complaints or symptoms of Chest pain, LE edema. Gastrointestinal Denies complaints or symptoms of Frequent diarrhea, Nausea, Vomiting. Endocrine Denies complaints or symptoms of Hepatitis, Thyroid disease, Polydypsia (Excessive Thirst). Genitourinary Ogletree, Ticia A. ([id number]) Denies complaints or symptoms of Kidney failure/ Dialysis, Incontinence/dribbling. Immunological Denies complaints or symptoms of Hives, Itching. Integumentary (Skin) Denies complaints or symptoms of Wounds, Bleeding or bruising tendency, Breakdown, Swelling. Musculoskeletal Denies complaints or symptoms of Muscle Pain, Muscle Weakness. Neurologic Denies complaints or symptoms of Numbness/parasthesias, Focal/Weakness. Psychiatric Denies complaints or symptoms of Anxiety, Claustrophobia. Objective Constitutional Sitting or standing Blood Pressure is within target range for patient.. Pulse regular and within target range for patient.Marland Kitchen Respirations regular, non-labored and within target range.. Temperature is normal and within the target range for the patient.Marland Kitchen appears in no distress. Vitals Time Taken: 3:12 PM, Temperature: 97.4 F, Pulse: 52 bpm,  Respiratory Rate: 16 breaths/min, Blood Pressure: 126/40 mmHg. Eyes Conjunctivae clear. No discharge. Respiratory Respiratory effort is easy and symmetric bilaterally. Rate is normal at rest and on room air.. Cardiovascular Both popliteal pulses, dorsalis pedis and posterior tibial pulses are all palpable bilaterally although perhaps slightly reduced.. Lymphatic None palpable in the popliteal area bilaterally. Psychiatric No evidence of depression, anxiety, or agitation. Calm, cooperative, and communicative. Appropriate interactions and affect.. General Notes: Wound exam; the patient has mirror image problems on her bilateral lateral calcaneus above the plantar surface. On the right she has a denuded blister which is fairly sizable. On the left what looks to be a small firm deep tissue injury. There is no open area. Both of these areas are tender. There is no evidence of surrounding infection Integumentary (Hair, Skin) No primary skin issue is seen. Kartes, Latoiya A. 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 Follow-up Appointments: Return Appointment in 1 month Off-Loading: Turn and reposition every 2 hours - Keep pressure off of your lateral heels Other: - Protective boots to wear in the bed to elevate pressure. 1. She has bunny boots apparently to protect her heels at night. We have also asked her to float her heels on a pillow 2. I think she spends a lot of her time in a recliner and I have asked her to make sure that her feet extend beyond the edge of the recliner 3. She is wearing open heeled  shoes. These areas are not on a weightbearing surface while she is walking so this should suffice to relieve pressure in this area. Electronic Signature(s) Signed:  03/01/2018 6:18:32 PM By: [name] MD Entered By: [name] on 03/01/2018 15:58:37 Sandhu, [name] ([id number]) -------------------------------------------------------------------------------- ROS/PFSH Details Patient Name: Langstaff, Theora A. Date of Service: 03/01/2018 2:45 PM Medical Record Number: [id number] Patient Account Number: [id number] Date of Birth/Sex: 1940/07/24 (77 y.o. F) Treating RN: Montey Hora Primary Care Provider: Lavera [name] Other Clinician: Referring Provider: Referral, Self Treating Provider/Extender: Tito Dine in Treatment: 0 Information Obtained From Patient Wound History Do you currently have one or more open woundso No Have you tested positive for osteomyelitis (bone infection)o No Have you had any tests for circulation on your legso No Eyes Complaints and Symptoms: Positive for: Glasses / Contacts - glasses Negative for: Dry Eyes; Vision Changes Medical History: Positive for: Cataracts - removed Negative for: Glaucoma; Optic Neuritis Ear/Nose/Mouth/Throat Complaints and Symptoms: Negative for: Difficult clearing ears; Sinusitis Medical History: Negative for: Chronic sinus problems/congestion; Middle ear problems Hematologic/Lymphatic Complaints and Symptoms: Negative for: Bleeding / Clotting Disorders; Human Immunodeficiency Virus Medical History: Positive for: Anemia Negative for: Hemophilia; Human Immunodeficiency Virus; Lymphedema; Sickle Cell Disease Respiratory Complaints and Symptoms: Negative for: Chronic or frequent coughs; Shortness of Breath Medical History: Negative for: Aspiration; Asthma; Chronic Obstructive Pulmonary Disease (COPD); Pneumothorax; Sleep Apnea; Tuberculosis Cardiovascular Complaints and Symptoms: Negative for: Chest pain; LE edema Medical History: Positive for: Congestive Heart Failure; Coronary Artery Disease; Hypertension Contee, Sheika A. ([id number]) Negative for: Angina; Arrhythmia; Deep  Vein Thrombosis; Hypotension; Myocardial Infarction; Peripheral Arterial Disease; Peripheral Venous Disease; Phlebitis; Vasculitis Gastrointestinal Complaints and Symptoms: Negative for: Frequent diarrhea; Nausea; Vomiting Medical History: Negative for: Cirrhosis ; Colitis; Crohnos; Hepatitis A; Hepatitis B; Hepatitis C Endocrine Complaints and Symptoms: Negative for: Hepatitis; Thyroid disease; Polydypsia (Excessive Thirst) Medical History: Positive for: Type II Diabetes Negative for: Type I Diabetes Time with diabetes: at least 20 years Treated with: Oral agents Blood sugar tested every day: Yes Tested : QD Blood sugar testing results: Breakfast: 121 Genitourinary Complaints and Symptoms: Negative for: Kidney failure/ Dialysis; Incontinence/dribbling Medical History: Negative for: End Stage Renal Disease Immunological Complaints and Symptoms: Negative for: Hives; Itching Medical History: Negative for: Lupus Erythematosus; Raynaudos; Scleroderma Integumentary (Skin) Complaints and Symptoms: Negative for: Wounds; Bleeding or bruising tendency; Breakdown; Swelling Medical History: Negative for: History of Burn; History of pressure wounds Musculoskeletal Complaints and Symptoms: Negative for: Muscle Pain; Muscle Weakness Medical History: Positive for: Osteoarthritis Negative for: Gout; Rheumatoid Arthritis; Osteomyelitis Tierce, Oni A. 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 HBO  Extended History Items Eyes: Cataracts Immunizations Pneumococcal Vaccine: Received Pneumococcal Vaccination: No Tetanus Vaccine: Last tetanus shot: 03/12/2015 Immunization Notes: up to date Implantable Devices Family and Social History Cancer: Yes - Father; Diabetes: Yes - Mother; Heart Disease: Yes - Mother; Hereditary Spherocytosis: No; Hypertension: Yes - Mother; Kidney Disease: No; Lung Disease: No; Seizures: No; Stroke: No; Thyroid Problems: No; Tuberculosis: No; Never smoker; Marital Status - Divorced; Alcohol Use: Never; Drug Use: No History; Caffeine Use: Moderate; Financial Concerns: No; Food, Clothing or Shelter Needs: No; Support System Lacking: No; Transportation Concerns: No;  Advanced Directives: Yes (Not Provided); Patient does not want information on Advanced Directives; Living Will: Yes (Not Provided); Medical Power of Attorney: Yes - Deam Gillison - son (Not Provided) Electronic Signature(s) Signed: 03/01/2018 5:36:16 PM By: Montey Hora Signed: 03/01/2018 6:18:32 PM By: [name] MD Entered By: Montey Hora on 03/01/2018 15:18:18 Leija, Tzipora A. ([id number]) -------------------------------------------------------------------------------- [facility] Details Patient Name: Angulo, Xaria A. Date of Service: 03/01/2018 Medical Record Number: [id number] Patient Account Number: [id number] Date of Birth/Sex: 1940/01/23 (77 y.o. 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

## 2018-03-02 NOTE — Progress Notes (Signed)
[name], [name] ([id number]) Visit Report for 03/01/2018 Allergy List Details Patient Name: [name], [name] Date of Service: 03/01/2018 2:45 PM Medical Record Number: [id number] Patient Account Number: [id number] Date of Birth/Sex: 1940-05-14 (77 y.o. F) Treating RN: Montey Hora Primary Care [name]: [name] Other Clinician: Referring [name]: Referral, Self Treating [name]/Extender: [name] in Treatment: 0 Allergies Active Allergies Aricept Benzodiazepines Opioids - Morphine Analogues penicillin Sulfa (Sulfonamide Antibiotics) Statins-Hmg-Coa Reductase Inhibitors Allergy Notes Electronic Signature(s) Signed: 03/01/2018 5:36:16 PM By: Montey Hora Entered By: Montey Hora on 03/01/2018 15:15:52 [name], [name] ([id number]) -------------------------------------------------------------------------------- Arrival Information Details Patient Name: [name], [name] Date of Service: 03/01/2018 2:45 PM Medical Record Number: [id number] Patient Account Number: [id number] Date of Birth/Sex: [birth year] (77 y.o. F) Treating RN: Montey Hora Primary Care [name]: [name] Other Clinician: Referring Annelle Behrendt: Referral, Self Treating [name]/Extender: [name] in Treatment: 0 Visit Information Patient Arrived: Walker Arrival Time: 15:10 Accompanied By: self Transfer Assistance: None Patient Identification Verified: Yes Secondary Verification Process Completed: Yes Patient Has Alerts: Yes Patient Alerts: DMII aspirin 81 History Since Last Visit Added or deleted any medications: No Any new allergies or adverse reactions: No Had a fall or experienced change in activities of daily living that may affect risk of falls: No Signs or symptoms of abuse/neglect since last visito No Hospitalized since last visit: No Implantable device outside of the clinic excluding cellular tissue based products placed in the center since last visit: No Electronic  Signature(s) Signed: 03/01/2018 5:36:16 PM By: Montey Hora Entered By: Montey Hora on 03/01/2018 15:21:24 [name], [name] ([id number]) -------------------------------------------------------------------------------- Clinic Level of Care Assessment Details Patient Name: [name], [name] Date of Service: 03/01/2018 2:45 PM Medical Record Number: [id number] Patient Account Number: [id number] Date of Birth/Sex: 1940-07-05 (77 y.o. F) Treating RN: [name] Primary Care Broxton Broady: [name] Other Clinician: Referring [name]: Referral, Self Treating [name]/Extender: [name] in Treatment: 0 Clinic Level of Care Assessment Items TOOL 2 Quantity Score []  - Use when only an EandM is performed on the INITIAL visit 0 ASSESSMENTS - Nursing Assessment / Reassessment X - General Physical Exam (combine w/ comprehensive assessment (listed just below) when 1 20 performed on new pt. evals) X- 1 25 Comprehensive Assessment (HX, ROS, Risk Assessments, Wounds Hx, etc.) ASSESSMENTS - Wound and Skin Assessment / Reassessment X - Simple Wound Assessment / Reassessment - one wound 1 5 []  - 0 Complex Wound Assessment / Reassessment - multiple wounds []  - 0 Dermatologic / Skin Assessment (not related to wound area) ASSESSMENTS - Ostomy and/or Continence Assessment and Care []  - Incontinence Assessment and Management 0 []  - 0 Ostomy Care Assessment and Management (repouching, etc.) PROCESS - Coordination of Care X - Simple Patient / Family Education for ongoing care 1 15 []  - 0 Complex (extensive) Patient / Family Education for ongoing care []  - 0 Staff obtains Programmer, systems, Records, Test Results / Process Orders []  - 0 Staff telephones HHA, Nursing Homes / Clarify orders / etc []  - 0 Routine Transfer to another Facility (non-emergent condition) []  - 0 Routine Hospital Admission (non-emergent condition) []  - 0 New Admissions / Biomedical engineer / Ordering NPWT, Apligraf,  etc. []  - 0 Emergency Hospital Admission (emergent condition) X- 1 10 Simple Discharge Coordination []  - 0 Complex (extensive) Discharge Coordination PROCESS - Special Needs []  - Pediatric / Minor Patient Management 0 []  - 0 Isolation Patient Management [name], [name] ([id number]) []  - 0 Hearing / Language / Visual special needs []  -  0 Assessment of Community assistance (transportation, D/C planning, etc.) []  - 0 Additional assistance / Altered mentation []  - 0 Support Surface(s) Assessment (bed, cushion, seat, etc.) INTERVENTIONS - Wound Cleansing / Measurement X - Wound Imaging (photographs - any number of wounds) 1 5 []  - 0 Wound Tracing (instead of photographs) []  - 0 Simple Wound Measurement - one wound []  - 0 Complex Wound Measurement - multiple wounds []  - 0 Simple Wound Cleansing - one wound []  - 0 Complex Wound Cleansing - multiple wounds INTERVENTIONS - Wound Dressings []  - Small Wound Dressing one or multiple wounds 0 X- 1 15 Medium Wound Dressing one or multiple wounds []  - 0 Large Wound Dressing one or multiple wounds []  - 0 Application of Medications - injection INTERVENTIONS - Miscellaneous []  - External ear exam 0 []  - 0 Specimen Collection (cultures, biopsies, blood, body fluids, etc.) []  - 0 Specimen(s) / Culture(s) sent or taken to Lab for analysis []  - 0 Patient Transfer (multiple staff / Civil Service fast streamer / Similar devices) []  - 0 Simple Staple / Suture removal (25 or less) []  - 0 Complex Staple / Suture removal (26 or more) []  - 0 Hypo / Hyperglycemic Management (close monitor of Blood Glucose) X- 1 15 Ankle / Brachial Index (ABI) - do not check if billed separately Has the patient been seen at the hospital within the last three years: Yes Total Score: 110 Level Of Care: New/Established - Level 3 Electronic Signature(s) Signed: 03/01/2018 6:42:35 PM By: Gretta Cool, BSN, RN, CWS, Kim RN, BSN Entered By: Gretta Cool, BSN, RN, CWS, Kim on 03/01/2018  15:48:40 [name], [name] ([id number]) -------------------------------------------------------------------------------- Encounter Discharge Information Details Patient Name: [name], [name] Date of Service: 03/01/2018 2:45 PM Medical Record Number: [id number] Patient Account Number: [id number] Date of Birth/Sex: 1940-04-10 (77 y.o. F) Treating RN: [name] Primary Care Brightyn Mozer: [name] Other Clinician: Referring Jonovan Boedecker: Referral, Self Treating Niels Cranshaw/Extender: [name] in Treatment: 0 Encounter Discharge Information Items Discharge Condition: Stable Ambulatory Status: Walker Discharge Destination: Home Transportation: Private Auto Accompanied By: self Schedule Follow-up Appointment: Yes Clinical Summary of Care: Electronic Signature(s) Signed: 03/01/2018 5:49:44 PM By: Gretta Cool, BSN, RN, CWS, Kim RN, BSN Entered By: Gretta Cool, BSN, RN, CWS, Kim on 03/01/2018 17:49:44 Mehlman, [name] ([id number]) -------------------------------------------------------------------------------- Lower Extremity Assessment Details Patient Name: [name], [name] Date of Service: 03/01/2018 2:45 PM Medical Record Number: [id number] Patient Account Number: [id number] Date of Birth/Sex: 07-16-1940 (77 y.o. F) Treating RN: Montey Hora Primary Care Kima Malenfant: [name] Other Clinician: Referring Harjas Biggins: Referral, Self Treating Jontay Maston/Extender: [name] in Treatment: 0 Edema Assessment Assessed: [Left: No] [Right: No] Edema: [Left: No] [Right: No] Vascular Assessment Pulses: Dorsalis Pedis Palpable: [Left:Yes] [Right:Yes] Doppler Audible: [Left:Yes] [Right:Yes] Posterior Tibial Palpable: [Left:No] [Right:No] Doppler Audible: [Left:Yes] [Right:Yes] Extremity colors, hair growth, and conditions: Extremity Color: [Left:Normal] [Right:Normal] Hair Growth on Extremity: [Left:Yes] [Right:Yes] Temperature of Extremity: [Left:Cool] [Right:Cool] Capillary Refill: [Left:< 3  seconds] [Right:< 3 seconds] Toe Nail Assessment Left: Right: Thick: Yes Yes Discolored: No No Deformed: No No Improper Length and Hygiene: Yes Yes Notes ABI [place] BILATERAL Electronic Signature(s) Signed: 03/01/2018 5:36:16 PM By: Montey Hora Entered By: Montey Hora on 03/01/2018 15:32:08 Daino, Simara A. ([id number]) -------------------------------------------------------------------------------- Multi Wound Chart Details Patient Name: Vanzile, Demika A. Date of Service: 03/01/2018 2:45 PM Medical Record Number: [id number] Patient Account Number: [id number] Date of Birth/Sex: 02-02-1940 (77 y.o. F) Treating RN: [name] Primary Care Rey Fors: [name] Other Clinician: Referring Benigno Check: Referral, Self Treating Kimmarie Pascale/Extender: Dellia Nims  MICHAEL G [name] in Treatment: 0 Vital Signs Height(in): Pulse(bpm): 52 Weight(lbs): Blood Pressure(mmHg): 126/40 Body Mass Index(BMI): Temperature(F): 97.4 Respiratory Rate 16 (breaths/min): Wound Assessments Treatment Notes Electronic Signature(s) Signed: 03/01/2018 6:42:35 PM By: Gretta Cool, BSN, RN, CWS, Kim RN, BSN Entered By: Gretta Cool, BSN, RN, CWS, Kim on 03/01/2018 15:43:53 Mierzwa, [name] ([id number]) -------------------------------------------------------------------------------- Non-Wound Condition Assessment Details Patient Name: Lansky, Amorie A. Date of Service: 03/01/2018 2:45 PM Medical Record Number: [id number] Patient Account Number: [id number] Date of Birth/Sex: October 15, 1940 (77 y.o. F) Treating RN: Montey Hora Primary Care Anjolaoluwa Siguenza: [name] Other Clinician: Referring Keyontay Stolz: Referral, Self Treating Kellie Murrill/Extender: [name] in Treatment: 0 Non-Wound Condition: Condition: Suspected Deep Tissue Injury Location: Foot Side: Left Periwound Skin Texture Texture Color No Abnormalities Noted: No No Abnormalities Noted: No Moisture Temperature / Pain No Abnormalities Noted: No Tenderness on Palpation:  Yes Notes patient with suspected DTI on her left lateral heel - small purple area tender to the touch Electronic Signature(s) Signed: 03/01/2018 5:36:16 PM By: Montey Hora Entered By: Montey Hora on 03/01/2018 15:22:48 Sarria, Sonnia A. ([id number]) -------------------------------------------------------------------------------- Non-Wound Condition Assessment Details Patient Name: Timm, Dandria A. Date of Service: 03/01/2018 2:45 PM Medical Record Number: [id number] Patient Account Number: [id number] Date of Birth/Sex: 1940/02/14 (77 y.o. F) Treating RN: Montey Hora Primary Care Kassadi Presswood: [name] Other Clinician: Referring Sabas Frett: Referral, Self Treating Eydie Wormley/Extender: [name] in Treatment: 0 Non-Wound Condition: Condition: Other Dermatologic Condition Location: Foot Side: Right Periwound Skin Texture Texture Color No Abnormalities Noted: No No Abnormalities Noted: No Moisture Temperature / Pain No Abnormalities Noted: No Tenderness on Palpation: Yes Notes Patient with a blister on her right lateral heel Electronic Signature(s) Signed: 03/01/2018 5:36:16 PM By: Montey Hora Entered By: Montey Hora on 03/01/2018 15:24:24 Renninger, Michaelia A. ([id number]) -------------------------------------------------------------------------------- Pain Assessment Details Patient Name: Jocson, Joei A. Date of Service: 03/01/2018 2:45 PM Medical Record Number: [id number] Patient Account Number: [id number] Date of Birth/Sex: 11-26-1940 (77 y.o. F) Treating RN: Montey Hora Primary Care Mimie Goering: [name] Other Clinician: Referring Tyon Cerasoli: Referral, Self Treating Abner Ardis/Extender: [name] in Treatment: 0 Active Problems Location of Pain Severity and Description of Pain Patient Has Paino Yes Site Locations Character of Pain Describe the Pain: Aching Pain Management and Medication Current Pain Management: Electronic Signature(s) Signed:  03/01/2018 5:36:16 PM By: Montey Hora Entered By: Montey Hora on 03/01/2018 15:12:23 Bovenzi, Moncerrath AMarland Kitchen ([id number]) -------------------------------------------------------------------------------- Patient/Caregiver Education Details Patient Name: Gruber, Teaghan A. Date of Service: 03/01/2018 2:45 PM Medical Record Number: [id number] Patient Account Number: [id number] Date of Birth/Gender: Jun 19, 1940 (78 y.o. F) Treating RN: [name] Primary Care Physician: [name] Other Clinician: Referring Physician: Referral, Self Treating Physician/Extender: [name] in Treatment: 0 Education Assessment Education Provided To: Patient Education Topics Provided Pressure: Handouts: Pressure Ulcers: Care and Offloading Methods: Demonstration, Explain/Verbal Responses: State content correctly Electronic Signature(s) Signed: 03/01/2018 6:42:35 PM By: Gretta Cool, BSN, RN, CWS, Kim RN, BSN Entered By: Gretta Cool, BSN, RN, CWS, Kim on 03/01/2018 15:52:19 Dickerman, [name] ([id number]) -------------------------------------------------------------------------------- Vitals Details Patient Name: Huesca, Analiese A. Date of Service: 03/01/2018 2:45 PM Medical Record Number: [id number] Patient Account Number: [id number] Date of Birth/Sex: 1940-12-20 (77 y.o. F) Treating RN: Montey Hora Primary Care Walker Sitar: [name] Other Clinician: Referring Jaquanna Ballentine: Referral, Self Treating Adessa Primiano/Extender: [name] in Treatment: 0 Vital Signs Time Taken: 15:12 Temperature (F): 97.4 Pulse (bpm): 52 Respiratory Rate (breaths/min): 16 Blood Pressure (mmHg): 126/40 Reference Range: 80 - 120 mg / dl Airway Electronic Signature(s) Signed:  03/01/2018 5:36:16 PM By: Montey Hora Entered By: Montey Hora on 03/01/2018 15:15:41

## 2018-03-02 NOTE — Progress Notes (Signed)
YUNIQUE, DEARCOS ([id number]) Visit Report for 03/01/2018 Abuse/Suicide Risk Screen Details Patient Name: [name], [name] Date of Service: 03/01/2018 2:45 PM Medical Record Number: [id number] Patient Account Number: [id number] Date of Birth/Sex: August 24, 1940 (77 y.o. F) Treating RN: Montey Hora Primary Care Oakley Orban: Lavera [name] Other Clinician: Referring Aminat Shelburne: Referral, Self Treating Dayyan Krist/Extender: Tito Dine in Treatment: 0 Abuse/Suicide Risk Screen Items Answer ABUSE/SUICIDE RISK SCREEN: Has anyone close to you tried to hurt or harm you recentlyo No Do you feel uncomfortable with anyone in your familyo No Has anyone forced you do things that you didnot want to doo No Do you have any thoughts of harming yourselfo No Patient displays signs or symptoms of abuse and/or neglect. No Electronic Signature(s) Signed: 03/01/2018 5:36:16 PM By: Montey Hora Entered By: Montey Hora on 03/01/2018 15:18:29 [name], [name] ([id number]) -------------------------------------------------------------------------------- Activities of Daily Living Details Patient Name: [name], [name] Date of Service: 03/01/2018 2:45 PM Medical Record Number: [id number] Patient Account Number: [id number] Date of Birth/Sex: 1940/02/17 (77 y.o. F) Treating RN: Montey Hora Primary Care Yisrael Obryan: Lavera [name] Other Clinician: Referring Kenyetta Wimbish: Referral, Self Treating Derric Dealmeida/Extender: Tito Dine in Treatment: 0 Activities of Daily Living Items Answer Activities of Daily Living (Please select one for each item) Drive Automobile Completely Able Take Medications Completely Able Use Telephone Completely Able Care for Appearance Completely Able Use Toilet Completely Able Bath / Shower Completely Able Dress Self Completely Able Feed Self Completely Able Walk Completely Able Get In / Out Bed Completely Able Housework Completely Able Prepare Meals Completely Able Handle Money  Completely Able Shop for Self Completely Able Electronic Signature(s) Signed: 03/01/2018 5:36:16 PM By: Montey Hora Entered By: Montey Hora on 03/01/2018 15:18:49 [name], [name] ([id number]) -------------------------------------------------------------------------------- Education Assessment Details Patient Name: [name], [name] Date of Service: 03/01/2018 2:45 PM Medical Record Number: [id number] Patient Account Number: [id number] Date of Birth/Sex: [birth year] (77 y.o. F) Treating RN: Montey Hora Primary Care Barrie Sigmund: Lavera [name] Other Clinician: Referring Sani Loiseau: Referral, Self Treating Ezrah Dembeck/Extender: Tito Dine in Treatment: 0 Primary Learner Assessed: Patient Learning Preferences/Education Level/Primary Language Learning Preference: Explanation, Demonstration Highest Education Level: High School Preferred Language: English Cognitive Barrier Assessment/Beliefs Language Barrier: No Translator Needed: No Memory Deficit: No Emotional Barrier: No Cultural/Religious Beliefs Affecting Medical Care: No Physical Barrier Assessment Impaired Vision: No Impaired Hearing: No Decreased Hand dexterity: No Knowledge/Comprehension Assessment Knowledge Level: Medium Comprehension Level: Medium Ability to understand written Medium instructions: Ability to understand verbal Medium instructions: Motivation Assessment Anxiety Level: Calm Cooperation: Cooperative Education Importance: Acknowledges Need Interest in Health Problems: Asks Questions Perception: Coherent Willingness to Engage in Self- Medium Management Activities: Readiness to Engage in Self- Medium Management Activities: Electronic Signature(s) Signed: 03/01/2018 5:36:16 PM By: Montey Hora Entered By: Montey Hora on 03/01/2018 15:19:24 [name], [name] ([id number]) -------------------------------------------------------------------------------- Fall Risk Assessment Details Patient Name:  [name], [name] Date of Service: 03/01/2018 2:45 PM Medical Record Number: [id number] Patient Account Number: [id number] Date of Birth/Sex: [birth year] (77 y.o. F) Treating RN: Montey Hora Primary Care Anahit Klumb: Lavera [name] Other Clinician: Referring Honora Searson: Referral, Self Treating Domonik Levario/Extender: Tito Dine in Treatment: 0 Fall Risk Assessment Items Have you had 2 or more falls in the last 12 monthso 0 No Have you had any fall that resulted in injury in the last 12 monthso 0 No FALL RISK ASSESSMENT: History of falling - immediate or within 3 months 0 No Secondary diagnosis 0 No Ambulatory aid None/bed rest/wheelchair/nurse 0 No Crutches/cane/walker 15 Yes Furniture 0  No IV Access/Saline Lock 0 No Gait/Training Normal/bed rest/immobile 0 No Weak 10 Yes Impaired 20 Yes Mental Status Oriented to own ability 0 Yes Electronic Signature(s) Signed: 03/01/2018 5:36:16 PM By: Montey Hora Entered By: Montey Hora on 03/01/2018 15:19:51 [name], [name] ([id number]) -------------------------------------------------------------------------------- Foot Assessment Details Patient Name: [name], [name] Date of Service: 03/01/2018 2:45 PM Medical Record Number: [id number] Patient Account Number: [id number] Date of Birth/Sex: 1940/12/09 (77 y.o. F) Treating RN: Montey Hora Primary Care Clayden Withem: Lavera [name] Other Clinician: Referring Darrius Montano: Referral, Self Treating Nuria Phebus/Extender: Tito Dine in Treatment: 0 Foot Assessment Items Site Locations + = Sensation present, - = Sensation absent, C = Callus, U = Ulcer R = Redness, W = Warmth, M = Maceration, PU = Pre-ulcerative lesion F = Fissure, S = Swelling, D = Dryness Assessment Right: Left: Other Deformity: No No Prior Foot Ulcer: No No Prior Amputation: No No Charcot Joint: No No Ambulatory Status: Ambulatory With Help Assistance Device: Walker Gait: Steady Electronic Signature(s) Signed:  03/01/2018 5:36:16 PM By: Montey Hora Entered By: Montey Hora on 03/01/2018 15:21:08 [name], [name] ([id number]) -------------------------------------------------------------------------------- Nutrition Risk Assessment Details Patient Name: [name], [name] Date of Service: 03/01/2018 2:45 PM Medical Record Number: [id number] Patient Account Number: [id number] Date of Birth/Sex: 1940/10/06 (77 y.o. F) Treating RN: Montey Hora Primary Care Faith Branan: Lavera [name] Other Clinician: Referring Tydus Sanmiguel: Referral, Self Treating Arrian Manson/Extender: Tito Dine in Treatment: 0 Height (in): Weight (lbs): Body Mass Index (BMI): Nutrition Risk Assessment Items NUTRITION RISK SCREEN: I have an illness or condition that made me change the kind and/or amount of 0 No food I eat I eat fewer than two meals per day 0 No I eat few fruits and vegetables, or milk products 0 No I have three or more drinks of beer, liquor or wine almost every day 0 No I have tooth or mouth problems that make it hard for me to eat 0 No I don't always have enough money to buy the food I need 0 No I eat alone most of the time 0 No I take three or more different prescribed or over-the-counter drugs a day 1 Yes Without wanting to, I have lost or gained 10 pounds in the last six months 0 No I am not always physically able to shop, cook and/or feed myself 0 No Nutrition Protocols Good Risk Protocol 0 No interventions needed Moderate Risk Protocol Electronic Signature(s) Signed: 03/01/2018 5:36:16 PM By: Montey Hora Entered By: Montey Hora on 03/01/2018 15:20:02

## 2018-03-15 ENCOUNTER — Encounter: Payer: Medicare Other | Attending: Internal Medicine | Admitting: Internal Medicine

## 2018-03-15 DIAGNOSIS — Z7902 Long term (current) use of antithrombotics/antiplatelets: Secondary | ICD-10-CM | POA: Diagnosis not present

## 2018-03-15 DIAGNOSIS — E1140 Type 2 diabetes mellitus with diabetic neuropathy, unspecified: Secondary | ICD-10-CM | POA: Diagnosis not present

## 2018-03-15 DIAGNOSIS — I252 Old myocardial infarction: Secondary | ICD-10-CM | POA: Insufficient documentation

## 2018-03-15 DIAGNOSIS — Z951 Presence of aortocoronary bypass graft: Secondary | ICD-10-CM | POA: Insufficient documentation

## 2018-03-15 DIAGNOSIS — L89626 Pressure-induced deep tissue damage of left heel: Secondary | ICD-10-CM | POA: Insufficient documentation

## 2018-03-15 DIAGNOSIS — Z7982 Long term (current) use of aspirin: Secondary | ICD-10-CM | POA: Diagnosis not present

## 2018-03-15 DIAGNOSIS — X58XXXA Exposure to other specified factors, initial encounter: Secondary | ICD-10-CM | POA: Diagnosis not present

## 2018-03-15 DIAGNOSIS — S51011A Laceration without foreign body of right elbow, initial encounter: Secondary | ICD-10-CM | POA: Insufficient documentation

## 2018-03-15 DIAGNOSIS — E11622 Type 2 diabetes mellitus with other skin ulcer: Secondary | ICD-10-CM | POA: Diagnosis present

## 2018-03-15 DIAGNOSIS — C9000 Multiple myeloma not having achieved remission: Secondary | ICD-10-CM | POA: Insufficient documentation

## 2018-03-17 NOTE — Progress Notes (Signed)
[name], [name] ([id number]) Visit Report for 03/15/2018 HPI Details Patient Name: Alabi, [name] Date of Service: 03/15/2018 10:00 AM Medical Record Number: [id number] Patient Account Number: [id number] Date of Birth/Sex: 1940-08-31 (78 y.o. F) Treating RN: Cornell Barman Primary Care Provider: Lavera [name] Other Clinician: Referring Provider: Lavera [name] Treating Provider/Extender: Tito Dine in Treatment: 2 History of Present Illness HPI Description: 12/24/15; [name] is a 78 year old woman who is the mother of one of our other clinic patient's [name] He comes [place] today for review a nonhealing surgical CABG wound. The patient underwent CABG o2 on October 31 17 at [facility]. She apparently had had a myocardial infarction in October but briefly presented with shortness of breath. She underwent surgery which I understand was uneventful. According to the patient her surgical incision never really healed in 2 spots. One is at the inferior recess question drain site, the other more superiorly about an inch from the superior surface of this it incision. She has been followed by her surgeon at [facility] who felt that both areas which he'll according to the patient. She was put on Augmentin 2 weeks ago and she is finished a course of this culture at that time showed Escherichia coli which should've been sensitive. The patient also relates that she has a history of allergy to ampicillin. Besides this the patient has a history of type 2 diabetes on metformin, multiple myeloma. She had a chest x-ray in mid October the did not show any abnormalities. I cannot see that she had any specific views of the sternum however. We have her referral note from her primary physician Dr. Bernie Covey. She was concerned about erythema around the wound and as mentioned the culture here showed Escherichia coli which should have been Augmentin sensitive but was resistant to ampicillin, Keflex  third generation cephalosporins quinolones and sulfonamides. [ESBL). The patient does not complain of fever or chills excessive pain. She does say that the lower surgical wound has been draining what looks to her like pus. She has been using triple antibiotic cream. 12/31/15 superior wound actually looks somewhat better. Inferiorly still a wound with some depth greater than 2 cm. Using Aquacel Ag. Culture I did of the drainage from the inferior wound and this surgical incision last week was negative 01/07/16; superior wound still requires debridement of surface eschar and nonviable subcutaneous tissue. Inferior wound measured at 1.8 cm. What I can see of the tissue here does not look very healthy. Original culture that I did on her presentation was negative however 01/27/16; the superior wound is closed she still has a deep probing wound of roughly 2 cm inferiorly. There is no overt infection no surrounding erythema. The patient tells me that 2 weeks ago she was developing chest pain and underwent a nuclear stress test. This was apparently negative however she developed chest pain shortly thereafter and had to go have a cardiac catheterization that did not show blockages. Since then she is not having chest pain but has unrelenting nausea making it difficult for her to eat. We reviewed her medications and she is on aspirin and Plavix however she apparently has a stent in place as well. 02/03/16; still using silver alginate. Depth of this is down from 1.8 cm the circumference is smaller and the depth down by 2 mm red no drainage and no surrounding infection 02/10/16; still using silver alginate depth down to 1.3 cm today measured by myself. Appears to be making slow progress. There  is no surrounding tenderness.. The patient states she has unrelenting nausea which is nonexertional. Not related to meals. She has had no vomiting. She also tells me she is had previous lap band surgery but does not give  clear reflux type symptoms 02/25/16; the wound orifice of this lady's wound on her chest is down to 1 cm in depth and much smaller in terms of the overall diameter. She tells me that she was packing the wound recently and developed quite a bit of bleeding that took several hours to stop she is on Plavix and aspirin. She is not having any exertional symptoms that sound like coronary artery disease. She is complaining of unrelenting nausea but she is being evaluated by GI for this and apparently has an endoscopy planned for March. We have been using iodoform packing 03/03/16; small wound in terms of orifice but measures 1.2 cm in depth, not much different from last time. She still complains of nausea and has an upper GI series books she is not describing exertional chest pain, shortness of breath, nausea or fatigue. 03/16/16; I follow this patient for a nonhealing surgical wound at a site of a previous CABG scar. She developed cellulitis in the incision postoperatively. Today she arrives with the wound orifice to miniscule to even consider measuring a depth. She came Wagenaar, Misheel A. ([id number]) in with a Band-Aid over this, she could not get any of the iodoform packing in the increasingly small orifice 03/31/16; the patient's nonhealing surgical wound at the site of her previous CABG scar/inferior aspect is totally epithelialized. She tells me there was some bleeding out of this which was minimal last week however with careful inspection there is no evidence of any open here everything looks healthy and epithelialize. Surrounding tissue also looks normal the patient arrives today with erythema and a significant blister mostly involving the dorsal aspect of her right fifth finger. There were 2 small almost looked like puncture wounds on the anterior aspect of the DIP joint and the medial aspect of the finger. The area was tender and warm. There was no involvement of any joint 04/20/16; The patient returns  today for one last followup. Her original chest wound has remained closed which is fortunate, She came in last time with a right 5th finger blister and erythema. she did not tolerate the aquacel AG. C+S I did was negative. since I last saw her she has a skin tear on the right lateral elbow while doing a medical test READMISSION 03/23/17;Mrs. Gertz is a now 78 year old woman that we cared for in this clinic about a year ago with nonhealing wounds at the site of her CABG. She has a type II diabetic with diabetic neuropathy. At the point of this dictation I don't have a hemoglobin A1c however she is only on oral agents. She tells me that she noted a blister on her left lateral heel about 3 weeks ago. She brought this to attention of her primary doctor and was given DuoDERM. Later on her daughter actually removed the surface of this and some surrounding skin. She is in for our review of this. She does not have a history of wounds on her lower extremities. She does not have known PAD. She does have neuropathy in her feet ABIs in this clinic were noncompressible bilaterally 03/30/17-she is here in follow up evaluation for a left heel wound. She is voicing no complaints or concerns. She states she could not wear the surgical shoe. She continues to have  maceration although not significantly different. We will switch to [facility] and she'll follow-up next week 04/06/17; patient is here for follow-up evaluation of the left heel wound. She has been using Prisma. The area is closed this week. READMISSION 03/01/2018. Patient is back in clinic today. She has type 2 diabetes with diabetic neuropathy. She states over the last week and a half she has noted painful areas on her bilateral outer heels. She has been in this clinic before in March 2019 almost a year ago with areas on her left heel. This healed out very easily. ABIs in our clinic have been consistently noncompressible although she is not known to have a  significant arterial issue. She is gone to wearing her open heeled bedroom slippers so as not to put any pressure on the heel although a week or so ago she was walking in shoes that may have contributed to the areas in question. 03/15/2018; 3-week follow-up. The right heel looks fine. The left heel deep tissue injury looks improved. Surface epithelium is intact. There is still evidence of subcutaneous damage but this is not evolved into a wound so far Electronic Signature(s) Signed: 03/15/2018 5:05:37 PM By: [name] MD Entered By: [name] on 03/15/2018 10:38:58 Falcon, Mekesha A. ([id number]) -------------------------------------------------------------------------------- Physical Exam Details Patient Name: Limb, Kebra A. Date of Service: 03/15/2018 10:00 AM Medical Record Number: [id number] Patient Account Number: [id number] Date of Birth/Sex: 1940/03/30 (78 y.o. F) Treating RN: Cornell Barman Primary Care Provider: Lavera [name] Other Clinician: Referring Provider: Lavera [name] Treating Provider/Extender: Ricard Dillon Weeks in Treatment: 2 Constitutional Sitting or standing Blood Pressure is within target range for patient.. Pulse regular and within target range for patient.Marland Kitchen Respirations regular, non-labored and within target range.. Temperature is normal and within the target range for the patient.Marland Kitchen appears in no distress. Notes Wound exam; the patient's right heel has no open wound. There is still the remanence of her deep tissue injury on the left lateral heel however this is not opened and I think will over time resolve as long as there is no additional pressure placed on this area. Electronic Signature(s) Signed: 03/15/2018 5:05:37 PM By: [name] MD Entered By: [name] on 03/15/2018 10:43:25 Hanahan, [name] ([id number]) -------------------------------------------------------------------------------- Physician Orders Details Patient Name: Lachman, Teylor A. Date  of Service: 03/15/2018 10:00 AM Medical Record Number: [id number] Patient Account Number: [id number] Date of Birth/Sex: 1940-03-30 (78 y.o. F) Treating RN: Cornell Barman Primary Care Provider: Lavera [name] Other Clinician: Referring Provider: Lavera [name] Treating Provider/Extender: Tito Dine in Treatment: 2 Verbal / Phone Orders: Yes Clinician: Cornell Barman Read Back and Verified: No Diagnosis Coding Wound Cleansing o May Shower, gently pat wound dry prior to applying new dressing. Follow-up Appointments o Return Appointment in 2 weeks. Off-Loading o Turn and reposition every 2 hours - Keep pressure off of your lateral heels o Other: - Protective boots to wear in the bed to elevate pressure. Electronic Signature(s) Signed: 03/15/2018 5:05:37 PM By: [name] MD Signed: 03/15/2018 5:29:19 PM By: Gretta Cool, BSN, RN, CWS, Kim RN, BSN Entered By: Gretta Cool, BSN, RN, CWS, Kim on 03/15/2018 10:24:46 Pinson, [name] ([id number]) -------------------------------------------------------------------------------- Problem List Details Patient Name: Cunning, Ricca A. Date of Service: 03/15/2018 10:00 AM Medical Record Number: [id number] Patient Account Number: [id number] Date of Birth/Sex: 02-11-1940 (78 y.o. 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  ([id number]) -------------------------------------------------------------------------------- Progress Note Details Patient Name: Mattos, Elida A. Date of Service: 03/15/2018 10:00 AM Medical Record Number: [id number] Patient Account Number: [id number] Date of Birth/Sex: 06-23-1940 (78 y.o. F) Treating RN: Cornell Barman Primary Care Provider: Lavera [name] Other Clinician: Referring Provider: Lavera [name] Treating Provider/Extender: Tito Dine in Treatment: 2 Subjective History of Present Illness (HPI) 12/24/15; Mrs. Hoffart is a 78 year old woman who is the mother of one of our other clinic patient's [name] Iman. He comes [place] today for review a nonhealing surgical CABG wound. The patient underwent CABG o2 on October 31 17 at [facility]. She apparently had had a myocardial infarction in October but briefly presented with shortness of breath. She underwent surgery which I understand was uneventful. According to the patient her surgical incision never really healed in 2 spots. One is at the inferior recess question drain site, the other more superiorly about an inch from the superior surface of this it incision. She has been followed by her surgeon at [facility] who felt that both areas which he'll according to the patient. She was put on Augmentin 2 weeks ago and she is finished a course of this culture at that time showed Escherichia coli which should've been sensitive. The patient also relates that she has a history of allergy to ampicillin. Besides this the patient has a history of type 2 diabetes on metformin, multiple myeloma. She had a chest x-ray in mid October the did not show any abnormalities. I cannot see that she had any specific views of the sternum however. We have her referral note from her primary physician Dr. Bernie Covey. She was concerned about erythema around the wound and as mentioned the culture here showed Escherichia coli which should have been Augmentin  sensitive but was resistant to ampicillin, Keflex third generation cephalosporins quinolones and sulfonamides. [ESBL). The patient does not complain of fever or chills excessive pain. She does say that the lower surgical wound has been draining what looks to her like pus. She has been using triple antibiotic cream. 12/31/15 superior wound actually looks somewhat better. Inferiorly still a wound with some depth greater than 2 cm. Using Aquacel Ag. Culture I did of the drainage from the inferior wound and this surgical incision last week was negative 01/07/16; superior wound still requires debridement of surface eschar and nonviable subcutaneous tissue. Inferior wound measured at 1.8 cm. What I can see of the tissue here does not look very healthy. Original culture that I did on her presentation was negative however 01/27/16; the superior wound is closed she still has a deep probing wound of roughly 2 cm inferiorly. There is no overt infection no surrounding erythema. The patient tells me that 2 weeks ago she was developing chest pain and underwent a nuclear stress test. This was apparently negative however she developed chest pain shortly thereafter and had to go have a cardiac catheterization that did not show blockages. Since then she is not having chest pain but has unrelenting nausea making it difficult for her to eat. We reviewed her medications and she is on aspirin and Plavix however she apparently has  a stent in place as well. 02/03/16; still using silver alginate. Depth of this is down from 1.8 cm the circumference is smaller and the depth down by 2 mm red no drainage and no surrounding infection 02/10/16; still using silver alginate depth down to 1.3 cm today measured by myself. Appears to be making slow progress. There is no surrounding tenderness.. The patient states she has unrelenting nausea which is nonexertional. Not related to meals. She has had no vomiting. She also tells me she is  had previous lap band surgery but does not give clear reflux type symptoms 02/25/16; the wound orifice of this lady's wound on her chest is down to 1 cm in depth and much smaller in terms of the overall diameter. She tells me that she was packing the wound recently and developed quite a bit of bleeding that took several hours to stop she is on Plavix and aspirin. She is not having any exertional symptoms that sound like coronary artery disease. She is complaining of unrelenting nausea but she is being evaluated by GI for this and apparently has an endoscopy planned for March. We have been using iodoform packing 03/03/16; small wound in terms of orifice but measures 1.2 cm in depth, not much different from last time. She still complains of nausea and has an upper GI series books she is not describing exertional chest pain, shortness of breath, nausea or fatigue. 03/16/16; I follow this patient for a nonhealing surgical wound at a site of a previous CABG scar. She developed cellulitis in the incision postoperatively. Today she arrives with the wound orifice to miniscule to even consider measuring a depth. She came in with a Band-Aid over this, she could not get any of the iodoform packing in the increasingly small orifice 03/31/16; the patient's nonhealing surgical wound at the site of her previous CABG scar/inferior aspect is totally epithelialized. She tells me there was some bleeding out of this which was minimal last week however with careful inspection there is no Baskin, Safaa A. ([id number]) evidence of any open here everything looks healthy and epithelialize. Surrounding tissue also looks normal the patient arrives today with erythema and a significant blister mostly involving the dorsal aspect of her right fifth finger. There were 2 small almost looked like puncture wounds on the anterior aspect of the DIP joint and the medial aspect of the finger. The area was tender and warm. There was no  involvement of any joint 04/20/16; The patient returns today for one last followup. Her original chest wound has remained closed which is fortunate, She came in last time with a right 5th finger blister and erythema. she did not tolerate the aquacel AG. C+S I did was negative. since I last saw her she has a skin tear on the right lateral elbow while doing a medical test READMISSION 03/23/17;Mrs. Tep is a now 78 year old woman that we cared for in this clinic about a year ago with nonhealing wounds at the site of her CABG. She has a type II diabetic with diabetic neuropathy. At the point of this dictation I don't have a hemoglobin A1c however she is only on oral agents. She tells me that she noted a blister on her left lateral heel about 3 weeks ago. She brought this to attention of her primary doctor and was given DuoDERM. Later on her daughter actually removed the surface of this and some surrounding skin. She is in for our review of this. She does not have a history  of wounds on her lower extremities. She does not have known PAD. She does have neuropathy in her feet ABIs in this clinic were noncompressible bilaterally 03/30/17-she is here in follow up evaluation for a left heel wound. She is voicing no complaints or concerns. She states she could not wear the surgical shoe. She continues to have maceration although not significantly different. We will switch to [facility] and she'll follow-up next week 04/06/17; patient is here for follow-up evaluation of the left heel wound. She has been using Prisma. The area is closed this week. READMISSION 03/01/2018. Patient is back in clinic today. She has type 2 diabetes with diabetic neuropathy. She states over the last week and a half she has noted painful areas on her bilateral outer heels. She has been in this clinic before in March 2019 almost a year ago with areas on her left heel. This healed out very easily. ABIs in our clinic have been consistently  noncompressible although she is not known to have a significant arterial issue. She is gone to wearing her open heeled bedroom slippers so as not to put any pressure on the heel although a week or so ago she was walking in shoes that may have contributed to the areas in question. 03/15/2018; 3-week follow-up. The right heel looks fine. The left heel deep tissue injury looks improved. Surface epithelium is intact. There is still evidence of subcutaneous damage but this is not evolved into a wound so far Objective Constitutional Sitting or standing Blood Pressure is within target range for patient.. Pulse regular and within target range for patient.Marland Kitchen Respirations regular, non-labored and within target range.. Temperature is normal and within the target range for the patient.Marland Kitchen appears in no distress. Vitals Time Taken: 10:07 AM, Height: 64 in, Source: Stated, Weight: 130 lbs, Source: Stated, BMI: 22.3, Temperature: 97.8  F, Pulse: 59 bpm, Respiratory Rate: 16 breaths/min, Blood Pressure: 114/43 mmHg. General Notes: Wound exam; the patient's right heel has no open wound. There is still the remanence of her deep tissue injury on the left lateral heel however this is not opened and I think will over time resolve as long as there is no additional pressure placed on this area. Lasch, Ashtyn A. ([id number]) Assessment Active Problems ICD-10 Pressure-induced deep tissue damage of left heel Plan Wound Cleansing: May Shower, gently pat wound dry prior to applying new dressing. Follow-up Appointments: Return Appointment in 2 weeks. Off-Loading: Turn and reposition every 2 hours - Keep pressure off of your lateral heels Other: - Protective boots to wear in the bed to elevate pressure. 1. As long as she keeps the pressure off this area this should fully resolve 2. We will check her again in a few weeks to ensure the area is still epithelialized, she can be discharged at that point. If there are issues  she will call us before then Electronic Signature(s) Signed: 03/15/2018 5:05:37 PM By: [name] MD Entered By: [name] on 03/15/2018 10:44:02 Stepanek, Sarajane A. ([id number]) -------------------------------------------------------------------------------- SuperBill Details Patient Name: Tullos, Tiah A. Date of Service: 03/15/2018 Medical Record Number: [id number] Patient Account Number: [id number] Date of Birth/Sex: Aug 18, 1940 (78 y.o. F) Treating RN: Cornell Barman Primary Care Provider: Lavera [name] Other Clinician: Referring Provider: Lavera [name] Treating Provider/Extender: Tito Dine in Treatment: 2 Diagnosis Coding ICD-10 Codes Code Description [phone] Pressure-induced deep tissue damage of left heel Facility Procedures CPT4 Code: [id number] Description: [phone] - WOUND CARE VISIT-LEV 2 EST PT Modifier: Quantity: 1 Physician Procedures CPT4 Code: [id number]  Description: 99212 - WC PHYS LEVEL 2 - EST PT ICD-10 Diagnosis Description L89.626 Pressure-induced deep tissue damage of left heel Modifier: Quantity: 1 Electronic Signature(s) Signed: 03/15/2018 5:05:37 PM By: [name] MD Entered By: [name] on 03/15/2018 11:57:59

## 2018-03-19 NOTE — Progress Notes (Signed)
KEELEE, YANKEY ([id number]) Visit Report for 03/15/2018 Arrival Information Details Patient Name: Coventry, Jakeira A. Date of Service: 03/15/2018 10:00 AM Medical Record Number: [id number] Patient Account Number: [id number] Date of Birth/Sex: 1940-06-25 (78 y.o. F) Treating RN: Harold Barban Primary Care Sim Choquette: Lavera [name] Other Clinician: Referring Sherlock Nancarrow: Lavera [name] Treating Eleshia Wooley/Extender: Tito Dine in Treatment: 2 Visit Information History Since Last Visit Added or deleted any medications: No Patient Arrived: [place] Any new allergies or adverse reactions: No Arrival Time: 10:02 Had a fall or experienced change in No Accompanied By: sef activities of daily living that may affect Transfer Assistance: Manual risk of falls: Patient Identification Verified: Yes Signs or symptoms of abuse/neglect since last visito No Secondary Verification Process Completed: Yes Hospitalized since last visit: No Patient Has Alerts: Yes Pain Present Now: Yes Patient Alerts: DMII aspirin 81 Electronic Signature(s) Signed: 03/17/2018 3:21:09 PM By: Harold Barban Entered By: Harold Barban on 03/15/2018 10:04:47 Weckerly, Ahria A. ([id number]) -------------------------------------------------------------------------------- Clinic Level of Care Assessment Details Patient Name: Sedler, Evamaria A. Date of Service: 03/15/2018 10:00 AM Medical Record Number: [id number] Patient Account Number: [id number] Date of Birth/Sex: [birth year] (78 y.o. F) Treating RN: Cornell Barman Primary Care Shunsuke Granzow: Lavera [name] Other Clinician: Referring Platon Arocho: Lavera [name] Treating Ewen Varnell/Extender: Tito Dine in Treatment: 2 Clinic Level of Care Assessment Items TOOL 4 Quantity Score []  - Use when only an EandM is performed on FOLLOW-UP visit 0 ASSESSMENTS - Nursing Assessment / Reassessment []  - Reassessment of Co-morbidities (includes updates in patient status) 0 X- 1 5 Reassessment of  Adherence to Treatment Plan ASSESSMENTS - Wound and Skin Assessment / Reassessment X - Simple Wound Assessment / Reassessment - one wound 1 5 []  - 0 Complex Wound Assessment / Reassessment - multiple wounds []  - 0 Dermatologic / Skin Assessment (not related to wound area) ASSESSMENTS - Focused Assessment []  - Circumferential Edema Measurements - multi extremities 0 []  - 0 Nutritional Assessment / Counseling / Intervention []  - 0 Lower Extremity Assessment (monofilament, tuning fork, pulses) []  - 0 Peripheral Arterial Disease Assessment (using hand held doppler) ASSESSMENTS - Ostomy and/or Continence Assessment and Care []  - Incontinence Assessment and Management 0 []  - 0 Ostomy Care Assessment and Management (repouching, etc.) PROCESS - Coordination of Care X - Simple Patient / Family Education for ongoing care 1 15 []  - 0 Complex (extensive) Patient / Family Education for ongoing care []  - 0 Staff obtains Programmer, systems, Records, Test Results / Process Orders []  - 0 Staff telephones HHA, Nursing Homes / Clarify orders / etc []  - 0 Routine Transfer to another Facility (non-emergent condition) []  - 0 Routine Hospital Admission (non-emergent condition) []  - 0 New Admissions / Biomedical engineer / Ordering NPWT, Apligraf, etc. []  - 0 Emergency Hospital Admission (emergent condition) X- 1 10 Simple Discharge Coordination Wyer, Yahaira A. ([id number]) []  - 0 Complex (extensive) Discharge Coordination PROCESS - Special Needs []  - Pediatric / Minor Patient Management 0 []  - 0 Isolation Patient Management []  - 0 Hearing / Language / Visual special needs []  - 0 Assessment of Community assistance (transportation, D/C planning, etc.) []  - 0 Additional assistance / Altered mentation []  - 0 Support Surface(s) Assessment (bed, cushion, seat, etc.) INTERVENTIONS - Wound Cleansing / Measurement X - Simple Wound Cleansing - one wound 1 5 []  - 0 Complex Wound Cleansing - multiple  wounds X- 1 5 Wound Imaging (photographs - any number of wounds) []  - 0 Wound Tracing (instead of photographs) X- 1 5 Simple  Wound Measurement - one wound []  - 0 Complex Wound Measurement - multiple wounds INTERVENTIONS - Wound Dressings []  - Small Wound Dressing one or multiple wounds 0 X- 1 15 Medium Wound Dressing one or multiple wounds []  - 0 Large Wound Dressing one or multiple wounds []  - 0 Application of Medications - topical []  - 0 Application of Medications - injection INTERVENTIONS - Miscellaneous []  - External ear exam 0 []  - 0 Specimen Collection (cultures, biopsies, blood, body fluids, etc.) []  - 0 Specimen(s) / Culture(s) sent or taken to Lab for analysis []  - 0 Patient Transfer (multiple staff / Civil Service fast streamer / Similar devices) []  - 0 Simple Staple / Suture removal (25 or less) []  - 0 Complex Staple / Suture removal (26 or more) []  - 0 Hypo / Hyperglycemic Management (close monitor of Blood Glucose) []  - 0 Ankle / Brachial Index (ABI) - do not check if billed separately X- 1 5 Vital Signs Corbello, Stephaine A. ([id number]) Has the patient been seen at the hospital within the last three years: Yes Total Score: 70 Level Of Care: New/Established - Level 2 Electronic Signature(s) Signed: 03/15/2018 5:29:19 PM By: Gretta Cool, BSN, RN, CWS, Kim RN, BSN Entered By: Gretta Cool, BSN, RN, CWS, Kim on 03/15/2018 10:25:09 Nathaniel, [name] ([id number]) -------------------------------------------------------------------------------- Encounter Discharge Information Details Patient Name: Haye, Zania A. Date of Service: 03/15/2018 10:00 AM Medical Record Number: [id number] Patient Account Number: [id number] Date of Birth/Sex: 28-Nov-1940 (78 y.o. F) Treating RN: Cornell Barman Primary Care Kelan Pritt: Lavera [name] Other Clinician: Referring Stephanine Reas: Lavera [name] Treating Gray Doering/Extender: Tito Dine in Treatment: 2 Encounter Discharge Information Items Discharge Condition:  Stable Ambulatory Status: Ambulatory Discharge Destination: Home Transportation: Private Auto Accompanied By: self Schedule Follow-up Appointment: Yes Clinical Summary of Care: Electronic Signature(s) Signed: 03/15/2018 5:29:19 PM By: Gretta Cool, BSN, RN, CWS, Kim RN, BSN Entered By: Gretta Cool, BSN, RN, CWS, Kim on 03/15/2018 10:29:28 Liz, Petina AMarland Kitchen ([id number]) -------------------------------------------------------------------------------- Lower Extremity Assessment Details Patient Name: Royse, Dianna A. Date of Service: 03/15/2018 10:00 AM Medical Record Number: [id number] Patient Account Number: [id number] Date of Birth/Sex: 08-Oct-1940 (78 y.o. F) Treating RN: Harold Barban Primary Care Linzie Criss: Lavera [name] Other Clinician: Referring Ahamed Hofland: Lavera [name] Treating Berlin Viereck/Extender: Tito Dine in Treatment: 2 Vascular Assessment Pulses: Dorsalis Pedis Palpable: [Left:Yes] [Right:Yes] Posterior Tibial Palpable: [Left:Yes] [Right:Yes] Extremity colors, hair growth, and conditions: Extremity Color: [Left:Normal] [Right:Normal] Hair Growth on Extremity: [Left:No] [Right:No] Temperature of Extremity: [Left:Cool] [Right:Cool] Capillary Refill: [Left:< 3 seconds] [Right:< 3 seconds] Toe Nail Assessment Left: Right: Thick: Yes Yes Discolored: Yes Yes Deformed: No No Improper Length and Hygiene: Yes Yes Electronic Signature(s) Signed: 03/17/2018 3:21:09 PM By: Harold Barban Entered By: Harold Barban on 03/15/2018 10:13:54 Seda, Threasa A. ([id number]) -------------------------------------------------------------------------------- Multi Wound Chart Details Patient Name: Scicchitano, Katalea A. Date of Service: 03/15/2018 10:00 AM Medical Record Number: [id number] Patient Account Number: [id number] Date of Birth/Sex: 04/10/1940 (78 y.o. F) Treating RN: Cornell Barman Primary Care Jenness Stemler: Lavera [name] Other Clinician: Referring Jacorey Donaway: Lavera [name] Treating Jacarius Handel/Extender: Tito Dine in Treatment: 2 Vital Signs Height(in): 64 Pulse(bpm): 59 Weight(lbs): 130 Blood Pressure(mmHg): 114/43 Body Mass Index(BMI): 22 Temperature(F): 97.8 Respiratory Rate 16 (breaths/min): Wound Assessments Treatment Notes Electronic Signature(s) Signed: 03/15/2018 5:29:19 PM By: Gretta Cool, BSN, RN, CWS, Kim RN, BSN Entered By: Gretta Cool, BSN, RN, CWS, Kim on 03/15/2018 10:23:57 [place], [name] ([id number]) -------------------------------------------------------------------------------- [place] Details Patient Name: Frisina, Lanee A. Date of Service: 03/15/2018 10:00 AM Medical Record Number: [id number] Patient Account Number:  [id number] Date of Birth/Sex: 27-Nov-1940 (78 y.o. F) Treating RN: Cornell Barman Primary Care Keandrea Tapley: Lavera [name] Other Clinician: Referring Alyna Stensland: Lavera [name] Treating Jaasia Viglione/Extender: Tito Dine in Treatment: 2 Active Inactive Pressure Nursing Diagnoses: Knowledge deficit related to management of pressures ulcers Goals: Patient will remain free from development of additional pressure ulcers Date Initiated: 03/15/2018 Target Resolution Date: 03/22/2018 Goal Status: Active Interventions: Provide education on pressure ulcers Notes: Electronic Signature(s) Signed: 03/15/2018 5:29:19 PM By: Gretta Cool, BSN, RN, CWS, Kim RN, BSN Entered By: Gretta Cool, BSN, RN, CWS, Kim on 03/15/2018 10:23:28 Ketcham, Jalana A. ([id number]) -------------------------------------------------------------------------------- Non-Wound Condition Assessment Details Patient Name: Munch, Chayce A. Date of Service: 03/15/2018 10:00 AM Medical Record Number: [id number] Patient Account Number: [id number] Date of Birth/Sex: 09-Dec-1940 (78 y.o. F) Treating RN: Harold Barban Primary Care Rida Loudin: Lavera [name] Other Clinician: Referring Marlissa Emerick: Lavera [name] Treating Eshaal Duby/Extender: Tito Dine in Treatment: 2 Non-Wound Condition: Condition:  Suspected Deep Tissue Injury Location: Foot Side: Left Photos Periwound Skin Texture Texture Color No Abnormalities Noted: No No Abnormalities Noted: No Moisture Temperature / Pain No Abnormalities Noted: No Tenderness on Palpation: Yes Dry / Scaly: Yes Notes More dry and discolored skin at this time Electronic Signature(s) Signed: 03/17/2018 3:21:09 PM By: Harold Barban Entered By: Harold Barban on 03/15/2018 10:12:23 Henningsen, Haleemah A. ([id number]) -------------------------------------------------------------------------------- Non-Wound Condition Assessment Details Patient Name: Walthers, Alvetta A. Date of Service: 03/15/2018 10:00 AM Medical Record Number: [id number] Patient Account Number: [id number] Date of Birth/Sex: 27-Aug-1940 (78 y.o. F) Treating RN: Harold Barban Primary Care Zaydin Billey: Lavera [name] Other Clinician: Referring Naftula Donahue: Lavera [name] Treating Brach Birdsall/Extender: Tito Dine in Treatment: 2 Non-Wound Condition: Condition: Other Dermatologic Condition Location: Foot Side: Right Photos Periwound Skin Texture Texture Color No Abnormalities Noted: No No Abnormalities Noted: No Moisture Temperature / Pain No Abnormalities Noted: No Tenderness on Palpation: Yes Dry / Scaly: Yes Notes Dry flaky skin at this time Electronic Signature(s) Signed: 03/17/2018 3:21:09 PM By: Harold Barban Entered By: Harold Barban on 03/15/2018 10:13:07 Whitsell, Miya A. ([id number]) -------------------------------------------------------------------------------- Pain Assessment Details Patient Name: Caisse, Keya A. Date of Service: 03/15/2018 10:00 AM Medical Record Number: [id number] Patient Account Number: [id number] Date of Birth/Sex: Feb 06, 1940 (78 y.o. F) Treating RN: Harold Barban Primary Care Gokul Waybright: Lavera [name] Other Clinician: Referring Terina Mcelhinny: Lavera [name] Treating Topacio Cella/Extender: Tito Dine in Treatment: 2 Active Problems Location of  Pain Severity and Description of Pain Patient Has Paino Yes Site Locations Pain Location: Generalized Pain Duration of the Pain. Constant / Intermittento Constant Character of Pain Describe the Pain: Aching, Burning Pain Management and Medication Current Pain Management: Notes Pain in Left calcaneous, Electronic Signature(s) Signed: 03/17/2018 3:21:09 PM By: Harold Barban Entered By: Harold Barban on 03/15/2018 10:06:38 Vangilder, Quetzally Loni Muse ([id number]) -------------------------------------------------------------------------------- Patient/Caregiver Education Details Patient Name: Reddy, Shenica A. Date of Service: 03/15/2018 10:00 AM Medical Record Number: [id number] Patient Account Number: [id number] Date of Birth/Gender: 1940/08/22 (78 y.o. F) Treating RN: Cornell Barman Primary Care Physician: Lavera [name] Other Clinician: Referring Physician: Lavera [name] Treating Physician/Extender: Tito Dine in Treatment: 2 Education Assessment Education Provided To: Patient Education Topics Provided Pressure: Handouts: Pressure Ulcers: Care and Offloading Methods: Demonstration, Explain/Verbal Responses: State content correctly Wound/Skin Impairment: Handouts: Caring for Your Ulcer Methods: Demonstration, Explain/Verbal Responses: State content correctly Electronic Signature(s) Signed: 03/15/2018 5:29:19 PM By: Gretta Cool, BSN, RN, CWS, Kim RN, BSN Entered By: Gretta Cool, BSN, RN, CWS, Kim on 03/15/2018 10:25:38 [place], [name] ([id number]) -------------------------------------------------------------------------------- Vitals Details Patient Name: Dix, Shirle A. Date  of Service: 03/15/2018 10:00 AM Medical Record Number: [id number] Patient Account Number: [id number] Date of Birth/Sex: 08/30/1940 (78 y.o. F) Treating RN: Harold Barban Primary Care Ronelle Michie: Lavera [name] Other Clinician: Referring Danyka Merlin: Lavera [name] Treating Cherelle Midkiff/Extender: Tito Dine in Treatment:  2 Vital Signs Time Taken: 10:07 Temperature (F): 97.8 Height (in): 64 Pulse (bpm): 59 Source: Stated Respiratory Rate (breaths/min): 16 Weight (lbs): 130 Blood Pressure (mmHg): 114/43 Source: Stated Reference Range: 80 - 120 mg / dl Body Mass Index (BMI): 22.3 Electronic Signature(s) Signed: 03/17/2018 3:21:09 PM By: Harold Barban Entered By: Harold Barban on 03/15/2018 10:10:20

## 2018-04-05 ENCOUNTER — Encounter: Payer: Medicare Other | Admitting: Internal Medicine

## 2018-04-05 ENCOUNTER — Other Ambulatory Visit: Payer: Self-pay

## 2018-04-05 DIAGNOSIS — L89626 Pressure-induced deep tissue damage of left heel: Secondary | ICD-10-CM | POA: Diagnosis not present

## 2018-04-06 NOTE — Progress Notes (Signed)
SHARESE, MANRIQUE ([id number]) Visit Report for 04/05/2018 Arrival Information Details Patient Name: [name], [name] Date of Service: 04/05/2018 10:00 AM Medical Record Number: [id number] Patient Account Number: [id number] Date of Birth/Sex: 02-02-1940 (78 y.o. F) Treating RN: Harold Barban Primary Care Charma Mocarski: Lavera [name] Other Clinician: Referring Durwood Dittus: Lavera [name] Treating Zachari Alberta/Extender: Tito Dine in Treatment: 5 Visit Information History Since Last Visit Added or deleted any medications: No Patient Arrived: Walker Any new allergies or adverse reactions: No Arrival Time: 10:12 Had a fall or experienced change in No Accompanied By: self activities of daily living that may affect Transfer Assistance: None risk of falls: Patient Identification Verified: Yes Signs or symptoms of abuse/neglect since last visito No Secondary Verification Process Completed: Yes Hospitalized since last visit: No Patient Has Alerts: Yes Pain Present Now: Yes Patient Alerts: DMII aspirin 81 Electronic Signature(s) Signed: 04/06/2018 10:08:57 AM By: Harold Barban Entered By: Harold Barban on 04/05/2018 10:12:40 [name], [name] ([id number]) -------------------------------------------------------------------------------- Clinic Level of Care Assessment Details Patient Name: [name], [name] Date of Service: 04/05/2018 10:00 AM Medical Record Number: [id number] Patient Account Number: [id number] Date of Birth/Sex: September 19, 1940 (78 y.o. F) Treating RN: Cornell Barman Primary Care Press Casale: Lavera [name] Other Clinician: Referring Livianna Petraglia: Lavera [name] Treating Alistar Mcenery/Extender: Tito Dine in Treatment: 5 Clinic Level of Care Assessment Items TOOL 4 Quantity Score []  - Use when only an EandM is performed on FOLLOW-UP visit 0 ASSESSMENTS - Nursing Assessment / Reassessment []  - Reassessment of Co-morbidities (includes updates in patient status) 0 X- 1 5 Reassessment of  Adherence to Treatment Plan ASSESSMENTS - Wound and Skin Assessment / Reassessment X - Simple Wound Assessment / Reassessment - one wound 1 5 []  - 0 Complex Wound Assessment / Reassessment - multiple wounds []  - 0 Dermatologic / Skin Assessment (not related to wound area) ASSESSMENTS - Focused Assessment []  - Circumferential Edema Measurements - multi extremities 0 []  - 0 Nutritional Assessment / Counseling / Intervention []  - 0 Lower Extremity Assessment (monofilament, tuning fork, pulses) []  - 0 Peripheral Arterial Disease Assessment (using hand held doppler) ASSESSMENTS - Ostomy and/or Continence Assessment and Care []  - Incontinence Assessment and Management 0 []  - 0 Ostomy Care Assessment and Management (repouching, etc.) PROCESS - Coordination of Care X - Simple Patient / Family Education for ongoing care 1 15 []  - 0 Complex (extensive) Patient / Family Education for ongoing care []  - 0 Staff obtains Programmer, systems, Records, Test Results / Process Orders []  - 0 Staff telephones HHA, Nursing Homes / Clarify orders / etc []  - 0 Routine Transfer to another Facility (non-emergent condition) []  - 0 Routine Hospital Admission (non-emergent condition) []  - 0 New Admissions / Biomedical engineer / Ordering NPWT, Apligraf, etc. []  - 0 Emergency Hospital Admission (emergent condition) X- 1 10 Simple Discharge Coordination [name], [name] ([id number]) []  - 0 Complex (extensive) Discharge Coordination PROCESS - Special Needs []  - Pediatric / Minor Patient Management 0 []  - 0 Isolation Patient Management []  - 0 Hearing / Language / Visual special needs []  - 0 Assessment of Community assistance (transportation, D/C planning, etc.) []  - 0 Additional assistance / Altered mentation []  - 0 Support Surface(s) Assessment (bed, cushion, seat, etc.) INTERVENTIONS - Wound Cleansing / Measurement X - Simple Wound Cleansing - one wound 1 5 []  - 0 Complex Wound Cleansing - multiple  wounds X- 1 5 Wound Imaging (photographs - any number of wounds) []  - 0 Wound Tracing (instead of photographs) X- 1 5 Simple  Wound Measurement - one wound []  - 0 Complex Wound Measurement - multiple wounds INTERVENTIONS - Wound Dressings []  - Small Wound Dressing one or multiple wounds 0 []  - 0 Medium Wound Dressing one or multiple wounds []  - 0 Large Wound Dressing one or multiple wounds []  - 0 Application of Medications - topical []  - 0 Application of Medications - injection INTERVENTIONS - Miscellaneous []  - External ear exam 0 []  - 0 Specimen Collection (cultures, biopsies, blood, body fluids, etc.) []  - 0 Specimen(s) / Culture(s) sent or taken to Lab for analysis []  - 0 Patient Transfer (multiple staff / Civil Service fast streamer / Similar devices) []  - 0 Simple Staple / Suture removal (25 or less) []  - 0 Complex Staple / Suture removal (26 or more) []  - 0 Hypo / Hyperglycemic Management (close monitor of Blood Glucose) []  - 0 Ankle / Brachial Index (ABI) - do not check if billed separately X- 1 5 Vital Signs [name], [name] ([id number]) Has the patient been seen at the hospital within the last three years: Yes Total Score: 55 Level Of Care: New/Established - Level 2 Electronic Signature(s) Signed: 04/06/2018 9:14:45 AM By: Gretta Cool, BSN, RN, CWS, Kim RN, BSN Entered By: Gretta Cool, BSN, RN, CWS, Kim on 04/05/2018 10:31:25 [name], [name] ([id number]) -------------------------------------------------------------------------------- Encounter Discharge Information Details Patient Name: [name], [name] Date of Service: 04/05/2018 10:00 AM Medical Record Number: [id number] Patient Account Number: [id number] Date of Birth/Sex: 01-12-1940 (78 y.o. F) Treating RN: Cornell Barman Primary Care Aaditya Letizia: Lavera [name] Other Clinician: Referring Krystyna Cleckley: Lavera [name] Treating Jamez Ambrocio/Extender: Tito Dine in Treatment: 5 Encounter Discharge Information Items Discharge Condition:  Stable Ambulatory Status: Walker Discharge Destination: Home Transportation: Private Auto Accompanied By: self Schedule Follow-up Appointment: Yes Clinical Summary of Care: Electronic Signature(s) Signed: 04/06/2018 9:14:45 AM By: Gretta Cool, BSN, RN, CWS, Kim RN, BSN Entered By: Gretta Cool, BSN, RN, CWS, Kim on 04/05/2018 10:32:35 [name], [name] ([id number]) -------------------------------------------------------------------------------- Lower Extremity Assessment Details Patient Name: [name], [name] Date of Service: 04/05/2018 10:00 AM Medical Record Number: [id number] Patient Account Number: [id number] Date of Birth/Sex: [birth year] (78 y.o. F) Treating RN: Harold Barban Primary Care Ikran Patman: Lavera [name] Other Clinician: Referring Malakhi Markwood: Lavera [name] Treating Orestes Geiman/Extender: Tito Dine in Treatment: 5 Vascular Assessment Pulses: Dorsalis Pedis Palpable: [Left:Yes] [Right:Yes] Posterior Tibial Palpable: [Left:Yes] [Right:Yes] Extremity colors, hair growth, and conditions: Extremity Color: [Left:Normal] [Right:Normal] Hair Growth on Extremity: [Left:Yes] [Right:Yes] Temperature of Extremity: [Left:Warm < 3 seconds] [Right:Warm < 3 seconds] Toe Nail Assessment Left: Right: Thick: No No Discolored: No No Deformed: No No Improper Length and Hygiene: No No Electronic Signature(s) Signed: 04/06/2018 10:08:57 AM By: Harold Barban Entered By: Harold Barban on 04/05/2018 10:22:13 [name], [name] ([id number]) -------------------------------------------------------------------------------- Multi Wound Chart Details Patient Name: [name], [name] Date of Service: 04/05/2018 10:00 AM Medical Record Number: [id number] Patient Account Number: [id number] Date of Birth/Sex: 10-11-1940 (78 y.o. F) Treating RN: Cornell Barman Primary Care Navneet Schmuck: Lavera [name] Other Clinician: Referring Ozzie Remmers: Lavera [name] Treating Yacine Droz/Extender: Tito Dine in Treatment:  5 Vital Signs Height(in): 64 Pulse(bpm): 68 Weight(lbs): 130 Blood Pressure(mmHg): 131/42 Body Mass Index(BMI): 22 Temperature(F): 98.2 Respiratory Rate 18 (breaths/min): Wound Assessments Treatment Notes Electronic Signature(s) Signed: 04/06/2018 9:14:45 AM By: Gretta Cool, BSN, RN, CWS, Kim RN, BSN Entered By: Gretta Cool, BSN, RN, CWS, Kim on 04/05/2018 10:29:45 [name], [name] ([id number]) -------------------------------------------------------------------------------- [place] Plan Details Patient Name: [name], [name] Date of Service: 04/05/2018 10:00 AM Medical Record Number: [id number] Patient Account Number: [id number] Date  of Birth/Sex: [birth year] (78 y.o. F) Treating RN: Cornell Barman Primary Care Jeronica Stlouis: Lavera [name] Other Clinician: Referring Jamelle Noy: Lavera [name] Treating Saydi Kobel/Extender: Tito Dine in Treatment: 5 Active Inactive Electronic Signature(s) Signed: 04/06/2018 9:14:45 AM By: Gretta Cool, BSN, RN, CWS, Kim RN, BSN Entered By: Gretta Cool, BSN, RN, CWS, Kim on 04/05/2018 10:29:37 [name], [name] ([id number]) -------------------------------------------------------------------------------- Non-Wound Condition Assessment Details Patient Name: [name], [name] Date of Service: 04/05/2018 10:00 AM Medical Record Number: [id number] Patient Account Number: [id number] Date of Birth/Gender: 28-Feb-1940 (78 y.o. F) Treating RN: Harold Barban Primary Care Physician: Lavera [name] Other Clinician: Referring Physician: Lavera [name] Treating Physician/Extender: Tito Dine in Treatment: 5 Non-Wound Condition: Condition: Suspected Deep Tissue Injury Location: Foot Side: Left Photos Periwound Skin Texture Texture Color No Abnormalities Noted: No No Abnormalities Noted: No Callus: Yes Temperature / Pain Moisture Tenderness on Palpation: Yes No Abnormalities Noted: No Dry / Scaly: Yes Electronic Signature(s) Signed: 04/06/2018 10:08:57 AM By:  Harold Barban Entered By: Harold Barban on 04/05/2018 10:21:35 [name], [name] ([id number]) -------------------------------------------------------------------------------- Non-Wound Condition Assessment Details Patient Name: Balz, Nikkol A. Date of Service: 04/05/2018 10:00 AM Medical Record Number: [id number] Patient Account Number: [id number] Date of Birth/Gender: [birth year] (78 y.o. F) Treating RN: Harold Barban Primary Care Physician: Lavera [name] Other Clinician: Referring Physician: Lavera [name] Treating Physician/Extender: Tito Dine in Treatment: 5 Non-Wound Condition: Condition: Other Dermatologic Condition Location: Foot Side: Right Photos Periwound Skin Texture Texture Color No Abnormalities Noted: No No Abnormalities Noted: No Moisture Temperature / Pain No Abnormalities Noted: No Tenderness on Palpation: Yes Dry / Scaly: Yes Electronic Signature(s) Signed: 04/06/2018 10:08:57 AM By: Harold Barban Entered By: Harold Barban on 04/05/2018 10:21:35 Lua, Maliea A. ([id number]) -------------------------------------------------------------------------------- Pain Assessment Details Patient Name: Douds, Suzzanne A. Date of Service: 04/05/2018 10:00 AM Medical Record Number: [id number] Patient Account Number: [id number] Date of Birth/Sex: 12-02-1940 (78 y.o. F) Treating RN: Harold Barban Primary Care Dejanay Wamboldt: Lavera [name] Other Clinician: Referring Laderius Valbuena: Lavera [name] Treating Damiano Stamper/Extender: Tito Dine in Treatment: 5 Active Problems Location of Pain Severity and Description of Pain Patient Has Paino Yes Site Locations Pain Location: Pain in Ulcers Rate the pain. Current Pain Level: 7 Pain Management and Medication Current Pain Management: Notes Patient describes pain as sore. Electronic Signature(s) Signed: 04/06/2018 10:08:57 AM By: Harold Barban Entered By: Harold Barban on 04/05/2018 10:15:25 Goodloe, Xara Loni Muse  ([id number]) -------------------------------------------------------------------------------- Patient/Caregiver Education Details Patient Name: Raimer, Kailia A. Date of Service: 04/05/2018 10:00 AM Medical Record Number: [id number] Patient Account Number: [id number] Date of Birth/Gender: 30-Mar-1940 (78 y.o. F) Treating RN: Cornell Barman Primary Care Physician: Lavera [name] Other Clinician: Referring Physician: Lavera [name] Treating Physician/Extender: Tito Dine in Treatment: 5 Education Assessment Education Provided To: Patient Education Topics Provided Pressure: Handouts: Other: Continue to keep pressure off of heels Methods: Demonstration Responses: State content correctly Electronic Signature(s) Signed: 04/06/2018 9:14:45 AM By: Gretta Cool, BSN, RN, CWS, Kim RN, BSN Entered By: Gretta Cool, BSN, RN, CWS, Kim on 04/05/2018 10:32:11 Swader, [name] ([id number]) -------------------------------------------------------------------------------- Vitals Details Patient Name: Parmar, Destenie A. Date of Service: 04/05/2018 10:00 AM Medical Record Number: [id number] Patient Account Number: [id number] Date of Birth/Sex: 1940-05-01 (78 y.o. F) Treating RN: Harold Barban Primary Care Carleen Rhue: Lavera [name] Other Clinician: Referring Jeanell Mangan: Lavera [name] Treating Raesean Bartoletti/Extender: Tito Dine in Treatment: 5 Vital Signs Time Taken: 10:15 Temperature (F): 98.2 Height (in): 64 Pulse (bpm): 68 Weight (lbs): 130 Respiratory Rate (breaths/min): 18 Body Mass Index (BMI): 22.3 Blood Pressure (mmHg): 131/42  Reference Range: 80 - 120 mg / dl Electronic Signature(s) Signed: 04/06/2018 10:08:57 AM By: Harold Barban Entered By: Harold Barban on 04/05/2018 10:15:48

## 2018-04-06 NOTE — Progress Notes (Signed)
[name], [name] ([id number]) Visit Report for 04/05/2018 HPI Details Patient Name: [name], [name] Date of Service: 04/05/2018 10:00 AM Medical Record Number: [id number] Patient Account Number: [id number] Date of Birth/Sex: 1940-06-03 (78 y.o. F) Treating RN: Huel Coventry Primary Care Provider: Joen Laura Other Clinician: Referring Provider: Joen Laura Treating Provider/Extender: Altamese [place] in Treatment: 5 History of Present Illness HPI Description: 12/24/15; [name] is a 78 year old woman who is the mother of one of our other clinic patient's [name] He comes [place] today for review a nonhealing surgical CABG wound. The patient underwent CABG o2 on October 31 17 at [facility]. She apparently had had a myocardial infarction in October but briefly presented with shortness of breath. She underwent surgery which I understand was uneventful. According to the patient her surgical incision never really healed in 2 spots. One is at the inferior recess question drain site, the other more superiorly about an inch from the superior surface of this it incision. She has been followed by her surgeon at [facility] who felt that both areas which he'll according to the patient. She was put on Augmentin 2 weeks ago and she is finished a course of this culture at that time showed Escherichia coli which should've been sensitive. The patient also relates that she has a history of allergy to ampicillin. Besides this the patient has a history of type 2 diabetes on metformin, multiple myeloma. She had a chest x-ray in mid October the did not show any abnormalities. I cannot see that she had any specific views of the sternum however. We have her referral note from her primary physician [name] She was concerned about erythema around the wound and as mentioned the culture here showed Escherichia coli which should have been Augmentin sensitive but was resistant to ampicillin, Keflex  third generation cephalosporins quinolones and sulfonamides. [ESBL). The patient does not complain of fever or chills excessive pain. She does say that the lower surgical wound has been draining what looks to her like pus. She has been using triple antibiotic cream. 12/31/15 superior wound actually looks somewhat better. Inferiorly still a wound with some depth greater than 2 cm. Using Aquacel Ag. Culture I did of the drainage from the inferior wound and this surgical incision last week was negative 01/07/16; superior wound still requires debridement of surface eschar and nonviable subcutaneous tissue. Inferior wound measured at 1.8 cm. What I can see of the tissue here does not look very healthy. Original culture that I did on her presentation was negative however 01/27/16; the superior wound is closed she still has a deep probing wound of roughly 2 cm inferiorly. There is no overt infection no surrounding erythema. The patient tells me that 2 weeks ago she was developing chest pain and underwent a nuclear stress test. This was apparently negative however she developed chest pain shortly thereafter and had to go have a cardiac catheterization that did not show blockages. Since then she is not having chest pain but has unrelenting nausea making it difficult for her to eat. We reviewed her medications and she is on aspirin and Plavix however she apparently has a stent in place as well. 02/03/16; still using silver alginate. Depth of this is down from 1.8 cm the circumference is smaller and the depth down by 2 mm red no drainage and no surrounding infection 02/10/16; still using silver alginate depth down to 1.3 cm today measured by myself. Appears to be making slow progress. There  is no surrounding tenderness.. The patient states she has unrelenting nausea which is nonexertional. Not related to meals. She has had no vomiting. She also tells me she is had previous lap band surgery but does not give  clear reflux type symptoms 02/25/16; the wound orifice of this lady's wound on her chest is down to 1 cm in depth and much smaller in terms of the overall diameter. She tells me that she was packing the wound recently and developed quite a bit of bleeding that took several hours to stop she is on Plavix and aspirin. She is not having any exertional symptoms that sound like coronary artery disease. She is complaining of unrelenting nausea but she is being evaluated by GI for this and apparently has an endoscopy planned for March. We have been using iodoform packing 03/03/16; small wound in terms of orifice but measures 1.2 cm in depth, not much different from last time. She still complains of nausea and has an upper GI series books she is not describing exertional chest pain, shortness of breath, nausea or fatigue. 03/16/16; I follow this patient for a nonhealing surgical wound at a site of a previous CABG scar. She developed cellulitis in the incision postoperatively. Today she arrives with the wound orifice to miniscule to even consider measuring a depth. She came [name], [name] ([id number]) in with a Band-Aid over this, she could not get any of the iodoform packing in the increasingly small orifice 03/31/16; the patient's nonhealing surgical wound at the site of her previous CABG scar/inferior aspect is totally epithelialized. She tells me there was some bleeding out of this which was minimal last week however with careful inspection there is no evidence of any open here everything looks healthy and epithelialize. Surrounding tissue also looks normal the patient arrives today with erythema and a significant blister mostly involving the dorsal aspect of her right fifth finger. There were 2 small almost looked like puncture wounds on the anterior aspect of the DIP joint and the medial aspect of the finger. The area was tender and warm. There was no involvement of any joint 04/20/16; The patient returns  today for one last followup. Her original chest wound has remained closed which is fortunate, She came in last time with a right 5th finger blister and erythema. she did not tolerate the aquacel AG. C+S I did was negative. since I last saw her she has a skin tear on the right lateral elbow while doing a medical test READMISSION 03/23/17;Mrs. Lem is a now 78 year old woman that we cared for in this clinic about a year ago with nonhealing wounds at the site of her CABG. She has a type II diabetic with diabetic neuropathy. At the point of this dictation I don't have a hemoglobin A1c however she is only on oral agents. She tells me that she noted a blister on her left lateral heel about 3 weeks ago. She brought this to attention of her primary doctor and was given DuoDERM. Later on her daughter actually removed the surface of this and some surrounding skin. She is in for our review of this. She does not have a history of wounds on her lower extremities. She does not have known PAD. She does have neuropathy in her feet ABIs in this clinic were noncompressible bilaterally 03/30/17-she is here in follow up evaluation for a left heel wound. She is voicing no complaints or concerns. She states she could not wear the surgical shoe. She continues to have  maceration although not significantly different. We will switch to [facility] and she'll follow-up next week 04/06/17; patient is here for follow-up evaluation of the left heel wound. She has been using Prisma. The area is closed this week. READMISSION 03/01/2018. Patient is back in clinic today. She has type 2 diabetes with diabetic neuropathy. She states over the last week and a half she has noted painful areas on her bilateral outer heels. She has been in this clinic before in March 2019 almost a year ago with areas on her left heel. This healed out very easily. ABIs in our clinic have been consistently noncompressible although she is not known to have a  significant arterial issue. She is gone to wearing her open heeled bedroom slippers so as not to put any pressure on the heel although a week or so ago she was walking in shoes that may have contributed to the areas in question. 03/15/2018; 3-week follow-up. The right heel looks fine. The left heel deep tissue injury looks improved. Surface epithelium is intact. There is still evidence of subcutaneous damage but this is not evolved into a wound so far 3/25; 3-week follow-up. There is no open wound on either heel. She complains of pain in the right heel when she walks however surface epithelium is intact. Peripheral pulses are palpable Electronic Signature(s) Signed: 04/05/2018 5:18:51 PM By: Baltazar Najjar MD Entered By: Baltazar Najjar on 04/05/2018 10:35:26 Pressman, Tyna Jaksch ([id number]) -------------------------------------------------------------------------------- Physical Exam Details Patient Name: [name], [name] Date of Service: 04/05/2018 10:00 AM Medical Record Number: [id number] Patient Account Number: [id number] Date of Birth/Sex: January 05, 1941 (78 y.o. F) Treating RN: Huel Coventry Primary Care Provider: Joen Laura Other Clinician: Referring Provider: Joen Laura Treating Provider/Extender: Altamese [place] in Treatment: 5 Constitutional Sitting or standing Blood Pressure is within target range for patient.. Pulse regular and within target range for patient.Marland Kitchen Respirations regular, non-labored and within target range.. Temperature is normal and within the target range for the patient.Marland Kitchen appears in no distress. Notes Wound exam; the patient's right heel is no open wound she does not have a lot of subcutaneous fat in the area of the plantar heel but there is no open wound and I see no obvious reason for continued pain. oThe remanent of her left heel deep tissue injury had surface eschar but there is no open wound here either. Electronic Signature(s) Signed: 04/05/2018 5:18:51 PM By:  Baltazar Najjar MD Entered By: Baltazar Najjar on 04/05/2018 10:36:49 Foulks, Tyna Jaksch ([id number]) -------------------------------------------------------------------------------- Physician Orders Details Patient Name: Gurry, Kanya A. Date of Service: 04/05/2018 10:00 AM Medical Record Number: [id number] Patient Account Number: [id number] Date of Birth/Sex: [birth year] (78 y.o. F) Treating RN: Huel Coventry Primary Care Provider: Joen Laura Other Clinician: Referring Provider: Joen Laura Treating Provider/Extender: Altamese [place] in Treatment: 5 Verbal / Phone Orders: No Diagnosis Coding Discharge From [facility] Services o Discharge from Wound Care Center - treatment complete Electronic Signature(s) Signed: 04/05/2018 5:18:51 PM By: Baltazar Najjar MD Signed: 04/06/2018 9:14:45 AM By: Elliot Gurney, BSN, RN, CWS, Kim RN, BSN Entered By: Elliot Gurney, BSN, RN, CWS, Kim on 04/05/2018 10:30:19 Xiang, Tyna Jaksch ([id number]) -------------------------------------------------------------------------------- Problem List Details Patient Name: Reppucci, Elayne A. Date of Service: 04/05/2018 10:00 AM Medical Record Number: [id number] Patient Account Number: [id number] Date of Birth/Sex: [birth year] (78 y.o. 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  ([id number]) -------------------------------------------------------------------------------- Progress Note Details Patient Name: Covino, [name] Date of Service: 04/05/2018 10:00 AM Medical Record Number: [id number] Patient Account Number: [id number] Date of Birth/Sex: July 03, 1940 (78 y.o. F) Treating RN: Huel Coventry Primary Care Provider: Joen Laura Other Clinician: Referring Provider: Joen Laura Treating Provider/Extender: Altamese [place] in Treatment: 5 Subjective History of Present Illness (HPI) 12/24/15; Mrs. Garmany is a 78 year old woman who is the mother of one of our other clinic patient's [name] Emberson. He comes [place] today for review a nonhealing surgical CABG wound. The patient underwent CABG fo2 on October 31 17 at [facility]. She apparently had had a myocardial infarction in October but briefly presented with shortness of breath. She underwent surgery which I understand was uneventful. According to the patient her surgical incision never really healed in 2 spots. One is at the inferior recess question drain site, the other more superiorly about an inch from the superior surface of this it incision. She has been followed by her surgeon at [facility] who felt that both areas which he'll according to the patient. She was put on Augmentin 2 weeks ago and she is finished a course of this culture at that time showed Escherichia coli which should've been sensitive. The patient also relates that she has a history of allergy to ampicillin. Besides this the patient has a history of type 2 diabetes on metformin, multiple myeloma. She had a chest x-ray in mid October the did not show any abnormalities. I cannot see that she had any specific views of the sternum however. We have her referral note from her primary physician [name] She was concerned about erythema around the wound and as mentioned the culture here showed Escherichia coli which should have been Augmentin  sensitive but was resistant to ampicillin, Keflex third generation cephalosporins quinolones and sulfonamides. [ESBL). The patient does not complain of fever or chills excessive pain. She does say that the lower surgical wound has been draining what looks to her like pus. She has been using triple antibiotic cream. 12/31/15 superior wound actually looks somewhat better. Inferiorly still a wound with some depth greater than 2 cm. Using Aquacel Ag. Culture I did of the drainage from the inferior wound and this surgical incision last week was negative 01/07/16; superior wound still requires debridement of surface eschar and nonviable subcutaneous tissue. Inferior wound measured at 1.8 cm. What I can see of the tissue here does not look very healthy. Original culture that I did on her presentation was negative however 01/27/16; the superior wound is closed she still has a deep probing wound of roughly 2 cm inferiorly. There is no overt infection no surrounding erythema. The patient tells me that 2 weeks ago she was developing chest pain and underwent a nuclear stress test. This was apparently negative however she developed chest pain shortly thereafter and had to go have a cardiac catheterization that did not show blockages. Since then she is not having chest pain but has unrelenting nausea making it difficult for her to eat. We reviewed her medications and she is on aspirin and Plavix however she apparently has a stent in  place as well. 02/03/16; still using silver alginate. Depth of this is down from 1.8 cm the circumference is smaller and the depth down by 2 mm red no drainage and no surrounding infection 02/10/16; still using silver alginate depth down to 1.3 cm today measured by myself. Appears to be making slow progress. There is no surrounding tenderness.. The patient states she has unrelenting nausea which is nonexertional. Not related to meals. She has had no vomiting. She also tells me she is  had previous lap band surgery but does not give clear reflux type symptoms 02/25/16; the wound orifice of this lady's wound on her chest is down to 1 cm in depth and much smaller in terms of the overall diameter. She tells me that she was packing the wound recently and developed quite a bit of bleeding that took several hours to stop she is on Plavix and aspirin. She is not having any exertional symptoms that sound like coronary artery disease. She is complaining of unrelenting nausea but she is being evaluated by GI for this and apparently has an endoscopy planned for March. We have been using iodoform packing 03/03/16; small wound in terms of orifice but measures 1.2 cm in depth, not much different from last time. She still complains of nausea and has an upper GI series books she is not describing exertional chest pain, shortness of breath, nausea or fatigue. 03/16/16; I follow this patient for a nonhealing surgical wound at a site of a previous CABG scar. She developed cellulitis in the incision postoperatively. Today she arrives with the wound orifice to miniscule to even consider measuring a depth. She came in with a Band-Aid over this, she could not get any of the iodoform packing in the increasingly small orifice 03/31/16; the patient's nonhealing surgical wound at the site of her previous CABG scar/inferior aspect is totally epithelialized. She tells me there was some bleeding out of this which was minimal last week however with careful inspection there is no Rea, Tyannah A. ([id number]) evidence of any open here everything looks healthy and epithelialize. Surrounding tissue also looks normal the patient arrives today with erythema and a significant blister mostly involving the dorsal aspect of her right fifth finger. There were 2 small almost looked like puncture wounds on the anterior aspect of the DIP joint and the medial aspect of the finger. The area was tender and warm. There was no  involvement of any joint 04/20/16; The patient returns today for one last followup. Her original chest wound has remained closed which is fortunate, She came in last time with a right 5th finger blister and erythema. she did not tolerate the aquacel AG. C+S I did was negative. since I last saw her she has a skin tear on the right lateral elbow while doing a medical test READMISSION 03/23/17;Mrs. Vukelich is a now 78 year old woman that we cared for in this clinic about a year ago with nonhealing wounds at the site of her CABG. She has a type II diabetic with diabetic neuropathy. At the point of this dictation I don't have a hemoglobin A1c however she is only on oral agents. She tells me that she noted a blister on her left lateral heel about 3 weeks ago. She brought this to attention of her primary doctor and was given DuoDERM. Later on her daughter actually removed the surface of this and some surrounding skin. She is in for our review of this. She does not have a history of wounds on  her lower extremities. She does not have known PAD. She does have neuropathy in her feet ABIs in this clinic were noncompressible bilaterally 03/30/17-she is here in follow up evaluation for a left heel wound. She is voicing no complaints or concerns. She states she could not wear the surgical shoe. She continues to have maceration although not significantly different. We will switch to [facility] and she'll follow-up next week 04/06/17; patient is here for follow-up evaluation of the left heel wound. She has been using Prisma. The area is closed this week. READMISSION 03/01/2018. Patient is back in clinic today. She has type 2 diabetes with diabetic neuropathy. She states over the last week and a half she has noted painful areas on her bilateral outer heels. She has been in this clinic before in March 2019 almost a year ago with areas on her left heel. This healed out very easily. ABIs in our clinic have been consistently  noncompressible although she is not known to have a significant arterial issue. She is gone to wearing her open heeled bedroom slippers so as not to put any pressure on the heel although a week or so ago she was walking in shoes that may have contributed to the areas in question. 03/15/2018; 3-week follow-up. The right heel looks fine. The left heel deep tissue injury looks improved. Surface epithelium is intact. There is still evidence of subcutaneous damage but this is not evolved into a wound so far 3/25; 3-week follow-up. There is no open wound on either heel. She complains of pain in the right heel when she walks however surface epithelium is intact. Peripheral pulses are palpable Objective Constitutional Sitting or standing Blood Pressure is within target range for patient.. Pulse regular and within target range for patient.Marland Kitchen Respirations regular, non-labored and within target range.. Temperature is normal and within the target range for the patient.Marland Kitchen appears in no distress. Vitals Time Taken: 10:15 AM, Height: 64 in, Weight: 130 lbs, BMI: 22.3, Temperature: 98.2 F, Pulse: 68 bpm, Respiratory Rate: 18 breaths/min, Blood Pressure: 131/42 mmHg. General Notes: Wound exam; the patient's right heel is no open wound she does not have a lot of subcutaneous fat in the area of the plantar heel but there is no open wound and I see no obvious reason for continued pain. The remanent of her left Moll, Curtis A. ([id number]) heel deep tissue injury had surface eschar but there is no open wound here either. 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 I think the patient can be discharged. She has no open wound 2. I have advised her to see her primary doctor if she continues to have heel pain. This does not appear to be wound related. I do not know that this represents  claudication. This may be a foot soft tissue problem Electronic Signature(s) Signed: 04/05/2018 5:18:51 PM By: Baltazar Najjar MD Entered By: Baltazar Najjar on 04/05/2018 10:37:41 Ashbaugh, [place] A. ([id number]) -------------------------------------------------------------------------------- SuperBill Details Patient Name: Wohlers, Brad A. Date of Service: 04/05/2018 Medical Record Number: [id number] Patient Account Number: [id number] Date of Birth/Sex: September 16, 1940 (78 y.o. 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

## 2018-08-23 ENCOUNTER — Emergency Department: Payer: Medicare Other

## 2018-08-23 ENCOUNTER — Emergency Department
Admission: EM | Admit: 2018-08-23 | Discharge: 2018-08-23 | Disposition: A | Discharge status: Home / Self Care | Payer: Medicare Other | Attending: Student in an Organized Health Care Education/Training Program | Admitting: Student in an Organized Health Care Education/Training Program

## 2018-08-23 ENCOUNTER — Other Ambulatory Visit: Payer: Self-pay

## 2018-08-23 DIAGNOSIS — E119 Type 2 diabetes mellitus without complications: Secondary | ICD-10-CM | POA: Insufficient documentation

## 2018-08-23 DIAGNOSIS — R110 Nausea: Secondary | ICD-10-CM | POA: Diagnosis present

## 2018-08-23 DIAGNOSIS — Z79899 Other long term (current) drug therapy: Secondary | ICD-10-CM | POA: Diagnosis not present

## 2018-08-23 DIAGNOSIS — I252 Old myocardial infarction: Secondary | ICD-10-CM | POA: Diagnosis not present

## 2018-08-23 DIAGNOSIS — I25721 Atherosclerosis of autologous artery coronary artery bypass graft(s) with angina pectoris with documented spasm: Secondary | ICD-10-CM | POA: Insufficient documentation

## 2018-08-23 DIAGNOSIS — I10 Essential (primary) hypertension: Secondary | ICD-10-CM | POA: Diagnosis not present

## 2018-08-23 DIAGNOSIS — U071 COVID-19: Secondary | ICD-10-CM | POA: Diagnosis not present

## 2018-08-23 DIAGNOSIS — Z794 Long term (current) use of insulin: Secondary | ICD-10-CM | POA: Insufficient documentation

## 2018-08-23 DIAGNOSIS — E1151 Type 2 diabetes mellitus with diabetic peripheral angiopathy without gangrene: Secondary | ICD-10-CM | POA: Insufficient documentation

## 2018-08-23 DIAGNOSIS — Z7982 Long term (current) use of aspirin: Secondary | ICD-10-CM | POA: Insufficient documentation

## 2018-08-23 DIAGNOSIS — Z8673 Personal history of transient ischemic attack (TIA), and cerebral infarction without residual deficits: Secondary | ICD-10-CM | POA: Insufficient documentation

## 2018-08-23 DIAGNOSIS — Z8579 Personal history of other malignant neoplasms of lymphoid, hematopoietic and related tissues: Secondary | ICD-10-CM | POA: Insufficient documentation

## 2018-08-23 LAB — COMPREHENSIVE METABOLIC PANEL
ALT: 8 U/L (ref 0–44)
AST: 18 U/L (ref 15–41)
Albumin: 3.3 g/dL — ABNORMAL LOW (ref 3.5–5.0)
Alkaline Phosphatase: 73 U/L (ref 38–126)
Anion gap: 14 (ref 5–15)
BUN: 20 mg/dL (ref 8–23)
CO2: 22 mmol/L (ref 22–32)
Calcium: 9.2 mg/dL (ref 8.9–10.3)
Chloride: 97 mmol/L — ABNORMAL LOW (ref 98–111)
Creatinine, Ser: 1.38 mg/dL — ABNORMAL HIGH (ref 0.44–1.00)
GFR calc Af Amer: 43 mL/min — ABNORMAL LOW (ref >60)
GFR calc non Af Amer: 37 mL/min — ABNORMAL LOW (ref >60)
Glucose, Bld: 170 mg/dL — ABNORMAL HIGH (ref 70–99)
Potassium: 4.2 mmol/L (ref 3.5–5.1)
Sodium: 133 mmol/L — ABNORMAL LOW (ref 135–145)
Total Bilirubin: 0.7 mg/dL (ref 0.3–1.2)
Total Protein: 6.9 g/dL (ref 6.5–8.1)

## 2018-08-23 LAB — CBC WITH DIFFERENTIAL/PLATELET
Abs Immature Granulocytes: 0.03 10*3/uL (ref 0.00–0.07)
Basophils Absolute: 0 10*3/uL (ref 0.0–0.1)
Basophils Relative: 0 %
Eosinophils Absolute: 0 10*3/uL (ref 0.0–0.5)
Eosinophils Relative: 0 %
HCT: 34.6 % — ABNORMAL LOW (ref 36.0–46.0)
Hemoglobin: 10.8 g/dL — ABNORMAL LOW (ref 12.0–15.0)
Immature Granulocytes: 0 %
Lymphocytes Relative: 39 %
Lymphs Abs: 3.1 10*3/uL (ref 0.7–4.0)
MCH: 24.5 pg — ABNORMAL LOW (ref 26.0–34.0)
MCHC: 31.2 g/dL (ref 30.0–36.0)
MCV: 78.5 fL — ABNORMAL LOW (ref 80.0–100.0)
Monocytes Absolute: 0.3 10*3/uL (ref 0.1–1.0)
Monocytes Relative: 3 %
Neutro Abs: 4.5 10*3/uL (ref 1.7–7.7)
Neutrophils Relative %: 58 %
Platelets: 184 10*3/uL (ref 150–400)
RBC: 4.41 MIL/uL (ref 3.87–5.11)
RDW: 14.6 % (ref 11.5–15.5)
WBC: 8 10*3/uL (ref 4.0–10.5)
nRBC: 0 % (ref 0.0–0.2)

## 2018-08-23 LAB — TROPONIN I (HIGH SENSITIVITY)
Troponin I (High Sensitivity): 8 ng/L (ref <18)
Troponin I (High Sensitivity): 7 ng/L (ref <18)

## 2018-08-23 LAB — LIPASE, BLOOD: Lipase: 24 U/L (ref 11–51)

## 2018-08-23 MED ORDER — PROMETHAZINE HCL 25 MG/ML IJ SOLN
12.5000 mg | Freq: Four times a day (QID) | INTRAMUSCULAR | Status: DC | PRN
  Administered 2018-08-23: 09:00:00 12.5 mg via INTRAVENOUS
  Filled 2018-08-23: qty 1

## 2018-08-23 MED ORDER — ONDANSETRON 4 MG PO TBDP: 4.0000 mg | ORAL_TABLET | Freq: Three times a day (TID) | ORAL | 0 refills | Status: DC | PRN

## 2018-08-23 MED ORDER — FAMOTIDINE IN NACL 20-0.9 MG/50ML-% IV SOLN
20.0000 mg | Freq: Once | INTRAVENOUS | Status: AC
  Administered 2018-08-23: 09:00:00 20 mg via INTRAVENOUS
  Filled 2018-08-23: qty 50

## 2018-08-23 MED ORDER — ONDANSETRON HCL 4 MG/2ML IJ SOLN
4.0000 mg | Freq: Once | INTRAMUSCULAR | Status: AC
  Administered 2018-08-23: 4 mg via INTRAVENOUS
  Filled 2018-08-23: qty 2

## 2018-08-23 MED ORDER — FAMOTIDINE 20 MG PO TABS: 20.0000 mg | ORAL_TABLET | Freq: Every day | ORAL | 0 refills | Status: DC

## 2018-08-23 MED ORDER — SODIUM CHLORIDE 0.9 % IV BOLUS: 500.0000 mL | Freq: Once | INTRAVENOUS | Status: DC

## 2018-08-23 MED ORDER — SODIUM CHLORIDE 0.9 % IV BOLUS
250.0000 mL | Freq: Once | INTRAVENOUS | Status: AC
  Administered 2018-08-23: 08:00:00 250 mL via INTRAVENOUS

## 2018-08-23 NOTE — ED Notes (Signed)
X-ray at bedside

## 2018-08-23 NOTE — ED Notes (Signed)
Pt provided with warm blankets.

## 2018-08-23 NOTE — ED Notes (Signed)
Pt had hit call bell and asked RN to plug in phone charger. Pt has cell phone at bedside.

## 2018-08-23 NOTE — ED Triage Notes (Signed)
Pt arrives from home for nausea. Has not vomited. No diarrhea. No fevers. Nausea feeling began last night. Took zofran at home about an hour and a half ago. Was swabbed at [facility] on august 6th, positive result this past Saturday. Pt is A&O, no distress noted. Speaking in complete sentences.

## 2018-08-23 NOTE — ED Notes (Signed)
Pt provided ginger ale for PO challenge. OK per EDP.

## 2018-08-23 NOTE — ED Provider Notes (Signed)
[facility] Emergency Department Provider Note    First MD Initiated Contact with Patient 08/23/18 [phone]     (approximate)  I have reviewed the triage vital signs and the nursing notes.   HISTORY  Chief Complaint Nausea    HPI [name] is a 78 y.o. female below listed past medical history with recent diagnosis of COVID-19 presents the ER for evaluation of nausea that started this morning.  Denies any pain.  Has had some shortness of breath and cough.  Not been on any antibiotics.  She tried a Zofran this morning without any improvement.  She is worried that she is dehydrated.    Past Medical History:  Diagnosis Date   Anginal pain ([place])    Anxiety    Arthritis    "shoulders primarily" (01/13/2016)   CAD (coronary artery disease)    s/p CABG in Oct 2017   CHF (congestive heart failure) (HCC)    Chronic pain    from her multiple myeloma but says that her pain is usually in her legs/notes 01/13/2016   High cholesterol    History of blood transfusion    "numerous; related to procedures for my heart" (01/13/2016)   Hypertension    Ischemic cardiomyopathy    /nots 01/13/2016   MI (myocardial infarction) ([place])    EKG on arrival 01/13/2016 showed NSR with evidence of old anterior and inferior infarcts   MI (myocardial infarction) ([place]) 08/2015   Multiple myeloma ([place]) dx'd 2015   off chemo since 2016   Nausea and vomiting 05/10/2016   3-4 month hx of nausea and vomiting   PAD (peripheral artery disease) ([place])    Stroke ([place])    "several in 1 year; ? year" (01/13/2016)   Type II diabetes mellitus ([place])    Family History  Problem Relation Age of Onset   Diabetes Mother    Heart failure Mother    Prostate cancer Neg Hx    Kidney cancer Neg Hx    Bladder Cancer Neg Hx    Colon cancer Neg Hx    Stomach cancer Neg Hx    Past Surgical History:  Procedure Laterality Date   CARDIAC CATHETERIZATION N/A 01/14/2016   Procedure:  Left Heart Cath and Cors/Grafts Angiography;  Surgeon: Burnell Blanks, MD;  Location: [place] [facility];  Service: Cardiovascular;  Laterality: N/A;   CARPAL TUNNEL RELEASE Left    CORONARY ANGIOPLASTY WITH STENT PLACEMENT  05/2015   DES placed to the mid and distal LAD as well as OM2/notes 01/13/2016   CORONARY ANGIOPLASTY WITH STENT PLACEMENT  ~ 2010   CORONARY ARTERY BYPASS GRAFT  11/11/2015   CABG X2 LIMA-LAD and SVG-OM/notes 01/13/2016   DILATION AND CURETTAGE OF UTERUS     FEMORAL-POPLITEAL BYPASS GRAFT Left 2013   /notes 01/13/2016   LAPAROSCOPIC GASTRIC BANDING  2007   RENAL ARTERY STENT     VAGINAL HYSTERECTOMY     Patient Active Problem List   Diagnosis Date Noted   Nausea and vomiting 10/05/2016   Nausea & vomiting 10/04/2016   Fistula 09/30/2016   Intractable nausea and vomiting 09/18/2016   Lower extremity edema 05/10/2016   Ischemic cardiomyopathy 02/23/2016   Chills 02/23/2016   Weight loss 02/23/2016   Unstable angina pectoris ([place]) 01/13/2016   Chest pain    PVD (peripheral vascular disease) ([place])    Coronary artery disease involving coronary bypass graft of native heart with angina pectoris ([place])    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   Acute blood loss anemia 11/13/2015   Leukocytosis 11/13/2015   Chronic pain 11/12/2015   Acute postoperative pain 11/12/2015   S/P CABG x 2 11/11/2015   Acute UTI 11/05/2015   Edema of left lower extremity 06/16/2015   Edema of upper extremity 06/16/2015   Knee pain, left 06/14/2015   AKI (acute kidney injury) ([place]) 06/11/2015   Hyperkalemia 06/11/2015   Hypomagnesemia 06/11/2015   Acute leg pain, left 05/20/2015   Multiple bruises 05/20/2015   Recurrent urinary tract infection 03/26/2014   Recurrent UTI 03/26/2014   H/O gastric bypass 11/07/2013   Peripheral neuropathy 10/10/2013   Encounter for antineoplastic chemotherapy 10/10/2013   Multiple myeloma  ([place]) 07/16/2013   Critical ischemia of lower extremity 05/15/2013   Critical lower limb ischemia 05/15/2013   Intermittent claudication ([place]) 07/27/2011   Chronic coronary artery disease 04/26/2011   Gastroesophageal reflux disease 04/26/2011   Type 2 diabetes mellitus ([place]) 03/30/2011   Hyperlipidemia, unspecified 03/30/2011   Hypertension 03/30/2011      Prior to Admission medications   Medication Sig Start Date End Date Taking? Authorizing Provider  Alirocumab (PRALUENT) 75 MG/ML SOPN Inject 1 pen into the skin every 14 (fourteen) days. STUDY MEDICATION 05/06/16   Dorothy Spark, MD  aspirin 81 MG tablet Take 81 mg by mouth daily.    [provider]  Cholecalciferol (VITAMIN D3) 50000 units CAPS Take 1 capsule by mouth 2 (two) times a week. Sunday AND Wednesday    [provider]  clopidogrel (PLAVIX) 75 MG tablet  04/13/17   [provider]  diclofenac sodium (VOLTAREN) 1 % GEL Apply 2 g topically daily as needed for pain.    [provider]  escitalopram (LEXAPRO) 10 MG tablet Take 10 mg by mouth daily.    [provider]  famotidine (PEPCID) 20 MG tablet Take 1 tablet (20 mg total) by mouth daily. 08/23/18 08/23/19  Merlyn Lot, MD  feeding supplement, ENSURE ENLIVE, (ENSURE ENLIVE) LIQD Take 237 mLs by mouth 2 (two) times daily between meals. 10/06/16   Geradine Girt, DO  fentaNYL (DURAGESIC - DOSED MCG/HR) 50 MCG/HR Place 50 mcg onto the skin every 3 (three) days.     [provider]  fluticasone (FLONASE) 50 MCG/ACT nasal spray Place 2 sprays into both nostrils daily as needed for allergies.  03/08/16   [provider]  furosemide (LASIX) 20 MG tablet Take 1 tablet (20 mg total) by mouth daily as needed for fluid or edema. 05/10/16 01/11/18  Dorothy Spark, MD  gabapentin (NEURONTIN) 300 MG capsule Take 300-600 mg by mouth See admin instructions. Pt takes 633m in am, 6060mat bedtime    [provider]  hydrocortisone cream 1 % Apply topically as needed for itching. 01/15/16   SmArbutus LeasNP  HYDROmorphone (DILAUDID) 2 MG tablet Take 2 mg by mouth as needed. 11/17/17   [provider]  Insulin Glargine (LANTUS SOLOSTAR) 100 UNIT/ML Solostar Pen Inject 18 Units into the skin daily. 06/16/17   [provider]  isosorbide mononitrate (IMDUR) 60 MG 24 hr tablet Take 1 tablet (60 mg total) by mouth daily. Please make yearly appt with Dr. NeMeda Coffeeor September before anymore refills. 1st attempt 07/25/17   NeDorothy SparkMD  lisinopril (PRINIVIL,ZESTRIL) 10 MG tablet Take 10 mg by mouth daily.    [provider]  lisinopril (PRINIVIL,ZESTRIL) 5 MG tablet Take 1 tablet (5 mg total) by mouth  daily. 01/16/16   Arbutus Leas, NP  magnesium oxide (MAG-OX) 400 MG tablet Take 400 mg by mouth daily.    [provider]  metFORMIN (GLUCOPHAGE) 500 MG tablet Take 500 mg by mouth 2 (two) times daily with a meal.     [provider]  metoprolol succinate (TOPROL-XL) 25 MG 24 hr tablet Take by mouth.    [provider]  metoprolol succinate (TOPROL-XL) 25 MG 24 hr tablet Take 12.5 mg by mouth 2 (two) times daily.    [provider]  metoprolol tartrate (LOPRESSOR) 25 MG tablet Take 0.5 tablets (12.5 mg total) by mouth 2 (two) times daily. 02/23/16 09/17/16  Dorothy Spark, MD  nitroGLYCERIN (NITROSTAT) 0.4 MG SL tablet Place 1 tablet (0.4 mg total) under the tongue every 5 (five) minutes as needed for chest pain. 01/15/16   Arbutus Leas, NP  Omega-3 Fatty Acids (FISH OIL) 1000 MG CAPS Take 2 capsules (2,000 mg total) by mouth daily. Patient not taking: Reported on 01/11/2018 05/06/16   Dorothy Spark, MD  ondansetron (ZOFRAN ODT) 4 MG disintegrating tablet Take 1 tablet (4 mg total) by mouth every 8 (eight) hours as needed for nausea or vomiting. 08/23/18   Merlyn Lot, MD  pantoprazole (PROTONIX) 40 MG tablet Take 1 tablet (40 mg  total) by mouth daily at 6 (six) AM. 10/07/16   Geradine Girt, DO  promethazine (PHENERGAN) 12.5 MG tablet Take 1 tablet (12.5 mg total) by mouth every 6 (six) hours. 01/11/18   Schuyler Amor, MD  Vitamin D, Ergocalciferol, (DRISDOL) 50000 units CAPS capsule  05/06/17   [provider]    Allergies Other, Statins, Metoclopramide, Amoxicillin, Codeine, Sulfa antibiotics, and Duloxetine    Social History Social History   Tobacco Use   Smoking status: Never Smoker   Smokeless tobacco: Never Used  Substance Use Topics   Alcohol use: Yes    Comment: occasional wine   Drug use: No    Review of Systems Patient denies headaches, rhinorrhea, blurry vision, numbness, shortness of breath, chest pain, edema, cough, abdominal pain, nausea, vomiting, diarrhea, dysuria, fevers, rashes or hallucinations unless otherwise stated above in HPI. ____________________________________________   PHYSICAL EXAM:  VITAL SIGNS: Vitals:   08/23/18 1000 08/23/18 1100  BP: (!) 148/65 (!) 146/67  Pulse: 68 69  Resp: 16 13  Temp:    SpO2: 97% 97%    Constitutional: Alert and oriented.  Eyes: Conjunctivae are normal.  Head: Atraumatic. Nose: No congestion/rhinnorhea. Mouth/Throat: Mucous membranes are moist.   Neck: No stridor. Painless ROM.  Cardiovascular: Normal rate, regular rhythm. Grossly normal heart sounds.  Good peripheral circulation. Respiratory: Normal respiratory effort.  No retractions. Lungs CTAB. Gastrointestinal: Soft and nontender. No distention. No abdominal bruits. No CVA tenderness. Genitourinary:  Musculoskeletal: No lower extremity tenderness nor edema.  No joint effusions. Neurologic:  Normal speech and language. No gross focal neurologic deficits are appreciated. No facial droop Skin:  Skin is warm, dry and intact. No rash noted. Psychiatric: Mood and affect are normal. Speech and behavior are normal.  ____________________________________________    LABS (all labs ordered are listed, but only abnormal results are displayed)  Results for orders placed or performed during the hospital encounter of 08/23/18 (from the past 24 hour(s))  CBC with Differential/Platelet     Status: Abnormal   Collection Time: 08/23/18  8:04 AM  Result Value Ref Range   WBC 8.0 4.0 - 10.5 K/uL   RBC 4.41 3.87 -  5.11 MIL/uL   Hemoglobin 10.8 (L) 12.0 - 15.0 g/dL   HCT 34.6 (L) 36.0 - 46.0 %   MCV 78.5 (L) 80.0 - 100.0 fL   MCH 24.5 (L) 26.0 - 34.0 pg   MCHC 31.2 30.0 - 36.0 g/dL   RDW 14.6 11.5 - 15.5 %   Platelets 184 150 - 400 K/uL   nRBC 0.0 0.0 - 0.2 %   Neutrophils Relative % 58 %   Neutro Abs 4.5 1.7 - 7.7 K/uL   Lymphocytes Relative 39 %   Lymphs Abs 3.1 0.7 - 4.0 K/uL   Monocytes Relative 3 %   Monocytes Absolute 0.3 0.1 - 1.0 K/uL   Eosinophils Relative 0 %   Eosinophils Absolute 0.0 0.0 - 0.5 K/uL   Basophils Relative 0 %   Basophils Absolute 0.0 0.0 - 0.1 K/uL   Immature Granulocytes 0 %   Abs Immature Granulocytes 0.03 0.00 - 0.07 K/uL   Polychromasia PRESENT   Comprehensive metabolic panel     Status: Abnormal   Collection Time: 08/23/18  8:04 AM  Result Value Ref Range   Sodium 133 (L) 135 - 145 mmol/L   Potassium 4.2 3.5 - 5.1 mmol/L   Chloride 97 (L) 98 - 111 mmol/L   CO2 22 22 - 32 mmol/L   Glucose, Bld 170 (H) 70 - 99 mg/dL   BUN 20 8 - 23 mg/dL   Creatinine, Ser 1.38 (H) 0.44 - 1.00 mg/dL   Calcium 9.2 8.9 - 10.3 mg/dL   Total Protein 6.9 6.5 - 8.1 g/dL   Albumin 3.3 (L) 3.5 - 5.0 g/dL   AST 18 15 - 41 U/L   ALT 8 0 - 44 U/L   Alkaline Phosphatase 73 38 - 126 U/L   Total Bilirubin 0.7 0.3 - 1.2 mg/dL   GFR calc non Af Amer 37 (L) >60 mL/min   GFR calc Af Amer 43 (L) >60 mL/min   Anion gap 14 5 - 15  Troponin I (High Sensitivity)     Status: None   Collection Time: 08/23/18  8:04 AM  Result Value Ref Range   Troponin I (High Sensitivity) 8 <18 ng/L  Troponin I (High Sensitivity)     Status: None   Collection Time:  08/23/18 10:25 AM  Result Value Ref Range   Troponin I (High Sensitivity) 7 <18 ng/L  Lipase, blood     Status: None   Collection Time: 08/23/18 10:25 AM  Result Value Ref Range   Lipase 24 11 - 51 U/L   ____________________________________________  EKG My review and personal interpretation at Time: 8:12   Indication: nausea  Rate: 60  Rhythm: sinus Axis: normal Other: normal intervals, no stemi ____________________________________________  RADIOLOGY  I personally reviewed all radiographic images ordered to evaluate for the above acute complaints and reviewed radiology reports and findings.  These findings were personally discussed with the patient.  Please see medical record for radiology report.  ____________________________________________   PROCEDURES  Procedure(s) performed:  Procedures    Critical Care performed: no ____________________________________________   INITIAL IMPRESSION / ASSESSMENT AND PLAN / ED COURSE  Pertinent labs & imaging results that were available during my care of the patient were reviewed by me and considered in my medical decision making (see chart for details).   DDX: dehdyration, electrolyte abn, chf, copd, pna  [name] is a 78 y.o. who presents to the ED with nausea and dry heaves as described above.  She is nontoxic  appearing.  Will give gentle IV hydration.  Does have multiple comorbidities and given recent diagnosis of COVID-19 will obtain blood work provide symptomatic management and reassess.  She is not hypoxic.  Her abdominal exam is otherwise benign.  Has history of chronic nausea and intermittent episodes of dry heaving.  We will give antiemetic and reassess.  Clinical Course as of Aug 22 1157  Wed Aug 23, 2018  0903 Blood work thus far is reassuring.  We will continue with IV hydration as well as antiemetic.  Does have chronic renal insufficiency that is at baseline and actually slightly improved as compared to results  in care everywhere.   [PR]  1153 Patient reassessed.  Feels much improved.  Repeat abdominal exam soft and benign.  She is tolerating oral hydration.  At this point do believe she stable and appropriate for outpatient follow-up.   [PR]    Clinical Course User Index [PR] Merlyn Lot, MD    The patient was evaluated in Emergency Department today for the symptoms described in the history of present illness. He/she was evaluated in the context of the global COVID-19 pandemic, which necessitated consideration that the patient might be at risk for infection with the SARS-CoV-2 virus that causes COVID-19. Institutional protocols and algorithms that pertain to the evaluation of patients at risk for COVID-19 are in a state of rapid change based on information released by regulatory bodies including the CDC and federal and state organizations. These policies and algorithms were followed during the patient's care in the ED.  As part of my medical decision making, I reviewed the following data within the [place] notes reviewed and incorporated, Labs reviewed, notes from prior ED visits and [place] Controlled Substance Database   ____________________________________________   FINAL CLINICAL IMPRESSION(S) / ED DIAGNOSES  Final diagnoses:  Nausea  COVID-19 virus infection      NEW MEDICATIONS STARTED DURING THIS VISIT:  New Prescriptions   FAMOTIDINE (PEPCID) 20 MG TABLET    Take 1 tablet (20 mg total) by mouth daily.   ONDANSETRON (ZOFRAN ODT) 4 MG DISINTEGRATING TABLET    Take 1 tablet (4 mg total) by mouth every 8 (eight) hours as needed for nausea or vomiting.     Note:  This document was prepared using Dragon voice recognition software and may include unintentional dictation errors.    Merlyn Lot, MD 08/23/18 1159

## 2018-08-26 ENCOUNTER — Emergency Department: Payer: Medicare Other

## 2018-08-26 ENCOUNTER — Other Ambulatory Visit: Payer: Self-pay

## 2018-08-26 ENCOUNTER — Emergency Department
Admission: EM | Admit: 2018-08-26 | Discharge: 2018-08-27 | Disposition: A | Discharge status: Other Acute Inpatient Hospital | Payer: Medicare Other | Attending: Emergency Medicine | Admitting: Emergency Medicine

## 2018-08-26 ENCOUNTER — Encounter: Payer: Self-pay | Admitting: *Deleted

## 2018-08-26 DIAGNOSIS — Z7902 Long term (current) use of antithrombotics/antiplatelets: Secondary | ICD-10-CM | POA: Insufficient documentation

## 2018-08-26 DIAGNOSIS — Z951 Presence of aortocoronary bypass graft: Secondary | ICD-10-CM | POA: Diagnosis not present

## 2018-08-26 DIAGNOSIS — U071 COVID-19: Secondary | ICD-10-CM | POA: Diagnosis not present

## 2018-08-26 DIAGNOSIS — Z7982 Long term (current) use of aspirin: Secondary | ICD-10-CM | POA: Diagnosis not present

## 2018-08-26 DIAGNOSIS — N289 Disorder of kidney and ureter, unspecified: Secondary | ICD-10-CM | POA: Diagnosis not present

## 2018-08-26 DIAGNOSIS — R110 Nausea: Secondary | ICD-10-CM

## 2018-08-26 DIAGNOSIS — Z79899 Other long term (current) drug therapy: Secondary | ICD-10-CM | POA: Insufficient documentation

## 2018-08-26 DIAGNOSIS — I252 Old myocardial infarction: Secondary | ICD-10-CM | POA: Insufficient documentation

## 2018-08-26 DIAGNOSIS — R410 Disorientation, unspecified: Secondary | ICD-10-CM | POA: Diagnosis not present

## 2018-08-26 DIAGNOSIS — I509 Heart failure, unspecified: Secondary | ICD-10-CM | POA: Diagnosis not present

## 2018-08-26 DIAGNOSIS — I2510 Atherosclerotic heart disease of native coronary artery without angina pectoris: Secondary | ICD-10-CM | POA: Insufficient documentation

## 2018-08-26 DIAGNOSIS — Z794 Long term (current) use of insulin: Secondary | ICD-10-CM | POA: Insufficient documentation

## 2018-08-26 DIAGNOSIS — E119 Type 2 diabetes mellitus without complications: Secondary | ICD-10-CM | POA: Insufficient documentation

## 2018-08-26 DIAGNOSIS — N179 Acute kidney failure, unspecified: Secondary | ICD-10-CM

## 2018-08-26 DIAGNOSIS — I110 Hypertensive heart disease with heart failure: Secondary | ICD-10-CM | POA: Diagnosis not present

## 2018-08-26 LAB — CBC
HCT: 35.7 % — ABNORMAL LOW (ref 36.0–46.0)
Hemoglobin: 11.4 g/dL — ABNORMAL LOW (ref 12.0–15.0)
MCH: 24.6 pg — ABNORMAL LOW (ref 26.0–34.0)
MCHC: 31.9 g/dL (ref 30.0–36.0)
MCV: 76.9 fL — ABNORMAL LOW (ref 80.0–100.0)
Platelets: 240 10*3/uL (ref 150–400)
RBC: 4.64 MIL/uL (ref 3.87–5.11)
RDW: 14.7 % (ref 11.5–15.5)
WBC: 8 10*3/uL (ref 4.0–10.5)
nRBC: 0 % (ref 0.0–0.2)

## 2018-08-26 LAB — COMPREHENSIVE METABOLIC PANEL
ALT: 11 U/L (ref 0–44)
AST: 28 U/L (ref 15–41)
Albumin: 3.3 g/dL — ABNORMAL LOW (ref 3.5–5.0)
Alkaline Phosphatase: 65 U/L (ref 38–126)
Anion gap: 14 (ref 5–15)
BUN: 28 mg/dL — ABNORMAL HIGH (ref 8–23)
CO2: 22 mmol/L (ref 22–32)
Calcium: 8.9 mg/dL (ref 8.9–10.3)
Chloride: 94 mmol/L — ABNORMAL LOW (ref 98–111)
Creatinine, Ser: 1.36 mg/dL — ABNORMAL HIGH (ref 0.44–1.00)
GFR calc Af Amer: 43 mL/min — ABNORMAL LOW (ref >60)
GFR calc non Af Amer: 37 mL/min — ABNORMAL LOW (ref >60)
Glucose, Bld: 128 mg/dL — ABNORMAL HIGH (ref 70–99)
Potassium: 3.9 mmol/L (ref 3.5–5.1)
Sodium: 130 mmol/L — ABNORMAL LOW (ref 135–145)
Total Bilirubin: 0.8 mg/dL (ref 0.3–1.2)
Total Protein: 7.4 g/dL (ref 6.5–8.1)

## 2018-08-26 LAB — URINALYSIS, COMPLETE (UACMP) WITH MICROSCOPIC
Bilirubin Urine: NEGATIVE
Glucose, UA: NEGATIVE mg/dL
Hgb urine dipstick: NEGATIVE
Ketones, ur: 5 mg/dL — AB
Leukocytes,Ua: NEGATIVE
Nitrite: POSITIVE — AB
Protein, ur: NEGATIVE mg/dL
Specific Gravity, Urine: 1.011 (ref 1.005–1.030)
pH: 5 (ref 5.0–8.0)

## 2018-08-26 LAB — GLUCOSE, CAPILLARY: Glucose-Capillary: 128 mg/dL — ABNORMAL HIGH (ref 70–99)

## 2018-08-26 LAB — LIPASE, BLOOD: Lipase: 27 U/L (ref 11–51)

## 2018-08-26 MED ORDER — PROMETHAZINE HCL 25 MG/ML IJ SOLN
6.2500 mg | Freq: Once | INTRAMUSCULAR | Status: AC
  Administered 2018-08-26: 21:00:00 6.25 mg via INTRAVENOUS
  Filled 2018-08-26: qty 1

## 2018-08-26 MED ORDER — PROMETHAZINE HCL 25 MG/ML IJ SOLN
6.2500 mg | Freq: Once | INTRAMUSCULAR | Status: AC
  Administered 2018-08-26: 23:00:00 6.25 mg via INTRAVENOUS
  Filled 2018-08-26: qty 1

## 2018-08-26 MED ORDER — SODIUM CHLORIDE 0.9 % IV BOLUS
250.0000 mL | Freq: Once | INTRAVENOUS | Status: AC
  Administered 2018-08-26: 250 mL via INTRAVENOUS

## 2018-08-26 MED ORDER — SODIUM CHLORIDE 0.9 % IV BOLUS
500.0000 mL | Freq: Once | INTRAVENOUS | Status: AC
  Administered 2018-08-26: 21:00:00 500 mL via INTRAVENOUS

## 2018-08-26 MED ORDER — ACETAMINOPHEN 325 MG PO TABS: 650.0000 mg | ORAL_TABLET | Freq: Once | ORAL | Status: DC

## 2018-08-26 MED ORDER — ONDANSETRON HCL 4 MG/2ML IJ SOLN
4.0000 mg | Freq: Once | INTRAMUSCULAR | Status: AC
  Administered 2018-08-26: 23:00:00 4 mg via INTRAVENOUS
  Filled 2018-08-26: qty 2

## 2018-08-26 NOTE — ED Provider Notes (Signed)
[facility] Emergency Department Provider Note   ____________________________________________   First MD Initiated Contact with Patient 08/26/18 1946     (approximate)  I have reviewed the triage vital signs and the nursing notes.   HISTORY  Chief Complaint Emesis    HPI [name] is a 78 y.o. female history of congestive heart failure, coronary disease, hypertension cardiomyopathy recent COVID-19 infection  Patient here for intractable nausea.  Diagnosed with COVID-19 about 10 days ago, she reports that her symptoms have been nausea.  She denies any cough or chills.  She has experienced fever.  She has been given Phenergan IV that helped in the past and has been using Zofran for nausea at home but it does not work  Reports she just barely able to eat anything last 2 days.  No vomiting.  Reports she does not had a bowel movement for at least a few days, but does still pass gas.  No chest pain no shortness of breath.  Able to walk, but just reports she just continues to feel so nauseated she cannot hardly eat anything.  Ate toast a couple times today and really did not drink much at all  Past Medical History:  Diagnosis Date  . Anginal pain ([place])   . Anxiety   . Arthritis    "shoulders primarily" (01/13/2016)  . CAD (coronary artery disease)    s/p CABG in Oct 2017  . CHF (congestive heart failure) ([place])   . Chronic pain    from her multiple myeloma but says that her pain is usually in her legs/notes 01/13/2016  . High cholesterol   . History of blood transfusion    "numerous; related to procedures for my heart" (01/13/2016)  . Hypertension   . Ischemic cardiomyopathy    /nots 01/13/2016  . MI (myocardial infarction) ([place])    EKG on arrival 01/13/2016 showed NSR with evidence of old anterior and inferior infarcts  . MI (myocardial infarction) ([place]) 08/2015  . Multiple myeloma ([place]) dx'd 2015   off chemo since 2016  . Nausea and vomiting  05/10/2016   3-4 month hx of nausea and vomiting  . PAD (peripheral artery disease) ([place])   . Stroke [facility])    "several in 1 year; ? year" (01/13/2016)  . Type II diabetes mellitus [facility])     Patient Active Problem List   Diagnosis Date Noted  . Nausea and vomiting 10/05/2016  . Nausea & vomiting 10/04/2016  . Fistula 09/30/2016  . Intractable nausea and vomiting 09/18/2016  . Lower extremity edema 05/10/2016  . Ischemic cardiomyopathy 02/23/2016  . Chills 02/23/2016  . Weight loss 02/23/2016  . Unstable angina pectoris ([place]) 01/13/2016  . Chest pain   . PVD (peripheral vascular disease) ([place])   . Coronary artery disease involving coronary bypass graft of native heart with angina pectoris ([place])   . Peripheral arterial occlusive disease ([place]) 12/15/2015  . Acute blood loss anemia 11/13/2015  . Leukocytosis 11/13/2015  . Chronic pain 11/12/2015  . Acute postoperative pain 11/12/2015  . 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 Acute UTI 11/05/2015  . Edema of left lower extremity 06/16/2015  . Edema of upper extremity 06/16/2015  . Knee pain, left 06/14/2015  . AKI (acute kidney injury) ([place]) 06/11/2015  . Hyperkalemia 06/11/2015  . Hypomagnesemia 06/11/2015  . Acute leg pain, left 05/20/2015  . Multiple bruises 05/20/2015  . Recurrent urinary tract infection 03/26/2014  . Recurrent UTI 03/26/2014  . H/O gastric  bypass 11/07/2013  . Peripheral neuropathy 10/10/2013  . Encounter for antineoplastic chemotherapy 10/10/2013  . Multiple myeloma ([place]) 07/16/2013  . Critical ischemia of lower extremity 05/15/2013  . Critical lower limb ischemia 05/15/2013  . Intermittent claudication ([place]) 07/27/2011  . Chronic coronary artery disease 04/26/2011  . Gastroesophageal reflux disease 04/26/2011  . Type 2 diabetes mellitus ([place]) 03/30/2011  . Hyperlipidemia, unspecified 03/30/2011  . Hypertension 03/30/2011    Past Surgical History:  Procedure Laterality Date  . CARDIAC CATHETERIZATION N/A  01/14/2016   Procedure: Left Heart Cath and Cors/Grafts Angiography;  Surgeon: Burnell Blanks, MD;  Location: [place] [facility];  Service: Cardiovascular;  Laterality: N/A;  . CARPAL TUNNEL RELEASE Left   . CORONARY ANGIOPLASTY WITH STENT PLACEMENT  05/2015   DES placed to the mid and distal LAD as well as OM2/notes 01/13/2016  . CORONARY ANGIOPLASTY WITH STENT PLACEMENT  ~ 2010  . CORONARY ARTERY BYPASS GRAFT  11/11/2015   CABG X2 LIMA-LAD and SVG-OM/notes 01/13/2016  . DILATION AND CURETTAGE OF UTERUS    . FEMORAL-POPLITEAL BYPASS GRAFT Left 2013   Archie Endo 01/13/2016  . LAPAROSCOPIC GASTRIC BANDING  2007  . RENAL ARTERY STENT    . VAGINAL HYSTERECTOMY      Prior to Admission medications   Medication Sig Start Date End Date Taking? Authorizing Provider  Alirocumab (PRALUENT) 75 MG/ML SOPN Inject 1 pen into the skin every 14 (fourteen) days. STUDY MEDICATION 05/06/16   Dorothy Spark, MD  aspirin 81 MG tablet Take 81 mg by mouth daily.    [provider]  Cholecalciferol (VITAMIN D3) 50000 units CAPS Take 1 capsule by mouth 2 (two) times a week. Sunday AND Wednesday    [provider]  clopidogrel (PLAVIX) 75 MG tablet  04/13/17   [provider]  diclofenac sodium (VOLTAREN) 1 % GEL Apply 2 g topically daily as needed for pain.    [provider]  escitalopram (LEXAPRO) 10 MG tablet Take 10 mg by mouth daily.    [provider]  famotidine (PEPCID) 20 MG tablet Take 1 tablet (20 mg total) by mouth daily. 08/23/18 08/23/19  Merlyn Lot, MD  feeding supplement, ENSURE ENLIVE, (ENSURE ENLIVE) LIQD Take 237 mLs by mouth 2 (two) times daily between meals. 10/06/16   Geradine Girt, DO  fentaNYL (DURAGESIC - DOSED MCG/HR) 50 MCG/HR Place 50 mcg onto the skin every 3 (three) days.     [provider]  fluticasone (FLONASE) 50 MCG/ACT nasal spray Place 2 sprays into both nostrils daily as needed for allergies.  03/08/16   [provider]  furosemide (LASIX) 20 MG tablet Take 1 tablet (20 mg total) by mouth daily as needed for fluid or edema. 05/10/16 01/11/18  Dorothy Spark, MD  gabapentin (NEURONTIN) 300 MG capsule Take 300-600 mg by mouth See admin instructions. Pt takes 629m in am, 6010mat bedtime    [provider]  hydrocortisone cream 1 % Apply topically as needed for itching. 01/15/16   SmArbutus LeasNP  HYDROmorphone (DILAUDID) 2 MG tablet Take 2 mg by mouth as needed. 11/17/17   [provider]  Insulin Glargine (LANTUS SOLOSTAR) 100 UNIT/ML Solostar Pen Inject 18 Units into the skin daily. 06/16/17   [provider]  isosorbide mononitrate (IMDUR) 60 MG 24 hr tablet Take 1 tablet (60 mg total) by mouth daily. Please make yearly appt with Dr. NeMeda Coffeeor September before anymore refills. 1st attempt 07/25/17   NeMeda Coffee  Jamse Belfast, MD  lisinopril (PRINIVIL,ZESTRIL) 10 MG tablet Take 10 mg by mouth daily.    [provider]  lisinopril (PRINIVIL,ZESTRIL) 5 MG tablet Take 1 tablet (5 mg total) by mouth daily. 01/16/16   Arbutus Leas, NP  magnesium oxide (MAG-OX) 400 MG tablet Take 400 mg by mouth daily.    [provider]  metFORMIN (GLUCOPHAGE) 500 MG tablet Take 500 mg by mouth 2 (two) times daily with a meal.     [provider]  metoprolol succinate (TOPROL-XL) 25 MG 24 hr tablet Take by mouth.    [provider]  metoprolol succinate (TOPROL-XL) 25 MG 24 hr tablet Take 12.5 mg by mouth 2 (two) times daily.    [provider]  metoprolol tartrate (LOPRESSOR) 25 MG tablet Take 0.5 tablets (12.5 mg total) by mouth 2 (two) times daily. 02/23/16 09/17/16  Dorothy Spark, MD  nitroGLYCERIN (NITROSTAT) 0.4 MG SL tablet Place 1 tablet (0.4 mg total) under the tongue every 5 (five) minutes as needed for chest pain. 01/15/16   Arbutus Leas, NP  Omega-3 Fatty Acids (FISH OIL) 1000 MG CAPS Take 2 capsules (2,000 mg total) by mouth daily. Patient not  taking: Reported on 01/11/2018 05/06/16   Dorothy Spark, MD  ondansetron (ZOFRAN ODT) 4 MG disintegrating tablet Take 1 tablet (4 mg total) by mouth every 8 (eight) hours as needed for nausea or vomiting. 08/23/18   Merlyn Lot, MD  pantoprazole (PROTONIX) 40 MG tablet Take 1 tablet (40 mg total) by mouth daily at 6 (six) AM. 10/07/16   Geradine Girt, DO  promethazine (PHENERGAN) 12.5 MG tablet Take 1 tablet (12.5 mg total) by mouth every 6 (six) hours. 01/11/18   Schuyler Amor, MD  Vitamin D, Ergocalciferol, (DRISDOL) 50000 units CAPS capsule  05/06/17   [provider]    Allergies Other, Statins, Metoclopramide, Amoxicillin, Codeine, Sulfa antibiotics, and Duloxetine  Family History  Problem Relation Age of Onset  . Diabetes Mother   . Heart failure Mother   . Prostate cancer Neg Hx   . Kidney cancer Neg Hx   . Bladder Cancer Neg Hx   . Colon cancer Neg Hx   . Stomach cancer Neg Hx     Social History Social History   Tobacco Use  . Smoking status: Never Smoker  . Smokeless tobacco: Never Used  Substance Use Topics  . Alcohol use: Yes    Comment: occasional wine  . Drug use: No    Review of Systems Constitutional: Occasional fever no chills.  Feels fatigued Eyes: No visual changes. ENT: No sore throat. Cardiovascular: Denies chest pain. Respiratory: Denies shortness of breath. Gastrointestinal: No abdominal pain.  Reports severe nausea only. Genitourinary: Negative for dysuria. Musculoskeletal: Negative for back pain. Skin: Negative for rash. Neurological: Negative for headaches, areas of focal weakness or numbness.    ____________________________________________   PHYSICAL EXAM:  VITAL SIGNS: ED Triage Vitals  Enc Vitals Group     BP 08/26/18 1939 (!) 149/59     Pulse Rate 08/26/18 1939 88     Resp 08/26/18 1939 16     Temp 08/26/18 1939 100.2 F (37.9 C)     Temp Source 08/26/18 1939 Oral     SpO2 08/26/18 1939 99 %     Weight --       Height --      Head Circumference --      Peak Flow --  Pain Score 08/26/18 1940 0     Pain Loc --      Pain Edu? --      Excl. in [place]? --     Constitutional: Alert and oriented.  In no acute distress but appears quite nauseated. Eyes: Conjunctivae are normal. Head: Atraumatic. Nose: No congestion/rhinnorhea. Mouth/Throat: Mucous membranes are just slightly dry. Neck: No stridor.  Cardiovascular: Normal rate, regular rhythm. Grossly normal heart sounds.  Good peripheral circulation. Respiratory: Normal respiratory effort.  No retractions. Lungs CTAB.  Absolutely normal respirations, normal oxygen saturation and clear lungs. Gastrointestinal: Soft and nontender. No distention.  There is no abdominal tenderness but she does report she just feels nauseated whenever examined to the abdomen. Musculoskeletal: No lower extremity tenderness nor edema. Neurologic:  Normal speech and language. No gross focal neurologic deficits are appreciated.  Skin:  Skin is warm, dry and intact. No rash noted. Psychiatric: Mood and affect are normal. Speech and behavior are normal.  ____________________________________________   LABS (all labs ordered are listed, but only abnormal results are displayed)  Labs Reviewed  CBC - Abnormal; Notable for the following components:      Result Value   Hemoglobin 11.4 (*)    HCT 35.7 (*)    MCV 76.9 (*)    MCH 24.6 (*)    All other components within normal limits  COMPREHENSIVE METABOLIC PANEL - Abnormal; Notable for the following components:   Sodium 130 (*)    Chloride 94 (*)    Glucose, Bld 128 (*)    BUN 28 (*)    Creatinine, Ser 1.36 (*)    Albumin 3.3 (*)    GFR calc non Af Amer 37 (*)    GFR calc Af Amer 43 (*)    All other components within normal limits  LIPASE, BLOOD  URINALYSIS, COMPLETE (UACMP) WITH MICROSCOPIC  CBG MONITORING, ED   ____________________________________________  EKG  Reviewed interpreted at 2130 Heart rate 90 QRS  90 QTc 420 Sinus rhythm, probable old anteroseptal infarct.  No acute ST elevation or evidence of acute ischemia denoted except there is some slight T wave depression in 1 and aVL compared with previous EKG from 2 days ago, no significant or appreciable changes noted. ____________________________________________  [id number]  Dg Chest Portable 1 View  Result Date: 08/26/2018 CLINICAL DATA:  Nausea. Poor p.o. intake. Vomiting. Diagnosed with COVID-19 5 days ago. EXAM: PORTABLE CHEST 1 VIEW COMPARISON:  08/23/2018. FINDINGS: Normal sized heart. Mildly tortuous and calcified thoracic aorta. Stable mild diffuse peribronchial thickening and accentuation of the interstitial markings. Interval minimal patchy opacity in the lateral aspect of the right mid upper lung zone. Mild linear densities in both lungs with mild progression. Stable post CABG changes, lower thoracic spine degenerative changes and diffuse osteopenia. A stat after a IMPRESSION: 1. Interval minimal patchy opacity in right mid upper lung zone laterally. This may represent early lung changes due to the patient's COVID-19. 2. Mildly progressive linear atelectasis in both lungs. 3. Stable mild chronic bronchitic changes. Electronically Signed   By: Claudie Revering M.D.   On: 08/26/2018 21:49   Dg Abd 2 Views  Result Date: 08/26/2018 CLINICAL DATA:  Nausea. Poor oral intake. Diagnosed with COVID-19 5 days ago. Three episodes of vomiting today. EXAM: ABDOMEN - 2 VIEW COMPARISON:  10/04/2016. FINDINGS: Normal bowel gas pattern. No significant stool. Stable gastric band. Interval 4 mm left mid abdominal calcification. Stable thoracolumbar scoliosis and degenerative changes. Atheromatous arterial calcifications without visible aneurysm. Post CABG changes.  Bilateral proximal thigh vascular stents. IMPRESSION: 1. No acute abnormality. 2. Interval 4 mm left mid abdominal calcification. This could represent a phlebolith or urinary tract calcification.  Electronically Signed   By: Claudie Revering M.D.   On: 08/26/2018 21:45   X-ray, concerning for minimal patchy opacity.  Feel this is likely related to known COVID.  No acute findings on abdominal film.   CT of the head is pending at time of signout to Dr. Rip Harbour   Ongoing____________________________________________   PROCEDURES  Procedure(s) performed: None  Procedures  Critical Care performed: No  ____________________________________________   INITIAL IMPRESSION / ASSESSMENT AND PLAN / ED COURSE  Pertinent labs & imaging results that were available during my care of the patient were reviewed by me and considered in my medical decision making (see chart for details).   Patient known COVID positive, ongoing symptoms of nausea.  Poor appetite.  Denies acute respiratory symptoms.  Low-grade fevers noted.  She reports just intractable nausea, seen just a couple days ago for same and reports she continues to feel so severely nauseated she can barely eat anything, becoming fatigued.  Not any pain or distress.  Clinical Course as of Aug 25 2298  Sat Aug 26, 2018  2242 Reevaluation, patient still having intractable nausea, sitting up slight dry heaving.  Appears uncomfortable, nauseated.  Requesting additional Phenergan, will give additional Phenergan and ondansetron with additional IV fluid.  She appears quite uncomfortable with nausea.  Thus far her work-up reassuring, some but I suspect is mild acute kidney injury based on review of previous labs and her history, also likely dehydrated, suspect etiology of nausea is linked to [place].    [MQ]    Clinical Course User Index [MQ] Delman Kitten, MD   Patient is noted to have just some very mild confusion, I do not see a known history of dementia, she seems to be just slightly confused at times, nothing major, but does not recall being interviewed by the doctor, does not recall getting medication earlier.  Unclear as to exact etiology, will  obtain CT the head without contrast.  Ongoing care and disposition assigned to Dr. Rip Harbour, follow-up noncontrast head CT, reevaluation after additional fluids and antiemetics.  If the patient continues to have intractable nausea, confusion, or other concerns arise I feel she will need to be admitted for treatment of COVID-19.  Urinalysis also pending.    ____________________________________________   FINAL CLINICAL IMPRESSION(S) / ED DIAGNOSES  Final diagnoses:  Nausea  AKI (acute kidney injury) ([place])  Disorientation        Note:  This document was prepared using Dragon voice recognition software and may include unintentional dictation errors       Delman Kitten, MD 08/26/18 2300

## 2018-08-26 NOTE — ED Notes (Signed)
Patient appears more calm at this time. Patient is leaving her mask on and speaking on cell phone.

## 2018-08-26 NOTE — ED Notes (Signed)
Patient is increasingly restless. Patient was ambulated to room commode with two person assist. Patient is able to call caregiver on cell phone and speak about plan of care. Patient answers simple questions appropriately, but appears confused. Patient's caregiver states patient had to put her dog down on Tuesday and that has caused her a great deal of grief. Patient was gagging per Olivia Mackie the caregiver on Zofran, so it was stopped yesterday and has only been given Phenergan PO since yesterday. Olivia Mackie states the nurse from the [facility] has been informed of the continued nausea. Patient has not been gagging here and has not been vomiting. Dr. Jacqualine Code aware.

## 2018-08-26 NOTE — ED Notes (Signed)
Patient is back from CT scan. CT tech states patient was restless during imaging.

## 2018-08-26 NOTE — ED Triage Notes (Signed)
Per EMS report, patient was diagnosed with Covid 5 days ago and had nausea then. Patient c/o nausea and 3 episodes of vomiting today after taking Zofran.Patient denies pain/shortness of breath.

## 2018-08-26 NOTE — ED Notes (Addendum)
Patient is moaning, requests phenergan. Dr. Jacqualine Code aware.

## 2018-08-26 NOTE — ED Notes (Signed)
Patient taken to CT scan.

## 2018-08-26 NOTE — ED Notes (Signed)
Patient denies relief of nausea with administration of phenergan.

## 2018-08-27 ENCOUNTER — Inpatient Hospital Stay (HOSPITAL_COMMUNITY)
Admission: AD | Admit: 2018-08-27 | Discharge: 2018-09-08 | DRG: 177 | Disposition: A | Discharge status: Home Health | Payer: Medicare Other | Source: Other Acute Inpatient Hospital | Attending: Internal Medicine | Admitting: Internal Medicine

## 2018-08-27 DIAGNOSIS — T380X5A Adverse effect of glucocorticoids and synthetic analogues, initial encounter: Secondary | ICD-10-CM | POA: Diagnosis not present

## 2018-08-27 DIAGNOSIS — R8271 Bacteriuria: Secondary | ICD-10-CM | POA: Diagnosis not present

## 2018-08-27 DIAGNOSIS — Z9884 Bariatric surgery status: Secondary | ICD-10-CM

## 2018-08-27 DIAGNOSIS — E7800 Pure hypercholesterolemia, unspecified: Secondary | ICD-10-CM | POA: Diagnosis present

## 2018-08-27 DIAGNOSIS — Y92239 Unspecified place in hospital as the place of occurrence of the external cause: Secondary | ICD-10-CM | POA: Diagnosis not present

## 2018-08-27 DIAGNOSIS — Z882 Allergy status to sulfonamides status: Secondary | ICD-10-CM

## 2018-08-27 DIAGNOSIS — J1289 Other viral pneumonia: Secondary | ICD-10-CM | POA: Diagnosis present

## 2018-08-27 DIAGNOSIS — E871 Hypo-osmolality and hyponatremia: Secondary | ICD-10-CM | POA: Diagnosis not present

## 2018-08-27 DIAGNOSIS — Z888 Allergy status to other drugs, medicaments and biological substances status: Secondary | ICD-10-CM

## 2018-08-27 DIAGNOSIS — I2510 Atherosclerotic heart disease of native coronary artery without angina pectoris: Secondary | ICD-10-CM | POA: Diagnosis present

## 2018-08-27 DIAGNOSIS — C9001 Multiple myeloma in remission: Secondary | ICD-10-CM | POA: Diagnosis present

## 2018-08-27 DIAGNOSIS — R627 Adult failure to thrive: Secondary | ICD-10-CM | POA: Diagnosis not present

## 2018-08-27 DIAGNOSIS — D509 Iron deficiency anemia, unspecified: Secondary | ICD-10-CM

## 2018-08-27 DIAGNOSIS — E1136 Type 2 diabetes mellitus with diabetic cataract: Secondary | ICD-10-CM | POA: Diagnosis present

## 2018-08-27 DIAGNOSIS — Z951 Presence of aortocoronary bypass graft: Secondary | ICD-10-CM

## 2018-08-27 DIAGNOSIS — Z7982 Long term (current) use of aspirin: Secondary | ICD-10-CM

## 2018-08-27 DIAGNOSIS — Z8673 Personal history of transient ischemic attack (TIA), and cerebral infarction without residual deficits: Secondary | ICD-10-CM

## 2018-08-27 DIAGNOSIS — E1151 Type 2 diabetes mellitus with diabetic peripheral angiopathy without gangrene: Secondary | ICD-10-CM | POA: Diagnosis not present

## 2018-08-27 DIAGNOSIS — Z880 Allergy status to penicillin: Secondary | ICD-10-CM

## 2018-08-27 DIAGNOSIS — I255 Ischemic cardiomyopathy: Secondary | ICD-10-CM | POA: Diagnosis not present

## 2018-08-27 DIAGNOSIS — Z90710 Acquired absence of both cervix and uterus: Secondary | ICD-10-CM

## 2018-08-27 DIAGNOSIS — Z9221 Personal history of antineoplastic chemotherapy: Secondary | ICD-10-CM

## 2018-08-27 DIAGNOSIS — U071 COVID-19: Principal | ICD-10-CM

## 2018-08-27 DIAGNOSIS — Z8249 Family history of ischemic heart disease and other diseases of the circulatory system: Secondary | ICD-10-CM

## 2018-08-27 DIAGNOSIS — I10 Essential (primary) hypertension: Secondary | ICD-10-CM | POA: Diagnosis not present

## 2018-08-27 DIAGNOSIS — Z6828 Body mass index (BMI) 28.0-28.9, adult: Secondary | ICD-10-CM | POA: Diagnosis not present

## 2018-08-27 DIAGNOSIS — Z79899 Other long term (current) drug therapy: Secondary | ICD-10-CM

## 2018-08-27 DIAGNOSIS — Z833 Family history of diabetes mellitus: Secondary | ICD-10-CM

## 2018-08-27 DIAGNOSIS — I5032 Chronic diastolic (congestive) heart failure: Secondary | ICD-10-CM | POA: Diagnosis not present

## 2018-08-27 DIAGNOSIS — I110 Hypertensive heart disease with heart failure: Secondary | ICD-10-CM | POA: Diagnosis not present

## 2018-08-27 DIAGNOSIS — Z7984 Long term (current) use of oral hypoglycemic drugs: Secondary | ICD-10-CM

## 2018-08-27 DIAGNOSIS — E861 Hypovolemia: Secondary | ICD-10-CM | POA: Diagnosis present

## 2018-08-27 DIAGNOSIS — Z885 Allergy status to narcotic agent status: Secondary | ICD-10-CM | POA: Diagnosis not present

## 2018-08-27 DIAGNOSIS — R110 Nausea: Secondary | ICD-10-CM | POA: Diagnosis present

## 2018-08-27 DIAGNOSIS — A0839 Other viral enteritis: Secondary | ICD-10-CM | POA: Diagnosis not present

## 2018-08-27 DIAGNOSIS — E878 Other disorders of electrolyte and fluid balance, not elsewhere classified: Secondary | ICD-10-CM | POA: Diagnosis not present

## 2018-08-27 DIAGNOSIS — Z955 Presence of coronary angioplasty implant and graft: Secondary | ICD-10-CM

## 2018-08-27 DIAGNOSIS — E1165 Type 2 diabetes mellitus with hyperglycemia: Secondary | ICD-10-CM | POA: Diagnosis not present

## 2018-08-27 DIAGNOSIS — I252 Old myocardial infarction: Secondary | ICD-10-CM

## 2018-08-27 DIAGNOSIS — G8929 Other chronic pain: Secondary | ICD-10-CM | POA: Diagnosis present

## 2018-08-27 DIAGNOSIS — Z7902 Long term (current) use of antithrombotics/antiplatelets: Secondary | ICD-10-CM

## 2018-08-27 DIAGNOSIS — I70229 Atherosclerosis of native arteries of extremities with rest pain, unspecified extremity: Secondary | ICD-10-CM | POA: Diagnosis present

## 2018-08-27 DIAGNOSIS — Z794 Long term (current) use of insulin: Secondary | ICD-10-CM

## 2018-08-27 DIAGNOSIS — E119 Type 2 diabetes mellitus without complications: Secondary | ICD-10-CM

## 2018-08-27 DIAGNOSIS — I998 Other disorder of circulatory system: Secondary | ICD-10-CM | POA: Diagnosis present

## 2018-08-27 DIAGNOSIS — B9689 Other specified bacterial agents as the cause of diseases classified elsewhere: Secondary | ICD-10-CM | POA: Diagnosis present

## 2018-08-27 LAB — C-REACTIVE PROTEIN: CRP: 10.8 mg/dL — ABNORMAL HIGH (ref <1.0)

## 2018-08-27 LAB — FIBRIN DERIVATIVES D-DIMER (ARMC ONLY): Fibrin derivatives D-dimer (ARMC): 994.22 ng/mL (FEU) — ABNORMAL HIGH (ref 0.00–499.00)

## 2018-08-27 LAB — HEMOGLOBIN A1C
Hgb A1c MFr Bld: 7.9 % — ABNORMAL HIGH (ref 4.8–5.6)
Mean Plasma Glucose: 180.03 mg/dL

## 2018-08-27 LAB — MAGNESIUM: Magnesium: 1.6 mg/dL — ABNORMAL LOW (ref 1.7–2.4)

## 2018-08-27 MED ORDER — PROMETHAZINE HCL 25 MG PO TABS
12.5000 mg | ORAL_TABLET | Freq: Four times a day (QID) | ORAL | Status: DC
  Administered 2018-08-27 – 2018-09-02 (×24): 12.5 mg via ORAL
  Filled 2018-08-27 (×23): qty 1

## 2018-08-27 MED ORDER — MAGNESIUM OXIDE 400 (241.3 MG) MG PO TABS
400.0000 mg | ORAL_TABLET | Freq: Every day | ORAL | Status: DC
  Administered 2018-08-27 – 2018-09-08 (×13): 400 mg via ORAL
  Filled 2018-08-27 (×19): qty 1

## 2018-08-27 MED ORDER — SODIUM CHLORIDE 0.9% FLUSH
10.0000 mL | Freq: Two times a day (BID) | INTRAVENOUS | Status: DC
  Administered 2018-08-27 – 2018-09-02 (×12): 10 mL
  Administered 2018-09-02: 30 mL
  Administered 2018-09-03 – 2018-09-07 (×10): 10 mL

## 2018-08-27 MED ORDER — POTASSIUM CHLORIDE CRYS ER 20 MEQ PO TBCR
20.0000 meq | EXTENDED_RELEASE_TABLET | Freq: Once | ORAL | Status: AC
  Administered 2018-08-27: 10:00:00 20 meq via ORAL
  Filled 2018-08-27: qty 1

## 2018-08-27 MED ORDER — ESCITALOPRAM OXALATE 10 MG PO TABS
10.0000 mg | ORAL_TABLET | Freq: Every day | ORAL | Status: DC
  Administered 2018-08-27 – 2018-09-08 (×13): 10 mg via ORAL
  Filled 2018-08-27 (×13): qty 1

## 2018-08-27 MED ORDER — SODIUM CHLORIDE 0.9% FLUSH: 10.0000 mL | INTRAVENOUS | Status: DC | PRN

## 2018-08-27 MED ORDER — MAGNESIUM SULFATE 2 GM/50ML IV SOLN
2.0000 g | Freq: Once | INTRAVENOUS | Status: AC
  Administered 2018-08-27: 2 g via INTRAVENOUS
  Filled 2018-08-27: qty 50

## 2018-08-27 MED ORDER — ISOSORBIDE MONONITRATE ER 30 MG PO TB24
30.0000 mg | ORAL_TABLET | Freq: Every day | ORAL | Status: DC
  Administered 2018-08-27 – 2018-09-08 (×13): 30 mg via ORAL
  Filled 2018-08-27 (×14): qty 1

## 2018-08-27 MED ORDER — PANTOPRAZOLE SODIUM 40 MG PO TBEC
40.0000 mg | DELAYED_RELEASE_TABLET | Freq: Two times a day (BID) | ORAL | Status: DC
  Administered 2018-08-27 – 2018-09-08 (×25): 40 mg via ORAL
  Filled 2018-08-27 (×26): qty 1

## 2018-08-27 MED ORDER — INSULIN GLARGINE 100 UNIT/ML ~~LOC~~ SOLN
5.0000 [IU] | Freq: Two times a day (BID) | SUBCUTANEOUS | Status: DC
  Administered 2018-08-27 – 2018-09-01 (×11): 5 [IU] via SUBCUTANEOUS
  Filled 2018-08-27 (×11): qty 0.05

## 2018-08-27 MED ORDER — FLUTICASONE PROPIONATE 50 MCG/ACT NA SUSP
2.0000 | Freq: Every day | NASAL | Status: DC | PRN
  Filled 2018-08-27: qty 16

## 2018-08-27 MED ORDER — SODIUM CHLORIDE 0.9 % IV SOLN
8.0000 mg | Freq: Four times a day (QID) | INTRAVENOUS | Status: DC | PRN
  Administered 2018-08-27: 8 mg via INTRAVENOUS
  Filled 2018-08-27: qty 4

## 2018-08-27 MED ORDER — CLOPIDOGREL BISULFATE 75 MG PO TABS
75.0000 mg | ORAL_TABLET | Freq: Every day | ORAL | Status: DC
  Administered 2018-08-27 – 2018-09-08 (×13): 75 mg via ORAL
  Filled 2018-08-27 (×13): qty 1

## 2018-08-27 MED ORDER — FOSFOMYCIN TROMETHAMINE 3 G PO PACK
3.0000 g | PACK | Freq: Once | ORAL | Status: AC
  Administered 2018-08-27: 3 g via ORAL
  Filled 2018-08-27: qty 3

## 2018-08-27 MED ORDER — POLYETHYLENE GLYCOL 3350 17 G PO PACK
17.0000 g | PACK | Freq: Two times a day (BID) | ORAL | Status: AC
  Administered 2018-08-27: 17 g via ORAL
  Filled 2018-08-27 (×2): qty 1

## 2018-08-27 MED ORDER — GABAPENTIN 300 MG PO CAPS
600.0000 mg | ORAL_CAPSULE | Freq: Two times a day (BID) | ORAL | Status: DC
  Administered 2018-08-27 – 2018-09-08 (×24): 600 mg via ORAL
  Filled 2018-08-27 (×25): qty 2

## 2018-08-27 MED ORDER — ONDANSETRON 4 MG PO TBDP
4.0000 mg | ORAL_TABLET | Freq: Three times a day (TID) | ORAL | Status: DC | PRN
  Administered 2018-08-31: 4 mg via ORAL
  Filled 2018-08-27: qty 1

## 2018-08-27 MED ORDER — HEPARIN SODIUM (PORCINE) 5000 UNIT/ML IJ SOLN
5000.0000 [IU] | Freq: Three times a day (TID) | INTRAMUSCULAR | Status: DC
  Administered 2018-08-27 – 2018-09-08 (×37): 5000 [IU] via SUBCUTANEOUS
  Filled 2018-08-27 (×36): qty 1

## 2018-08-27 MED ORDER — NITROGLYCERIN 0.4 MG SL SUBL: 0.4000 mg | SUBLINGUAL_TABLET | SUBLINGUAL | Status: DC | PRN

## 2018-08-27 MED ORDER — FENTANYL 50 MCG/HR TD PT72
1.0000 | MEDICATED_PATCH | TRANSDERMAL | Status: DC
  Administered 2018-08-27 – 2018-09-08 (×5): 1 via TRANSDERMAL
  Filled 2018-08-27 (×5): qty 1

## 2018-08-27 MED ORDER — PRALUENT 75 MG/ML ~~LOC~~ SOPN: 1.0000 "pen " | PEN_INJECTOR | SUBCUTANEOUS | Status: DC

## 2018-08-27 MED ORDER — ONDANSETRON HCL 4 MG PO TABS: 4.0000 mg | ORAL_TABLET | Freq: Four times a day (QID) | ORAL | Status: DC | PRN

## 2018-08-27 MED ORDER — DEXTROSE-NACL 5-0.45 % IV SOLN: INTRAVENOUS | Status: DC

## 2018-08-27 MED ORDER — SODIUM CHLORIDE 0.9 % IV SOLN
Freq: Once | INTRAVENOUS | Status: AC
  Administered 2018-08-27: 02:00:00 via INTRAVENOUS

## 2018-08-27 MED ORDER — ONDANSETRON HCL 4 MG/2ML IJ SOLN
4.0000 mg | Freq: Once | INTRAMUSCULAR | Status: AC
  Administered 2018-08-27: 05:00:00 4 mg via INTRAVENOUS
  Filled 2018-08-27: qty 2

## 2018-08-27 MED ORDER — SODIUM CHLORIDE 0.9 % IV SOLN
INTRAVENOUS | Status: AC
  Administered 2018-08-27 (×2): via INTRAVENOUS

## 2018-08-27 MED ORDER — HYDROMORPHONE HCL 2 MG PO TABS: 2.0000 mg | ORAL_TABLET | ORAL | Status: DC | PRN

## 2018-08-27 MED ORDER — ONDANSETRON HCL 4 MG/2ML IJ SOLN: 4.0000 mg | Freq: Four times a day (QID) | INTRAMUSCULAR | Status: DC | PRN

## 2018-08-27 MED ORDER — ACETAMINOPHEN 325 MG PO TABS
650.0000 mg | ORAL_TABLET | Freq: Four times a day (QID) | ORAL | Status: DC | PRN
  Administered 2018-08-27 – 2018-08-30 (×4): 650 mg via ORAL
  Filled 2018-08-27 (×4): qty 2

## 2018-08-27 MED ORDER — ASPIRIN 81 MG PO CHEW
81.0000 mg | CHEWABLE_TABLET | Freq: Every day | ORAL | Status: DC
  Administered 2018-08-27 – 2018-09-08 (×13): 81 mg via ORAL
  Filled 2018-08-27 (×13): qty 1

## 2018-08-27 MED ORDER — METOPROLOL SUCCINATE ER 25 MG PO TB24
25.0000 mg | ORAL_TABLET | Freq: Every day | ORAL | Status: DC
  Administered 2018-08-27 – 2018-09-03 (×8): 25 mg via ORAL
  Filled 2018-08-27 (×8): qty 1

## 2018-08-27 NOTE — Progress Notes (Addendum)
1246 Third watery stool notified MD Olevia Bowens.  1329 no new orders

## 2018-08-27 NOTE — ED Provider Notes (Signed)
Patient initially agreed to go to [facility] but then refused I spoke with her son her son does not want her to come home because she has been here twice in the last 2 days and she is not eating or drinking.  I relayed this information to her and discussed with her a little bit more than need to stay in the hospital and she will now stay in the hospital.  She understands we can get her to [facility] for some IV fluids and possibly, possibly go home tomorrow.   Nena Polio, MD 08/27/18 [phone]

## 2018-08-27 NOTE — ED Notes (Signed)
Pt easily awakened; noted to be incontinent of liquid brown stool; cleaned well; pt fine with attends in place at this time

## 2018-08-27 NOTE — ED Notes (Signed)
Eyes closed, resp even and unlabored; pt awaiting transport to [facility]

## 2018-08-27 NOTE — ED Notes (Signed)
Patient states she does not want to go to [facility]. Dr. Cinda Quest aware.

## 2018-08-27 NOTE — Progress Notes (Signed)
Pt lost IV access at 1730. Three nurse attempted IV. Consulted IV team and notified MD Olevia Bowens via epic page.

## 2018-08-27 NOTE — ED Notes (Signed)
Report given to [facility]. 

## 2018-08-27 NOTE — ED Notes (Signed)
Patient appears more calm, asked for another blanket. NAD. Drank antibiotic without difficulty.

## 2018-08-27 NOTE — ED Notes (Signed)
Patient given warm blanket, room darkened for comfort.

## 2018-08-27 NOTE — H&P (Signed)
History and Physical    [name] EHM:[id number] DOB: [birth year] DOA: 08/27/2018  PCP: Lynnell Jude, MD  Patient coming from:[facility]  Chief Complaint: Emesis  HPI: [name] is a 78 y.o. female with medical history significant of past medical history diastolic heart failure coronary artery disease, with a recent recent positive SARS-CoV-2 on 08/17/2018, she has been experiencing fever, her nausea has not improved with Zofran and Phenergan.  Has not been able to eat anything for the last 2 days due to the severe nausea she denies any vomiting.  She relates she feels significantly tired and weak and anorexic.  ED Course:  On to be febrile with a temperature of 100.4 other vital signs were stable.  To be hyponatremic hypochloremic with a creatinine of 1.3 (baseline creatinine is around 1.1) UA was positive for nitrates with pseudo-5 white blood cells.  Abdominal x-ray and checks x-ray as below.  Review of Systems: As per HPI otherwise 10 point review of systems negative.    Past Medical History:  Diagnosis Date   Anginal pain ([place])    Anxiety    Arthritis    "shoulders primarily" (01/13/2016)   CAD (coronary artery disease)    s/p CABG in Oct 2017   CHF (congestive heart failure) (HCC)    Chronic pain    from her multiple myeloma but says that her pain is usually in her legs/notes 01/13/2016   High cholesterol    History of blood transfusion    "numerous; related to procedures for my heart" (01/13/2016)   Hypertension    Ischemic cardiomyopathy    /nots 01/13/2016   MI (myocardial infarction) ([place])    EKG on arrival 01/13/2016 showed NSR with evidence of old anterior and inferior infarcts   MI (myocardial infarction) ([place]) 08/2015   Multiple myeloma ([place]) dx'd 2015   off chemo since 2016   Nausea and vomiting 05/10/2016   3-4 month hx of nausea and vomiting   PAD (peripheral artery disease) ([place])    Stroke ([place])    "several in 1 year; ? year" (01/13/2016)     Type II diabetes mellitus ([place])     Past Surgical History:  Procedure Laterality Date   CARDIAC CATHETERIZATION N/A 01/14/2016   Procedure: Left Heart Cath and Cors/Grafts Angiography;  Surgeon: Burnell Blanks, MD;  Location: [facility];  Service: Cardiovascular;  Laterality: N/A;   CARPAL TUNNEL RELEASE Left    CORONARY ANGIOPLASTY WITH STENT PLACEMENT  05/2015   DES placed to the mid and distal LAD as well as OM2/notes 01/13/2016   CORONARY ANGIOPLASTY WITH STENT PLACEMENT  ~ 2010   CORONARY ARTERY BYPASS GRAFT  11/11/2015   CABG X2 LIMA-LAD and SVG-OM/notes 01/13/2016   DILATION AND CURETTAGE OF UTERUS     FEMORAL-POPLITEAL BYPASS GRAFT Left 2013   /notes 01/13/2016   LAPAROSCOPIC GASTRIC BANDING  2007   RENAL ARTERY STENT     VAGINAL HYSTERECTOMY       reports that she has never smoked. She has never used smokeless tobacco. She reports current alcohol use. She reports that she does not use drugs.  Allergies  Allergen Reactions   Other Swelling    Smoke--Head swelling   Statins Swelling   Metoclopramide Other (See Comments)    Causes tremors and insomnia   Amoxicillin    Codeine    Sulfa Antibiotics    Duloxetine Nausea Only    Family History  Problem Relation Age of Onset  Diabetes Mother    Heart failure Mother    Prostate cancer Neg Hx    Kidney cancer Neg Hx    Bladder Cancer Neg Hx    Colon cancer Neg Hx    Stomach cancer Neg Hx     Prior to Admission medications   Medication Sig Start Date End Date Taking? Authorizing Provider  Alirocumab (PRALUENT) 75 MG/ML SOPN Inject 1 pen into the skin every 14 (fourteen) days. STUDY MEDICATION 05/06/16   Dorothy Spark, MD  aspirin 81 MG tablet Take 81 mg by mouth daily.    [provider]  Cholecalciferol (VITAMIN D3) 50000 units CAPS Take 1 capsule by mouth 2 (two) times a week. Sunday AND Wednesday    [provider]  clopidogrel (PLAVIX) 75 MG tablet   04/13/17   [provider]  diclofenac sodium (VOLTAREN) 1 % GEL Apply 2 g topically daily as needed for pain.    [provider]  escitalopram (LEXAPRO) 10 MG tablet Take 10 mg by mouth daily.    [provider]  famotidine (PEPCID) 20 MG tablet Take 1 tablet (20 mg total) by mouth daily. 08/23/18 08/23/19  Merlyn Lot, MD  feeding supplement, ENSURE ENLIVE, (ENSURE ENLIVE) LIQD Take 237 mLs by mouth 2 (two) times daily between meals. 10/06/16   Geradine Girt, DO  fentaNYL (DURAGESIC - DOSED MCG/HR) 50 MCG/HR Place 50 mcg onto the skin every 3 (three) days.     [provider]  fluticasone (FLONASE) 50 MCG/ACT nasal spray Place 2 sprays into both nostrils daily as needed for allergies.  03/08/16   [provider]  furosemide (LASIX) 20 MG tablet Take 1 tablet (20 mg total) by mouth daily as needed for fluid or edema. 05/10/16 01/11/18  Dorothy Spark, MD  gabapentin (NEURONTIN) 300 MG capsule Take 300-600 mg by mouth See admin instructions. Pt takes 639m in am, 6049mat bedtime    [provider]  hydrocortisone cream 1 % Apply topically as needed for itching. 01/15/16   SmArbutus LeasNP  HYDROmorphone (DILAUDID) 2 MG tablet Take 2 mg by mouth as needed. 11/17/17   [provider]  Insulin Glargine (LANTUS SOLOSTAR) 100 UNIT/ML Solostar Pen Inject 18 Units into the skin daily. 06/16/17   [provider]  isosorbide mononitrate (IMDUR) 60 MG 24 hr tablet Take 1 tablet (60 mg total) by mouth daily. Please make yearly appt with Dr. NeMeda Coffeeor September before anymore refills. 1st attempt 07/25/17   NeDorothy SparkMD  lisinopril (PRINIVIL,ZESTRIL) 10 MG tablet Take 10 mg by mouth daily.    [provider]  lisinopril (PRINIVIL,ZESTRIL) 5 MG tablet Take 1 tablet (5 mg total) by mouth daily. 01/16/16   SmArbutus LeasNP  magnesium oxide (MAG-OX) 400 MG tablet Take 400 mg by mouth daily.    [provider]   metFORMIN (GLUCOPHAGE) 500 MG tablet Take 500 mg by mouth 2 (two) times daily with a meal.     [provider]  metoprolol succinate (TOPROL-XL) 25 MG 24 hr tablet Take by mouth.    [provider]  metoprolol succinate (TOPROL-XL) 25 MG 24 hr tablet Take 12.5 mg by mouth 2 (two) times daily.    [provider]  metoprolol tartrate (LOPRESSOR) 25 MG tablet Take 0.5 tablets (12.5 mg total) by mouth 2 (two) times daily. 02/23/16 09/17/16  NeDorothy SparkMD  nitroGLYCERIN (NITROSTAT) 0.4 MG SL tablet Place 1 tablet (0.4 mg  total) under the tongue every 5 (five) minutes as needed for chest pain. 01/15/16   Arbutus Leas, NP  Omega-3 Fatty Acids (FISH OIL) 1000 MG CAPS Take 2 capsules (2,000 mg total) by mouth daily. Patient not taking: Reported on 01/11/2018 05/06/16   Dorothy Spark, MD  ondansetron (ZOFRAN ODT) 4 MG disintegrating tablet Take 1 tablet (4 mg total) by mouth every 8 (eight) hours as needed for nausea or vomiting. 08/23/18   Merlyn Lot, MD  pantoprazole (PROTONIX) 40 MG tablet Take 1 tablet (40 mg total) by mouth daily at 6 (six) AM. 10/07/16   Geradine Girt, DO  promethazine (PHENERGAN) 12.5 MG tablet Take 1 tablet (12.5 mg total) by mouth every 6 (six) hours. 01/11/18   Schuyler Amor, MD  Vitamin D, Ergocalciferol, (DRISDOL) 50000 units CAPS capsule  05/06/17   [provider]    Physical Exam: Vitals:   08/27/18 0638 08/27/18 0759 08/27/18 1007  BP: (!) 142/50 (!) 141/53 (!) 146/56  Pulse: 84  69  Resp: 17    Temp: 99.5 F (37.5 C) 98.1 F (36.7 C)   TempSrc: Oral Oral   SpO2: 98%      Constitutional: NAD, calm, comfortable Vitals:   08/27/18 0638 08/27/18 0759 08/27/18 1007  BP: (!) 142/50 (!) 141/53 (!) 146/56  Pulse: 84  69  Resp: 17    Temp: 99.5 F (37.5 C) 98.1 F (36.7 C)   TempSrc: Oral Oral   SpO2: 98%     Eyes: PERRL, lids and conjunctivae normal ENMT: Mucous membranes are moist. Posterior pharynx clear of  any exudate or lesions.Normal dentition.  Neck: normal, supple, no masses, no thyromegaly Respiratory: Good air movement with crackles bilaterally. Cardiovascular: Regular rate and rhythm, no murmurs / rubs / gallops. No extremity edema. 2+ pedal pulses. No carotid bruits.  Abdomen: no tenderness, no masses palpated. No hepatosplenomegaly. Bowel sounds positive.  Musculoskeletal: no clubbing / cyanosis. No joint deformity upper and lower extremities. Good ROM, no contractures. Normal muscle tone.  Skin: no rashes, lesions, ulcers. No induration Neurologic: CN 2-12 grossly intact. Sensation intact, DTR normal. Strength 5/5 in all 4.  Psychiatric: Normal judgment and insight. Alert and oriented x 3. Normal mood.   Labs on Admission: I have personally reviewed following labs and imaging studies  CBC: Recent Labs  Lab 08/23/18 0804 08/26/18 2006  WBC 8.0 8.0  NEUTROABS 4.5  --   HGB 10.8* 11.4*  HCT 34.6* 35.7*  MCV 78.5* 76.9*  PLT 184 250   Basic Metabolic Panel: Recent Labs  Lab 08/23/18 0804 08/26/18 2006  NA 133* 130*  K 4.2 3.9  CL 97* 94*  CO2 22 22  GLUCOSE 170* 128*  BUN 20 28*  CREATININE 1.38* 1.36*  CALCIUM 9.2 8.9   GFR: Estimated Creatinine Clearance: 31.3 mL/min (A) (by C-G formula based on SCr of 1.36 mg/dL (H)). Liver Function Tests: Recent Labs  Lab 08/23/18 0804 08/26/18 2006  AST 18 28  ALT 8 11  ALKPHOS 73 65  BILITOT 0.7 0.8  PROT 6.9 7.4  ALBUMIN 3.3* 3.3*   Recent Labs  Lab 08/23/18 1025 08/26/18 2006  LIPASE 24 27   No results for input(s): AMMONIA in the last 168 hours. Coagulation Profile: No results for input(s): INR, PROTIME in the last 168 hours. Cardiac Enzymes: No results for input(s): CKTOTAL, CKMB, CKMBINDEX, TROPONINI in the last 168 hours. BNP (last 3 results) No results for input(s): PROBNP in the last 8760 hours.  HbA1C: No results for input(s): HGBA1C in the last 72 hours. CBG: Recent Labs  Lab 08/26/18 2304   GLUCAP 128*   Lipid Profile: No results for input(s): CHOL, HDL, LDLCALC, TRIG, CHOLHDL, LDLDIRECT in the last 72 hours. Thyroid Function Tests: No results for input(s): TSH, T4TOTAL, FREET4, T3FREE, THYROIDAB in the last 72 hours. Anemia Panel: No results for input(s): VITAMINB12, FOLATE, FERRITIN, TIBC, IRON, RETICCTPCT in the last 72 hours. Urine analysis:    Component Value Date/Time   COLORURINE YELLOW (A) 08/26/2018 2302   APPEARANCEUR HAZY (A) 08/26/2018 2302   APPEARANCEUR Clear 06/02/2017 1026   LABSPEC 1.011 08/26/2018 2302   PHURINE 5.0 08/26/2018 2302   GLUCOSEU NEGATIVE 08/26/2018 2302   HGBUR NEGATIVE 08/26/2018 2302   BILIRUBINUR NEGATIVE 08/26/2018 2302   BILIRUBINUR Negative 06/02/2017 1026   KETONESUR 5 (A) 08/26/2018 2302   PROTEINUR NEGATIVE 08/26/2018 2302   NITRITE POSITIVE (A) 08/26/2018 2302   LEUKOCYTESUR NEGATIVE 08/26/2018 2302    Radiological Exams on Admission: Ct Head Wo Contrast  Result Date: 08/26/2018 CLINICAL DATA:  Altered mental status. Nausea.  COVID-19 positive EXAM: CT HEAD WITHOUT CONTRAST TECHNIQUE: Contiguous axial images were obtained from the base of the skull through the vertex without intravenous contrast. COMPARISON:  Head CT 10/06/2016 FINDINGS: Brain: Patient had difficulty tolerating the exam, mild motion artifact. No intracranial hemorrhage, mass effect, or midline shift. No hydrocephalus. The basilar cisterns are patent. No evidence of territorial infarct or acute ischemia. No extra-axial or intracranial fluid collection. Unchanged degree of generalized atrophy. Mild chronic small vessel ischemia. Vascular: Atherosclerosis of skullbase vasculature without hyperdense vessel or abnormal calcification. Skull: No fracture or focal lesion. Sinuses/Orbits: Mucosal thickening of ethmoid air cells. No fluid levels. Mastoid air cells are clear. Bilateral cataract resection. Other: None. IMPRESSION: 1. No acute intracranial abnormality. 2.  Unchanged atrophy and chronic small vessel ischemia. Electronically Signed   By: Keith Rake M.D.   On: 08/26/2018 23:39   Dg Chest Portable 1 View  Result Date: 08/26/2018 CLINICAL DATA:  Nausea. Poor p.o. intake. Vomiting. Diagnosed with COVID-19 5 days ago. EXAM: PORTABLE CHEST 1 VIEW COMPARISON:  08/23/2018. FINDINGS: Normal sized heart. Mildly tortuous and calcified thoracic aorta. Stable mild diffuse peribronchial thickening and accentuation of the interstitial markings. Interval minimal patchy opacity in the lateral aspect of the right mid upper lung zone. Mild linear densities in both lungs with mild progression. Stable post CABG changes, lower thoracic spine degenerative changes and diffuse osteopenia. A stat after a IMPRESSION: 1. Interval minimal patchy opacity in right mid upper lung zone laterally. This may represent early lung changes due to the patient's COVID-19. 2. Mildly progressive linear atelectasis in both lungs. 3. Stable mild chronic bronchitic changes. Electronically Signed   By: Claudie Revering M.D.   On: 08/26/2018 21:49   Dg Abd 2 Views  Result Date: 08/26/2018 CLINICAL DATA:  Nausea. Poor oral intake. Diagnosed with COVID-19 5 days ago. Three episodes of vomiting today. EXAM: ABDOMEN - 2 VIEW COMPARISON:  10/04/2016. FINDINGS: Normal bowel gas pattern. No significant stool. Stable gastric band. Interval 4 mm left mid abdominal calcification. Stable thoracolumbar scoliosis and degenerative changes. Atheromatous arterial calcifications without visible aneurysm. Post CABG changes. Bilateral proximal thigh vascular stents. IMPRESSION: 1. No acute abnormality. 2. Interval 4 mm left mid abdominal calcification. This could represent a phlebolith or urinary tract calcification. Electronically Signed   By: Claudie Revering M.D.   On: 08/26/2018 21:45    EKG: Independently reviewed.  Sinus rhythm normal  axis nonspecific T wave changes  Assessment/Plan Gastroenteritis due to COVID-19  virus/intractable nausea and acute diarrhea: Her saturations have remained greater than 98% on room air. We will start her on IV fluid hydration her abdominal x-ray shows no acute findings, her abdominal exam is benign. LFTs are not impressive her alkaline phosphatase is 65. We will treat her conservatively with fluids Zofran for nausea and narcotics for pain. We will just change her fentanyl patch. Clear liquid diet.  New hypovolemic hyponatremia: In the setting of severe nausea and acute diarrhea. We will start her on normal saline recheck a basic metabolic panel in the morning.  Essential hypertension: We will hold all antihypertensive medications at this point. Her blood pressure is fairly controlled.  Uncontrolled type 2 diabetes mellitus (HCC) Last hemoglobin A1c of 7.8 back in 2018, will start on sliding scale insulin will hold all oral hypoglycemic agents.  History of critical ischemia of lower extremity Currently asymptomatic continue Plavix.  Microcytic anemia: No signs of overt bleeding, will need further work-up as an outpatient.  Hypomagnesemia: Replete orally and will put on schedule oral magnesium.  DVT prophylaxis: lovenox Code Status:  Full Family Communication: none Disposition Plan:  Consults called: none Admission status: Inpatient   Charlynne Cousins MD   If 7PM-7AM, please contact night-coverage [email or web address] Password [email or web address]  08/27/2018, 11:01 AM

## 2018-08-27 NOTE — ED Notes (Signed)
Patient refused to wear brief or Purewick. Chux are underneath the patient.

## 2018-08-27 NOTE — ED Notes (Signed)
One unsuccessful attempt at blood draw. Patient declined further blood draw by this RN. Lav informed.

## 2018-08-27 NOTE — ED Notes (Signed)
Report given to Dorthula Rue, RN

## 2018-08-27 NOTE — Progress Notes (Signed)
Notified MD ortiz patient is on unit

## 2018-08-27 NOTE — ED Notes (Signed)
Patient's son, Jayonna Meyering, contacted at [phone] to make sure he was available for when the MD was through with an emergency.

## 2018-08-28 ENCOUNTER — Encounter (HOSPITAL_COMMUNITY): Payer: Self-pay

## 2018-08-28 ENCOUNTER — Other Ambulatory Visit: Payer: Self-pay

## 2018-08-28 DIAGNOSIS — I110 Hypertensive heart disease with heart failure: Secondary | ICD-10-CM | POA: Diagnosis present

## 2018-08-28 DIAGNOSIS — I10 Essential (primary) hypertension: Secondary | ICD-10-CM | POA: Diagnosis not present

## 2018-08-28 DIAGNOSIS — E119 Type 2 diabetes mellitus without complications: Secondary | ICD-10-CM | POA: Diagnosis not present

## 2018-08-28 DIAGNOSIS — E878 Other disorders of electrolyte and fluid balance, not elsewhere classified: Secondary | ICD-10-CM | POA: Diagnosis present

## 2018-08-28 DIAGNOSIS — Z882 Allergy status to sulfonamides status: Secondary | ICD-10-CM | POA: Diagnosis not present

## 2018-08-28 DIAGNOSIS — Z888 Allergy status to other drugs, medicaments and biological substances status: Secondary | ICD-10-CM | POA: Diagnosis not present

## 2018-08-28 DIAGNOSIS — J1289 Other viral pneumonia: Secondary | ICD-10-CM | POA: Diagnosis present

## 2018-08-28 DIAGNOSIS — E1165 Type 2 diabetes mellitus with hyperglycemia: Secondary | ICD-10-CM | POA: Diagnosis not present

## 2018-08-28 DIAGNOSIS — U071 COVID-19: Secondary | ICD-10-CM | POA: Diagnosis present

## 2018-08-28 DIAGNOSIS — Z6828 Body mass index (BMI) 28.0-28.9, adult: Secondary | ICD-10-CM | POA: Diagnosis not present

## 2018-08-28 DIAGNOSIS — D509 Iron deficiency anemia, unspecified: Secondary | ICD-10-CM | POA: Diagnosis present

## 2018-08-28 DIAGNOSIS — E1136 Type 2 diabetes mellitus with diabetic cataract: Secondary | ICD-10-CM | POA: Diagnosis present

## 2018-08-28 DIAGNOSIS — I255 Ischemic cardiomyopathy: Secondary | ICD-10-CM | POA: Diagnosis present

## 2018-08-28 DIAGNOSIS — C9001 Multiple myeloma in remission: Secondary | ICD-10-CM | POA: Diagnosis present

## 2018-08-28 DIAGNOSIS — R627 Adult failure to thrive: Secondary | ICD-10-CM | POA: Diagnosis present

## 2018-08-28 DIAGNOSIS — I252 Old myocardial infarction: Secondary | ICD-10-CM | POA: Diagnosis not present

## 2018-08-28 DIAGNOSIS — I2510 Atherosclerotic heart disease of native coronary artery without angina pectoris: Secondary | ICD-10-CM | POA: Diagnosis present

## 2018-08-28 DIAGNOSIS — Z880 Allergy status to penicillin: Secondary | ICD-10-CM | POA: Diagnosis not present

## 2018-08-28 DIAGNOSIS — Z9884 Bariatric surgery status: Secondary | ICD-10-CM | POA: Diagnosis not present

## 2018-08-28 DIAGNOSIS — A0839 Other viral enteritis: Secondary | ICD-10-CM | POA: Diagnosis present

## 2018-08-28 DIAGNOSIS — Z885 Allergy status to narcotic agent status: Secondary | ICD-10-CM | POA: Diagnosis not present

## 2018-08-28 DIAGNOSIS — Z951 Presence of aortocoronary bypass graft: Secondary | ICD-10-CM | POA: Diagnosis not present

## 2018-08-28 DIAGNOSIS — E871 Hypo-osmolality and hyponatremia: Secondary | ICD-10-CM | POA: Diagnosis present

## 2018-08-28 DIAGNOSIS — I5032 Chronic diastolic (congestive) heart failure: Secondary | ICD-10-CM | POA: Diagnosis present

## 2018-08-28 DIAGNOSIS — R8271 Bacteriuria: Secondary | ICD-10-CM | POA: Diagnosis present

## 2018-08-28 DIAGNOSIS — Y92239 Unspecified place in hospital as the place of occurrence of the external cause: Secondary | ICD-10-CM | POA: Diagnosis not present

## 2018-08-28 DIAGNOSIS — E1151 Type 2 diabetes mellitus with diabetic peripheral angiopathy without gangrene: Secondary | ICD-10-CM | POA: Diagnosis present

## 2018-08-28 LAB — CBC
HCT: 32.5 % — ABNORMAL LOW (ref 36.0–46.0)
Hemoglobin: 10.2 g/dL — ABNORMAL LOW (ref 12.0–15.0)
MCH: 25.1 pg — ABNORMAL LOW (ref 26.0–34.0)
MCHC: 31.4 g/dL (ref 30.0–36.0)
MCV: 80 fL (ref 80.0–100.0)
Platelets: 226 10*3/uL (ref 150–400)
RBC: 4.06 MIL/uL (ref 3.87–5.11)
RDW: 14.7 % (ref 11.5–15.5)
WBC: 7.5 10*3/uL (ref 4.0–10.5)
nRBC: 0 % (ref 0.0–0.2)

## 2018-08-28 LAB — COMPREHENSIVE METABOLIC PANEL
ALT: 10 U/L (ref 0–44)
AST: 21 U/L (ref 15–41)
Albumin: 2.8 g/dL — ABNORMAL LOW (ref 3.5–5.0)
Alkaline Phosphatase: 52 U/L (ref 38–126)
Anion gap: 8 (ref 5–15)
BUN: 21 mg/dL (ref 8–23)
CO2: 24 mmol/L (ref 22–32)
Calcium: 8.6 mg/dL — ABNORMAL LOW (ref 8.9–10.3)
Chloride: 103 mmol/L (ref 98–111)
Creatinine, Ser: 1.18 mg/dL — ABNORMAL HIGH (ref 0.44–1.00)
GFR calc Af Amer: 52 mL/min — ABNORMAL LOW (ref >60)
GFR calc non Af Amer: 44 mL/min — ABNORMAL LOW (ref >60)
Glucose, Bld: 92 mg/dL (ref 70–99)
Potassium: 3.6 mmol/L (ref 3.5–5.1)
Sodium: 135 mmol/L (ref 135–145)
Total Bilirubin: 0.6 mg/dL (ref 0.3–1.2)
Total Protein: 6.5 g/dL (ref 6.5–8.1)

## 2018-08-28 LAB — C-REACTIVE PROTEIN: CRP: 13.4 mg/dL — ABNORMAL HIGH (ref <1.0)

## 2018-08-28 LAB — D-DIMER, QUANTITATIVE: D-Dimer, Quant: 1.42 ug/mL-FEU — ABNORMAL HIGH (ref 0.00–0.50)

## 2018-08-28 LAB — FERRITIN: Ferritin: 99 ng/mL (ref 11–307)

## 2018-08-28 LAB — LACTATE DEHYDROGENASE: LDH: 165 U/L (ref 98–192)

## 2018-08-28 MED ORDER — SODIUM CHLORIDE 0.9 % IV SOLN
INTRAVENOUS | Status: AC
  Administered 2018-08-28: 10:00:00 via INTRAVENOUS

## 2018-08-28 NOTE — Progress Notes (Signed)
1000 IV fluids order expired. Notified MD Olevia Bowens to se if he wants to continue fluids. Verbal to continue fluids for 2 hours then stop.

## 2018-08-28 NOTE — Progress Notes (Addendum)
[facility] PROGRESS NOTE    Progress Note  [name]  WGN:[id number] DOB: 04/30/1940 DOA: 08/27/2018 PCP: Lynnell Jude, MD     Brief Narrative:   [name] is an 78 y.o. female past medical history significant for chronic diastolic heart failure, coronary artery disease, with a recent positive SARS-CoV-2 PCR on 07/19/4694 comes into [facility] ED for fever nausea not improved with Zofran or Phenergan and diarrhea.  Assessment/Plan:   Acute gastroenteritis due to COVID-19 viral infection intractable nausea with acute diarrhea: Her saturations have remained stable satting greater than 95% on room air. Her inflammatory markers are pending. She has no leukocytosis she has remained afebrile, her abdominal x-ray does not show any acute findings, LFT's are not impressive her alkaline phosphatase 65. She relates she feels slightly better today her nausea has improved, she relates she does not think she could tolerate an advancement in her diet will continue on a liquid diet. Continue IV fluids for today.  New hypovolemic hyponatremia: In the setting of severe nausea and acute diarrhea, resolved with IV fluid hydration.  Essential hypertension: Continue to hold antihypertensive medication blood pressure is controlled.  Uncontrolled diabetes mellitus type 2: With a last A1c of 7.8 back in 2018, continue sliding scale insulin. Continue to hold oral hypoglycemic agents.  History of critical limb ischemia of the lower extremities: She is currently asymptomatic continue Plavix.  Microcytic anemia: No signs of overt bleeding will need further work-up as an outpatient.  HypoMagnesium: Was repleted orally now resolved.  DVT prophylaxis: lovenox Family Communication:Son Disposition Plan/Barrier to D/C: once diarrhea and nausea resolved. Code Status:     Code Status Orders  (From admission, onward)         Start     Ordered   08/27/18 0756  Full code  Continuous      08/27/18 0757        Code Status History    Date Active Date Inactive Code Status Order ID Comments User Context   10/04/2016 1952 10/06/2016 2220 Full Code [id number]  Geradine Girt, DO ED   09/18/2016 1535 09/21/2016 1727 Full Code [id number]  Gladstone Lighter, MD Inpatient   01/13/2016 1416 01/15/2016 1915 Full Code [id number]  Arbutus Leas, NP Inpatient   Advance Care Planning Activity        IV Access:    Peripheral IV   Procedures and diagnostic studies:   Ct Head Wo Contrast  Result Date: 08/26/2018 CLINICAL DATA:  Altered mental status. Nausea.  COVID-19 positive EXAM: CT HEAD WITHOUT CONTRAST TECHNIQUE: Contiguous axial images were obtained from the base of the skull through the vertex without intravenous contrast. COMPARISON:  Head CT 10/06/2016 FINDINGS: Brain: Patient had difficulty tolerating the exam, mild motion artifact. No intracranial hemorrhage, mass effect, or midline shift. No hydrocephalus. The basilar cisterns are patent. No evidence of territorial infarct or acute ischemia. No extra-axial or intracranial fluid collection. Unchanged degree of generalized atrophy. Mild chronic small vessel ischemia. Vascular: Atherosclerosis of skullbase vasculature without hyperdense vessel or abnormal calcification. Skull: No fracture or focal lesion. Sinuses/Orbits: Mucosal thickening of ethmoid air cells. No fluid levels. Mastoid air cells are clear. Bilateral cataract resection. Other: None. IMPRESSION: 1. No acute intracranial abnormality. 2. Unchanged atrophy and chronic small vessel ischemia. Electronically Signed   By: Keith Rake M.D.   On: 08/26/2018 23:39   Dg Chest Portable 1 View  Result Date: 08/26/2018 CLINICAL DATA:  Nausea. Poor p.o. intake. Vomiting. Diagnosed with COVID-19 5  days ago. EXAM: PORTABLE CHEST 1 VIEW COMPARISON:  08/23/2018. FINDINGS: Normal sized heart. Mildly tortuous and calcified thoracic aorta. Stable mild diffuse peribronchial thickening and  accentuation of the interstitial markings. Interval minimal patchy opacity in the lateral aspect of the right mid upper lung zone. Mild linear densities in both lungs with mild progression. Stable post CABG changes, lower thoracic spine degenerative changes and diffuse osteopenia. A stat after a IMPRESSION: 1. Interval minimal patchy opacity in right mid upper lung zone laterally. This may represent early lung changes due to the patient's COVID-19. 2. Mildly progressive linear atelectasis in both lungs. 3. Stable mild chronic bronchitic changes. Electronically Signed   By: Claudie Revering M.D.   On: 08/26/2018 21:49   Dg Abd 2 Views  Result Date: 08/26/2018 CLINICAL DATA:  Nausea. Poor oral intake. Diagnosed with COVID-19 5 days ago. Three episodes of vomiting today. EXAM: ABDOMEN - 2 VIEW COMPARISON:  10/04/2016. FINDINGS: Normal bowel gas pattern. No significant stool. Stable gastric band. Interval 4 mm left mid abdominal calcification. Stable thoracolumbar scoliosis and degenerative changes. Atheromatous arterial calcifications without visible aneurysm. Post CABG changes. Bilateral proximal thigh vascular stents. IMPRESSION: 1. No acute abnormality. 2. Interval 4 mm left mid abdominal calcification. This could represent a phlebolith or urinary tract calcification. Electronically Signed   By: Claudie Revering M.D.   On: 08/26/2018 21:45     Medical Consultants:    None.  Anti-Infectives:   None  Subjective:    [name] relates she feels slightly better today her nausea is improved she continues to have persistent diarrhea  Objective:    Vitals:   08/27/18 2100 08/27/18 2154 08/28/18 0440 08/28/18 0734  BP:  (!) 137/53 (!) 132/42 (!) 143/50  Pulse:  68    Resp:   16 16  Temp: 100.1 F (37.8 C)  98.1 F (36.7 C) 98.3 F (36.8 C)  TempSrc: Oral  Axillary Axillary  SpO2:  95%     SpO2: 95 %   Intake/Output Summary (Last 24 hours) at 08/28/2018 0804 Last data filed at  08/28/2018 0700 Gross per 24 hour  Intake 849.24 ml  Output -  Net 849.24 ml   There were no vitals filed for this visit.  Exam: General exam: In no acute distress. Respiratory system: Good air movement and clear to auscultation. Cardiovascular system: S1 & S2 heard, RRR. No JVD. Gastrointestinal system: Positive bowel sounds soft nontender nondistended. Central nervous system: Alert and oriented. No focal neurological deficits. Extremities: No pedal edema. Skin: No rashes, lesions or ulcers Psychiatry: Judgement and insight appear normal. Mood & affect appropriate.    Data Reviewed:    Labs: Basic Metabolic Panel: Recent Labs  Lab 08/23/18 0804 08/26/18 2006 08/27/18 0930 08/28/18 0610  NA 133* 130*  --  135  K 4.2 3.9  --  3.6  CL 97* 94*  --  103  CO2 22 22  --  24  GLUCOSE 170* 128*  --  92  BUN 20 28*  --  21  CREATININE 1.38* 1.36*  --  1.18*  CALCIUM 9.2 8.9  --  8.6*  MG  --   --  1.6*  --    GFR Estimated Creatinine Clearance: 36.1 mL/min (A) (by C-G formula based on SCr of 1.18 mg/dL (H)). Liver Function Tests: Recent Labs  Lab 08/23/18 0804 08/26/18 2006 08/28/18 0610  AST 18 28 21   ALT 8 11 10   ALKPHOS 73 65 52  BILITOT 0.7 0.8 0.6  PROT 6.9 7.4 6.5  ALBUMIN 3.3* 3.3* 2.8*   Recent Labs  Lab 08/23/18 1025 08/26/18 2006  LIPASE 24 27   No results for input(s): AMMONIA in the last 168 hours. Coagulation profile No results for input(s): INR, PROTIME in the last 168 hours. COVID-19 Labs  Recent Labs    08/27/18 0143  CRP 10.8*    No results found for: SARSCOV2NAA  CBC: Recent Labs  Lab 08/23/18 0804 08/26/18 2006 08/28/18 0610  WBC 8.0 8.0 7.5  NEUTROABS 4.5  --   --   HGB 10.8* 11.4* 10.2*  HCT 34.6* 35.7* 32.5*  MCV 78.5* 76.9* 80.0  PLT 184 240 226   Cardiac Enzymes: No results for input(s): CKTOTAL, CKMB, CKMBINDEX, TROPONINI in the last 168 hours. BNP (last 3 results) No results for input(s): PROBNP in the last 8760  hours. CBG: Recent Labs  Lab 08/26/18 2304  GLUCAP 128*   D-Dimer: No results for input(s): DDIMER in the last 72 hours. Hgb A1c: Recent Labs    08/27/18 0930  HGBA1C 7.9*   Lipid Profile: No results for input(s): CHOL, HDL, LDLCALC, TRIG, CHOLHDL, LDLDIRECT in the last 72 hours. Thyroid function studies: No results for input(s): TSH, T4TOTAL, T3FREE, THYROIDAB in the last 72 hours.  Invalid input(s): FREET3 Anemia work up: No results for input(s): VITAMINB12, FOLATE, FERRITIN, TIBC, IRON, RETICCTPCT in the last 72 hours. Sepsis Labs: Recent Labs  Lab 08/23/18 0804 08/26/18 2006 08/28/18 0610  WBC 8.0 8.0 7.5   Microbiology No results found for this or any previous visit (from the past 240 hour(s)).   Medications:   . aspirin  81 mg Oral Daily  . clopidogrel  75 mg Oral Daily  . escitalopram  10 mg Oral Daily  . fentaNYL  1 patch Transdermal Q72H  . gabapentin  600 mg Oral BID  . heparin  5,000 Units Subcutaneous Q8H  . insulin glargine  5 Units Subcutaneous BID  . isosorbide mononitrate  30 mg Oral Daily  . magnesium oxide  400 mg Oral Daily  . metoprolol succinate  25 mg Oral Daily  . pantoprazole  40 mg Oral BID  . polyethylene glycol  17 g Oral BID  . promethazine  12.5 mg Oral Q6H  . sodium chloride flush  10-40 mL Intracatheter Q12H   Continuous Infusions: . ondansetron (ZOFRAN) IV 8 mg (08/27/18 0958)       LOS: 0 days   Charlynne Cousins  [facility]  08/28/2018, 8:04 AM

## 2018-08-29 LAB — URINE CULTURE: Culture: >=100000 — AB

## 2018-08-29 LAB — D-DIMER, QUANTITATIVE: D-Dimer, Quant: 1.67 ug/mL-FEU — ABNORMAL HIGH (ref 0.00–0.50)

## 2018-08-29 LAB — LACTATE DEHYDROGENASE: LDH: 173 U/L (ref 98–192)

## 2018-08-29 LAB — C-REACTIVE PROTEIN: CRP: 15.5 mg/dL — ABNORMAL HIGH (ref <1.0)

## 2018-08-29 LAB — FERRITIN: Ferritin: 110 ng/mL (ref 11–307)

## 2018-08-29 NOTE — Progress Notes (Signed)
Patient transferred from bed to chair, extremely weak, required 2 person assist.  Sat in chair for 30 minutes then requested to get back in bed due to fatigue and nausea. Requested regular food for dinner, states that it sounds more appealing than full liquid diet. Advised would advance diet and see if she tolerates.

## 2018-08-29 NOTE — Progress Notes (Signed)
Telephone call to patient's son, Terren Haberle, updated on plan of care and answered all questions.

## 2018-08-29 NOTE — Progress Notes (Signed)
[facility] PROGRESS NOTE    Progress Note  [name]  OAC:[id number] DOB: 09/15/1940 DOA: 08/27/2018 PCP: Lynnell Jude, MD     Brief Narrative:   [name] is an 78 y.o. female past medical history significant for chronic diastolic heart failure, coronary artery disease, with a recent positive SARS-CoV-2 PCR on 0/01/930 comes into [facility] ED for fever nausea not improved with Zofran or Phenergan and diarrhea.  Assessment/Plan:   Acute gastroenteritis due to COVID-19 viral infection intractable nausea with acute diarrhea: Her saturations have remained stable satting greater than 95% on room air. Her inflammatory markers are trending up she is still symptom-free from a respiratory standpoint we will continue to follow saturations and trend inflammatory markers. She has no leukocytosis has remained afebrile abdominal x-ray shows no acute findings, LFTs are unremarkable. She has no respiratory symptoms.  She does relate feeling better today than yesterday she has been tolerating her clear liquid diet.  She would like her diet advanced that she think she tolerated today. KVO her IV fluids.  New hypovolemic hyponatremia: In the setting of severe nausea and acute diarrhea now resolved.  Essential hypertension: Pressures slowly trending up we will continue to monitor closely resume antihypertensive medications as needed.  Uncontrolled diabetes mellitus type 2: With a last A1c of 7.8 back in 2018, good control continue long-acting insulin.  History of critical limb ischemia of the lower extremities: She is currently asymptomatic continue Plavix.  Microcytic anemia: No signs of overt bleeding will need further work-up as an outpatient.  HypoMagnesium: Was repleted orally now resolved.  DVT prophylaxis: lovenox Family Communication:Son Disposition Plan/Barrier to D/C: once diarrhea and nausea resolved. Code Status:     Code Status Orders  (From admission,  onward)         Start     Ordered   08/27/18 0756  Full code  Continuous     08/27/18 0757        Code Status History    Date Active Date Inactive Code Status Order ID Comments User Context   10/04/2016 1952 10/06/2016 2220 Full Code [id number]  Geradine Girt, DO ED   09/18/2016 1535 09/21/2016 1727 Full Code [id number]  Gladstone Lighter, MD Inpatient   01/13/2016 1416 01/15/2016 1915 Full Code [id number]  Arbutus Leas, NP Inpatient   Advance Care Planning Activity        IV Access:    Peripheral IV   Procedures and diagnostic studies:   No results found.   Medical Consultants:    None.  Anti-Infectives:   None  Subjective:    [name] relates she continues to improve her nausea she thinks is resolved she would like to try a full liquid diet and advance to soft she tolerates full liquid diet.  Objective:    Vitals:   08/28/18 2120 08/28/18 2225 08/29/18 0417 08/29/18 0727  BP:   (!) 129/56 (!) 155/60  Pulse: 76 73 66 77  Resp:   19   Temp: (!) 101 F (38.3 C) 99.5 F (37.5 C) 98.1 F (36.7 C) 98 F (36.7 C)  TempSrc: Oral Oral Oral Oral  SpO2: 94% 94% 95% 96%  Weight:      Height:       SpO2: 96 %   Intake/Output Summary (Last 24 hours) at 08/29/2018 0815 Last data filed at 08/28/2018 1227 Gross per 24 hour  Intake 183.8 ml  Output -  Net 183.8 ml   [employer]  08/28/18 1926  Weight: 70.3 kg    Exam: General exam: In no acute distress. Respiratory system: Good air movement and clear to auscultation. Cardiovascular system: S1 & S2 heard, RRR. No JVD. Gastrointestinal system: Abdomen is nondistended, soft and nontender.  Central nervous system: Alert and oriented. No focal neurological deficits. Extremities: No pedal edema. Skin: No rashes, lesions or ulcers Psychiatry: Judgement and insight appear normal. Mood & affect appropriate.   Data Reviewed:    Labs: Basic Metabolic Panel: Recent Labs  Lab 08/23/18 0804  08/26/18 2006 08/27/18 0930 08/28/18 0610  NA 133* 130*  --  135  K 4.2 3.9  --  3.6  CL 97* 94*  --  103  CO2 22 22  --  24  GLUCOSE 170* 128*  --  92  BUN 20 28*  --  21  CREATININE 1.38* 1.36*  --  1.18*  CALCIUM 9.2 8.9  --  8.6*  MG  --   --  1.6*  --    GFR Estimated Creatinine Clearance: 36.7 mL/min (A) (by C-G formula based on SCr of 1.18 mg/dL (H)). Liver Function Tests: Recent Labs  Lab 08/23/18 0804 08/26/18 2006 08/28/18 0610  AST 18 28 21   ALT 8 11 10   ALKPHOS 73 65 52  BILITOT 0.7 0.8 0.6  PROT 6.9 7.4 6.5  ALBUMIN 3.3* 3.3* 2.8*   Recent Labs  Lab 08/23/18 1025 08/26/18 2006  LIPASE 24 27   No results for input(s): AMMONIA in the last 168 hours. Coagulation profile No results for input(s): INR, PROTIME in the last 168 hours. COVID-19 Labs  Recent Labs    08/27/18 0143 08/28/18 1000  DDIMER  --  1.42*  FERRITIN  --  99  LDH  --  165  CRP 10.8* 13.4*    No results found for: SARSCOV2NAA  CBC: Recent Labs  Lab 08/23/18 0804 08/26/18 2006 08/28/18 0610  WBC 8.0 8.0 7.5  NEUTROABS 4.5  --   --   HGB 10.8* 11.4* 10.2*  HCT 34.6* 35.7* 32.5*  MCV 78.5* 76.9* 80.0  PLT 184 240 226   Cardiac Enzymes: No results for input(s): CKTOTAL, CKMB, CKMBINDEX, TROPONINI in the last 168 hours. BNP (last 3 results) No results for input(s): PROBNP in the last 8760 hours. CBG: Recent Labs  Lab 08/26/18 2304  GLUCAP 128*   D-Dimer: Recent Labs    08/28/18 1000  DDIMER 1.42*   Hgb A1c: Recent Labs    08/27/18 0930  HGBA1C 7.9*   Lipid Profile: No results for input(s): CHOL, HDL, LDLCALC, TRIG, CHOLHDL, LDLDIRECT in the last 72 hours. Thyroid function studies: No results for input(s): TSH, T4TOTAL, T3FREE, THYROIDAB in the last 72 hours.  Invalid input(s): FREET3 Anemia work up: Recent Labs    08/28/18 1000  FERRITIN 99   Sepsis Labs: Recent Labs  Lab 08/23/18 0804 08/26/18 2006 08/28/18 0610  WBC 8.0 8.0 7.5    Microbiology Recent Results (from the past 240 hour(s))  Urine culture     Status: Abnormal (Preliminary result)   Collection Time: 08/26/18 11:02 PM   Specimen: Urine, Random  Result Value Ref Range Status   Specimen Description   Final    URINE, RANDOM Performed at [facility], [address] [place], [place] [address]    Special Requests   Final    NONE Performed at [facility], [address] [place], [place] [address]    Culture (A)  Final    >=100,000  COLONIES/mL ESCHERICHIA COLI SUSCEPTIBILITIES TO FOLLOW Performed at [facility] [address], [place], [place] [address]    Report Status PENDING  Incomplete     Medications:   . aspirin  81 mg Oral Daily  . clopidogrel  75 mg Oral Daily  . escitalopram  10 mg Oral Daily  . fentaNYL  1 patch Transdermal Q72H  . gabapentin  600 mg Oral BID  . heparin  5,000 Units Subcutaneous Q8H  . insulin glargine  5 Units Subcutaneous BID  . isosorbide mononitrate  30 mg Oral Daily  . magnesium oxide  400 mg Oral Daily  . metoprolol succinate  25 mg Oral Daily  . pantoprazole  40 mg Oral BID  . polyethylene glycol  17 g Oral BID  . promethazine  12.5 mg Oral Q6H  . sodium chloride flush  10-40 mL Intracatheter Q12H   Continuous Infusions: . ondansetron (ZOFRAN) IV 8 mg (08/27/18 0958)       LOS: 1 day   Charlynne Cousins  [facility]  08/29/2018, 8:15 AM

## 2018-08-30 LAB — COMPREHENSIVE METABOLIC PANEL
ALT: 9 U/L (ref 0–44)
AST: 21 U/L (ref 15–41)
Albumin: 2.4 g/dL — ABNORMAL LOW (ref 3.5–5.0)
Alkaline Phosphatase: 52 U/L (ref 38–126)
Anion gap: 7 (ref 5–15)
BUN: 17 mg/dL (ref 8–23)
CO2: 24 mmol/L (ref 22–32)
Calcium: 9.1 mg/dL (ref 8.9–10.3)
Chloride: 101 mmol/L (ref 98–111)
Creatinine, Ser: 1.04 mg/dL — ABNORMAL HIGH (ref 0.44–1.00)
GFR calc Af Amer: >60 mL/min (ref >60)
GFR calc non Af Amer: 52 mL/min — ABNORMAL LOW (ref >60)
Glucose, Bld: 79 mg/dL (ref 70–99)
Potassium: 3.7 mmol/L (ref 3.5–5.1)
Sodium: 132 mmol/L — ABNORMAL LOW (ref 135–145)
Total Bilirubin: 0.5 mg/dL (ref 0.3–1.2)
Total Protein: 6.2 g/dL — ABNORMAL LOW (ref 6.5–8.1)

## 2018-08-30 LAB — GLUCOSE, CAPILLARY
Glucose-Capillary: 82 mg/dL (ref 70–99)
Glucose-Capillary: 101 mg/dL — ABNORMAL HIGH (ref 70–99)
Glucose-Capillary: 83 mg/dL (ref 70–99)
Glucose-Capillary: 120 mg/dL — ABNORMAL HIGH (ref 70–99)

## 2018-08-30 LAB — CBC
HCT: 31.7 % — ABNORMAL LOW (ref 36.0–46.0)
Hemoglobin: 9.8 g/dL — ABNORMAL LOW (ref 12.0–15.0)
MCH: 24.4 pg — ABNORMAL LOW (ref 26.0–34.0)
MCHC: 30.9 g/dL (ref 30.0–36.0)
MCV: 79.1 fL — ABNORMAL LOW (ref 80.0–100.0)
Platelets: 250 10*3/uL (ref 150–400)
RBC: 4.01 MIL/uL (ref 3.87–5.11)
RDW: 14.6 % (ref 11.5–15.5)
WBC: 7.3 10*3/uL (ref 4.0–10.5)
nRBC: 0 % (ref 0.0–0.2)

## 2018-08-30 LAB — D-DIMER, QUANTITATIVE: D-Dimer, Quant: 1.85 ug/mL-FEU — ABNORMAL HIGH (ref 0.00–0.50)

## 2018-08-30 LAB — C-REACTIVE PROTEIN: CRP: 17 mg/dL — ABNORMAL HIGH (ref <1.0)

## 2018-08-30 LAB — LACTATE DEHYDROGENASE: LDH: 180 U/L (ref 98–192)

## 2018-08-30 LAB — PROCALCITONIN: Procalcitonin: <0.1 ng/mL

## 2018-08-30 LAB — FERRITIN: Ferritin: 124 ng/mL (ref 11–307)

## 2018-08-30 MED ORDER — INSULIN ASPART 100 UNIT/ML ~~LOC~~ SOLN
0.0000 [IU] | Freq: Three times a day (TID) | SUBCUTANEOUS | Status: DC
  Administered 2018-09-01: 17:00:00 1 [IU] via SUBCUTANEOUS
  Administered 2018-09-02: 9 [IU] via SUBCUTANEOUS
  Administered 2018-09-02: 3 [IU] via SUBCUTANEOUS
  Administered 2018-09-02: 2 [IU] via SUBCUTANEOUS
  Administered 2018-09-03: 7 [IU] via SUBCUTANEOUS
  Administered 2018-09-03: 5 [IU] via SUBCUTANEOUS
  Administered 2018-09-03: 13:00:00 7 [IU] via SUBCUTANEOUS
  Administered 2018-09-04: 5 [IU] via SUBCUTANEOUS
  Administered 2018-09-04: 13:00:00 7 [IU] via SUBCUTANEOUS
  Administered 2018-09-04: 9 [IU] via SUBCUTANEOUS
  Administered 2018-09-05: 13:00:00 2 [IU] via SUBCUTANEOUS
  Administered 2018-09-05: 5 [IU] via SUBCUTANEOUS
  Administered 2018-09-05: 09:00:00 2 [IU] via SUBCUTANEOUS
  Administered 2018-09-06: 1 [IU] via SUBCUTANEOUS
  Administered 2018-09-06: 13:00:00 2 [IU] via SUBCUTANEOUS

## 2018-08-30 NOTE — Progress Notes (Signed)
PROGRESS NOTE                                                                                                                                                                                                             Patient Demographics:    [name], is a 78 y.o. female, DOB - 12/27/1940, ZOX:[id number]  Outpatient Primary MD for the patient is Clemmie Krill Lynnell Jude, MD   Admit date - 08/27/2018   LOS - 2  No chief complaint on file.      Brief Narrative: Patient is a 78 y.o. female with PMHx of chronic diastolic heart failure, CAD s/p CABG 2017, PAD s/p left fempo bypass in 2013, history of multiple myeloma in remission, HTN, DM-2, s/p lap band bariatric surgery 2007, chronic pain-presented with vomiting-she was recently diagnosed with COVID-19 on 8/6.  She was subsequently admitted to the hospitalist service.     Subjective:    [name] was nauseous earlier this morning-but when I saw her she was eating breakfast.  No other major events overnight.  No vomiting overnight.   Assessment  & Plan :   Intractable nausea and vomiting: Slowly improving-not sure if this is all secondary to COVID 19-as patient seems to have history of recurrent nausea and vomiting requiring numerous ED visits-over the past few years.  She has a history of lap band bariatric surgery 2007.  Plans are to monitor and provide supportive care.  COVID-19 pneumonia: Currently not requiring oxygen-although inflammatory markers are worsening-need to monitor closely.  RN instructed to notify MD if patient starts requiring O2-as patient will likely qualify for Remdesivir.    Fever: afebrile  O2 requirements: None  COVID-19 Labs: Recent Labs    08/28/18 1000 08/29/18 0820 08/30/18 0420  DDIMER 1.42* 1.67* 1.85*  FERRITIN 99 110 124  LDH 165 173 180  CRP 13.4* 15.5* 17.0*   COVID-19  Medications: Steroids:None Remdesivir:None Actemra:None Convalescent Plasma:N/A Research Studies:N/A  Other medications: Diuretics: Euvolemic-no indication for diuretics  Antibiotics:Not needed as no evidence of bacterial infection  DVT Prophylaxis  :  Heparin  Hypertension: Controlled-continue with Imdur, metoprolol  DM-2: CBGs stable-continue Lantus-add SSI-follow and adjust  History of PAD/CAD s/p CABG: Currently asymptomatic-continue antiplatelets.  Asymptomatic bacteriuria: Symptoms-UA essentially unremarkable-however urine culture drawn in the emergency room on 8/15 shows ESBL E. coli.  Since thought  to have asymptomatic bacteriuria rather than UTI-no indications to start antibiotics  Deconditioning/debility: Await evaluation by rehab services  ABG:    Component Value Date/Time   TCO2 22 03/05/2010 1028    Vent Settings: N/A  Condition - Stable  Family Communication  :  Son updated over the phone on 8/19  Code Status :  Full Code  Diet :  Diet Order            Diet Carb Modified Fluid consistency: Thin; Room service appropriate? Yes  Diet effective now               Disposition Plan  :  Remain hospitalized-Home in the next day or so depending on symptoms/clinical progress  Consults  :  None  Procedures  :  None  Antibiotics  :    Anti-infectives (From admission, onward)   None      Inpatient Medications  Scheduled Meds:  aspirin  81 mg Oral Daily   clopidogrel  75 mg Oral Daily   escitalopram  10 mg Oral Daily   fentaNYL  1 patch Transdermal Q72H   gabapentin  600 mg Oral BID   heparin  5,000 Units Subcutaneous Q8H   insulin glargine  5 Units Subcutaneous BID   isosorbide mononitrate  30 mg Oral Daily   magnesium oxide  400 mg Oral Daily   metoprolol succinate  25 mg Oral Daily   pantoprazole  40 mg Oral BID   promethazine  12.5 mg Oral Q6H   sodium chloride flush  10-40 mL Intracatheter Q12H   Continuous Infusions:   ondansetron (ZOFRAN) IV 8 mg (08/27/18 0958)   PRN Meds:.acetaminophen, fluticasone, HYDROmorphone, nitroGLYCERIN, ondansetron **OR** ondansetron (ZOFRAN) IV, ondansetron, sodium chloride flush   Time Spent in minutes  25  See all Orders from today for further details   Oren Binet M.D on 08/30/2018 at 1:38 PM  To page go to [email or web address] - use universal password  [facility] -  Office  [phone]    Objective:   Vitals:   08/29/18 2029 08/30/18 0416 08/30/18 0800 08/30/18 0900  BP:  (!) 151/60 (!) 159/68   Pulse: 71 84 80 80  Resp:  16 16   Temp: 99.2 F (37.3 C) 97.7 F (36.5 C) (!) 100.7 F (38.2 C) 98.6 F (37 C)  TempSrc: Oral Oral Oral   SpO2: 91% 98% 98% 94%  Weight:      Height:        Wt Readings from Last 3 Encounters:  08/28/18 70.3 kg  08/23/18 68 kg  01/11/18 72.6 kg     Intake/Output Summary (Last 24 hours) at 08/30/2018 1338 Last data filed at 08/30/2018 1047 Gross per 24 hour  Intake 351 ml  Output 100 ml  Net 251 ml     Physical Exam Gen Exam:Alert awake-not in any distress HEENT:atraumatic, normocephalic Chest: B/L clear to auscultation anteriorly CVS:S1S2 regular Abdomen:soft non tender, non distended Extremities:no edema Neurology: Non focal Skin: no rash   Data Review:    CBC Recent Labs  Lab 08/26/18 2006 08/28/18 0610 08/30/18 0842  WBC 8.0 7.5 7.3  HGB 11.4* 10.2* 9.8*  HCT 35.7* 32.5* 31.7*  PLT 240 226 250  MCV 76.9* 80.0 79.1*  MCH 24.6* 25.1* 24.4*  MCHC 31.9 31.4 30.9  RDW 14.7 14.7 14.6    Chemistries  Recent Labs  Lab 08/26/18 2006 08/27/18 0930 08/28/18 0610 08/30/18 0842  NA 130*  --  135 132*  K 3.9  --  3.6 3.7  CL 94*  --  103 101  CO2 22  --  24 24  GLUCOSE 128*  --  92 79  BUN 28*  --  21 17  CREATININE 1.36*  --  1.18* 1.04*  CALCIUM 8.9  --  8.6* 9.1  MG  --  1.6*  --   --   AST 28  --  21 21  ALT 11  --  10 9  ALKPHOS 65  --  52 52  BILITOT 0.8  --  0.6 0.5    ------------------------------------------------------------------------------------------------------------------ No results for input(s): CHOL, HDL, LDLCALC, TRIG, CHOLHDL, LDLDIRECT in the last 72 hours.  Lab Results  Component Value Date   HGBA1C 7.9 (H) 08/27/2018   ------------------------------------------------------------------------------------------------------------------ No results for input(s): TSH, T4TOTAL, T3FREE, THYROIDAB in the last 72 hours.  Invalid input(s): FREET3 ------------------------------------------------------------------------------------------------------------------ Recent Labs    08/29/18 0820 08/30/18 0420  FERRITIN 110 124    Coagulation profile No results for input(s): INR, PROTIME in the last 168 hours.  Recent Labs    08/29/18 0820 08/30/18 0420  DDIMER 1.67* 1.85*    Cardiac Enzymes No results for input(s): CKMB, TROPONINI, MYOGLOBIN in the last 168 hours.  Invalid input(s): CK ------------------------------------------------------------------------------------------------------------------    Component Value Date/Time   BNP 438.8 (H) 01/13/2016 6468    Micro Results Recent Results (from the past 240 hour(s))  Urine culture     Status: Abnormal   Collection Time: 08/26/18 11:02 PM   Specimen: Urine, Random  Result Value Ref Range Status   Specimen Description   Final    URINE, RANDOM Performed at [facility], [address], [place], [place] [address]    Special Requests   Final    NONE Performed at [facility], [address] [place], [place] [address]    Culture (A)  Final    >=100,000 COLONIES/mL ESCHERICHIA COLI Confirmed Extended Spectrum Beta-Lactamase Producer (ESBL).  In bloodstream infections from ESBL organisms, carbapenems are preferred over piperacillin/tazobactam. They are shown to have a lower risk of mortality.    Report Status 08/29/2018 FINAL  Final   Organism ID, Bacteria  ESCHERICHIA COLI (A)  Final      Susceptibility   Escherichia coli - MIC*    AMPICILLIN >=32 RESISTANT Resistant     CEFAZOLIN >=64 RESISTANT Resistant     CEFTRIAXONE >=64 RESISTANT Resistant     CIPROFLOXACIN >=4 RESISTANT Resistant     GENTAMICIN <=1 SENSITIVE Sensitive     IMIPENEM <=0.25 SENSITIVE Sensitive     NITROFURANTOIN <=16 SENSITIVE Sensitive     TRIMETH/SULFA <=20 SENSITIVE Sensitive     AMPICILLIN/SULBACTAM 8 SENSITIVE Sensitive     PIP/TAZO <=4 SENSITIVE Sensitive     Extended ESBL POSITIVE Resistant     * >=100,000 COLONIES/mL ESCHERICHIA COLI    Radiology Reports Ct Head Wo Contrast  Result Date: 08/26/2018 CLINICAL DATA:  Altered mental status. Nausea.  COVID-19 positive EXAM: CT HEAD WITHOUT CONTRAST TECHNIQUE: Contiguous axial images were obtained from the base of the skull through the vertex without intravenous contrast. COMPARISON:  Head CT 10/06/2016 FINDINGS: Brain: Patient had difficulty tolerating the exam, mild motion artifact. No intracranial hemorrhage, mass effect, or midline shift. No hydrocephalus. The basilar cisterns are patent. No evidence of territorial infarct or acute ischemia. No extra-axial or intracranial fluid collection. Unchanged degree of generalized atrophy. Mild chronic small vessel ischemia. Vascular: Atherosclerosis of skullbase vasculature without hyperdense vessel or abnormal calcification. Skull: No fracture or focal lesion.  Sinuses/Orbits: Mucosal thickening of ethmoid air cells. No fluid levels. Mastoid air cells are clear. Bilateral cataract resection. Other: None. IMPRESSION: 1. No acute intracranial abnormality. 2. Unchanged atrophy and chronic small vessel ischemia. Electronically Signed   By: Keith Rake M.D.   On: 08/26/2018 23:39   Dg Chest Portable 1 View  Result Date: 08/26/2018 CLINICAL DATA:  Nausea. Poor p.o. intake. Vomiting. Diagnosed with COVID-19 5 days ago. EXAM: PORTABLE CHEST 1 VIEW COMPARISON:  08/23/2018.  FINDINGS: Normal sized heart. Mildly tortuous and calcified thoracic aorta. Stable mild diffuse peribronchial thickening and accentuation of the interstitial markings. Interval minimal patchy opacity in the lateral aspect of the right mid upper lung zone. Mild linear densities in both lungs with mild progression. Stable post CABG changes, lower thoracic spine degenerative changes and diffuse osteopenia. A stat after a IMPRESSION: 1. Interval minimal patchy opacity in right mid upper lung zone laterally. This may represent early lung changes due to the patient's COVID-19. 2. Mildly progressive linear atelectasis in both lungs. 3. Stable mild chronic bronchitic changes. Electronically Signed   By: Claudie Revering M.D.   On: 08/26/2018 21:49   Dg Chest Portable 1 View  Result Date: 08/23/2018 CLINICAL DATA:  Nausea.  COVID-19 positive. EXAM: PORTABLE CHEST 1 VIEW COMPARISON:  January 11, 2018 FINDINGS: The heart size and mediastinal contours are stable. Both lungs are clear. The visualized skeletal structures are stable. IMPRESSION: No acute cardiopulmonary disease identified. Electronically Signed   By: Abelardo Diesel M.D.   On: 08/23/2018 08:39   Dg Abd 2 Views  Result Date: 08/26/2018 CLINICAL DATA:  Nausea. Poor oral intake. Diagnosed with COVID-19 5 days ago. Three episodes of vomiting today. EXAM: ABDOMEN - 2 VIEW COMPARISON:  10/04/2016. FINDINGS: Normal bowel gas pattern. No significant stool. Stable gastric band. Interval 4 mm left mid abdominal calcification. Stable thoracolumbar scoliosis and degenerative changes. Atheromatous arterial calcifications without visible aneurysm. Post CABG changes. Bilateral proximal thigh vascular stents. IMPRESSION: 1. No acute abnormality. 2. Interval 4 mm left mid abdominal calcification. This could represent a phlebolith or urinary tract calcification. Electronically Signed   By: Claudie Revering M.D.   On: 08/26/2018 21:45

## 2018-08-30 NOTE — Progress Notes (Signed)
Patient was seen at [facility] on 08/27/18 for intractable nausea and recently tested positive for COVID-19. Urine samples were obtained at [facility] and received the ED culture report from today which indicated E.coli (ESBL confirmed) grew >100,000 CFU/mL. Sensitives came back. She was transferred to [facility].   I discussed the ED culture report with the patient's attending, Dr. Sloan Leiter today.   Thank you for allowing pharmacy to be a part of this patient's care.  Kristeen Miss, PharmD Clinical Pharmacist

## 2018-08-30 NOTE — Progress Notes (Signed)
Called pt's son to update him on her status.  Son asked if he could bring his mother her Best boy.  I told him he could.

## 2018-08-30 NOTE — Evaluation (Signed)
Occupational Therapy Evaluation Patient Details Name: [name] Bound MRN: [id number] DOB: January 31, 1940 Today's Date: 08/30/2018    History of Present Illness Pt is a 78 y.o. female with PMH significant for Anxiety, Arthritis, CAD, CHF, Chronic pain, HTN, MI (08/2015), Multiple myeloma (dx'd 2015), PAD, Stroke, and DM2; positive SARS-CoV-2 on 08/17/2018,experiencing severe nausea without vomiting, decreased activity tolerance, fatigue.   Clinical Impression   PTA Pt was mod I in home environment with RW and DME (uses motorized scooter for community), lives alone with family close by. Today Pt limited by nausea/diarrhea. Able to perform own peri care in standing with min guard and walk around bed with RW at min guard assist level. Pt presents with decreased activity tolerance, balance requiring increased support, and skilled OT in the acute setting as well as afterwards at the SNF level. VSS throughout session. On RA throughout session and O2 >93%.     Follow Up Recommendations  SNF    Equipment Recommendations  None recommended by OT(Pt has appropriate DME)    Recommendations for Other Services PT consult     Precautions / Restrictions Precautions Precautions: Fall Restrictions Weight Bearing Restrictions: No      Mobility Bed Mobility Overal bed mobility: Needs Assistance Bed Mobility: Supine to Sit;Sit to Supine     Supine to sit: Min assist(Pt pulling up on therapist) Sit to supine: Mod assist(assist for BLE back into bed)   General bed mobility comments: assist in and out of bed  Transfers Overall transfer level: Needs assistance Equipment used: Rolling walker (2 wheeled) Transfers: Sit to/from [facility] Sit to Stand: Min guard;Min assist Stand pivot transfers: Min guard;Min assist       General transfer comment: min guard assist for balance and boost    Balance Overall balance assessment: Needs assistance Sitting-balance support: Single  extremity supported;Feet supported Sitting balance-Leahy Scale: Fair     Standing balance support: Bilateral upper extremity supported Standing balance-Leahy Scale: Poor Standing balance comment: dependent on support from RW                           ADL either performed or assessed with clinical judgement   ADL Overall ADL's : Needs assistance/impaired Eating/Feeding: Modified independent   Grooming: Wash/dry hands;Wash/dry face;Set up;Sitting Grooming Details (indicate cue type and reason): EOB after BSC transfer Upper Body Bathing: Moderate assistance;Sitting   Lower Body Bathing: Moderate assistance;Sit to/from stand   Upper Body Dressing : Set up;Sitting   Lower Body Dressing: Minimal assistance;Sit to/from stand   Toilet Transfer: Minimal assistance;Stand-pivot;RW Armed forces technical officer Details (indicate cue type and reason): quickly due to pending diarrhea Toileting- Clothing Manipulation and Hygiene: Min guard;Sit to/from stand Toileting - Clothing Manipulation Details (indicate cue type and reason): able to perform own peri care (front and back) with warm wash cloths Tub/ Shower Transfer: Moderate assistance   Functional mobility during ADLs: Minimal assistance;Rolling walker(decreased speed and stamina) General ADL Comments: decreased activity tolerance, balance     Vision Baseline Vision/History: No visual deficits Patient Visual Report: No change from baseline       Perception     Praxis      Pertinent Vitals/Pain Pain Assessment: No/denies pain     Hand Dominance Right   Extremity/Trunk Assessment Upper Extremity Assessment Upper Extremity Assessment: Generalized weakness   Lower Extremity Assessment Lower Extremity Assessment: Defer to PT evaluation;Generalized weakness       Communication Communication Communication: No difficulties   Cognition Arousal/Alertness:  Awake/alert Behavior During Therapy: Flat affect Overall Cognitive  Status: Impaired/Different from baseline Area of Impairment: Following commands;Safety/judgement;Awareness                       Following Commands: Follows one step commands with increased time Safety/Judgement: Decreased awareness of safety Awareness: Emergent   General Comments: Pt slow to answer orientation questions and home set up information   General Comments       Exercises     Shoulder Instructions      Home Living Family/patient expects to be discharged to:: Private residence Living Arrangements: Alone Available Help at Discharge: Family;Available 24 hours/day Type of Home: House Home Access: Ramped entrance     Home Layout: One level     Bathroom Shower/Tub: Teacher, early years/pre: Handicapped height Bathroom Accessibility: Yes How Accessible: Accessible via walker Home Equipment: [place] - 2 wheels;Bedside commode;Electric scooter;Shower seat;Grab bars - tub/shower          Prior Functioning/Environment Level of Independence: Independent with assistive device(s)        Comments: uses RW for mobility in home and scooter for community        OT Problem List: Decreased activity tolerance;Decreased strength;Impaired balance (sitting and/or standing);Decreased safety awareness;Decreased knowledge of use of DME or AE;Decreased knowledge of precautions;Increased edema      OT Treatment/Interventions: Self-care/ADL training;Therapeutic exercise;Energy conservation;DME and/or AE instruction;Therapeutic activities;Patient/family education;Balance training    OT Goals(Current goals can be found in the care plan section) Acute Rehab OT Goals Patient Stated Goal: to feel better and get back to where I can cook again OT Goal Formulation: With patient Time For Goal Achievement: 09/13/18 Potential to Achieve Goals: Good ADL Goals Pt Will Perform Grooming: with supervision;standing Pt Will Perform Upper Body Dressing: with modified  independence;sitting Pt Will Perform Lower Body Dressing: with supervision;sit to/from stand Pt Will Transfer to Toilet: with supervision;ambulating Pt Will Perform Toileting - Clothing Manipulation and hygiene: with modified independence;sit to/from stand Pt/caregiver will Perform Home Exercise Program: Increased strength;Both right and left upper extremity;Independently;With theraband;With written HEP provided Additional ADL Goal #1: Pt will recall at least 3 ways of conserving energy during ADL routine at independent level  OT Frequency: Min 2X/week   Barriers to D/C: Decreased caregiver support  Pt lives alone       Co-evaluation              AM-PAC OT "6 Clicks" Daily Activity     Outcome Measure Help from another person eating meals?: None Help from another person taking care of personal grooming?: A Little Help from another person toileting, which includes using toliet, bedpan, or urinal?: A Little Help from another person bathing (including washing, rinsing, drying)?: A Lot Help from another person to put on and taking off regular upper body clothing?: A Little Help from another person to put on and taking off regular lower body clothing?: A Lot 6 Click Score: 17   End of Session Equipment Utilized During Treatment: Rolling walker Nurse Communication: Mobility status(cleaned BSC after diarrhea)  Activity Tolerance: Patient limited by fatigue(and nausea) Patient left: in bed;with call bell/phone within reach  OT Visit Diagnosis: Unsteadiness on feet (R26.81);Muscle weakness (generalized) (M62.81);Other symptoms and signs involving cognitive function                Time: 2800-3491 OT Time Calculation (min): 36 min Charges:  OT General Charges $OT Visit: 1 Visit OT Evaluation $OT Eval Moderate Complexity: 1 Mod  OT Treatments $Self Care/Home Management : 8-22 mins  Hulda Humphrey OTR/L Acute Rehabilitation Services Pager: [phone] Office:  [place] 08/30/2018, 5:16 PM

## 2018-08-31 LAB — COMPREHENSIVE METABOLIC PANEL
ALT: 12 U/L (ref 0–44)
AST: 23 U/L (ref 15–41)
Albumin: 2.5 g/dL — ABNORMAL LOW (ref 3.5–5.0)
Alkaline Phosphatase: 50 U/L (ref 38–126)
Anion gap: 12 (ref 5–15)
BUN: 18 mg/dL (ref 8–23)
CO2: 21 mmol/L — ABNORMAL LOW (ref 22–32)
Calcium: 9.1 mg/dL (ref 8.9–10.3)
Chloride: 98 mmol/L (ref 98–111)
Creatinine, Ser: 1.07 mg/dL — ABNORMAL HIGH (ref 0.44–1.00)
GFR calc Af Amer: 58 mL/min — ABNORMAL LOW (ref >60)
GFR calc non Af Amer: 50 mL/min — ABNORMAL LOW (ref >60)
Glucose, Bld: 91 mg/dL (ref 70–99)
Potassium: 3.7 mmol/L (ref 3.5–5.1)
Sodium: 131 mmol/L — ABNORMAL LOW (ref 135–145)
Total Bilirubin: 0.5 mg/dL (ref 0.3–1.2)
Total Protein: 6.5 g/dL (ref 6.5–8.1)

## 2018-08-31 LAB — CBC
HCT: 32 % — ABNORMAL LOW (ref 36.0–46.0)
Hemoglobin: 10 g/dL — ABNORMAL LOW (ref 12.0–15.0)
MCH: 24.5 pg — ABNORMAL LOW (ref 26.0–34.0)
MCHC: 31.3 g/dL (ref 30.0–36.0)
MCV: 78.4 fL — ABNORMAL LOW (ref 80.0–100.0)
Platelets: 292 10*3/uL (ref 150–400)
RBC: 4.08 MIL/uL (ref 3.87–5.11)
RDW: 14.6 % (ref 11.5–15.5)
WBC: 8.3 10*3/uL (ref 4.0–10.5)
nRBC: 0 % (ref 0.0–0.2)

## 2018-08-31 LAB — C-REACTIVE PROTEIN: CRP: 17.7 mg/dL — ABNORMAL HIGH (ref <1.0)

## 2018-08-31 LAB — GLUCOSE, CAPILLARY
Glucose-Capillary: 82 mg/dL (ref 70–99)
Glucose-Capillary: 78 mg/dL (ref 70–99)
Glucose-Capillary: 83 mg/dL (ref 70–99)
Glucose-Capillary: 78 mg/dL (ref 70–99)

## 2018-08-31 LAB — FERRITIN: Ferritin: 136 ng/mL (ref 11–307)

## 2018-08-31 LAB — D-DIMER, QUANTITATIVE: D-Dimer, Quant: 2.45 ug/mL-FEU — ABNORMAL HIGH (ref 0.00–0.50)

## 2018-08-31 NOTE — Progress Notes (Signed)
PROGRESS NOTE                                                                                                                                                                                                             Patient Demographics:    [name], is a 78 y.o. female, DOB - 02-08-1940, WIO:[id number]  Outpatient Primary MD for the patient is Clemmie Krill Lynnell Jude, MD   Admit date - 08/27/2018   LOS - 3  No chief complaint on file.      Brief Narrative: Patient is a 78 y.o. female with PMHx of chronic diastolic heart failure, CAD s/p CABG 2017, PAD s/p left fempo bypass in 2013, history of multiple myeloma in remission, HTN, DM-2, s/p lap band bariatric surgery 2007, chronic pain-presented with vomiting-she was recently diagnosed with COVID-19 on 8/6.  She was subsequently admitted to the hospitalist service.     Subjective:    [name] has some mild nausea this morning (claims she has chronic nausea for the past several years).  No vomiting however.  No major events overnight per RN.   Assessment  & Plan :   Intractable nausea and vomiting: Has improved-suspect has some amount of chronic nausea at baseline (patient acknowledges as well).  Continue supportive care.  She seems to be tolerating advancement of diet well.    COVID-19 pneumonia: Remains very stable on room air-O2 saturation remains in the high 90s this morning.  Inflammatory markers are still significantly elevated.  Continue close monitoring-if patient develops hypoxemia-we can consider Remdesivir.    Fever: afebrile  O2 requirements: None  COVID-19 Labs: Recent Labs    08/29/18 0820 08/30/18 0420 08/31/18 0345  DDIMER 1.67* 1.85* 2.45*  FERRITIN 110 124 136  LDH 173 180  --   CRP 15.5* 17.0* 17.7*   COVID-19 Medications: Steroids:None Remdesivir:None Actemra:None Convalescent Plasma:N/A Research Studies:N/A  Other medications: Diuretics:  Euvolemic-no indication for diuretics  Antibiotics:Not needed as no evidence of bacterial infection  DVT Prophylaxis  :  Heparin  Hypertension: Controlled-new Imdur and metoprolol.    DM-2: CBGs stable-continue Lantus and SSI.    CBG (last 3)  Recent Labs    08/30/18 1535 08/30/18 2101 08/31/18 0829  GLUCAP 83 120* 82    History of PAD/CAD s/p CABG: Currently asymptomatic-continue antiplatelets.  Asymptomatic bacteriuria: With no symptoms  of UTI-UA essentially unremarkable-however urine culture drawn in the emergency room on 8/15 shows ESBL E. coli.  Since thought to have asymptomatic bacteriuria rather than UTI-no indications to start antibiotics  Deconditioning/debility: Evaluated by OT-and thought to require SNF.  Will discuss with family and social work this afternoon.  ABG:    Component Value Date/Time   TCO2 22 03/05/2010 1028    Vent Settings: N/A  Condition - Stable  Family Communication  :  Son updated over the phone on 8/20  Code Status :  Full Code  Diet :  Diet Order            Diet Carb Modified Fluid consistency: Thin; Room service appropriate? Yes  Diet effective now               Disposition Plan  :  Remain hospitalized-we will discuss with social work of patient can be transferred to SNF on discharge as recommended by rehab services.  Consults  :  None  Procedures  :  None  Antibiotics  :    Anti-infectives (From admission, onward)   None      Inpatient Medications  Scheduled Meds:  aspirin  81 mg Oral Daily   clopidogrel  75 mg Oral Daily   escitalopram  10 mg Oral Daily   fentaNYL  1 patch Transdermal Q72H   gabapentin  600 mg Oral BID   heparin  5,000 Units Subcutaneous Q8H   insulin aspart  0-9 Units Subcutaneous TID WC   insulin glargine  5 Units Subcutaneous BID   isosorbide mononitrate  30 mg Oral Daily   magnesium oxide  400 mg Oral Daily   metoprolol succinate  25 mg Oral Daily   pantoprazole  40 mg Oral  BID   promethazine  12.5 mg Oral Q6H   sodium chloride flush  10-40 mL Intracatheter Q12H   Continuous Infusions:  ondansetron (ZOFRAN) IV 8 mg (08/27/18 0958)   PRN Meds:.acetaminophen, fluticasone, HYDROmorphone, nitroGLYCERIN, ondansetron **OR** ondansetron (ZOFRAN) IV, ondansetron, sodium chloride flush   Time Spent in minutes  25  See all Orders from today for further details   Oren Binet M.D on 08/31/2018 at 10:41 AM  To page go to [email or web address] - use universal password  [facility] -  Office  [phone]    Objective:   Vitals:   08/31/18 0515 08/31/18 0530 08/31/18 0800 08/31/18 0856  BP: (!) 120/43 (!) 130/40 (!) 153/53   Pulse: 75 73 65 69  Resp:      Temp:    97.6 F (36.4 C)  TempSrc:    Oral  SpO2: 96% 97% 100% 100%  Weight:      Height:        Wt Readings from Last 3 Encounters:  08/28/18 70.3 kg  08/23/18 68 kg  01/11/18 72.6 kg     Intake/Output Summary (Last 24 hours) at 08/31/2018 1041 Last data filed at 08/30/2018 1800 Gross per 24 hour  Intake 552 ml  Output --  Net 552 ml     Physical Exam Gen Exam:Alert awake-not in any distress HEENT:atraumatic, normocephalic Chest: B/L clear to auscultation anteriorly CVS:S1S2 regular Abdomen:soft non tender, non distended Extremities:no edema Neurology: Non focal Skin: no rash   Data Review:    CBC Recent Labs  Lab 08/26/18 2006 08/28/18 0610 08/30/18 0842 08/31/18 0345  WBC 8.0 7.5 7.3 8.3  HGB 11.4* 10.2* 9.8* 10.0*  HCT 35.7* 32.5* 31.7* 32.0*  PLT 240 226 250 292  MCV  76.9* 80.0 79.1* 78.4*  MCH 24.6* 25.1* 24.4* 24.5*  MCHC 31.9 31.4 30.9 31.3  RDW 14.7 14.7 14.6 14.6    Chemistries  Recent Labs  Lab 08/26/18 2006 08/27/18 0930 08/28/18 0610 08/30/18 0842 08/31/18 0345  NA 130*  --  135 132* 131*  K 3.9  --  3.6 3.7 3.7  CL 94*  --  103 101 98  CO2 22  --  24 24 21*  GLUCOSE 128*  --  92 79 91  BUN 28*  --  21 17 18   CREATININE 1.36*  --  1.18*  1.04* 1.07*  CALCIUM 8.9  --  8.6* 9.1 9.1  MG  --  1.6*  --   --   --   AST 28  --  21 21 23   ALT 11  --  10 9 12   ALKPHOS 65  --  52 52 50  BILITOT 0.8  --  0.6 0.5 0.5   ------------------------------------------------------------------------------------------------------------------ No results for input(s): CHOL, HDL, LDLCALC, TRIG, CHOLHDL, LDLDIRECT in the last 72 hours.  Lab Results  Component Value Date   HGBA1C 7.9 (H) 08/27/2018   ------------------------------------------------------------------------------------------------------------------ No results for input(s): TSH, T4TOTAL, T3FREE, THYROIDAB in the last 72 hours.  Invalid input(s): FREET3 ------------------------------------------------------------------------------------------------------------------ Recent Labs    08/30/18 0420 08/31/18 0345  FERRITIN 124 136    Coagulation profile No results for input(s): INR, PROTIME in the last 168 hours.  Recent Labs    08/30/18 0420 08/31/18 0345  DDIMER 1.85* 2.45*    Cardiac Enzymes No results for input(s): CKMB, TROPONINI, MYOGLOBIN in the last 168 hours.  Invalid input(s): CK ------------------------------------------------------------------------------------------------------------------    Component Value Date/Time   BNP 438.8 (H) 01/13/2016 3557    Micro Results Recent Results (from the past 240 hour(s))  Urine culture     Status: Abnormal   Collection Time: 08/26/18 11:02 PM   Specimen: Urine, Random  Result Value Ref Range Status   Specimen Description   Final    URINE, RANDOM Performed at [facility], [address], [place], [place] [address]    Special Requests   Final    NONE Performed at [facility], [address] [place],  [address]    Culture (A)  Final    >=100,000 COLONIES/mL ESCHERICHIA COLI Confirmed Extended Spectrum Beta-Lactamase Producer (ESBL).  In bloodstream infections from ESBL organisms,  carbapenems are preferred over piperacillin/tazobactam. They are shown to have a lower risk of mortality.    Report Status 08/29/2018 FINAL  Final   Organism ID, Bacteria ESCHERICHIA COLI (A)  Final      Susceptibility   Escherichia coli - MIC*    AMPICILLIN >=32 RESISTANT Resistant     CEFAZOLIN >=64 RESISTANT Resistant     CEFTRIAXONE >=64 RESISTANT Resistant     CIPROFLOXACIN >=4 RESISTANT Resistant     GENTAMICIN <=1 SENSITIVE Sensitive     IMIPENEM <=0.25 SENSITIVE Sensitive     NITROFURANTOIN <=16 SENSITIVE Sensitive     TRIMETH/SULFA <=20 SENSITIVE Sensitive     AMPICILLIN/SULBACTAM 8 SENSITIVE Sensitive     PIP/TAZO <=4 SENSITIVE Sensitive     Extended ESBL POSITIVE Resistant     * >=100,000 COLONIES/mL ESCHERICHIA COLI    Radiology Reports Ct Head Wo Contrast  Result Date: 08/26/2018 CLINICAL DATA:  Altered mental status. Nausea.  COVID-19 positive EXAM: CT HEAD WITHOUT CONTRAST TECHNIQUE: Contiguous axial images were obtained from the base of the skull through the vertex without intravenous contrast. COMPARISON:  Head CT 10/06/2016 FINDINGS: Brain: Patient had difficulty tolerating the exam, mild motion artifact. No intracranial hemorrhage, mass effect, or midline shift. No hydrocephalus. The basilar cisterns are patent. No evidence of territorial infarct or acute ischemia. No extra-axial or intracranial fluid collection. Unchanged degree of generalized atrophy. Mild chronic small vessel ischemia. Vascular: Atherosclerosis of skullbase vasculature without hyperdense vessel or abnormal calcification. Skull: No fracture or focal lesion. Sinuses/Orbits: Mucosal thickening of ethmoid air cells. No fluid levels. Mastoid air cells are clear. Bilateral cataract resection. Other: None. IMPRESSION: 1. No acute intracranial abnormality. 2. Unchanged atrophy and chronic small vessel ischemia. Electronically Signed   By: Keith Rake M.D.   On: 08/26/2018 23:39   Dg Chest Portable 1  View  Result Date: 08/26/2018 CLINICAL DATA:  Nausea. Poor p.o. intake. Vomiting. Diagnosed with COVID-19 5 days ago. EXAM: PORTABLE CHEST 1 VIEW COMPARISON:  08/23/2018. FINDINGS: Normal sized heart. Mildly tortuous and calcified thoracic aorta. Stable mild diffuse peribronchial thickening and accentuation of the interstitial markings. Interval minimal patchy opacity in the lateral aspect of the right mid upper lung zone. Mild linear densities in both lungs with mild progression. Stable post CABG changes, lower thoracic spine degenerative changes and diffuse osteopenia. A stat after a IMPRESSION: 1. Interval minimal patchy opacity in right mid upper lung zone laterally. This may represent early lung changes due to the patient's COVID-19. 2. Mildly progressive linear atelectasis in both lungs. 3. Stable mild chronic bronchitic changes. Electronically Signed   By: Claudie Revering M.D.   On: 08/26/2018 21:49   Dg Chest Portable 1 View  Result Date: 08/23/2018 CLINICAL DATA:  Nausea.  COVID-19 positive. EXAM: PORTABLE CHEST 1 VIEW COMPARISON:  January 11, 2018 FINDINGS: The heart size and mediastinal contours are stable. Both lungs are clear. The visualized skeletal structures are stable. IMPRESSION: No acute cardiopulmonary disease identified. Electronically Signed   By: Abelardo Diesel M.D.   On: 08/23/2018 08:39   Dg Abd 2 Views  Result Date: 08/26/2018 CLINICAL DATA:  Nausea. Poor oral intake. Diagnosed with COVID-19 5 days ago. Three episodes of vomiting today. EXAM: ABDOMEN - 2 VIEW COMPARISON:  10/04/2016. FINDINGS: Normal bowel gas pattern. No significant stool. Stable gastric band. Interval 4 mm left mid abdominal calcification. Stable thoracolumbar scoliosis and degenerative changes. Atheromatous arterial calcifications without visible aneurysm. Post CABG changes. Bilateral proximal thigh vascular stents. IMPRESSION: 1. No acute abnormality. 2. Interval 4 mm left mid abdominal calcification. This could  represent a phlebolith or urinary tract calcification. Electronically Signed   By: Claudie Revering M.D.   On: 08/26/2018 21:45

## 2018-08-31 NOTE — Progress Notes (Signed)
Phoned the Pt's son, not available.  Will try and call back later

## 2018-08-31 NOTE — Progress Notes (Signed)
Attempted to get up with PT/ and was vomiting

## 2018-08-31 NOTE — Evaluation (Signed)
Physical Therapy Evaluation Patient Details Name: [name] MRN: [id number] DOB: September 27, 1940 Today's Date: 08/31/2018   History of Present Illness  Pt is a 78 y.o. female with PMH significant for Anxiety, Arthritis, CAD, CHF, Chronic pain, HTN, MI (08/2015), Multiple myeloma (dx'd 2015), PAD, Stroke, and DM2; positive SARS-CoV-2 on 08/17/2018,experiencing severe nausea without vomiting, decreased activity tolerance, fatigue.  Clinical Impression  Pt is actively vomiting on herself during my session.  RN made aware and PT assisted pt in getting cleaned up and bed linen and gown changed.  She is very weak.  Her BP was elevated to 366Y systolic (RN made aware).  She is unable to participate in OOB mobility with me today. At her current presentation she is most appropriate for post acute rehab.   PT to follow acutely for deficits listed below.    Follow Up Recommendations SNF(too weak to go home right now)    Equipment Recommendations  None recommended by PT    Recommendations for Other Services   NA    Precautions / Restrictions Precautions Precautions: Fall      Mobility  Bed Mobility Overal bed mobility: Needs Assistance Bed Mobility: Rolling;Supine to Sit Rolling: Min assist   Supine to sit: Min guard     General bed mobility comments: Min assist to roll bil for clean up of pt and min hand held assist to come to long sitting to pull dirty gown out.   Transfers                 General transfer comment: Pt did not feel up for OOB  Ambulation/Gait             General Gait Details: unable due to active N/V            Pertinent Vitals/Pain Pain Assessment: Faces Faces Pain Scale: Hurts even more Pain Location: abdomen Pain Descriptors / Indicators: Grimacing;Guarding Pain Intervention(s): Limited activity within patient's tolerance;Monitored during session;Repositioned;Other (comment)(brought gingerale and saltines)    Home Living Family/patient  expects to be discharged to:: Private residence Living Arrangements: Alone Available Help at Discharge: Family;Available 24 hours/day Type of Home: House Home Access: Ramped entrance     Home Layout: One level Home Equipment: Walker - 2 wheels;Bedside commode;Electric scooter;Shower seat;Grab bars - tub/shower      Prior Function Level of Independence: Independent with assistive device(s)         Comments: uses RW for mobility in home and scooter for community     Hand Dominance   Dominant Hand: Right    Extremity/Trunk Assessment   Upper Extremity Assessment Upper Extremity Assessment: Generalized weakness    Lower Extremity Assessment Lower Extremity Assessment: Generalized weakness    Cervical / Trunk Assessment Cervical / Trunk Assessment: Normal  Communication   Communication: No difficulties  Cognition Arousal/Alertness: Awake/alert Behavior During Therapy: Flat affect Overall Cognitive Status: No family/caregiver present to determine baseline cognitive functioning                                 General Comments: Not specifically tested as pt was so nauseated and vomiting      General Comments General comments (skin integrity, edema, etc.): O2 sats stable on RA, BP elevated (RN made aware, however, may be due to pt just vomiting).         Assessment/Plan    PT Assessment Patient needs continued PT services  PT Problem List  Decreased strength;Decreased activity tolerance;Decreased balance;Decreased mobility;Decreased cognition;Decreased knowledge of use of DME;Cardiopulmonary status limiting activity;Pain       PT Treatment Interventions DME instruction;Stair training;Gait training;Functional mobility training;Therapeutic activities;Therapeutic exercise;Balance training;Cognitive remediation;Patient/family education    PT Goals (Current goals can be found in the Care Plan section)  Acute Rehab PT Goals Patient Stated Goal: did not  state, other than she wanted the nausea to go away PT Goal Formulation: With patient Time For Goal Achievement: 09/14/18 Potential to Achieve Goals: Good    Frequency Min 3X/week           AM-PAC PT "6 Clicks" Mobility  Outcome Measure Help needed turning from your back to your side while in a flat bed without using bedrails?: A Little Help needed moving from lying on your back to sitting on the side of a flat bed without using bedrails?: A Little Help needed moving to and from a bed to a chair (including a wheelchair)?: A Little Help needed standing up from a chair using your arms (e.g., wheelchair or bedside chair)?: A Little Help needed to walk in hospital room?: A Little Help needed climbing 3-5 steps with a railing? : A Little 6 Click Score: 18    End of Session   Activity Tolerance: Treatment limited secondary to medical complications (Comment) Patient left: in bed Nurse Communication: Mobility status;Other (comment)(nauseated and vomiting) PT Visit Diagnosis: Muscle weakness (generalized) (M62.81);Difficulty in walking, not elsewhere classified (R26.2);Pain Pain - Right/Left: (abdomen) Pain - part of body: (abdomen)    Time: 6269-4854 PT Time Calculation (min) (ACUTE ONLY): 15 min   Charges:        Wells Guiles B. Amandeep [place], PT, DPT  Acute Rehabilitation [phone] pager [phone]) [phone] office  @ [place] [place]: [phone]   PT Evaluation $PT Eval Moderate Complexity: 1 Mod         08/31/2018, 3:40 PM

## 2018-08-31 NOTE — NC FL2 (Signed)
[place] LEVEL OF CARE SCREENING TOOL     IDENTIFICATION  Patient Name: [name] Birthdate: 09/13/1940 Sex: female Admission Date (Current Location): 08/27/2018  [facility] and [place] Number:  Engineering geologist and Address:  The . [facility], [place] [address] [place], [place] [address]      Provider Number: [id number]  Attending Physician Name and Address:  Jonetta Osgood, MD  Relative Name and Phone Number:  Catalina Salasar; son [phone]    Current Level of Care: Hospital Recommended Level of Care: [place] Prior Approval Number:    Date Approved/Denied:   PASRR Number: [id number]  Discharge Plan: SNF    Current Diagnoses: Patient Active Problem List   Diagnosis Date Noted  . Hyponatremia 08/27/2018  . Microcytic anemia 08/27/2018  . Nausea 08/27/2018  . Gastroenteritis due to COVID-19 virus 08/27/2018  . Severe nausea 10/05/2016  . Nausea & vomiting 10/04/2016  . Fistula 09/30/2016  . Intractable nausea and vomiting 09/18/2016  . Lower extremity edema 05/10/2016  . Ischemic cardiomyopathy 02/23/2016  . Chills 02/23/2016  . Weight loss 02/23/2016  . Unstable angina pectoris ([place]) 01/13/2016  . Chest pain   . PVD (peripheral vascular disease) ([place])   . Coronary artery disease involving coronary bypass graft of native heart with angina pectoris ([place])   . Peripheral arterial occlusive disease ([place]) 12/15/2015  . Acute blood loss anemia 11/13/2015  . Leukocytosis 11/13/2015  . Chronic pain 11/12/2015  . Acute postoperative pain 11/12/2015  . 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 Acute UTI 11/05/2015  . Edema of left lower extremity 06/16/2015  . Edema of upper extremity 06/16/2015  . Knee pain, left 06/14/2015  . AKI (acute kidney injury) ([place]) 06/11/2015  . Hyperkalemia 06/11/2015  . Hypomagnesemia 06/11/2015  . Acute leg pain, left 05/20/2015  . Multiple bruises 05/20/2015  . Recurrent urinary tract  infection 03/26/2014  . Recurrent UTI 03/26/2014  . H/O gastric bypass 11/07/2013  . Peripheral neuropathy 10/10/2013  . Encounter for antineoplastic chemotherapy 10/10/2013  . Multiple myeloma ([place]) 07/16/2013  . History of critical ischemia of lower extremity 05/15/2013  . Critical lower limb ischemia 05/15/2013  . Intermittent claudication ([place]) 07/27/2011  . Chronic coronary artery disease 04/26/2011  . Gastroesophageal reflux disease 04/26/2011  . Type 2 diabetes mellitus ([place]) 03/30/2011  . Hyperlipidemia, unspecified 03/30/2011  . Hypertension 03/30/2011    Orientation RESPIRATION BLADDER Height & Weight     Self, Time, Situation, Place  Normal Continent Weight: 155 lb (70.3 kg) Height:  5' 2"  (157.5 cm)  BEHAVIORAL SYMPTOMS/MOOD NEUROLOGICAL BOWEL NUTRITION STATUS      Incontinent Diet(see discharge summary)  AMBULATORY STATUS COMMUNICATION OF NEEDS Skin   Extensive Assist Verbally Other (Comment)(dry; generalized ecchymosis)                       Personal Care Assistance Level of Assistance  Bathing, Dressing, Feeding Bathing Assistance: Maximum assistance Feeding assistance: Independent Dressing Assistance: Maximum assistance     Functional Limitations Info  Sight, Hearing, Speech Sight Info: Adequate Hearing Info: Adequate Speech Info: Adequate    SPECIAL CARE FACTORS FREQUENCY  PT (By licensed PT), OT (By licensed OT)     PT Frequency: 5x week OT Frequency: 5x week            Contractures Contractures Info: Not present    Additional Factors Info  Code Status, Allergies, Psychotropic, Insulin Sliding Scale, Isolation Precautions Code Status  Info: Full Code Allergies Info: smoke, statins, ticagrelor, amoxicillin, codeine, sulfa antibiotics, metoclopramide, duloxetine, benzodiazepines Psychotropic Info: escitalopram (LEXAPRO) tablet 10 mg daily PO Insulin Sliding Scale Info: insulin glargine (LANTUS) injection 5 Units 2x daily; insulin aspart  (novoLOG) injection 0-9 Units 3x daily with meals Isolation Precautions Info: ESBL; Airborne; Contact precautions     Current Medications (08/31/2018):  This is the current hospital active medication list Current Facility-Administered Medications  Medication Dose Route Frequency Provider Last Rate Last Dose  . acetaminophen (TYLENOL) tablet 650 mg  650 mg Oral Q6H PRN Phillips Grout, MD   650 mg at 08/30/18 0828  . aspirin chewable tablet 81 mg  81 mg Oral Daily Charlynne Cousins, MD   81 mg at 08/31/18 1057  . clopidogrel (PLAVIX) tablet 75 mg  75 mg Oral Daily Charlynne Cousins, MD   75 mg at 08/31/18 1057  . escitalopram (LEXAPRO) tablet 10 mg  10 mg Oral Daily Charlynne Cousins, MD   10 mg at 08/31/18 1057  . fentaNYL (DURAGESIC) 50 MCG/HR 1 patch  1 patch Transdermal Q72H Charlynne Cousins, MD   1 patch at 08/30/18 [phone]  . fluticasone (FLONASE) 50 MCG/ACT nasal spray 2 spray  2 spray Each Nare Daily PRN Charlynne Cousins, MD      . gabapentin (NEURONTIN) capsule 600 mg  600 mg Oral BID Charlynne Cousins, MD   600 mg at 08/31/18 1057  . heparin injection 5,000 Units  5,000 Units Subcutaneous Q8H Charlynne Cousins, MD   5,000 Units at 08/31/18 0534  . HYDROmorphone (DILAUDID) tablet 2 mg  2 mg Oral Q4H PRN Charlynne Cousins, MD      . insulin aspart (novoLOG) injection 0-9 Units  0-9 Units Subcutaneous TID WC Ghimire, Shanker M, MD      . insulin glargine (LANTUS) injection 5 Units  5 Units Subcutaneous BID Charlynne Cousins, MD   5 Units at 08/31/18 1058  . isosorbide mononitrate (IMDUR) 24 hr tablet 30 mg  30 mg Oral Daily Charlynne Cousins, MD   30 mg at 08/31/18 1057  . magnesium oxide (MAG-OX) tablet 400 mg  400 mg Oral Daily Charlynne Cousins, MD   400 mg at 08/31/18 1059  . metoprolol succinate (TOPROL-XL) 24 hr tablet 25 mg  25 mg Oral Daily Charlynne Cousins, MD   25 mg at 08/31/18 1057  . nitroGLYCERIN (NITROSTAT) SL tablet 0.4 mg  0.4 mg  Sublingual Q5 min PRN Charlynne Cousins, MD      . ondansetron [facility]) 8 mg in sodium chloride 0.9 % 50 mL IVPB  8 mg Intravenous Q6H PRN Charlynne Cousins, MD 216 mL/hr at 08/27/18 0958 8 mg at 08/27/18 0958   Or  . ondansetron (ZOFRAN) tablet 4 mg  4 mg Oral Q6H PRN Charlynne Cousins, MD      . ondansetron (ZOFRAN-ODT) disintegrating tablet 4 mg  4 mg Oral Q8H PRN Charlynne Cousins, MD      . pantoprazole (PROTONIX) EC tablet 40 mg  40 mg Oral BID Charlynne Cousins, MD   40 mg at 08/31/18 1057  . promethazine (PHENERGAN) tablet 12.5 mg  12.5 mg Oral Q6H Charlynne Cousins, MD   12.5 mg at 08/31/18 0854  . sodium chloride flush (NS) 0.9 % injection 10-40 mL  10-40 mL Intracatheter Q12H Charlynne Cousins, MD   10 mL at 08/31/18 1100  . sodium chloride flush (NS) 0.9 % injection  10-40 mL  10-40 mL Intracatheter PRN Charlynne Cousins, MD         Discharge Medications: Please see discharge summary for a list of discharge medications.  Relevant Imaging Results:  Relevant Lab Results:   Additional Information SS# [id number]; COVID+ 08/17/2018  Evlyn Kanner [place], [place]

## 2018-08-31 NOTE — Plan of Care (Signed)
  Problem: Education: Goal: Knowledge of risk factors and measures for prevention of condition will improve Outcome: Progressing   Problem: Coping: Goal: Psychosocial and spiritual needs will be supported Outcome: Progressing   Problem: Respiratory: Goal: Will maintain a patent airway Outcome: Progressing Goal: Complications related to the disease process, condition or treatment will be avoided or minimized Outcome: Progressing   

## 2018-09-01 LAB — COMPREHENSIVE METABOLIC PANEL
ALT: 14 U/L (ref 0–44)
AST: 33 U/L (ref 15–41)
Albumin: 2.4 g/dL — ABNORMAL LOW (ref 3.5–5.0)
Alkaline Phosphatase: 47 U/L (ref 38–126)
Anion gap: 9 (ref 5–15)
BUN: 17 mg/dL (ref 8–23)
CO2: 23 mmol/L (ref 22–32)
Calcium: 9.4 mg/dL (ref 8.9–10.3)
Chloride: 100 mmol/L (ref 98–111)
Creatinine, Ser: 0.99 mg/dL (ref 0.44–1.00)
GFR calc Af Amer: >60 mL/min (ref >60)
GFR calc non Af Amer: 55 mL/min — ABNORMAL LOW (ref >60)
Glucose, Bld: 75 mg/dL (ref 70–99)
Potassium: 4 mmol/L (ref 3.5–5.1)
Sodium: 132 mmol/L — ABNORMAL LOW (ref 135–145)
Total Bilirubin: 0.4 mg/dL (ref 0.3–1.2)
Total Protein: 6.2 g/dL — ABNORMAL LOW (ref 6.5–8.1)

## 2018-09-01 LAB — CBC
HCT: 30.8 % — ABNORMAL LOW (ref 36.0–46.0)
Hemoglobin: 9.7 g/dL — ABNORMAL LOW (ref 12.0–15.0)
MCH: 24.6 pg — ABNORMAL LOW (ref 26.0–34.0)
MCHC: 31.5 g/dL (ref 30.0–36.0)
MCV: 78 fL — ABNORMAL LOW (ref 80.0–100.0)
Platelets: 319 10*3/uL (ref 150–400)
RBC: 3.95 MIL/uL (ref 3.87–5.11)
RDW: 14.8 % (ref 11.5–15.5)
WBC: 8.4 10*3/uL (ref 4.0–10.5)
nRBC: 0 % (ref 0.0–0.2)

## 2018-09-01 LAB — GLUCOSE, CAPILLARY
Glucose-Capillary: 81 mg/dL (ref 70–99)
Glucose-Capillary: 88 mg/dL (ref 70–99)
Glucose-Capillary: 144 mg/dL — ABNORMAL HIGH (ref 70–99)
Glucose-Capillary: 197 mg/dL — ABNORMAL HIGH (ref 70–99)

## 2018-09-01 LAB — C-REACTIVE PROTEIN: CRP: 17.7 mg/dL — ABNORMAL HIGH (ref <1.0)

## 2018-09-01 LAB — FERRITIN: Ferritin: 151 ng/mL (ref 11–307)

## 2018-09-01 LAB — D-DIMER, QUANTITATIVE: D-Dimer, Quant: 2.53 ug/mL-FEU — ABNORMAL HIGH (ref 0.00–0.50)

## 2018-09-01 MED ORDER — POLYETHYLENE GLYCOL 3350 17 G PO PACK
17.0000 g | PACK | Freq: Every day | ORAL | Status: DC
  Administered 2018-09-01: 17 g via ORAL
  Filled 2018-09-01 (×4): qty 1

## 2018-09-01 MED ORDER — DEXAMETHASONE 6 MG PO TABS
6.0000 mg | ORAL_TABLET | Freq: Every day | ORAL | Status: DC
  Administered 2018-09-01 – 2018-09-03 (×3): 6 mg via ORAL
  Filled 2018-09-01 (×3): qty 1

## 2018-09-01 NOTE — Progress Notes (Signed)
Physical Therapy Treatment Patient Details Name: [name] MRN: [id number] DOB: 1940-11-08 Today's Date: 09/01/2018    History of Present Illness Pt is a 78 y.o. female with PMH significant for Anxiety, Arthritis, CAD, CHF, Chronic pain, HTN, MI (08/2015), Multiple myeloma (dx'd 2015), PAD, Stroke, and DM2; positive SARS-CoV-2 on 08/17/2018,experiencing severe nausea without vomiting, decreased activity tolerance, fatigue.    PT Comments    Pt remains excessively flat.  She did not vomit during our session and was difficult to keep aroused (eyes closed, not responding to me much).  All VSS and checked throughout on RA.  She is very weak, eating very little and just does not seem to have any drive to get better.  She is too weak to walk at this point, but did get up to the [facility] and the chair with me.  PT will continue to follow acutely for safe mobility progression  Follow Up Recommendations  SNF     Equipment Recommendations  None recommended by PT    Recommendations for Other Services   NA     Precautions / Restrictions Precautions Precautions: Fall Precaution Comments: very weak    Mobility  Bed Mobility Overal bed mobility: Needs Assistance Bed Mobility: Supine to Sit Rolling: Mod assist   Supine to sit: Mod assist     General bed mobility comments: Mod assist mostly to support trunk to come to sitting EOB.  Pt able to slowly move bil LEs over the side with cues for completing the task.   Transfers Overall transfer level: Needs assistance Equipment used: Rolling walker (2 wheeled) Transfers: Sit to/from [facility] Sit to Stand: Min assist Stand pivot transfers: Min assist       General transfer comment: Min assist to help pt power up to standing and stabilize on RW cues for hand placement on top rung of RW.  Min assist to stabilize for balance and help move RW around to get to [facility] and then to recliner chair.    Ambulation/Gait              General Gait Details: too weak          Balance Overall balance assessment: Needs assistance Sitting-balance support: Feet supported;Bilateral upper extremity supported Sitting balance-Leahy Scale: Poor Sitting balance - Comments: pt falling to the right, eyes closed most of session, min assist EOB for sitting balance.    Standing balance support: Bilateral upper extremity supported Standing balance-Leahy Scale: Poor Standing balance comment: needs external support and RW for standing.  Total assist pericare.                             Cognition Arousal/Alertness: Awake/alert Behavior During Therapy: Flat affect Overall Cognitive Status: No family/caregiver present to determine baseline cognitive functioning Area of Impairment: Following commands;Safety/judgement;Attention                   Current Attention Level: Sustained   Following Commands: Follows one step commands with increased time Safety/Judgement: Decreased awareness of safety;Decreased awareness of deficits Awareness: Emergent   General Comments: Pt was slow to process, overly flat, did not seem to think that folding herself in half on the bedside commode was something to be worried about.  I had to be quite aggressive to get her to respond to me.           General Comments General comments (skin integrity, edema, etc.): HR, BP and O2  sats checked on RA and all were stable.       Pertinent Vitals/Pain Pain Assessment: Faces Faces Pain Scale: Hurts even more Pain Location: abdomen Pain Descriptors / Indicators: Grimacing;Guarding Pain Intervention(s): Limited activity within patient's tolerance;Monitored during session;Repositioned           PT Goals (current goals can now be found in the care plan section) Acute Rehab PT Goals Patient Stated Goal: did not state Progress towards PT goals: Progressing toward goals    Frequency    Min 3X/week      PT Plan Current plan  remains appropriate       AM-PAC PT "6 Clicks" Mobility   Outcome Measure  Help needed turning from your back to your side while in a flat bed without using bedrails?: A Little Help needed moving from lying on your back to sitting on the side of a flat bed without using bedrails?: A Lot Help needed moving to and from a bed to a chair (including a wheelchair)?: A Little Help needed standing up from a chair using your arms (e.g., wheelchair or bedside chair)?: A Little Help needed to walk in hospital room?: A Lot Help needed climbing 3-5 steps with a railing? : Total 6 Click Score: 14    End of Session   Activity Tolerance: Patient limited by fatigue Patient left: in chair;with call bell/phone within reach Nurse Communication: Mobility status PT Visit Diagnosis: Muscle weakness (generalized) (M62.81);Difficulty in walking, not elsewhere classified (R26.2);Pain Pain - Right/Left: (abdomen) Pain - part of body: (abdomen)     Time: 0350-0938 PT Time Calculation (min) (ACUTE ONLY): 23 min  Charges:  $Therapeutic Activity: 23-37 mins            Jillyan Plitt B. Gerardine Peltz, PT, DPT  Acute Rehabilitation [phone] pager [phone] office  @ [facility]: [phone]            09/01/2018, 2:18 PM

## 2018-09-01 NOTE — Progress Notes (Signed)
Family updated today per MD note

## 2018-09-01 NOTE — Progress Notes (Addendum)
PROGRESS NOTE                                                                                                                                                                                                             Patient Demographics:    [name], is a 78 y.o. female, DOB - 1941-01-11, SRP:[id number]  Outpatient Primary MD for the patient is Clemmie Krill Lynnell Jude, MD   Admit date - 08/27/2018   LOS - 4  No chief complaint on file.      Brief Narrative: Patient is a 78 y.o. female with PMHx of chronic diastolic heart failure, CAD s/p CABG 2017, PAD s/p left fempo bypass in 2013, history of multiple myeloma in remission, HTN, DM-2, s/p lap band bariatric surgery 2007, chronic pain-presented with vomiting-she was recently diagnosed with COVID-19 on 8/6.  She was subsequently admitted to the hospitalist service.     Subjective:    Tuere Roig knee nausea vomiting this morning.  Per RN-has weakness and poor appetite-but no major issues overnight.  Per patient-her last bowel movement was 2 days back.   Assessment  & Plan :   Intractable nausea and vomiting: Has slowly improved over the past few days-appears to have some amount of intermittent/chronic nausea at baseline-per patient she does occasionally vomit several times a week at home.  She is s/p lap band surgery-not sure if she has some amount of gastroparesis at baseline that could have worsened due to viral illness.  Continue supportive care.    COVID-19 pneumonia: Remains stable on room air, inflammatory markers are still significantly elevated.  We will go and start her on steroids today-monitor closely, if she develops hypoxemia-needs Remdesivir.    Fever: afebrile  O2 requirements: None  COVID-19 Labs: Recent Labs    08/30/18 0420 08/31/18 0345 09/01/18 0445  DDIMER 1.85* 2.45* 2.53*  FERRITIN 124 136 151  LDH 180  --   --   CRP 17.0* 17.7* 17.7*   COVID-19  Medications: Steroids: Decadron 8/21>> Remdesivir:None Actemra:None Convalescent Plasma:N/A Research Studies:N/A  Other medications: Diuretics: Euvolemic-no indication for diuretics  Antibiotics:Not needed as no evidence of bacterial infection  3)  °Recent Labs  °  08/31/18 °1650 08/31/18 °2108 09/01/18 °0748  °GLUCAP 83 78 81  ° ° °History of PAD/CAD s/p CABG: Currently asymptomatic-continue antiplatelets. ° °Asymptomatic bacteriuria: With no symptoms of UTI (denies dysuria or frequency)-UA essentially unremarkable-however urine culture drawn in the emergency room on 8/15 shows ESBL E. coli.  Since thought to have asymptomatic bacteriuria rather than UTI-no indications to start antibiotics ° °Deconditioning/debility/failure to thrive syndrome: Continues to have weakness-poor appetite-although not hypoxic-inflammatory markers are going up-we will see if a burst of a short course of steroids will help.  Family contemplating SNF on discharge-social worker following ° °ABG: °   °Component Value Date/Time  ° TCO2 22 03/05/2010 1028  ° ° °Vent Settings: N/A ° °Condition - Stable ° °Family Communication  :  Son updated over the phone on 8/21-explained that patient has been relatively stable-no oxygen requirements, vitals stable-and she is weak and debilitated and probably requires SNF rehab.  Son and social worker are currently in discussion-we will await further input from social work. ° °Code Status :  Full Code ° °Diet :  °Diet Order   °       °  Diet Carb Modified Fluid consistency: Thin; Room service appropriate? Yes  Diet effective now    °  °  °  °  °  ° °Disposition Plan  : SNF-social worker following.  Discussion with family ongoing. ° °Consults  :  None ° °Procedures  :  None ° °Antibiotics  :     ° °Anti-infectives (From admission, onward)  ° None  °  ° ° °Inpatient Medications ° °Scheduled Meds: °• aspirin  81 mg Oral Daily  °• clopidogrel  75 mg Oral Daily  °• escitalopram  10 mg Oral Daily  °• fentaNYL  1 patch Transdermal Q72H  °• gabapentin  600 mg Oral BID  °• heparin  5,000 Units Subcutaneous Q8H  °• insulin aspart  0-9 Units Subcutaneous TID WC  °• insulin glargine  5 Units Subcutaneous BID  °• isosorbide mononitrate  30 mg Oral Daily  °• magnesium oxide  400 mg Oral Daily  °• metoprolol succinate  25 mg Oral Daily  °• pantoprazole  40 mg Oral BID  °• promethazine  12.5 mg Oral Q6H  °• sodium chloride flush  10-40 mL Intracatheter Q12H  ° °Continuous Infusions: °• ondansetron (ZOFRAN) IV 8 mg (08/27/18 0958)  ° °PRN Meds:.acetaminophen, fluticasone, HYDROmorphone, nitroGLYCERIN, ondansetron **OR** ondansetron (ZOFRAN) IV, ondansetron, sodium chloride flush ° ° Time Spent in minutes  25 ° °See all Orders from today for further details ° ° °  M.D on 09/01/2018 at 11:20 AM ° °To page go to [email or web address] - use universal password ° °[facility] -  Office  [phone] ° ° ° Objective:  ° °Vitals:  ° 08/31/18 1600 08/31/18 1911 09/01/18 0411 09/01/18 0741  °BP: (!) 168/50 (!) 142/53 (!) 168/58 (!) 168/73  °Pulse: 76 67 79 89  °Resp:  16 17 18  °Temp: 98.2 °F (36.8 °C) 97.9 °F (36.6 °C) 98.1 °F (36.7 °C) 98.4 °F (36.9 °C)  °TempSrc: Oral Oral Oral Oral  °SpO2: 96% 95% 95% 96%  °Weight:      °Height:      ° ° °Wt Readings from Last 3 Encounters:  °08/28/18 70.3 kg  °08/23/18 68 kg  °01/11/18 72.6 kg  ° ° ° °Intake/Output Summary (Last 24 hours) at 09/01/2018 1120 °Last data filed at 09/01/2018 1000 °Gross per 24 hour  °Intake   100 ml  °Output --  °Net 100 ml  ° ° ° °Physical Exam °Gen Exam:Alert awake-not in any distress.  Looks very frail and weak °HEENT:atraumatic, normocephalic °Chest: B/L clear to auscultation anteriorly °CVS:S1S2 regular °Abdomen:soft non tender, non  distended °Extremities:no edema °Neurology: Non focal °Skin: no rash ° ° Data Review:  ° ° °CBC °Recent Labs  °Lab 08/26/18 °2006 08/28/18 °0610 08/30/18 °0842 08/31/18 °0345 09/01/18 °0445  °WBC 8.0 7.5 7.3 8.3 8.4  °HGB 11.4* 10.2* 9.8* 10.0* 9.7*  °HCT 35.7* 32.5* 31.7* 32.0* 30.8*  °PLT 240 226 250 292 319  °MCV 76.9* 80.0 79.1* 78.4* 78.0*  °MCH 24.6* 25.1* 24.4* 24.5* 24.6*  °MCHC 31.9 31.4 30.9 31.3 31.5  °RDW 14.7 14.7 14.6 14.6 14.8  ° ° °Chemistries  °Recent Labs  °Lab 08/26/18 °2006 08/27/18 °0930 08/28/18 °0610 08/30/18 °0842 08/31/18 °0345 09/01/18 °0445  °NA 130*  --  135 132* 131* 132*  °K 3.9  --  3.6 3.7 3.7 4.0  °CL 94*  --  103 101 98 100  °CO2 22  --  24 24 21* 23  °GLUCOSE 128*  --  92 79 91 75  °BUN 28*  --  21 17 18 17  °CREATININE 1.36*  --  1.18* 1.04* 1.07* 0.99  °CALCIUM 8.9  --  8.6* 9.1 9.1 9.4  °MG  --  1.6*  --   --   --   --   °AST 28  --  21 21 23 33  °ALT 11  --  10 9 12 14  °ALKPHOS 65  --  52 52 50 47  °BILITOT 0.8  --  0.6 0.5 0.5 0.4  ° °------------------------------------------------------------------------------------------------------------------ °No results for input(s): CHOL, HDL, LDLCALC, TRIG, CHOLHDL, LDLDIRECT in the last 72 hours. ° °Lab Results  °Component Value Date  ° HGBA1C 7.9 (H) 08/27/2018  ° °------------------------------------------------------------------------------------------------------------------ °No results for input(s): TSH, T4TOTAL, T3FREE, THYROIDAB in the last 72 hours. ° °Invalid input(s): FREET3 °------------------------------------------------------------------------------------------------------------------ °Recent Labs  °  08/31/18 °0345 09/01/18 °0445  °FERRITIN 136 151  ° ° °Coagulation profile °No results for input(s): INR, PROTIME in the last 168 hours. ° °Recent Labs  °  08/31/18 °0345 09/01/18 °0445  °DDIMER 2.45* 2.53*  ° ° °Cardiac Enzymes °No results for input(s): CKMB, TROPONINI, MYOGLOBIN in the last 168 hours. ° °Invalid  input(s): CK °------------------------------------------------------------------------------------------------------------------ °   °Component Value Date/Time  ° BNP 438.8 (H) 01/13/2016 0836  ° ° °Micro Results °Recent Results (from the past 240 hour(s))  °Urine culture     Status: Abnormal  ° Collection Time: 08/26/18 11:02 PM  ° Specimen: Urine, Random  °Result Value Ref Range Status  ° Specimen Description   Final  °  URINE, RANDOM °Performed at [facility], [address] [place], [place] [address] °  ° Special Requests   Final  °  NONE °Performed at [facility], [address] [place], [place] [address] °  ° Culture (A)  Final  °  >=100,000 COLONIES/mL ESCHERICHIA COLI °Confirmed Extended Spectrum Beta-Lactamase Producer (ESBL).  In bloodstream infections from ESBL organisms, carbapenems are preferred over piperacillin/tazobactam. They are shown to have a lower risk of mortality. °  ° Report Status 08/29/2018 FINAL  Final  ° Organism ID, Bacteria ESCHERICHIA COLI (A)  Final  °    Susceptibility  ° Escherichia coli - MIC*  °  AMPICILLIN >=32 RESISTANT Resistant   °  CEFAZOLIN >=64 RESISTANT Resistant   °  CEFTRIAXONE >=64   RESISTANT Resistant   °  CIPROFLOXACIN >=4 RESISTANT Resistant   °  GENTAMICIN <=1 SENSITIVE Sensitive   °  IMIPENEM <=0.25 SENSITIVE Sensitive   °  NITROFURANTOIN <=16 SENSITIVE Sensitive   °  TRIMETH/SULFA <=20 SENSITIVE Sensitive   °  AMPICILLIN/SULBACTAM 8 SENSITIVE Sensitive   °  PIP/TAZO <=4 SENSITIVE Sensitive   °  Extended ESBL POSITIVE Resistant   °  * >=100,000 COLONIES/mL ESCHERICHIA COLI  ° ° °Radiology Reports °Ct Head Wo Contrast ° °Result Date: 08/26/2018 °CLINICAL DATA:  Altered mental status. Nausea.  COVID-19 positive EXAM: CT HEAD WITHOUT CONTRAST TECHNIQUE: Contiguous axial images were obtained from the base of the skull through the vertex without intravenous contrast. COMPARISON:  Head CT 10/06/2016 FINDINGS: Brain: Patient had difficulty tolerating  the exam, mild motion artifact. No intracranial hemorrhage, mass effect, or midline shift. No hydrocephalus. The basilar cisterns are patent. No evidence of territorial infarct or acute ischemia. No extra-axial or intracranial fluid collection. Unchanged degree of generalized atrophy. Mild chronic small vessel ischemia. Vascular: Atherosclerosis of skullbase vasculature without hyperdense vessel or abnormal calcification. Skull: No fracture or focal lesion. Sinuses/Orbits: Mucosal thickening of ethmoid air cells. No fluid levels. Mastoid air cells are clear. Bilateral cataract resection. Other: None. IMPRESSION: 1. No acute intracranial abnormality. 2. Unchanged atrophy and chronic small vessel ischemia. Electronically Signed   By: Melanie  Sanford M.D.   On: 08/26/2018 23:39  ° °Dg Chest Portable 1 View ° °Result Date: 08/26/2018 °CLINICAL DATA:  Nausea. Poor p.o. intake. Vomiting. Diagnosed with COVID-19 5 days ago. EXAM: PORTABLE CHEST 1 VIEW COMPARISON:  08/23/2018. FINDINGS: Normal sized heart. Mildly tortuous and calcified thoracic aorta. Stable mild diffuse peribronchial thickening and accentuation of the interstitial markings. Interval minimal patchy opacity in the lateral aspect of the right mid upper lung zone. Mild linear densities in both lungs with mild progression. Stable post CABG changes, lower thoracic spine degenerative changes and diffuse osteopenia. A stat after a IMPRESSION: 1. Interval minimal patchy opacity in right mid upper lung zone laterally. This may represent early lung changes due to the patient's COVID-19. 2. Mildly progressive linear atelectasis in both lungs. 3. Stable mild chronic bronchitic changes. Electronically Signed   By: [name]  Reid M.D.   On: 08/26/2018 21:49  ° °Dg Chest Portable 1 View ° °Result Date: 08/23/2018 °CLINICAL DATA:  Nausea.  COVID-19 positive. EXAM: PORTABLE CHEST 1 VIEW COMPARISON:  January 11, 2018 FINDINGS: The heart size and mediastinal contours are stable.  Both lungs are clear. The visualized skeletal structures are stable. IMPRESSION: No acute cardiopulmonary disease identified. Electronically Signed   By: Wei-Chen  Lin M.D.   On: 08/23/2018 08:39  ° °Dg Abd 2 Views ° °Result Date: 08/26/2018 °CLINICAL DATA:  Nausea. Poor oral intake. Diagnosed with COVID-19 5 days ago. Three episodes of vomiting today. EXAM: ABDOMEN - 2 VIEW COMPARISON:  10/04/2016. FINDINGS: Normal bowel gas pattern. No significant stool. Stable gastric band. Interval 4 mm left mid abdominal calcification. Stable thoracolumbar scoliosis and degenerative changes. Atheromatous arterial calcifications without visible aneurysm. Post CABG changes. Bilateral proximal thigh vascular stents. IMPRESSION: 1. No acute abnormality. 2. Interval 4 mm left mid abdominal calcification. This could represent a phlebolith or urinary tract calcification. Electronically Signed   By: [name]  Reid M.D.   On: 08/26/2018 21:45  ° ° °

## 2018-09-01 NOTE — Care Management (Signed)
Case manager contacted  [name], Admissions coordinator at [facility] at request of patient's son, [name] They do not have an open bed and they are not accepting COVID patients. CM updated patient's son with this, he wants to speak with Dr. Concerning his mom before making decision to have patient faxed out. CM contacted Dr. Sloan Leiter.  Will continue to follow.   Ricki Miller, RN BSN Case Manager [phone]

## 2018-09-02 LAB — COMPREHENSIVE METABOLIC PANEL
ALT: 22 U/L (ref 0–44)
AST: 42 U/L — ABNORMAL HIGH (ref 15–41)
Albumin: 2.6 g/dL — ABNORMAL LOW (ref 3.5–5.0)
Alkaline Phosphatase: 53 U/L (ref 38–126)
Anion gap: 12 (ref 5–15)
BUN: 28 mg/dL — ABNORMAL HIGH (ref 8–23)
CO2: 22 mmol/L (ref 22–32)
Calcium: 10.2 mg/dL (ref 8.9–10.3)
Chloride: 97 mmol/L — ABNORMAL LOW (ref 98–111)
Creatinine, Ser: 0.98 mg/dL (ref 0.44–1.00)
GFR calc Af Amer: >60 mL/min (ref >60)
GFR calc non Af Amer: 56 mL/min — ABNORMAL LOW (ref >60)
Glucose, Bld: 203 mg/dL — ABNORMAL HIGH (ref 70–99)
Potassium: 4.5 mmol/L (ref 3.5–5.1)
Sodium: 131 mmol/L — ABNORMAL LOW (ref 135–145)
Total Bilirubin: 0.8 mg/dL (ref 0.3–1.2)
Total Protein: 6.8 g/dL (ref 6.5–8.1)

## 2018-09-02 LAB — CBC
HCT: 33.3 % — ABNORMAL LOW (ref 36.0–46.0)
Hemoglobin: 10.4 g/dL — ABNORMAL LOW (ref 12.0–15.0)
MCH: 24.2 pg — ABNORMAL LOW (ref 26.0–34.0)
MCHC: 31.2 g/dL (ref 30.0–36.0)
MCV: 77.4 fL — ABNORMAL LOW (ref 80.0–100.0)
Platelets: 332 10*3/uL (ref 150–400)
RBC: 4.3 MIL/uL (ref 3.87–5.11)
RDW: 14.9 % (ref 11.5–15.5)
WBC: 6.9 10*3/uL (ref 4.0–10.5)
nRBC: 0 % (ref 0.0–0.2)

## 2018-09-02 LAB — GLUCOSE, CAPILLARY
Glucose-Capillary: 180 mg/dL — ABNORMAL HIGH (ref 70–99)
Glucose-Capillary: 219 mg/dL — ABNORMAL HIGH (ref 70–99)
Glucose-Capillary: 395 mg/dL — ABNORMAL HIGH (ref 70–99)
Glucose-Capillary: 356 mg/dL — ABNORMAL HIGH (ref 70–99)

## 2018-09-02 LAB — C-REACTIVE PROTEIN: CRP: 15.2 mg/dL — ABNORMAL HIGH (ref <1.0)

## 2018-09-02 LAB — FERRITIN: Ferritin: 221 ng/mL (ref 11–307)

## 2018-09-02 LAB — D-DIMER, QUANTITATIVE: D-Dimer, Quant: 2.25 ug/mL-FEU — ABNORMAL HIGH (ref 0.00–0.50)

## 2018-09-02 MED ORDER — ONDANSETRON HCL 4 MG/2ML IJ SOLN: 4.0000 mg | Freq: Four times a day (QID) | INTRAMUSCULAR | Status: DC | PRN

## 2018-09-02 MED ORDER — ONDANSETRON 4 MG PO TBDP
4.0000 mg | ORAL_TABLET | Freq: Three times a day (TID) | ORAL | Status: DC
  Administered 2018-09-02: 09:00:00 4 mg via ORAL
  Filled 2018-09-02 (×3): qty 1

## 2018-09-02 MED ORDER — PROMETHAZINE HCL 25 MG/ML IJ SOLN
12.5000 mg | Freq: Four times a day (QID) | INTRAMUSCULAR | Status: DC | PRN
  Administered 2018-09-02: 12.5 mg via INTRAVENOUS
  Filled 2018-09-02: qty 1

## 2018-09-02 NOTE — TOC Progression Note (Signed)
Transition of Care [facility]) - Progression Note    Patient Details  Name: Ripley Reigle Micheli MRN: [id number] Date of Birth: 09-25-1940  [facility]) CM/SW Contact  Shade Flood, LCSW Phone Number: 09/02/2018, 1:02 PM  Clinical Narrative:     TOC following. Spoke with pt's son by phone today to follow up on whether he would like SNF referrals sent out. Son states that he is still waiting to speak with a physician from [facility] to determine their recommendation. At son's request, emailed him the names of the facilities locally that are accepting pt's diagnosed with COVID along with the CMS NH compare website link.  TOC will follow and continue to assist with pt's dc planning needs.       Expected Discharge Plan and Services                                                 Social Determinants of Health (SDOH) Interventions    Readmission Risk Interventions No flowsheet data found.

## 2018-09-02 NOTE — Progress Notes (Signed)
PROGRESS NOTE                                                                                                                                                                                                             Patient Demographics:    [name], is a 78 y.o. female, DOB - 07/28/1940, ZDG:[id number]  Outpatient Primary MD for the patient is Clemmie Krill Lynnell Jude, MD   Admit date - 08/27/2018   LOS - 5  No chief complaint on file.      Brief Narrative: Patient is a 78 y.o. female with PMHx of chronic diastolic heart failure, CAD s/p CABG 2017, PAD s/p left fempo bypass in 2013, history of multiple myeloma in remission, HTN, DM-2, s/p lap band bariatric surgery 2007, chronic pain-presented with vomiting-she was recently diagnosed with COVID-19 on 8/6.  She was subsequently admitted to the hospitalist service.     Subjective:    [name] was lying in bed comfortably when I walked in.  She is awake and alert-denies any nausea vomiting this morning.  Claims she ate some significant portion of her dinner last night.   Assessment  & Plan :   Intractable nausea and vomiting: Slowly improved over the past few days-appears to have some amount of chronic intermittent nausea at baseline.  She is s/p lap band surgery-could have some subtle gastroparesis at baseline that may have worsened with acute viral illness.  Continue antiemetics and other supportive care.    COVID-19 pneumonia: She remains very stable on room air-we have added on a short course of steroids given persistently awaited inflammatory markers and severe failure to thrive symptoms.  Continue to monitor closely-if she develops hypoxemia-she may need to be started on Remdesivir.   Fever: afebrile  O2 requirements: None  COVID-19 Labs: Recent Labs    08/31/18 0345 09/01/18 0445 09/02/18 0400  DDIMER 2.45* 2.53* 2.25*  FERRITIN 136 151 221  CRP 17.7* 17.7* 15.2*     COVID-19 Medications: Steroids: Decadron 8/21>> Remdesivir:None Actemra:None Convalescent Plasma:N/A Research Studies:N/A  Other medications: Diuretics: Euvolemic-no indication for diuretics  Antibiotics:Not needed as no evidence of bacterial infection  DVT Prophylaxis  :  Heparin  Hypertension: Controlled-new Imdur and metoprolol.    DM-2: CBGs stable with SSI.  No longer on Lantus as she was borderline hypoglycemic.  CBG (last 3)  Recent Labs    09/01/18 1645 09/01/18 2118 09/02/18 0714  GLUCAP 144* 197* 180*    History of PAD/CAD s/p CABG: Currently asymptomatic-continue antiplatelets.  Asymptomatic bacteriuria: With no symptoms of UTI (denies dysuria or frequency)-UA essentially unremarkable-however urine culture drawn in the emergency room on 8/15 shows ESBL E. coli.  Since thought to have asymptomatic bacteriuria rather than UTI-no indications to start antibiotics  Deconditioning/debility/failure to thrive syndrome: Continues to exhibit failure to thrive syndrome-have encouraged patient to ambulate with PT, out of bed to chair-but seems to have perked up a little bit after starting her on steroids-we will plan a few day course.  PT following-with recommendations for SNF-social worker following with family.  ABG:    Component Value Date/Time   TCO2 22 03/05/2010 1028    Vent Settings: N/A  Condition - Stable  Family Communication  : Left voicemail for patient's son  Code Status :  Full Code  Diet :  Diet Order            Diet Carb Modified Fluid consistency: Thin; Room service appropriate? Yes  Diet effective now               Disposition Plan  : SNF-social worker following-awaiting bed/discussion with family  Consults  :  None  Procedures  :  None  Antibiotics  :    Anti-infectives (From admission, onward)   None      Inpatient Medications  Scheduled Meds:  aspirin  81 mg Oral Daily   clopidogrel  75 mg Oral Daily   dexamethasone   6 mg Oral Q breakfast   escitalopram  10 mg Oral Daily   fentaNYL  1 patch Transdermal Q72H   gabapentin  600 mg Oral BID   heparin  5,000 Units Subcutaneous Q8H   insulin aspart  0-9 Units Subcutaneous TID WC   isosorbide mononitrate  30 mg Oral Daily   magnesium oxide  400 mg Oral Daily   metoprolol succinate  25 mg Oral Daily   ondansetron  4 mg Oral TID AC   pantoprazole  40 mg Oral BID   polyethylene glycol  17 g Oral Daily   sodium chloride flush  10-40 mL Intracatheter Q12H   Continuous Infusions:  PRN Meds:.acetaminophen, fluticasone, HYDROmorphone, nitroGLYCERIN, ondansetron (ZOFRAN) IV, promethazine, sodium chloride flush   Time Spent in minutes  25  See all Orders from today for further details   Oren Binet M.D on 09/02/2018 at 10:50 AM  To page go to [email or web address] - use universal password  [facility] -  Office  [phone]    Objective:   Vitals:   09/01/18 1522 09/01/18 1951 09/02/18 0317 09/02/18 0716  BP: (!) 125/54 (!) 124/45  (!) 143/57  Pulse: 75   (!) 56  Resp: _0 Temp: 98.3 F (36.8 C) 98.1 F (36.7 C) (!) 97.5 F (36.4 C) (!) 97 F (36.1 C)  TempSrc: Oral  Oral Oral  SpO2: 95%   95%  Weight:      Height:        Wt Readings from Last 3 Encounters:  08/28/18 70.3 kg  08/23/18 68 kg  01/11/18 72.6 kg     Intake/Output Summary (Last 24 hours) at 09/02/2018 1050 Last data filed at 09/01/2018 2121 Gross per 24 hour  Intake 243 ml  Output 1 ml  Net 242 ml     Physical Exam Gen Exam:Alert awake-not in any distress.  Looks frail and chronically sick  appearing HEENT:atraumatic, normocephalic Chest: B/L clear to auscultation anteriorly CVS:S1S2 regular Abdomen:soft non tender, non distended Extremities:no edema Neurology: Non focal Skin: no rash   Data Review:    CBC Recent Labs  Lab 08/28/18 0610 08/30/18 0842 08/31/18 0345 09/01/18 0445 09/02/18 0400  WBC 7.5 7.3 8.3 8.4 6.9  HGB 10.2*  9.8* 10.0* 9.7* 10.4*  HCT 32.5* 31.7* 32.0* 30.8* 33.3*  PLT 226 250 292 319 332  MCV 80.0 79.1* 78.4* 78.0* 77.4*  MCH 25.1* 24.4* 24.5* 24.6* 24.2*  MCHC 31.4 30.9 31.3 31.5 31.2  RDW 14.7 14.6 14.6 14.8 14.9    Chemistries  Recent Labs  Lab 08/27/18 0930 08/28/18 0610 08/30/18 0842 08/31/18 0345 09/01/18 0445 09/02/18 0400  NA  --  135 132* 131* 132* 131*  K  --  3.6 3.7 3.7 4.0 4.5  CL  --  103 101 98 100 97*  CO2  --  24 24 21* 23 22  GLUCOSE  --  92 79 91 75 203*  BUN  --  _0 28*  CREATININE  --  1.18* 1.04* 1.07* 0.99 0.98  CALCIUM  --  8.6* 9.1 9.1 9.4 10.2  MG 1.6*  --   --   --   --   --   AST  --  _1 33 42*  ALT  --  _2 ALKPHOS  --  52 52 50 47 53  BILITOT  --  0.6 0.5 0.5 0.4 0.8   ------------------------------------------------------------------------------------------------------------------ No results for input(s): CHOL, HDL, LDLCALC, TRIG, CHOLHDL, LDLDIRECT in the last 72 hours.  Lab Results  Component Value Date   HGBA1C 7.9 (H) 08/27/2018   ------------------------------------------------------------------------------------------------------------------ No results for input(s): TSH, T4TOTAL, T3FREE, THYROIDAB in the last 72 hours.  Invalid input(s): FREET3 ------------------------------------------------------------------------------------------------------------------ Recent Labs    09/01/18 0445 09/02/18 0400  FERRITIN 151 221    Coagulation profile No results for input(s): INR, PROTIME in the last 168 hours.  Recent Labs    09/01/18 0445 09/02/18 0400  DDIMER 2.53* 2.25*    Cardiac Enzymes No results for input(s): CKMB, TROPONINI, MYOGLOBIN in the last 168 hours.  Invalid input(s): CK ------------------------------------------------------------------------------------------------------------------    Component Value Date/Time   BNP 438.8 (H) 01/13/2016 7517    Micro Results Recent Results (from  the past 240 hour(s))  Urine culture     Status: Abnormal   Collection Time: 08/26/18 11:02 PM   Specimen: Urine, Random  Result Value Ref Range Status   Specimen Description   Final    URINE, RANDOM Performed at [facility], [address], [place], [place] [address]    Special Requests   Final    NONE Performed at [facility], [address] [place], [place] [address]    Culture (A)  Final    >=100,000 COLONIES/mL ESCHERICHIA COLI Confirmed Extended Spectrum Beta-Lactamase Producer (ESBL).  In bloodstream infections from ESBL organisms, carbapenems are preferred over piperacillin/tazobactam. They are shown to have a lower risk of mortality.    Report Status 08/29/2018 FINAL  Final   Organism ID, Bacteria ESCHERICHIA COLI (A)  Final      Susceptibility   Escherichia coli - MIC*    AMPICILLIN >=32 RESISTANT Resistant     CEFAZOLIN >=64 RESISTANT Resistant     CEFTRIAXONE >=64 RESISTANT Resistant     CIPROFLOXACIN >=4 RESISTANT Resistant     GENTAMICIN <=1 SENSITIVE Sensitive     IMIPENEM <=0.25 SENSITIVE Sensitive  NITROFURANTOIN <=16 SENSITIVE Sensitive     TRIMETH/SULFA <=20 SENSITIVE Sensitive     AMPICILLIN/SULBACTAM 8 SENSITIVE Sensitive     PIP/TAZO <=4 SENSITIVE Sensitive     Extended ESBL POSITIVE Resistant     * >=100,000 COLONIES/mL ESCHERICHIA COLI    Radiology Reports Ct Head Wo Contrast  Result Date: 08/26/2018 CLINICAL DATA:  Altered mental status. Nausea.  COVID-19 positive EXAM: CT HEAD WITHOUT CONTRAST TECHNIQUE: Contiguous axial images were obtained from the base of the skull through the vertex without intravenous contrast. COMPARISON:  Head CT 10/06/2016 FINDINGS: Brain: Patient had difficulty tolerating the exam, mild motion artifact. No intracranial hemorrhage, mass effect, or midline shift. No hydrocephalus. The basilar cisterns are patent. No evidence of territorial infarct or acute ischemia. No extra-axial or intracranial fluid  collection. Unchanged degree of generalized atrophy. Mild chronic small vessel ischemia. Vascular: Atherosclerosis of skullbase vasculature without hyperdense vessel or abnormal calcification. Skull: No fracture or focal lesion. Sinuses/Orbits: Mucosal thickening of ethmoid air cells. No fluid levels. Mastoid air cells are clear. Bilateral cataract resection. Other: None. IMPRESSION: 1. No acute intracranial abnormality. 2. Unchanged atrophy and chronic small vessel ischemia. Electronically Signed   By: Keith Rake M.D.   On: 08/26/2018 23:39   Dg Chest Portable 1 View  Result Date: 08/26/2018 CLINICAL DATA:  Nausea. Poor p.o. intake. Vomiting. Diagnosed with COVID-19 5 days ago. EXAM: PORTABLE CHEST 1 VIEW COMPARISON:  08/23/2018. FINDINGS: Normal sized heart. Mildly tortuous and calcified thoracic aorta. Stable mild diffuse peribronchial thickening and accentuation of the interstitial markings. Interval minimal patchy opacity in the lateral aspect of the right mid upper lung zone. Mild linear densities in both lungs with mild progression. Stable post CABG changes, lower thoracic spine degenerative changes and diffuse osteopenia. A stat after a IMPRESSION: 1. Interval minimal patchy opacity in right mid upper lung zone laterally. This may represent early lung changes due to the patient's COVID-19. 2. Mildly progressive linear atelectasis in both lungs. 3. Stable mild chronic bronchitic changes. Electronically Signed   By: Claudie Revering M.D.   On: 08/26/2018 21:49   Dg Chest Portable 1 View  Result Date: 08/23/2018 CLINICAL DATA:  Nausea.  COVID-19 positive. EXAM: PORTABLE CHEST 1 VIEW COMPARISON:  January 11, 2018 FINDINGS: The heart size and mediastinal contours are stable. Both lungs are clear. The visualized skeletal structures are stable. IMPRESSION: No acute cardiopulmonary disease identified. Electronically Signed   By: Abelardo Diesel M.D.   On: 08/23/2018 08:39   Dg Abd 2 Views  Result Date:  08/26/2018 CLINICAL DATA:  Nausea. Poor oral intake. Diagnosed with COVID-19 5 days ago. Three episodes of vomiting today. EXAM: ABDOMEN - 2 VIEW COMPARISON:  10/04/2016. FINDINGS: Normal bowel gas pattern. No significant stool. Stable gastric band. Interval 4 mm left mid abdominal calcification. Stable thoracolumbar scoliosis and degenerative changes. Atheromatous arterial calcifications without visible aneurysm. Post CABG changes. Bilateral proximal thigh vascular stents. IMPRESSION: 1. No acute abnormality. 2. Interval 4 mm left mid abdominal calcification. This could represent a phlebolith or urinary tract calcification. Electronically Signed   By: Claudie Revering M.D.   On: 08/26/2018 21:45

## 2018-09-03 LAB — CBC
HCT: 30.2 % — ABNORMAL LOW (ref 36.0–46.0)
Hemoglobin: 9.5 g/dL — ABNORMAL LOW (ref 12.0–15.0)
MCH: 24.4 pg — ABNORMAL LOW (ref 26.0–34.0)
MCHC: 31.5 g/dL (ref 30.0–36.0)
MCV: 77.4 fL — ABNORMAL LOW (ref 80.0–100.0)
Platelets: 332 10*3/uL (ref 150–400)
RBC: 3.9 MIL/uL (ref 3.87–5.11)
RDW: 14.7 % (ref 11.5–15.5)
WBC: 9.2 10*3/uL (ref 4.0–10.5)
nRBC: 0 % (ref 0.0–0.2)

## 2018-09-03 LAB — FERRITIN: Ferritin: 214 ng/mL (ref 11–307)

## 2018-09-03 LAB — COMPREHENSIVE METABOLIC PANEL
ALT: 20 U/L (ref 0–44)
AST: 25 U/L (ref 15–41)
Albumin: 2.4 g/dL — ABNORMAL LOW (ref 3.5–5.0)
Alkaline Phosphatase: 47 U/L (ref 38–126)
Anion gap: 6 (ref 5–15)
BUN: 37 mg/dL — ABNORMAL HIGH (ref 8–23)
CO2: 26 mmol/L (ref 22–32)
Calcium: 9.7 mg/dL (ref 8.9–10.3)
Chloride: 99 mmol/L (ref 98–111)
Creatinine, Ser: 1.14 mg/dL — ABNORMAL HIGH (ref 0.44–1.00)
GFR calc Af Amer: 54 mL/min — ABNORMAL LOW (ref >60)
GFR calc non Af Amer: 46 mL/min — ABNORMAL LOW (ref >60)
Glucose, Bld: 314 mg/dL — ABNORMAL HIGH (ref 70–99)
Potassium: 4.4 mmol/L (ref 3.5–5.1)
Sodium: 131 mmol/L — ABNORMAL LOW (ref 135–145)
Total Bilirubin: 0.4 mg/dL (ref 0.3–1.2)
Total Protein: 6.2 g/dL — ABNORMAL LOW (ref 6.5–8.1)

## 2018-09-03 LAB — MAGNESIUM: Magnesium: 2 mg/dL (ref 1.7–2.4)

## 2018-09-03 LAB — GLUCOSE, CAPILLARY
Glucose-Capillary: 284 mg/dL — ABNORMAL HIGH (ref 70–99)
Glucose-Capillary: 311 mg/dL — ABNORMAL HIGH (ref 70–99)
Glucose-Capillary: 304 mg/dL — ABNORMAL HIGH (ref 70–99)
Glucose-Capillary: 310 mg/dL — ABNORMAL HIGH (ref 70–99)

## 2018-09-03 LAB — D-DIMER, QUANTITATIVE: D-Dimer, Quant: 1.3 ug/mL-FEU — ABNORMAL HIGH (ref 0.00–0.50)

## 2018-09-03 LAB — C-REACTIVE PROTEIN: CRP: 6.4 mg/dL — ABNORMAL HIGH (ref <1.0)

## 2018-09-03 MED ORDER — ONDANSETRON 4 MG PO TBDP
4.0000 mg | ORAL_TABLET | Freq: Two times a day (BID) | ORAL | Status: DC
  Administered 2018-09-03 – 2018-09-04 (×2): 4 mg via ORAL
  Filled 2018-09-03 (×2): qty 1

## 2018-09-03 MED ORDER — INSULIN GLARGINE 100 UNIT/ML ~~LOC~~ SOLN
10.0000 [IU] | Freq: Two times a day (BID) | SUBCUTANEOUS | Status: DC
  Administered 2018-09-03 (×2): 10 [IU] via SUBCUTANEOUS
  Filled 2018-09-03 (×3): qty 0.1

## 2018-09-03 NOTE — Progress Notes (Signed)
Assumed care of pt at 1100. Agree with previous RN assessment. Will continue plan of care.  Behr Cislo, Bing Neighbors, RN

## 2018-09-03 NOTE — Progress Notes (Addendum)
PROGRESS NOTE                                                                                                                                                                                                             Patient Demographics:    [name], is a 78 y.o. female, DOB - 10-30-1940, KKX:[id number]  Outpatient Primary MD for the patient is Clemmie Krill Lynnell Jude, MD   Admit date - 08/27/2018   LOS - 6  No chief complaint on file.      Brief Narrative: Patient is a 78 y.o. female with PMHx of chronic diastolic heart failure, CAD s/p CABG 2017, PAD s/p left fempo bypass in 2013, history of multiple myeloma in remission, HTN, DM-2, s/p lap band bariatric surgery 2007, chronic pain-presented with vomiting-she was recently diagnosed with COVID-19 on 8/6.  She was subsequently admitted to the hospitalist service.     Subjective:    [name] was sitting up at bedside chair-she had just moved to the chair from the bedside commode.  She is awake and alert-answering all my questions appropriately.  Denies any nausea this morning.   Assessment  & Plan :   Intractable nausea and vomiting: Continues to improve-seems to be tolerating advancement in diet.  Suspect has some amount of chronic intermittent nausea baseline-given her prior history of lap band surgery-she may have some subtle gastroparesis at baseline that could have worsened with acute viral illness.  Continue antiemetics and other supportive measures.   COVID-19 pneumonia: Remains very stable on room air for almost a week-given severe failure to thrive syndromes, persistently elevated CRP-she has been started on a short course of Decadron.  She is clinically improved this morning-and inflammatory markers have now started to downtrend.  No indication for Remdesivir given lack of hypoxemia.   Fever: afebrile  O2 requirements: None  COVID-19 Labs: Recent Labs     09/01/18 0445 09/02/18 0400 09/03/18 0600  DDIMER 2.53* 2.25* 1.30*  FERRITIN 151 221 214  CRP 17.7* 15.2* 6.4*   COVID-19 Medications: Steroids: Decadron 8/21>> Remdesivir:None Actemra:None Convalescent Plasma:N/A Research Studies:N/A  Other medications: Diuretics: Euvolemic-no indication for diuretics  Antibiotics:Not needed as no evidence of bacterial infection  DVT Prophylaxis  :  Heparin  Hypertension: Controlled-new Imdur and metoprolol.    DM-2: CBGs have started to creep up as patient  is on Decadron-resume Lantus 10 units twice daily and SSI.  Follow.  Stable with SSI.    CBG (last 3)  Recent Labs    09/02/18 1637 09/02/18 2044 09/03/18 0743  GLUCAP 395* 356* 284*    History of PAD/CAD s/p CABG: Currently asymptomatic-continue antiplatelets.  Asymptomatic bacteriuria: With no symptoms of UTI (denies dysuria or frequency)-UA essentially unremarkable-however urine culture drawn in the emergency room on 8/15 shows ESBL E. coli.  Since thought to have asymptomatic bacteriuria rather than UTI-no indications to start antibiotics  Deconditioning/debility/failure to thrive syndrome: Continues to exhibit failure to thrive syndrome-have encouraged patient to ambulate with PT, out of bed to chair-but seems to have perked up a little bit after starting her on steroids-we will plan a few day course.  PT following-with recommendations for SNF-social worker following with family.  ABG:    Component Value Date/Time   TCO2 22 03/05/2010 1028    Vent Settings: N/A  Condition - Stable  Family Communication  : Unable to reach patient's son on 8/23-getting voicemail (had left message yesterday)  Code Status :  Full Code  Diet :  Diet Order            Diet Carb Modified Fluid consistency: Thin; Room service appropriate? Yes  Diet effective now               Disposition Plan  : SNF-social worker following-awaiting bed/discussion with family  Consults  :   None  Procedures  :  None  Antibiotics  :    Anti-infectives (From admission, onward)   None      Inpatient Medications  Scheduled Meds:  aspirin  81 mg Oral Daily   clopidogrel  75 mg Oral Daily   dexamethasone  6 mg Oral Q breakfast   escitalopram  10 mg Oral Daily   fentaNYL  1 patch Transdermal Q72H   gabapentin  600 mg Oral BID   heparin  5,000 Units Subcutaneous Q8H   insulin aspart  0-9 Units Subcutaneous TID WC   insulin glargine  10 Units Subcutaneous BID   isosorbide mononitrate  30 mg Oral Daily   magnesium oxide  400 mg Oral Daily   metoprolol succinate  25 mg Oral Daily   ondansetron  4 mg Oral TID AC   pantoprazole  40 mg Oral BID   polyethylene glycol  17 g Oral Daily   sodium chloride flush  10-40 mL Intracatheter Q12H   Continuous Infusions:  PRN Meds:.acetaminophen, fluticasone, HYDROmorphone, nitroGLYCERIN, promethazine, sodium chloride flush   Time Spent in minutes  25  See all Orders from today for further details   Oren Binet M.D on 09/03/2018 at 11:03 AM  To page go to [email or web address] - use universal password  [facility] -  Office  [phone]    Objective:   Vitals:   09/02/18 1904 09/02/18 1915 09/03/18 0430 09/03/18 0745  BP: (!) 119/49  (!) 140/44 (!) 154/56  Pulse:   (!) 47 (!) 47  Resp:    18  Temp:  97.7 F (36.5 C) 97.7 F (36.5 C) 97.8 F (36.6 C)  TempSrc:  Oral Axillary Axillary  SpO2:   97% 100%  Weight:      Height:        Wt Readings from Last 3 Encounters:  08/28/18 70.3 kg  08/23/18 68 kg  01/11/18 72.6 kg    No intake or output data in the 24 hours ending 09/03/18 1103   Physical Exam Gen  Exam:Alert awake-not in any distress HEENT:atraumatic, normocephalic Chest: B/L clear to auscultation anteriorly CVS:S1S2 regular Abdomen:soft non tender, non distended Extremities:no edema Neurology: Non focal Skin: no rash   Data Review:    CBC Recent Labs  Lab 08/30/18 0842  08/31/18 0345 09/01/18 0445 09/02/18 0400 09/03/18 0600  WBC 7.3 8.3 8.4 6.9 9.2  HGB 9.8* 10.0* 9.7* 10.4* 9.5*  HCT 31.7* 32.0* 30.8* 33.3* 30.2*  PLT 250 292 319 332 332  MCV 79.1* 78.4* 78.0* 77.4* 77.4*  MCH 24.4* 24.5* 24.6* 24.2* 24.4*  MCHC 30.9 31.3 31.5 31.2 31.5  RDW 14.6 14.6 14.8 14.9 14.7    Chemistries  Recent Labs  Lab 08/30/18 0842 08/31/18 0345 09/01/18 0445 09/02/18 0400 09/03/18 0600  NA 132* 131* 132* 131* 131*  K 3.7 3.7 4.0 4.5 4.4  CL 101 98 100 97* 99  CO2 24 21* 23 22 26   GLUCOSE 79 91 75 203* 314*  BUN 17 18 17  28* 37*  CREATININE 1.04* 1.07* 0.99 0.98 1.14*  CALCIUM 9.1 9.1 9.4 10.2 9.7  MG  --   --   --   --  2.0  AST 21 23 33 42* 25  ALT 9 12 14 22 20   ALKPHOS 52 50 47 53 47  BILITOT 0.5 0.5 0.4 0.8 0.4   ------------------------------------------------------------------------------------------------------------------ No results for input(s): CHOL, HDL, LDLCALC, TRIG, CHOLHDL, LDLDIRECT in the last 72 hours.  Lab Results  Component Value Date   HGBA1C 7.9 (H) 08/27/2018   ------------------------------------------------------------------------------------------------------------------ No results for input(s): TSH, T4TOTAL, T3FREE, THYROIDAB in the last 72 hours.  Invalid input(s): FREET3 ------------------------------------------------------------------------------------------------------------------ Recent Labs    09/02/18 0400 09/03/18 0600  FERRITIN 221 214    Coagulation profile No results for input(s): INR, PROTIME in the last 168 hours.  Recent Labs    09/02/18 0400 09/03/18 0600  DDIMER 2.25* 1.30*    Cardiac Enzymes No results for input(s): CKMB, TROPONINI, MYOGLOBIN in the last 168 hours.  Invalid input(s): CK ------------------------------------------------------------------------------------------------------------------    Component Value Date/Time   BNP 438.8 (H) 01/13/2016 0086    Micro  Results Recent Results (from the past 240 hour(s))  Urine culture     Status: Abnormal   Collection Time: 08/26/18 11:02 PM   Specimen: Urine, Random  Result Value Ref Range Status   Specimen Description   Final    URINE, RANDOM Performed at [facility], [address], [place], [place] [address]    Special Requests   Final    NONE Performed at [facility], [address] [place], [place] [address]    Culture (A)  Final    >=100,000 COLONIES/mL ESCHERICHIA COLI Confirmed Extended Spectrum Beta-Lactamase Producer (ESBL).  In bloodstream infections from ESBL organisms, carbapenems are preferred over piperacillin/tazobactam. They are shown to have a lower risk of mortality.    Report Status 08/29/2018 FINAL  Final   Organism ID, Bacteria ESCHERICHIA COLI (A)  Final      Susceptibility   Escherichia coli - MIC*    AMPICILLIN >=32 RESISTANT Resistant     CEFAZOLIN >=64 RESISTANT Resistant     CEFTRIAXONE >=64 RESISTANT Resistant     CIPROFLOXACIN >=4 RESISTANT Resistant     GENTAMICIN <=1 SENSITIVE Sensitive     IMIPENEM <=0.25 SENSITIVE Sensitive     NITROFURANTOIN <=16 SENSITIVE Sensitive     TRIMETH/SULFA <=20 SENSITIVE Sensitive     AMPICILLIN/SULBACTAM 8 SENSITIVE Sensitive     PIP/TAZO <=4 SENSITIVE Sensitive     Extended  ESBL POSITIVE Resistant     * >=100,000 COLONIES/mL ESCHERICHIA COLI    Radiology Reports Ct Head Wo Contrast  Result Date: 08/26/2018 CLINICAL DATA:  Altered mental status. Nausea.  COVID-19 positive EXAM: CT HEAD WITHOUT CONTRAST TECHNIQUE: Contiguous axial images were obtained from the base of the skull through the vertex without intravenous contrast. COMPARISON:  Head CT 10/06/2016 FINDINGS: Brain: Patient had difficulty tolerating the exam, mild motion artifact. No intracranial hemorrhage, mass effect, or midline shift. No hydrocephalus. The basilar cisterns are patent. No evidence of territorial infarct or acute ischemia. No  extra-axial or intracranial fluid collection. Unchanged degree of generalized atrophy. Mild chronic small vessel ischemia. Vascular: Atherosclerosis of skullbase vasculature without hyperdense vessel or abnormal calcification. Skull: No fracture or focal lesion. Sinuses/Orbits: Mucosal thickening of ethmoid air cells. No fluid levels. Mastoid air cells are clear. Bilateral cataract resection. Other: None. IMPRESSION: 1. No acute intracranial abnormality. 2. Unchanged atrophy and chronic small vessel ischemia. Electronically Signed   By: Keith Rake M.D.   On: 08/26/2018 23:39   Dg Chest Portable 1 View  Result Date: 08/26/2018 CLINICAL DATA:  Nausea. Poor p.o. intake. Vomiting. Diagnosed with COVID-19 5 days ago. EXAM: PORTABLE CHEST 1 VIEW COMPARISON:  08/23/2018. FINDINGS: Normal sized heart. Mildly tortuous and calcified thoracic aorta. Stable mild diffuse peribronchial thickening and accentuation of the interstitial markings. Interval minimal patchy opacity in the lateral aspect of the right mid upper lung zone. Mild linear densities in both lungs with mild progression. Stable post CABG changes, lower thoracic spine degenerative changes and diffuse osteopenia. A stat after a IMPRESSION: 1. Interval minimal patchy opacity in right mid upper lung zone laterally. This may represent early lung changes due to the patient's COVID-19. 2. Mildly progressive linear atelectasis in both lungs. 3. Stable mild chronic bronchitic changes. Electronically Signed   By: Claudie Revering M.D.   On: 08/26/2018 21:49   Dg Chest Portable 1 View  Result Date: 08/23/2018 CLINICAL DATA:  Nausea.  COVID-19 positive. EXAM: PORTABLE CHEST 1 VIEW COMPARISON:  January 11, 2018 FINDINGS: The heart size and mediastinal contours are stable. Both lungs are clear. The visualized skeletal structures are stable. IMPRESSION: No acute cardiopulmonary disease identified. Electronically Signed   By: Abelardo Diesel M.D.   On: 08/23/2018 08:39    Dg Abd 2 Views  Result Date: 08/26/2018 CLINICAL DATA:  Nausea. Poor oral intake. Diagnosed with COVID-19 5 days ago. Three episodes of vomiting today. EXAM: ABDOMEN - 2 VIEW COMPARISON:  10/04/2016. FINDINGS: Normal bowel gas pattern. No significant stool. Stable gastric band. Interval 4 mm left mid abdominal calcification. Stable thoracolumbar scoliosis and degenerative changes. Atheromatous arterial calcifications without visible aneurysm. Post CABG changes. Bilateral proximal thigh vascular stents. IMPRESSION: 1. No acute abnormality. 2. Interval 4 mm left mid abdominal calcification. This could represent a phlebolith or urinary tract calcification. Electronically Signed   By: Claudie Revering M.D.   On: 08/26/2018 21:45

## 2018-09-04 LAB — D-DIMER, QUANTITATIVE: D-Dimer, Quant: 2.17 ug/mL-FEU — ABNORMAL HIGH (ref 0.00–0.50)

## 2018-09-04 LAB — FERRITIN: Ferritin: 173 ng/mL (ref 11–307)

## 2018-09-04 LAB — GLUCOSE, CAPILLARY
Glucose-Capillary: 268 mg/dL — ABNORMAL HIGH (ref 70–99)
Glucose-Capillary: 333 mg/dL — ABNORMAL HIGH (ref 70–99)
Glucose-Capillary: 351 mg/dL — ABNORMAL HIGH (ref 70–99)

## 2018-09-04 LAB — MAGNESIUM: Magnesium: 2 mg/dL (ref 1.7–2.4)

## 2018-09-04 LAB — C-REACTIVE PROTEIN: CRP: 2.9 mg/dL — ABNORMAL HIGH (ref <1.0)

## 2018-09-04 MED ORDER — METOPROLOL SUCCINATE ER 25 MG PO TB24
12.5000 mg | ORAL_TABLET | Freq: Every day | ORAL | Status: DC
  Filled 2018-09-04: qty 0.5

## 2018-09-04 MED ORDER — ONDANSETRON 4 MG PO TBDP: 4.0000 mg | ORAL_TABLET | Freq: Three times a day (TID) | ORAL | Status: DC | PRN

## 2018-09-04 MED ORDER — INSULIN GLARGINE 100 UNIT/ML ~~LOC~~ SOLN
14.0000 [IU] | Freq: Two times a day (BID) | SUBCUTANEOUS | Status: DC
  Administered 2018-09-04 – 2018-09-05 (×3): 14 [IU] via SUBCUTANEOUS
  Filled 2018-09-04 (×4): qty 0.14

## 2018-09-04 MED ORDER — INSULIN ASPART 100 UNIT/ML ~~LOC~~ SOLN
4.0000 [IU] | Freq: Three times a day (TID) | SUBCUTANEOUS | Status: DC
  Administered 2018-09-04 – 2018-09-07 (×11): 4 [IU] via SUBCUTANEOUS

## 2018-09-04 MED ORDER — DEXAMETHASONE 4 MG PO TABS
4.0000 mg | ORAL_TABLET | Freq: Every day | ORAL | Status: DC
  Administered 2018-09-04: 4 mg via ORAL
  Filled 2018-09-04: qty 1

## 2018-09-04 MED ORDER — DEXAMETHASONE 6 MG PO TABS
3.0000 mg | ORAL_TABLET | Freq: Every day | ORAL | Status: DC
  Administered 2018-09-05: 09:00:00 3 mg via ORAL
  Filled 2018-09-04: qty 1

## 2018-09-04 NOTE — Care Management (Signed)
Case manager contacted patient's son, Marcheta Barclift to continue discussing [facility] or SNF placement for his mom. Mr. Mey adamantly states that "she is not coming home until He feels she is readyand she certainly isnt going to a facility that gives lesser care". He requested last week that CM contact [facility] for a bed, which was done. CM spoke with Dorthula Matas, Admissions Coordinator and was informed that they do not have any beds and they are not accepting any COVID patients.  This information was given to Mr. Notch on 8/20. CM also explained multiple times that we are limited to facilities excepting COVID patients. He declined patient being referred to [facility] or [facility]. Repeat COVID test has been ordered today and Mr. Claiborne was informed by Dr. Sloan Leiter that patient is ready for discharge on tomorrow. Mr. Deshotel again informed CM that he will not take her home until he feels she is ready and COVID negative. He asked that CM contact Magda Paganini at [facility], stating Magda Paganini said they could take her if she has negative results. Case Manager called Magda Paganini at [phone], left a message requesting a return call.  Case manager will reach out to upper management for further direction.     Ricki Miller, RN BSN Case Manager [phone]

## 2018-09-04 NOTE — Progress Notes (Signed)
Updated and spoke with son Marlou Sa) on patient condition. Son did not have any further questions

## 2018-09-04 NOTE — Progress Notes (Addendum)
PROGRESS NOTE                                                                                                                                                                                                             Patient Demographics:    [name], is a 78 y.o. female, DOB - May 28, 1940, XHF:[id number]  Outpatient Primary MD for the patient is Clemmie Krill Lynnell Jude, MD   Admit date - 08/27/2018   LOS - 7  No chief complaint on file.      Brief Narrative: Patient is a 78 y.o. female with PMHx of chronic diastolic heart failure, CAD s/p CABG 2017, PAD s/p left fempo bypass in 2013, history of multiple myeloma in remission, HTN, DM-2, s/p lap band bariatric surgery 2007, chronic pain-presented with vomiting-she was recently diagnosed with COVID-19 on 8/6.  She was subsequently admitted to the hospitalist service.     Subjective:    [name] was sitting up at bedside chair-she had just moved to the chair from the bedside commode.  She is awake and alert-answering all my questions appropriately.  Denies any nausea this morning.   Assessment  & Plan :   Intractable nausea and vomiting: Improved-denies any nausea vomiting this morning.  Tolerating diet.  Suspect has some amount of chronic intermittent nausea baseline-given her prior history of lap band surgery-she may have some subtle gastroparesis at baseline that could have worsened with acute viral illness.  Continue antiemetics and other supportive measures.   COVID-19 pneumonia: Remains very stable on room air for almost a week-given severe failure to thrive syndromes, persistently elevated CRP-she was started on a short course of Decadron-we will start tapering Decadron over the next few days.  Inflammatory markers are now downtrending.  No indication for Remdesivir given lack of hypoxemia.    Fever: afebrile  O2 requirements: None  COVID-19 Labs: Recent Labs     09/02/18 0400 09/03/18 0600 09/04/18 0516  DDIMER 2.25* 1.30* 2.17*  FERRITIN 221 214 173  CRP 15.2* 6.4* 2.9*   COVID-19 Medications: Steroids: Decadron 8/21>> Remdesivir:None Actemra:None Convalescent Plasma:N/A Research Studies:N/A  Other medications: Diuretics: Euvolemic-no indication for diuretics  Antibiotics:Not needed as no evidence of bacterial infection  DVT Prophylaxis  :  Heparin  Hypertension: Controlled-new Imdur and metoprolol (dose of metoprolol decreased due to asymptomatic nocturnal bradycardia)  DM-2:  CBGs uncontrolled as patient is on Decadron-increase Lantus to 15 units twice daily, start 4 units of NovoLog with meals-continue SSI.  Suspect as Decadron starts being tapered down-CBGs will improve over the next few days.    CBG (last 3)  Recent Labs    09/03/18 2135 09/04/18 0804 09/04/18 1219  GLUCAP 310* 268* 333*    History of PAD/CAD s/p CABG: Currently asymptomatic-continue antiplatelets.  Asymptomatic bacteriuria: With no symptoms of UTI (denies dysuria or frequency)-UA essentially unremarkable-however urine culture drawn in the emergency room on 8/15 shows ESBL E. coli.  Since thought to have asymptomatic bacteriuria rather than UTI-no indications to start antibiotics  Deconditioning/debility/failure to thrive syndrome: Continues to exhibit failure to thrive syndrome-have encouraged patient to ambulate with PT, out of bed to chair-but seems to have perked up a little bit after starting her on steroids-we will plan a few day course.  PT following-with recommendations for SNF-social worker following with family.  ABG:    Component Value Date/Time   TCO2 22 03/05/2010 1028    Vent Settings: N/A  Condition - Stable  Family Communication  : Spoke with patient's son over the phone on 8/24 (he never returned my call over the weekned).  Explained that patient has improved and we are awaiting repeat COVID test.  Physical therapy has recommended  patient go to SNF-patient's son feels "very uncomfortable" moving her to a lower level of care as a "doctor only rounds there once a week".  Tried to explain that at this point patient needs rehabilitation with PT/OT-and that since we are acute hospital/COVID facility -our rehab team is unable to see the patient on a daily basis. Further more rehab can be done in a SNF as medical issues are stable. .I have tried to explain to him that we are a acute covid facility-and that we have patient's waiting in other campuses for beds here. Patient's son then proceeded to tell me-that he thinks the hospital should hire more physical therapists.  He also told me that he is not used to the "[facility]" and does not think that "[facility]" has problems like this.  I have explained to him that we are waiting for a repeat COVID test following which patient will be ready for discharge. I have implored him to start working with the social worker to see if he wants the patient to go to SNF, he should also explore the possibility of her coming home if he is able to provide 24/7 care-especially if he is not wanting her to go to a "particular" SNF.  Code Status :  Full Code  Diet :  Diet Order            Diet Carb Modified Fluid consistency: Thin; Room service appropriate? Yes  Diet effective now               Disposition Plan  : SNF-social worker following-awaiting bed/discussion with family  Consults  :  None  Procedures  :  None  Antibiotics  :    Anti-infectives (From admission, onward)   None      Inpatient Medications  Scheduled Meds:  aspirin  81 mg Oral Daily   clopidogrel  75 mg Oral Daily   dexamethasone  4 mg Oral Q breakfast   escitalopram  10 mg Oral Daily   fentaNYL  1 patch Transdermal Q72H   gabapentin  600 mg Oral BID   heparin  5,000 Units Subcutaneous Q8H   insulin aspart  0-9 Units Subcutaneous TID WC   insulin aspart  4 Units Subcutaneous TID WC   insulin glargine   14 Units Subcutaneous BID   isosorbide mononitrate  30 mg Oral Daily   magnesium oxide  400 mg Oral Daily   [START ON 09/05/2018] metoprolol succinate  12.5 mg Oral Daily   ondansetron  4 mg Oral Q12H   pantoprazole  40 mg Oral BID   polyethylene glycol  17 g Oral Daily   sodium chloride flush  10-40 mL Intracatheter Q12H   Continuous Infusions:  PRN Meds:.acetaminophen, fluticasone, HYDROmorphone, nitroGLYCERIN, promethazine, sodium chloride flush   Time Spent in minutes  25  See all Orders from today for further details   Oren Binet M.D on 09/04/2018 at 1:35 PM  To page go to [email or web address] - use universal password  [facility] -  Office  [phone]    Objective:   Vitals:   09/04/18 0800 09/04/18 0937 09/04/18 1210 09/04/18 1220  BP: (!) 140/53  (!) 113/45   Pulse: (!) 49  65 (!) 54  Resp:      Temp: 97.7 F (36.5 C)  97.7 F (36.5 C)   TempSrc: Oral  Oral   SpO2: 99% 99% 100% 100%  Weight:      Height:        Wt Readings from Last 3 Encounters:  08/28/18 70.3 kg  08/23/18 68 kg  01/11/18 72.6 kg     Intake/Output Summary (Last 24 hours) at 09/04/2018 1335 Last data filed at 09/04/2018 0700 Gross per 24 hour  Intake 360 ml  Output --  Net 360 ml     Physical Exam Gen Exam:Alert awake-not in any distress.  Chronically sick appearing-frail looking. HEENT:atraumatic, normocephalic Chest: B/L clear to auscultation anteriorly CVS:S1S2 regular Abdomen:soft non tender, non distended Extremities:no edema Neurology: Non focal Skin: no rash   Data Review:    CBC Recent Labs  Lab 08/30/18 0842 08/31/18 0345 09/01/18 0445 09/02/18 0400 09/03/18 0600  WBC 7.3 8.3 8.4 6.9 9.2  HGB 9.8* 10.0* 9.7* 10.4* 9.5*  HCT 31.7* 32.0* 30.8* 33.3* 30.2*  PLT 250 292 319 332 332  MCV 79.1* 78.4* 78.0* 77.4* 77.4*  MCH 24.4* 24.5* 24.6* 24.2* 24.4*  MCHC 30.9 31.3 31.5 31.2 31.5  RDW 14.6 14.6 14.8 14.9 14.7    Chemistries  Recent Labs   Lab 08/30/18 0842 08/31/18 0345 09/01/18 0445 09/02/18 0400 09/03/18 0600 09/04/18 0516  NA 132* 131* 132* 131* 131*  --   K 3.7 3.7 4.0 4.5 4.4  --   CL 101 98 100 97* 99  --   CO2 24 21* 23 22 26   --   GLUCOSE 79 91 75 203* 314*  --   BUN 17 18 17  28* 37*  --   CREATININE 1.04* 1.07* 0.99 0.98 1.14*  --   CALCIUM 9.1 9.1 9.4 10.2 9.7  --   MG  --   --   --   --  2.0 2.0  AST 21 23 33 42* 25  --   ALT 9 12 14 22 20   --   ALKPHOS 52 50 47 53 47  --   BILITOT 0.5 0.5 0.4 0.8 0.4  --    ------------------------------------------------------------------------------------------------------------------ No results for input(s): CHOL, HDL, LDLCALC, TRIG, CHOLHDL, LDLDIRECT in the last 72 hours.  Lab Results  Component Value Date   HGBA1C 7.9 (H) 08/27/2018   ------------------------------------------------------------------------------------------------------------------ No results for input(s): TSH, T4TOTAL, T3FREE, THYROIDAB in the last 72  hours.  Invalid input(s): FREET3 ------------------------------------------------------------------------------------------------------------------ Recent Labs    09/03/18 0600 09/04/18 0516  FERRITIN 214 173    Coagulation profile No results for input(s): INR, PROTIME in the last 168 hours.  Recent Labs    09/03/18 0600 09/04/18 0516  DDIMER 1.30* 2.17*    Cardiac Enzymes No results for input(s): CKMB, TROPONINI, MYOGLOBIN in the last 168 hours.  Invalid input(s): CK ------------------------------------------------------------------------------------------------------------------    Component Value Date/Time   BNP 438.8 (H) 01/13/2016 3149    Micro Results Recent Results (from the past 240 hour(s))  Urine culture     Status: Abnormal   Collection Time: 08/26/18 11:02 PM   Specimen: Urine, Random  Result Value Ref Range Status   Specimen Description   Final    URINE, RANDOM Performed at [facility], [address] [place], [place] [address]    Special Requests   Final    NONE Performed at [facility], [address] [place],  [address]    Culture (A)  Final    >=100,000 COLONIES/mL ESCHERICHIA COLI Confirmed Extended Spectrum Beta-Lactamase Producer (ESBL).  In bloodstream infections from ESBL organisms, carbapenems are preferred over piperacillin/tazobactam. They are shown to have a lower risk of mortality.    Report Status 08/29/2018 FINAL  Final   Organism ID, Bacteria ESCHERICHIA COLI (A)  Final      Susceptibility   Escherichia coli - MIC*    AMPICILLIN >=32 RESISTANT Resistant     CEFAZOLIN >=64 RESISTANT Resistant     CEFTRIAXONE >=64 RESISTANT Resistant     CIPROFLOXACIN >=4 RESISTANT Resistant     GENTAMICIN <=1 SENSITIVE Sensitive     IMIPENEM <=0.25 SENSITIVE Sensitive     NITROFURANTOIN <=16 SENSITIVE Sensitive     TRIMETH/SULFA <=20 SENSITIVE Sensitive     AMPICILLIN/SULBACTAM 8 SENSITIVE Sensitive     PIP/TAZO <=4 SENSITIVE Sensitive     Extended ESBL POSITIVE Resistant     * >=100,000 COLONIES/mL ESCHERICHIA COLI    Radiology Reports Ct Head Wo Contrast  Result Date: 08/26/2018 CLINICAL DATA:  Altered mental status. Nausea.  COVID-19 positive EXAM: CT HEAD WITHOUT CONTRAST TECHNIQUE: Contiguous axial images were obtained from the base of the skull through the vertex without intravenous contrast. COMPARISON:  Head CT 10/06/2016 FINDINGS: Brain: Patient had difficulty tolerating the exam, mild motion artifact. No intracranial hemorrhage, mass effect, or midline shift. No hydrocephalus. The basilar cisterns are patent. No evidence of territorial infarct or acute ischemia. No extra-axial or intracranial fluid collection. Unchanged degree of generalized atrophy. Mild chronic small vessel ischemia. Vascular: Atherosclerosis of skullbase vasculature without hyperdense vessel or abnormal calcification. Skull: No fracture or focal lesion. Sinuses/Orbits:  Mucosal thickening of ethmoid air cells. No fluid levels. Mastoid air cells are clear. Bilateral cataract resection. Other: None. IMPRESSION: 1. No acute intracranial abnormality. 2. Unchanged atrophy and chronic small vessel ischemia. Electronically Signed   By: Keith Rake M.D.   On: 08/26/2018 23:39   Dg Chest Portable 1 View  Result Date: 08/26/2018 CLINICAL DATA:  Nausea. Poor p.o. intake. Vomiting. Diagnosed with COVID-19 5 days ago. EXAM: PORTABLE CHEST 1 VIEW COMPARISON:  08/23/2018. FINDINGS: Normal sized heart. Mildly tortuous and calcified thoracic aorta. Stable mild diffuse peribronchial thickening and accentuation of the interstitial markings. Interval minimal patchy opacity in the lateral aspect of the right mid upper lung zone. Mild linear densities in both lungs with mild progression. Stable post CABG changes, lower thoracic spine degenerative changes and diffuse osteopenia. A stat  after a IMPRESSION: 1. Interval minimal patchy opacity in right mid upper lung zone laterally. This may represent early lung changes due to the patient's COVID-19. 2. Mildly progressive linear atelectasis in both lungs. 3. Stable mild chronic bronchitic changes. Electronically Signed   By: Claudie Revering M.D.   On: 08/26/2018 21:49   Dg Chest Portable 1 View  Result Date: 08/23/2018 CLINICAL DATA:  Nausea.  COVID-19 positive. EXAM: PORTABLE CHEST 1 VIEW COMPARISON:  January 11, 2018 FINDINGS: The heart size and mediastinal contours are stable. Both lungs are clear. The visualized skeletal structures are stable. IMPRESSION: No acute cardiopulmonary disease identified. Electronically Signed   By: Abelardo Diesel M.D.   On: 08/23/2018 08:39   Dg Abd 2 Views  Result Date: 08/26/2018 CLINICAL DATA:  Nausea. Poor oral intake. Diagnosed with COVID-19 5 days ago. Three episodes of vomiting today. EXAM: ABDOMEN - 2 VIEW COMPARISON:  10/04/2016. FINDINGS: Normal bowel gas pattern. No significant stool. Stable gastric  band. Interval 4 mm left mid abdominal calcification. Stable thoracolumbar scoliosis and degenerative changes. Atheromatous arterial calcifications without visible aneurysm. Post CABG changes. Bilateral proximal thigh vascular stents. IMPRESSION: 1. No acute abnormality. 2. Interval 4 mm left mid abdominal calcification. This could represent a phlebolith or urinary tract calcification. Electronically Signed   By: Claudie Revering M.D.   On: 08/26/2018 21:45

## 2018-09-04 NOTE — Progress Notes (Signed)
Occupational Therapy Treatment Patient Details Name: [name] MRN: [id number] DOB: 02-05-1940 Today's Date: 09/04/2018    History of present illness Pt is a 78 y.o. female with PMH significant for Anxiety, Arthritis, CAD, CHF, Chronic pain, HTN, MI (08/2015), Multiple myeloma (dx'd 2015), PAD, Stroke, and DM2; positive SARS-CoV-2 on 08/17/2018,experiencing severe nausea without vomiting, decreased activity tolerance, fatigue.   OT comments  Pt intermittently lethargic this session, but does appear more engaged compared to previous OT/PT treatment sessions. Pt requiring minA for functional transfers using RW and able to take few steps in room (approx 4-5'). Pt completing both standing and seated grooming ADL; requiring minA for standing balance and cues to initiate seated rest when appearing fatigued. Pt completing toileting ADL prior to transition back to bed. Pt on RA with SpO2 >90%. She does appear more motivated to progress with activity today, am hopeful she will continue to make progress with mobility/ADL status. Will continue per POC.    Follow Up Recommendations  SNF    Equipment Recommendations  None recommended by OT(pt has appropriate DME)          Precautions / Restrictions Precautions Precautions: Fall Precaution Comments: very weak Restrictions Weight Bearing Restrictions: No       Mobility Bed Mobility Overal bed mobility: Needs Assistance Bed Mobility: Supine to Sit Rolling: Mod assist   Supine to sit: Mod assist     General bed mobility comments: Mod assist mostly to support trunk to come to sitting EOB.  assist for LEs onto EOB when returning to supine. pt had been up in recliner earlier this AM with RN assist.   Transfers Overall transfer level: Needs assistance Equipment used: Rolling walker (2 wheeled) Transfers: Sit to/from [facility] Sit to Stand: Min assist Stand pivot transfers: Min assist       General transfer comment:  pt attempted to stand on her own but ulimately requires assist for power up to RW, VCs safe hand placement. steadying assist provided with functional transfers     Balance Overall balance assessment: Needs assistance Sitting-balance support: Feet supported;Bilateral upper extremity supported Sitting balance-Leahy Scale: Fair     Standing balance support: Bilateral upper extremity supported Standing balance-Leahy Scale: Poor Standing balance comment: needs external support and RW for standing.                             ADL either performed or assessed with clinical judgement   ADL Overall ADL's : Needs assistance/impaired     Grooming: Wash/dry face;Brushing hair;Set up;Min guard;Sitting;Oral care;Minimal assistance;Standing Grooming Details (indicate cue type and reason): pt motivated to perform grooming tasks in standing, performed x1 with minA for balance, while taking seated rest break on BSC pt performed additional with setup/minguard assist         Upper Body Dressing : Min guard;Set up;Sitting Upper Body Dressing Details (indicate cue type and reason): donning second gown     Toilet Transfer: Minimal assistance;Stand-pivot;BSC;RW   Toileting- Clothing Manipulation and Hygiene: Sit to/from stand;Sitting/lateral lean;Min guard;Minimal assistance Toileting - Clothing Manipulation Details (indicate cue type and reason): light assist for gown management, pt performing peri-care via lateral leans      Functional mobility during ADLs: Minimal assistance;Rolling walker General ADL Comments: pt tolerated taking some steps from EOB to sink in bedroom (approx 5') using RW, maintained standing to perform x1 grooming ADL prior to taking seated rest break. pt required cues to initiate seated rest  and not to overdo it considering pt has only engaged in minimal activity recently      Vision       Perception     Praxis      Cognition Arousal/Alertness:  Awake/alert(intermittently lethargic/closing eyes) Behavior During Therapy: Flat affect Overall Cognitive Status: No family/caregiver present to determine baseline cognitive functioning Area of Impairment: Following commands;Safety/judgement;Attention;Awareness                   Current Attention Level: Sustained   Following Commands: Follows one step commands with increased time Safety/Judgement: Decreased awareness of safety;Decreased awareness of deficits Awareness: Emergent   General Comments: pt appears more engaged today compared to previous treatment sessions though does require intermittent cues to maintain eyes open/remain awake; she continues to require increased time and cues for processing/following commands, cues for safety        Exercises     Shoulder Instructions       General Comments pt on RA with O2 sats >90% (when able to obtain accurate waveform); BP 113/45 while seated on BSC for toileting, requires cues to maintain eyes open/awake    Pertinent Vitals/ Pain       Pain Assessment: No/denies pain  Home Living                                          Prior Functioning/Environment              Frequency  Min 2X/week        Progress Toward Goals  OT Goals(current goals can now be found in the care plan section)  Progress towards OT goals: Progressing toward goals  Acute Rehab OT Goals Patient Stated Goal: did not state OT Goal Formulation: With patient Time For Goal Achievement: 09/13/18 Potential to Achieve Goals: Good ADL Goals Pt Will Perform Grooming: with supervision;standing Pt Will Perform Upper Body Dressing: with modified independence;sitting Pt Will Perform Lower Body Dressing: with supervision;sit to/from stand Pt Will Transfer to Toilet: with supervision;ambulating Pt Will Perform Toileting - Clothing Manipulation and hygiene: with modified independence;sit to/from stand Pt/caregiver will Perform Home  Exercise Program: Increased strength;Both right and left upper extremity;Independently;With theraband;With written HEP provided Additional ADL Goal #1: Pt will recall at least 3 ways of conserving energy during ADL routine at independent level  Plan Discharge plan remains appropriate    Co-evaluation                 AM-PAC OT "6 Clicks" Daily Activity     Outcome Measure   Help from another person eating meals?: None Help from another person taking care of personal grooming?: A Little Help from another person toileting, which includes using toliet, bedpan, or urinal?: A Little Help from another person bathing (including washing, rinsing, drying)?: A Lot Help from another person to put on and taking off regular upper body clothing?: A Little Help from another person to put on and taking off regular lower body clothing?: A Lot 6 Click Score: 17    End of Session Equipment Utilized During Treatment: Rolling walker  OT Visit Diagnosis: Unsteadiness on feet (R26.81);Muscle weakness (generalized) (M62.81);Other symptoms and signs involving cognitive function   Activity Tolerance Patient tolerated treatment well   Patient Left in bed;with call bell/phone within reach;with bed alarm set;with nursing/sitter in room   Nurse Communication Mobility status  Time: 8250-0370 OT Time Calculation (min): 41 min  Charges: OT General Charges $OT Visit: 1 Visit OT Treatments $Self Care/Home Management : 38-52 mins  Lou Cal, OT [facility] Pager [phone] Office [phone]    Raymondo Band 09/04/2018, 2:12 PM

## 2018-09-05 LAB — MAGNESIUM: Magnesium: 2 mg/dL (ref 1.7–2.4)

## 2018-09-05 LAB — C-REACTIVE PROTEIN: CRP: 1.4 mg/dL — ABNORMAL HIGH (ref <1.0)

## 2018-09-05 LAB — GLUCOSE, CAPILLARY
Glucose-Capillary: 363 mg/dL — ABNORMAL HIGH (ref 70–99)
Glucose-Capillary: 155 mg/dL — ABNORMAL HIGH (ref 70–99)
Glucose-Capillary: 192 mg/dL — ABNORMAL HIGH (ref 70–99)
Glucose-Capillary: 265 mg/dL — ABNORMAL HIGH (ref 70–99)
Glucose-Capillary: 286 mg/dL — ABNORMAL HIGH (ref 70–99)

## 2018-09-05 LAB — NOVEL CORONAVIRUS, NAA (HOSP ORDER, SEND-OUT TO REF LAB; TAT 18-24 HRS): SARS-CoV-2, NAA: DETECTED — AB

## 2018-09-05 LAB — FERRITIN: Ferritin: 130 ng/mL (ref 11–307)

## 2018-09-05 LAB — D-DIMER, QUANTITATIVE: D-Dimer, Quant: 1.65 ug/mL-FEU — ABNORMAL HIGH (ref 0.00–0.50)

## 2018-09-05 MED ORDER — DEXAMETHASONE 0.5 MG PO TABS
1.0000 mg | ORAL_TABLET | Freq: Every day | ORAL | Status: AC
  Administered 2018-09-06: 10:00:00 1 mg via ORAL
  Filled 2018-09-05: qty 2

## 2018-09-05 MED ORDER — METOPROLOL SUCCINATE 12.5 MG HALF TABLET
12.5000 mg | ORAL_TABLET | Freq: Every day | ORAL | Status: DC
  Filled 2018-09-05: qty 1

## 2018-09-05 MED ORDER — METOPROLOL SUCCINATE ER 25 MG PO TB24
12.5000 mg | ORAL_TABLET | Freq: Every day | ORAL | Status: DC
  Administered 2018-09-05: 13:00:00 12.5 mg via ORAL
  Filled 2018-09-05 (×2): qty 0.5

## 2018-09-05 MED ORDER — INSULIN GLARGINE 100 UNIT/ML ~~LOC~~ SOLN
10.0000 [IU] | Freq: Two times a day (BID) | SUBCUTANEOUS | Status: DC
  Administered 2018-09-05 – 2018-09-06 (×2): 10 [IU] via SUBCUTANEOUS
  Filled 2018-09-05 (×2): qty 0.1

## 2018-09-05 NOTE — Progress Notes (Signed)
PROGRESS NOTE                                                                                                                                                                                                             Patient Demographics:    [name], is a 78 y.o. female, DOB - 1940/04/29, UTM:[id number]  Outpatient Primary MD for the patient is [name] Krill Lynnell Jude, MD   Admit date - 08/27/2018   LOS - 8  No chief complaint on file.      Brief Narrative: Patient is a 78 y.o. female with PMHx of chronic diastolic heart failure, CAD s/p CABG 2017, PAD s/p left fempo bypass in 2013, history of multiple myeloma in remission, HTN, DM-2, s/p lap band bariatric surgery 2007, chronic pain-presented with vomiting-she was recently diagnosed with COVID-19 on 8/6 (at [facility]-results in care everywhere).  She was subsequently admitted to the hospitalist service.     Subjective:    [name] was sitting at bedside chair-eating breakfast this morning.  Denies any nausea, vomiting or diarrhea.  Denies any abdominal pain.  Not on oxygen.  Per RN-no major events overnight.   Assessment  & Plan :   Intractable nausea and vomiting: Resolved-no nausea vomiting for the past several days.  Tolerating diet.  Suspect has some amount of chronic intermittent nausea baseline-given her prior history of lap band surgery-she may have some subtle gastroparesis at baseline that could have worsened with acute viral illness.  Continue antiemetics and other supportive measures.   COVID-19 pneumonia: Remains very stable-not on oxygen for more than a week.  Given weakness/failure to thrive syndrome-and persistently elevated inflammatory markers-started on Decadron with significant improvement.  Inflammatory markers have down trended significantly-continue to taper Decadron.   Fever: afebrile  O2 requirements: None  COVID-19 Labs: Recent Labs    09/03/18 0600  09/04/18 0516 09/05/18 0440  DDIMER 1.30* 2.17* 1.65*  FERRITIN 214 173 130  CRP 6.4* 2.9* 1.4*   COVID-19 Medications: Steroids: Decadron 8/21>> Remdesivir:None Actemra:None Convalescent Plasma:N/A Research Studies:N/A  Other medications: Diuretics: Euvolemic-no indication for diuretics  Antibiotics:Not needed as no evidence of bacterial infection  DVT Prophylaxis  :  Heparin  Hypertension: Controlled-continue Imdur-hold new Imdur and metoprolol (dose decreased to 12.5 mg for asymptomatic nocturnal bradycardia-reassess tomorrow)   DM-2: CBGs better controlled as Decadron has been  tapered down-suspect will require less insulin usage-hence will decrease Lantus to 10 units twice daily, continue with 4 units of NovoLog with meals and SSI.  Follow and adjust    CBG (last 3)  Recent Labs    09/04/18 2136 09/05/18 0812 09/05/18 1252  GLUCAP 351* 155* 192*    History of PAD/CAD s/p CABG: Currently asymptomatic-continue antiplatelets.  Asymptomatic bacteriuria: With no symptoms of UTI (denies dysuria or frequency)-UA essentially unremarkable-however urine culture drawn in the emergency room on 8/15 shows ESBL E. coli.  Since thought to have asymptomatic bacteriuria rather than UTI-no indications to start antibiotics  Deconditioning/debility/failure to thrive syndrome: Continues to exhibit failure to thrive syndrome-have encouraged patient to ambulate with PT, out of bed to chair-but seems to have perked up a little bit after starting her on steroids-we will plan a few day course.  PT following-with recommendations for SNF-social worker following with family.  ABG:    Component Value Date/Time   TCO2 22 03/05/2010 1028    Vent Settings: N/A  Condition - Stable  Family Communication  : We will update son later today-please see my detailed note on 8/24  Code Status :  Full Code  Diet :  Diet Order            Diet Carb Modified Fluid consistency: Thin; Room service  appropriate? Yes  Diet effective now               Disposition Plan  : SNF-social worker following-awaiting bed/discussion with family  Consults  :  None  Procedures  :  None  Antibiotics  :    Anti-infectives (From admission, onward)   None      Inpatient Medications  Scheduled Meds:  aspirin  81 mg Oral Daily   clopidogrel  75 mg Oral Daily   dexamethasone  3 mg Oral Q breakfast   escitalopram  10 mg Oral Daily   fentaNYL  1 patch Transdermal Q72H   gabapentin  600 mg Oral BID   heparin  5,000 Units Subcutaneous Q8H   insulin aspart  0-9 Units Subcutaneous TID WC   insulin aspart  4 Units Subcutaneous TID WC   insulin glargine  14 Units Subcutaneous BID   isosorbide mononitrate  30 mg Oral Daily   magnesium oxide  400 mg Oral Daily   metoprolol succinate  12.5 mg Oral Daily   pantoprazole  40 mg Oral BID   polyethylene glycol  17 g Oral Daily   sodium chloride flush  10-40 mL Intracatheter Q12H   Continuous Infusions:  PRN Meds:.acetaminophen, fluticasone, HYDROmorphone, nitroGLYCERIN, ondansetron, promethazine, sodium chloride flush   Time Spent in minutes  25  See all Orders from today for further details   Oren Binet M.D on 09/05/2018 at 1:19 PM  To page go to [email or web address] - use universal password  [facility] -  Office  [phone]    Objective:   Vitals:   09/04/18 1919 09/05/18 0358 09/05/18 0810 09/05/18 1253  BP: (!) 130/56 (!) 137/53 (!) 140/52 (!) 115/45  Pulse: (!) 53 (!) 44 (!) 48 63  Resp: 17 16    Temp: 97.7 F (36.5 C) (!) 97.2 F (36.2 C) (!) 96.9 F (36.1 C) (!) 97.4 F (36.3 C)  TempSrc: Oral Oral Axillary Oral  SpO2: 96% 96% 98% 97%  Weight:      Height:        Wt Readings from Last 3 Encounters:  08/28/18 70.3 kg  08/23/18 68 kg  01/11/18 72.6 kg     Intake/Output Summary (Last 24 hours) at 09/05/2018 1319 Last data filed at 09/05/2018 1000 Gross per 24 hour  Intake 960 ml  Output --   Net 960 ml     Physical Exam Gen Exam:Alert awake-not in any distress.  Looks frail and chronically sick appearing HEENT:atraumatic, normocephalic Chest: B/L clear to auscultation anteriorly CVS:S1S2 regular Abdomen:soft non tender, non distended Extremities:no edema Neurology: Non focal Skin: no rash   Data Review:    CBC Recent Labs  Lab 08/30/18 0842 08/31/18 0345 09/01/18 0445 09/02/18 0400 09/03/18 0600  WBC 7.3 8.3 8.4 6.9 9.2  HGB 9.8* 10.0* 9.7* 10.4* 9.5*  HCT 31.7* 32.0* 30.8* 33.3* 30.2*  PLT 250 292 319 332 332  MCV 79.1* 78.4* 78.0* 77.4* 77.4*  MCH 24.4* 24.5* 24.6* 24.2* 24.4*  MCHC 30.9 31.3 31.5 31.2 31.5  RDW 14.6 14.6 14.8 14.9 14.7    Chemistries  Recent Labs  Lab 08/30/18 0842 08/31/18 0345 09/01/18 0445 09/02/18 0400 09/03/18 0600 09/04/18 0516 09/05/18 0440  NA 132* 131* 132* 131* 131*  --   --   K 3.7 3.7 4.0 4.5 4.4  --   --   CL 101 98 100 97* 99  --   --   CO2 24 21* 23 22 26   --   --   GLUCOSE 79 91 75 203* 314*  --   --   BUN 17 18 17  28* 37*  --   --   CREATININE 1.04* 1.07* 0.99 0.98 1.14*  --   --   CALCIUM 9.1 9.1 9.4 10.2 9.7  --   --   MG  --   --   --   --  2.0 2.0 2.0  AST 21 23 33 42* 25  --   --   ALT 9 12 14 22 20   --   --   ALKPHOS 52 50 47 53 47  --   --   BILITOT 0.5 0.5 0.4 0.8 0.4  --   --    ------------------------------------------------------------------------------------------------------------------ No results for input(s): CHOL, HDL, LDLCALC, TRIG, CHOLHDL, LDLDIRECT in the last 72 hours.  Lab Results  Component Value Date   HGBA1C 7.9 (H) 08/27/2018   ------------------------------------------------------------------------------------------------------------------ No results for input(s): TSH, T4TOTAL, T3FREE, THYROIDAB in the last 72 hours.  Invalid input(s): FREET3 ------------------------------------------------------------------------------------------------------------------ Recent Labs     09/04/18 0516 09/05/18 0440  FERRITIN 173 130    Coagulation profile No results for input(s): INR, PROTIME in the last 168 hours.  Recent Labs    09/04/18 0516 09/05/18 0440  DDIMER 2.17* 1.65*    Cardiac Enzymes No results for input(s): CKMB, TROPONINI, MYOGLOBIN in the last 168 hours.  Invalid input(s): CK ------------------------------------------------------------------------------------------------------------------    Component Value Date/Time   BNP 438.8 (H) 01/13/2016 2774    Micro Results Recent Results (from the past 240 hour(s))  Urine culture     Status: Abnormal   Collection Time: 08/26/18 11:02 PM   Specimen: Urine, Random  Result Value Ref Range Status   Specimen Description   Final    URINE, RANDOM Performed at [facility], [address] [place], [place] [address]    Special Requests   Final    NONE Performed at [facility], [address] [place], [place] [address]    Culture (A)  Final    >=100,000 COLONIES/mL ESCHERICHIA COLI Confirmed Extended Spectrum Beta-Lactamase Producer (ESBL).  In bloodstream infections from ESBL organisms, carbapenems are  preferred over piperacillin/tazobactam. They are shown to have a lower risk of mortality.    Report Status 08/29/2018 FINAL  Final   Organism ID, Bacteria ESCHERICHIA COLI (A)  Final      Susceptibility   Escherichia coli - MIC*    AMPICILLIN >=32 RESISTANT Resistant     CEFAZOLIN >=64 RESISTANT Resistant     CEFTRIAXONE >=64 RESISTANT Resistant     CIPROFLOXACIN >=4 RESISTANT Resistant     GENTAMICIN <=1 SENSITIVE Sensitive     IMIPENEM <=0.25 SENSITIVE Sensitive     NITROFURANTOIN <=16 SENSITIVE Sensitive     TRIMETH/SULFA <=20 SENSITIVE Sensitive     AMPICILLIN/SULBACTAM 8 SENSITIVE Sensitive     PIP/TAZO <=4 SENSITIVE Sensitive     Extended ESBL POSITIVE Resistant     * >=100,000 COLONIES/mL ESCHERICHIA COLI  Novel Coronavirus, NAA (hospital order; send-out to  ref lab)     Status: Abnormal   Collection Time: 09/03/18  4:22 PM   Specimen: Nasopharyngeal Swab; Respiratory  Result Value Ref Range Status   SARS-CoV-2, NAA DETECTED (A) NOT DETECTED Final    Comment: (NOTE)                  Client Requested Flag This test was developed and its performance characteristics determined by [employer]. This test has not been FDA cleared or approved. This test has been authorized by FDA under an Emergency Use Authorization (EUA). This test is only authorized for the duration of time the declaration that circumstances exist justifying the authorization of the emergency use of in vitro diagnostic tests for detection of SARS-CoV-2 virus and/or diagnosis of COVID-19 infection under section 564(b)(1) of the Act, 21 U.S.C. 256LSL-3(T)(3), unless the authorization is terminated or revoked sooner. When diagnostic testing is negative, the possibility of a false negative result should be considered in the context of a patient's recent exposures and the presence of clinical signs and symptoms consistent with COVID-19. An individual without symptoms of COVID-19 and who is not shedding SARS-CoV-2 virus would expect to have a negative (not det ected) result in this assay. Performed At: [facility] [place], [place] [id number] Rush Farmer MD BW:[id number]    [place]  Final    Comment: Performed at [facility] [address] [place], [place] [address]    Radiology Reports Ct Head Wo Contrast  Result Date: 08/26/2018 CLINICAL DATA:  Altered mental status. Nausea.  COVID-19 positive EXAM: CT HEAD WITHOUT CONTRAST TECHNIQUE: Contiguous axial images were obtained from the base of the skull through the vertex without intravenous contrast. COMPARISON:  Head CT 10/06/2016 FINDINGS: Brain: Patient had difficulty tolerating the exam, mild motion artifact. No intracranial hemorrhage, mass  effect, or midline shift. No hydrocephalus. The basilar cisterns are patent. No evidence of territorial infarct or acute ischemia. No extra-axial or intracranial fluid collection. Unchanged degree of generalized atrophy. Mild chronic small vessel ischemia. Vascular: Atherosclerosis of skullbase vasculature without hyperdense vessel or abnormal calcification. Skull: No fracture or focal lesion. Sinuses/Orbits: Mucosal thickening of ethmoid air cells. No fluid levels. Mastoid air cells are clear. Bilateral cataract resection. Other: None. IMPRESSION: 1. No acute intracranial abnormality. 2. Unchanged atrophy and chronic small vessel ischemia. Electronically Signed   By: Keith Rake M.D.   On: 08/26/2018 23:39   Dg Chest Portable 1 View  Result Date: 08/26/2018 CLINICAL DATA:  Nausea. Poor p.o. intake. Vomiting. Diagnosed with COVID-19 5 days ago. EXAM: PORTABLE CHEST 1 VIEW COMPARISON:  08/23/2018. FINDINGS:  Normal sized heart. Mildly tortuous and calcified thoracic aorta. Stable mild diffuse peribronchial thickening and accentuation of the interstitial markings. Interval minimal patchy opacity in the lateral aspect of the right mid upper lung zone. Mild linear densities in both lungs with mild progression. Stable post CABG changes, lower thoracic spine degenerative changes and diffuse osteopenia. A stat after a IMPRESSION: 1. Interval minimal patchy opacity in right mid upper lung zone laterally. This may represent early lung changes due to the patient's COVID-19. 2. Mildly progressive linear atelectasis in both lungs. 3. Stable mild chronic bronchitic changes. Electronically Signed   By: Claudie Revering M.D.   On: 08/26/2018 21:49   Dg Chest Portable 1 View  Result Date: 08/23/2018 CLINICAL DATA:  Nausea.  COVID-19 positive. EXAM: PORTABLE CHEST 1 VIEW COMPARISON:  January 11, 2018 FINDINGS: The heart size and mediastinal contours are stable. Both lungs are clear. The visualized skeletal structures are  stable. IMPRESSION: No acute cardiopulmonary disease identified. Electronically Signed   By: Abelardo Diesel M.D.   On: 08/23/2018 08:39   Dg Abd 2 Views  Result Date: 08/26/2018 CLINICAL DATA:  Nausea. Poor oral intake. Diagnosed with COVID-19 5 days ago. Three episodes of vomiting today. EXAM: ABDOMEN - 2 VIEW COMPARISON:  10/04/2016. FINDINGS: Normal bowel gas pattern. No significant stool. Stable gastric band. Interval 4 mm left mid abdominal calcification. Stable thoracolumbar scoliosis and degenerative changes. Atheromatous arterial calcifications without visible aneurysm. Post CABG changes. Bilateral proximal thigh vascular stents. IMPRESSION: 1. No acute abnormality. 2. Interval 4 mm left mid abdominal calcification. This could represent a phlebolith or urinary tract calcification. Electronically Signed   By: Claudie Revering M.D.   On: 08/26/2018 21:45

## 2018-09-05 NOTE — Care Management (Signed)
Case manager contacted patient's son, [name], [phone] and updated him that patient's repeat COVID test remains positive. Again explained that we need to fax patient out for SNF and he adamantly refuses, asked if she can go home with home health and he refuses, saying "she is staying right there". Case manager has updated MD.     Ricki Miller, RN BSN Case Manager   [phone]

## 2018-09-05 NOTE — Progress Notes (Signed)
Called patient son [name], update given and questions answered.

## 2018-09-05 NOTE — Progress Notes (Signed)
CRITICAL VALUE ALERT  Critical Value:  covid positive  Date & Time Notied:  [id number] 09/05/2018  Provider Notified: Nena Alexander MD  Orders Received/Actions taken: no new orders at this time

## 2018-09-05 NOTE — Progress Notes (Signed)
Physical Therapy Treatment Patient Details Name: [name] MRN: [id number] DOB: Jan 02, 1941 Today's Date: 09/05/2018    History of Present Illness Pt is a 78 y.o. female with PMH significant for Anxiety, Arthritis, CAD, CHF, Chronic pain, HTN, MI (08/2015), Multiple myeloma (dx'd 2015), PAD, Stroke, and DM2; positive SARS-CoV-2 on 08/17/2018,experiencing severe nausea without vomiting, decreased activity tolerance, fatigue.    PT Comments    Patient remains with flat affect, however participates fully with all that was asked of her. She tolerated activity better today. She was on room air with sats 94-99% throughout session. She was able to ambulate with RW and minguard assist x 12 feet, seated rest and then another 8 feet. When attempting to discuss her discharge plan, she replied, "my son makes all my medical decisions." When asked if she thought she was strong enough to go home with HHPT she stated she did not feel she was strong enough yet to go home.     Follow Up Recommendations  SNF     Equipment Recommendations  None recommended by PT    Recommendations for Other Services       Precautions / Restrictions Precautions Precautions: Fall Precaution Comments: very weak Restrictions Weight Bearing Restrictions: No    Mobility  Bed Mobility                  Transfers Overall transfer level: Needs assistance Equipment used: Rolling walker (2 wheeled) Transfers: Sit to/from Stand;Stand Pivot Transfers Sit to Stand: Min guard         General transfer comment: incr effort and nearly needed assist from recliner and from EOB; good sequencing with RW  Ambulation/Gait Ambulation/Gait assistance: Min guard Gait Distance (Feet): 12 Feet(seated rest, 8 ft) Assistive device: Rolling walker (2 wheeled) Gait Pattern/deviations: Step-through pattern;Decreased stride length;Trunk flexed     General Gait Details: pt able to manage RW without slides on back legs  (missing) including during turns; vc for proximity to RW and upright posture; had to encourage pt to sit and rest after 12 feet as her velocity and step length significantly decreased and she stated her legs were very tired   Stairs             Wheelchair Mobility    Modified Rankin (Stroke Patients Only)       Balance Overall balance assessment: Needs assistance Sitting-balance support: Feet supported;No upper extremity supported Sitting balance-Leahy Scale: Fair Sitting balance - Comments: at edge of Chair   Standing balance support: Bilateral upper extremity supported Standing balance-Leahy Scale: Poor Standing balance comment: needs bil UE support                            Cognition Arousal/Alertness: Awake/alert(intermittently lethargic/closing eyes) Behavior During Therapy: Flat affect   Area of Impairment: Safety/judgement                       Following Commands: Follows one step commands consistently Safety/Judgement: Decreased awareness of safety;Decreased awareness of deficits Awareness: Emergent   General Comments: encouraged to gauge how far she could walk and have enough strength to walk back; after 12 feet she turned around to walk back, went 2 feet and needed to sit on EOB.      Exercises General Exercises - Lower Extremity Long Arc Quad: Strengthening;Both;5 reps;Seated Hip Flexion/Marching: Strengthening;Both;5 reps;Seated Toe Raises: Strengthening;Both;10 reps;Seated Heel Raises: Strengthening;Both;10 reps;Seated    General Comments General comments (skin  integrity, edema, etc.): Discussed discharge plan and pt agrees she is currently too weak to go home and needs further rehab      Pertinent Vitals/Pain Pain Assessment: No/denies pain    Home Living Family/patient expects to be discharged to:: Private residence Living Arrangements: Alone Available Help at Discharge: Family;Available 24 hours/day Type of Home:  House Home Access: Ramped entrance   Home Layout: One level Home Equipment: Walker - 2 wheels;Bedside commode;Electric scooter;Shower seat;Grab bars - tub/shower      Prior Function Level of Independence: Independent with assistive device(s)      Comments: uses RW for mobility in home and scooter for community   PT Goals (current goals can now be found in the care plan section) Acute Rehab PT Goals Patient Stated Goal: did not state Time For Goal Achievement: 09/14/18 Potential to Achieve Goals: Good Progress towards PT goals: Progressing toward goals    Frequency    Min 3X/week(family wants more; may refuse SNf)      PT Plan Current plan remains appropriate    Co-evaluation              AM-PAC PT "6 Clicks" Mobility   Outcome Measure  Help needed turning from your back to your side while in a flat bed without using bedrails?: A Little Help needed moving from lying on your back to sitting on the side of a flat bed without using bedrails?: A Lot Help needed moving to and from a bed to a chair (including a wheelchair)?: A Little Help needed standing up from a chair using your arms (e.g., wheelchair or bedside chair)?: A Little Help needed to walk in hospital room?: A Little Help needed climbing 3-5 steps with a railing? : Total 6 Click Score: 15    End of Session Equipment Utilized During Treatment: Gait belt Activity Tolerance: Patient limited by fatigue Patient left: in chair;with call bell/phone within reach   PT Visit Diagnosis: Muscle weakness (generalized) (M62.81);Difficulty in walking, not elsewhere classified (R26.2);Pain     Time: 5465-6812 PT Time Calculation (min) (ACUTE ONLY): 28 min  Charges:  $Gait Training: 8-22 mins $Therapeutic Exercise: 8-22 mins                       Barry Brunner, PT       Rexanne Mano 09/05/2018, 2:44 PM

## 2018-09-06 LAB — GLUCOSE, CAPILLARY
Glucose-Capillary: 129 mg/dL — ABNORMAL HIGH (ref 70–99)
Glucose-Capillary: 152 mg/dL — ABNORMAL HIGH (ref 70–99)
Glucose-Capillary: 95 mg/dL (ref 70–99)
Glucose-Capillary: 179 mg/dL — ABNORMAL HIGH (ref 70–99)

## 2018-09-06 LAB — TSH: TSH: 1.137 u[IU]/mL (ref 0.350–4.500)

## 2018-09-06 MED ORDER — INSULIN GLARGINE 100 UNIT/ML ~~LOC~~ SOLN
8.0000 [IU] | Freq: Two times a day (BID) | SUBCUTANEOUS | Status: DC
  Administered 2018-09-06 – 2018-09-07 (×2): 8 [IU] via SUBCUTANEOUS
  Filled 2018-09-06 (×3): qty 0.08

## 2018-09-06 MED ORDER — METOPROLOL SUCCINATE 12.5 MG HALF TABLET
12.5000 mg | ORAL_TABLET | Freq: Every day | ORAL | Status: DC
  Administered 2018-09-08: 12.5 mg via ORAL
  Filled 2018-09-06 (×2): qty 1

## 2018-09-06 NOTE — Progress Notes (Addendum)
PROGRESS NOTE                                                                                                                                                                                                             Patient Demographics:    [name], is a 78 y.o. female, DOB - 05/29/1940, CBU:[id number]  Outpatient Primary MD for the patient is [name] Krill Lynnell Jude, MD   Admit date - 08/27/2018   LOS - 9  No chief complaint on file.      Brief Narrative: Patient is a 78 y.o. female with PMHx of chronic diastolic heart failure, CAD s/p CABG 2017, PAD s/p left fempo bypass in 2013, history of multiple myeloma in remission, HTN, DM-2, s/p lap band bariatric surgery 2007, chronic pain-presented with vomiting-she was recently diagnosed with COVID-19 on 8/6 (at [facility]-results in care everywhere).  She was subsequently admitted to the hospitalist service.     Subjective:    [name] was eating breakfast when I saw her this morning.  She has not had any nausea vomiting for the past several days.  She remains on room air with O2 saturation in the high 90s.  She denies any cough or shortness of breath.   Assessment  & Plan :   Intractable nausea and vomiting: Resolved-no nausea vomiting for the past several days.  Tolerating diet.  Suspect has some amount of chronic intermittent nausea baseline-given her prior history of lap band surgery-she may have some subtle gastroparesis at baseline that could have worsened with acute viral illness.  Continue antiemetics and other supportive measures.   COVID-19 pneumonia: Remains essentially asymptomatic for the past few days-no significant cough-on room air.  Given weakness/failure to thrive syndrome-and persistently elevated inflammatory markers-started on Decadron with significant improvement.  Inflammatory markers have decreased significantly-Decadron has been tapered down-last dose on 8/26.   Repeat COVID PCR on 8/23 remains positive.  Fever: afebrile  O2 requirements: None  COVID-19 Labs: Recent Labs    09/04/18 0516 09/05/18 0440  DDIMER 2.17* 1.65*  FERRITIN 173 130  CRP 2.9* 1.4*   COVID-19 Medications: Steroids: Decadron 8/21>> Remdesivir:None Actemra:None Convalescent Plasma:N/A  Research Studies:N/A  Other medications: Diuretics: Euvolemic-no indication for diuretics  Antibiotics:Not needed as no evidence of bacterial infection  DVT Prophylaxis  :  Heparin  Hypertension: Controlled-continue Imdur- and metoprolol (dose decreased to 12.5 mg for asymptomatic nocturnal bradycardia-reassess tomorrow)   DM-2: CBGs stable this morning-suspect that insulin requirements will continue to come down as patient is now off steroids.  Decrease Lantus to 8 units twice daily, and continue with SSI.  CBG (last 3)  Recent Labs    09/05/18 1613 09/05/18 2114 09/06/18 0817  GLUCAP 265* 286* 129*    History of PAD/CAD s/p CABG: Currently asymptomatic-continue antiplatelets.  Asymptomatic bacteriuria: With no symptoms of UTI (denies dysuria or frequency)-UA essentially unremarkable-however urine culture drawn in the emergency room on 8/15 shows ESBL E. coli.  Since thought to have asymptomatic bacteriuria rather than UTI-no indications to start antibiotics  Deconditioning/debility/failure to thrive syndrome: Continues to exhibit failure to thrive syndrome-have encouraged patient to ambulate with PT, out of bed to chair-but seems to have perked up a little bit after starting her on steroids-we will plan a few day course.  PT following-with recommendations for SNF-social worker following with family.  ABG:    Component Value Date/Time   TCO2 22 03/05/2010 1028    Vent Settings: N/A  Condition - Stable  Family Communication  : Spoke with patient's son on 8/26-explained that patient is more than stable for discharge.  He still does not "feel comfortable" in either  taking her home or getting her to go to SNF's that are taking go with positive patients(he is unhappy with the 2 facilities that have offered beds).  Explained to patient's son that this is an acute care facility-and that we have patients waiting for beds in this facility.  He is aware that once patient's are ready for discharge-they go to other facilities for rehab.  I have tried to reassure him-but at the same time have also explained to him that we are in the midst of a pandemic-and we have only a limited number of SNF that will take COVID positive patients.  Code Status :  Full Code  Diet :  Diet Order            Diet Carb Modified Fluid consistency: Thin; Room service appropriate? Yes  Diet effective now               Disposition Plan  : SNF-social worker following-awaiting bed/discussion with family  Consults  :  None  Procedures  :  None  Antibiotics  :    Anti-infectives (From admission, onward)   None      Inpatient Medications  Scheduled Meds:  aspirin  81 mg Oral Daily   clopidogrel  75 mg Oral Daily   escitalopram  10 mg Oral Daily   fentaNYL  1 patch Transdermal Q72H   gabapentin  600 mg Oral BID   heparin  5,000 Units Subcutaneous Q8H   insulin aspart  0-9 Units Subcutaneous TID WC   insulin aspart  4 Units Subcutaneous TID WC   insulin glargine  10 Units Subcutaneous BID   isosorbide mononitrate  30 mg Oral Daily   magnesium oxide  400 mg Oral Daily   [START ON 09/07/2018] metoprolol succinate  12.5 mg Oral Daily   pantoprazole  40 mg Oral BID   polyethylene glycol  17 g Oral Daily   sodium chloride flush  10-40 mL Intracatheter Q12H   Continuous Infusions:  PRN Meds:.acetaminophen, fluticasone, HYDROmorphone, nitroGLYCERIN, ondansetron,  promethazine, sodium chloride flush   Time Spent in minutes  25  See all Orders from today for further details   Oren Binet M.D on 09/06/2018 at 11:19 AM  To page go to [email or web address] - use  universal password  [facility] -  Office  [phone]    Objective:   Vitals:   09/05/18 1926 09/06/18 0423 09/06/18 0701 09/06/18 0800  BP: (!) 123/53 (!) 151/52  (!) 137/56  Pulse: 66 (!) 53  63  Resp: _0 Temp: 98.1 F (36.7 C) 97.6 F (36.4 C)  (!) 97.5 F (36.4 C)  TempSrc: Oral Oral  Oral  SpO2: 96% 99% 96% 99%  Weight:      Height:        Wt Readings from Last 3 Encounters:  08/28/18 70.3 kg  08/23/18 68 kg  01/11/18 72.6 kg    No intake or output data in the 24 hours ending 09/06/18 1119   Physical Exam Gen Exam:Alert awake-not in any distress.  Appears frail and chronically sick appearing HEENT:atraumatic, normocephalic Chest: B/L clear to auscultation anteriorly CVS:S1S2 regular Abdomen:soft non tender, non distended Extremities:no edema Neurology: Non focal Skin: no rash   Data Review:    CBC Recent Labs  Lab 08/31/18 0345 09/01/18 0445 09/02/18 0400 09/03/18 0600  WBC 8.3 8.4 6.9 9.2  HGB 10.0* 9.7* 10.4* 9.5*  HCT 32.0* 30.8* 33.3* 30.2*  PLT 292 319 332 332  MCV 78.4* 78.0* 77.4* 77.4*  MCH 24.5* 24.6* 24.2* 24.4*  MCHC 31.3 31.5 31.2 31.5  RDW 14.6 14.8 14.9 14.7    Chemistries  Recent Labs  Lab 08/31/18 0345 09/01/18 0445 09/02/18 0400 09/03/18 0600 09/04/18 0516 09/05/18 0440  NA 131* 132* 131* 131*  --   --   K 3.7 4.0 4.5 4.4  --   --   CL 98 100 97* 99  --   --   CO2 21* _1 --   --   GLUCOSE 91 75 203* 314*  --   --   BUN 18 17 28* 37*  --   --   CREATININE 1.07* 0.99 0.98 1.14*  --   --   CALCIUM 9.1 9.4 10.2 9.7  --   --   MG  --   --   --  2.0 2.0 2.0  AST 23 33 42* 25  --   --   ALT _2 --   --   ALKPHOS 50 47 53 47  --   --   BILITOT 0.5 0.4 0.8 0.4  --   --    ------------------------------------------------------------------------------------------------------------------ No results for input(s): CHOL, HDL, LDLCALC, TRIG, CHOLHDL, LDLDIRECT in the last 72 hours.  Lab  Results  Component Value Date   HGBA1C 7.9 (H) 08/27/2018   ------------------------------------------------------------------------------------------------------------------ Recent Labs    09/06/18 0425  TSH 1.137   ------------------------------------------------------------------------------------------------------------------ Recent Labs    09/04/18 0516 09/05/18 0440  FERRITIN 173 130    Coagulation profile No results for input(s): INR, PROTIME in the last 168 hours.  Recent Labs    09/04/18 0516 09/05/18 0440  DDIMER 2.17* 1.65*    Cardiac Enzymes No results for input(s): CKMB, TROPONINI, MYOGLOBIN in the last 168 hours.  Invalid input(s): CK ------------------------------------------------------------------------------------------------------------------    Component Value Date/Time   BNP 438.8 (H) 01/13/2016 3491    Micro Results Recent Results (from the past 240 hour(s))  Novel Coronavirus, NAA (hospital order; send-out to ref lab)  Status: Abnormal   Collection Time: 09/03/18  4:22 PM   Specimen: Nasopharyngeal Swab; Respiratory  Result Value Ref Range Status   SARS-CoV-2, NAA DETECTED (A) NOT DETECTED Final    Comment: RESULT CALLED TO, READ BACK BY AND VERIFIED WITH: J.BOYER AT 2992 ON 09/05/18 BY N.THOMPSON (NOTE)                  Client Requested Flag This test was developed and its performance characteristics determined by [employer]. This test has not been FDA cleared or approved. This test has been authorized by FDA under an Emergency Use Authorization (EUA). This test is only authorized for the duration of time the declaration that circumstances exist justifying the authorization of the emergency use of in vitro diagnostic tests for detection of SARS-CoV-2 virus and/or diagnosis of COVID-19 infection under section 564(b)(1) of the Act, 21 U.S.C. 426STM-1(D)(6), unless the authorization is terminated or revoked sooner. When  diagnostic testing is negative, the possibility of a false negative result should be considered in the context of a patient's recent exposures and the presence of clinical signs and symptoms consistent with COVID-19. An individual without symptoms of  COVID-19 and who is not shedding SARS-CoV-2 virus would expect to have a negative (not detected) result in this assay. Performed At: [facility] [place], [place] [id number] Rush Farmer MD JJ:[id number]    [place]  Final    Comment: Performed at [facility] [address] [place], [place] [address]    Radiology Reports Ct Head Wo Contrast  Result Date: 08/26/2018 CLINICAL DATA:  Altered mental status. Nausea.  COVID-19 positive EXAM: CT HEAD WITHOUT CONTRAST TECHNIQUE: Contiguous axial images were obtained from the base of the skull through the vertex without intravenous contrast. COMPARISON:  Head CT 10/06/2016 FINDINGS: Brain: Patient had difficulty tolerating the exam, mild motion artifact. No intracranial hemorrhage, mass effect, or midline shift. No hydrocephalus. The basilar cisterns are patent. No evidence of territorial infarct or acute ischemia. No extra-axial or intracranial fluid collection. Unchanged degree of generalized atrophy. Mild chronic small vessel ischemia. Vascular: Atherosclerosis of skullbase vasculature without hyperdense vessel or abnormal calcification. Skull: No fracture or focal lesion. Sinuses/Orbits: Mucosal thickening of ethmoid air cells. No fluid levels. Mastoid air cells are clear. Bilateral cataract resection. Other: None. IMPRESSION: 1. No acute intracranial abnormality. 2. Unchanged atrophy and chronic small vessel ischemia. Electronically Signed   By: Keith Rake M.D.   On: 08/26/2018 23:39   Dg Chest Portable 1 View  Result Date: 08/26/2018 CLINICAL DATA:  Nausea. Poor p.o. intake. Vomiting. Diagnosed with COVID-19 5 days ago.  EXAM: PORTABLE CHEST 1 VIEW COMPARISON:  08/23/2018. FINDINGS: Normal sized heart. Mildly tortuous and calcified thoracic aorta. Stable mild diffuse peribronchial thickening and accentuation of the interstitial markings. Interval minimal patchy opacity in the lateral aspect of the right mid upper lung zone. Mild linear densities in both lungs with mild progression. Stable post CABG changes, lower thoracic spine degenerative changes and diffuse osteopenia. A stat after a IMPRESSION: 1. Interval minimal patchy opacity in right mid upper lung zone laterally. This may represent early lung changes due to the patient's COVID-19. 2. Mildly progressive linear atelectasis in both lungs. 3. Stable mild chronic bronchitic changes. Electronically Signed   By: Claudie Revering M.D.   On: 08/26/2018 21:49   Dg Chest Portable 1 View  Result Date: 08/23/2018 CLINICAL DATA:  Nausea.  COVID-19 positive. EXAM: PORTABLE CHEST 1 VIEW COMPARISON:  January 11, 2018 FINDINGS: The heart size and mediastinal contours are stable. Both lungs are clear. The visualized skeletal structures are stable. IMPRESSION: No acute cardiopulmonary disease identified. Electronically Signed   By: Abelardo Diesel M.D.   On: 08/23/2018 08:39   Dg Abd 2 Views  Result Date: 08/26/2018 CLINICAL DATA:  Nausea. Poor oral intake. Diagnosed with COVID-19 5 days ago. Three episodes of vomiting today. EXAM: ABDOMEN - 2 VIEW COMPARISON:  10/04/2016. FINDINGS: Normal bowel gas pattern. No significant stool. Stable gastric band. Interval 4 mm left mid abdominal calcification. Stable thoracolumbar scoliosis and degenerative changes. Atheromatous arterial calcifications without visible aneurysm. Post CABG changes. Bilateral proximal thigh vascular stents. IMPRESSION: 1. No acute abnormality. 2. Interval 4 mm left mid abdominal calcification. This could represent a phlebolith or urinary tract calcification. Electronically Signed   By: Claudie Revering M.D.   On: 08/26/2018  21:45

## 2018-09-07 LAB — GLUCOSE, CAPILLARY
Glucose-Capillary: 95 mg/dL (ref 70–99)
Glucose-Capillary: 106 mg/dL — ABNORMAL HIGH (ref 70–99)
Glucose-Capillary: 90 mg/dL (ref 70–99)
Glucose-Capillary: 308 mg/dL — ABNORMAL HIGH (ref 70–99)

## 2018-09-07 MED ORDER — HYDROMORPHONE HCL 2 MG PO TABS: 2.0000 mg | ORAL_TABLET | Freq: Four times a day (QID) | ORAL | 0 refills | Status: DC | PRN

## 2018-09-07 MED ORDER — INSULIN GLARGINE 100 UNIT/ML ~~LOC~~ SOLN
12.0000 [IU] | Freq: Every day | SUBCUTANEOUS | Status: DC
  Administered 2018-09-07: 12 [IU] via SUBCUTANEOUS
  Filled 2018-09-07 (×2): qty 0.12

## 2018-09-07 MED ORDER — ISOSORBIDE MONONITRATE ER 30 MG PO TB24: 30.0000 mg | ORAL_TABLET | Freq: Every day | ORAL | Status: AC

## 2018-09-07 MED ORDER — FENTANYL 50 MCG/HR TD PT72: 1.0000 | MEDICATED_PATCH | TRANSDERMAL | 0 refills | Status: AC

## 2018-09-07 NOTE — Care Management Important Message (Signed)
Important Message  Patient Details  Name: [name] MRN: [id number] Date of Birth: [birth year]   Medicare Important Message Given:  No Tried to call the patient son and not get an answer on 09/05/2018    Orbie Pyo 09/07/2018, 2:13 PM

## 2018-09-07 NOTE — Progress Notes (Signed)
Patient son returned phone call, updated on patients condition. Knowing that his mother is able to ambulate with a walker, he is comfortable with the patient discharging to home with PT/OT

## 2018-09-07 NOTE — Progress Notes (Signed)
Attempted to update family, no answer. Left callback number on voicemail

## 2018-09-07 NOTE — Progress Notes (Signed)
Case discussed with Nathaniel Man Asst Director of SW; Manuela Schwartz CM informed to administer IM ( Important Message) to the son to inform him of his mothers rights and or right to Appeal. Mindi Slicker RN,MHA,BSN

## 2018-09-07 NOTE — Care Management (Signed)
Case manager has spoken with Nathaniel Man, Assistant Director and Olga Coaster, Supervisor concerning this patient. Asked Zack to contact patient's son, Zaia Vala. CM was asked to see that IM is emailed to [employer], multiple calls have been made and voice messages left to obtain email address. On 09/05/18 CMA, Iris Bratton also left a voice message concerning IM.     Ricki Miller, RN BSN Case Manager [phone]

## 2018-09-07 NOTE — Progress Notes (Signed)
Physical Therapy Treatment Patient Details Name: [name] MRN: [id number] DOB: 1940-06-20 Today's Date: 09/07/2018    History of Present Illness Pt is a 78 y.o. female with PMH significant for Anxiety, Arthritis, CAD, CHF, Chronic pain, HTN, MI (08/2015), Multiple myeloma (dx'd 2015), PAD, Stroke, and DM2; positive SARS-CoV-2 on 08/17/2018,experiencing severe nausea without vomiting, decreased activity tolerance, fatigue.    PT Comments    Patient was a bit brighter and stronger today. She had participated in OT session earlier today and had posterior LOB at sink. She did not have LOB during PT session and stated she "does better in the afternoons. I was too cold this morning to function." Recommend PT or OT see 8/28 in the a.m. to again compare her morning performance to this afternoon.     Follow Up Recommendations  SNF does not have 24/7 supervision per patient and currently needs due to fall risk     Equipment Recommendations  None recommended by PT    Recommendations for Other Services       Precautions / Restrictions Precautions Precautions: Fall Restrictions Weight Bearing Restrictions: No    Mobility  Bed Mobility Overal bed mobility: Modified Independent Bed Mobility: Supine to Sit;Sit to Supine     Supine to sit: Modified independent (Device/Increase time) Sit to supine: Modified independent (Device/Increase time)      Transfers Overall transfer level: Needs assistance Equipment used: Rolling walker (2 wheeled) Transfers: Sit to/from Stand Sit to Stand: Min guard;Supervision Stand pivot transfers: Min assist       General transfer comment: initial stand from bed minguard; from toilet with grab bar with superfvision  Ambulation/Gait Ambulation/Gait assistance: Min guard Gait Distance (Feet): 25 Feet(x 2; seated rest between) Assistive device: Rolling walker (2 wheeled) Gait Pattern/deviations: Step-through pattern;Decreased stride length;Trunk  flexed     General Gait Details: Pt much stronger with improved balance today (and had walked, participated in activity twice already today prior to PT arrived).    Stairs             Wheelchair Mobility    Modified Rankin (Stroke Patients Only)       Balance Overall balance assessment: Needs assistance Sitting-balance support: Feet supported;No upper extremity supported Sitting balance-Leahy Scale: Good Sitting balance - Comments: at edge of bed   Standing balance support: No upper extremity supported;During functional activity Standing balance-Leahy Scale: Fair Standing balance comment: washing hands at sink without lower body against surface                            Cognition Arousal/Alertness: Awake/alert Behavior During Therapy: Flat affect Overall Cognitive Status: No family/caregiver present to determine baseline cognitive functioning                     Current Attention Level: Selective   Following Commands: Follows one step commands consistently Safety/Judgement: Decreased awareness of deficits Awareness: Emergent          Exercises      General Comments General comments (skin integrity, edema, etc.): Patient reports she is not back to her baseline, however feels she is safe to go home with intermittent assistance. Reminded her of needing assist this a.m. with OT and she stated she was cold this a.m. and could not function right.       Pertinent Vitals/Pain Pain Assessment: No/denies pain    Home Living Family/patient expects to be discharged to:: Private residence Living Arrangements: Alone  Available Help at Discharge: Family;Available 24 hours/day Type of Home: House Home Access: Ramped entrance   Home Layout: One level Home Equipment: Walker - 2 wheels;Bedside commode;Electric scooter;Shower seat;Grab bars - tub/shower      Prior Function Level of Independence: Independent with assistive device(s)      Comments:  uses RW for mobility in home and scooter for community   PT Goals (current goals can now be found in the care plan section) Acute Rehab PT Goals Patient Stated Goal: be able to go home with intermittent assistance Time For Goal Achievement: 09/14/18 Potential to Achieve Goals: Good Progress towards PT goals: Progressing toward goals    Frequency    Min 3X/week(family wants more; may refuse SNf)      PT Plan Current plan remains appropriate    Co-evaluation              AM-PAC PT "6 Clicks" Mobility   Outcome Measure  Help needed turning from your back to your side while in a flat bed without using bedrails?: None Help needed moving from lying on your back to sitting on the side of a flat bed without using bedrails?: None Help needed moving to and from a bed to a chair (including a wheelchair)?: A Little Help needed standing up from a chair using your arms (e.g., wheelchair or bedside chair)?: A Little Help needed to walk in hospital room?: A Little Help needed climbing 3-5 steps with a railing? : Total 6 Click Score: 18    End of Session   Activity Tolerance: Patient tolerated treatment well Patient left: with call bell/phone within reach;in bed(reported she was up in chair all day and asked to lie down) Nurse Communication: Mobility status PT Visit Diagnosis: Muscle weakness (generalized) (M62.81);Difficulty in walking, not elsewhere classified (R26.2);Pain Pain - part of body: (abdomen)     Time: 0721-8288 PT Time Calculation (min) (ACUTE ONLY): 15 min  Charges:  $Gait Training: 8-22 mins                       Barry Brunner, PT       Rexanne Mano 09/07/2018, 5:37 PM

## 2018-09-07 NOTE — Discharge Summary (Addendum)
PATIENT DETAILS Name: [name] Age: 78 y.o. Sex: female Date of Birth: 08/05/1940 MRN: [id number]. Admitting Physician: Phillips Grout, MD ELF:YBOFB, Lynnell Jude, MD  Admit Date: 08/27/2018 Discharge date: 09/08/2018  Recommendations for Outpatient Follow-up:  1. Follow up with PCP in 1-2 weeks 2. Please obtain BMP/CBC in one week  Admitted From:  Home  Disposition: Home with home health [place]: yes  Equipment/Devices: None  Discharge Condition: Stable  CODE STATUS: FULL CODE  Diet recommendation:  Diet Order            Diet - low sodium heart healthy        Diet Carb Modified        Diet Carb Modified Fluid consistency: Thin; Room service appropriate? Yes  Diet effective now               Brief Summary: See H&P, Labs, Consult and Test reports for all details in brief, Patient is a 79 y.o. female with PMHx of chronic diastolic heart failure, CAD s/p CABG 2017, PAD s/p left fempo bypass in 2013, history of multiple myeloma in remission, HTN, DM-2, s/p lap band bariatric surgery 2007, chronic pain-presented with vomiting-she was recently diagnosed with COVID-19 on 8/6 (at [facility]-results in care everywhere).  She was subsequently admitted to the hospitalist service.  Brief Hospital Course: Intractable nausea and vomiting: Resolved-no nausea vomiting for the past several days.  Tolerating diet.  Suspect has some amount of chronic intermittent nausea baseline-given her prior history of lap band surgery-she may have some subtle gastroparesis at baseline that could have worsened with acute viral illness.  Continue prn antiemetics and other supportive measures.   COVID-19 pneumonia: Remains essentially asymptomatic for the past few days-no significant cough-on room air.  Given weakness/failure to thrive syndrome-and persistently elevated inflammatory markers-started on Decadron with significant improvement.  Inflammatory markers have decreased  significantly-Decadron has been tapered down-last dose on 8/26.  Repeat COVID PCR on 8/23 remains positive.  Hypertension: Controlled-continue Imdur- and metoprolol (dose decreased to 12.5 mg for asymptomatic nocturnal bradycardia-now stable)   DM-2: CBGs stable-resume usual insulin regimen on discharge.  Patient had some mild hyperglycemia secondary to steroids during this hospital stay-insulin dosage was adjusted accordingly.  History of PAD/CAD s/p CABG: Currently asymptomatic-continue antiplatelets.  Asymptomatic bacteriuria: With no symptoms of UTI (denies dysuria or frequency)-UA essentially unremarkable-however urine culture drawn in the emergency room on 8/15 shows ESBL E. coli.  Since thought to have asymptomatic bacteriuria rather than UTI-no indications to start antibiotics  Deconditioning/debility/failure to thrive syndrome: Continues to exhibit failure to thrive syndrome-have encouraged patient to ambulate with PT, out of bed to chair-but seems to have perked up a little bit after starting her on steroids-we will plan a few day course.  PT following-with recommendations for SNF-social worker following with family.  Note: Patient's son was very hesitant to either discharge patient home with home health services or go to a SNF (see prior notes)-case management/social work was working with the family.  I was informed by RN on 8/27-the patient's son called her and wanted patient to be discharged home with home health services.  I have tried to reach out to the patient's son this morning-I have left him a Advertising account executive.  Tentative plans are for her to go home with [facility] home health services.  Procedures/Studies: None  Discharge Diagnoses:  Active Problems:   Type 2 diabetes mellitus (HCC)   Hypertension   History of critical ischemia of  lower extremity   Severe nausea   Hyponatremia   Microcytic anemia   Nausea   Gastroenteritis due to COVID-19 virus   Discharge  Instructions:  Activity:  As tolerated  Discharge Instructions    Call MD for:  difficulty breathing, headache or visual disturbances   Complete by: As directed    Call MD for:  persistant dizziness or light-headedness   Complete by: As directed    Call MD for:  persistant nausea and vomiting   Complete by: As directed    Diet - low sodium heart healthy   Complete by: As directed    Diet Carb Modified   Complete by: As directed    Increase activity slowly   Complete by: As directed      Allergies as of 09/08/2018      Reactions   Benzodiazepines Anxiety, Other (See Comments)   Confusion, also   Other Swelling   Smoke--Head swelling   Statins Swelling   "Head swells"   Ticagrelor Shortness Of Breath   Metoclopramide Other (See Comments)   Causes tremors and insomnia   Amoxicillin Hives, Rash   Codeine Hives, Rash   Duloxetine Nausea Only   Sulfa Antibiotics Hives, Rash      Medication List    STOP taking these medications   furosemide 20 MG tablet Commonly known as: LASIX   lisinopril 10 MG tablet Commonly known as: ZESTRIL   spironolactone 25 MG tablet Commonly known as: ALDACTONE   tamsulosin 0.4 MG Caps capsule Commonly known as: FLOMAX     TAKE these medications   aspirin 81 MG tablet Take 81 mg by mouth daily.   clopidogrel 75 MG tablet Commonly known as: PLAVIX Take 75 mg by mouth daily.   Cyanocobalamin 1000 MCG/ML Kit Inject 1,000 mcg as directed every Wednesday.   diclofenac sodium 1 % Gel Commonly known as: VOLTAREN Apply 2 g topically daily as needed (to affected, painful sites).   escitalopram 10 MG tablet Commonly known as: LEXAPRO Take 10 mg by mouth daily.   famotidine 20 MG tablet Commonly known as: Pepcid Take 1 tablet (20 mg total) by mouth daily.   fentaNYL 50 MCG/HR Commonly known as: [place] 1 patch onto the skin every 3 (three) days. What changed: how much to take   gabapentin 300 MG capsule Commonly known  as: NEURONTIN Take 600 mg by mouth See admin instructions. Take 600 mg by mouth in the morning, 600 mg at noontime, and 600 mg at bedtime   HYDROmorphone 2 MG tablet Commonly known as: DILAUDID Take 1 tablet (2 mg total) by mouth every 6 (six) hours as needed (breakthrough pain).   isosorbide mononitrate 30 MG 24 hr tablet Commonly known as: IMDUR Take 1 tablet (30 mg total) by mouth daily. Please make yearly appt with Dr. Meda Coffee for September before anymore refills. 1st attempt What changed:   medication strength  how much to take   Lantus SoloStar 100 UNIT/ML Solostar Pen Generic drug: Insulin Glargine Inject 18 Units into the skin at bedtime.   magnesium oxide 400 MG tablet Commonly known as: MAG-OX Take 400 mg by mouth daily as needed (to increase levels).   metoprolol tartrate 25 MG tablet Commonly known as: LOPRESSOR Take 0.5 tablets (12.5 mg total) by mouth 2 (two) times daily.   nitroGLYCERIN 0.4 MG SL tablet Commonly known as: NITROSTAT Place 1 tablet (0.4 mg total) under the tongue every 5 (five) minutes as needed for chest pain.   ondansetron  4 MG disintegrating tablet Commonly known as: Zofran ODT Take 1 tablet (4 mg total) by mouth every 8 (eight) hours as needed for nausea or vomiting.   pantoprazole 40 MG tablet Commonly known as: PROTONIX Take 1 tablet (40 mg total) by mouth daily at 6 (six) AM.   Praluent 75 MG/ML Sopn Generic drug: Alirocumab Inject 1 pen into the skin every 14 (fourteen) days.   promethazine 12.5 MG tablet Commonly known as: PHENERGAN Take 1 tablet (12.5 mg total) by mouth every 6 (six) hours. What changed:   when to take this  reasons to take this   Vitamin D (Ergocalciferol) 1.25 MG (50000 UT) Caps capsule Commonly known as: DRISDOL Take 50,000 Units by mouth 2 (two) times a week. Mondays and Thursdays      Follow-up Information    Lynnell Jude, MD. Schedule an appointment as soon as possible for a visit in 1 week(s).    Specialty: Family Medicine Contact information: [place] [address] [phone]          Allergies  Allergen Reactions   Benzodiazepines Anxiety and Other (See Comments)    Confusion, also   Other Swelling    Smoke--Head swelling   Statins Swelling    "Head swells"   Ticagrelor Shortness Of Breath   Metoclopramide Other (See Comments)    Causes tremors and insomnia   Amoxicillin Hives and Rash   Codeine Hives and Rash   Duloxetine Nausea Only   Sulfa Antibiotics Hives and Rash    Consultations:   None   Other Procedures/Studies: Ct Head Wo Contrast  Result Date: 08/26/2018 CLINICAL DATA:  Altered mental status. Nausea.  COVID-19 positive EXAM: CT HEAD WITHOUT CONTRAST TECHNIQUE: Contiguous axial images were obtained from the base of the skull through the vertex without intravenous contrast. COMPARISON:  Head CT 10/06/2016 FINDINGS: Brain: Patient had difficulty tolerating the exam, mild motion artifact. No intracranial hemorrhage, mass effect, or midline shift. No hydrocephalus. The basilar cisterns are patent. No evidence of territorial infarct or acute ischemia. No extra-axial or intracranial fluid collection. Unchanged degree of generalized atrophy. Mild chronic small vessel ischemia. Vascular: Atherosclerosis of skullbase vasculature without hyperdense vessel or abnormal calcification. Skull: No fracture or focal lesion. Sinuses/Orbits: Mucosal thickening of ethmoid air cells. No fluid levels. Mastoid air cells are clear. Bilateral cataract resection. Other: None. IMPRESSION: 1. No acute intracranial abnormality. 2. Unchanged atrophy and chronic small vessel ischemia. Electronically Signed   By: Keith Rake M.D.   On: 08/26/2018 23:39   Dg Chest Portable 1 View  Result Date: 08/26/2018 CLINICAL DATA:  Nausea. Poor p.o. intake. Vomiting. Diagnosed with COVID-19 5 days ago. EXAM: PORTABLE CHEST 1 VIEW COMPARISON:  08/23/2018. FINDINGS:  Normal sized heart. Mildly tortuous and calcified thoracic aorta. Stable mild diffuse peribronchial thickening and accentuation of the interstitial markings. Interval minimal patchy opacity in the lateral aspect of the right mid upper lung zone. Mild linear densities in both lungs with mild progression. Stable post CABG changes, lower thoracic spine degenerative changes and diffuse osteopenia. A stat after a IMPRESSION: 1. Interval minimal patchy opacity in right mid upper lung zone laterally. This may represent early lung changes due to the patient's COVID-19. 2. Mildly progressive linear atelectasis in both lungs. 3. Stable mild chronic bronchitic changes. Electronically Signed   By: Claudie Revering M.D.   On: 08/26/2018 21:49   Dg Chest Portable 1 View  Result Date: 08/23/2018 CLINICAL DATA:  Nausea.  COVID-19 positive. EXAM: PORTABLE  CHEST 1 VIEW COMPARISON:  January 11, 2018 FINDINGS: The heart size and mediastinal contours are stable. Both lungs are clear. The visualized skeletal structures are stable. IMPRESSION: No acute cardiopulmonary disease identified. Electronically Signed   By: Abelardo Diesel M.D.   On: 08/23/2018 08:39   Dg Abd 2 Views  Result Date: 08/26/2018 CLINICAL DATA:  Nausea. Poor oral intake. Diagnosed with COVID-19 5 days ago. Three episodes of vomiting today. EXAM: ABDOMEN - 2 VIEW COMPARISON:  10/04/2016. FINDINGS: Normal bowel gas pattern. No significant stool. Stable gastric band. Interval 4 mm left mid abdominal calcification. Stable thoracolumbar scoliosis and degenerative changes. Atheromatous arterial calcifications without visible aneurysm. Post CABG changes. Bilateral proximal thigh vascular stents. IMPRESSION: 1. No acute abnormality. 2. Interval 4 mm left mid abdominal calcification. This could represent a phlebolith or urinary tract calcification. Electronically Signed   By: Claudie Revering M.D.   On: 08/26/2018 21:45     TODAY-DAY OF DISCHARGE:  Subjective:   Fleda Cona  today has no headache,no chest abdominal pain,no new weakness tingling or numbness, feels much better wants to go home today.   Objective:   Blood pressure (!) 143/59, pulse 67, temperature 97.9 F (36.6 C), temperature source Oral, resp. rate 17, height 5' 2" (1.575 m), weight 70.3 kg, SpO2 100 %.  Intake/Output Summary (Last 24 hours) at 09/08/2018 0903 Last data filed at 09/07/2018 0930 Gross per 24 hour  Intake 240 ml  Output --  Net 240 ml   Filed Weights   08/28/18 1926  Weight: 70.3 kg    Exam: Awake Alert, Oriented *3, No new F.N deficits, Normal affect Wing.AT,PERRAL Supple Neck,No JVD, No cervical lymphadenopathy appriciated.  Symmetrical Chest wall movement, Good air movement bilaterally, CTAB RRR,No Gallops,Rubs or new Murmurs, No Parasternal Heave +ve B.Sounds, Abd Soft, Non tender, No organomegaly appriciated, No rebound -guarding or rigidity. No Cyanosis, Clubbing or edema, No new Rash or bruise   PERTINENT RADIOLOGIC STUDIES: Ct Head Wo Contrast  Result Date: 08/26/2018 CLINICAL DATA:  Altered mental status. Nausea.  COVID-19 positive EXAM: CT HEAD WITHOUT CONTRAST TECHNIQUE: Contiguous axial images were obtained from the base of the skull through the vertex without intravenous contrast. COMPARISON:  Head CT 10/06/2016 FINDINGS: Brain: Patient had difficulty tolerating the exam, mild motion artifact. No intracranial hemorrhage, mass effect, or midline shift. No hydrocephalus. The basilar cisterns are patent. No evidence of territorial infarct or acute ischemia. No extra-axial or intracranial fluid collection. Unchanged degree of generalized atrophy. Mild chronic small vessel ischemia. Vascular: Atherosclerosis of skullbase vasculature without hyperdense vessel or abnormal calcification. Skull: No fracture or focal lesion. Sinuses/Orbits: Mucosal thickening of ethmoid air cells. No fluid levels. Mastoid air cells are clear. Bilateral cataract resection. Other: None.  IMPRESSION: 1. No acute intracranial abnormality. 2. Unchanged atrophy and chronic small vessel ischemia. Electronically Signed   By: Keith Rake M.D.   On: 08/26/2018 23:39   Dg Chest Portable 1 View  Result Date: 08/26/2018 CLINICAL DATA:  Nausea. Poor p.o. intake. Vomiting. Diagnosed with COVID-19 5 days ago. EXAM: PORTABLE CHEST 1 VIEW COMPARISON:  08/23/2018. FINDINGS: Normal sized heart. Mildly tortuous and calcified thoracic aorta. Stable mild diffuse peribronchial thickening and accentuation of the interstitial markings. Interval minimal patchy opacity in the lateral aspect of the right mid upper lung zone. Mild linear densities in both lungs with mild progression. Stable post CABG changes, lower thoracic spine degenerative changes and diffuse osteopenia. A stat after a IMPRESSION: 1. Interval minimal patchy opacity in right mid  upper lung zone laterally. This may represent early lung changes due to the patient's COVID-19. 2. Mildly progressive linear atelectasis in both lungs. 3. Stable mild chronic bronchitic changes. Electronically Signed   By: Claudie Revering M.D.   On: 08/26/2018 21:49   Dg Chest Portable 1 View  Result Date: 08/23/2018 CLINICAL DATA:  Nausea.  COVID-19 positive. EXAM: PORTABLE CHEST 1 VIEW COMPARISON:  January 11, 2018 FINDINGS: The heart size and mediastinal contours are stable. Both lungs are clear. The visualized skeletal structures are stable. IMPRESSION: No acute cardiopulmonary disease identified. Electronically Signed   By: Abelardo Diesel M.D.   On: 08/23/2018 08:39   Dg Abd 2 Views  Result Date: 08/26/2018 CLINICAL DATA:  Nausea. Poor oral intake. Diagnosed with COVID-19 5 days ago. Three episodes of vomiting today. EXAM: ABDOMEN - 2 VIEW COMPARISON:  10/04/2016. FINDINGS: Normal bowel gas pattern. No significant stool. Stable gastric band. Interval 4 mm left mid abdominal calcification. Stable thoracolumbar scoliosis and degenerative changes. Atheromatous arterial  calcifications without visible aneurysm. Post CABG changes. Bilateral proximal thigh vascular stents. IMPRESSION: 1. No acute abnormality. 2. Interval 4 mm left mid abdominal calcification. This could represent a phlebolith or urinary tract calcification. Electronically Signed   By: Claudie Revering M.D.   On: 08/26/2018 21:45     PERTINENT LAB RESULTS: CBC: No results for input(s): WBC, HGB, HCT, PLT in the last 72 hours. CMET CMP     Component Value Date/Time   NA 131 (L) 09/03/2018 0600   NA 138 06/03/2016 1102   K 4.4 09/03/2018 0600   CL 99 09/03/2018 0600   CO2 26 09/03/2018 0600   GLUCOSE 314 (H) 09/03/2018 0600   BUN 37 (H) 09/03/2018 0600   BUN 18 06/03/2016 1102   CREATININE 1.14 (H) 09/03/2018 0600   CALCIUM 9.7 09/03/2018 0600   PROT 6.2 (L) 09/03/2018 0600   PROT 6.2 06/03/2016 1102   ALBUMIN 2.4 (L) 09/03/2018 0600   ALBUMIN 4.3 06/03/2016 1102   AST 25 09/03/2018 0600   ALT 20 09/03/2018 0600   ALKPHOS 47 09/03/2018 0600   BILITOT 0.4 09/03/2018 0600   BILITOT <0.2 06/03/2016 1102   GFRNONAA 46 (L) 09/03/2018 0600   GFRAA 54 (L) 09/03/2018 0600    GFR Estimated Creatinine Clearance: 38 mL/min (A) (by C-G formula based on SCr of 1.14 mg/dL (H)). No results for input(s): LIPASE, AMYLASE in the last 72 hours. No results for input(s): CKTOTAL, CKMB, CKMBINDEX, TROPONINI in the last 72 hours. Invalid input(s): POCBNP No results for input(s): DDIMER in the last 72 hours. No results for input(s): HGBA1C in the last 72 hours. No results for input(s): CHOL, HDL, LDLCALC, TRIG, CHOLHDL, LDLDIRECT in the last 72 hours. Recent Labs    09/06/18 0425  TSH 1.137   No results for input(s): VITAMINB12, FOLATE, FERRITIN, TIBC, IRON, RETICCTPCT in the last 72 hours. Coags: No results for input(s): INR in the last 72 hours.  Invalid input(s): PT Microbiology: Recent Results (from the past 240 hour(s))  Novel Coronavirus, NAA (hospital order; send-out to ref lab)      Status: Abnormal   Collection Time: 09/03/18  4:22 PM   Specimen: Nasopharyngeal Swab; Respiratory  Result Value Ref Range Status   SARS-CoV-2, NAA DETECTED (A) NOT DETECTED Final    Comment: RESULT CALLED TO, READ BACK BY AND VERIFIED WITH: J.BOYER AT 0973 ON 09/05/18 BY N.THOMPSON (NOTE)  Client Requested Flag This test was developed and its performance characteristics determined by [employer]. This test has not been FDA cleared or approved. This test has been authorized by FDA under an Emergency Use Authorization (EUA). This test is only authorized for the duration of time the declaration that circumstances exist justifying the authorization of the emergency use of in vitro diagnostic tests for detection of SARS-CoV-2 virus and/or diagnosis of COVID-19 infection under section 564(b)(1) of the Act, 21 U.S.C. 549IYM-4(B)(5), unless the authorization is terminated or revoked sooner. When diagnostic testing is negative, the possibility of a false negative result should be considered in the context of a patient's recent exposures and the presence of clinical signs and symptoms consistent with COVID-19. An individual without symptoms of  COVID-19 and who is not shedding SARS-CoV-2 virus would expect to have a negative (not detected) result in this assay. Performed At: [facility] [place], [place] [id number] Rush Farmer MD GS:[id number]    [place]  Final    Comment: Performed at [facility] [address] [place], [place] [address]    FURTHER DISCHARGE INSTRUCTIONS:  Get Medicines reviewed and adjusted: Please take all your medications with you for your next visit with your Primary MD  Laboratory/radiological data: Please request your Primary MD to go over all hospital tests and procedure/radiological results at the follow up, please ask your Primary MD to get all Hospital records  sent to his/her office.  In some cases, they will be blood work, cultures and biopsy results pending at the time of your discharge. Please request that your primary care M.D. goes through all the records of your hospital data and follows up on these results.  Also Note the following: If you experience worsening of your admission symptoms, develop shortness of breath, life threatening emergency, suicidal or homicidal thoughts you must seek medical attention immediately by calling 911 or calling your MD immediately  if symptoms less severe.  You must read complete instructions/literature along with all the possible adverse reactions/side effects for all the Medicines you take and that have been prescribed to you. Take any new Medicines after you have completely understood and accpet all the possible adverse reactions/side effects.   Do not drive when taking Pain medications or sleeping medications (Benzodaizepines)  Do not take more than prescribed Pain, Sleep and Anxiety Medications. It is not advisable to combine anxiety,sleep and pain medications without talking with your primary care practitioner  Special Instructions: If you have smoked or chewed Tobacco  in the last 2 yrs please stop smoking, stop any regular Alcohol  and or any Recreational drug use.  Wear Seat belts while driving.  Please note: You were cared for by a hospitalist during your hospital stay. Once you are discharged, your primary care physician will handle any further medical issues. Please note that NO REFILLS for any discharge medications will be authorized once you are discharged, as it is imperative that you return to your primary care physician (or establish a relationship with a primary care physician if you do not have one) for your post hospital discharge needs so that they can reassess your need for medications and monitor your lab values.  Total Time spent coordinating discharge including counseling, education and  face to face time equals 35 minutes.  SignedOren Binet 09/08/2018 9:03 AM

## 2018-09-07 NOTE — Progress Notes (Signed)
Occupational Therapy Treatment Patient Details Name: [name] MRN: [id number] DOB: 01/11/1940 Today's Date: 09/07/2018    History of present illness Pt is a 78 y.o. female with PMH significant for Anxiety, Arthritis, CAD, CHF, Chronic pain, HTN, MI (08/2015), Multiple myeloma (dx'd 2015), PAD, Stroke, and DM2; positive SARS-CoV-2 on 08/17/2018,experiencing severe nausea without vomiting, decreased activity tolerance, fatigue.   OT comments  Pt making progress toward goals but is a high fall risk and not at her baseline level of function. Feel pt would benefit form rehab at SNF. Pt states her son is "going to take me home". If plan is for DC home, she will need assitance with all mobility and ADL, HHOT and HH Aide. Will continue to follow acutely.   Follow Up Recommendations  Supervision/Assistance - 24 hour;SNF;Other (comment)(HH if 24/7 S available)    Equipment Recommendations  None recommended by OT    Recommendations for Other Services PT consult    Precautions / Restrictions Precautions Precautions: Fall Restrictions Weight Bearing Restrictions: No       Mobility Bed Mobility Overal bed mobility: Modified Independent                Transfers Overall transfer level: Needs assistance Equipment used: Rolling walker (2 wheeled) Transfers: Sit to/from Stand;Stand Pivot Transfers Sit to Stand: Min assist Stand pivot transfers: Min assist       General transfer comment: physical assistance to stand from toilet    Balance Overall balance assessment: Needs assistance   Sitting balance-Leahy Scale: Good       Standing balance-Leahy Scale: Poor                             ADL either performed or assessed with clinical judgement   ADL       Grooming: Standing;Minimal assistance Grooming Details (indicate cue type and reason): LOB posteirorly at sink. REgained but at risk for falls Upper Body Bathing: Sitting;Set up   Lower Body Bathing:  Minimal assistance;Sit to/from stand   Upper Body Dressing : Set up;Sitting   Lower Body Dressing: Minimal assistance;Sit to/from stand               Functional mobility during ADLs: Minimal assistance;Cueing for safety;Rolling walker General ADL Comments: poor orientation with use of RW     Vision       Perception     Praxis      Cognition Arousal/Alertness: Awake/alert Behavior During Therapy: Flat affect Overall Cognitive Status: No family/caregiver present to determine baseline cognitive functioning                     Current Attention Level: Selective   Following Commands: Follows one step commands consistently Safety/Judgement: Decreased awareness of safety;Decreased awareness of deficits Awareness: Emergent            Exercises     Shoulder Instructions       General Comments      Pertinent Vitals/ Pain       Pain Assessment: No/denies pain  Home Living                                          Prior Functioning/Environment              Frequency  Min 2X/week          Progress Toward Goals  OT Goals(current goals can now be found in the care plan section)  Progress towards OT goals: Progressing toward goals  Acute Rehab OT Goals Patient Stated Goal: to get warm OT Goal Formulation: With patient Time For Goal Achievement: 09/13/18 Potential to Achieve Goals: Good ADL Goals Pt Will Perform Grooming: with supervision;standing Pt Will Perform Upper Body Dressing: with modified independence;sitting Pt Will Perform Lower Body Dressing: with supervision;sit to/from stand Pt Will Transfer to Toilet: with supervision;ambulating Pt Will Perform Toileting - Clothing Manipulation and hygiene: with modified independence;sit to/from stand Pt/caregiver will Perform Home Exercise Program: Increased strength;Both right and left upper extremity;Independently;With theraband;With written HEP provided Additional ADL Goal  #1: Pt will recall at least 3 ways of conserving energy during ADL routine at independent level  Plan Discharge plan remains appropriate    Co-evaluation                 AM-PAC OT "6 Clicks" Daily Activity     Outcome Measure   Help from another person eating meals?: None Help from another person taking care of personal grooming?: A Little Help from another person toileting, which includes using toliet, bedpan, or urinal?: A Little Help from another person bathing (including washing, rinsing, drying)?: A Little Help from another person to put on and taking off regular upper body clothing?: A Little Help from another person to put on and taking off regular lower body clothing?: A Lot 6 Click Score: 18    End of Session Equipment Utilized During Treatment: Rolling walker  OT Visit Diagnosis: Unsteadiness on feet (R26.81);Muscle weakness (generalized) (M62.81);Other symptoms and signs involving cognitive function   Activity Tolerance Patient tolerated treatment well   Patient Left in chair;with call bell/phone within reach   Nurse Communication Mobility status        Time: 1230-1250 OT Time Calculation (min): 20 min  Charges: OT General Charges $OT Visit: 1 Visit OT Treatments $Self Care/Home Management : 8-22 mins   Vivian, OT/L   Acute OT Clinical Specialist Acute Rehabilitation Services Pager [phone] Office [phone]    [name],[name] 09/07/2018, 2:06 PM    

## 2018-09-07 NOTE — Care Management (Signed)
1454 Case manager received return call from patient's son, [name] He provided his email address for IM to be sent: [email or web address]. Case manager provided this information CMA who will send IM. He remains adamant that his mom is not discharging.   [name] Case Manager [phone]

## 2018-09-07 NOTE — Care Management Important Message (Signed)
Important Message  Patient Details  Name: Taleigha Diebel Foglesong MRN: [id number] Date of Birth: 01-Feb-1940   Medicare Important Message Given:  Yes - Important Message mailed due to current National Emergency   Verbal consent obtained due to current National Emergency    Contact Name: Marlou Sa Tomasini Call Date: 09/07/18  Time: 1606 Phone: [phone] Outcome: (EMAIL: [email or web address])       Orbie Pyo 09/07/2018, 4:11 PM

## 2018-09-07 NOTE — Progress Notes (Signed)
PROGRESS NOTE                                                                                                                                                                                                             Patient Demographics:    [name], is a 78 y.o. female, DOB - 08-06-1940, [id number]  Outpatient Primary MD for the patient is Clemmie Krill Lynnell Jude, MD   Admit date - 08/27/2018   LOS - 10  No chief complaint on file.      Brief Narrative: Patient is a 78 y.o. female with PMHx of chronic diastolic heart failure, CAD s/p CABG 2017, PAD s/p left fempo bypass in 2013, history of multiple myeloma in remission, HTN, DM-2, s/p lap band bariatric surgery 2007, chronic pain-presented with vomiting-she was recently diagnosed with COVID-19 on 8/6 (at [facility]-results in care everywhere).  She was subsequently admitted to the hospitalist service.     Subjective:    [name] who was lying comfortably in bed-she denied any nausea, vomiting or shortness of breath.  She remains on room air.  Per RN staff-no major events overnight.   Assessment  & Plan :   Intractable nausea and vomiting: Resolved-no nausea vomiting for the past several days.  Tolerating diet.  Suspect has some amount of chronic intermittent nausea baseline-given her prior history of lap band surgery-she may have some subtle gastroparesis at baseline that could have worsened with acute viral illness.  Continue antiemetics and other supportive measures.   COVID-19 pneumonia: Remains essentially asymptomatic for the past few days-no significant cough-on room air.  Given weakness/failure to thrive syndrome-and persistently elevated inflammatory markers-started on Decadron with significant improvement.  Inflammatory markers have decreased significantly-Decadron has been tapered down-last dose on 8/26.  Repeat COVID PCR on 8/23 remains positive.  Fever: afebrile  O2  requirements: None  COVID-19 Labs: Recent Labs    09/05/18 0440  DDIMER 1.65*  FERRITIN 130  CRP 1.4*   COVID-19 Medications: Steroids: Decadron 8/21>> 8/26 Remdesivir:None Actemra:None Convalescent Plasma:N/A Research Studies:N/A  Other medications: Diuretics: Euvolemic-no indication for diuretics  Antibiotics:Not needed as no evidence of bacterial infection  DVT Prophylaxis  :  Heparin  Hypertension: Controlled-continue Imdur- and metoprolol (dose decreased to 12.5 mg for asymptomatic nocturnal bradycardia-reassess tomorrow)   DM-2: CBGs stable this morning-but since now steroids have decreased  significantly-suspect that insulin requirements will continue to come down as patient is now off steroids.  Decrease Lantus to 8 units twice daily, and continue with SSI.  CBG (last 3)  Recent Labs    09/06/18 2040 09/07/18 0738 09/07/18 1109  GLUCAP 179* 95 106*    History of PAD/CAD s/p CABG: Currently asymptomatic-continue antiplatelets.  Asymptomatic bacteriuria: With no symptoms of UTI (denies dysuria or frequency)-UA essentially unremarkable-however urine culture drawn in the emergency room on 8/15 shows ESBL E. coli.  Since thought to have asymptomatic bacteriuria rather than UTI-no indications to start antibiotics  Deconditioning/debility/failure to thrive syndrome: Continues to exhibit failure to thrive syndrome-have encouraged patient to ambulate with PT, out of bed to chair-but seems to have perked up a little bit after starting her on steroids-we will plan a few day course.  PT following-with recommendations for SNF-social worker following with family.  ABG:    Component Value Date/Time   TCO2 22 03/05/2010 1028    Vent Settings: N/A  Condition - Stable  Family Communication  : will update family tomorrow (med issues stable)-as CM/SW speaking with son today.  Spoke with patient's son on 8/26-explained that patient is more than stable for discharge.  He still  does not "feel comfortable" in either taking her home or getting her to go to SNF's that are taking go with positive patients(he is unhappy with the 2 facilities that have offered beds).  Explained to patient's son that this is an acute care facility-and that we have patients waiting for beds in this facility.  He is aware that once patient's are ready for discharge-they go to other facilities for rehab.  I have tried to reassure him-but at the same time have also explained to him that we are in the midst of a pandemic-and we have only a limited number of SNF that will take COVID positive patients.  Code Status :  Full Code  Diet :  Diet Order            Diet Carb Modified Fluid consistency: Thin; Room service appropriate? Yes  Diet effective now               Disposition Plan  : SNF-social worker following-awaiting bed/discussion with family  Consults  :  None  Procedures  :  None  Antibiotics  :    Anti-infectives (From admission, onward)   None      Inpatient Medications  Scheduled Meds:  aspirin  81 mg Oral Daily   clopidogrel  75 mg Oral Daily   escitalopram  10 mg Oral Daily   fentaNYL  1 patch Transdermal Q72H   gabapentin  600 mg Oral BID   heparin  5,000 Units Subcutaneous Q8H   insulin aspart  0-9 Units Subcutaneous TID WC   insulin aspart  4 Units Subcutaneous TID WC   insulin glargine  8 Units Subcutaneous BID   isosorbide mononitrate  30 mg Oral Daily   magnesium oxide  400 mg Oral Daily   [START ON 09/08/2018] metoprolol succinate  12.5 mg Oral Daily   pantoprazole  40 mg Oral BID   polyethylene glycol  17 g Oral Daily   sodium chloride flush  10-40 mL Intracatheter Q12H   Continuous Infusions:  PRN Meds:.acetaminophen, fluticasone, HYDROmorphone, nitroGLYCERIN, ondansetron, promethazine, sodium chloride flush   Time Spent in minutes  15  See all Orders from today for further details   Oren Binet M.D on 09/07/2018 at 1:34 PM  To  page  go to [email or web address] - use universal password  [facility] -  Office  [phone]    Objective:   Vitals:   09/06/18 1628 09/06/18 1941 09/07/18 0422 09/07/18 0809  BP: (!) 120/43 (!) 118/50 (!) 158/62 (!) 144/49  Pulse:  70 60 60  Resp:  17 17   Temp: 98 F (36.7 C) 98.2 F (36.8 C) 97.8 F (36.6 C) 98.4 F (36.9 C)  TempSrc: Oral  Oral Axillary  SpO2:  97% 100% 100%  Weight:      Height:        Wt Readings from Last 3 Encounters:  08/28/18 70.3 kg  08/23/18 68 kg  01/11/18 72.6 kg     Intake/Output Summary (Last 24 hours) at 09/07/2018 1334 Last data filed at 09/07/2018 0930 Gross per 24 hour  Intake 240 ml  Output --  Net 240 ml     Physical Exam Gen Exam:Alert awake-not in any distress.  Appears frail and chronically sick appearing HEENT:atraumatic, normocephalic Chest: B/L clear to auscultation anteriorly CVS:S1S2 regular Abdomen:soft non tender, non distended Extremities:no edema Neurology: Non focal Skin: no rash   Data Review:    CBC Recent Labs  Lab 09/01/18 0445 09/02/18 0400 09/03/18 0600  WBC 8.4 6.9 9.2  HGB 9.7* 10.4* 9.5*  HCT 30.8* 33.3* 30.2*  PLT 319 332 332  MCV 78.0* 77.4* 77.4*  MCH 24.6* 24.2* 24.4*  MCHC 31.5 31.2 31.5  RDW 14.8 14.9 14.7    Chemistries  Recent Labs  Lab 09/01/18 0445 09/02/18 0400 09/03/18 0600 09/04/18 0516 09/05/18 0440  NA 132* 131* 131*  --   --   K 4.0 4.5 4.4  --   --   CL 100 97* 99  --   --   CO2 23 22 26   --   --   GLUCOSE 75 203* 314*  --   --   BUN 17 28* 37*  --   --   CREATININE 0.99 0.98 1.14*  --   --   CALCIUM 9.4 10.2 9.7  --   --   MG  --   --  2.0 2.0 2.0  AST 33 42* 25  --   --   ALT 14 22 20   --   --   ALKPHOS 47 53 47  --   --   BILITOT 0.4 0.8 0.4  --   --    ------------------------------------------------------------------------------------------------------------------ No results for input(s): CHOL, HDL, LDLCALC, TRIG, CHOLHDL, LDLDIRECT in the last  72 hours.  Lab Results  Component Value Date   HGBA1C 7.9 (H) 08/27/2018   ------------------------------------------------------------------------------------------------------------------ Recent Labs    09/06/18 0425  TSH 1.137   ------------------------------------------------------------------------------------------------------------------ Recent Labs    09/05/18 0440  FERRITIN 130    Coagulation profile No results for input(s): INR, PROTIME in the last 168 hours.  Recent Labs    09/05/18 0440  DDIMER 1.65*    Cardiac Enzymes No results for input(s): CKMB, TROPONINI, MYOGLOBIN in the last 168 hours.  Invalid input(s): CK ------------------------------------------------------------------------------------------------------------------    Component Value Date/Time   BNP 438.8 (H) 01/13/2016 2993    Micro Results Recent Results (from the past 240 hour(s))  Novel Coronavirus, NAA (hospital order; send-out to ref lab)     Status: Abnormal   Collection Time: 09/03/18  4:22 PM   Specimen: Nasopharyngeal Swab; Respiratory  Result Value Ref Range Status   SARS-CoV-2, NAA DETECTED (A) NOT DETECTED Final    Comment: RESULT CALLED TO, READ BACK BY AND  VERIFIED WITH: J.BOYER AT 0300 ON 09/05/18 BY N.THOMPSON (NOTE)                  Client Requested Flag This test was developed and its performance characteristics determined by [employer]. This test has not been FDA cleared or approved. This test has been authorized by FDA under an Emergency Use Authorization (EUA). This test is only authorized for the duration of time the declaration that circumstances exist justifying the authorization of the emergency use of in vitro diagnostic tests for detection of SARS-CoV-2 virus and/or diagnosis of COVID-19 infection under section 564(b)(1) of the Act, 21 U.S.C. 923RAQ-7(M)(2), unless the authorization is terminated or revoked sooner. When diagnostic testing is  negative, the possibility of a false negative result should be considered in the context of a patient's recent exposures and the presence of clinical signs and symptoms consistent with COVID-19. An individual without symptoms of  COVID-19 and who is not shedding SARS-CoV-2 virus would expect to have a negative (not detected) result in this assay. Performed At: [facility] [place], [place] [id number] Rush Farmer MD YB:[id number]    [place]  Final    Comment: Performed at [facility] [address] [place], [place] [address]    Radiology Reports Ct Head Wo Contrast  Result Date: 08/26/2018 CLINICAL DATA:  Altered mental status. Nausea.  COVID-19 positive EXAM: CT HEAD WITHOUT CONTRAST TECHNIQUE: Contiguous axial images were obtained from the base of the skull through the vertex without intravenous contrast. COMPARISON:  Head CT 10/06/2016 FINDINGS: Brain: Patient had difficulty tolerating the exam, mild motion artifact. No intracranial hemorrhage, mass effect, or midline shift. No hydrocephalus. The basilar cisterns are patent. No evidence of territorial infarct or acute ischemia. No extra-axial or intracranial fluid collection. Unchanged degree of generalized atrophy. Mild chronic small vessel ischemia. Vascular: Atherosclerosis of skullbase vasculature without hyperdense vessel or abnormal calcification. Skull: No fracture or focal lesion. Sinuses/Orbits: Mucosal thickening of ethmoid air cells. No fluid levels. Mastoid air cells are clear. Bilateral cataract resection. Other: None. IMPRESSION: 1. No acute intracranial abnormality. 2. Unchanged atrophy and chronic small vessel ischemia. Electronically Signed   By: Keith Rake M.D.   On: 08/26/2018 23:39   Dg Chest Portable 1 View  Result Date: 08/26/2018 CLINICAL DATA:  Nausea. Poor p.o. intake. Vomiting. Diagnosed with COVID-19 5 days ago. EXAM: PORTABLE CHEST 1  VIEW COMPARISON:  08/23/2018. FINDINGS: Normal sized heart. Mildly tortuous and calcified thoracic aorta. Stable mild diffuse peribronchial thickening and accentuation of the interstitial markings. Interval minimal patchy opacity in the lateral aspect of the right mid upper lung zone. Mild linear densities in both lungs with mild progression. Stable post CABG changes, lower thoracic spine degenerative changes and diffuse osteopenia. A stat after a IMPRESSION: 1. Interval minimal patchy opacity in right mid upper lung zone laterally. This may represent early lung changes due to the patient's COVID-19. 2. Mildly progressive linear atelectasis in both lungs. 3. Stable mild chronic bronchitic changes. Electronically Signed   By: Claudie Revering M.D.   On: 08/26/2018 21:49   Dg Chest Portable 1 View  Result Date: 08/23/2018 CLINICAL DATA:  Nausea.  COVID-19 positive. EXAM: PORTABLE CHEST 1 VIEW COMPARISON:  January 11, 2018 FINDINGS: The heart size and mediastinal contours are stable. Both lungs are clear. The visualized skeletal structures are stable. IMPRESSION: No acute cardiopulmonary disease identified. Electronically Signed   By: Abelardo Diesel M.D.   On: 08/23/2018 08:39  Dg Abd 2 Views  Result Date: 08/26/2018 CLINICAL DATA:  Nausea. Poor oral intake. Diagnosed with COVID-19 5 days ago. Three episodes of vomiting today. EXAM: ABDOMEN - 2 VIEW COMPARISON:  10/04/2016. FINDINGS: Normal bowel gas pattern. No significant stool. Stable gastric band. Interval 4 mm left mid abdominal calcification. Stable thoracolumbar scoliosis and degenerative changes. Atheromatous arterial calcifications without visible aneurysm. Post CABG changes. Bilateral proximal thigh vascular stents. IMPRESSION: 1. No acute abnormality. 2. Interval 4 mm left mid abdominal calcification. This could represent a phlebolith or urinary tract calcification. Electronically Signed   By: Claudie Revering M.D.   On: 08/26/2018 21:45

## 2018-09-08 LAB — GLUCOSE, CAPILLARY: Glucose-Capillary: 95 mg/dL (ref 70–99)

## 2018-09-08 NOTE — Progress Notes (Signed)
Physical Therapy Treatment Patient Details Name: [name] MRN: [id number] DOB: 06/28/1940 Today's Date: 09/08/2018    History of Present Illness Pt is a 78 y.o. female with PMH significant for Anxiety, Arthritis, CAD, CHF, Chronic pain, HTN, MI (08/2015), Multiple myeloma (dx'd 2015), PAD, Stroke, and DM2; positive SARS-CoV-2 on 08/17/2018,experiencing severe nausea without vomiting, decreased activity tolerance, fatigue.    PT Comments    The patient is progressing well. Ambulated x 210' with rollator. Now to Dc home.   Follow Up Recommendations  Home health PT(son OK w/ Dc home as pt can amb)     Equipment Recommendations  None recommended by PT    Recommendations for Other Services       Precautions / Restrictions Precautions Precautions: None    Mobility  Bed Mobility Overal bed mobility: Modified Independent                Transfers Overall transfer level: Needs assistance Equipment used: 4-wheeled walker Transfers: Sit to/from Stand Sit to Stand: Min guard         General transfer comment: steady assist from bed  Ambulation/Gait Ambulation/Gait assistance: Min guard Gait Distance (Feet): 210 Feet Assistive device: 4-wheeled walker Gait Pattern/deviations: Step-through pattern;Decreased stride length;Trunk flexed         Stairs             Wheelchair Mobility    Modified Rankin (Stroke Patients Only)       Balance                                            Cognition Arousal/Alertness: Awake/alert Behavior During Therapy: Flat affect                       Current Attention Level: Sustained   Following Commands: Follows one step commands consistently              Exercises      General Comments        Pertinent Vitals/Pain Pain Assessment: No/denies pain    Home Living                      Prior Function            PT Goals (current goals can now be found in the  care plan section) Progress towards PT goals: Progressing toward goals    Frequency    Min 3X/week      PT Plan Current plan remains appropriate;Discharge plan needs to be updated    Co-evaluation              AM-PAC PT "6 Clicks" Mobility   Outcome Measure  Help needed turning from your back to your side while in a flat bed without using bedrails?: None Help needed moving from lying on your back to sitting on the side of a flat bed without using bedrails?: None Help needed moving to and from a bed to a chair (including a wheelchair)?: A Little Help needed standing up from a chair using your arms (e.g., wheelchair or bedside chair)?: A Little Help needed to walk in hospital room?: A Little Help needed climbing 3-5 steps with a railing? : A Little 6 Click Score: 20    End of Session   Activity Tolerance: Patient tolerated treatment well Patient left: with call bell/phone within  reach;in bed Nurse Communication: Mobility status PT Visit Diagnosis: Muscle weakness (generalized) (M62.81);Difficulty in walking, not elsewhere classified (R26.2);Pain     Time: 1000-1023 PT Time Calculation (min) (ACUTE ONLY): 23 min  Charges:  $Gait Training: 23-37 mins                     [name] PT Acute Rehabilitation Services Pager [phone] Office [phone]    [name] 09/08/2018, 1:59 PM

## 2018-09-08 NOTE — TOC Benefit Eligibility Note (Deleted)
Transition of Care [facility]) Benefit Eligibility Note    Patient Details  Name: [name] MRN: [id number] Date of Birth: [birth year]                                Ninfa Meeker, RN Phone Number: 09/08/2018, 11:24 AM

## 2018-09-08 NOTE — TOC Transition Note (Signed)
Transition of Care [facility]) - CM/SW Discharge Note   Patient Details  Name: [name] MRN: [id number] Date of Birth: 1940/10/28  Transition of Care [facility]) CM/SW Contact:  Ninfa Meeker, RN Phone Number: [phone] (working remotely) 09/08/2018, 11:18 AM   Clinical Narrative:   Patient's son notified nursing staff that he would take his mother home. CM called Awanda Mink to confirm. Case Manager called patient to discuss choice for [facility] agency. Patient states she has used [facility] in the past and wants to use them now. Referral called to Joen Laura, [facility]. Case manager contacted Awanda Mink to let him know that home health is in place and his mom is ready for discharge. He will transport her home.    Final next level of care: Home w Home Health Services Barriers to Discharge: (has been son not agreeing to [facility] or SNF)   Patient Goals and CMS Choice Patient states their goals for this hospitalization and ongoing recovery are:: to get better CMS Medicare.gov Compare Post Acute Care list provided to:: Patient Represenative (must comment) Choice offered to / list presented to : Patient  Discharge Placement                       Discharge Plan and Services   Discharge Planning Services: CM Consult Post Acute Care Choice: Home Health          DME Arranged: N/A         HH Arranged: PT, OT, Nurse's Aide [place] Agency: [place]   Time [facility] Agency Contacted: [phone] Representative spoke with at [place]: [place] (SDOH) Interventions     Readmission Risk Interventions No flowsheet data found.

## 2018-11-07 ENCOUNTER — Telehealth: Payer: Self-pay | Admitting: *Deleted

## 2018-11-07 DIAGNOSIS — Z006 Encounter for examination for normal comparison and control in clinical research program: Secondary | ICD-10-CM

## 2018-11-07 NOTE — Telephone Encounter (Signed)
GOULD EDUInformed Consent  Subject Name:[name]  Subject met inclusion and exclusion criteria. The informed consent form, study requirements and expectations were reviewed with the subject and questions and concerns were addressed prior to the signing of the consent form. The subject verbalized understanding of the trial requirements. The subject agreed to participate in the [place] and gave verbal consent to participate in the [place] 11/03/2018. The informed consent was obtained prior to performance of any protocol-specific procedures for the subject. A copy of the signed informed consent was mailedto the subject and a copy was placed in the subject's medical record.  Oletta Cohn.

## 2018-11-29 ENCOUNTER — Other Ambulatory Visit: Payer: Self-pay

## 2018-11-29 ENCOUNTER — Encounter: Payer: Medicare Other | Attending: Internal Medicine | Admitting: Internal Medicine

## 2018-11-29 DIAGNOSIS — Z8579 Personal history of other malignant neoplasms of lymphoid, hematopoietic and related tissues: Secondary | ICD-10-CM | POA: Insufficient documentation

## 2018-11-29 DIAGNOSIS — Z923 Personal history of irradiation: Secondary | ICD-10-CM | POA: Insufficient documentation

## 2018-11-29 DIAGNOSIS — E11621 Type 2 diabetes mellitus with foot ulcer: Secondary | ICD-10-CM | POA: Diagnosis present

## 2018-11-29 DIAGNOSIS — E1142 Type 2 diabetes mellitus with diabetic polyneuropathy: Secondary | ICD-10-CM | POA: Diagnosis not present

## 2018-11-29 DIAGNOSIS — I110 Hypertensive heart disease with heart failure: Secondary | ICD-10-CM | POA: Insufficient documentation

## 2018-11-29 DIAGNOSIS — I509 Heart failure, unspecified: Secondary | ICD-10-CM | POA: Diagnosis not present

## 2018-11-29 DIAGNOSIS — Z7984 Long term (current) use of oral hypoglycemic drugs: Secondary | ICD-10-CM | POA: Diagnosis not present

## 2018-11-29 DIAGNOSIS — Z9221 Personal history of antineoplastic chemotherapy: Secondary | ICD-10-CM | POA: Diagnosis not present

## 2018-11-29 DIAGNOSIS — I2510 Atherosclerotic heart disease of native coronary artery without angina pectoris: Secondary | ICD-10-CM | POA: Diagnosis not present

## 2018-11-29 DIAGNOSIS — L97421 Non-pressure chronic ulcer of left heel and midfoot limited to breakdown of skin: Secondary | ICD-10-CM | POA: Insufficient documentation

## 2018-11-29 NOTE — Progress Notes (Signed)
[name], [name] ([id number]) Visit Report for 11/29/2018 Abuse/Suicide Risk Screen Details Patient Name: [name], [name] Date of Service: 11/29/2018 9:45 AM Medical Record Number: [id number] Patient Account Number: [id number] Date of Birth/Sex: May 28, 1940 (78 y.o. F) Treating RN: Army Melia Primary Care [place] Nordquist: Lavera [name] Other Clinician: Referring Mayer Vondrak: Referral, Self Treating Telisha Zawadzki/Extender: Tito Dine in Treatment: 0 Abuse/Suicide Risk Screen Items Answer ABUSE RISK SCREEN: Has anyone close to you tried to hurt or harm you recentlyo No Do you feel uncomfortable with anyone in your familyo No Has anyone forced you do things that you didnot want to doo No Electronic Signature(s) Signed: 11/29/2018 4:09:40 PM By: Army Melia Entered By: Army Melia on 11/29/2018 10:05:12 Degracia, [place]. ([id number]) -------------------------------------------------------------------------------- Activities of Daily Living Details Patient Name: [name], [name] Date of Service: 11/29/2018 9:45 AM Medical Record Number: [id number] Patient Account Number: [id number] Date of Birth/Sex: 1940/05/06 (78 y.o. F) Treating RN: Army Melia Primary Care Peityn Payton: Lavera [name] Other Clinician: Referring Rogenia Werntz: Referral, Self Treating Larinda Herter/Extender: Tito Dine in Treatment: 0 Activities of Daily Living Items Answer Activities of Daily Living (Please select one for each item) Drive Automobile Completely Able Take Medications Completely Able Use Telephone Completely Able Care for Appearance Completely Able Use Toilet Completely Able Bath / Shower Completely Able Dress Self Completely Able Feed Self Completely Able Walk Completely Able Get In / Out Bed Completely Able Housework Completely Able Prepare Meals Completely Able Handle Money Completely Able Shop for Self Completely Able Electronic Signature(s) Signed: 11/29/2018 4:09:40 PM By: Army Melia Entered  By: Army Melia on 11/29/2018 10:05:22 Mathias, Faria AMarland Kitchen ([id number]) -------------------------------------------------------------------------------- Education Screening Details Patient Name: [name], [name] Date of Service: 11/29/2018 9:45 AM Medical Record Number: [id number] Patient Account Number: [id number] Date of Birth/Sex: [birth year] (78 y.o. F) Treating RN: Army Melia Primary Care Tessie Ordaz: Lavera [name] Other Clinician: Referring Vandora Jaskulski: Referral, Self Treating Youlanda Tomassetti/Extender: Tito Dine in Treatment: 0 Learning Preferences/Education Level/Primary Language Learning Preference: Explanation, Demonstration Highest Education Level: College or Above Preferred Language: English Cognitive Barrier Language Barrier: No Translator Needed: No Memory Deficit: No Emotional Barrier: No Cultural/Religious Beliefs Affecting Medical Care: No Physical Barrier Impaired Vision: No Impaired Hearing: No Decreased Hand dexterity: No Knowledge/Comprehension Knowledge Level: High Comprehension Level: High Ability to understand written High instructions: Ability to understand verbal High instructions: Motivation Anxiety Level: Calm Cooperation: Cooperative Education Importance: Acknowledges Need Interest in Health Problems: Asks Questions Perception: Coherent Willingness to Engage in Self- High Management Activities: Readiness to Engage in Self- High Management Activities: Electronic Signature(s) Signed: 11/29/2018 4:09:40 PM By: Army Melia Entered By: Army Melia on 11/29/2018 10:05:42 Casimir, [place]. ([id number]) -------------------------------------------------------------------------------- Fall Risk Assessment Details Patient Name: [name], [name] Date of Service: 11/29/2018 9:45 AM Medical Record Number: [id number] Patient Account Number: [id number] Date of Birth/Sex: October 08, 1940 (78 y.o. F) Treating RN: Army Melia Primary Care Kiley Solimine: Lavera [name] Other  Clinician: Referring Joby Richart: Referral, Self Treating Adryel Wortmann/Extender: Tito Dine in Treatment: 0 Fall Risk Assessment Items Have you had 2 or more falls in the last 12 monthso 0 No Have you had any fall that resulted in injury in the last 12 monthso 0 No FALLS RISK SCREEN History of falling - immediate or within 3 months 0 No Secondary diagnosis (Do you have 2 or more medical diagnoseso) 0 No Ambulatory aid None/bed rest/wheelchair/nurse 0 No Crutches/cane/walker 15 Yes Furniture 0 No Intravenous therapy Access/Saline/Heparin Lock 0 No Gait/Transferring Normal/ bed rest/ wheelchair 0 No Weak (short steps with  or without shuffle, stooped but able to lift head while 10 Yes walking, may seek support from furniture) Impaired (short steps with shuffle, may have difficulty arising from chair, head 0 No down, impaired balance) Mental Status Oriented to own ability 0 No Electronic Signature(s) Signed: 11/29/2018 4:09:40 PM By: Army Melia Entered By: Army Melia on 11/29/2018 10:05:50 [name], [name] ([id number]) -------------------------------------------------------------------------------- Foot Assessment Details Patient Name: [name], [name] Date of Service: 11/29/2018 9:45 AM Medical Record Number: [id number] Patient Account Number: [id number] Date of Birth/Sex: June 11, 1940 (78 y.o. F) Treating RN: Army Melia Primary Care Katai Marsico: Lavera [name] Other Clinician: Referring Girolamo Lortie: Referral, Self Treating Tailey Top/Extender: Tito Dine in Treatment: 0 Foot Assessment Items Site Locations + = Sensation present, - = Sensation absent, C = Callus, U = Ulcer R = Redness, W = Warmth, M = Maceration, PU = Pre-ulcerative lesion F = Fissure, S = Swelling, D = Dryness Assessment Right: Left: Other Deformity: No No Prior Foot Ulcer: No No Prior Amputation: No No Charcot Joint: No No Ambulatory Status: Ambulatory With Help Assistance Device:  Walker Gait: Steady Electronic Signature(s) Signed: 11/29/2018 4:09:40 PM By: Army Melia Entered By: Army Melia on 11/29/2018 10:07:03 [name], [name] ([id number]) -------------------------------------------------------------------------------- Nutrition Risk Screening Details Patient Name: Burkhead, Stacey A. Date of Service: 11/29/2018 9:45 AM Medical Record Number: [id number] Patient Account Number: [id number] Date of Birth/Sex: 11/07/1940 (78 y.o. F) Treating RN: Army Melia Primary Care Aiyla Baucom: Lavera [name] Other Clinician: Referring Lorilei Horan: Referral, Self Treating Alvenia Treese/Extender: Tito Dine in Treatment: 0 Height (in): 62 Weight (lbs): 155 Body Mass Index (BMI): 28.3 Nutrition Risk Screening Items Score Screening NUTRITION RISK SCREEN: I have an illness or condition that made me change the kind and/or amount of 0 No food I eat I eat fewer than two meals per day 0 No I eat few fruits and vegetables, or milk products 0 No I have three or more drinks of beer, liquor or wine almost every day 0 No I have tooth or mouth problems that make it hard for me to eat 0 No I don't always have enough money to buy the food I need 0 No I eat alone most of the time 0 No I take three or more different prescribed or over-the-counter drugs a day 0 No Without wanting to, I have lost or gained 10 pounds in the last six months 0 No I am not always physically able to shop, cook and/or feed myself 0 No Nutrition Protocols Good Risk Protocol 0 No interventions needed Moderate Risk Protocol High Risk Proctocol Risk Level: Good Risk Score: 0 Electronic Signature(s) Signed: 11/29/2018 4:09:40 PM By: Army Melia Entered By: Army Melia on 11/29/2018 10:05:57

## 2018-11-30 NOTE — Progress Notes (Signed)
DOREENA, MAULDEN ([id number]) Visit Report for 11/29/2018 Chief Complaint Document Details Patient Name: Slone, Chantilly A. Date of Service: 11/29/2018 9:45 AM Medical Record Number: [id number] Patient Account Number: [id number] Date of Birth/Sex: 1940-05-13 (78 y.o. F) Treating RN: Cornell Barman Primary Care Provider: Lavera [name] Other Clinician: Referring Provider: Referral, Self Treating Provider/Extender: Tito Dine in Treatment: 0 Information Obtained from: Patient Chief Complaint She is here in follow up evaluation for a left heel ulcer 03/01/2018; patient is here for review of bilateral painful heel issues 11/29/2018; patient is here for concerns about her bilateral heels Electronic Signature(s) Signed: 11/29/2018 5:09:09 PM By: [name] MD Entered By: [name] on 11/29/2018 11:06:27 Haraway, [place]. ([id number]) -------------------------------------------------------------------------------- HPI Details Patient Name: Sterry, Madlynn A. Date of Service: 11/29/2018 9:45 AM Medical Record Number: [id number] Patient Account Number: [id number] Date of Birth/Sex: [birth year] (78 y.o. F) Treating RN: Cornell Barman Primary Care Provider: Lavera [name] Other Clinician: Referring Provider: Referral, Self Treating Provider/Extender: Tito Dine in Treatment: 0 History of Present Illness HPI Description: 12/24/15; Mrs. Aldaz is a 78 year old woman who is the mother of one of our other clinic patient's Tammy Hashem. He comes [place] today for review a nonhealing surgical CABG wound. The patient underwent CABG o2 on October 31 17 at [facility]. She apparently had had a myocardial infarction in October but briefly presented with shortness of breath. She underwent surgery which I understand was uneventful. According to the patient her surgical incision never really healed in 2 spots. One is at the inferior recess question drain site, the other more superiorly about an  inch from the superior surface of this it incision. She has been followed by her surgeon at [facility] who felt that both areas which he'll according to the patient. She was put on Augmentin 2 weeks ago and she is finished a course of this culture at that time showed Escherichia coli which should've been sensitive. The patient also relates that she has a history of allergy to ampicillin. Besides this the patient has a history of type 2 diabetes on metformin, multiple myeloma. She had a chest x-ray in mid October the did not show any abnormalities. I cannot see that she had any specific views of the sternum however. We have her referral note from her primary physician Dr. Bernie Covey. She was concerned about erythema around the wound and as mentioned the culture here showed Escherichia coli which should have been Augmentin sensitive but was resistant to ampicillin, Keflex third generation cephalosporins quinolones and sulfonamides. [ESBL). The patient does not complain of fever or chills excessive pain. She does say that the lower surgical wound has been draining what looks to her like pus. She has been using triple antibiotic cream. 12/31/15 superior wound actually looks somewhat better. Inferiorly still a wound with some depth greater than 2 cm. Using Aquacel Ag. Culture I did of the drainage from the inferior wound and this surgical incision last week was negative 01/07/16; superior wound still requires debridement of surface eschar and nonviable subcutaneous tissue. Inferior wound measured at 1.8 cm. What I can see of the tissue here does not look very healthy. Original culture that I did on her presentation was negative however 01/27/16; the superior wound is closed she still has a deep probing wound of roughly 2 cm inferiorly. There is no overt infection no surrounding erythema. The patient tells me that 2 weeks ago she was developing chest pain and underwent a nuclear stress test. This  was  apparently negative however she developed chest pain shortly thereafter and had to go have a cardiac catheterization that did not show blockages. Since then she is not having chest pain but has unrelenting nausea making it difficult for her to eat. We reviewed her medications and she is on aspirin and Plavix however she apparently has a stent in place as well. 02/03/16; still using silver alginate. Depth of this is down from 1.8 cm the circumference is smaller and the depth down by 2 mm red no drainage and no surrounding infection 02/10/16; still using silver alginate depth down to 1.3 cm today measured by myself. Appears to be making slow progress. There is no surrounding tenderness.. The patient states she has unrelenting nausea which is nonexertional. Not related to meals. She has had no vomiting. She also tells me she is had previous lap band surgery but does not give clear reflux type symptoms 02/25/16; the wound orifice of this lady's wound on her chest is down to 1 cm in depth and much smaller in terms of the overall diameter. She tells me that she was packing the wound recently and developed quite a bit of bleeding that took several hours to stop she is on Plavix and aspirin. She is not having any exertional symptoms that sound like coronary artery disease. She is complaining of unrelenting nausea but she is being evaluated by GI for this and apparently has an endoscopy planned for March. We have been using iodoform packing 03/03/16; small wound in terms of orifice but measures 1.2 cm in depth, not much different from last time. She still complains of nausea and has an upper GI series books she is not describing exertional chest pain, shortness of breath, nausea or fatigue. 03/16/16; I follow this patient for a nonhealing surgical wound at a site of a previous CABG scar. She developed cellulitis in the incision postoperatively. Today she arrives with the wound orifice to miniscule to even  consider measuring a depth. She came in with a Band-Aid over this, she could not get any of the iodoform packing in the increasingly small orifice 03/31/16; the patient's nonhealing surgical wound at the site of her previous CABG scar/inferior aspect is totally epithelialized. She tells me there was some bleeding out of this which was minimal last week however with careful inspection there is no evidence of any open here everything looks healthy and epithelialize. Surrounding tissue also looks normal Beaudry, Nkenge A. ([id number]) the patient arrives today with erythema and a significant blister mostly involving the dorsal aspect of her right fifth finger. There were 2 small almost looked like puncture wounds on the anterior aspect of the DIP joint and the medial aspect of the finger. The area was tender and warm. There was no involvement of any joint 04/20/16; The patient returns today for one last followup. Her original chest wound has remained closed which is fortunate, She came in last time with a right 5th finger blister and erythema. she did not tolerate the aquacel AG. C+S I did was negative. since I last saw her she has a skin tear on the right lateral elbow while doing a medical test READMISSION 03/23/17;Mrs. Stamas is a now 78 year old woman that we cared for in this clinic about a year ago with nonhealing wounds at the site of her CABG. She has a type II diabetic with diabetic neuropathy. At the point of this dictation I don't have a hemoglobin A1c however she is only on oral agents. She  tells me that she noted a blister on her left lateral heel about 3 weeks ago. She brought this to attention of her primary doctor and was given DuoDERM. Later on her daughter actually removed the surface of this and some surrounding skin. She is in for our review of this. She does not have a history of wounds on her lower extremities. She does not have known PAD. She does have neuropathy in her feet ABIs in  this clinic were noncompressible bilaterally 03/30/17-she is here in follow up evaluation for a left heel wound. She is voicing no complaints or concerns. She states she could not wear the surgical shoe. She continues to have maceration although not significantly different. We will switch to [facility] and she'll follow-up next week 04/06/17; patient is here for follow-up evaluation of the left heel wound. She has been using Prisma. The area is closed this week. READMISSION 03/01/2018. Patient is back in clinic today. She has type 2 diabetes with diabetic neuropathy. She states over the last week and a half she has noted painful areas on her bilateral outer heels. She has been in this clinic before in March 2019 almost a year ago with areas on her left heel. This healed out very easily. ABIs in our clinic have been consistently noncompressible although she is not known to have a significant arterial issue. She is gone to wearing her open heeled bedroom slippers so as not to put any pressure on the heel although a week or so ago she was walking in shoes that may have contributed to the areas in question. 03/15/2018; 3-week follow-up. The right heel looks fine. The left heel deep tissue injury looks improved. Surface epithelium is intact. There is still evidence of subcutaneous damage but this is not evolved into a wound so far 3/25; 3-week follow-up. There is no open wound on either heel. She complains of pain in the right heel when she walks however surface epithelium is intact. Peripheral pulses are palpable READMISSION 11/29/2018 Mrs. Quimby is a patient we know fairly well in this clinic for a history of wound difficulties. 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 She was here again in 2019 with a wound on the left lateral heel and then again in February and March of this year with bilateral wounds on her lateral heels. She is a type II diabetic with peripheral neuropathy but  she is not felt to have PAD although her ABIs have been previously noncompressible. She tells me that she was hospitalized in August at [facility] in [place] for Covid she was in hospital for 3 weeks. She came out of this with apparently bilateral, lateral heel wounds. For the last 3 or 4 weeks she has had [facility] in the area on the right is actually healed however she has a small remaining open area on the left. She is not been using shoes with heels in them. We had previously recommended that she get bilateral "bunny boots" for use at night. I do not think she ever did this. Past medical history, type 2 diabetes with peripheral neuropathy, apparently a recent diagnosis of CLL, multiple myeloma, coronary artery disease, hypertension and Covid in August 2020 she is apparently still getting PT and OT. She feels generally very weak but otherwise well Electronic Signature(s) Signed: 11/29/2018 5:09:09 PM By: [name] MD Entered By: [name] on 11/29/2018 11:09:47 Kullman, Natalin A. ([id number]) Parcell, Fynlee A. ([id number]) -------------------------------------------------------------------------------- Physical Exam  Details Patient Name: Driscoll, Amoree A. Date of Service: 11/29/2018 9:45 AM Medical Record Number: [id number] Patient Account Number: [id number] Date of Birth/Sex: [birth year] (78 y.o. F) Treating RN: Cornell Barman Primary Care Provider: Lavera [name] Other Clinician: Referring Provider: Referral, Self Treating Provider/Extender: Tito Dine in Treatment: 0 Constitutional Sitting or standing Blood Pressure is within target range for patient.. Pulse regular and within target range for patient.Marland Kitchen Respirations regular, non-labored and within target range.. Temperature is normal and within the target range for the patient.Marland Kitchen appears in no distress. Eyes Conjunctivae clear. No discharge. Respiratory Respiratory effort is easy and symmetric bilaterally.  Rate is normal at rest and on room air.. Cardiovascular Femoral pulses are palpable. Dorsalis pedis pulses are palpable posterior tibial pulses are palpable. Lymphatic None palpable in the popliteal or inguinal area. Integumentary (Hair, Skin) No erythema around the wound. Neurological Reduced sensation in the bilateral feet. Psychiatric No evidence of depression, anxiety, or agitation. Calm, cooperative, and communicative. Appropriate interactions and affect.. Notes Wound examination; there is nothing open on the patient's right heel although as usual she still complains of right lateral heel tenderness. She has a very superficial wound on the left lateral heel. Electronic Signature(s) Signed: 11/29/2018 5:09:09 PM By: [name] MD Entered By: [name] on 11/29/2018 11:14:14 Zulauf, [name] ([id number]) -------------------------------------------------------------------------------- Physician Orders Details Patient Name: Grimm, Murline A. Date of Service: 11/29/2018 9:45 AM Medical Record Number: [id number] Patient Account Number: [id number] Date of Birth/Sex: 1940-07-21 (78 y.o. F) Treating RN: Cornell Barman Primary Care Provider: Lavera [name] Other Clinician: Referring Provider: Referral, Self Treating Provider/Extender: Tito Dine in Treatment: 0 Verbal / Phone Orders: No Diagnosis Coding Wound Cleansing Wound #5 Left,Lateral Calcaneus o Dial antibacterial soap, wash wounds, rinse and pat dry prior to dressing wounds Anesthetic (add to Medication List) Wound #5 Left,Lateral Calcaneus o Topical Lidocaine 4% cream applied to wound bed prior to debridement (In Clinic Only). Primary Wound Dressing Wound #5 Left,Lateral Calcaneus o Silver Collagen - moistened with hydrogel Secondary Dressing Wound #5 Left,Lateral Calcaneus o Boardered Foam Dressing Dressing Change Frequency Wound #5 Left,Lateral Calcaneus o Change Dressing Monday, Wednesday,  Friday Follow-up Appointments Wound #5 Left,Lateral Calcaneus o Return Appointment in 2 weeks. Edema Control Wound #5 Left,Lateral Calcaneus o Elevate legs to the level of the heart and pump ankles as often as possible Off-Loading Wound #5 Left,Lateral Calcaneus o Other: - Keep pressure off of heels Electronic Signature(s) Signed: 11/29/2018 5:09:09 PM By: [name] MD Signed: 11/30/2018 1:15:06 PM By: Gretta Cool, BSN, RN, CWS, Kim RN, BSN Entered By: Gretta Cool, BSN, RN, CWS, Kim on 11/29/2018 10:26:12 Heeney, Yaritsa AMarland Kitchen ([id number]) -------------------------------------------------------------------------------- Problem List Details Patient Name: Saltos, Varie A. Date of Service: 11/29/2018 9:45 AM Medical Record Number: [id number] Patient Account Number: [id number] Date of Birth/Sex: 1940/12/25 (78 y.o. 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 ([id number]) -------------------------------------------------------------------------------- Progress Note Details Patient Name: Fusilier, Bria A. Date of Service: 11/29/2018 9:45 AM Medical Record Number: [id number]  Patient Account Number: [id number] Date of Birth/Sex: 1940-09-03 (78 y.o. F) Treating RN: Cornell Barman Primary Care Provider: Lavera [name] Other Clinician: Referring Provider:  Referral, Self Treating Provider/Extender: Tito Dine in Treatment: 0 Subjective Chief Complaint Information obtained from Patient She is here in follow up evaluation for a left heel ulcer 03/01/2018; patient is here for review of bilateral painful heel issues 11/29/2018; patient is here for concerns about her bilateral heels History of Present Illness (HPI) 12/24/15; Mrs. Rolin is a 78 year old woman who is the mother of one of our other clinic patient's Tammy Agresta. He comes [place] today for review a nonhealing surgical CABG wound. The patient underwent CABG o2 on October 31 17 at [facility]. She apparently had had a myocardial infarction in October but briefly presented with shortness of breath. She underwent surgery which I understand was uneventful. According to the patient her surgical incision never really healed in 2 spots. One is at the inferior recess question drain site, the other more superiorly about an inch from the superior surface of this it incision. She has been followed by her surgeon at [facility] who felt that both areas which he'll according to the patient. She was put on Augmentin 2 weeks ago and she is finished a course of this culture at that time showed Escherichia coli which should've been sensitive. The patient also relates that she has a history of allergy to ampicillin. Besides this the patient has a history of type 2 diabetes on metformin, multiple myeloma. She had a chest x-ray in mid October the did not show any abnormalities. I cannot see that she had any specific views of the sternum however. We have her referral note from her primary physician Dr. Bernie Covey. She was concerned about erythema around the wound and as mentioned the culture here showed Escherichia coli which should have been Augmentin sensitive but was resistant to ampicillin, Keflex third generation cephalosporins quinolones and sulfonamides. [ESBL). The patient does  not complain of fever or chills excessive pain. She does say that the lower surgical wound has been draining what looks to her like pus. She has been using triple antibiotic cream. 12/31/15 superior wound actually looks somewhat better. Inferiorly still a wound with some depth greater than 2 cm. Using Aquacel Ag. Culture I did of the drainage from the inferior wound and this surgical incision last week was negative 01/07/16; superior wound still requires debridement of surface eschar and nonviable subcutaneous tissue. Inferior wound measured at 1.8 cm. What I can see of the tissue here does not look very healthy. Original culture that I did on her presentation was negative however 01/27/16; the superior wound is closed she still has a deep probing wound of roughly 2 cm inferiorly. There is no overt infection no surrounding erythema. The patient tells me that 2 weeks ago she was developing chest pain and underwent a nuclear stress test. This was apparently negative however she developed chest pain shortly thereafter and had to go have a cardiac catheterization that did not show blockages. Since then she is not having chest pain but has unrelenting nausea making it difficult for her to eat. We reviewed her medications and she is on aspirin and Plavix however she apparently has a stent in place as well. 02/03/16; still using silver alginate. Depth of this is down from 1.8 cm the circumference is smaller and the depth down by 2 mm red no drainage and no surrounding infection 02/10/16; still using silver alginate depth  down to 1.3 cm today measured by myself. Appears to be making slow progress. There is no surrounding tenderness.. The patient states she has unrelenting nausea which is nonexertional. Not related to meals. She has had no vomiting. She also tells me she is had previous lap band surgery but does not give clear reflux type symptoms 02/25/16; the wound orifice of this lady's wound on her chest  is down to 1 cm in depth and much smaller in terms of the overall diameter. She tells me that she was packing the wound recently and developed quite a bit of bleeding that took several hours to stop she is on Plavix and aspirin. She is not having any exertional symptoms that sound like coronary artery disease. She is complaining of unrelenting nausea but she is being evaluated by GI for this and apparently has an endoscopy planned for March. We have been using iodoform packing 03/03/16; small wound in terms of orifice but measures 1.2 cm in depth, not much different from last time. She still complains of Bui, Bambie A. ([id number]) nausea and has an upper GI series books she is not describing exertional chest pain, shortness of breath, nausea or fatigue. 03/16/16; I follow this patient for a nonhealing surgical wound at a site of a previous CABG scar. She developed cellulitis in the incision postoperatively. Today she arrives with the wound orifice to miniscule to even consider measuring a depth. She came in with a Band-Aid over this, she could not get any of the iodoform packing in the increasingly small orifice 03/31/16; the patient's nonhealing surgical wound at the site of her previous CABG scar/inferior aspect is totally epithelialized. She tells me there was some bleeding out of this which was minimal last week however with careful inspection there is no evidence of any open here everything looks healthy and epithelialize. Surrounding tissue also looks normal the patient arrives today with erythema and a significant blister mostly involving the dorsal aspect of her right fifth finger. There were 2 small almost looked like puncture wounds on the anterior aspect of the DIP joint and the medial aspect of the finger. The area was tender and warm. There was no involvement of any joint 04/20/16; The patient returns today for one last followup. Her original chest wound has remained closed which is  fortunate, She came in last time with a right 5th finger blister and erythema. she did not tolerate the aquacel AG. C+S I did was negative. since I last saw her she has a skin tear on the right lateral elbow while doing a medical test READMISSION 03/23/17;Mrs. Alto is a now 78 year old woman that we cared for in this clinic about a year ago with nonhealing wounds at the site of her CABG. She has a type II diabetic with diabetic neuropathy. At the point of this dictation I don't have a hemoglobin A1c however she is only on oral agents. She tells me that she noted a blister on her left lateral heel about 3 weeks ago. She brought this to attention of her primary doctor and was given DuoDERM. Later on her daughter actually removed the surface of this and some surrounding skin. She is in for our review of this. She does not have a history of wounds on her lower extremities. She does not have known PAD. She does have neuropathy in her feet ABIs in this clinic were noncompressible bilaterally 03/30/17-she is here in follow up evaluation for a left heel wound. She is voicing no complaints  or concerns. She states she could not wear the surgical shoe. She continues to have maceration although not significantly different. We will switch to [facility] and she'll follow-up next week 04/06/17; patient is here for follow-up evaluation of the left heel wound. She has been using Prisma. The area is closed this week. READMISSION 03/01/2018. Patient is back in clinic today. She has type 2 diabetes with diabetic neuropathy. She states over the last week and a half she has noted painful areas on her bilateral outer heels. She has been in this clinic before in March 2019 almost a year ago with areas on her left heel. This healed out very easily. ABIs in our clinic have been consistently noncompressible although she is not known to have a significant arterial issue. She is gone to wearing her open heeled bedroom slippers so  as not to put any pressure on the heel although a week or so ago she was walking in shoes that may have contributed to the areas in question. 03/15/2018; 3-week follow-up. The right heel looks fine. The left heel deep tissue injury looks improved. Surface epithelium is intact. There is still evidence of subcutaneous damage but this is not evolved into a wound so far 3/25; 3-week follow-up. There is no open wound on either heel. She complains of pain in the right heel when she walks however surface epithelium is intact. Peripheral pulses are palpable READMISSION 11/29/2018 Mrs. Matsuo is a patient we know fairly well in this clinic for a history of wound difficulties. 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 She was here again in 2019 with a wound on the left lateral heel and then again in February and March of this year with bilateral wounds on her lateral heels. She is a type II diabetic with peripheral neuropathy but she is not felt to have PAD although her ABIs have been previously noncompressible. She tells me that she was hospitalized in August at [facility] in [place] for Covid she was in hospital for 3 weeks. She came out of this with apparently bilateral, lateral heel wounds. For the last 3 or 4 weeks she has had [facility] in the area on the right is actually healed however she has a small remaining open area on the left. She is not been using shoes with heels in them. We had previously recommended that she get bilateral "bunny boots" for use at night. I do not think she ever did this. Past medical history, type 2 diabetes with peripheral neuropathy, apparently a recent diagnosis of CLL, multiple myeloma, coronary artery disease, hypertension and Covid in August 2020 she is apparently still getting PT and OT. She feels generally very weak but otherwise well Kennerson, Laura A. ([id number]) Patient History Information obtained from  Patient. Allergies Aricept, Benzodiazepines, Opioids - Morphine Analogues, penicillin, Sulfa (Sulfonamide Antibiotics), Statins-Hmg-Coa Reductase Inhibitors Family History Cancer - Father, Diabetes - Mother, Heart Disease - Mother, Hypertension - Mother, No family history of Hereditary Spherocytosis, Kidney Disease, Lung Disease, Seizures, Stroke, Thyroid Problems, Tuberculosis. Social History Never smoker, Marital Status - Divorced, Alcohol Use - Never, Drug Use - No History, Caffeine Use - Moderate. Medical History Eyes Patient has history of Cataracts - removed Denies history of Glaucoma, Optic Neuritis Ear/Nose/Mouth/Throat Denies history of Chronic sinus problems/congestion, Middle ear problems Hematologic/Lymphatic Patient has history of Anemia Denies history of Hemophilia, Human Immunodeficiency Virus, Lymphedema, Sickle Cell Disease Respiratory Denies history of Aspiration, Asthma, Chronic Obstructive Pulmonary Disease (  COPD), Pneumothorax, Sleep Apnea, Tuberculosis Cardiovascular Patient has history of Congestive Heart Failure, Coronary Artery Disease, Hypertension Denies history of Angina, Arrhythmia, Deep Vein Thrombosis, Hypotension, Myocardial Infarction, Peripheral Arterial Disease, Peripheral Venous Disease, Phlebitis, Vasculitis Gastrointestinal Denies history of Cirrhosis , Colitis, Crohn s, Hepatitis A, Hepatitis B, Hepatitis C Endocrine Patient has history of Type II Diabetes Denies history of Type I Diabetes Genitourinary Denies history of End Stage Renal Disease Immunological Denies history of Lupus Erythematosus, Raynaud s, Scleroderma Integumentary (Skin) Denies history of History of Burn, History of pressure wounds Musculoskeletal Patient has history of Osteoarthritis Denies history of Gout, Rheumatoid Arthritis, Osteomyelitis Neurologic Patient has history of Neuropathy Denies history of Dementia, Quadriplegia, Paraplegia, Seizure  Disorder Oncologic Patient has history of Received Chemotherapy - last 2016, Received Radiation - 2 years ago Psychiatric Denies history of Anorexia/bulimia, Confinement Anxiety Hospitalization/Surgery History - [facility] 8/1/12020. Medical And Surgical History Notes Oncologic Lawson, Haja A. ([id number]) multiple myeloma - no treatment currently Review of Systems (ROS) Eyes Denies complaints or symptoms of Dry Eyes, Vision Changes, Glasses / Contacts. Ear/Nose/Mouth/Throat Denies complaints or symptoms of Difficult clearing ears, Sinusitis. Hematologic/Lymphatic Denies complaints or symptoms of Bleeding / Clotting Disorders, Human Immunodeficiency Virus. Respiratory Denies complaints or symptoms of Chronic or frequent coughs, Shortness of Breath. Cardiovascular Denies complaints or symptoms of Chest pain, LE edema. Gastrointestinal Denies complaints or symptoms of Frequent diarrhea, Nausea, Vomiting. Endocrine Denies complaints or symptoms of Hepatitis, Thyroid disease, Polydypsia (Excessive Thirst). Genitourinary Denies complaints or symptoms of Kidney failure/ Dialysis, Incontinence/dribbling. Immunological Denies complaints or symptoms of Hives, Itching. Integumentary (Skin) Complains or has symptoms of Wounds. Denies complaints or symptoms of Bleeding or bruising tendency, Breakdown, Swelling. Musculoskeletal Denies complaints or symptoms of Muscle Pain, Muscle Weakness. Neurologic Denies complaints or symptoms of Numbness/parasthesias, Focal/Weakness. Psychiatric Denies complaints or symptoms of Anxiety, Claustrophobia. Objective Constitutional Sitting or standing Blood Pressure is within target range for patient.. Pulse regular and within target range for patient.Marland Kitchen Respirations regular, non-labored and within target range.. Temperature is normal and within the target range for the patient.Marland Kitchen appears in no distress. Vitals Time Taken: 9:59 AM, Height: 62 in, Source:  Stated, Weight: 155 lbs, Source: Stated, BMI: 28.3, Temperature: 97.8 F, Pulse: 72 bpm, Respiratory Rate: 16 breaths/min, Blood Pressure: 142/48 mmHg. Eyes Conjunctivae clear. No discharge. Respiratory Respiratory effort is easy and symmetric bilaterally. Rate is normal at rest and on room air.. Cardiovascular Femoral pulses are palpable. Dorsalis pedis pulses are palpable posterior tibial pulses are palpable. Rasnick, Deone A. ([id number]) Lymphatic None palpable in the popliteal or inguinal area. Neurological Reduced sensation in the bilateral feet. Psychiatric No evidence of depression, anxiety, or agitation. Calm, cooperative, and communicative. Appropriate interactions and affect.. General Notes: Wound examination; there is nothing open on the patient's right heel although as usual she still complains of right lateral heel tenderness. She has a very superficial wound on the left lateral heel. Integumentary (Hair, Skin) No erythema around the wound. Wound #5 status is Open. Original cause of wound was Pressure Injury. The wound is located on the Left,Lateral Calcaneus. The wound measures 0.5cm length x 0.3cm width x 0cm depth; 0.118cm^2 area and 0.012cm^3 volume. There is no tunneling or undermining noted. There is a none present amount of drainage noted. The wound margin is flat and intact. There is no granulation within the wound bed. There is a small (1-33%) amount of necrotic tissue within the wound bed including Eschar. Assessment Active Problems ICD-10 Type 2 diabetes mellitus with foot ulcer Type  2 diabetes mellitus with diabetic polyneuropathy Non-pressure chronic ulcer of left heel and midfoot limited to breakdown of skin Plan Wound Cleansing: Wound #5 Left,Lateral Calcaneus: Dial antibacterial soap, wash wounds, rinse and pat dry prior to dressing wounds Anesthetic (add to Medication List): Wound #5 Left,Lateral Calcaneus: Topical Lidocaine 4% cream applied to wound  bed prior to debridement (In Clinic Only). Primary Wound Dressing: Wound #5 Left,Lateral Calcaneus: Silver Collagen - moistened with hydrogel Secondary Dressing: Wound #5 Left,Lateral Calcaneus: Boardered Foam Dressing Dressing Change Frequency: Wound #5 Left,Lateral Calcaneus: Change Dressing Monday, Wednesday, Friday Follow-up Appointments: Wound #5 Left,Lateral Calcaneus: Blazejewski, Jireh A. ([id number]) Return Appointment in 2 weeks. Edema Control: Wound #5 Left,Lateral Calcaneus: Elevate legs to the level of the heart and pump ankles as often as possible Off-Loading: Wound #5 Left,Lateral Calcaneus: Other: - Keep pressure off of heels 1. Silver collagen moistened with hydrogel border foam 2. This is a recurrent problem for this patient with wounds in these areas. I have recommended offloading these areas previously and I am not sure how well she does it at night although she is wearing open heeled shoes/slippers. 3. She is a type II diabetic with peripheral neuropathy however historically I think these occurred while she was sick with Covid in the hospital in August Electronic Signature(s) Signed: 11/29/2018 5:09:09 PM By: [name] MD Entered By: [name] on 11/29/2018 11:16:08 Bogdan, Sofija A. ([id number]) -------------------------------------------------------------------------------- ROS/PFSH Details Patient Name: Dotson, Zayley A. Date of Service: 11/29/2018 9:45 AM Medical Record Number: [id number] Patient Account Number: [id number] Date of Birth/Sex: 03/23/1940 (78 y.o. F) Treating RN: Army Melia Primary Care Provider: Lavera [name] Other Clinician: Referring Provider: Referral, Self Treating Provider/Extender: Tito Dine in Treatment: 0 Information Obtained From Patient Eyes Complaints and Symptoms: Negative for: Dry Eyes; Vision Changes; Glasses / Contacts Medical History: Positive for: Cataracts - removed Negative for: Glaucoma; Optic  Neuritis Ear/Nose/Mouth/Throat Complaints and Symptoms: Negative for: Difficult clearing ears; Sinusitis Medical History: Negative for: Chronic sinus problems/congestion; Middle ear problems Hematologic/Lymphatic Complaints and Symptoms: Negative for: Bleeding / Clotting Disorders; Human Immunodeficiency Virus Medical History: Positive for: Anemia Negative for: Hemophilia; Human Immunodeficiency Virus; Lymphedema; Sickle Cell Disease Respiratory Complaints and Symptoms: Negative for: Chronic or frequent coughs; Shortness of Breath Medical History: Negative for: Aspiration; Asthma; Chronic Obstructive Pulmonary Disease (COPD); Pneumothorax; Sleep Apnea; Tuberculosis Cardiovascular Complaints and Symptoms: Negative for: Chest pain; LE edema Medical History: Positive for: Congestive Heart Failure; Coronary Artery Disease; Hypertension Negative for: Angina; Arrhythmia; Deep Vein Thrombosis; Hypotension; Myocardial Infarction; Peripheral Arterial Disease; Peripheral Venous Disease; Phlebitis; Vasculitis Gastrointestinal Dibble, Bayleigh A. ([id number]) Complaints and Symptoms: Negative for: Frequent diarrhea; Nausea; Vomiting Medical History: Negative for: Cirrhosis ; Colitis; Crohnos; Hepatitis A; Hepatitis B; Hepatitis C Endocrine Complaints and Symptoms: Negative for: Hepatitis; Thyroid disease; Polydypsia (Excessive Thirst) Medical History: Positive for: Type II Diabetes Negative for: Type I Diabetes Time with diabetes: at least 20 years Treated with: Oral agents Blood sugar tested every day: Yes Tested : QD Blood sugar testing results: Breakfast: 121 Genitourinary Complaints and Symptoms: Negative for: Kidney failure/ Dialysis; Incontinence/dribbling Medical History: Negative for: End Stage Renal Disease Immunological Complaints and Symptoms: Negative for: Hives; Itching Medical History: Negative for: Lupus Erythematosus; Raynaudos; Scleroderma Integumentary  (Skin) Complaints and Symptoms: Positive for: Wounds Negative for: Bleeding or bruising tendency; Breakdown; Swelling Medical History: Negative for: History of Burn; History of pressure wounds Musculoskeletal Complaints and Symptoms: Negative for: Muscle Pain; Muscle Weakness Medical History: Positive for: Osteoarthritis Negative for: Gout; Rheumatoid Arthritis; Osteomyelitis  Neurologic Complaints and Symptoms: Negative for: Numbness/parasthesias; Focal/Weakness Aigner, Zoanne A. ([id number]) Medical History: Positive for: Neuropathy Negative for: Dementia; Quadriplegia; Paraplegia; Seizure Disorder Psychiatric Complaints and Symptoms: Negative for: Anxiety; Claustrophobia Medical History: Negative for: Anorexia/bulimia; Confinement Anxiety Oncologic Medical History: Positive for: Received Chemotherapy - last 2016; Received Radiation - 2 years ago Past Medical History Notes: multiple myeloma - no treatment currently HBO Extended History Items Eyes: Cataracts Immunizations Pneumococcal Vaccine: Received Pneumococcal Vaccination: No Tetanus Vaccine: Last tetanus shot: 03/12/2015 Immunization Notes: up to date Implantable Devices None Hospitalization / Surgery History Type of Hospitalization/Surgery [facility] 8/1/12020 Family and Social History Cancer: Yes - Father; Diabetes: Yes - Mother; Heart Disease: Yes - Mother; Hereditary Spherocytosis: No; Hypertension: Yes - Mother; Kidney Disease: No; Lung Disease: No; Seizures: No; Stroke: No; Thyroid Problems: No; Tuberculosis: No; Never smoker; Marital Status - Divorced; Alcohol Use: Never; Drug Use: No History; Caffeine Use: Moderate; Financial Concerns: No; Food, Clothing or Shelter Needs: No; Support System Lacking: No; Transportation Concerns: No Electronic Signature(s) Signed: 11/29/2018 4:09:40 PM By: Army Melia Signed: 11/29/2018 5:09:09 PM By: [name] MD Entered By: Army Melia on 11/29/2018 10:05:05 Holtzclaw,  Cassadie A. ([id number]) -------------------------------------------------------------------------------- SuperBill Details Patient Name: Propps, Isa A. Date of Service: 11/29/2018 Medical Record Number: [id number] Patient Account Number: [id number] Date of Birth/Sex: March 04, 1940 (78 y.o. F) Treating RN: Cornell Barman Primary Care Provider: Lavera [name] Other Clinician: Referring Provider: Referral, Self Treating Provider/Extender: Tito Dine in Treatment: 0 Diagnosis Coding ICD-10 Codes Code Description E11.621 Type 2 diabetes mellitus with foot ulcer E11.42 Type 2 diabetes mellitus with diabetic polyneuropathy L97.421 Non-pressure chronic ulcer of left heel and midfoot limited to breakdown of skin Facility Procedures CPT4 Code: [id number] Description: 99214 - WOUND CARE VISIT-LEV 4 EST PT Modifier: Quantity: 1 Physician Procedures CPT4 Code Description: [id number] 99213 - WC PHYS LEVEL 3 - EST PT ICD-10 Diagnosis Description E11.621 Type 2 diabetes mellitus with foot ulcer L97.421 Non-pressure chronic ulcer of left heel and midfoot limited t Modifier: o breakdown of sk Quantity: 1 in Electronic Signature(s) Signed: 11/29/2018 5:09:09 PM By: [name] MD Entered By: [name] on 11/29/2018 11:16:57

## 2018-11-30 NOTE — Progress Notes (Signed)
SHAIN, PAPST ([id number]) Visit Report for 11/29/2018 Allergy List Details Patient Name: Stick, Lyle A. Date of Service: 11/29/2018 9:45 AM Medical Record Number: [id number] Patient Account Number: [id number] Date of Birth/Sex: Oct 28, 1940 (78 y.o. F) Treating RN: Army Melia Primary Care Jackquelyn Sundberg: Lavera [name] Other Clinician: Referring Riannah Stagner: Referral, Self Treating Amir Glaus/Extender: Ricard Dillon Weeks in Treatment: 0 Allergies Active Allergies Aricept Benzodiazepines Opioids - Morphine Analogues penicillin Sulfa (Sulfonamide Antibiotics) Statins-Hmg-Coa Reductase Inhibitors Allergy Notes Electronic Signature(s) Signed: 11/29/2018 4:09:40 PM By: Army Melia Entered By: Army Melia on 11/29/2018 10:02:20 Simonich, Natalia A. ([id number]) -------------------------------------------------------------------------------- Arrival Information Details Patient Name: Kunkler, [place] A. Date of Service: 11/29/2018 9:45 AM Medical Record Number: [id number] Patient Account Number: [id number] Date of Birth/Sex: 1940-12-25 (78 y.o. F) Treating RN: Army Melia Primary Care Jahlon Baines: Lavera [name] Other Clinician: Referring Lety Cullens: Referral, Self Treating Rebacca Votaw/Extender: Tito Dine in Treatment: 0 Visit Information Patient Arrived: Walker Arrival Time: 09:56 Accompanied By: self Transfer Assistance: None Patient Identification Verified: Yes History Since Last Visit Added or deleted any medications: No Any new allergies or adverse reactions: No Had a fall or experienced change in activities of daily living that may affect risk of falls: No Signs or symptoms of abuse/neglect since last visito No Hospitalized since last visit: No Electronic Signature(s) Signed: 11/29/2018 4:09:40 PM By: Army Melia Entered By: Army Melia on 11/29/2018 09:59:35 Hing, Donika A. ([id number]) -------------------------------------------------------------------------------- Clinic Level  of Care Assessment Details Patient Name: Roessner, Yacine A. Date of Service: 11/29/2018 9:45 AM Medical Record Number: [id number] Patient Account Number: [id number] Date of Birth/Sex: [birth year] (78 y.o. F) Treating RN: Cornell Barman Primary Care Sibbie Flammia: Lavera [name] Other Clinician: Referring Shaan Rhoads: Referral, Self Treating Monta Police/Extender: Tito Dine in Treatment: 0 Clinic Level of Care Assessment Items TOOL 2 Quantity Score []  - Use when only an EandM is performed on the INITIAL visit 0 ASSESSMENTS - Nursing Assessment / Reassessment X - General Physical Exam (combine w/ comprehensive assessment (listed just below) when 1 20 performed on new pt. evals) X- 1 25 Comprehensive Assessment (HX, ROS, Risk Assessments, Wounds Hx, etc.) ASSESSMENTS - Wound and Skin Assessment / Reassessment X - Simple Wound Assessment / Reassessment - one wound 1 5 []  - 0 Complex Wound Assessment / Reassessment - multiple wounds []  - 0 Dermatologic / Skin Assessment (not related to wound area) ASSESSMENTS - Ostomy and/or Continence Assessment and Care []  - Incontinence Assessment and Management 0 []  - 0 Ostomy Care Assessment and Management (repouching, etc.) PROCESS - Coordination of Care X - Simple Patient / Family Education for ongoing care 1 15 []  - 0 Complex (extensive) Patient / Family Education for ongoing care X- 1 10 Staff obtains Programmer, systems, Records, Test Results / Process Orders []  - 0 Staff telephones HHA, Nursing Homes / Clarify orders / etc []  - 0 Routine Transfer to another Facility (non-emergent condition) []  - 0 Routine Hospital Admission (non-emergent condition) []  - 0 New Admissions / Biomedical engineer / Ordering NPWT, Apligraf, etc. []  - 0 Emergency Hospital Admission (emergent condition) X- 1 10 Simple Discharge Coordination []  - 0 Complex (extensive) Discharge Coordination PROCESS - Special Needs []  - Pediatric / Minor Patient Management 0 []  -  0 Isolation Patient Management Cueva, Maegen A. ([id number]) []  - 0 Hearing / Language / Visual special needs []  - 0 Assessment of Community assistance (transportation, D/C planning, etc.) []  - 0 Additional assistance / Altered mentation []  - 0 Support Surface(s) Assessment (bed, cushion, seat, etc.) INTERVENTIONS - Wound Cleansing /  Measurement X - Wound Imaging (photographs - any number of wounds) 1 5 []  - 0 Wound Tracing (instead of photographs) X- 1 5 Simple Wound Measurement - one wound []  - 0 Complex Wound Measurement - multiple wounds X- 1 5 Simple Wound Cleansing - one wound []  - 0 Complex Wound Cleansing - multiple wounds INTERVENTIONS - Wound Dressings []  - Small Wound Dressing one or multiple wounds 0 X- 1 15 Medium Wound Dressing one or multiple wounds []  - 0 Large Wound Dressing one or multiple wounds []  - 0 Application of Medications - injection INTERVENTIONS - Miscellaneous []  - External ear exam 0 []  - 0 Specimen Collection (cultures, biopsies, blood, body fluids, etc.) []  - 0 Specimen(s) / Culture(s) sent or taken to Lab for analysis []  - 0 Patient Transfer (multiple staff / Civil Service fast streamer / Similar devices) []  - 0 Simple Staple / Suture removal (25 or less) []  - 0 Complex Staple / Suture removal (26 or more) []  - 0 Hypo / Hyperglycemic Management (close monitor of Blood Glucose) X- 1 15 Ankle / Brachial Index (ABI) - do not check if billed separately Has the patient been seen at the hospital within the last three years: Yes Total Score: 130 Level Of Care: New/Established - Level 4 Electronic Signature(s) Signed: 11/30/2018 1:15:06 PM By: Gretta Cool, BSN, RN, CWS, Kim RN, BSN Entered By: Gretta Cool, BSN, RN, CWS, Kim on 11/29/2018 10:27:48 Colello, [name] ([id number]) -------------------------------------------------------------------------------- Encounter Discharge Information Details Patient Name: Paino, Tajuana A. Date of Service: 11/29/2018 9:45 AM Medical  Record Number: [id number] Patient Account Number: [id number] Date of Birth/Sex: 04/09/1940 (78 y.o. F) Treating RN: Cornell Barman Primary Care Cordarrel Stiefel: Lavera [name] Other Clinician: Referring Angelo Prindle: Referral, Self Treating Leslee Haueter/Extender: Tito Dine in Treatment: 0 Encounter Discharge Information Items Discharge Condition: Stable Ambulatory Status: Walker Discharge Destination: Home Transportation: Private Auto Accompanied By: self Schedule Follow-up Appointment: Yes Clinical Summary of Care: Electronic Signature(s) Signed: 11/30/2018 1:15:06 PM By: Gretta Cool, BSN, RN, CWS, Kim RN, BSN Entered By: Gretta Cool, BSN, RN, CWS, Kim on 11/29/2018 10:33:17 Hanlon, [name] ([id number]) -------------------------------------------------------------------------------- Lower Extremity Assessment Details Patient Name: Catino, Margretta A. Date of Service: 11/29/2018 9:45 AM Medical Record Number: [id number] Patient Account Number: [id number] Date of Birth/Sex: 01-May-1940 (78 y.o. F) Treating RN: Army Melia Primary Care Merry Pond: Lavera [name] Other Clinician: Referring Azael Ragain: Referral, Self Treating Lundon Rosier/Extender: Tito Dine in Treatment: 0 Edema Assessment Assessed: [Left: No] [Right: No] Edema: [Left: N] [Right: o] Calf Left: Right: Point of Measurement: 32 cm From Medial Instep 35.5 cm cm Ankle Left: Right: Point of Measurement: 9 cm From Medial Instep 20 cm cm Vascular Assessment Pulses: Dorsalis Pedis Palpable: [Left:Yes] Doppler Audible: [Left:Yes] Posterior Tibial Palpable: [Left:Yes] Doppler Audible: [Left:Yes] Popliteal Palpable: [Left:Yes] Notes Unable to pump cuff up d/t pain Electronic Signature(s) Signed: 11/29/2018 4:09:40 PM By: Army Melia Entered By: Army Melia on 11/29/2018 10:15:32 Amberg, Abbigayle A. ([id number]) -------------------------------------------------------------------------------- Multi Wound Chart Details Patient Name: Delaughter,  Emmamae A. Date of Service: 11/29/2018 9:45 AM Medical Record Number: [id number] Patient Account Number: [id number] Date of Birth/Sex: 13-Nov-1940 (78 y.o. F) Treating RN: Cornell Barman Primary Care Haley Roza: Lavera [name] Other Clinician: Referring Cabella Kimm: Referral, Self Treating Vamsi Apfel/Extender: Tito Dine in Treatment: 0 Vital Signs Height(in): 62 Pulse(bpm): 72 Weight(lbs): 155 Blood Pressure(mmHg): 142/48 Body Mass Index(BMI): 28 Temperature(F): 97.8 Respiratory Rate 16 (breaths/min): Photos: [N/A:N/A] Wound Location: Left Calcaneus - Lateral N/A N/A Wounding Event: Pressure Injury N/A N/A Primary Etiology: Diabetic Wound/Ulcer of the N/A  N/A Lower Extremity Comorbid History: Cataracts, Anemia, N/A N/A Congestive Heart Failure, Coronary Artery Disease, Hypertension, Type II Diabetes, Osteoarthritis, Neuropathy, Received Chemotherapy, Received Radiation Date Acquired: 08/14/2018 N/A N/A Weeks of Treatment: 0 N/A N/A Wound Status: Open N/A N/A Measurements L x W x D 0.5x0.3x0 N/A N/A (cm) Area (cm) : 0.118 N/A N/A Volume (cm) : 0.012 N/A N/A Classification: Grade 1 N/A N/A Exudate Amount: None Present N/A N/A Wound Margin: Flat and Intact N/A N/A Granulation Amount: None Present (0%) N/A N/A Necrotic Amount: Small (1-33%) N/A N/A Necrotic Tissue: Eschar N/A N/A Exposed Structures: Fascia: No N/A N/A Fat Layer (Subcutaneous Tissue) Exposed: No Tendon: No Spanbauer, Rica A. ([id number]) Muscle: No Joint: No Bone: No Epithelialization: None N/A N/A Treatment Notes Electronic Signature(s) Signed: 11/30/2018 1:15:06 PM By: Gretta Cool, BSN, RN, CWS, Kim RN, BSN Entered By: Gretta Cool, BSN, RN, CWS, Kim on 11/29/2018 10:22:12 Lassen, [name] ([id number]) -------------------------------------------------------------------------------- Comunas Details Patient Name: Lenhard, Lesleyann A. Date of Service: 11/29/2018 9:45 AM Medical Record Number:  [id number] Patient Account Number: [id number] Date of Birth/Sex: 05-14-1940 (78 y.o. F) Treating RN: Cornell Barman Primary Care Thelma Lorenzetti: Lavera [name] Other Clinician: Referring Chatara Lucente: Referral, Self Treating Pati Thinnes/Extender: Tito Dine in Treatment: 0 Active Inactive Orientation to the Wound Care Program Nursing Diagnoses: Knowledge deficit related to the wound healing center program Goals: Patient/caregiver will verbalize understanding of the [facility] Program Date Initiated: 11/29/2018 Target Resolution Date: 12/06/2018 Goal Status: Active Interventions: Provide education on orientation to the wound center Notes: Peripheral Neuropathy Nursing Diagnoses: Knowledge deficit related to disease process and management of peripheral neurovascular dysfunction Potential alteration in peripheral tissue perfusion (select prior to confirmation of diagnosis) Goals: Patient/caregiver will verbalize understanding of disease process and disease management Date Initiated: 11/29/2018 Target Resolution Date: 12/06/2018 Goal Status: Active Interventions: Assess signs and symptoms of neuropathy upon admission and as needed Notes: Pressure Nursing Diagnoses: Knowledge deficit related to management of pressures ulcers Goals: Patient will remain free of pressure ulcers Date Initiated: 11/29/2018 Target Resolution Date: 12/06/2018 Goal Status: Active Interventions: Kilcrease, Kelseigh A. ([id number]) Provide education on pressure ulcers Notes: Electronic Signature(s) Signed: 11/30/2018 1:15:06 PM By: Gretta Cool, BSN, RN, CWS, Kim RN, BSN Entered By: Gretta Cool, BSN, RN, CWS, Kim on 11/29/2018 10:21:52 Gasbarro, Florrie A. ([id number]) -------------------------------------------------------------------------------- Pain Assessment Details Patient Name: Bruun, Chanya A. Date of Service: 11/29/2018 9:45 AM Medical Record Number: [id number] Patient Account Number: [id number] Date of Birth/Sex:  May 27, 1940 (78 y.o. F) Treating RN: Army Melia Primary Care Gahel Safley: Lavera [name] Other Clinician: Referring Adelyna Brockman: Referral, Self Treating Lulla Linville/Extender: Tito Dine in Treatment: 0 Active Problems Location of Pain Severity and Description of Pain Patient Has Paino Yes Site Locations Pain Location: Pain in Ulcers Rate the pain. Current Pain Level: 7 Pain Management and Medication Current Pain Management: Electronic Signature(s) Signed: 11/29/2018 4:09:40 PM By: Army Melia Entered By: Army Melia on 11/29/2018 09:59:45 Grunewald, Ysenia A. ([id number]) -------------------------------------------------------------------------------- Patient/Caregiver Education Details Patient Name: Bostwick, Lowana A. Date of Service: 11/29/2018 9:45 AM Medical Record Number: [id number] Patient Account Number: [id number] Date of Birth/Gender: 06-22-1940 (78 y.o. F) Treating RN: Cornell Barman Primary Care Physician: Lavera [name] Other Clinician: Referring Physician: Referral, Self Treating Physician/Extender: Tito Dine in Treatment: 0 Education Assessment Education Provided To: Patient Education Topics Provided Welcome To [facility]: Handouts: Welcome To [facility] Methods: Demonstration Responses: State content correctly Wound/Skin Impairment: Handouts: Caring for Your Ulcer Methods: Demonstration, Explain/Verbal Responses: State content correctly Electronic  Signature(s) Signed: 11/30/2018 1:15:06 PM By: Gretta Cool, BSN, RN, CWS, Kim RN, BSN Entered By: Gretta Cool, BSN, RN, CWS, Kim on 11/29/2018 10:28:33 Langelier, [name] ([id number]) -------------------------------------------------------------------------------- Wound Assessment Details Patient Name: Almendariz, Cyntha A. Date of Service: 11/29/2018 9:45 AM Medical Record Number: [id number] Patient Account Number: [id number] Date of Birth/Sex: December 10, 1940 (78 y.o. F) Treating RN: Army Melia Primary Care  Doratha Mcswain: Lavera [name] Other Clinician: Referring Jeslie Lowe: Referral, Self Treating Olukemi Panchal/Extender: Ricard Dillon Weeks in Treatment: 0 Wound Status Wound Number: 5 Primary Diabetic Wound/Ulcer of the Lower Extremity Etiology: Wound Location: Left Calcaneus - Lateral Wound Open Wounding Event: Pressure Injury Status: Date Acquired: 08/14/2018 Comorbid Cataracts, Anemia, Congestive Heart Failure, Weeks Of Treatment: 0 History: Coronary Artery Disease, Hypertension, Type II Clustered Wound: No Diabetes, Osteoarthritis, Neuropathy, Received Chemotherapy, Received Radiation Photos Wound Measurements Length: (cm) 0.5 % Reductio Width: (cm) 0.3 % Reductio Depth: (cm) 0 Epithelial Area: (cm) 0.118 Tunneling Volume: (cm) 0.012 Undermini n in Area: n in Volume: ization: None : No ng: No Wound Description Classification: Grade 1 Wound Margin: Flat and Intact Exudate Amount: None Present Wound Bed Granulation Amount: None Present (0%) Exposed Structure Necrotic Amount: Small (1-33%) Fascia Exposed: No Necrotic Quality: Eschar Fat Layer (Subcutaneous Tissue) Exposed: No Tendon Exposed: No Muscle Exposed: No Joint Exposed: No Bone Exposed: No Treatment Notes Wound #5 (Left, Lateral Calcaneus) Biffle, Concha A. ([id number]) Notes Prisma Ag, Hydrogel and border foam dressing Electronic Signature(s) Signed: 11/29/2018 4:09:40 PM By: Army Melia Entered By: Army Melia on 11/29/2018 10:12:10 Reading, Thressa A. ([id number]) -------------------------------------------------------------------------------- Vitals Details Patient Name: Lindbloom, Haelie A. Date of Service: 11/29/2018 9:45 AM Medical Record Number: [id number] Patient Account Number: [id number] Date of Birth/Sex: 11-Sep-1940 (78 y.o. F) Treating RN: Army Melia Primary Care Asusena Sigley: Lavera [name] Other Clinician: Referring Nakeshia Waldeck: Referral, Self Treating Syan Cullimore/Extender: Tito Dine in Treatment:  0 Vital Signs Time Taken: 09:59 Temperature (F): 97.8 Height (in): 62 Pulse (bpm): 72 Source: Stated Respiratory Rate (breaths/min): 16 Weight (lbs): 155 Blood Pressure (mmHg): 142/48 Source: Stated Reference Range: 80 - 120 mg / dl Body Mass Index (BMI): 28.3 Electronic Signature(s) Signed: 11/29/2018 4:09:40 PM By: Army Melia Entered By: Army Melia on 11/29/2018 10:01:09

## 2018-12-13 ENCOUNTER — Encounter: Payer: Medicare Other | Attending: Internal Medicine | Admitting: Internal Medicine

## 2018-12-13 ENCOUNTER — Other Ambulatory Visit: Payer: Self-pay

## 2018-12-13 DIAGNOSIS — Z880 Allergy status to penicillin: Secondary | ICD-10-CM | POA: Insufficient documentation

## 2018-12-13 DIAGNOSIS — I10 Essential (primary) hypertension: Secondary | ICD-10-CM | POA: Diagnosis not present

## 2018-12-13 DIAGNOSIS — L97421 Non-pressure chronic ulcer of left heel and midfoot limited to breakdown of skin: Secondary | ICD-10-CM | POA: Insufficient documentation

## 2018-12-13 DIAGNOSIS — Z7984 Long term (current) use of oral hypoglycemic drugs: Secondary | ICD-10-CM | POA: Diagnosis not present

## 2018-12-13 DIAGNOSIS — E1142 Type 2 diabetes mellitus with diabetic polyneuropathy: Secondary | ICD-10-CM | POA: Insufficient documentation

## 2018-12-13 DIAGNOSIS — I2510 Atherosclerotic heart disease of native coronary artery without angina pectoris: Secondary | ICD-10-CM | POA: Diagnosis not present

## 2018-12-13 DIAGNOSIS — I252 Old myocardial infarction: Secondary | ICD-10-CM | POA: Insufficient documentation

## 2018-12-13 DIAGNOSIS — Z951 Presence of aortocoronary bypass graft: Secondary | ICD-10-CM | POA: Diagnosis not present

## 2018-12-13 DIAGNOSIS — C9110 Chronic lymphocytic leukemia of B-cell type not having achieved remission: Secondary | ICD-10-CM | POA: Diagnosis not present

## 2018-12-13 DIAGNOSIS — C9000 Multiple myeloma not having achieved remission: Secondary | ICD-10-CM | POA: Diagnosis not present

## 2018-12-13 DIAGNOSIS — E11621 Type 2 diabetes mellitus with foot ulcer: Secondary | ICD-10-CM | POA: Diagnosis not present

## 2018-12-13 DIAGNOSIS — Z8619 Personal history of other infectious and parasitic diseases: Secondary | ICD-10-CM | POA: Diagnosis not present

## 2018-12-13 DIAGNOSIS — L97429 Non-pressure chronic ulcer of left heel and midfoot with unspecified severity: Secondary | ICD-10-CM | POA: Diagnosis present

## 2018-12-13 NOTE — Progress Notes (Signed)
[name], [name] ([id number]) Visit Report for 12/13/2018 Debridement Details Patient Name: [name], [name] Date of Service: 12/13/2018 10:30 AM Medical Record Number: [id number] Patient Account Number: [id number] Date of Birth/Sex: Jul 17, 1940 (78 y.o. F) Treating RN: Harold Barban Primary Care Provider: Lavera [name] Other Clinician: Referring Provider: Lavera [name] Treating Provider/Extender: Tito Dine in Treatment: 2 Debridement Performed for Wound #5 Left,Lateral Calcaneus Assessment: Performed By: Physician Ricard Dillon, MD Debridement Type: Debridement Severity of Tissue Pre Fat layer exposed Debridement: Level of Consciousness (Pre- Awake and Alert procedure): Pre-procedure Verification/Time Yes - 11:30 Out Taken: Start Time: 11:30 Pain Control: Lidocaine Total Area Debrided (L x W): 0.1 (cm) x 0.1 (cm) = 0.01 (cm) Tissue and other material Non-Viable, Subcutaneous, Skin: Epidermis debrided: Level: Skin/Subcutaneous Tissue Debridement Description: Excisional Instrument: Curette Bleeding: Minimum Hemostasis Achieved: Pressure End Time: 11:32 Procedural Pain: 0 Post Procedural Pain: 0 Response to Treatment: Procedure was tolerated well Level of Consciousness Awake and Alert (Post-procedure): Post Debridement Measurements of Total Wound Length: (cm) 0.1 Width: (cm) 0.1 Depth: (cm) 0.1 Volume: (cm) 0.001 Character of Wound/Ulcer Post Debridement: Improved Severity of Tissue Post Debridement: Fat layer exposed Post Procedure Diagnosis Same as Pre-procedure Electronic Signature(s) Signed: 12/13/2018 4:10:47 PM By: Harold Barban Signed: 12/13/2018 4:48:24 PM By: [name] MD Entered By: [name] on 12/13/2018 12:20:39 [name], [name] ([id number]) [name], [name] ([id number]) -------------------------------------------------------------------------------- HPI Details Patient Name: [name], [name] Date of Service: 12/13/2018 10:30  AM Medical Record Number: [id number] Patient Account Number: [id number] Date of Birth/Sex: 07/21/1940 (78 y.o. F) Treating RN: Harold Barban Primary Care Provider: Lavera [name] Other Clinician: Referring Provider: Lavera [name] Treating Provider/Extender: Tito Dine in Treatment: 2 History of Present Illness HPI Description: 12/24/15; [name] is a 78 year old woman who is the mother of one of our other clinic patient's [name] He comes [place] today for review a nonhealing surgical CABG wound. The patient underwent CABG o2 on October 31 17 at [facility]. She apparently had had a myocardial infarction in October but briefly presented with shortness of breath. She underwent surgery which I understand was uneventful. According to the patient her surgical incision never really healed in 2 spots. One is at the inferior recess question drain site, the other more superiorly about an inch from the superior surface of this it incision. She has been followed by her surgeon at [facility] who felt that both areas which he'll according to the patient. She was put on Augmentin 2 weeks ago and she is finished a course of this culture at that time showed Escherichia coli which should've been sensitive. The patient also relates that she has a history of allergy to ampicillin. Besides this the patient has a history of type 2 diabetes on metformin, multiple myeloma. She had a chest x-ray in mid October the did not show any abnormalities. I cannot see that she had any specific views of the sternum however. We have her referral note from her primary physician Dr. Bernie Covey. She was concerned about erythema around the wound and as mentioned the culture here showed Escherichia coli which should have been Augmentin sensitive but was resistant to ampicillin, Keflex third generation cephalosporins quinolones and sulfonamides. [ESBL). The patient does not complain of fever or chills excessive  pain. She does say that the lower surgical wound has been draining what looks to her like pus. She has been using triple antibiotic cream. 12/31/15 superior wound actually looks somewhat better. Inferiorly still a wound with  some depth greater than 2 cm. Using Aquacel Ag. Culture I did of the drainage from the inferior wound and this surgical incision last week was negative 01/07/16; superior wound still requires debridement of surface eschar and nonviable subcutaneous tissue. Inferior wound measured at 1.8 cm. What I can see of the tissue here does not look very healthy. Original culture that I did on her presentation was negative however 01/27/16; the superior wound is closed she still has a deep probing wound of roughly 2 cm inferiorly. There is no overt infection no surrounding erythema. The patient tells me that 2 weeks ago she was developing chest pain and underwent a nuclear stress test. This was apparently negative however she developed chest pain shortly thereafter and had to go have a cardiac catheterization that did not show blockages. Since then she is not having chest pain but has unrelenting nausea making it difficult for her to eat. We reviewed her medications and she is on aspirin and Plavix however she apparently has a stent in place as well. 02/03/16; still using silver alginate. Depth of this is down from 1.8 cm the circumference is smaller and the depth down by 2 mm red no drainage and no surrounding infection 02/10/16; still using silver alginate depth down to 1.3 cm today measured by myself. Appears to be making slow progress. There is no surrounding tenderness.. The patient states she has unrelenting nausea which is nonexertional. Not related to meals. She has had no vomiting. She also tells me she is had previous lap band surgery but does not give clear reflux type symptoms 02/25/16; the wound orifice of this lady's wound on her chest is down to 1 cm in depth and much smaller  in terms of the overall diameter. She tells me that she was packing the wound recently and developed quite a bit of bleeding that took several hours to stop she is on Plavix and aspirin. She is not having any exertional symptoms that sound like coronary artery disease. She is complaining of unrelenting nausea but she is being evaluated by GI for this and apparently has an endoscopy planned for March. We have been using iodoform packing 03/03/16; small wound in terms of orifice but measures 1.2 cm in depth, not much different from last time. She still complains of nausea and has an upper GI series books she is not describing exertional chest pain, shortness of breath, nausea or fatigue. 03/16/16; I follow this patient for a nonhealing surgical wound at a site of a previous CABG scar. She developed cellulitis in the incision postoperatively. Today she arrives with the wound orifice to miniscule to even consider measuring a depth. She came in with a Band-Aid over this, she could not get any of the iodoform packing in the increasingly small orifice 03/31/16; the patient's nonhealing surgical wound at the site of her previous CABG scar/inferior aspect is totally epithelialized. She tells me there was some bleeding out of this which was minimal last week however with careful inspection there is no evidence of any open here everything looks healthy and epithelialize. Surrounding tissue also looks normal [name], [name] ([id number]) the patient arrives today with erythema and a significant blister mostly involving the dorsal aspect of her right fifth finger. There were 2 small almost looked like puncture wounds on the anterior aspect of the DIP joint and the medial aspect of the finger. The area was tender and warm. There was no involvement of any joint 04/20/16; The patient returns today for  one last followup. Her original chest wound has remained closed which is fortunate, She came in last time with a right  5th finger blister and erythema. she did not tolerate the aquacel AG. C+S I did was negative. since I last saw her she has a skin tear on the right lateral elbow while doing a medical test READMISSION 03/23/17;Mrs. Braaten is a now 78 year old woman that we cared for in this clinic about a year ago with nonhealing wounds at the site of her CABG. She has a type II diabetic with diabetic neuropathy. At the point of this dictation I don't have a hemoglobin A1c however she is only on oral agents. She tells me that she noted a blister on her left lateral heel about 3 weeks ago. She brought this to attention of her primary doctor and was given DuoDERM. Later on her daughter actually removed the surface of this and some surrounding skin. She is in for our review of this. She does not have a history of wounds on her lower extremities. She does not have known PAD. She does have neuropathy in her feet ABIs in this clinic were noncompressible bilaterally 03/30/17-she is here in follow up evaluation for a left heel wound. She is voicing no complaints or concerns. She states she could not wear the surgical shoe. She continues to have maceration although not significantly different. We will switch to [facility] and she'll follow-up next week 04/06/17; patient is here for follow-up evaluation of the left heel wound. She has been using Prisma. The area is closed this week. READMISSION 03/01/2018. Patient is back in clinic today. She has type 2 diabetes with diabetic neuropathy. She states over the last week and a half she has noted painful areas on her bilateral outer heels. She has been in this clinic before in March 2019 almost a year ago with areas on her left heel. This healed out very easily. ABIs in our clinic have been consistently noncompressible although she is not known to have a significant arterial issue. She is gone to wearing her open heeled bedroom slippers so as not to put any pressure on the heel although  a week or so ago she was walking in shoes that may have contributed to the areas in question. 03/15/2018; 3-week follow-up. The right heel looks fine. The left heel deep tissue injury looks improved. Surface epithelium is intact. There is still evidence of subcutaneous damage but this is not evolved into a wound so far 3/25; 3-week follow-up. There is no open wound on either heel. She complains of pain in the right heel when she walks however surface epithelium is intact. Peripheral pulses are palpable READMISSION 11/29/2018 Mrs. Badley is a patient we know fairly well in this clinic for a history of wound difficulties. 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 She was here again in 2019 with a wound on the left lateral heel and then again in February and March of this year with bilateral wounds on her lateral heels. She is a type II diabetic with peripheral neuropathy but she is not felt to have PAD although her ABIs have been previously noncompressible. She tells me that she was hospitalized in August at [facility] in [place] for Covid she was in hospital for 3 weeks. She came out of this with apparently bilateral, lateral heel wounds. For the last 3 or 4 weeks she has had [facility] in the area on the right is  actually healed however she has a small remaining open area on the left. She is not been using shoes with heels in them. We had previously recommended that she get bilateral "bunny boots" for use at night. I do not think she ever did this. Past medical history, type 2 diabetes with peripheral neuropathy, apparently a recent diagnosis of CLL, multiple myeloma, coronary artery disease, hypertension and Covid in August 2020 she is apparently still getting PT and OT. She feels generally very weak but otherwise well 12/2; the patient is here for a small wound on the left lateral heel presumably which is a pressure area occurring while she was in hospital in  August. We have been using silver collagen. She laments that she has been religious and offloading this wears bunny boots at night she does not understand why this is not getting better Electronic Signature(s) Signed: 12/13/2018 4:48:24 PM By: [name] MD [name], [name] ([id number]) Entered By: [name] on 12/13/2018 12:19:05 [name], [name] ([id number]) -------------------------------------------------------------------------------- Physical Exam Details Patient Name: Deery, Ifeoluwa A. Date of Service: 12/13/2018 10:30 AM Medical Record Number: [id number] Patient Account Number: [id number] Date of Birth/Sex: December 29, 1940 (78 y.o. F) Treating RN: Harold Barban Primary Care Provider: Lavera [name] Other Clinician: Referring Provider: Lavera [name] Treating Provider/Extender: Ricard Dillon Weeks in Treatment: 2 Constitutional Sitting or standing Blood Pressure is within target range for patient.. Pulse regular and within target range for patient.Marland Kitchen Respirations regular, non-labored and within target range.. Temperature is normal and within the target range for the patient.Marland Kitchen appears in no distress. Notes Wound examination there is nothing open on the right heel. On the left lateral heel the superficial area was still intact surface eschar. Using a #3 curette this was removed along with some mild subcutaneous necrotic debris. There is only a small open area with a healthy base that is left of this original small wound post debridement. There is no evidence of surrounding infection Electronic Signature(s) Signed: 12/13/2018 4:48:24 PM By: [name] MD Entered By: [name] on 12/13/2018 12:22:02 [name], [name] AMarland Kitchen ([id number]) -------------------------------------------------------------------------------- Physician Orders Details Patient Name: [name], [name] Date of Service: 12/13/2018 10:30 AM Medical Record Number: [id number] Patient Account Number: [id number] Date of  Birth/Sex: Mar 27, 1940 (78 y.o. F) Treating RN: Harold Barban Primary Care Provider: Lavera [name] Other Clinician: Referring Provider: Lavera [name] Treating Provider/Extender: Tito Dine in Treatment: 2 Verbal / Phone Orders: No Diagnosis Coding Wound Cleansing Wound #5 Left,Lateral Calcaneus o Dial antibacterial soap, wash wounds, rinse and pat dry prior to dressing wounds Anesthetic (add to Medication List) Wound #5 Left,Lateral Calcaneus o Topical Lidocaine 4% cream applied to wound bed prior to debridement (In Clinic Only). Primary Wound Dressing Wound #5 Left,Lateral Calcaneus o Silver Collagen - moistened with hydrogel Secondary Dressing Wound #5 Left,Lateral Calcaneus o Boardered Foam Dressing Dressing Change Frequency Wound #5 Left,Lateral Calcaneus o Change Dressing Monday, Wednesday, Friday Follow-up Appointments Wound #5 Left,Lateral Calcaneus o Return Appointment in 2 weeks. Edema Control Wound #5 Left,Lateral Calcaneus o Elevate legs to the level of the heart and pump ankles as often as possible Off-Loading Wound #5 Left,Lateral Calcaneus o Other: - Keep pressure off of heels Electronic Signature(s) Signed: 12/13/2018 4:10:47 PM By: Harold Barban Signed: 12/13/2018 4:48:24 PM By: [name] MD Entered By: Harold Barban on 12/13/2018 11:34:20 [name], [name] ([id number]) -------------------------------------------------------------------------------- Problem List Details Patient Name: [name], [name] Date of Service: 12/13/2018 10:30 AM Medical Record Number: [id number] Patient Account Number: [id number] Date of Birth/Sex: [birth year] (  78 y.o. 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 ([id number]) -------------------------------------------------------------------------------- Progress Note Details Patient Name: Scholze, Ailish A. Date of Service: 12/13/2018 10:30 AM Medical Record Number: [id number] Patient Account Number: [id number] Date of Birth/Sex: 05-12-1940 (78 y.o. F) Treating RN: Harold Barban Primary Care Provider: Lavera [name] Other Clinician: Referring Provider: Lavera [name] Treating Provider/Extender: Tito Dine in Treatment: 2 Subjective History of Present Illness (HPI) 12/24/15; Mrs. Blaize is a 78 year old woman who is the mother of one of our other clinic patient's [name] Veazey. He comes [place] today for review a nonhealing surgical CABG wound. The patient underwent CABG o2 on October 31 17 at [facility]. She apparently had had a myocardial infarction in October but briefly presented with shortness of breath. She underwent surgery which I understand was uneventful. According to the patient her surgical incision never really healed in 2 spots. One is at the inferior recess question drain site, the other more superiorly about an inch from the superior surface of this it incision. She has been followed by her surgeon at [facility] who felt that both areas which he'll according to the patient. She was put on Augmentin 2 weeks ago and she is finished a course of this culture at that time showed Escherichia coli which should've been sensitive. The patient also relates that she has a history of allergy to ampicillin. Besides this the  patient has a history of type 2 diabetes on metformin, multiple myeloma. She had a chest x-ray in mid October the did not show any abnormalities. I cannot see that she had any specific views of the sternum however. We have her referral note from her primary physician Dr. Bernie Covey. She was concerned about erythema around the wound and as mentioned the culture here showed Escherichia coli which should have been Augmentin sensitive but was resistant to ampicillin, Keflex third generation cephalosporins quinolones and sulfonamides. [ESBL). The patient does not complain of fever or chills excessive pain. She does say that the lower surgical wound has been draining what looks to her like pus. She has been using triple antibiotic cream. 12/31/15 superior wound actually looks somewhat better. Inferiorly still a wound with some depth greater than 2 cm. Using Aquacel Ag. Culture I did of the drainage from the inferior wound and this surgical incision last week was negative 01/07/16; superior wound still requires debridement of surface eschar and nonviable subcutaneous tissue. Inferior wound measured at 1.8 cm. What I can see of the tissue here does not look very healthy. Original culture that I did on her presentation was negative however 01/27/16; the superior wound is closed she still has a deep probing wound of roughly 2 cm inferiorly. There is no overt infection no surrounding erythema. The patient tells me that 2 weeks ago she was developing chest pain and underwent a nuclear stress test. This was apparently negative however she developed chest pain shortly thereafter and had to go have a cardiac catheterization  that did not show blockages. Since then she is not having chest pain but has unrelenting nausea making it difficult for her to eat. We reviewed her medications and she is on aspirin and Plavix however she apparently has a stent in place as well. 02/03/16; still using silver alginate. Depth  of this is down from 1.8 cm the circumference is smaller and the depth down by 2 mm red no drainage and no surrounding infection 02/10/16; still using silver alginate depth down to 1.3 cm today measured by myself. Appears to be making slow progress. There is no surrounding tenderness.. The patient states she has unrelenting nausea which is nonexertional. Not related to meals. She has had no vomiting. She also tells me she is had previous lap band surgery but does not give clear reflux type symptoms 02/25/16; the wound orifice of this lady's wound on her chest is down to 1 cm in depth and much smaller in terms of the overall diameter. She tells me that she was packing the wound recently and developed quite a bit of bleeding that took several hours to stop she is on Plavix and aspirin. She is not having any exertional symptoms that sound like coronary artery disease. She is complaining of unrelenting nausea but she is being evaluated by GI for this and apparently has an endoscopy planned for March. We have been using iodoform packing 03/03/16; small wound in terms of orifice but measures 1.2 cm in depth, not much different from last time. She still complains of nausea and has an upper GI series books she is not describing exertional chest pain, shortness of breath, nausea or fatigue. 03/16/16; I follow this patient for a nonhealing surgical wound at a site of a previous CABG scar. She developed cellulitis in the incision postoperatively. Today she arrives with the wound orifice to miniscule to even consider measuring a depth. She came in with a Band-Aid over this, she could not get any of the iodoform packing in the increasingly small orifice 03/31/16; the patient's nonhealing surgical wound at the site of her previous CABG scar/inferior aspect is totally epithelialized. She tells me there was some bleeding out of this which was minimal last week however with careful inspection there is no Pasha, Felishia A.  ([id number]) evidence of any open here everything looks healthy and epithelialize. Surrounding tissue also looks normal the patient arrives today with erythema and a significant blister mostly involving the dorsal aspect of her right fifth finger. There were 2 small almost looked like puncture wounds on the anterior aspect of the DIP joint and the medial aspect of the finger. The area was tender and warm. There was no involvement of any joint 04/20/16; The patient returns today for one last followup. Her original chest wound has remained closed which is fortunate, She came in last time with a right 5th finger blister and erythema. she did not tolerate the aquacel AG. C+S I did was negative. since I last saw her she has a skin tear on the right lateral elbow while doing a medical test READMISSION 03/23/17;Mrs. Hemmingway is a now 78 year old woman that we cared for in this clinic about a year ago with nonhealing wounds at the site of her CABG. She has a type II diabetic with diabetic neuropathy. At the point of this dictation I don't have a hemoglobin A1c however she is only on oral agents. She tells me that she noted a blister on her left lateral heel about 3 weeks ago. She brought  this to attention of her primary doctor and was given DuoDERM. Later on her daughter actually removed the surface of this and some surrounding skin. She is in for our review of this. She does not have a history of wounds on her lower extremities. She does not have known PAD. She does have neuropathy in her feet ABIs in this clinic were noncompressible bilaterally 03/30/17-she is here in follow up evaluation for a left heel wound. She is voicing no complaints or concerns. She states she could not wear the surgical shoe. She continues to have maceration although not significantly different. We will switch to [facility] and she'll follow-up next week 04/06/17; patient is here for follow-up evaluation of the left heel wound. She has been  using Prisma. The area is closed this week. READMISSION 03/01/2018. Patient is back in clinic today. She has type 2 diabetes with diabetic neuropathy. She states over the last week and a half she has noted painful areas on her bilateral outer heels. She has been in this clinic before in March 2019 almost a year ago with areas on her left heel. This healed out very easily. ABIs in our clinic have been consistently noncompressible although she is not known to have a significant arterial issue. She is gone to wearing her open heeled bedroom slippers so as not to put any pressure on the heel although a week or so ago she was walking in shoes that may have contributed to the areas in question. 03/15/2018; 3-week follow-up. The right heel looks fine. The left heel deep tissue injury looks improved. Surface epithelium is intact. There is still evidence of subcutaneous damage but this is not evolved into a wound so far 3/25; 3-week follow-up. There is no open wound on either heel. She complains of pain in the right heel when she walks however surface epithelium is intact. Peripheral pulses are palpable READMISSION 11/29/2018 Mrs. Postma is a patient we know fairly well in this clinic for a history of wound difficulties. 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 She was here again in 2019 with a wound on the left lateral heel and then again in February and March of this year with bilateral wounds on her lateral heels. She is a type II diabetic with peripheral neuropathy but she is not felt to have PAD although her ABIs have been previously noncompressible. She tells me that she was hospitalized in August at [facility] in [place] for Covid she was in hospital for 3 weeks. She came out of this with apparently bilateral, lateral heel wounds. For the last 3 or 4 weeks she has had [facility] in the area on the right is actually healed however she has a small remaining open area  on the left. She is not been using shoes with heels in them. We had previously recommended that she get bilateral "bunny boots" for use at night. I do not think she ever did this. Past medical history, type 2 diabetes with peripheral neuropathy, apparently a recent diagnosis of CLL, multiple myeloma, coronary artery disease, hypertension and Covid in August 2020 she is apparently still getting PT and OT. She feels generally very weak but otherwise well 12/2; the patient is here for a small wound on the left lateral heel presumably which is a pressure area occurring while she was in hospital in August. We have been using silver collagen. She laments that she has been religious and offloading this wears bunny  boots at night she does not understand why this is not getting better Vigen, Sabree A. ([id number]) Objective Constitutional Sitting or standing Blood Pressure is within target range for patient.. Pulse regular and within target range for patient.Marland Kitchen Respirations regular, non-labored and within target range.. Temperature is normal and within the target range for the patient.Marland Kitchen appears in no distress. Vitals Time Taken: 10:34 AM, Height: 62 in, Weight: 155 lbs, BMI: 28.3, Temperature: 98.1 F, Pulse: 72 bpm, Respiratory Rate: 16 breaths/min, Blood Pressure: 128/41 mmHg. General Notes: Wound examination there is nothing open on the right heel. On the left lateral heel the superficial area was still intact surface eschar. Using a #3 curette this was removed along with some mild subcutaneous necrotic debris. There is only a small open area with a healthy base that is left of this original small wound post debridement. There is no evidence of surrounding infection Integumentary (Hair, Skin) Wound #5 status is Open. Original cause of wound was Pressure Injury. The wound is located on the Left,Lateral Calcaneus. The wound measures 0.1cm length x 0.1cm width x 0.1cm depth; 0.008cm^2 area and 0.001cm^3  volume. There is Fat Layer (Subcutaneous Tissue) Exposed exposed. There is no tunneling or undermining noted. There is a medium amount of sanguinous drainage noted. The wound margin is flat and intact. There is no granulation within the wound bed. There is a small (1-33%) amount of necrotic tissue within the wound bed including Eschar. 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 There was a Excisional Skin/Subcutaneous Tissue Debridement with a total area of 0.01 sq cm performed by Ricard Dillon, MD. With the following instrument(s): Curette to remove Non-Viable tissue/material. Material removed includes Subcutaneous Tissue and Skin: Epidermis and after achieving pain control using Lidocaine. No specimens were taken. A time out was conducted at 11:30, prior to the start of the procedure. A Minimum amount of bleeding was controlled with Pressure. The procedure was tolerated well with a pain level of 0 throughout and a pain level of 0 following the procedure. Post Debridement Measurements: 0.1cm length x 0.1cm width x 0.1cm depth; 0.001cm^3 volume. Lavalley, Timeka A. ([id number]) Character of Wound/Ulcer Post Debridement is improved. Severity of Tissue Post Debridement is: Fat layer exposed. Post procedure Diagnosis Wound #5: Same as Pre-Procedure Plan Wound Cleansing: Wound #5 Left,Lateral Calcaneus: Dial antibacterial soap, wash wounds, rinse and pat dry prior to dressing wounds Anesthetic (add to Medication List): Wound #5 Left,Lateral Calcaneus: Topical Lidocaine 4% cream applied to wound bed prior to debridement (In Clinic  Only). Primary Wound Dressing: Wound #5 Left,Lateral Calcaneus: Silver Collagen - moistened with hydrogel Secondary Dressing: Wound #5 Left,Lateral Calcaneus: Boardered Foam Dressing Dressing Change Frequency: Wound #5 Left,Lateral Calcaneus: Change Dressing Monday, Wednesday, Friday Follow-up Appointments: Wound #5 Left,Lateral Calcaneus: Return Appointment in 2 weeks. Edema Control: Wound #5 Left,Lateral Calcaneus: Elevate legs to the level of the heart and pump ankles as often as possible Off-Loading: Wound #5 Left,Lateral Calcaneus: Other: - Keep pressure off of heels 1. Silver collagen with border foam to continue. Hopefully healed by next week she is already religiously offloading this area Electronic Signature(s) Signed: 12/13/2018 4:48:24 PM By: [name] MD Entered By: [name] on  12/13/2018 12:22:37 Welby, Silva A. ([id number]) -------------------------------------------------------------------------------- SuperBill Details Patient Name: Mozingo, Thomasenia A. Date of Service: 12/13/2018 Medical Record Number: [id number] Patient Account Number: [id number] Date of Birth/Sex: Jun 28, 1940 (78 y.o. F) Treating RN: Harold Barban Primary Care Provider: Lavera [name] Other Clinician: Referring Provider: Lavera [name] Treating Provider/Extender: Tito Dine in Treatment: 2 Diagnosis Coding ICD-10 Codes Code Description E11.621 Type 2 diabetes mellitus with foot ulcer E11.42 Type 2 diabetes mellitus with diabetic polyneuropathy L97.421 Non-pressure chronic ulcer of left heel and midfoot limited to breakdown of skin Facility Procedures CPT4 Code Description: [id number] 11042 - DEB SUBQ TISSUE 20 SQ CM/< ICD-10 Diagnosis Description L97.421 Non-pressure chronic ulcer of left heel and midfoot limited t Modifier: o breakdown of s Quantity: 1 kin Physician Procedures CPT4 Code Description: [id number] 56720 - WC PHYS SUBQ TISS 20 SQ CM ICD-10 Diagnosis Description  L97.421 Non-pressure chronic ulcer of left heel and midfoot limited t Modifier: o breakdown of sk Quantity: 1 in Electronic Signature(s) Signed: 12/13/2018 4:48:24 PM By: [name] MD Entered By: [name] on 12/13/2018 12:26:58

## 2018-12-13 NOTE — Progress Notes (Signed)
HOLDEN, HUESTON ([id number]) Visit Report for 12/13/2018 Arrival Information Details Patient Name: [name], [name] Date of Service: 12/13/2018 10:30 AM Medical Record Number: [id number] Patient Account Number: [id number] Date of Birth/Sex: [birth year] (78 y.o. F) Treating RN: Army Melia Primary Care Meckenzie Balsley: Lavera [name] Other Clinician: Referring Tyna Huertas: Lavera [name] Treating Rhen Kawecki/Extender: Tito Dine in Treatment: 2 Visit Information History Since Last Visit Added or deleted any medications: No Patient Arrived: Walker Any new allergies or adverse reactions: No Arrival Time: 10:33 Had [name] or experienced change in No Accompanied By: self activities of daily living that may affect Transfer Assistance: None risk of falls: Patient Identification Verified: Yes Signs or symptoms of abuse/neglect since last visito No Hospitalized since last visit: No Has Dressing in Place as Prescribed: Yes Pain Present Now: No Electronic Signature(s) Signed: 12/13/2018 11:52:14 AM By: Army Melia Entered By: Army Melia on 12/13/2018 10:33:59 [name], [name] ([id number]) -------------------------------------------------------------------------------- Encounter Discharge Information Details Patient Name: [name], [name] Date of Service: 12/13/2018 10:30 AM Medical Record Number: [id number] Patient Account Number: [id number] Date of Birth/Sex: 10-08-1940 (78 y.o. F) Treating RN: Harold Barban Primary Care Willodene Stallings: Lavera [name] Other Clinician: Referring Milly Goggins: Lavera [name] Treating Frantz Quattrone/Extender: Tito Dine in Treatment: 2 Encounter Discharge Information Items Post Procedure Vitals Discharge Condition: Stable Temperature (F): 98.1 Ambulatory Status: Walker Pulse (bpm): 72 Discharge Destination: Home Respiratory Rate (breaths/min): 18 Transportation: Private Auto Blood Pressure (mmHg): 128/41 Accompanied By: self Schedule Follow-up Appointment:  Yes Clinical Summary of Care: Electronic Signature(s) Signed: 12/13/2018 4:10:47 PM By: Harold Barban Entered By: Harold Barban on 12/13/2018 11:36:47 Bedonie, [name] ([id number]) -------------------------------------------------------------------------------- Lower Extremity Assessment Details Patient Name: [name], [name] Date of Service: 12/13/2018 10:30 AM Medical Record Number: [id number] Patient Account Number: [id number] Date of Birth/Sex: [birth year] (78 y.o. F) Treating RN: Army Melia Primary Care Brya Simerly: Lavera [name] Other Clinician: Referring Chaitra Mast: Lavera [name] Treating Tyshan Enderle/Extender: Ricard Dillon Weeks in Treatment: 2 Edema Assessment Assessed: [Left: No] [Right: No] Edema: [Left: N] [Right: o] Vascular Assessment Pulses: Dorsalis Pedis Palpable: [Left:Yes] Electronic Signature(s) Signed: 12/13/2018 11:52:14 AM By: Army Melia Entered By: Army Melia on 12/13/2018 10:35:45 [name], [name] ([id number]) -------------------------------------------------------------------------------- Multi Wound Chart Details Patient Name: [name], [name] Date of Service: 12/13/2018 10:30 AM Medical Record Number: [id number] Patient Account Number: [id number] Date of Birth/Sex: 03-05-1940 (78 y.o. F) Treating RN: Harold Barban Primary Care Cortez Steelman: Lavera [name] Other Clinician: Referring Nehemias Sauceda: Lavera [name] Treating Humberto Addo/Extender: Tito Dine in Treatment: 2 Vital Signs Height(in): 62 Pulse(bpm): 72 Weight(lbs): 155 Blood Pressure(mmHg): 128/41 Body Mass Index(BMI): 28 Temperature(F): 98.1 Respiratory Rate 16 (breaths/min): Photos: [N/[name]:N/[name]] Wound Location: Left Calcaneus - Lateral N/[name] N/[name] Wounding Event: Pressure Injury N/[name] N/[name] Primary Etiology: Diabetic Wound/Ulcer of the N/[name] N/[name] Lower Extremity Comorbid History: Cataracts, Anemia, N/[name] N/[name] Congestive Heart Failure, Coronary Artery Disease, Hypertension, Type II Diabetes,  Osteoarthritis, Neuropathy, Received Chemotherapy, Received Radiation Date Acquired: 08/14/2018 N/[name] N/[name] Weeks of Treatment: 2 N/[name] N/[name] Wound Status: Open N/[name] N/[name] Measurements L x W x D 0.1x0.1x0.1 N/[name] N/[name] (cm) Area (cm) : 0.008 N/[name] N/[name] Volume (cm) : 0.001 N/[name] N/[name] % Reduction in Area: 93.20% N/[name] N/[name] % Reduction in Volume: 91.70% N/[name] N/[name] Classification: Grade 1 N/[name] N/[name] Exudate Amount: Medium N/[name] N/[name] Exudate Type: Sanguinous N/[name] N/[name] Exudate Color: red N/[name] N/[name] Wound Margin: Flat and Intact N/[name] N/[name] Granulation Amount: None Present (0%) N/[name] N/[name] Necrotic Amount: Small (1-33%) N/[name] N/[name] Necrotic Tissue: Eschar N/[name] N/[name] [name], [name] ([id number]) Exposed  Structures: Fat Layer (Subcutaneous N/[name] N/[name] Tissue) Exposed: Yes Fascia: No Tendon: No Muscle: No Joint: No Bone: No Epithelialization: None N/[name] N/[name] Debridement: Debridement - Selective/Open N/[name] N/[name] Wound Pre-procedure 11:30 N/[name] N/[name] Verification/Time Out Taken: Pain Control: Lidocaine N/[name] N/[name] Level: Skin/Epidermis N/[name] N/[name] Debridement Area (sq cm): 0.01 N/[name] N/[name] Instrument: Curette N/[name] N/[name] Bleeding: Minimum N/[name] N/[name] Hemostasis Achieved: Pressure N/[name] N/[name] Procedural Pain: 0 N/[name] N/[name] Post Procedural Pain: 0 N/[name] N/[name] Debridement Treatment Procedure was tolerated well N/[name] N/[name] Response: Post Debridement 0.1x0.1x0.1 N/[name] N/[name] Measurements L x W x D (cm) Post Debridement Volume: 0.001 N/[name] N/[name] (cm) Procedures Performed: Debridement N/[name] N/[name] Treatment Notes Wound #5 (Left, Lateral Calcaneus) Notes Prisma Ag, Hydrogel and border foam dressing Electronic Signature(s) Signed: 12/13/2018 4:48:24 PM By: [name] MD Entered By: [name] on 12/13/2018 12:18:05 [name], [name] [name] ([id number]) -------------------------------------------------------------------------------- Bee Plan Details Patient Name: [name], [name] Date of Service: 12/13/2018 10:30 AM Medical Record Number: [id number] Patient Account Number:  [id number] Date of Birth/Sex: 04-08-1940 (78 y.o. F) Treating RN: Harold Barban Primary Care Neta Upadhyay: Lavera [name] Other Clinician: Referring Angelis Gates: Lavera [name] Treating Hermela Hardt/Extender: Tito Dine in Treatment: 2 Active Inactive Orientation to the Wound Care Program Nursing Diagnoses: Knowledge deficit related to the wound healing center program Goals: Patient/caregiver will verbalize understanding of the [facility] Date Initiated: 11/29/2018 Target Resolution Date: 12/06/2018 Goal Status: Active Interventions: Provide education on orientation to the wound center Notes: Peripheral Neuropathy Nursing Diagnoses: Knowledge deficit related to disease process and management of peripheral neurovascular dysfunction Potential alteration in peripheral tissue perfusion (select prior to confirmation of diagnosis) Goals: Patient/caregiver will verbalize understanding of disease process and disease management Date Initiated: 11/29/2018 Target Resolution Date: 12/06/2018 Goal Status: Active Interventions: Assess signs and symptoms of neuropathy upon admission and as needed Notes: Pressure Nursing Diagnoses: Knowledge deficit related to management of pressures ulcers Goals: Patient will remain free of pressure ulcers Date Initiated: 11/29/2018 Target Resolution Date: 12/06/2018 Goal Status: Active Interventions: Eduardo, Tamicka [name]. ([id number]) Provide education on pressure ulcers Notes: Electronic Signature(s) Signed: 12/13/2018 4:10:47 PM By: Harold Barban Entered By: Harold Barban on 12/13/2018 11:29:45 [name], [name] ([id number]) -------------------------------------------------------------------------------- Pain Assessment Details Patient Name: Deller, Sherlie [name]. Date of Service: 12/13/2018 10:30 AM Medical Record Number: [id number] Patient Account Number: [id number] Date of Birth/Sex: 08-29-1940 (78 y.o. F) Treating RN: Army Melia Primary  Care Melroy Bougher: Lavera [name] Other Clinician: Referring Bardia Wangerin: Lavera [name] Treating Javonn Gauger/Extender: Tito Dine in Treatment: 2 Active Problems Location of Pain Severity and Description of Pain Patient Has Paino No Site Locations Pain Management and Medication Current Pain Management: Electronic Signature(s) Signed: 12/13/2018 11:52:14 AM By: Army Melia Entered By: Army Melia on 12/13/2018 10:34:05 [name], [name] ([id number]) -------------------------------------------------------------------------------- Patient/Caregiver Education Details Patient Name: Spanier, Zainah [name]. Date of Service: 12/13/2018 10:30 AM Medical Record Number: [id number] Patient Account Number: [id number] Date of Birth/Gender: Jul 07, 1940 (78 y.o. F) Treating RN: Harold Barban Primary Care Physician: Lavera [name] Other Clinician: Referring Physician: Lavera [name] Treating Physician/Extender: Tito Dine in Treatment: 2 Education Assessment Education Provided To: Patient Education Topics Provided Pressure: Handouts: Pressure Ulcers: Care and Offloading Methods: Demonstration, Explain/Verbal Responses: State content correctly Electronic Signature(s) Signed: 12/13/2018 4:10:47 PM By: Harold Barban Entered By: Harold Barban on 12/13/2018 11:30:21 [name], [name] Shore [name]. ([id number]) -------------------------------------------------------------------------------- Wound Assessment Details Patient Name: [name], [name] Date of Service: 12/13/2018 10:30 AM Medical Record Number: [id number] Patient Account Number: [id number] Date of Birth/Sex: 1940/08/23 (78 y.o. F) Treating  RN: Army Melia Primary Care Izayiah Tibbitts: Lavera [name] Other Clinician: Referring Tajee Savant: Lavera [name] Treating Zita Ozimek/Extender: Tito Dine in Treatment: 2 Wound Status Wound Number: 5 Primary Diabetic Wound/Ulcer of the Lower Extremity Etiology: Wound Location: Left Calcaneus - Lateral Wound  Open Wounding Event: Pressure Injury Status: Date Acquired: 08/14/2018 Comorbid Cataracts, Anemia, Congestive Heart Failure, Weeks Of Treatment: 2 History: Coronary Artery Disease, Hypertension, Type II Clustered Wound: No Diabetes, Osteoarthritis, Neuropathy, Received Chemotherapy, Received Radiation Photos Wound Measurements Length: (cm) 0.1 Width: (cm) 0.1 Depth: (cm) 0.1 Area: (cm) 0.008 Volume: (cm) 0.001 % Reduction in Area: 93.2% % Reduction in Volume: 91.7% Epithelialization: None Tunneling: No Undermining: No Wound Description Classification: Grade 1 Foul Odor Wound Margin: Flat and Intact Slough/Fib Exudate Amount: Medium Exudate Type: Sanguinous Exudate Color: red After Cleansing: No rino No Wound Bed Granulation Amount: None Present (0%) Exposed Structure Necrotic Amount: Small (1-33%) Fascia Exposed: No Necrotic Quality: Eschar Fat Layer (Subcutaneous Tissue) Exposed: Yes Tendon Exposed: No Muscle Exposed: No Joint Exposed: No Bone Exposed: No [name], [name] ([id number]) Treatment Notes Wound #5 (Left, Lateral Calcaneus) Notes Prisma Ag, Hydrogel and border foam dressing Electronic Signature(s) Signed: 12/13/2018 11:52:14 AM By: Army Melia Signed: 12/13/2018 4:13:23 PM By: Montey Hora Entered By: Montey Hora on 12/13/2018 11:40:14 Keitt, [name] ([id number]) -------------------------------------------------------------------------------- Vitals Details Patient Name: Marchuk, Linzy [name]. Date of Service: 12/13/2018 10:30 AM Medical Record Number: [id number] Patient Account Number: [id number] Date of Birth/Sex: February 15, 1940 (78 y.o. F) Treating RN: Army Melia Primary Care Christyana Corwin: Lavera [name] Other Clinician: Referring Giuseppina Quinones: Lavera [name] Treating Madison Direnzo/Extender: Tito Dine in Treatment: 2 Vital Signs Time Taken: 10:34 Temperature (F): 98.1 Height (in): 62 Pulse (bpm): 72 Weight (lbs): 155 Respiratory Rate (breaths/min):  16 Body Mass Index (BMI): 28.3 Blood Pressure (mmHg): 128/41 Reference Range: 80 - 120 mg / dl Electronic Signature(s) Signed: 12/13/2018 11:52:14 AM By: Army Melia Entered By: Army Melia on 12/13/2018 10:35:04

## 2018-12-27 ENCOUNTER — Ambulatory Visit: Payer: Medicare Other | Admitting: Internal Medicine

## 2019-01-03 ENCOUNTER — Other Ambulatory Visit: Payer: Self-pay

## 2019-01-03 ENCOUNTER — Encounter: Payer: Medicare Other | Admitting: Internal Medicine

## 2019-01-03 DIAGNOSIS — E11621 Type 2 diabetes mellitus with foot ulcer: Secondary | ICD-10-CM | POA: Diagnosis not present

## 2019-01-17 NOTE — Progress Notes (Signed)
DEJON, KASSEBAUM ([id number]) Visit Report for 01/03/2019 Arrival Information Details Patient Name: Zentner, Opaline A. Date of Service: 01/03/2019 2:30 PM Medical Record Number: [id number] Patient Account Number: [id number] Date of Birth/Sex: 1940/07/12 (79 y.o. F) Treating RN: Cornell Barman Primary Care Raphaela Cannaday: Lavera [name] Other Clinician: Referring Trissa Molina: Lavera [name] Treating Shantele Reller/Extender: Tito Dine in Treatment: 5 Visit Information History Since Last Visit Added or deleted any medications: No Patient Arrived: Walker Any new allergies or adverse reactions: No Arrival Time: 15:10 Had a fall or experienced change in No Accompanied By: self activities of daily living that may affect Transfer Assistance: None risk of falls: Patient Identification Verified: Yes Signs or symptoms of abuse/neglect since last visito No Secondary Verification Process Completed: Yes Hospitalized since last visit: No Implantable device outside of the clinic excluding No cellular tissue based products placed in the center since last visit: Has Dressing in Place as Prescribed: Yes Pain Present Now: No Electronic Signature(s) Signed: 01/03/2019 4:03:03 PM By: Lorine Bears RCP, RRT, CHT Entered By: Lorine Bears on 01/03/2019 15:11:51 Calixte, Zaelyn A. ([id number]) -------------------------------------------------------------------------------- Clinic Level of Care Assessment Details Patient Name: Neer, Magdaline A. Date of Service: 01/03/2019 2:30 PM Medical Record Number: [id number] Patient Account Number: [id number] Date of Birth/Sex: 1940/03/12 (79 y.o. F) Treating RN: Cornell Barman Primary Care Kileen Lange: Lavera [name] Other Clinician: Referring Benjermin Korber: Lavera [name] Treating Rhiana Morash/Extender: Tito Dine in Treatment: 5 Clinic Level of Care Assessment Items TOOL 4 Quantity Score []  - Use when only an EandM is performed on FOLLOW-UP visit  0 ASSESSMENTS - Nursing Assessment / Reassessment []  - Reassessment of Co-morbidities (includes updates in patient status) 0 X- 1 5 Reassessment of Adherence to Treatment Plan ASSESSMENTS - Wound and Skin Assessment / Reassessment X - Simple Wound Assessment / Reassessment - one wound 1 5 []  - 0 Complex Wound Assessment / Reassessment - multiple wounds []  - 0 Dermatologic / Skin Assessment (not related to wound area) ASSESSMENTS - Focused Assessment []  - Circumferential Edema Measurements - multi extremities 0 []  - 0 Nutritional Assessment / Counseling / Intervention []  - 0 Lower Extremity Assessment (monofilament, tuning fork, pulses) []  - 0 Peripheral Arterial Disease Assessment (using hand held doppler) ASSESSMENTS - Ostomy and/or Continence Assessment and Care []  - Incontinence Assessment and Management 0 []  - 0 Ostomy Care Assessment and Management (repouching, etc.) PROCESS - Coordination of Care X - Simple Patient / Family Education for ongoing care 1 15 []  - 0 Complex (extensive) Patient / Family Education for ongoing care []  - 0 Staff obtains Programmer, systems, Records, Test Results / Process Orders []  - 0 Staff telephones HHA, Nursing Homes / Clarify orders / etc []  - 0 Routine Transfer to another Facility (non-emergent condition) []  - 0 Routine Hospital Admission (non-emergent condition) []  - 0 New Admissions / Biomedical engineer / Ordering NPWT, Apligraf, etc. []  - 0 Emergency Hospital Admission (emergent condition) X- 1 10 Simple Discharge Coordination Stokes, Victorya A. ([id number]) []  - 0 Complex (extensive) Discharge Coordination PROCESS - Special Needs []  - Pediatric / Minor Patient Management 0 []  - 0 Isolation Patient Management []  - 0 Hearing / Language / Visual special needs []  - 0 Assessment of Community assistance (transportation, D/C planning, etc.) []  - 0 Additional assistance / Altered mentation []  - 0 Support Surface(s) Assessment (bed,  cushion, seat, etc.) INTERVENTIONS - Wound Cleansing / Measurement []  - Simple Wound Cleansing - one wound 0 []  - 0 Complex Wound Cleansing - multiple wounds []  -  0 Wound Imaging (photographs - any number of wounds) []  - 0 Wound Tracing (instead of photographs) []  - 0 Simple Wound Measurement - one wound []  - 0 Complex Wound Measurement - multiple wounds INTERVENTIONS - Wound Dressings X - Small Wound Dressing one or multiple wounds 1 10 []  - 0 Medium Wound Dressing one or multiple wounds []  - 0 Large Wound Dressing one or multiple wounds []  - 0 Application of Medications - topical []  - 0 Application of Medications - injection INTERVENTIONS - Miscellaneous []  - External ear exam 0 []  - 0 Specimen Collection (cultures, biopsies, blood, body fluids, etc.) []  - 0 Specimen(s) / Culture(s) sent or taken to Lab for analysis []  - 0 Patient Transfer (multiple staff / Civil Service fast streamer / Similar devices) []  - 0 Simple Staple / Suture removal (25 or less) []  - 0 Complex Staple / Suture removal (26 or more) []  - 0 Hypo / Hyperglycemic Management (close monitor of Blood Glucose) []  - 0 Ankle / Brachial Index (ABI) - do not check if billed separately X- 1 5 Vital Signs Remley, Merina A. ([id number]) Has the patient been seen at the hospital within the last three years: Yes Total Score: 50 Level Of Care: New/Established - Level 2 Electronic Signature(s) Signed: 01/17/2019 4:51:46 PM By: Gretta Cool, BSN, RN, CWS, Kim RN, BSN Entered By: Gretta Cool, BSN, RN, CWS, Kim on 01/03/2019 15:53:04 Demonte, [name] ([id number]) -------------------------------------------------------------------------------- Encounter Discharge Information Details Patient Name: Kiedrowski, Verneal A. Date of Service: 01/03/2019 2:30 PM Medical Record Number: [id number] Patient Account Number: [id number] Date of Birth/Sex: [birth year] (79 y.o. F) Treating RN: Cornell Barman Primary Care Aziya Arena: Lavera [name] Other Clinician: Referring  Eligh Rybacki: Lavera [name] Treating Sussie Minor/Extender: Tito Dine in Treatment: 5 Encounter Discharge Information Items Discharge Condition: Stable Ambulatory Status: Walker Discharge Destination: Home Transportation: Private Auto Accompanied By: self Schedule Follow-up Appointment: Yes Clinical Summary of Care: Electronic Signature(s) Signed: 01/17/2019 4:51:46 PM By: Gretta Cool, BSN, RN, CWS, Kim RN, BSN Entered By: Gretta Cool, BSN, RN, CWS, Kim on 01/03/2019 15:54:27 Rennels, [name] ([id number]) -------------------------------------------------------------------------------- Lower Extremity Assessment Details Patient Name: Raney, Aevah A. Date of Service: 01/03/2019 2:30 PM Medical Record Number: [id number] Patient Account Number: [id number] Date of Birth/Sex: 1940-12-28 (79 y.o. F) Treating RN: Army Melia Primary Care Keandre Linden: Lavera [name] Other Clinician: Referring Joshlynn Alfonzo: Lavera [name] Treating Bernon Arviso/Extender: Ricard Dillon Weeks in Treatment: 5 Edema Assessment Assessed: [Left: No] [Right: No] Edema: [Left: N] [Right: o] Vascular Assessment Pulses: Dorsalis Pedis Palpable: [Right:Yes] Electronic Signature(s) Signed: 01/03/2019 4:11:28 PM By: Army Melia Entered By: Army Melia on 01/03/2019 15:16:17 Fricke, Senaya A. ([id number]) -------------------------------------------------------------------------------- Multi Wound Chart Details Patient Name: Langille, Leanza A. Date of Service: 01/03/2019 2:30 PM Medical Record Number: [id number] Patient Account Number: [id number] Date of Birth/Sex: 1940-06-22 (79 y.o. F) Treating RN: Cornell Barman Primary Care Lavarr President: Lavera [name] Other Clinician: Referring Ciarah Peace: Lavera [name] Treating Elon Eoff/Extender: Tito Dine in Treatment: 5 Vital Signs Height(in): 62 Pulse(bpm): 33 Weight(lbs): 155 Blood Pressure(mmHg): 133/47 Body Mass Index(BMI): 28 Temperature(F): 98.1 Respiratory  Rate 16 (breaths/min): Photos: [N/A:N/A] Wound Location: Left, Lateral Calcaneus N/A N/A Wounding Event: Pressure Injury N/A N/A Primary Etiology: Diabetic Wound/Ulcer of the N/A N/A Lower Extremity Comorbid History: Cataracts, Anemia, N/A N/A Congestive Heart Failure, Coronary Artery Disease, Hypertension, Type II Diabetes, Osteoarthritis, Neuropathy, Received Chemotherapy, Received Radiation Date Acquired: 08/14/2018 N/A N/A Weeks of Treatment: 5 N/A N/A Wound Status: Healed - Epithelialized N/A N/A Measurements L x W x D 0x0x0 N/A N/A (cm) Area (  cm) : 0 N/A N/A Volume (cm) : 0 N/A N/A % Reduction in Area: 100.00% N/A N/A % Reduction in Volume: 100.00% N/A N/A Classification: Grade 1 N/A N/A Exudate Amount: Medium N/A N/A Exudate Type: Sanguinous N/A N/A Exudate Color: red N/A N/A Wound Margin: Flat and Intact N/A N/A Granulation Amount: None Present (0%) N/A N/A Necrotic Amount: Small (1-33%) N/A N/A Necrotic Tissue: Eschar N/A N/A Elster, Camyra A. ([id number]) Exposed Structures: Fat Layer (Subcutaneous N/A N/A Tissue) Exposed: Yes Fascia: No Tendon: No Muscle: No Joint: No Bone: No Epithelialization: None N/A N/A Treatment Notes Electronic Signature(s) Signed: 01/03/2019 5:48:31 PM By: [name] MD Entered By: [name] on 01/03/2019 17:13:28 Hall, Sherrice AMarland Kitchen ([id number]) -------------------------------------------------------------------------------- Multi-Disciplinary Care Plan Details Patient Name: Bertoni, Matalyn A. Date of Service: 01/03/2019 2:30 PM Medical Record Number: [id number] Patient Account Number: [id number] Date of Birth/Sex: [birth year] (79 y.o. F) Treating RN: Cornell Barman Primary Care Harla Mensch: Lavera [name] Other Clinician: Referring Taher Vannote: Lavera [name] Treating Merilee Wible/Extender: Tito Dine in Treatment: 5 Active Inactive Electronic Signature(s) Signed: 01/17/2019 4:51:46 PM By: Gretta Cool, BSN, RN, CWS, Kim RN, BSN Entered  By: Gretta Cool, BSN, RN, CWS, Kim on 01/03/2019 15:49:35 Basey, [name] ([id number]) -------------------------------------------------------------------------------- Pain Assessment Details Patient Name: Gladu, Gale A. Date of Service: 01/03/2019 2:30 PM Medical Record Number: [id number] Patient Account Number: [id number] Date of Birth/Sex: 1940-09-12 (79 y.o. F) Treating RN: Cornell Barman Primary Care Tariya Morrissette: Lavera [name] Other Clinician: Referring Madge Therrien: Lavera [name] Treating Blia Totman/Extender: Tito Dine in Treatment: 5 Active Problems Location of Pain Severity and Description of Pain Patient Has Paino No Site Locations Pain Management and Medication Current Pain Management: Electronic Signature(s) Signed: 01/03/2019 4:03:03 PM By: Paulla Fore, RRT, CHT Signed: 01/17/2019 4:51:46 PM By: Gretta Cool, BSN, RN, CWS, Kim RN, BSN Entered By: Lorine Bears on 01/03/2019 15:12:01 Strehle, Jaylen AMarland Kitchen ([id number]) -------------------------------------------------------------------------------- Wound Assessment Details Patient Name: Koffler, Kori A. Date of Service: 01/03/2019 2:30 PM Medical Record Number: [id number] Patient Account Number: [id number] Date of Birth/Sex: 1940/12/10 (79 y.o. F) Treating RN: Cornell Barman Primary Care Luceil Herrin: Lavera [name] Other Clinician: Referring Arik Husmann: Lavera [name] Treating Yasser Hepp/Extender: Tito Dine in Treatment: 5 Wound Status Wound Number: 5 Primary Diabetic Wound/Ulcer of the Lower Extremity Etiology: Wound Location: Left, Lateral Calcaneus Wound Healed - Epithelialized Wounding Event: Pressure Injury Status: Date Acquired: 08/14/2018 Comorbid Cataracts, Anemia, Congestive Heart Failure, Weeks Of Treatment: 5 History: Coronary Artery Disease, Hypertension, Type II Clustered Wound: No Diabetes, Osteoarthritis, Neuropathy, Received Chemotherapy, Received Radiation Photos Wound  Measurements Length: (cm) 0 Width: (cm) 0 Depth: (cm) 0 Area: (cm) 0 Volume: (cm) 0 % Reduction in Area: 100% % Reduction in Volume: 100% Epithelialization: None Tunneling: No Undermining: No Wound Description Classification: Grade 1 Foul Od Wound Margin: Flat and Intact Slough/ Exudate Amount: Medium Exudate Type: Sanguinous Exudate Color: red or After Cleansing: No Fibrino No Wound Bed Granulation Amount: None Present (0%) Exposed Structure Necrotic Amount: Small (1-33%) Fascia Exposed: No Necrotic Quality: Eschar Fat Layer (Subcutaneous Tissue) Exposed: Yes Tendon Exposed: No Muscle Exposed: No Joint Exposed: No Bone Exposed: No Kinter, Marice A. ([id number]) Electronic Signature(s) Signed: 01/17/2019 4:51:46 PM By: Gretta Cool, BSN, RN, CWS, Kim RN, BSN Entered By: Gretta Cool, BSN, RN, CWS, Kim on 01/03/2019 15:49:20 Schlie, Sherrice A. ([id number]) -------------------------------------------------------------------------------- [place] Details Patient Name: Moulder, Mikeala A. Date of Service: 01/03/2019 2:30 PM Medical Record Number: [id number] Patient Account Number: [id number] Date of Birth/Sex: 05-Oct-1940 (79 y.o. F) Treating RN: Cornell Barman Primary Care Shiloh Southern:  BLISS, LAURA Other Clinician: Referring Seve Monette: Lavera [name] Treating Kriya Westra/Extender: Ricard Dillon Weeks in Treatment: 5 Vital Signs Time Taken: 15:10 Temperature (F): 98.1 Height (in): 62 Pulse (bpm): 73 Weight (lbs): 155 Respiratory Rate (breaths/min): 16 Body Mass Index (BMI): 28.3 Blood Pressure (mmHg): 133/47 Reference Range: 80 - 120 mg / dl Electronic Signature(s) Signed: 01/03/2019 4:03:03 PM By: Lorine Bears RCP, RRT, CHT Entered By: Lorine Bears on 01/03/2019 15:12:29

## 2019-01-17 NOTE — Progress Notes (Signed)
[name], [name] ([id number]) Visit Report for 01/03/2019 HPI Details Patient Name: [name], [name] Date of Service: 01/03/2019 2:30 PM Medical Record Number: [id number] Patient Account Number: [id number] Date of Birth/Sex: 03-19-1940 (79 y.o. F) Treating RN: Cornell Barman Primary Care Provider: Lavera [name] Other Clinician: Referring Provider: Lavera [name] Treating Provider/Extender: Tito Dine in Treatment: 5 History of Present Illness HPI Description: 12/24/15; Mrs. Dimario is a 79 year old woman who is the mother of one of our other clinic patient's Tammy Blundell. He comes [place] today for review a nonhealing surgical CABG wound. The patient underwent CABG o2 on October 31 17 at [facility]. She apparently had had a myocardial infarction in October but briefly presented with shortness of breath. She underwent surgery which I understand was uneventful. According to the patient her surgical incision never really healed in 2 spots. One is at the inferior recess question drain site, the other more superiorly about an inch from the superior surface of this it incision. She has been followed by her surgeon at [facility] who felt that both areas which he'll according to the patient. She was put on Augmentin 2 weeks ago and she is finished a course of this culture at that time showed Escherichia coli which should've been sensitive. The patient also relates that she has a history of allergy to ampicillin. Besides this the patient has a history of type 2 diabetes on metformin, multiple myeloma. She had a chest x-ray in mid October the did not show any abnormalities. I cannot see that she had any specific views of the sternum however. We have her referral note from her primary physician Dr. Bernie Covey. She was concerned about erythema around the wound and as mentioned the culture here showed Escherichia coli which should have been Augmentin sensitive but was resistant to ampicillin, Keflex  third generation cephalosporins quinolones and sulfonamides. [ESBL). The patient does not complain of fever or chills excessive pain. She does say that the lower surgical wound has been draining what looks to her like pus. She has been using triple antibiotic cream. 12/31/15 superior wound actually looks somewhat better. Inferiorly still a wound with some depth greater than 2 cm. Using Aquacel Ag. Culture I did of the drainage from the inferior wound and this surgical incision last week was negative 01/07/16; superior wound still requires debridement of surface eschar and nonviable subcutaneous tissue. Inferior wound measured at 1.8 cm. What I can see of the tissue here does not look very healthy. Original culture that I did on her presentation was negative however 01/27/16; the superior wound is closed she still has a deep probing wound of roughly 2 cm inferiorly. There is no overt infection no surrounding erythema. The patient tells me that 2 weeks ago she was developing chest pain and underwent a nuclear stress test. This was apparently negative however she developed chest pain shortly thereafter and had to go have a cardiac catheterization that did not show blockages. Since then she is not having chest pain but has unrelenting nausea making it difficult for her to eat. We reviewed her medications and she is on aspirin and Plavix however she apparently has a stent in place as well. 02/03/16; still using silver alginate. Depth of this is down from 1.8 cm the circumference is smaller and the depth down by 2 mm red no drainage and no surrounding infection 02/10/16; still using silver alginate depth down to 1.3 cm today measured by myself. Appears to be making slow progress. There  is no surrounding tenderness.. The patient states she has unrelenting nausea which is nonexertional. Not related to meals. She has had no vomiting. She also tells me she is had previous lap band surgery but does not give  clear reflux type symptoms 02/25/16; the wound orifice of this lady's wound on her chest is down to 1 cm in depth and much smaller in terms of the overall diameter. She tells me that she was packing the wound recently and developed quite a bit of bleeding that took several hours to stop she is on Plavix and aspirin. She is not having any exertional symptoms that sound like coronary artery disease. She is complaining of unrelenting nausea but she is being evaluated by GI for this and apparently has an endoscopy planned for March. We have been using iodoform packing 03/03/16; small wound in terms of orifice but measures 1.2 cm in depth, not much different from last time. She still complains of nausea and has an upper GI series books she is not describing exertional chest pain, shortness of breath, nausea or fatigue. 03/16/16; I follow this patient for a nonhealing surgical wound at a site of a previous CABG scar. She developed cellulitis in the incision postoperatively. Today she arrives with the wound orifice to miniscule to even consider measuring a depth. She came Kroeze, Avannah A. ([id number]) in with a Band-Aid over this, she could not get any of the iodoform packing in the increasingly small orifice 03/31/16; the patient's nonhealing surgical wound at the site of her previous CABG scar/inferior aspect is totally epithelialized. She tells me there was some bleeding out of this which was minimal last week however with careful inspection there is no evidence of any open here everything looks healthy and epithelialize. Surrounding tissue also looks normal the patient arrives today with erythema and a significant blister mostly involving the dorsal aspect of her right fifth finger. There were 2 small almost looked like puncture wounds on the anterior aspect of the DIP joint and the medial aspect of the finger. The area was tender and warm. There was no involvement of any joint 04/20/16; The patient returns  today for one last followup. Her original chest wound has remained closed which is fortunate, She came in last time with a right 5th finger blister and erythema. she did not tolerate the aquacel AG. C+S I did was negative. since I last saw her she has a skin tear on the right lateral elbow while doing a medical test READMISSION 03/23/17;Mrs. Cott is a now 79 year old woman that we cared for in this clinic about a year ago with nonhealing wounds at the site of her CABG. She has a type II diabetic with diabetic neuropathy. At the point of this dictation I don't have a hemoglobin A1c however she is only on oral agents. She tells me that she noted a blister on her left lateral heel about 3 weeks ago. She brought this to attention of her primary doctor and was given DuoDERM. Later on her daughter actually removed the surface of this and some surrounding skin. She is in for our review of this. She does not have a history of wounds on her lower extremities. She does not have known PAD. She does have neuropathy in her feet ABIs in this clinic were noncompressible bilaterally 03/30/17-she is here in follow up evaluation for a left heel wound. She is voicing no complaints or concerns. She states she could not wear the surgical shoe. She continues to have  maceration although not significantly different. We will switch to [facility] and she'll follow-up next week 04/06/17; patient is here for follow-up evaluation of the left heel wound. She has been using Prisma. The area is closed this week. READMISSION 03/01/2018. Patient is back in clinic today. She has type 2 diabetes with diabetic neuropathy. She states over the last week and a half she has noted painful areas on her bilateral outer heels. She has been in this clinic before in March 2019 almost a year ago with areas on her left heel. This healed out very easily. ABIs in our clinic have been consistently noncompressible although she is not known to have a  significant arterial issue. She is gone to wearing her open heeled bedroom slippers so as not to put any pressure on the heel although a week or so ago she was walking in shoes that may have contributed to the areas in question. 03/15/2018; 3-week follow-up. The right heel looks fine. The left heel deep tissue injury looks improved. Surface epithelium is intact. There is still evidence of subcutaneous damage but this is not evolved into a wound so far 3/25; 3-week follow-up. There is no open wound on either heel. She complains of pain in the right heel when she walks however surface epithelium is intact. Peripheral pulses are palpable READMISSION 11/29/2018 Mrs. Porco is a patient we know fairly well in this clinic for a history of wound difficulties. 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 She was here again in 2019 with a wound on the left lateral heel and then again in February and March of this year with bilateral wounds on her lateral heels. She is a type II diabetic with peripheral neuropathy but she is not felt to have PAD although her ABIs have been previously noncompressible. She tells me that she was hospitalized in August at [facility] in [place] for Covid she was in hospital for 3 weeks. She came out of this with apparently bilateral, lateral heel wounds. For the last 3 or 4 weeks she has had [facility] in the area on the right is actually healed however she has a small remaining open area on the left. She is not been using shoes with heels in them. We had previously recommended that she get bilateral "bunny boots" for use at night. I do not think she ever did this. Past medical history, type 2 diabetes with peripheral neuropathy, apparently a recent diagnosis of CLL, multiple myeloma, coronary artery disease, hypertension and Covid in August 2020 she is apparently still getting PT and OT. She feels generally very weak but otherwise well 12/2; the  patient is here for a small wound on the left lateral heel presumably which is a pressure area occurring while she was in hospital in August. We have been using silver collagen. She laments that she has been religious and offloading this wears bunny boots at night she does not understand why this is not getting better [name], [name] ([id number]) 12/23; the area on the left lateral heel appears closed. She is inverted at the ankle and I think this has some role in how this happened in the first place. This was a pressure ulcer that occurred while she was in hospital in August Electronic Signature(s) Signed: 01/03/2019 5:48:31 PM By: [name] MD Entered By: [name] on 01/03/2019 17:14:26 [name], [name] ([id number]) -------------------------------------------------------------------------------- Physical Exam Details Patient Name: Gibeault, Lilibeth A. Date of Service: 01/03/2019 2:30  PM Medical Record Number: [id number] Patient Account Number: [id number] Date of Birth/Sex: 01-26-1940 (78 y.o. F) Treating RN: Cornell Barman Primary Care Provider: Lavera [name] Other Clinician: Referring Provider: Lavera [name] Treating Provider/Extender: Tito Dine in Treatment: 5 Constitutional Sitting or standing Blood Pressure is within target range for patient.. Pulse regular and within target range for patient.Marland Kitchen Respirations regular, non-labored and within target range.. Temperature is normal and within the target range for the patient.Marland Kitchen appears in no distress. Notes Wound examination. Left lateral heel looked close to me. Scattered small eschars but I do not believe there is anything underneath this. She does not have much subcutaneous tissue here. And ongoing pressure relief and protecting this area and her foot wear and at night when she is in bed seems indicated. As noted she had what looks to be an inverted left ankle. Electronic Signature(s) Signed: 01/03/2019 5:48:31 PM By: [name] MD Entered By: [name] on 01/03/2019 17:22:15 [name], [name] ([id number]) -------------------------------------------------------------------------------- Physician Orders Details Patient Name: Wiberg, Orville A. Date of Service: 01/03/2019 2:30 PM Medical Record Number: [id number] Patient Account Number: [id number] Date of Birth/Sex: [birth year] (79 y.o. F) Treating RN: Cornell Barman Primary Care Provider: Lavera [name] Other Clinician: Referring Provider: Lavera [name] Treating Provider/Extender: Tito Dine in Treatment: 5 Verbal / Phone Orders: No Diagnosis Coding Discharge From [facility] o Discharge from [facility] Complete Electronic Signature(s) Signed: 01/03/2019 5:48:31 PM By: [name] MD Signed: 01/17/2019 4:51:46 PM By: Gretta Cool, BSN, RN, CWS, Kim RN, BSN Entered By: Gretta Cool, BSN, RN, CWS, Kim on 01/03/2019 15:52:44 [name], [name] ([id number]) -------------------------------------------------------------------------------- Problem List Details Patient Name: Mathe, Kilyn A. Date of Service: 01/03/2019 2:30 PM Medical Record Number: [id number] Patient Account Number: [id number] Date of Birth/Sex: Nov 26, 1940 (79 y.o. 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  ([id number]) -------------------------------------------------------------------------------- Progress Note Details Patient Name: Heckmann, Leanah A. Date of Service: 01/03/2019 2:30 PM Medical Record Number: [id number] Patient Account Number: [id number] Date of Birth/Sex: 1940/01/15 (79 y.o. F) Treating RN: Cornell Barman Primary Care Provider: Lavera [name] Other Clinician: Referring Provider: Lavera [name] Treating Provider/Extender: Tito Dine in Treatment: 5 Subjective History of Present Illness (HPI) 12/24/15; Mrs. Mac is a 79 year old woman who is the mother of one of our other clinic patient's Tammy Lux. He comes [place] today for review a nonhealing surgical CABG wound. The patient underwent CABG o2 on October 31 17 at [facility]. She apparently had had a myocardial infarction in October but briefly presented with shortness of breath. She underwent surgery which I understand was uneventful. According to the patient her surgical incision never really healed in 2 spots. One is at the inferior recess question drain site, the other more superiorly about an inch from the superior surface of this it incision. She has been followed by her surgeon at [facility] who felt that both areas which he'll according to the patient. She was put on Augmentin 2  weeks ago and she is finished a course of this culture at that time showed Escherichia coli which should've been sensitive. The patient also relates that she has a history of allergy to ampicillin. Besides this the patient has a history of type 2 diabetes on metformin, multiple myeloma. She had a chest x-ray in mid October the did not show any abnormalities. I cannot see that she had any specific views of the sternum however. We have her referral note from her primary physician Dr. Bernie Covey. She was concerned about erythema around the wound and as mentioned the culture here showed Escherichia coli which should have been Augmentin  sensitive but was resistant to ampicillin, Keflex third generation cephalosporins quinolones and sulfonamides. [ESBL). The patient does not complain of fever or chills excessive pain. She does say that the lower surgical wound has been draining what looks to her like pus. She has been using triple antibiotic cream. 12/31/15 superior wound actually looks somewhat better. Inferiorly still a wound with some depth greater than 2 cm. Using Aquacel Ag. Culture I did of the drainage from the inferior wound and this surgical incision last week was negative 01/07/16; superior wound still requires debridement of surface eschar and nonviable subcutaneous tissue. Inferior wound measured at 1.8 cm. What I can see of the tissue here does not look very healthy. Original culture that I did on her presentation was negative however 01/27/16; the superior wound is closed she still has a deep probing wound of roughly 2 cm inferiorly. There is no overt infection no surrounding erythema. The patient tells me that 2 weeks ago she was developing chest pain and underwent a nuclear stress test. This was apparently negative however she developed chest pain shortly thereafter and had to go have a cardiac catheterization that did not show blockages. Since then she is not having chest pain but has unrelenting nausea making it difficult for her to eat. We reviewed her medications and she is on aspirin and Plavix however she apparently has a stent in place as well. 02/03/16; still using silver alginate. Depth of this is down from 1.8 cm the circumference is smaller and the depth down by 2 mm red no drainage and no surrounding infection 02/10/16; still using silver alginate depth down to 1.3 cm today measured by myself. Appears to be making slow progress. There is no surrounding tenderness.. The patient states she has unrelenting nausea which is nonexertional. Not related to meals. She has had no vomiting. She also tells me she is  had previous lap band surgery but does not give clear reflux type symptoms 02/25/16; the wound orifice of this lady's wound on her chest is down to 1 cm in depth and much smaller in terms of the overall diameter. She tells me that she was packing the wound recently and developed quite a bit of bleeding that took several hours to stop she is on Plavix and aspirin. She is not having any exertional symptoms that sound like coronary artery disease. She is complaining of unrelenting nausea but she is being evaluated by GI for this and apparently has an endoscopy planned for March. We have been using iodoform packing 03/03/16; small wound in terms of orifice but measures 1.2 cm in depth, not much different from last time. She still complains of nausea and has an upper GI series books she is not describing exertional chest pain, shortness of breath, nausea or fatigue. 03/16/16; I follow this patient for a nonhealing surgical wound at a  site of a previous CABG scar. She developed cellulitis in the incision postoperatively. Today she arrives with the wound orifice to miniscule to even consider measuring a depth. She came in with a Band-Aid over this, she could not get any of the iodoform packing in the increasingly small orifice 03/31/16; the patient's nonhealing surgical wound at the site of her previous CABG scar/inferior aspect is totally epithelialized. She tells me there was some bleeding out of this which was minimal last week however with careful inspection there is no [name], [name] ([id number]) evidence of any open here everything looks healthy and epithelialize. Surrounding tissue also looks normal the patient arrives today with erythema and a significant blister mostly involving the dorsal aspect of her right fifth finger. There were 2 small almost looked like puncture wounds on the anterior aspect of the DIP joint and the medial aspect of the finger. The area was tender and warm. There was no  involvement of any joint 04/20/16; The patient returns today for one last followup. Her original chest wound has remained closed which is fortunate, She came in last time with a right 5th finger blister and erythema. she did not tolerate the aquacel AG. C+S I did was negative. since I last saw her she has a skin tear on the right lateral elbow while doing a medical test READMISSION 03/23/17;[name] is a now 79 year old woman that we cared for in this clinic about a year ago with nonhealing wounds at the site of her CABG. She has a type II diabetic with diabetic neuropathy. At the point of this dictation I don't have a hemoglobin A1c however she is only on oral agents. She tells me that she noted a blister on her left lateral heel about 3 weeks ago. She brought this to attention of her primary doctor and was given DuoDERM. Later on her daughter actually removed the surface of this and some surrounding skin. She is in for our review of this. She does not have a history of wounds on her lower extremities. She does not have known PAD. She does have neuropathy in her feet ABIs in this clinic were noncompressible bilaterally 03/30/17-she is here in follow up evaluation for a left heel wound. She is voicing no complaints or concerns. She states she could not wear the surgical shoe. She continues to have maceration although not significantly different. We will switch to [facility] and she'll follow-up next week 04/06/17; patient is here for follow-up evaluation of the left heel wound. She has been using Prisma. The area is closed this week. READMISSION 03/01/2018. Patient is back in clinic today. She has type 2 diabetes with diabetic neuropathy. She states over the last week and a half she has noted painful areas on her bilateral outer heels. She has been in this clinic before in March 2019 almost a year ago with areas on her left heel. This healed out very easily. ABIs in our clinic have been consistently  noncompressible although she is not known to have a significant arterial issue. She is gone to wearing her open heeled bedroom slippers so as not to put any pressure on the heel although a week or so ago she was walking in shoes that may have contributed to the areas in question. 03/15/2018; 3-week follow-up. The right heel looks fine. The left heel deep tissue injury looks improved. Surface epithelium is intact. There is still evidence of subcutaneous damage but this is not evolved into a wound so far 3/25; 3-week  follow-up. There is no open wound on either heel. She complains of pain in the right heel when she walks however surface epithelium is intact. Peripheral pulses are palpable READMISSION 11/29/2018 [name] is a patient we know fairly well in this clinic for a history of wound difficulties. 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 She was here again in 2019 with a wound on the left lateral heel and then again in February and March of this year with bilateral wounds on her lateral heels. She is a type II diabetic with peripheral neuropathy but she is not felt to have PAD although her ABIs have been previously noncompressible. She tells me that she was hospitalized in August at [facility] in [place] for Covid she was in hospital for 3 weeks. She came out of this with apparently bilateral, lateral heel wounds. For the last 3 or 4 weeks she has had [facility] in the area on the right is actually healed however she has a small remaining open area on the left. She is not been using shoes with heels in them. We had previously recommended that she get bilateral "bunny boots" for use at night. I do not think she ever did this. Past medical history, type 2 diabetes with peripheral neuropathy, apparently a recent diagnosis of CLL, multiple myeloma, coronary artery disease, hypertension and Covid in August 2020 she is apparently still getting PT and OT. She feels  generally very weak but otherwise well 12/2; the patient is here for a small wound on the left lateral heel presumably which is a pressure area occurring while she was in hospital in August. We have been using silver collagen. She laments that she has been religious and offloading this wears bunny boots at night she does not understand why this is not getting better 12/23; the area on the left lateral heel appears closed. She is inverted at the ankle and I think this has some role in how this happened in the first place. This was a pressure ulcer that occurred while she was in hospital in August Mezera, Zurii A. ([id number]) Objective Constitutional Sitting or standing Blood Pressure is within target range for patient.. Pulse regular and within target range for patient.Marland Kitchen Respirations regular, non-labored and within target range.. Temperature is normal and within the target range for the patient.Marland Kitchen appears in no distress. Vitals Time Taken: 3:10 PM, Height: 62 in, Weight: 155 lbs, BMI: 28.3, Temperature: 98.1 F, Pulse: 73 bpm, Respiratory Rate: 16 breaths/min, Blood Pressure: 133/47 mmHg. General Notes: Wound examination. Left lateral heel looked close to me. Scattered small eschars but I do not believe there is anything underneath this. She does not have much subcutaneous tissue here. And ongoing pressure relief and protecting this area and her foot wear and at night when she is in bed seems indicated. As noted she had what looks to be an inverted left ankle. Integumentary (Hair, Skin) Wound #5 status is Healed - Epithelialized. Original cause of wound was Pressure Injury. The wound is located on the Left,Lateral Calcaneus. The wound measures 0cm length x 0cm width x 0cm depth; 0cm^2 area and 0cm^3 volume. There is Fat Layer (Subcutaneous Tissue) Exposed exposed. There is no tunneling or undermining noted. There is a medium amount of sanguinous drainage noted. The wound margin is flat and  intact. There is no granulation within the wound bed. There is a small (1-33%) amount of necrotic tissue within the wound bed including Eschar.  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 ([id number]) 1. The patient can be discharged from the wound care center 2. I have asked her to keep this area protected and her foot wear to make sure the heel is off the mattress at night etc. Electronic Signature(s) Signed: 01/03/2019 5:48:31 PM By: [name] MD Entered By: [name] on 01/03/2019 17:22:54 Michiels, Lakisa A. ([id number]) -------------------------------------------------------------------------------- SuperBill Details Patient Name: Condon, Stefania A. Date of Service: 01/03/2019 Medical Record Number: [id number] Patient Account Number: [id number] Date of Birth/Sex: 1940/01/16 (79 y.o. F) Treating RN: Cornell Barman Primary Care Provider: Lavera [name] Other Clinician: Referring Provider: Lavera [name] Treating Provider/Extender: Tito Dine in Treatment: 5 Diagnosis Coding ICD-10 Codes Code Description E11.621 Type 2 diabetes mellitus with foot ulcer E11.42 Type 2 diabetes mellitus with diabetic polyneuropathy L97.421 Non-pressure chronic ulcer of left heel and midfoot limited to breakdown of skin Facility Procedures CPT4 Code: [id number] Description: [phone] - WOUND CARE VISIT-LEV 2 EST PT Modifier: Quantity: 1 Physician Procedures CPT4 Code Description: [id number] - WC PHYS LEVEL 2 - EST PT ICD-10 Diagnosis Description E11.621 Type 2 diabetes mellitus with foot ulcer E11.42 Type 2 diabetes mellitus with diabetic polyneuropathy L97.421 Non-pressure chronic ulcer of left heel and  midfoot limited  t Modifier: o breakdown of sk Quantity: 1 in Electronic Signature(s) Signed: 01/03/2019 5:48:31 PM By: [name] MD Entered By: [name] on 01/03/2019 17:23:18

## 2019-01-22 ENCOUNTER — Other Ambulatory Visit: Payer: Self-pay | Admitting: Family Medicine

## 2019-01-22 ENCOUNTER — Ambulatory Visit
Admission: RE | Admit: 2019-01-22 | Discharge: 2019-01-22 | Disposition: A | Discharge status: Home / Self Care | Payer: Medicare Other | Attending: Family Medicine | Admitting: Family Medicine

## 2019-01-22 ENCOUNTER — Ambulatory Visit
Admission: RE | Admit: 2019-01-22 | Discharge: 2019-01-22 | Disposition: A | Discharge status: Home / Self Care | Payer: Medicare Other | Source: Ambulatory Visit | Attending: Family Medicine | Admitting: Family Medicine

## 2019-01-22 ENCOUNTER — Other Ambulatory Visit: Payer: Self-pay

## 2019-01-22 DIAGNOSIS — M25531 Pain in right wrist: Secondary | ICD-10-CM | POA: Diagnosis not present

## 2019-04-23 ENCOUNTER — Other Ambulatory Visit: Payer: Self-pay | Admitting: Neurology

## 2019-04-23 DIAGNOSIS — R251 Tremor, unspecified: Secondary | ICD-10-CM

## 2019-05-04 ENCOUNTER — Ambulatory Visit
Admission: RE | Admit: 2019-05-04 | Discharge: 2019-05-04 | Disposition: A | Discharge status: Home / Self Care | Payer: Medicare Other | Source: Ambulatory Visit | Attending: Neurology | Admitting: Neurology

## 2019-05-04 ENCOUNTER — Other Ambulatory Visit: Payer: Self-pay

## 2019-05-04 DIAGNOSIS — R251 Tremor, unspecified: Secondary | ICD-10-CM | POA: Insufficient documentation

## 2019-05-05 NOTE — Progress Notes (Signed)
[place]  [phone] [phone]  ID: Kai Levins Lubbers OB: [birth year]  MR#: [id number]  PXT#:[id number]  Patient Care Team: Lynnell Jude, MD as PCP - General (Family Medicine)  CHIEF COMPLAINT: Multiple myeloma, in remission. Iron deficiency anemia, CLL.  INTERVAL HISTORY: Patient is a 79 year old female who was diagnosed with multiple myeloma in 2015 and received approximately 1 year of chemotherapy before electively discontinuing.  She has been in very good partial remission since that time.  She has CLL that is chronic and unchanged and does not require treatment.  Finally, she recently was noted to have iron deficiency anemia and has received IV Feraheme at [facility].  She currently feels well and is at her baseline.  She has peripheral neuropathy, but no other neurologic complaints.  She denies any recent fevers or illnesses.  She has a good appetite and denies weight loss.  She denies any pain.  She has no chest pain, shortness of breath, cough, or hemoptysis.  She denies any nausea, vomiting, constipation, or diarrhea.  She denies any melena or hematochezia.  She has no urinary complaints.  Patient otherwise feels well and offers no further specific complaints today.  REVIEW OF SYSTEMS:   Review of Systems  Constitutional: Negative.  Negative for fever, malaise/fatigue and weight loss.  Respiratory: Negative.  Negative for cough, hemoptysis and shortness of breath.   Cardiovascular: Negative.  Negative for chest pain and leg swelling.  Gastrointestinal: Negative.  Negative for abdominal pain, blood in stool and melena.  Genitourinary: Negative.  Negative for dysuria and hematuria.  Musculoskeletal: Negative.  Negative for back pain.  Skin: Negative.  Negative for rash.  Neurological: Positive for sensory change. Negative for dizziness, focal weakness, weakness and headaches.  Psychiatric/Behavioral: Negative.  The patient is not  nervous/anxious.     As per HPI. Otherwise, a complete review of systems is negative.  PAST MEDICAL HISTORY: Past Medical History:  Diagnosis Date  . Anginal pain ([place])   . Anxiety   . Arthritis    "shoulders primarily" (01/13/2016)  . CAD (coronary artery disease)    s/p CABG in Oct 2017  . CHF (congestive heart failure) ([place])   . Chronic pain    from her multiple myeloma but says that her pain is usually in her legs/notes 01/13/2016  . High cholesterol   . History of blood transfusion    "numerous; related to procedures for my heart" (01/13/2016)  . Hypertension   . Ischemic cardiomyopathy    /nots 01/13/2016  . MI (myocardial infarction) ([place])    EKG on arrival 01/13/2016 showed NSR with evidence of old anterior and inferior infarcts  . MI (myocardial infarction) ([place]) 08/2015  . Multiple myeloma ([place]) dx'd 2015   off chemo since 2016  . Nausea and vomiting 05/10/2016   3-4 month hx of nausea and vomiting  . PAD (peripheral artery disease) ([place])   . Stroke [facility])    "several in 1 year; ? year" (01/13/2016)  . Type II diabetes mellitus ([place])     PAST SURGICAL HISTORY: Past Surgical History:  Procedure Laterality Date  . CARDIAC CATHETERIZATION N/A 01/14/2016   Procedure: Left Heart Cath and Cors/Grafts Angiography;  Surgeon: Burnell Blanks, MD;  Location: [facility];  Service: Cardiovascular;  Laterality: N/A;  . CARPAL TUNNEL RELEASE Left   . CORONARY ANGIOPLASTY WITH STENT PLACEMENT  05/2015   DES placed to the mid and distal LAD as well as OM2/notes 01/13/2016  .  CORONARY ANGIOPLASTY WITH STENT PLACEMENT  ~ 2010  . CORONARY ARTERY BYPASS GRAFT  11/11/2015   CABG X2 LIMA-LAD and SVG-OM/notes 01/13/2016  . DILATION AND CURETTAGE OF UTERUS    . FEMORAL-POPLITEAL BYPASS GRAFT Left 2013   Archie Endo 01/13/2016  . LAPAROSCOPIC GASTRIC BANDING  2007  . RENAL ARTERY STENT    . VAGINAL HYSTERECTOMY      FAMILY HISTORY: Family History  Problem Relation Age of Onset  .  Diabetes Mother   . Heart failure Mother   . Prostate cancer Neg Hx   . Kidney cancer Neg Hx   . Bladder Cancer Neg Hx   . Colon cancer Neg Hx   . Stomach cancer Neg Hx     ADVANCED DIRECTIVES (Y/N):  N  HEALTH MAINTENANCE: Social History   Tobacco Use  . Smoking status: Never Smoker  . Smokeless tobacco: Never Used  Substance Use Topics  . Alcohol use: Not Currently    Comment: occasional wine  . Drug use: No     Colonoscopy:  PAP:  Bone density:  Lipid panel:  Allergies  Allergen Reactions  . Benzodiazepines Anxiety and Other (See Comments)    Confusion, also  . Iodinated Diagnostic Agents Swelling    "Head swells"  . Other Swelling    Smoke--Head swelling  . Statins Swelling    "Head swells"  . Ticagrelor Shortness Of Breath  . Metoclopramide Other (See Comments)    Causes tremors and insomnia  . Amoxicillin Hives and Rash  . Codeine Hives and Rash  . Duloxetine Nausea Only  . Sulfa Antibiotics Hives and Rash    Current Outpatient Medications  Medication Sig Dispense Refill  . Alirocumab (PRALUENT) 75 MG/ML SOPN Inject 1 pen into the skin every 14 (fourteen) days.  2 pen 11  . aspirin 81 MG tablet Take 81 mg by mouth daily.    . clopidogrel (PLAVIX) 75 MG tablet Take 75 mg by mouth daily.   11  . diclofenac sodium (VOLTAREN) 1 % GEL Apply 2 g topically daily as needed (to affected, painful sites).     . fentaNYL (DURAGESIC) 50 MCG/HR Place 1 patch onto the skin every 3 (three) days. 5 patch 0  . fluticasone (FLONASE) 50 MCG/ACT nasal spray Place 1 spray into both nostrils as needed.    . furosemide (LASIX) 20 MG tablet Take 20 mg by mouth daily.    Marland Kitchen gabapentin (NEURONTIN) 300 MG capsule Take 600 mg by mouth See admin instructions. Take 600 mg by mouth in the morning, 600 mg at noontime, and 600 mg at bedtime    . HYDROmorphone (DILAUDID) 2 MG tablet Take 1 tablet (2 mg total) by mouth every 6 (six) hours as needed (breakthrough pain). 12 tablet 0  .  Insulin Glargine (LANTUS SOLOSTAR) 100 UNIT/ML Solostar Pen Inject 28 Units into the skin daily.     . isosorbide mononitrate (IMDUR) 30 MG 24 hr tablet Take 1 tablet (30 mg total) by mouth daily. Please make yearly appt with Dr. Meda Coffee for September before anymore refills. 1st attempt    . metoprolol tartrate (LOPRESSOR) 25 MG tablet Take 0.5 tablets (12.5 mg total) by mouth 2 (two) times daily. 90 tablet 3  . nitroGLYCERIN (NITROSTAT) 0.4 MG SL tablet Place 1 tablet (0.4 mg total) under the tongue every 5 (five) minutes as needed for chest pain. 25 tablet 2  . ondansetron (ZOFRAN ODT) 4 MG disintegrating tablet Take 1 tablet (4 mg total) by mouth every 8 (  eight) hours as needed for nausea or vomiting. 20 tablet 0  . pantoprazole (PROTONIX) 40 MG tablet Take 1 tablet (40 mg total) by mouth daily at 6 (six) AM. 30 tablet 0  . promethazine (PHENERGAN) 12.5 MG tablet Take 1 tablet (12.5 mg total) by mouth every 6 (six) hours. (Patient taking differently: Take 12.5 mg by mouth every 6 (six) hours as needed for nausea or vomiting. ) 100 tablet 0  . tamsulosin (FLOMAX) 0.4 MG CAPS capsule Take 0.4 mg by mouth daily.    Marland Kitchen tolterodine (DETROL LA) 4 MG 24 hr capsule Take 4 mg by mouth daily.    . Cyanocobalamin 1000 MCG/ML KIT Inject 1,000 mcg as directed every Wednesday.    . escitalopram (LEXAPRO) 10 MG tablet Take 10 mg by mouth daily.    . famotidine (PEPCID) 20 MG tablet Take 1 tablet (20 mg total) by mouth daily. (Patient not taking: Reported on 05/07/2019) 14 tablet 0  . magnesium oxide (MAG-OX) 400 MG tablet Take 400 mg by mouth daily as needed (to increase levels).     . Vitamin D, Ergocalciferol, (DRISDOL) 50000 units CAPS capsule Take 50,000 Units by mouth 2 (two) times a week. Mondays and Thursdays  5   No current facility-administered medications for this visit.    OBJECTIVE: Vitals:   05/07/19 1046  BP: (!) 148/56  Pulse: 71  Resp: 20  Temp: (!) 96 F (35.6 C)  SpO2: 100%     Body  mass index is 28.97 kg/m.    ECOG FS:1 - Symptomatic but completely ambulatory  General: Well-developed, well-nourished, no acute distress. Eyes: Pink conjunctiva, anicteric sclera. HEENT: Normocephalic, moist mucous membranes. Lungs: No audible wheezing or coughing. Heart: Regular rate and rhythm. Abdomen: Soft, nontender, no obvious distention. Musculoskeletal: No edema, cyanosis, or clubbing. Neuro: Alert, answering all questions appropriately. Cranial nerves grossly intact. Skin: No rashes or petechiae noted. Psych: Normal affect. Lymphatics: No cervical, calvicular, axillary or inguinal LAD.   LAB RESULTS:  Lab Results  Component Value Date   NA 131 (L) 09/03/2018   K 4.4 09/03/2018   CL 99 09/03/2018   CO2 26 09/03/2018   GLUCOSE 314 (H) 09/03/2018   BUN 37 (H) 09/03/2018   CREATININE 1.14 (H) 09/03/2018   CALCIUM 9.7 09/03/2018   PROT 6.2 (L) 09/03/2018   ALBUMIN 2.4 (L) 09/03/2018   AST 25 09/03/2018   ALT 20 09/03/2018   ALKPHOS 47 09/03/2018   BILITOT 0.4 09/03/2018   GFRNONAA 46 (L) 09/03/2018   GFRAA 54 (L) 09/03/2018    Lab Results  Component Value Date   WBC 9.2 09/03/2018   NEUTROABS 4.5 08/23/2018   HGB 9.5 (L) 09/03/2018   HCT 30.2 (L) 09/03/2018   MCV 77.4 (L) 09/03/2018   PLT 332 09/03/2018     STUDIES: MR BRAIN WO CONTRAST  Result Date: 05/05/2019 CLINICAL DATA:  Tremor.  Unsteady gait. EXAM: MRI HEAD WITHOUT CONTRAST TECHNIQUE: Multiplanar, multiecho pulse sequences of the brain and surrounding structures were obtained without intravenous contrast. COMPARISON:  Head CT 08/26/2018 FINDINGS: Brain: Diffusion imaging does not show any acute or subacute infarction. 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 No cerebellar abnormality. Cerebral hemispheres show age related atrophy with mild chronic small-vessel change of the white matter. No cortical or large vessel territory infarction. No mass lesion, hemorrhage, hydrocephalus or  extra-axial collection. Vascular: Major vessels at the base of the brain show flow. Skull and upper cervical spine: Negative Sinuses/Orbits: Clear/normal Other:  None IMPRESSION: No acute finding. 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 Mild age related volume loss. Minimal small vessel change of the hemispheric white matter. Electronically Signed   By: Nelson Chimes M.D.   On: 05/05/2019 20:46    ASSESSMENT: Multiple myeloma, in remission. Iron deficiency anemia, CLL.  PLAN:    1. Multiple myeloma, in remission: Patient was initially diagnosed in 2015 and received approximately 1 year of chemotherapy including Revlimid and Velcade.  Patient states her quality of life was poor and she electively discontinued treatment.  Since that time it appears she has been in very good partial remission and she reports her most recent bone marrow biopsy approximately 2 months ago reported only 10% plasma cells.  Patient states she will decline any further treatments if her myeloma recurs.  She wishes to continue following up closely with her physician at [facility]. 2.  Iron deficiency anemia: Unclear etiology.  Patient has now received 2 doses of IV Feraheme, most recently approximately 1 week ago.  Have recommended she discuss with her primary care the possibility of a repeat colonoscopy to assess for source. 3.  CLL: Likely stage 0 no intervention needed. 4.  Peripheral neuropathy: Continue follow-up and treatment per neurology. 5.  Disposition: Patient states she wishes to continue all of her care at [facility] where she has been followed for greater than 5 years.  No further follow-up has been scheduled.  Patient expressed understanding and was in agreement with this plan. She also understands that She can call clinic at any time with any questions, concerns, or complaints.    Lloyd Huger, MD   05/07/2019 1:55 PM

## 2019-05-07 ENCOUNTER — Encounter: Payer: Self-pay | Admitting: Oncology

## 2019-05-07 ENCOUNTER — Inpatient Hospital Stay: Payer: Medicare Other

## 2019-05-07 ENCOUNTER — Other Ambulatory Visit: Payer: Self-pay

## 2019-05-07 ENCOUNTER — Inpatient Hospital Stay: Payer: Medicare Other | Attending: Oncology | Admitting: Oncology

## 2019-05-07 VITALS — BP 148/56 | HR 71 | Temp 96.0°F | Resp 20 | Wt 158.4 lb

## 2019-05-07 DIAGNOSIS — Z8249 Family history of ischemic heart disease and other diseases of the circulatory system: Secondary | ICD-10-CM | POA: Diagnosis not present

## 2019-05-07 DIAGNOSIS — R2681 Unsteadiness on feet: Secondary | ICD-10-CM

## 2019-05-07 DIAGNOSIS — I252 Old myocardial infarction: Secondary | ICD-10-CM

## 2019-05-07 DIAGNOSIS — Z880 Allergy status to penicillin: Secondary | ICD-10-CM

## 2019-05-07 DIAGNOSIS — Z882 Allergy status to sulfonamides status: Secondary | ICD-10-CM

## 2019-05-07 DIAGNOSIS — Z833 Family history of diabetes mellitus: Secondary | ICD-10-CM

## 2019-05-07 DIAGNOSIS — Z79899 Other long term (current) drug therapy: Secondary | ICD-10-CM | POA: Diagnosis not present

## 2019-05-07 DIAGNOSIS — Z885 Allergy status to narcotic agent status: Secondary | ICD-10-CM | POA: Diagnosis not present

## 2019-05-07 DIAGNOSIS — R251 Tremor, unspecified: Secondary | ICD-10-CM | POA: Diagnosis not present

## 2019-05-07 DIAGNOSIS — M1990 Unspecified osteoarthritis, unspecified site: Secondary | ICD-10-CM

## 2019-05-07 DIAGNOSIS — C9001 Multiple myeloma in remission: Secondary | ICD-10-CM | POA: Diagnosis not present

## 2019-05-07 DIAGNOSIS — D509 Iron deficiency anemia, unspecified: Secondary | ICD-10-CM | POA: Diagnosis not present

## 2019-05-07 DIAGNOSIS — C9110 Chronic lymphocytic leukemia of B-cell type not having achieved remission: Secondary | ICD-10-CM | POA: Diagnosis not present

## 2019-05-07 DIAGNOSIS — I2510 Atherosclerotic heart disease of native coronary artery without angina pectoris: Secondary | ICD-10-CM | POA: Diagnosis not present

## 2019-05-07 DIAGNOSIS — E1142 Type 2 diabetes mellitus with diabetic polyneuropathy: Secondary | ICD-10-CM | POA: Diagnosis not present

## 2019-05-07 DIAGNOSIS — Z8673 Personal history of transient ischemic attack (TIA), and cerebral infarction without residual deficits: Secondary | ICD-10-CM

## 2019-05-07 DIAGNOSIS — Z888 Allergy status to other drugs, medicaments and biological substances status: Secondary | ICD-10-CM | POA: Diagnosis not present

## 2019-05-07 NOTE — Progress Notes (Signed)
New patient here today for initial visit. Denies any pain or concerns. States she recently lost daughter and has been losing weight. Reports her appetite and upset about loss of her daughter is contributing factors. Denies any other concerns.

## 2019-05-31 ENCOUNTER — Encounter: Payer: Self-pay | Admitting: Emergency Medicine

## 2019-05-31 ENCOUNTER — Emergency Department
Admission: EM | Admit: 2019-05-31 | Discharge: 2019-05-31 | Disposition: A | Discharge status: Home / Self Care | Payer: Medicare Other | Attending: Emergency Medicine | Admitting: Emergency Medicine

## 2019-05-31 ENCOUNTER — Emergency Department: Payer: Medicare Other

## 2019-05-31 ENCOUNTER — Other Ambulatory Visit: Payer: Self-pay

## 2019-05-31 DIAGNOSIS — Z79899 Other long term (current) drug therapy: Secondary | ICD-10-CM | POA: Insufficient documentation

## 2019-05-31 DIAGNOSIS — E1140 Type 2 diabetes mellitus with diabetic neuropathy, unspecified: Secondary | ICD-10-CM | POA: Diagnosis not present

## 2019-05-31 DIAGNOSIS — I25110 Atherosclerotic heart disease of native coronary artery with unstable angina pectoris: Secondary | ICD-10-CM | POA: Diagnosis not present

## 2019-05-31 DIAGNOSIS — Z7901 Long term (current) use of anticoagulants: Secondary | ICD-10-CM | POA: Insufficient documentation

## 2019-05-31 DIAGNOSIS — R531 Weakness: Secondary | ICD-10-CM | POA: Diagnosis not present

## 2019-05-31 DIAGNOSIS — R110 Nausea: Secondary | ICD-10-CM | POA: Insufficient documentation

## 2019-05-31 DIAGNOSIS — Z951 Presence of aortocoronary bypass graft: Secondary | ICD-10-CM | POA: Diagnosis not present

## 2019-05-31 DIAGNOSIS — E119 Type 2 diabetes mellitus without complications: Secondary | ICD-10-CM | POA: Diagnosis not present

## 2019-05-31 DIAGNOSIS — R198 Other specified symptoms and signs involving the digestive system and abdomen: Secondary | ICD-10-CM | POA: Diagnosis not present

## 2019-05-31 LAB — CBC WITH DIFFERENTIAL/PLATELET
Abs Immature Granulocytes: 0.02 10*3/uL (ref 0.00–0.07)
Basophils Absolute: 0 10*3/uL (ref 0.0–0.1)
Basophils Relative: 0 %
Eosinophils Absolute: 0.1 10*3/uL (ref 0.0–0.5)
Eosinophils Relative: 1 %
HCT: 36.7 % (ref 36.0–46.0)
Hemoglobin: 11.8 g/dL — ABNORMAL LOW (ref 12.0–15.0)
Immature Granulocytes: 0 %
Lymphocytes Relative: 70 %
Lymphs Abs: 5.1 10*3/uL — ABNORMAL HIGH (ref 0.7–4.0)
MCH: 26.1 pg (ref 26.0–34.0)
MCHC: 32.2 g/dL (ref 30.0–36.0)
MCV: 81.2 fL (ref 80.0–100.0)
Monocytes Absolute: 0.3 10*3/uL (ref 0.1–1.0)
Monocytes Relative: 4 %
Neutro Abs: 1.9 10*3/uL (ref 1.7–7.7)
Neutrophils Relative %: 25 %
Platelets: 136 10*3/uL — ABNORMAL LOW (ref 150–400)
RBC: 4.52 MIL/uL (ref 3.87–5.11)
RDW: 19.3 % — ABNORMAL HIGH (ref 11.5–15.5)
Smear Review: NORMAL
WBC: 7.4 10*3/uL (ref 4.0–10.5)
nRBC: 0 % (ref 0.0–0.2)

## 2019-05-31 LAB — COMPREHENSIVE METABOLIC PANEL
ALT: 12 U/L (ref 0–44)
AST: 21 U/L (ref 15–41)
Albumin: 3.6 g/dL (ref 3.5–5.0)
Alkaline Phosphatase: 61 U/L (ref 38–126)
Anion gap: 11 (ref 5–15)
BUN: 20 mg/dL (ref 8–23)
CO2: 25 mmol/L (ref 22–32)
Calcium: 9.4 mg/dL (ref 8.9–10.3)
Chloride: 101 mmol/L (ref 98–111)
Creatinine, Ser: 1.12 mg/dL — ABNORMAL HIGH (ref 0.44–1.00)
GFR calc Af Amer: 55 mL/min — ABNORMAL LOW (ref >60)
GFR calc non Af Amer: 47 mL/min — ABNORMAL LOW (ref >60)
Glucose, Bld: 127 mg/dL — ABNORMAL HIGH (ref 70–99)
Potassium: 3.6 mmol/L (ref 3.5–5.1)
Sodium: 137 mmol/L (ref 135–145)
Total Bilirubin: 0.8 mg/dL (ref 0.3–1.2)
Total Protein: 7.4 g/dL (ref 6.5–8.1)

## 2019-05-31 LAB — LIPASE, BLOOD: Lipase: 22 U/L (ref 11–51)

## 2019-05-31 LAB — TROPONIN I (HIGH SENSITIVITY)
Troponin I (High Sensitivity): 11 ng/L (ref <18)
Troponin I (High Sensitivity): 9 ng/L (ref <18)

## 2019-05-31 MED ORDER — HALOPERIDOL LACTATE 5 MG/ML IJ SOLN
1.0000 mg | Freq: Once | INTRAMUSCULAR | Status: AC
  Administered 2019-05-31: 1 mg via INTRAVENOUS
  Filled 2019-05-31: qty 1

## 2019-05-31 MED ORDER — SODIUM CHLORIDE 0.9 % IV BOLUS
500.0000 mL | Freq: Once | INTRAVENOUS | Status: AC
  Administered 2019-05-31: 500 mL via INTRAVENOUS

## 2019-05-31 MED ORDER — DROPERIDOL 2.5 MG/ML IJ SOLN
1.2500 mg | Freq: Once | INTRAMUSCULAR | Status: AC
  Administered 2019-05-31: 1.25 mg via INTRAVENOUS

## 2019-05-31 MED ORDER — ONDANSETRON 4 MG PO TBDP: ORAL_TABLET | ORAL | 0 refills | Status: AC

## 2019-05-31 NOTE — ED Provider Notes (Signed)
[facility] Emergency Department Provider Note  ____________________________________________   First MD Initiated Contact with Patient 05/31/19 0354     (approximate)  I have reviewed the triage vital signs and the nursing notes.   HISTORY  Chief Complaint Nausea    HPI [name] is a 79 y.o. female with medical history as listed below which notably includes coronary artery disease status post ACS which she reports presented only with nausea.  She presents today  by EMS from home for which she describes as 2 days of nausea and dry heaving, inability to tolerate oral intake, and generalized weakness.  She has had a little bit of pain on the left jaw but no chest pain and no shortness of breath.  However she says she never has chest pain or shortness of breath "with my heart attacks".  She denies fever/chills, sore throat, abdominal pain, and dysuria.  She describes the nausea is severe and nothing particular makes it better or worse.  She is afraid that she is having another heart attack.        Past Medical History:  Diagnosis Date  . Anginal pain ([place])   . Anxiety   . Arthritis    "shoulders primarily" (01/13/2016)  . CAD (coronary artery disease)    s/p CABG in Oct 2017  . CHF (congestive heart failure) ([place])   . Chronic pain    from her multiple myeloma but says that her pain is usually in her legs/notes 01/13/2016  . High cholesterol   . History of blood transfusion    "numerous; related to procedures for my heart" (01/13/2016)  . Hypertension   . Ischemic cardiomyopathy    /nots 01/13/2016  . MI (myocardial infarction) ([place])    EKG on arrival 01/13/2016 showed NSR with evidence of old anterior and inferior infarcts  . MI (myocardial infarction) ([place]) 08/2015  . Multiple myeloma ([place]) dx'd 2015   off chemo since 2016  . Nausea and vomiting 05/10/2016   3-4 month hx of nausea and vomiting  . PAD (peripheral artery disease) ([place])   . Stroke  [facility])    "several in 1 year; ? year" (01/13/2016)  . Type II diabetes mellitus [facility])     Patient Active Problem List   Diagnosis Date Noted  . Hyponatremia 08/27/2018  . Iron deficiency anemia 08/27/2018  . Nausea 08/27/2018  . Gastroenteritis due to COVID-19 virus 08/27/2018  . Severe nausea 10/05/2016  . Nausea & vomiting 10/04/2016  . Fistula 09/30/2016  . Intractable nausea and vomiting 09/18/2016  . Lower extremity edema 05/10/2016  . Ischemic cardiomyopathy 02/23/2016  . Chills 02/23/2016  . Weight loss 02/23/2016  . Unstable angina pectoris ([place]) 01/13/2016  . Chest pain   . PVD (peripheral vascular disease) ([place])   . Coronary artery disease involving coronary bypass graft of native heart with angina pectoris ([place])   . Peripheral arterial occlusive disease ([place]) 12/15/2015  . Acute blood loss anemia 11/13/2015  . Leukocytosis 11/13/2015  . Chronic pain 11/12/2015  . Acute postoperative pain 11/12/2015  . 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 Acute UTI 11/05/2015  . Edema of left lower extremity 06/16/2015  . Edema of upper extremity 06/16/2015  . Knee pain, left 06/14/2015  . AKI (acute kidney injury) ([place]) 06/11/2015  . Hyperkalemia 06/11/2015  . Hypomagnesemia 06/11/2015  . Acute leg pain, left 05/20/2015  . Multiple bruises 05/20/2015  . Recurrent urinary tract infection 03/26/2014  . Recurrent UTI 03/26/2014  .  H/O gastric bypass 11/07/2013  . Peripheral neuropathy 10/10/2013  . Encounter for antineoplastic chemotherapy 10/10/2013  . Multiple myeloma ([place]) 07/16/2013  . History of critical ischemia of lower extremity 05/15/2013  . Critical lower limb ischemia 05/15/2013  . Intermittent claudication ([place]) 07/27/2011  . Chronic coronary artery disease 04/26/2011  . Gastroesophageal reflux disease 04/26/2011  . Type 2 diabetes mellitus ([place]) 03/30/2011  . Hyperlipidemia, unspecified 03/30/2011  . Hypertension 03/30/2011    Past Surgical History:  Procedure Laterality  Date  . CARDIAC CATHETERIZATION N/A 01/14/2016   Procedure: Left Heart Cath and Cors/Grafts Angiography;  Surgeon: Burnell Blanks, MD;  Location: [facility];  Service: Cardiovascular;  Laterality: N/A;  . CARPAL TUNNEL RELEASE Left   . CORONARY ANGIOPLASTY WITH STENT PLACEMENT  05/2015   DES placed to the mid and distal LAD as well as OM2/notes 01/13/2016  . CORONARY ANGIOPLASTY WITH STENT PLACEMENT  ~ 2010  . CORONARY ARTERY BYPASS GRAFT  11/11/2015   CABG X2 LIMA-LAD and SVG-OM/notes 01/13/2016  . DILATION AND CURETTAGE OF UTERUS    . FEMORAL-POPLITEAL BYPASS GRAFT Left 2013   Archie Endo 01/13/2016  . LAPAROSCOPIC GASTRIC BANDING  2007  . RENAL ARTERY STENT    . VAGINAL HYSTERECTOMY      Prior to Admission medications   Medication Sig Start Date End Date Taking? Authorizing Provider  Alirocumab (PRALUENT) 75 MG/ML SOPN Inject 1 pen into the skin every 14 (fourteen) days.  05/06/16   Dorothy Spark, MD  aspirin 81 MG tablet Take 81 mg by mouth daily.    [provider]  clopidogrel (PLAVIX) 75 MG tablet Take 75 mg by mouth daily.  04/13/17   [provider]  Cyanocobalamin 1000 MCG/ML KIT Inject 1,000 mcg as directed every Wednesday.    [provider]  diclofenac sodium (VOLTAREN) 1 % GEL Apply 2 g topically daily as needed (to affected, painful sites).     [provider]  escitalopram (LEXAPRO) 10 MG tablet Take 10 mg by mouth daily.    [provider]  famotidine (PEPCID) 20 MG tablet Take 1 tablet (20 mg total) by mouth daily. Patient not taking: Reported on 05/07/2019 08/23/18 08/23/19  Merlyn Lot, MD  fentaNYL (DURAGESIC) 50 MCG/HR Place 1 patch onto the skin every 3 (three) days. 09/07/18   Ghimire, Henreitta Leber, MD  fluticasone (FLONASE) 50 MCG/ACT nasal spray Place 1 spray into both nostrils as needed. 12/01/18   [provider]  furosemide (LASIX) 20 MG tablet Take 20 mg by mouth daily. 11/15/18   [provider]  gabapentin (NEURONTIN) 300 MG capsule Take 600 mg by mouth See admin instructions. Take 600 mg by mouth in the morning, 600 mg at noontime, and 600 mg at bedtime    [provider]  HYDROmorphone (DILAUDID) 2 MG tablet Take 1 tablet (2 mg total) by mouth every 6 (six) hours as needed (breakthrough pain). 09/07/18   Ghimire, Henreitta Leber, MD  Insulin Glargine (LANTUS SOLOSTAR) 100 UNIT/ML Solostar Pen Inject 28 Units into the skin daily.  06/16/17   [provider]  isosorbide mononitrate (IMDUR) 30 MG 24 hr tablet Take 1 tablet (30 mg total) by mouth daily. Please make yearly appt with Dr. Meda Coffee for September before anymore refills. 1st attempt 09/07/18   Jonetta Osgood, MD  magnesium oxide (MAG-OX) 400 MG tablet Take 400 mg by mouth daily as needed (to increase levels).     [provider]  metoprolol tartrate (  LOPRESSOR) 25 MG tablet Take 0.5 tablets (12.5 mg total) by mouth 2 (two) times daily. 02/23/16 05/07/19  Dorothy Spark, MD  nitroGLYCERIN (NITROSTAT) 0.4 MG SL tablet Place 1 tablet (0.4 mg total) under the tongue every 5 (five) minutes as needed for chest pain. 01/15/16   Arbutus Leas, NP  ondansetron (ZOFRAN ODT) 4 MG disintegrating tablet Allow 1-2 tablets to dissolve in your mouth every 8 hours as needed for nausea/vomiting 05/31/19   Hinda Kehr, MD  pantoprazole (PROTONIX) 40 MG tablet Take 1 tablet (40 mg total) by mouth daily at 6 (six) AM. 10/07/16   Geradine Girt, DO  promethazine (PHENERGAN) 12.5 MG tablet Take 1 tablet (12.5 mg total) by mouth every 6 (six) hours. Patient taking differently: Take 12.5 mg by mouth every 6 (six) hours as needed for nausea or vomiting.  01/11/18   Schuyler Amor, MD  tamsulosin (FLOMAX) 0.4 MG CAPS capsule Take 0.4 mg by mouth daily. 11/17/17 01/15/20  [provider]  tolterodine (DETROL LA) 4 MG 24 hr capsule Take 4 mg by mouth daily.    [provider]  Vitamin D, Ergocalciferol, (DRISDOL) 50000 units  CAPS capsule Take 50,000 Units by mouth 2 (two) times a week. Mondays and Thursdays 05/06/17   [provider]    Allergies Benzodiazepines, Iodinated diagnostic agents, Other, Statins, Ticagrelor, Metoclopramide, Amoxicillin, Codeine, Duloxetine, and Sulfa antibiotics  Family History  Problem Relation Age of Onset  . Diabetes Mother   . Heart failure Mother   . Prostate cancer Neg Hx   . Kidney cancer Neg Hx   . Bladder Cancer Neg Hx   . Colon cancer Neg Hx   . Stomach cancer Neg Hx     Social History Social History   Tobacco Use  . Smoking status: Never Smoker  . Smokeless tobacco: Never Used  Substance Use Topics  . Alcohol use: Not Currently    Comment: occasional wine  . Drug use: No    Review of Systems Constitutional: No fever/chills Eyes: No visual changes. ENT: No sore throat. Cardiovascular: Denies chest pain. Respiratory: Denies shortness of breath. Gastrointestinal: Nausea and dry heaves.  Inability to tolerate oral intake.  No abdominal pain.  No diarrhea.  No constipation. Genitourinary: Negative for dysuria. Musculoskeletal: Negative for neck pain.  Negative for back pain. Integumentary: Negative for rash. Neurological: Negative for headaches, focal weakness or numbness.   ____________________________________________   PHYSICAL EXAM:  VITAL SIGNS: ED Triage Vitals  Enc Vitals Group     BP 05/31/19 0138 (!) 187/56     Pulse Rate 05/31/19 0138 69     Resp 05/31/19 0138 18     Temp 05/31/19 0138 98.2 F (36.8 C)     Temp Source 05/31/19 0138 Tympanic     SpO2 05/31/19 0138 97 %     Weight 05/31/19 0139 68 kg (150 lb)     Height 05/31/19 0139 1.575 m (5' 2" )     Head Circumference --      Peak Flow --      Pain Score 05/31/19 0139 10     Pain Loc --      Pain Edu? --      Excl. in [place]? --     Constitutional: Alert and oriented.  Appears uncomfortable but not in distress. Eyes: Conjunctivae are normal.  Head: Atraumatic. Nose: No  congestion/rhinnorhea. Mouth/Throat: Patient is wearing a mask. Neck: No stridor.  No meningeal signs.   Cardiovascular: Normal rate,  regular rhythm. Good peripheral circulation. Grossly normal heart sounds. Respiratory: Normal respiratory effort.  No retractions. Gastrointestinal: Soft and nontender. No distention.  Musculoskeletal: No lower extremity tenderness nor edema. No gross deformities of extremities. Neurologic:  Normal speech and language. No gross focal neurologic deficits are appreciated.  Skin:  Skin is warm, dry and intact. Psychiatric: Mood and affect are depressed but appropriate under the circumstances.  ____________________________________________   LABS (all labs ordered are listed, but only abnormal results are displayed)  Labs Reviewed  CBC WITH DIFFERENTIAL/PLATELET - Abnormal; Notable for the following components:      Result Value   Hemoglobin 11.8 (*)    RDW 19.3 (*)    Platelets 136 (*)    Lymphs Abs 5.1 (*)    All other components within normal limits  COMPREHENSIVE METABOLIC PANEL - Abnormal; Notable for the following components:   Glucose, Bld 127 (*)    Creatinine, Ser 1.12 (*)    GFR calc non Af Amer 47 (*)    GFR calc Af Amer 55 (*)    All other components within normal limits  URINE CULTURE  LIPASE, BLOOD  URINALYSIS, ROUTINE W REFLEX MICROSCOPIC  TROPONIN I (HIGH SENSITIVITY)  TROPONIN I (HIGH SENSITIVITY)   ____________________________________________  EKG  ED ECG REPORT I, Hinda Kehr, the attending physician, personally viewed and interpreted this ECG.  Date: 05/31/2019 EKG Time: 1:40 AM Rate: 70 Rhythm: normal sinus rhythm QRS Axis: normal Intervals: normal ST/T Wave abnormalities: Non-specific ST segment / T-wave changes, but no clear evidence of acute ischemia. Narrative Interpretation: no definitive evidence of acute ischemia; does not meet STEMI criteria.   ____________________________________________  RADIOLOGY I,  Hinda Kehr, personally viewed and evaluated these images (plain radiographs) as part of my medical decision making, as well as reviewing the written report by the radiologist.  ED MD interpretation: No indication of bowel obstruction.  Patient has a LAP-BAND in normal position.  There is some gas in the urinary bladder.  Official radiology report(s): CT ABDOMEN PELVIS WO CONTRAST  Result Date: 05/31/2019 CLINICAL DATA:  Mechanical bowel obstruction suspected. Nausea and vomiting with weakness for 2 days EXAM: CT ABDOMEN AND PELVIS WITHOUT CONTRAST TECHNIQUE: Multidetector CT imaging of the abdomen and pelvis was performed following the standard protocol without IV contrast. COMPARISON:  09/12/2016 FINDINGS: Lower chest:  Coronary atherosclerosis.  No acute finding Hepatobiliary: No focal liver abnormality.No evidence of biliary obstruction or stone. Pancreas: Unremarkable. Spleen: Unremarkable. Adrenals/Urinary Tract: Negative adrenals. No hydronephrosis or stone. Mild symmetric renal atrophy. Partially collapsed bladder with pneumaturia. Stomach/Bowel: No obstruction. No appendicitis. Lap band in place without regional inflammation. Extensive sigmoid diverticulosis. Uncomplicated although large mid duodenal diverticulum. Vascular/Lymphatic: No acute vascular abnormality. No mass or adenopathy. Reproductive:Hysterectomy.  Pelvic floor laxity. Other: Trace peritoneal fluid in the pelvis, nonspecific. Musculoskeletal: No acute abnormalities. Osteopenia and advanced lower lumbar disc degeneration. Also lumbar facet spurring with mild L3-4 anterolisthesis. IMPRESSION: 1. Negative for bowel obstruction or visible inflammation. Lap band in normal position. 2. Gas in the urinary bladder, please correlate with urinalysis. 3. Sigmoid diverticulosis. 4.  Aortic Atherosclerosis (ICD10-I70.0).  Coronary atherosclerosis. Electronically Signed   By: Monte Fantasia M.D.   On: 05/31/2019 06:20     ____________________________________________   PROCEDURES   Procedure(s) performed (including Critical Care):  .1-3 Lead EKG Interpretation Performed by: Hinda Kehr, MD Authorized by: Hinda Kehr, MD     Interpretation: normal     ECG rate:  70   ECG rate assessment: normal  Rhythm: sinus rhythm     Ectopy: none     Conduction: normal       ____________________________________________   INITIAL IMPRESSION / MDM / ASSESSMENT AND PLAN / ED COURSE  As part of my medical decision making, I reviewed the following data within the [facility] notes reviewed and incorporated, Labs reviewed , EKG interpreted , Old chart reviewed and Notes from prior ED visits   Differential diagnosis includes, but is not limited to, SBO/ileus, nonspecific gastritis, acute intra abdominal infection, ACS.  The patient is on the cardiac monitor to evaluate for evidence of arrhythmia and/or significant heart rate changes.  The patient has a history of allergic reaction to IV contrast so I am ordering a CT scan without contrast of the abdomen and pelvis to evaluate for the possibility of SBO/ileus.  She received Zofran 4 mg prior to arrival which did not help the nausea and I ordered haloperidol 1 mg IV for the intractable nausea.  Comprehensive metabolic panel is generally within normal limits and CBC is reassuring as well.  Initial high-sensitivity troponin was 11 and we will plan for a repeat.  Lipase is normal.  She has not yet provided a urine specimen.  I have ordered 500 mL of normal saline given her limited p.o. intake over the last few days.  I will reassess when more the work-up is complete.  Patient is not having any pain and has a reassuring abdominal exam with no tenderness to palpation no distention.  Clinical Course as of May 31 818  Thu May 31, 2019  0608 Repeat high-sensitivity troponin is 9 which is down 2 points from the initial results of 11.   [CF]   0654 No acute abnormality identified on CT scan of the abdomen and pelvis although a previous lap band is noted.  However the patient still says she is severely nauseated and cannot tolerate anything by mouth.  I am ordering droperidol 1.25 mg IV as another attempt at controlling the nausea.  At this point she may require admission for fluids and intractable nausea/vomiting.  CT ABDOMEN PELVIS WO CONTRAST [CF]  [phone] The patient says she is feeling better.  She feels reassured by her otherwise reassuring work-up.  Regarding the air in her bladder, she said that she is currently about halfway through a course of antibiotics prescribed by her primary care doctor for urinary tract infection and her symptoms are improving.  She does not want to change therapy at this time.  She has plans to follow-up with her PCP within the next week which I think is appropriate.  I gave her strict return precautions and she says she understands and agrees with the plan and even after I offered to admit her at least 2 more times she declines and says she very much wants to go home and is currently calling for a ride.   [CF]    Clinical Course User Index [CF] Hinda Kehr, MD     ____________________________________________  FINAL CLINICAL IMPRESSION(S) / ED DIAGNOSES  Final diagnoses:  Nausea     MEDICATIONS GIVEN DURING THIS VISIT:  Medications  sodium chloride 0.9 % bolus 500 mL (500 mLs Intravenous New Bag/Given 05/31/19 0523)  haloperidol lactate (HALDOL) injection 1 mg (1 mg Intravenous Given 05/31/19 0521)  droperidol (INAPSINE) 2.5 MG/ML injection 1.25 mg (1.25 mg Intravenous Given 05/31/19 3557)     ED Discharge Orders         Ordered  ondansetron (ZOFRAN ODT) 4 MG disintegrating tablet     05/31/19 0818          *Please note:  Kai Levins Eiben was evaluated in Emergency Department on 05/31/2019 for the symptoms described in the history of present illness. She was evaluated in the context  of the global COVID-19 pandemic, which necessitated consideration that the patient might be at risk for infection with the SARS-CoV-2 virus that causes COVID-19. Institutional protocols and algorithms that pertain to the evaluation of patients at risk for COVID-19 are in a state of rapid change based on information released by regulatory bodies including the CDC and federal and state organizations. These policies and algorithms were followed during the patient's care in the ED.  Some ED evaluations and interventions may be delayed as a result of limited staffing during the pandemic.*  Note:  This document was prepared using Dragon voice recognition software and may include unintentional dictation errors.   Hinda Kehr, MD 05/31/19 [phone]

## 2019-05-31 NOTE — Discharge Instructions (Signed)
Your workup in the Emergency Department today was reassuring.  We did not find any specific abnormalities.  We recommend you drink plenty of fluids, take your regular medications and/or any new ones prescribed today, and follow up with the doctor(s) listed in these documents as recommended.  Return to the Emergency Department if you develop new or worsening symptoms that concern you.  

## 2019-05-31 NOTE — ED Triage Notes (Signed)
Pt to triage via w/c with no distress noted, mask in place; EMS brought pt in from home for c/o nausea (no vomiting) and weakness x 2 days with left jaw pain, nonradiating

## 2019-11-15 ENCOUNTER — Encounter: Payer: Self-pay | Admitting: Emergency Medicine

## 2019-11-15 ENCOUNTER — Ambulatory Visit (INDEPENDENT_AMBULATORY_CARE_PROVIDER_SITE_OTHER): Payer: Medicare Other

## 2019-11-15 ENCOUNTER — Other Ambulatory Visit: Payer: Self-pay

## 2019-11-15 ENCOUNTER — Ambulatory Visit
Admission: EM | Admit: 2019-11-15 | Discharge: 2019-11-15 | Disposition: A | Discharge status: Home / Self Care | Payer: Medicare Other | Attending: Physician Assistant | Admitting: Physician Assistant

## 2019-11-15 DIAGNOSIS — R062 Wheezing: Secondary | ICD-10-CM

## 2019-11-15 DIAGNOSIS — R0981 Nasal congestion: Secondary | ICD-10-CM | POA: Diagnosis not present

## 2019-11-15 DIAGNOSIS — J309 Allergic rhinitis, unspecified: Secondary | ICD-10-CM | POA: Diagnosis not present

## 2019-11-15 DIAGNOSIS — R0602 Shortness of breath: Secondary | ICD-10-CM | POA: Diagnosis not present

## 2019-11-15 DIAGNOSIS — Z20822 Contact with and (suspected) exposure to covid-19: Secondary | ICD-10-CM | POA: Diagnosis not present

## 2019-11-15 DIAGNOSIS — R0982 Postnasal drip: Secondary | ICD-10-CM | POA: Insufficient documentation

## 2019-11-15 LAB — SARS CORONAVIRUS 2 AG (30 MIN TAT): SARS Coronavirus 2 Ag: NEGATIVE

## 2019-11-15 MED ORDER — LORATADINE 10 MG PO TABS: 10.0000 mg | ORAL_TABLET | Freq: Every day | ORAL | 0 refills | Status: AC

## 2019-11-15 MED ORDER — IPRATROPIUM BROMIDE 0.06 % NA SOLN: 2.0000 | Freq: Four times a day (QID) | NASAL | 12 refills | Status: DC

## 2019-11-15 NOTE — ED Triage Notes (Signed)
Pt c/o wheezing, shortness of breath, nasal congestion. Denies fever, or chest pain. She has not had covid vaccines and declines testing today.

## 2019-11-15 NOTE — Discharge Instructions (Addendum)
We have performed a chest x-ray which did not show any pneumonia.  I believe your symptoms are due to allergies, but we have performed a Covid test.  Begin the new nasal spray, increase fluid intake and also start the Claritin as prescribed for you.  If you have any new symptoms including fever, cough, breathing difficulty, wheezing in your chest, weakness or chest pain call EMS or go to emergency room.  If your Covid test is positive, I will call you to discuss infusion therapy.  You have received COVID testing today either for positive exposure, concerning symptoms that could be related to COVID infection, screening purposes, or re-testing after confirmed positive.  Your test obtained today checks for active viral infection in the last 1-2 weeks. If your test is negative now, you can still test positive later. So, if you do develop symptoms you should either get re-tested and/or isolate x 10 days. Please follow CDC guidelines.  While Rapid antigen tests come back in 15-20 minutes, send out PCR/molecular test results typically come back within 24 hours. In the mean time, if you are symptomatic, assume this could be a positive test and treat/monitor yourself as if you do have COVID.   We will call with test results. Please download the MyChart app and set up a profile to access test results.   If symptomatic, go home and rest. Push fluids. Take Tylenol as needed for discomfort. Gargle warm salt water. Throat lozenges. Take Mucinex DM or Robitussin for cough. Humidifier in bedroom to ease coughing. Warm showers. Also review the COVID handout for more information.  COVID-19 INFECTION: The incubation period of COVID-19 is approximately 14 days after exposure, with most symptoms developing in roughly 4-5 days. Symptoms may range in severity from mild to critically severe. Roughly 80% of those infected will have mild symptoms. People of any age may become infected with COVID-19 and have the ability to  transmit the virus. The most common symptoms include: fever, fatigue, cough, body aches, headaches, sore throat, nasal congestion, shortness of breath, nausea, vomiting, diarrhea, changes in smell and/or taste.    COURSE OF ILLNESS Some patients may begin with mild disease which can progress quickly into critical symptoms. If your symptoms are worsening please call ahead to the Emergency Department and proceed there for further treatment. Recovery time appears to be roughly 1-2 weeks for mild symptoms and 3-6 weeks for severe disease.   GO IMMEDIATELY TO ER FOR FEVER YOU ARE UNABLE TO GET DOWN WITH TYLENOL, BREATHING PROBLEMS, CHEST PAIN, FATIGUE, LETHARGY, INABILITY TO EAT OR DRINK, ETC  QUARANTINE AND ISOLATION: To help decrease the spread of COVID-19 please remain isolated if you have COVID infection or are highly suspected to have COVID infection. This means -stay home and isolate to one room in the home if you live with others. Do not share a bed or bathroom with others while ill, sanitize and wipe down all countertops and keep common areas clean and disinfected. You may discontinue isolation if you have a mild case and are asymptomatic 10 days after symptom onset as long as you have been fever free >24 hours without having to take Motrin or Tylenol. If your case is more severe (meaning you develop pneumonia or are admitted in the hospital), you may have to isolate longer.   If you have been in close contact (within 6 feet) of someone diagnosed with COVID 19, you are advised to quarantine in your home for 14 days as symptoms can develop  anywhere from 2-14 days after exposure to the virus. If you develop symptoms, you  must isolate.  Most current guidelines for COVID after exposure -isolate 10 days if you ARE NOT tested for COVID as long as symptoms do not develop -isolate 7 days if you are tested and remain asymptomatic -You do not necessarily need to be tested for COVID if you have + exposure and         develop   symptoms. Just isolate at home x10 days from symptom onset During this global pandemic, CDC advises to practice social distancing, try to stay at least 67ft away from others at all times. Wear a face covering. Wash and sanitize your hands regularly and avoid going anywhere that is not necessary.  KEEP IN MIND THAT THE COVID TEST IS NOT 100% ACCURATE AND YOU SHOULD STILL DO EVERYTHING TO PREVENT POTENTIAL SPREAD OF VIRUS TO OTHERS (WEAR MASK, WEAR GLOVES, [place] HANDS AND SANITIZE REGULARLY). IF INITIAL TEST IS NEGATIVE, THIS MAY NOT MEAN YOU ARE DEFINITELY NEGATIVE. MOST ACCURATE TESTING IS DONE 5-7 DAYS AFTER EXPOSURE.   It is not advised by CDC to get re-tested after receiving a positive COVID test since you can still test positive for weeks to months after you have already cleared the virus.   *If you have not been vaccinated for COVID, I strongly suggest you consider getting vaccinated as long as there are no contraindications.

## 2019-11-15 NOTE — ED Provider Notes (Signed)
[facility]    CSN: [id number] Arrival date & time: 11/15/19  0930      History   Chief Complaint Chief Complaint  Patient presents with  . Wheezing    HPI [name] is a 79 y.o. female presenting for onset of nasal congestion, sore throat and postnasal drainage, and "wheezing through my nose" yesterday.  She denies any fever, body aches, fatigue, weakness, chest pain or tightness, palpitations, cough or breathing difficulty on ambulation.  She states that she feels like she cannot breathe through her nose.  Patient says that she has been using Flonase twice a day and that helps her symptoms.  She says she does not feel that bad, but feels like she needs to use Flonase more than once a day to improve her symptoms and was concerned about that.  Denies any ear pain or sinus pain.  She says that it feels like her ears are "popping."  Patient initially declines Covid testing.  She denies any known exposure.  Patient not vaccinated for Covid.  She had missed a personal history of Covid about a year ago.  Patient has a complicated medical history including 2 NSTEMI's in the past 3 months, with the last NSTEMI 1 month ago.  Patient also has history of congestive heart failure, coronary artery disease, ischemic cardiomyopathy, multiple myeloma not in remission, peripheral artery disease, type 2 diabetes with use of insulin, iron deficiency anemia, and history of stroke.  Patient states that she did see her cardiologist 2 weeks ago and states that she is improving from her recent heart attacks.  She says that she takes Lasix for swelling in her legs and that has been improving.  Patient does not have any other complaints or concerns today.  HPI  Past Medical History:  Diagnosis Date  . Anginal pain ([place])   . Anxiety   . Arthritis    "shoulders primarily" (01/13/2016)  . CAD (coronary artery disease)    s/p CABG in Oct 2017  . CHF (congestive heart failure) ([place])   .  Chronic pain    from her multiple myeloma but says that her pain is usually in her legs/notes 01/13/2016  . High cholesterol   . History of blood transfusion    "numerous; related to procedures for my heart" (01/13/2016)  . Hypertension   . Ischemic cardiomyopathy    /nots 01/13/2016  . MI (myocardial infarction) ([place])    EKG on arrival 01/13/2016 showed NSR with evidence of old anterior and inferior infarcts  . MI (myocardial infarction) ([place]) 08/2015  . Multiple myeloma ([place]) dx'd 2015   off chemo since 2016  . Nausea and vomiting 05/10/2016   3-4 month hx of nausea and vomiting  . PAD (peripheral artery disease) ([place])   . Stroke [facility])    "several in 1 year; ? year" (01/13/2016)  . Type II diabetes mellitus [facility])     Patient Active Problem List   Diagnosis Date Noted  . Hyponatremia 08/27/2018  . Iron deficiency anemia 08/27/2018  . Nausea 08/27/2018  . Gastroenteritis due to COVID-19 virus 08/27/2018  . Severe nausea 10/05/2016  . Nausea & vomiting 10/04/2016  . Fistula 09/30/2016  . Intractable nausea and vomiting 09/18/2016  . Lower extremity edema 05/10/2016  . Ischemic cardiomyopathy 02/23/2016  . Chills 02/23/2016  . Weight loss 02/23/2016  . Unstable angina pectoris ([place]) 01/13/2016  . Chest pain   . PVD (peripheral vascular disease) ([place])   . Coronary artery disease  involving coronary bypass graft of native heart with angina pectoris ([place])   . Peripheral arterial occlusive disease ([place]) 12/15/2015  . Acute blood loss anemia 11/13/2015  . Leukocytosis 11/13/2015  . Chronic pain 11/12/2015  . Acute postoperative pain 11/12/2015  . 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 Acute UTI 11/05/2015  . Edema of left lower extremity 06/16/2015  . Edema of upper extremity 06/16/2015  . Knee pain, left 06/14/2015  . AKI (acute kidney injury) ([place]) 06/11/2015  . Hyperkalemia 06/11/2015  . Hypomagnesemia 06/11/2015  . Acute leg pain, left 05/20/2015  . Multiple bruises 05/20/2015  .  Recurrent urinary tract infection 03/26/2014  . Recurrent UTI 03/26/2014  . H/O gastric bypass 11/07/2013  . Peripheral neuropathy 10/10/2013  . Encounter for antineoplastic chemotherapy 10/10/2013  . Multiple myeloma ([place]) 07/16/2013  . History of critical ischemia of lower extremity 05/15/2013  . Critical lower limb ischemia ([place]) 05/15/2013  . Intermittent claudication ([place]) 07/27/2011  . Chronic coronary artery disease 04/26/2011  . Gastroesophageal reflux disease 04/26/2011  . Type 2 diabetes mellitus ([place]) 03/30/2011  . Hyperlipidemia, unspecified 03/30/2011  . Hypertension 03/30/2011    Past Surgical History:  Procedure Laterality Date  . CARDIAC CATHETERIZATION N/A 01/14/2016   Procedure: Left Heart Cath and Cors/Grafts Angiography;  Surgeon: Burnell Blanks, MD;  Location: [facility];  Service: Cardiovascular;  Laterality: N/A;  . CARPAL TUNNEL RELEASE Left   . CORONARY ANGIOPLASTY WITH STENT PLACEMENT  05/2015   DES placed to the mid and distal LAD as well as OM2/notes 01/13/2016  . CORONARY ANGIOPLASTY WITH STENT PLACEMENT  ~ 2010  . CORONARY ARTERY BYPASS GRAFT  11/11/2015   CABG X2 LIMA-LAD and SVG-OM/notes 01/13/2016  . DILATION AND CURETTAGE OF UTERUS    . FEMORAL-POPLITEAL BYPASS GRAFT Left 2013   Archie Endo 01/13/2016  . LAPAROSCOPIC GASTRIC BANDING  2007  . RENAL ARTERY STENT    . VAGINAL HYSTERECTOMY      OB History   No obstetric history on file.      Home Medications    Prior to Admission medications   Medication Sig Start Date End Date Taking? Authorizing Provider  Alirocumab (PRALUENT) 75 MG/ML SOPN Inject 1 pen into the skin every 14 (fourteen) days.  05/06/16  Yes Dorothy Spark, MD  aspirin 81 MG tablet Take 81 mg by mouth daily.   Yes [provider]  clopidogrel (PLAVIX) 75 MG tablet Take 75 mg by mouth daily.  04/13/17  Yes [provider]  fentaNYL (DURAGESIC) 50 MCG/HR Place 1 patch onto the skin every 3 (three) days.  09/07/18  Yes Ghimire, Henreitta Leber, MD  fluticasone (FLONASE) 50 MCG/ACT nasal spray Place 1 spray into both nostrils as needed. 12/01/18  Yes [provider]  furosemide (LASIX) 20 MG tablet Take 20 mg by mouth daily. 11/15/18  Yes [provider]  gabapentin (NEURONTIN) 300 MG capsule Take 600 mg by mouth See admin instructions. Take 600 mg by mouth in the morning, 600 mg at noontime, and 600 mg at bedtime   Yes [provider]  HYDROmorphone (DILAUDID) 2 MG tablet Take 1 tablet (2 mg total) by mouth every 6 (six) hours as needed (breakthrough pain). 09/07/18  Yes Ghimire, Henreitta Leber, MD  Insulin Glargine (LANTUS SOLOSTAR) 100 UNIT/ML Solostar Pen Inject 28 Units into the skin daily.  06/16/17  Yes [provider]  isosorbide mononitrate (IMDUR) 30 MG 24 hr tablet Take 1 tablet (30 mg total) by mouth daily.  Please make yearly appt with Dr. Meda Coffee for September before anymore refills. 1st attempt 09/07/18  Yes Ghimire, Henreitta Leber, MD  metoprolol tartrate (LOPRESSOR) 25 MG tablet Take 0.5 tablets (12.5 mg total) by mouth 2 (two) times daily. 02/23/16 11/15/19 Yes Dorothy Spark, MD  ondansetron (ZOFRAN ODT) 4 MG disintegrating tablet Allow 1-2 tablets to dissolve in your mouth every 8 hours as needed for nausea/vomiting 05/31/19  Yes Hinda Kehr, MD  tamsulosin (FLOMAX) 0.4 MG CAPS capsule Take 0.4 mg by mouth daily. 11/17/17 01/15/20 Yes [provider]  tolterodine (DETROL LA) 4 MG 24 hr capsule Take 4 mg by mouth daily.   Yes [provider]  Cyanocobalamin 1000 MCG/ML KIT Inject 1,000 mcg as directed every Wednesday.    [provider]  diclofenac sodium (VOLTAREN) 1 % GEL Apply 2 g topically daily as needed (to affected, painful sites).     [provider]  escitalopram (LEXAPRO) 10 MG tablet Take 10 mg by mouth daily.    [provider]  famotidine (PEPCID) 20 MG tablet Take 1 tablet (20 mg total) by mouth daily. Patient not  taking: Reported on 05/07/2019 08/23/18 08/23/19  Merlyn Lot, MD  ipratropium (ATROVENT) 0.06 % nasal spray Place 2 sprays into both nostrils 4 (four) times daily. 11/15/19   Danton Clap, PA-C  loratadine (CLARITIN) 10 MG tablet Take 1 tablet (10 mg total) by mouth daily. 11/15/19 12/15/19  Laurene Footman B, PA-C  magnesium oxide (MAG-OX) 400 MG tablet Take 400 mg by mouth daily as needed (to increase levels).     [provider]  nitroGLYCERIN (NITROSTAT) 0.4 MG SL tablet Place 1 tablet (0.4 mg total) under the tongue every 5 (five) minutes as needed for chest pain. 01/15/16   Arbutus Leas, NP  pantoprazole (PROTONIX) 40 MG tablet Take 1 tablet (40 mg total) by mouth daily at 6 (six) AM. 10/07/16   Geradine Girt, DO  promethazine (PHENERGAN) 12.5 MG tablet Take 1 tablet (12.5 mg total) by mouth every 6 (six) hours. Patient taking differently: Take 12.5 mg by mouth every 6 (six) hours as needed for nausea or vomiting.  01/11/18   Schuyler Amor, MD  Vitamin D, Ergocalciferol, (DRISDOL) 50000 units CAPS capsule Take 50,000 Units by mouth 2 (two) times a week. Mondays and Thursdays 05/06/17   [provider]    Family History Family History  Problem Relation Age of Onset  . Diabetes Mother   . Heart failure Mother   . Prostate cancer Neg Hx   . Kidney cancer Neg Hx   . Bladder Cancer Neg Hx   . Colon cancer Neg Hx   . Stomach cancer Neg Hx     Social History Social History   Tobacco Use  . Smoking status: Never Smoker  . Smokeless tobacco: Never Used  Vaping Use  . Vaping Use: Never used  Substance Use Topics  . Alcohol use: Not Currently    Comment: occasional wine  . Drug use: No     Allergies   Benzodiazepines, Iodinated diagnostic agents, Other, Statins, Ticagrelor, Metoclopramide, Amoxicillin, Codeine, Duloxetine, and Sulfa antibiotics   Review of Systems Review of Systems  Constitutional: Negative for chills, diaphoresis, fatigue and fever.  HENT:  Negative for congestion, ear pain, rhinorrhea, sinus pressure, sinus pain and sore throat.   Respiratory: Positive for shortness of breath and wheezing. Negative for cough.   Gastrointestinal: Negative for abdominal pain, nausea and vomiting.  Musculoskeletal: Negative for arthralgias  and myalgias.  Skin: Negative for rash.  Neurological: Negative for weakness and headaches.  Hematological: Negative for adenopathy.     Physical Exam Triage Vital Signs ED Triage Vitals  Enc Vitals Group     BP 11/15/19 0947 (!) 165/65     Pulse Rate 11/15/19 0947 84     Resp 11/15/19 0947 18     Temp 11/15/19 0947 98.4 F (36.9 C)     Temp Source 11/15/19 0947 Oral     SpO2 11/15/19 0947 98 %     Weight 11/15/19 0942 149 lb 14.6 oz (68 kg)     Height 11/15/19 0942 _0  (1.575 m)     Head Circumference --      Peak Flow --      Pain Score 11/15/19 0942 0     Pain Loc --      Pain Edu? --      Excl. in [place]? --    No data found.  Updated Vital Signs BP (!) 165/65 (BP Location: Left Arm)   Pulse 84   Temp 98.4 F (36.9 C) (Oral)   Resp 18   Ht _1  (1.575 m)   Wt 149 lb 14.6 oz (68 kg)   SpO2 98%   BMI 27.42 kg/m       Physical Exam Vitals and nursing note reviewed.  Constitutional:      General: She is not in acute distress.    Appearance: Normal appearance. She is not ill-appearing or toxic-appearing.  HENT:     Head: Normocephalic and atraumatic.     Right Ear: Tympanic membrane, ear canal and external ear normal.     Left Ear: Tympanic membrane, ear canal and external ear normal.     Nose: Congestion and rhinorrhea present.     Mouth/Throat:     Mouth: Mucous membranes are moist.     Pharynx: Oropharynx is clear. Posterior oropharyngeal erythema (mild with clear PND) present.  Eyes:     General: No scleral icterus.       Right eye: No discharge.        Left eye: No discharge.     Conjunctiva/sclera: Conjunctivae normal.  Cardiovascular:     Rate and Rhythm: Normal rate  and regular rhythm.     Heart sounds: Normal heart sounds.  Pulmonary:     Effort: Pulmonary effort is normal. No respiratory distress.     Breath sounds: Normal breath sounds. No wheezing, rhonchi or rales.  Musculoskeletal:     Cervical back: Neck supple.     Right lower leg: Edema (mild pitting edema to ankle bilaterally) present.     Left lower leg: Edema present.  Skin:    General: Skin is dry.  Neurological:     General: No focal deficit present.     Mental Status: She is alert. Mental status is at baseline.     Motor: No weakness.     Gait: Gait normal.  Psychiatric:        Mood and Affect: Mood normal.        Behavior: Behavior normal.        Thought Content: Thought content normal.      UC Treatments / Results  Labs (all labs ordered are listed, but only abnormal results are displayed) Labs Reviewed  SARS CORONAVIRUS 2 AG (30 MIN TAT)    EKG   Radiology DG Chest 2 View  Result Date: 11/15/2019 CLINICAL DATA:  Wheezing shortness of breath. EXAM:  CHEST - 2 VIEW COMPARISON:  08/26/2018 FINDINGS: Interval borderline enlargement of the cardiac silhouette. Mildly prominent interstitial markings with minimal bilateral pleural fluid. Diffuse osteopenia. Gastric band. IMPRESSION: Interval borderline cardiomegaly and minimal changes of congestive heart failure. Electronically Signed   By: Claudie Revering M.D.   On: 11/15/2019 10:21    Procedures Procedures (including critical care time)  Medications Ordered in UC Medications - No data to display  Initial Impression / Assessment and Plan / UC Course  I have reviewed the triage vital signs and the nursing notes.  Pertinent labs & imaging results that were available during my care of the patient were reviewed by me and considered in my medical decision making (see chart for details).   79 year old female with complicated past medical history presenting for nasal congestion, postnasal drainage and complaint of wheezing  since yesterday.  Due to complaint of wheezing, chest x-ray obtained which did not reveal any signs of pneumonia and minimal changes of CHF which patient is known to have.  She follows up with cardiology regularly.  Upon further discussion with patient, she explains that the wheezing is in her nose.  Her symptoms are improved with Flonase and she does admit a history of allergies.  Vital signs are stable but blood pressure is elevated at 165/65.  Oxygen is 98%.  Her chest is clear to auscultation.  Advised patient to have Covid testing.  Rapid Covid testing obtained today.  Patient stated that she did not want to wait for results so I explained that I would give her a call.  Explained that if she is Covid positive, we will refer her to the infusion therapy clinic for MAB treatment.  CDC guidelines, isolation protocol and ED precautions discussed if Covid positive.  Treating patient for suspected allergic rhinitis with Atrovent nasal spray and Claritin.  Advised her to increase fluids and rest.  ED precautions discussed for any worsening complaints including fever, cough, weakness, chest pain, palpitations, breathing difficulty, increased leg swelling.  Patient understanding and agreeable.  *Covid testing negative.  Discussed results with patient.  Final Clinical Impressions(s) / UC Diagnoses   Final diagnoses:  Allergic rhinitis, unspecified seasonality, unspecified trigger  Nasal congestion  Post-nasal drainage     Discharge Instructions     We have performed a chest x-ray which did not show any pneumonia.  I believe your symptoms are due to allergies, but we have performed a Covid test.  Begin the new nasal spray, increase fluid intake and also start the Claritin as prescribed for you.  If you have any new symptoms including fever, cough, breathing difficulty, wheezing in your chest, weakness or chest pain call EMS or go to emergency room.  If your Covid test is positive, I will call you to  discuss infusion therapy.  You have received COVID testing today either for positive exposure, concerning symptoms that could be related to COVID infection, screening purposes, or re-testing after confirmed positive.  Your test obtained today checks for active viral infection in the last 1-2 weeks. If your test is negative now, you can still test positive later. So, if you do develop symptoms you should either get re-tested and/or isolate x 10 days. Please follow CDC guidelines.  While Rapid antigen tests come back in 15-20 minutes, send out PCR/molecular test results typically come back within 24 hours. In the mean time, if you are symptomatic, assume this could be a positive test and treat/monitor yourself as if you do have COVID.  We will call with test results. Please download the MyChart app and set up a profile to access test results.   If symptomatic, go home and rest. Push fluids. Take Tylenol as needed for discomfort. Gargle warm salt water. Throat lozenges. Take Mucinex DM or Robitussin for cough. Humidifier in bedroom to ease coughing. Warm showers. Also review the COVID handout for more information.  COVID-19 INFECTION: The incubation period of COVID-19 is approximately 14 days after exposure, with most symptoms developing in roughly 4-5 days. Symptoms may range in severity from mild to critically severe. Roughly 80% of those infected will have mild symptoms. People of any age may become infected with COVID-19 and have the ability to transmit the virus. The most common symptoms include: fever, fatigue, cough, body aches, headaches, sore throat, nasal congestion, shortness of breath, nausea, vomiting, diarrhea, changes in smell and/or taste.    COURSE OF ILLNESS Some patients may begin with mild disease which can progress quickly into critical symptoms. If your symptoms are worsening please call ahead to the Emergency Department and proceed there for further treatment. Recovery time appears  to be roughly 1-2 weeks for mild symptoms and 3-6 weeks for severe disease.   GO IMMEDIATELY TO ER FOR FEVER YOU ARE UNABLE TO GET DOWN WITH TYLENOL, BREATHING PROBLEMS, CHEST PAIN, FATIGUE, LETHARGY, INABILITY TO EAT OR DRINK, ETC  QUARANTINE AND ISOLATION: To help decrease the spread of COVID-19 please remain isolated if you have COVID infection or are highly suspected to have COVID infection. This means -stay home and isolate to one room in the home if you live with others. Do not share a bed or bathroom with others while ill, sanitize and wipe down all countertops and keep common areas clean and disinfected. You may discontinue isolation if you have a mild case and are asymptomatic 10 days after symptom onset as long as you have been fever free >24 hours without having to take Motrin or Tylenol. If your case is more severe (meaning you develop pneumonia or are admitted in the hospital), you may have to isolate longer.   If you have been in close contact (within 6 feet) of someone diagnosed with COVID 19, you are advised to quarantine in your home for 14 days as symptoms can develop anywhere from 2-14 days after exposure to the virus. If you develop symptoms, you  must isolate.  Most current guidelines for COVID after exposure -isolate 10 days if you ARE NOT tested for COVID as long as symptoms do not develop -isolate 7 days if you are tested and remain asymptomatic -You do not necessarily need to be tested for COVID if you have + exposure and        develop   symptoms. Just isolate at home x10 days from symptom onset During this global pandemic, CDC advises to practice social distancing, try to stay at least 55f away from others at all times. Wear a face covering. Wash and sanitize your hands regularly and avoid going anywhere that is not necessary.  KEEP IN MIND THAT THE COVID TEST IS NOT 100% ACCURATE AND YOU SHOULD STILL DO EVERYTHING TO PREVENT POTENTIAL SPREAD OF VIRUS TO OTHERS (WEAR MASK,  WEAR GLOVES, WKeshenaHANDS AND SANITIZE REGULARLY). IF INITIAL TEST IS NEGATIVE, THIS MAY NOT MEAN YOU ARE DEFINITELY NEGATIVE. MOST ACCURATE TESTING IS DONE 5-7 DAYS AFTER EXPOSURE.   It is not advised by CDC to get re-tested after receiving a positive COVID test since you can still test positive  for weeks to months after you have already cleared the virus.   *If you have not been vaccinated for COVID, I strongly suggest you consider getting vaccinated as long as there are no contraindications.      ED Prescriptions    Medication Sig Dispense Auth. Provider   ipratropium (ATROVENT) 0.06 % nasal spray Place 2 sprays into both nostrils 4 (four) times daily. 15 mL Laurene Footman B, PA-C   loratadine (CLARITIN) 10 MG tablet Take 1 tablet (10 mg total) by mouth daily. 30 tablet Gretta Cool     PDMP not reviewed this encounter.   Danton Clap, PA-C 11/15/19 1133

## 2019-12-12 ENCOUNTER — Other Ambulatory Visit: Payer: Self-pay | Admitting: Cardiology

## 2020-02-02 ENCOUNTER — Emergency Department: Payer: Medicare Other

## 2020-02-02 ENCOUNTER — Other Ambulatory Visit: Payer: Self-pay

## 2020-02-02 ENCOUNTER — Emergency Department
Admission: EM | Admit: 2020-02-02 | Discharge: 2020-02-02 | Disposition: A | Discharge status: Home / Self Care | Payer: Medicare Other | Attending: Emergency Medicine | Admitting: Emergency Medicine

## 2020-02-02 DIAGNOSIS — Z7982 Long term (current) use of aspirin: Secondary | ICD-10-CM | POA: Diagnosis not present

## 2020-02-02 DIAGNOSIS — M25552 Pain in left hip: Secondary | ICD-10-CM

## 2020-02-02 DIAGNOSIS — Z951 Presence of aortocoronary bypass graft: Secondary | ICD-10-CM | POA: Diagnosis not present

## 2020-02-02 DIAGNOSIS — W07XXXA Fall from chair, initial encounter: Secondary | ICD-10-CM | POA: Diagnosis not present

## 2020-02-02 DIAGNOSIS — I25110 Atherosclerotic heart disease of native coronary artery with unstable angina pectoris: Secondary | ICD-10-CM | POA: Insufficient documentation

## 2020-02-02 DIAGNOSIS — I10 Essential (primary) hypertension: Secondary | ICD-10-CM | POA: Insufficient documentation

## 2020-02-02 DIAGNOSIS — N3001 Acute cystitis with hematuria: Secondary | ICD-10-CM

## 2020-02-02 DIAGNOSIS — Z7902 Long term (current) use of antithrombotics/antiplatelets: Secondary | ICD-10-CM | POA: Insufficient documentation

## 2020-02-02 DIAGNOSIS — R0789 Other chest pain: Secondary | ICD-10-CM | POA: Diagnosis not present

## 2020-02-02 DIAGNOSIS — R079 Chest pain, unspecified: Secondary | ICD-10-CM

## 2020-02-02 DIAGNOSIS — Z794 Long term (current) use of insulin: Secondary | ICD-10-CM | POA: Diagnosis not present

## 2020-02-02 DIAGNOSIS — Z79899 Other long term (current) drug therapy: Secondary | ICD-10-CM | POA: Diagnosis not present

## 2020-02-02 DIAGNOSIS — Z955 Presence of coronary angioplasty implant and graft: Secondary | ICD-10-CM | POA: Insufficient documentation

## 2020-02-02 DIAGNOSIS — E1140 Type 2 diabetes mellitus with diabetic neuropathy, unspecified: Secondary | ICD-10-CM | POA: Insufficient documentation

## 2020-02-02 DIAGNOSIS — M25572 Pain in left ankle and joints of left foot: Secondary | ICD-10-CM | POA: Insufficient documentation

## 2020-02-02 DIAGNOSIS — I6782 Cerebral ischemia: Secondary | ICD-10-CM | POA: Insufficient documentation

## 2020-02-02 DIAGNOSIS — W19XXXA Unspecified fall, initial encounter: Secondary | ICD-10-CM

## 2020-02-02 DIAGNOSIS — R4182 Altered mental status, unspecified: Secondary | ICD-10-CM | POA: Insufficient documentation

## 2020-02-02 LAB — BASIC METABOLIC PANEL
Anion gap: 8 (ref 5–15)
BUN: 29 mg/dL — ABNORMAL HIGH (ref 8–23)
CO2: 30 mmol/L (ref 22–32)
Calcium: 9 mg/dL (ref 8.9–10.3)
Chloride: 96 mmol/L — ABNORMAL LOW (ref 98–111)
Creatinine, Ser: 1.82 mg/dL — ABNORMAL HIGH (ref 0.44–1.00)
GFR, Estimated: 28 mL/min — ABNORMAL LOW (ref >60)
Glucose, Bld: 77 mg/dL (ref 70–99)
Potassium: 4.6 mmol/L (ref 3.5–5.1)
Sodium: 134 mmol/L — ABNORMAL LOW (ref 135–145)

## 2020-02-02 LAB — CBC
HCT: 30 % — ABNORMAL LOW (ref 36.0–46.0)
Hemoglobin: 9.4 g/dL — ABNORMAL LOW (ref 12.0–15.0)
MCH: 28.3 pg (ref 26.0–34.0)
MCHC: 31.3 g/dL (ref 30.0–36.0)
MCV: 90.4 fL (ref 80.0–100.0)
Platelets: 113 10*3/uL — ABNORMAL LOW (ref 150–400)
RBC: 3.32 MIL/uL — ABNORMAL LOW (ref 3.87–5.11)
RDW: 17.6 % — ABNORMAL HIGH (ref 11.5–15.5)
WBC: 6.1 10*3/uL (ref 4.0–10.5)
nRBC: 0 % (ref 0.0–0.2)

## 2020-02-02 LAB — URINALYSIS, COMPLETE (UACMP) WITH MICROSCOPIC
Bilirubin Urine: NEGATIVE
Glucose, UA: NEGATIVE mg/dL
Hgb urine dipstick: NEGATIVE
Ketones, ur: NEGATIVE mg/dL
Nitrite: NEGATIVE
Protein, ur: NEGATIVE mg/dL
Specific Gravity, Urine: 1.014 (ref 1.005–1.030)
pH: 7 (ref 5.0–8.0)

## 2020-02-02 MED ORDER — NITROFURANTOIN MONOHYD MACRO 100 MG PO CAPS: 100.0000 mg | ORAL_CAPSULE | Freq: Two times a day (BID) | ORAL | 0 refills | Status: AC

## 2020-02-02 NOTE — ED Notes (Signed)
Patient transported to CT 

## 2020-02-02 NOTE — ED Triage Notes (Signed)
Pt also c/o lower back pain. Skin tear noted by EMS to left arm. Currently dressed with gauze.

## 2020-02-02 NOTE — ED Notes (Signed)
Pt returned from radiology; skin tear to left elbow redressed as drsg was soaked with blood; tear is noted to be mostly controlled bleeding at this time.
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
## 2020-02-02 NOTE — ED Notes (Signed)
Called [facility] for transport to [address] [place]
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
## 2020-02-02 NOTE — ED Notes (Signed)
Skin tear on left elbow again bleeding through dressing; steristrip placed to distal portion of skin tear which seems to have the bleeding spot; pressure dressing applied

## 2020-03-11 ENCOUNTER — Inpatient Hospital Stay
Admission: EM | Admit: 2020-03-11 | Discharge: 2020-04-11 | DRG: 871 | Disposition: E | Payer: Medicare Other | Source: Skilled Nursing Facility | Attending: Internal Medicine | Admitting: Internal Medicine

## 2020-03-11 ENCOUNTER — Emergency Department: Payer: Medicare Other

## 2020-03-11 ENCOUNTER — Other Ambulatory Visit: Payer: Self-pay

## 2020-03-11 ENCOUNTER — Encounter: Payer: Self-pay | Admitting: Internal Medicine

## 2020-03-11 DIAGNOSIS — E860 Dehydration: Secondary | ICD-10-CM | POA: Diagnosis present

## 2020-03-11 DIAGNOSIS — Z9884 Bariatric surgery status: Secondary | ICD-10-CM

## 2020-03-11 DIAGNOSIS — R40243 Glasgow coma scale score 3-8, unspecified time: Secondary | ICD-10-CM

## 2020-03-11 DIAGNOSIS — E1151 Type 2 diabetes mellitus with diabetic peripheral angiopathy without gangrene: Secondary | ICD-10-CM | POA: Diagnosis present

## 2020-03-11 DIAGNOSIS — I2510 Atherosclerotic heart disease of native coronary artery without angina pectoris: Secondary | ICD-10-CM | POA: Diagnosis present

## 2020-03-11 DIAGNOSIS — K429 Umbilical hernia without obstruction or gangrene: Secondary | ICD-10-CM | POA: Diagnosis present

## 2020-03-11 DIAGNOSIS — Z7902 Long term (current) use of antithrombotics/antiplatelets: Secondary | ICD-10-CM

## 2020-03-11 DIAGNOSIS — R54 Age-related physical debility: Secondary | ICD-10-CM | POA: Diagnosis present

## 2020-03-11 DIAGNOSIS — G8929 Other chronic pain: Secondary | ICD-10-CM | POA: Diagnosis present

## 2020-03-11 DIAGNOSIS — Z882 Allergy status to sulfonamides status: Secondary | ICD-10-CM

## 2020-03-11 DIAGNOSIS — G939 Disorder of brain, unspecified: Secondary | ICD-10-CM | POA: Diagnosis not present

## 2020-03-11 DIAGNOSIS — A419 Sepsis, unspecified organism: Principal | ICD-10-CM

## 2020-03-11 DIAGNOSIS — J9602 Acute respiratory failure with hypercapnia: Secondary | ICD-10-CM | POA: Diagnosis present

## 2020-03-11 DIAGNOSIS — Z833 Family history of diabetes mellitus: Secondary | ICD-10-CM

## 2020-03-11 DIAGNOSIS — R0603 Acute respiratory distress: Secondary | ICD-10-CM | POA: Diagnosis not present

## 2020-03-11 DIAGNOSIS — N1831 Chronic kidney disease, stage 3a: Secondary | ICD-10-CM | POA: Diagnosis present

## 2020-03-11 DIAGNOSIS — Z66 Do not resuscitate: Secondary | ICD-10-CM | POA: Diagnosis present

## 2020-03-11 DIAGNOSIS — N170 Acute kidney failure with tubular necrosis: Secondary | ICD-10-CM | POA: Diagnosis present

## 2020-03-11 DIAGNOSIS — C9000 Multiple myeloma not having achieved remission: Secondary | ICD-10-CM | POA: Diagnosis present

## 2020-03-11 DIAGNOSIS — Z955 Presence of coronary angioplasty implant and graft: Secondary | ICD-10-CM

## 2020-03-11 DIAGNOSIS — J9601 Acute respiratory failure with hypoxia: Secondary | ICD-10-CM | POA: Diagnosis present

## 2020-03-11 DIAGNOSIS — G928 Other toxic encephalopathy: Secondary | ICD-10-CM | POA: Diagnosis present

## 2020-03-11 DIAGNOSIS — I130 Hypertensive heart and chronic kidney disease with heart failure and stage 1 through stage 4 chronic kidney disease, or unspecified chronic kidney disease: Secondary | ICD-10-CM | POA: Diagnosis present

## 2020-03-11 DIAGNOSIS — I252 Old myocardial infarction: Secondary | ICD-10-CM

## 2020-03-11 DIAGNOSIS — Z20822 Contact with and (suspected) exposure to covid-19: Secondary | ICD-10-CM | POA: Diagnosis present

## 2020-03-11 DIAGNOSIS — R5081 Fever presenting with conditions classified elsewhere: Secondary | ICD-10-CM | POA: Diagnosis present

## 2020-03-11 DIAGNOSIS — C9110 Chronic lymphocytic leukemia of B-cell type not having achieved remission: Secondary | ICD-10-CM

## 2020-03-11 DIAGNOSIS — E1122 Type 2 diabetes mellitus with diabetic chronic kidney disease: Secondary | ICD-10-CM | POA: Diagnosis present

## 2020-03-11 DIAGNOSIS — Z7189 Other specified counseling: Secondary | ICD-10-CM | POA: Diagnosis not present

## 2020-03-11 DIAGNOSIS — R6521 Severe sepsis with septic shock: Secondary | ICD-10-CM | POA: Diagnosis present

## 2020-03-11 DIAGNOSIS — J690 Pneumonitis due to inhalation of food and vomit: Secondary | ICD-10-CM | POA: Diagnosis present

## 2020-03-11 DIAGNOSIS — D709 Neutropenia, unspecified: Secondary | ICD-10-CM | POA: Diagnosis present

## 2020-03-11 DIAGNOSIS — Z951 Presence of aortocoronary bypass graft: Secondary | ICD-10-CM | POA: Diagnosis not present

## 2020-03-11 DIAGNOSIS — Z880 Allergy status to penicillin: Secondary | ICD-10-CM | POA: Diagnosis not present

## 2020-03-11 DIAGNOSIS — N390 Urinary tract infection, site not specified: Secondary | ICD-10-CM | POA: Diagnosis present

## 2020-03-11 DIAGNOSIS — E7800 Pure hypercholesterolemia, unspecified: Secondary | ICD-10-CM | POA: Diagnosis present

## 2020-03-11 DIAGNOSIS — R197 Diarrhea, unspecified: Secondary | ICD-10-CM | POA: Diagnosis present

## 2020-03-11 DIAGNOSIS — I255 Ischemic cardiomyopathy: Secondary | ICD-10-CM | POA: Diagnosis present

## 2020-03-11 DIAGNOSIS — J9690 Respiratory failure, unspecified, unspecified whether with hypoxia or hypercapnia: Secondary | ICD-10-CM | POA: Diagnosis not present

## 2020-03-11 DIAGNOSIS — N179 Acute kidney failure, unspecified: Secondary | ICD-10-CM

## 2020-03-11 DIAGNOSIS — Z515 Encounter for palliative care: Secondary | ICD-10-CM

## 2020-03-11 DIAGNOSIS — E876 Hypokalemia: Secondary | ICD-10-CM | POA: Diagnosis not present

## 2020-03-11 DIAGNOSIS — Z9221 Personal history of antineoplastic chemotherapy: Secondary | ICD-10-CM | POA: Diagnosis not present

## 2020-03-11 DIAGNOSIS — Z8673 Personal history of transient ischemic attack (TIA), and cerebral infarction without residual deficits: Secondary | ICD-10-CM

## 2020-03-11 DIAGNOSIS — R402432 Glasgow coma scale score 3-8, at arrival to emergency department: Secondary | ICD-10-CM | POA: Diagnosis not present

## 2020-03-11 DIAGNOSIS — F419 Anxiety disorder, unspecified: Secondary | ICD-10-CM | POA: Diagnosis present

## 2020-03-11 DIAGNOSIS — I509 Heart failure, unspecified: Secondary | ICD-10-CM | POA: Diagnosis present

## 2020-03-11 DIAGNOSIS — C9002 Multiple myeloma in relapse: Secondary | ICD-10-CM

## 2020-03-11 DIAGNOSIS — E872 Acidosis: Secondary | ICD-10-CM | POA: Diagnosis present

## 2020-03-11 DIAGNOSIS — R4182 Altered mental status, unspecified: Secondary | ICD-10-CM | POA: Diagnosis present

## 2020-03-11 DIAGNOSIS — Z79899 Other long term (current) drug therapy: Secondary | ICD-10-CM

## 2020-03-11 DIAGNOSIS — Z885 Allergy status to narcotic agent status: Secondary | ICD-10-CM | POA: Diagnosis not present

## 2020-03-11 DIAGNOSIS — Z794 Long term (current) use of insulin: Secondary | ICD-10-CM

## 2020-03-11 DIAGNOSIS — Z8249 Family history of ischemic heart disease and other diseases of the circulatory system: Secondary | ICD-10-CM

## 2020-03-11 DIAGNOSIS — Z888 Allergy status to other drugs, medicaments and biological substances status: Secondary | ICD-10-CM

## 2020-03-11 LAB — PROTIME-INR
INR: 1.3 — ABNORMAL HIGH (ref 0.8–1.2)
Prothrombin Time: 16 seconds — ABNORMAL HIGH (ref 11.4–15.2)

## 2020-03-11 LAB — BLOOD GAS, VENOUS
Acid-base deficit: 11.6 mmol/L — ABNORMAL HIGH (ref 0.0–2.0)
Bicarbonate: 16.4 mmol/L — ABNORMAL LOW (ref 20.0–28.0)
O2 Saturation: 55.2 %
Patient temperature: 37
pCO2, Ven: 45 mmHg (ref 44.0–60.0)
pH, Ven: 7.17 — CL (ref 7.250–7.430)
pO2, Ven: 38 mmHg (ref 32.0–45.0)

## 2020-03-11 LAB — BLOOD GAS, ARTERIAL
Acid-base deficit: 3.8 mmol/L — ABNORMAL HIGH (ref 0.0–2.0)
Bicarbonate: 20.4 mmol/L (ref 20.0–28.0)
FIO2: 0.6
MECHVT: 450 mL
O2 Saturation: 99 %
PEEP: 5 cmH2O
Patient temperature: 37
RATE: 16 resp/min
pCO2 arterial: 33 mmHg (ref 32.0–48.0)
pH, Arterial: 7.4 (ref 7.350–7.450)
pO2, Arterial: 133 mmHg — ABNORMAL HIGH (ref 83.0–108.0)

## 2020-03-11 LAB — URINALYSIS, COMPLETE (UACMP) WITH MICROSCOPIC
Bilirubin Urine: NEGATIVE
Glucose, UA: NEGATIVE mg/dL
Ketones, ur: NEGATIVE mg/dL
Nitrite: NEGATIVE
Protein, ur: 100 mg/dL — AB
Specific Gravity, Urine: 1.017 (ref 1.005–1.030)
pH: 5 (ref 5.0–8.0)

## 2020-03-11 LAB — LACTIC ACID, PLASMA
Lactic Acid, Venous: 8.6 mmol/L (ref 0.5–1.9)
Lactic Acid, Venous: 5.3 mmol/L (ref 0.5–1.9)
Lactic Acid, Venous: 6 mmol/L (ref 0.5–1.9)

## 2020-03-11 LAB — GLUCOSE, CAPILLARY
Glucose-Capillary: 157 mg/dL — ABNORMAL HIGH (ref 70–99)
Glucose-Capillary: 230 mg/dL — ABNORMAL HIGH (ref 70–99)
Glucose-Capillary: 263 mg/dL — ABNORMAL HIGH (ref 70–99)

## 2020-03-11 LAB — RESP PANEL BY RT-PCR (FLU A&B, COVID) ARPGX2
Influenza A by PCR: NEGATIVE
Influenza B by PCR: NEGATIVE
SARS Coronavirus 2 by RT PCR: NEGATIVE

## 2020-03-11 LAB — MAGNESIUM: Magnesium: 1.2 mg/dL — ABNORMAL LOW (ref 1.7–2.4)

## 2020-03-11 LAB — COMPREHENSIVE METABOLIC PANEL
ALT: 64 U/L — ABNORMAL HIGH (ref 0–44)
AST: 156 U/L — ABNORMAL HIGH (ref 15–41)
Albumin: 2.9 g/dL — ABNORMAL LOW (ref 3.5–5.0)
Alkaline Phosphatase: 61 U/L (ref 38–126)
Anion gap: 18 — ABNORMAL HIGH (ref 5–15)
BUN: 56 mg/dL — ABNORMAL HIGH (ref 8–23)
CO2: 16 mmol/L — ABNORMAL LOW (ref 22–32)
Calcium: >15 mg/dL (ref 8.9–10.3)
Chloride: 113 mmol/L — ABNORMAL HIGH (ref 98–111)
Creatinine, Ser: 3.01 mg/dL — ABNORMAL HIGH (ref 0.44–1.00)
GFR, Estimated: 15 mL/min — ABNORMAL LOW (ref >60)
Glucose, Bld: 259 mg/dL — ABNORMAL HIGH (ref 70–99)
Potassium: 2.8 mmol/L — ABNORMAL LOW (ref 3.5–5.1)
Sodium: 147 mmol/L — ABNORMAL HIGH (ref 135–145)
Total Bilirubin: 1.2 mg/dL (ref 0.3–1.2)
Total Protein: 7.1 g/dL (ref 6.5–8.1)

## 2020-03-11 LAB — CBC WITH DIFFERENTIAL/PLATELET
Abs Immature Granulocytes: 0 10*3/uL (ref 0.00–0.07)
Basophils Absolute: 0 10*3/uL (ref 0.0–0.1)
Basophils Relative: 0 %
Eosinophils Absolute: 0 10*3/uL (ref 0.0–0.5)
Eosinophils Relative: 0 %
HCT: 40.2 % (ref 36.0–46.0)
Hemoglobin: 11.8 g/dL — ABNORMAL LOW (ref 12.0–15.0)
Immature Granulocytes: 0 %
Lymphocytes Relative: 77 %
Lymphs Abs: 4.3 10*3/uL — ABNORMAL HIGH (ref 0.7–4.0)
MCH: 28.6 pg (ref 26.0–34.0)
MCHC: 29.4 g/dL — ABNORMAL LOW (ref 30.0–36.0)
MCV: 97.6 fL (ref 80.0–100.0)
Monocytes Absolute: 0.6 10*3/uL (ref 0.1–1.0)
Monocytes Relative: 11 %
Neutro Abs: 0.7 10*3/uL — ABNORMAL LOW (ref 1.7–7.7)
Neutrophils Relative %: 12 %
Platelets: 127 10*3/uL — ABNORMAL LOW (ref 150–400)
RBC: 4.12 MIL/uL (ref 3.87–5.11)
RDW: 18 % — ABNORMAL HIGH (ref 11.5–15.5)
Smear Review: NORMAL
WBC: 5.6 10*3/uL (ref 4.0–10.5)
nRBC: 1.6 % — ABNORMAL HIGH (ref 0.0–0.2)

## 2020-03-11 LAB — POTASSIUM: Potassium: 2.4 mmol/L — CL (ref 3.5–5.1)

## 2020-03-11 LAB — HEMOGLOBIN A1C
Hgb A1c MFr Bld: 6.2 % — ABNORMAL HIGH (ref 4.8–5.6)
Mean Plasma Glucose: 131.24 mg/dL

## 2020-03-11 LAB — MRSA PCR SCREENING: MRSA by PCR: NEGATIVE

## 2020-03-11 LAB — PHOSPHORUS: Phosphorus: 3.5 mg/dL (ref 2.5–4.6)

## 2020-03-11 LAB — PATHOLOGIST SMEAR REVIEW

## 2020-03-11 MED ORDER — SODIUM BICARBONATE 8.4 % IV SOLN
150.0000 meq | Freq: Once | INTRAVENOUS | Status: AC
  Administered 2020-03-11: 150 meq via INTRAVENOUS
  Filled 2020-03-11: qty 150

## 2020-03-11 MED ORDER — PNEUMOCOCCAL VAC POLYVALENT 25 MCG/0.5ML IJ INJ: 0.5000 mL | INJECTION | INTRAMUSCULAR | Status: DC

## 2020-03-11 MED ORDER — INSULIN ASPART 100 UNIT/ML ~~LOC~~ SOLN
0.0000 [IU] | SUBCUTANEOUS | Status: DC
  Administered 2020-03-11: 8 [IU] via SUBCUTANEOUS
  Administered 2020-03-11: 5 [IU] via SUBCUTANEOUS
  Administered 2020-03-12: 3 [IU] via SUBCUTANEOUS
  Administered 2020-03-12: 2 [IU] via SUBCUTANEOUS
  Administered 2020-03-12 – 2020-03-13 (×3): 3 [IU] via SUBCUTANEOUS
  Administered 2020-03-13: 2 [IU] via SUBCUTANEOUS
  Administered 2020-03-14 (×2): 5 [IU] via SUBCUTANEOUS
  Administered 2020-03-14: 3 [IU] via SUBCUTANEOUS
  Administered 2020-03-14: 2 [IU] via SUBCUTANEOUS
  Filled 2020-03-11 (×11): qty 1

## 2020-03-11 MED ORDER — POLYETHYLENE GLYCOL 3350 17 G PO PACK: 17.0000 g | PACK | Freq: Every day | ORAL | Status: DC | PRN

## 2020-03-11 MED ORDER — SODIUM CHLORIDE 0.9 % IV SOLN: 250.0000 mL | INTRAVENOUS | Status: DC | PRN

## 2020-03-11 MED ORDER — SODIUM CHLORIDE 0.9 % IV BOLUS (SEPSIS)
1000.0000 mL | Freq: Once | INTRAVENOUS | Status: AC
  Administered 2020-03-11: 1000 mL via INTRAVENOUS

## 2020-03-11 MED ORDER — ZOLEDRONIC ACID 4 MG/5ML IV CONC
4.0000 mg | Freq: Once | INTRAVENOUS | Status: AC
  Administered 2020-03-11: 4 mg via INTRAVENOUS
  Filled 2020-03-11: qty 5

## 2020-03-11 MED ORDER — VANCOMYCIN HCL IN DEXTROSE 1-5 GM/200ML-% IV SOLN
1000.0000 mg | Freq: Once | INTRAVENOUS | Status: DC
  Filled 2020-03-11: qty 200

## 2020-03-11 MED ORDER — ORAL CARE MOUTH RINSE
15.0000 mL | OROMUCOSAL | Status: DC
  Administered 2020-03-11 – 2020-03-15 (×27): 15 mL via OROMUCOSAL

## 2020-03-11 MED ORDER — LACTATED RINGERS IV BOLUS
1000.0000 mL | Freq: Once | INTRAVENOUS | Status: AC
  Administered 2020-03-11: 1000 mL via INTRAVENOUS

## 2020-03-11 MED ORDER — MAGNESIUM SULFATE 2 GM/50ML IV SOLN
2.0000 g | Freq: Once | INTRAVENOUS | Status: AC
  Administered 2020-03-12: 2 g via INTRAVENOUS
  Filled 2020-03-11: qty 50

## 2020-03-11 MED ORDER — LACTATED RINGERS IV SOLN: INTRAVENOUS | Status: AC

## 2020-03-11 MED ORDER — ONDANSETRON HCL 4 MG/2ML IJ SOLN: 4.0000 mg | Freq: Four times a day (QID) | INTRAMUSCULAR | Status: DC | PRN

## 2020-03-11 MED ORDER — SODIUM CHLORIDE 0.9 % IV SOLN
2.0000 g | Freq: Once | INTRAVENOUS | Status: AC
  Administered 2020-03-11: 2 g via INTRAVENOUS
  Filled 2020-03-11: qty 2

## 2020-03-11 MED ORDER — SODIUM CHLORIDE 0.9% FLUSH: 3.0000 mL | INTRAVENOUS | Status: DC | PRN

## 2020-03-11 MED ORDER — ETOMIDATE 2 MG/ML IV SOLN
20.0000 mg | Freq: Once | INTRAVENOUS | Status: AC
  Administered 2020-03-11: 20 mg via INTRAVENOUS

## 2020-03-11 MED ORDER — FAMOTIDINE IN NACL 20-0.9 MG/50ML-% IV SOLN
20.0000 mg | INTRAVENOUS | Status: DC
  Administered 2020-03-12 – 2020-03-14 (×3): 20 mg via INTRAVENOUS
  Filled 2020-03-11 (×4): qty 50

## 2020-03-11 MED ORDER — POTASSIUM CHLORIDE 20 MEQ PO PACK
40.0000 meq | PACK | Freq: Once | ORAL | Status: AC
  Administered 2020-03-11: 40 meq
  Filled 2020-03-11: qty 2

## 2020-03-11 MED ORDER — INFLUENZA VAC A&B SA ADJ QUAD 0.5 ML IM PRSY
0.5000 mL | PREFILLED_SYRINGE | INTRAMUSCULAR | Status: DC
  Filled 2020-03-11: qty 0.5

## 2020-03-11 MED ORDER — POTASSIUM CHLORIDE 10 MEQ/50ML IV SOLN
10.0000 meq | INTRAVENOUS | Status: DC
  Filled 2020-03-11 (×4): qty 50

## 2020-03-11 MED ORDER — NOREPINEPHRINE 4 MG/250ML-% IV SOLN
0.0000 ug/min | INTRAVENOUS | Status: DC
  Administered 2020-03-11: 5 ug/min via INTRAVENOUS

## 2020-03-11 MED ORDER — ACETAMINOPHEN 325 MG PO TABS
650.0000 mg | ORAL_TABLET | ORAL | Status: DC | PRN
  Administered 2020-03-12: 650 mg via ORAL
  Filled 2020-03-11: qty 2

## 2020-03-11 MED ORDER — PANTOPRAZOLE SODIUM 40 MG IV SOLR
40.0000 mg | INTRAVENOUS | Status: DC
  Administered 2020-03-11 – 2020-03-13 (×3): 40 mg via INTRAVENOUS
  Filled 2020-03-11 (×3): qty 40

## 2020-03-11 MED ORDER — MAGNESIUM SULFATE 4 GM/100ML IV SOLN
4.0000 g | Freq: Once | INTRAVENOUS | Status: AC
  Administered 2020-03-11: 4 g via INTRAVENOUS
  Filled 2020-03-11: qty 100

## 2020-03-11 MED ORDER — METRONIDAZOLE IN NACL 5-0.79 MG/ML-% IV SOLN
500.0000 mg | Freq: Once | INTRAVENOUS | Status: AC
  Administered 2020-03-11: 500 mg via INTRAVENOUS
  Filled 2020-03-11: qty 100

## 2020-03-11 MED ORDER — DOCUSATE SODIUM 100 MG PO CAPS: 100.0000 mg | ORAL_CAPSULE | Freq: Two times a day (BID) | ORAL | Status: DC | PRN

## 2020-03-11 MED ORDER — CHLORHEXIDINE GLUCONATE CLOTH 2 % EX PADS
6.0000 | MEDICATED_PAD | Freq: Every day | CUTANEOUS | Status: DC
  Administered 2020-03-11 – 2020-03-14 (×4): 6 via TOPICAL

## 2020-03-11 MED ORDER — SODIUM CHLORIDE 0.9 % IV SOLN: INTRAVENOUS | Status: DC

## 2020-03-11 MED ORDER — POTASSIUM CHLORIDE 10 MEQ/100ML IV SOLN
10.0000 meq | INTRAVENOUS | Status: AC
  Administered 2020-03-11 – 2020-03-12 (×4): 10 meq via INTRAVENOUS
  Filled 2020-03-11 (×5): qty 100

## 2020-03-11 MED ORDER — SODIUM CHLORIDE 0.9 % IV SOLN
250.0000 mL | INTRAVENOUS | Status: DC
  Administered 2020-03-13: 250 mL via INTRAVENOUS

## 2020-03-11 MED ORDER — CHLORHEXIDINE GLUCONATE 0.12% ORAL RINSE (MEDLINE KIT)
15.0000 mL | Freq: Two times a day (BID) | OROMUCOSAL | Status: DC
  Administered 2020-03-11 – 2020-03-14 (×6): 15 mL via OROMUCOSAL

## 2020-03-11 MED ORDER — ROCURONIUM BROMIDE 50 MG/5ML IV SOLN
70.0000 mg | Freq: Once | INTRAVENOUS | Status: AC
  Administered 2020-03-11: 70 mg via INTRAVENOUS

## 2020-03-11 MED ORDER — SODIUM CHLORIDE 0.9 % IV SOLN: Freq: Once | INTRAVENOUS | Status: AC

## 2020-03-11 MED ORDER — NOREPINEPHRINE 4 MG/250ML-% IV SOLN
2.0000 ug/min | INTRAVENOUS | Status: DC
  Administered 2020-03-11: 8 ug/min via INTRAVENOUS
  Administered 2020-03-11: 6 ug/min via INTRAVENOUS
  Administered 2020-03-12: 10 ug/min via INTRAVENOUS
  Administered 2020-03-12: 12 ug/min via INTRAVENOUS
  Administered 2020-03-12: 10 ug/min via INTRAVENOUS
  Administered 2020-03-13: 8 ug/min via INTRAVENOUS
  Administered 2020-03-13: 6 ug/min via INTRAVENOUS
  Administered 2020-03-14: 4 ug/min via INTRAVENOUS
  Filled 2020-03-11 (×8): qty 250

## 2020-03-11 MED ORDER — NALOXONE HCL 2 MG/2ML IJ SOSY
PREFILLED_SYRINGE | INTRAMUSCULAR | Status: AC
  Filled 2020-03-11: qty 2

## 2020-03-11 MED ORDER — ENOXAPARIN SODIUM 30 MG/0.3ML ~~LOC~~ SOLN
30.0000 mg | SUBCUTANEOUS | Status: DC
  Administered 2020-03-11 – 2020-03-13 (×3): 30 mg via SUBCUTANEOUS
  Filled 2020-03-11 (×3): qty 0.3

## 2020-03-11 MED ORDER — SODIUM CHLORIDE 0.9 % IV BOLUS
1000.0000 mL | Freq: Once | INTRAVENOUS | Status: AC
  Administered 2020-03-11: 1000 mL via INTRAVENOUS

## 2020-03-11 MED ORDER — VANCOMYCIN HCL 1250 MG/250ML IV SOLN
1250.0000 mg | Freq: Once | INTRAVENOUS | Status: AC
  Administered 2020-03-11: 1250 mg via INTRAVENOUS
  Filled 2020-03-11 (×2): qty 250

## 2020-03-11 MED ORDER — SODIUM CHLORIDE 0.9% FLUSH
3.0000 mL | Freq: Two times a day (BID) | INTRAVENOUS | Status: DC
  Administered 2020-03-11 – 2020-03-14 (×7): 3 mL via INTRAVENOUS

## 2020-03-11 MED ORDER — ACETAMINOPHEN 650 MG RE SUPP
650.0000 mg | Freq: Once | RECTAL | Status: AC
  Administered 2020-03-11: 650 mg via RECTAL
  Filled 2020-03-11: qty 1

## 2020-03-11 MED ORDER — SODIUM CHLORIDE 0.9 % IV BOLUS (SEPSIS)
250.0000 mL | Freq: Once | INTRAVENOUS | Status: AC
  Administered 2020-03-11: 250 mL via INTRAVENOUS

## 2020-03-11 NOTE — H&P (Addendum)
Name: [name] MRN: [id number] DOB: Apr 25, 1940     CONSULTATION DATE: 04/09/2020  REFERRING MD :  Cheri Fowler  CHIEF COMPLAINT:  Acute hypoxic resp failure   HISTORY OF PRESENT ILLNESS:   80 y.o. female that arrives via EMS  found unresponsive at her long-term care facility x 24 hrs Patient arrives unresponsive with agonal respirations. Is confirmed with EMS that patient is a full code and patient was immediately intubated for airway protection.  Patient with history of Multiple myeloma-presented also with acute and severe hypotension, severe resp failure, severe acidosis, severe hypercalcemia, with severe renal failure   ER COURSE Patient with diagnosis of multiorgan failure  Patient given 8L of IVF's Placed on MV support Started on pressors     HOSPITAL COURSE/EVENTS 3/1 admission to ICU severe multiorgan failure   PAST MEDICAL HISTORY :   has a past medical history of Anginal pain ([place]), Anxiety, Arthritis, CAD (coronary artery disease), CHF (congestive heart failure) ([place]), Chronic pain, High cholesterol, History of blood transfusion, Hypertension, Ischemic cardiomyopathy, MI (myocardial infarction) ([place]), MI (myocardial infarction) ([place]) (08/2015), Multiple myeloma ([place]) (dx'd 2015), Nausea and vomiting (05/10/2016), PAD (peripheral artery disease) ([place]), Stroke ([place]), and Type II diabetes mellitus ([place]).  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 Prior to Admission medications   Medication Sig Start Date End Date Taking? Authorizing Provider  acetaminophen (TYLENOL) 325 MG tablet Take 650 mg by mouth every 4 (four)  hours as needed.   Yes [provider]  clopidogrel (PLAVIX) 75 MG tablet Take 75 mg by mouth daily.  04/13/17  Yes [provider]  conjugated estrogens (PREMARIN) vaginal cream Place 1 Applicatorful vaginally once a week.   Yes [provider]  diclofenac sodium (VOLTAREN) 1 % GEL Apply 2 g topically daily as needed (to affected, painful sites).    Yes [provider]  Evolocumab (REPATHA) 140 MG/ML SOSY Inject 140 mLs into the skin every 14 (fourteen) days.   Yes [provider]  fentaNYL (DURAGESIC) 50 MCG/HR Place 1 patch onto the skin every 3 (three) days. 09/07/18  Yes Ghimire, Henreitta Leber, MD  insulin lispro (HUMALOG) 100 UNIT/ML injection Inject 0-12 Units into the skin 3 (three) times daily before meals.   Yes [provider]  isosorbide mononitrate (IMDUR) 30 MG 24 hr tablet Take 1 tablet (30 mg total) by mouth daily. Please make yearly appt with Dr. Meda Coffee for September before anymore refills. 1st attempt 09/07/18  Yes Ghimire, Henreitta Leber, MD  lisinopril (ZESTRIL) 40 MG tablet Take 40 mg by mouth daily.   Yes [provider]  metoprolol succinate (TOPROL-XL) 25 MG 24 hr tablet Take 25 mg by mouth daily.   Yes [provider]  nitroGLYCERIN (NITROSTAT) 0.4 MG SL tablet Place 1 tablet (0.4 mg total) under the tongue every 5 (five) minutes as needed for chest pain. 01/15/16  Yes Arbutus Leas, NP  ondansetron (ZOFRAN ODT) 4 MG disintegrating tablet Allow 1-2 tablets to dissolve in your mouth every 8 hours as needed for nausea/vomiting 05/31/19  Yes Hinda Kehr, MD  pantoprazole (PROTONIX) 40 MG tablet Take 1 tablet (40 mg total) by mouth daily at 6 (six) AM. 10/07/16  Yes Eulogio Bear U, DO  polyethylene  glycol (MIRALAX / GLYCOLAX) 17 g packet Take 17 g by mouth daily.   Yes [provider]  ranolazine (RANEXA) 500 MG 12 hr tablet Take 500 mg by mouth 2 (two) times daily.   Yes [provider]  senna (SENOKOT)  8.6 MG TABS tablet Take 2 tablets by mouth at bedtime.   Yes [provider]  loratadine (CLARITIN) 10 MG tablet Take 1 tablet (10 mg total) by mouth daily. 11/15/19 12/15/19  Laurene Footman B, PA-C   Allergies  Allergen Reactions  . Benzodiazepines Anxiety and Other (See Comments)    Confusion, also  . Iodinated Diagnostic Agents Swelling    "Head swells"  . Other Swelling    Smoke--Head swelling  . Statins Swelling    "Head swells"  . Ticagrelor Shortness Of Breath  . Metoclopramide Other (See Comments)    Causes tremors and insomnia  . Amoxicillin Hives and Rash  . Codeine Hives and Rash  . Duloxetine Nausea Only  . Sulfa Antibiotics Hives and Rash    FAMILY HISTORY:  family history includes Diabetes in her mother; Heart failure in her mother. SOCIAL HISTORY:  reports that she has never smoked. She has never used smokeless tobacco. She reports previous alcohol use. She reports that she does not use drugs.  REVIEW OF SYSTEMS:   Unable to obtain due to critical illness      Estimated body mass index is 27.34 kg/m as calculated from the following:   Height as of this encounter: 5' 2"  (1.575 m).   Weight as of this encounter: 67.8 kg.    VITAL SIGNS: Temp:  [100.4 F (38 C)-104.6 F (40.3 C)] 100.4 F (38 C) (03/01 1630) Pulse Rate:  [111-127] 111 (03/01 1630) Resp:  [20-124] 20 (03/01 1630) BP: (47-178)/(31-66) 158/65 (03/01 1630) SpO2:  [91 %-100 %] 97 % (03/01 1630) FiO2 (%):  [40 %-100 %] 40 % (03/01 1415) Weight:  [67.8 kg] 67.8 kg (03/01 1219)   No intake/output data recorded. Total I/O In: 3440.6 [I.V.:2000; IV Piggyback:1440.6] Out: -    SpO2: 97 % FiO2 (%): (S) 40 %   Physical Examination:  GENERAL:critically ill appearing, +resp distress HEAD: Normocephalic, atraumatic.  EYES: Pupils equal, round, reactive to light.  No scleral icterus.  MOUTH: Moist mucosal membrane. NECK: Supple. No JVD.  PULMONARY: +rhonchi,  +wheezing CARDIOVASCULAR: S1 and S2. Regular rate and rhythm. No murmurs, rubs, or gallops.  GASTROINTESTINAL: Soft, nontender, -distended.  Positive bowel sounds.  MUSCULOSKELETAL: No swelling, clubbing, or edema.  NEUROLOGIC: obtunded SKIN:intact,warm,dry    MEDICATIONS: I have reviewed all medications and confirmed regimen as documented   CULTURE RESULTS   No results found for this or any previous visit (from the past 240 hour(s)).        IMAGING    CT Head Wo Contrast  Result Date: 04/04/2020 CLINICAL DATA:  Mental status change. EXAM: CT HEAD WITHOUT CONTRAST TECHNIQUE: Contiguous axial images were obtained from the base of the skull through the vertex without intravenous contrast. COMPARISON:  CT head, 02/02/2020 FINDINGS: Brain: No evidence of acute infarction, hemorrhage, hydrocephalus, extra-axial collection or mass lesion/mass effect. Generalized cerebral atrophy with ex vacuo ventricular dilation, not substantially changed from priors. Vascular: Calcific atherosclerosis. No hyperdense vessel identified. Skull: No acute fracture. Sinuses/Orbits: Visualized sinuses are clear.  Unremarkable orbits. Other: No mastoid effusions. IMPRESSION: 1. No evidence of acute intracranial abnormality. 2. Similar chronic microvascular ischemic disease and generalized cerebral volume loss. Electronically Signed   By: Margaretha Sheffield  MD   On: 04/05/2020 15:12   DG Chest Portable 1 View  Result Date: 03/30/2020 CLINICAL DATA:  Intubation. EXAM: PORTABLE CHEST 1 VIEW COMPARISON:  CT 02/02/2020.  Chest x-ray 02/02/2020. FINDINGS: Endotracheal tube and NG tube in stable position. Prior CABG. Heart size normal. Mild bibasilar atelectasis. Mild bibasilar infiltrates cannot be excluded. No pleural effusion or pneumothorax. Old left rib fractures noted. IMPRESSION: 1. Endotracheal tube and NG tube in stable position. 2. Mild bibasilar atelectasis. Mild bibasilar infiltrates cannot be excluded.  Electronically Signed   By: Marcello Moores  Register   On: 03/14/2020 13:06     Nutrition Status:           Indwelling Urinary Catheter continued, requirement due to   Reason to continue Indwelling Urinary Catheter strict Intake/Output monitoring for hemodynamic instability         Ventilator continued, requirement due to severe respiratory failure   Ventilator Sedation RASS 0 to -2      ASSESSMENT AND PLAN SYNOPSIS  80 yo white female with acute and severe hypoxic respiratory failure with severe toxic metabolic encephalopathy from  severe septic shock present on admission with severe acidosis due to UTI and aspiration pneumonia associated with severe hypercalcemia and severe acute renal failure with h/o multiple myeloma  Severe ACUTE Hypoxic and Hypercapnic Respiratory Failure -continue Full MV support -continue Bronchodilator Therapy -Wean Fio2 and PEEP as tolerated -VAP/VENT bundle implementation   ACUTE KIDNEY INJURY/Renal Failure HYPERCALCEMIA -continue Foley Catheter-assess need -Avoid nephrotoxic agents -Follow urine output, BMP -Ensure adequate renal perfusion, optimize oxygenation -Renal dose medications Nephrology consulted SALINE DRIP     NEUROLOGY Acute toxic metabolic encephalopathy, need for sedation Goal RASS -2 to -3   SHOCK-SEPSIS -use vasopressors to keep MAP>65 -follow ABG and LA -follow up cultures -emperic ABX -aggressive IV fluid resuscitation   CARDIAC ICU monitoring  ID COVID PENDING -continue IV abx as prescibed -follow up cultures  GI GI PROPHYLAXIS as indicated  NUTRITIONAL STATUS DIET-->NPO Constipation protocol as indicated   ENDO - will use ICU hypoglycemic\Hyperglycemia protocol if needed   ELECTROLYTES -follow labs as needed -replace as needed -pharmacy consultation and following    DVT/GI PRX ordered and assessed TRANSFUSIONS AS NEEDED MONITOR FSBS I Assessed the need for Labs I Assessed the need for  Foley I Assessed the need for Central Venous Line Family Discussion when available I Assessed the need for Mobilization I made an Assessment of medications to be adjusted accordingly Safety Risk assessment Completed  CASE DISCUSSED IN MULTIDISCIPLINARY ROUNDS WITH ICU TEAM   Critical Care Time devoted to patient care services described in this note is 65 minutes.   Overall, patient is critically ill, prognosis is guarded.  Patient with Multiorgan failure and at high risk for cardiac arrest and death.   Prognosis is guarded Will update family accordingly   Corrin Parker, M.D.  [facility]  Medical Director [place] Director [facility] Cardio-Pulmonary Department

## 2020-03-11 NOTE — Consult Note (Signed)
CODE SEPSIS - PHARMACY COMMUNICATION  **Broad Spectrum Antibiotics should be administered within 1 hour of Sepsis diagnosis**  Time Code Sepsis Called/Page Received: 1246  Antibiotics Ordered: 1300 - Vancomycin 1.25g x1 - Cefepime 2g x1 - Metronidazole 500mg  IV x1  Time of 1st antibiotic administration: 1253  Additional action taken by pharmacy: N/A  If necessary, Name of Provider/Nurse Contacted: N/A   Lorna Dibble ,PharmD Clinical Pharmacist  03/23/2020  12:57 PM

## 2020-03-11 NOTE — ED Notes (Signed)
Lab reported all blood tubes were contaminated.  Requested lab to redraw as pt is difficult and labs were drawn from 3 separate lines.

## 2020-03-11 NOTE — ED Provider Notes (Signed)
[facility] Emergency Department Provider Note   ____________________________________________   Event Date/Time   First MD Initiated Contact with Patient 03/29/2020 1225     (approximate)  I have reviewed the triage vital signs and the nursing notes.   HISTORY  Chief Complaint Altered Mental Status    HPI [name] is a 80 y.o. female that arrives via EMS emergency traffic due to being found unresponsive at her long-term care facility.  Patient arrives unresponsive with agonal respirations. Is confirmed with EMS that patient is a full code and patient was immediately intubated for airway protection. Further history and review of systems unable to obtain given mental status.         Past Medical History:  Diagnosis Date  . Anginal pain ([place])   . Anxiety   . Arthritis    "shoulders primarily" (01/13/2016)  . CAD (coronary artery disease)    s/p CABG in Oct 2017  . CHF (congestive heart failure) ([place])   . Chronic pain    from her multiple myeloma but says that her pain is usually in her legs/notes 01/13/2016  . High cholesterol   . History of blood transfusion    "numerous; related to procedures for my heart" (01/13/2016)  . Hypertension   . Ischemic cardiomyopathy    /nots 01/13/2016  . MI (myocardial infarction) ([place])    EKG on arrival 01/13/2016 showed NSR with evidence of old anterior and inferior infarcts  . MI (myocardial infarction) ([place]) 08/2015  . Multiple myeloma ([place]) dx'd 2015   off chemo since 2016  . Nausea and vomiting 05/10/2016   3-4 month hx of nausea and vomiting  . PAD (peripheral artery disease) ([place])   . Stroke [facility])    "several in 1 year; ? year" (01/13/2016)  . Type II diabetes mellitus [facility])     Patient Active Problem List   Diagnosis Date Noted  . Palliative care encounter   . Respiratory failure ([place]) 03/16/2020  . Hyponatremia 08/27/2018  . Iron deficiency anemia 08/27/2018  . Nausea 08/27/2018  .  Gastroenteritis due to COVID-19 virus 08/27/2018  . Severe nausea 10/05/2016  . Nausea & vomiting 10/04/2016  . Fistula 09/30/2016  . Intractable nausea and vomiting 09/18/2016  . Lower extremity edema 05/10/2016  . Ischemic cardiomyopathy 02/23/2016  . Chills 02/23/2016  . Weight loss 02/23/2016  . Unstable angina pectoris ([place]) 01/13/2016  . Chest pain   . PVD (peripheral vascular disease) ([place])   . Coronary artery disease involving coronary bypass graft of native heart with angina pectoris ([place])   . Peripheral arterial occlusive disease ([place]) 12/15/2015  . Acute blood loss anemia 11/13/2015  . Leukocytosis 11/13/2015  . Chronic pain 11/12/2015  . Acute postoperative pain 11/12/2015  . 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 Acute UTI 11/05/2015  . Edema of left lower extremity 06/16/2015  . Edema of upper extremity 06/16/2015  . Knee pain, left 06/14/2015  . AKI (acute kidney injury) ([place]) 06/11/2015  . Hyperkalemia 06/11/2015  . Hypomagnesemia 06/11/2015  . Acute leg pain, left 05/20/2015  . Multiple bruises 05/20/2015  . Recurrent urinary tract infection 03/26/2014  . Recurrent UTI 03/26/2014  . H/O gastric bypass 11/07/2013  . Peripheral neuropathy 10/10/2013  . Encounter for antineoplastic chemotherapy 10/10/2013  . Multiple myeloma ([place]) 07/16/2013  . History of critical ischemia of lower extremity 05/15/2013  . Critical lower limb ischemia ([place]) 05/15/2013  . Intermittent claudication ([place]) 07/27/2011  . Chronic coronary artery disease  04/26/2011  . Gastroesophageal reflux disease 04/26/2011  . Type 2 diabetes mellitus ([place]) 03/30/2011  . Hyperlipidemia, unspecified 03/30/2011  . Hypertension 03/30/2011    Past Surgical History:  Procedure Laterality Date  . CARDIAC CATHETERIZATION N/A 01/14/2016   Procedure: Left Heart Cath and Cors/Grafts Angiography;  Surgeon: Burnell Blanks, MD;  Location: [facility];  Service: Cardiovascular;  Laterality: N/A;  . CARPAL  TUNNEL RELEASE Left   . CORONARY ANGIOPLASTY WITH STENT PLACEMENT  05/2015   DES placed to the mid and distal LAD as well as OM2/notes 01/13/2016  . CORONARY ANGIOPLASTY WITH STENT PLACEMENT  ~ 2010  . CORONARY ARTERY BYPASS GRAFT  11/11/2015   CABG X2 LIMA-LAD and SVG-OM/notes 01/13/2016  . DILATION AND CURETTAGE OF UTERUS    . FEMORAL-POPLITEAL BYPASS GRAFT Left 2013   Archie Endo 01/13/2016  . LAPAROSCOPIC GASTRIC BANDING  2007  . RENAL ARTERY STENT    . VAGINAL HYSTERECTOMY      Prior to Admission medications   Medication Sig Start Date End Date Taking? Authorizing Provider  acetaminophen (TYLENOL) 325 MG tablet Take 650 mg by mouth every 4 (four) hours as needed.   Yes [provider]  clopidogrel (PLAVIX) 75 MG tablet Take 75 mg by mouth daily.  04/13/17  Yes [provider]  conjugated estrogens (PREMARIN) vaginal cream Place 1 Applicatorful vaginally once a week.   Yes [provider]  diclofenac sodium (VOLTAREN) 1 % GEL Apply 2 g topically daily as needed (to affected, painful sites).    Yes [provider]  Evolocumab (REPATHA) 140 MG/ML SOSY Inject 140 mLs into the skin every 14 (fourteen) days.   Yes [provider]  fentaNYL (DURAGESIC) 50 MCG/HR Place 1 patch onto the skin every 3 (three) days. 09/07/18  Yes Ghimire, Henreitta Leber, MD  insulin lispro (HUMALOG) 100 UNIT/ML injection Inject 0-12 Units into the skin 3 (three) times daily before meals.   Yes [provider]  isosorbide mononitrate (IMDUR) 30 MG 24 hr tablet Take 1 tablet (30 mg total) by mouth daily. Please make yearly appt with Dr. Meda Coffee for September before anymore refills. 1st attempt 09/07/18  Yes Ghimire, Henreitta Leber, MD  lisinopril (ZESTRIL) 40 MG tablet Take 40 mg by mouth daily.   Yes [provider]  metoprolol succinate (TOPROL-XL) 25 MG 24 hr tablet Take 25 mg by mouth daily.   Yes [provider]  nitroGLYCERIN (NITROSTAT) 0.4 MG SL tablet Place 1  tablet (0.4 mg total) under the tongue every 5 (five) minutes as needed for chest pain. 01/15/16  Yes Arbutus Leas, NP  ondansetron (ZOFRAN ODT) 4 MG disintegrating tablet Allow 1-2 tablets to dissolve in your mouth every 8 hours as needed for nausea/vomiting 05/31/19  Yes Hinda Kehr, MD  pantoprazole (PROTONIX) 40 MG tablet Take 1 tablet (40 mg total) by mouth daily at 6 (six) AM. 10/07/16  Yes Vann, Jessica U, DO  polyethylene glycol (MIRALAX / GLYCOLAX) 17 g packet Take 17 g by mouth daily.   Yes [provider]  ranolazine (RANEXA) 500 MG 12 hr tablet Take 500 mg by mouth 2 (two) times daily.   Yes [provider]  senna (SENOKOT) 8.6 MG TABS tablet Take 2 tablets by mouth at bedtime.   Yes [provider]  loratadine (CLARITIN) 10 MG tablet Take 1 tablet (10 mg total) by mouth daily. 11/15/19 12/15/19  Laurene Footman B, PA-C    Allergies Benzodiazepines, Iodinated diagnostic agents, Other, Statins,  Ticagrelor, Metoclopramide, Amoxicillin, Codeine, Duloxetine, and Sulfa antibiotics  Family History  Problem Relation Age of Onset  . Diabetes Mother   . Heart failure Mother   . Prostate cancer Neg Hx   . Kidney cancer Neg Hx   . Bladder Cancer Neg Hx   . Colon cancer Neg Hx   . Stomach cancer Neg Hx     Social History Social History   Tobacco Use  . Smoking status: Never Smoker  . Smokeless tobacco: Never Used  Vaping Use  . Vaping Use: Never used  Substance Use Topics  . Alcohol use: Not Currently    Comment: occasional wine  . Drug use: No    Review of Systems Unable to assess ____________________________________________   PHYSICAL EXAM:  VITAL SIGNS: ED Triage Vitals  Enc Vitals Group     BP 03/31/2020 1200 (!) 77/38     Pulse Rate 04/09/2020 1200 (!) 127     Resp 03/22/2020 1200 (!) 36     Temp 04/08/2020 1241 (!) 103.7 F (39.8 C)     Temp Source 03/14/2020 1241 Core     SpO2 03/20/2020 1200 96 %     Weight 04/04/2020 1219 149 lb 7.6 oz (67.8 kg)      Height 04/01/2020 1219 5' 2"  (1.575 m)     Head Circumference --      Peak Flow --      Pain Score --      Pain Loc --      Pain Edu? --      Excl. in [place]? --    Constitutional: Eyes open, unresponsive, agonal respirations on stretcher with nonrebreather in place Eyes: Conjunctivae are injected. PERRL. Head: Atraumatic. Nose: No congestion/rhinnorhea. Mouth/Throat: Mucous membranes are moist. Dried blood at the teeth Neck: Supple. No stridor Cardiovascular: Grossly normal heart sounds. Poor peripheral circulation. Respiratory: Agonal respirations. Tachypneic. Rhonchi appreciated over bilateral lower lung fields Gastrointestinal: Soft. No distention. No palpable masses Genitourinary: Normal external female genitalia without lesions or rash Musculoskeletal: No obvious deformities Neurologic: Unresponsive. GCS 3 T. Skin:  Skin is warm and dry. No rash noted. Fentanyl patch appreciated and removed from left upper extremity ____________________________________________   LABS (all labs ordered are listed, but only abnormal results are displayed)  Labs Reviewed  C DIFFICILE QUICK SCREEN W PCR REFLEX - Abnormal; Notable for the following components:      Result Value   C Diff antigen POSITIVE (*)    All other components within normal limits  LACTIC ACID, PLASMA - Abnormal; Notable for the following components:   Lactic Acid, Venous 5.3 (*)    All other components within normal limits  URINALYSIS, COMPLETE (UACMP) WITH MICROSCOPIC - Abnormal; Notable for the following components:   Color, Urine YELLOW (*)    APPearance TURBID (*)    Hgb urine dipstick SMALL (*)    Protein, ur 100 (*)    Leukocytes,Ua SMALL (*)    Bacteria, UA MANY (*)    All other components within normal limits  BLOOD GAS, ARTERIAL - Abnormal; Notable for the following components:   pO2, Arterial 133 (*)    Acid-base deficit 3.8 (*)    All other components within normal limits  CBC WITH DIFFERENTIAL/PLATELET  - Abnormal; Notable for the following components:   Hemoglobin 11.8 (*)    MCHC 29.4 (*)    RDW 18.0 (*)    Platelets 127 (*)    nRBC 1.6 (*)    Neutro Abs 0.7 (*)  Lymphs Abs 4.3 (*)    All other components within normal limits  COMPREHENSIVE METABOLIC PANEL - Abnormal; Notable for the following components:   Sodium 147 (*)    Potassium 2.8 (*)    Chloride 113 (*)    CO2 16 (*)    Glucose, Bld 259 (*)    BUN 56 (*)    Creatinine, Ser 3.01 (*)    Calcium >15.0 (*)    Albumin 2.9 (*)    AST 156 (*)    ALT 64 (*)    GFR, Estimated 15 (*)    Anion gap 18 (*)    All other components within normal limits  PROTIME-INR - Abnormal; Notable for the following components:   Prothrombin Time 16.0 (*)    INR 1.3 (*)    All other components within normal limits  LACTIC ACID, PLASMA - Abnormal; Notable for the following components:   Lactic Acid, Venous 8.6 (*)    All other components within normal limits  POTASSIUM - Abnormal; Notable for the following components:   Potassium 2.4 (*)    All other components within normal limits  BLOOD GAS, ARTERIAL - Abnormal; Notable for the following components:   pCO2 arterial 26 (*)    pO2, Arterial 151 (*)    Bicarbonate 17.7 (*)    Acid-base deficit 5.4 (*)    All other components within normal limits  GLUCOSE, CAPILLARY - Abnormal; Notable for the following components:   Glucose-Capillary 263 (*)    All other components within normal limits  HEMOGLOBIN A1C - Abnormal; Notable for the following components:   Hgb A1c MFr Bld 6.2 (*)    All other components within normal limits  BASIC METABOLIC PANEL - Abnormal; Notable for the following components:   Sodium 148 (*)    Potassium 3.4 (*)    Chloride 117 (*)    CO2 18 (*)    Glucose, Bld 120 (*)    BUN 53 (*)    Creatinine, Ser 2.46 (*)    Calcium 14.1 (*)    GFR, Estimated 20 (*)    All other components within normal limits  CBC - Abnormal; Notable for the following components:   WBC  2.0 (*)    RBC 3.74 (*)    Hemoglobin 11.1 (*)    HCT 34.6 (*)    RDW 18.1 (*)    nRBC 1.0 (*)    All other components within normal limits  MAGNESIUM - Abnormal; Notable for the following components:   Magnesium 3.1 (*)    All other components within normal limits  PHOSPHORUS - Abnormal; Notable for the following components:   Phosphorus 2.3 (*)    All other components within normal limits  MAGNESIUM - Abnormal; Notable for the following components:   Magnesium 1.2 (*)    All other components within normal limits  LACTIC ACID, PLASMA - Abnormal; Notable for the following components:   Lactic Acid, Venous 6.0 (*)    All other components within normal limits  GLUCOSE, CAPILLARY - Abnormal; Notable for the following components:   Glucose-Capillary 230 (*)    All other components within normal limits  BLOOD GAS, VENOUS - Abnormal; Notable for the following components:   pH, Ven 7.17 (*)    Bicarbonate 16.4 (*)    Acid-base deficit 11.6 (*)    All other components within normal limits  GLUCOSE, CAPILLARY - Abnormal; Notable for the following components:   Glucose-Capillary 157 (*)    All other  components within normal limits  LACTIC ACID, PLASMA - Abnormal; Notable for the following components:   Lactic Acid, Venous 6.6 (*)    All other components within normal limits  GLUCOSE, CAPILLARY - Abnormal; Notable for the following components:   Glucose-Capillary 115 (*)    All other components within normal limits  GLUCOSE, CAPILLARY - Abnormal; Notable for the following components:   Glucose-Capillary 104 (*)    All other components within normal limits  GLUCOSE, CAPILLARY - Abnormal; Notable for the following components:   Glucose-Capillary 130 (*)    All other components within normal limits  CULTURE, BLOOD (ROUTINE X 2)  CULTURE, BLOOD (ROUTINE X 2)  RESP PANEL BY RT-PCR (FLU A&B, COVID) ARPGX2  MRSA PCR SCREENING  CLOSTRIDIUM DIFFICILE BY PCR, REFLEXED  PATHOLOGIST SMEAR  REVIEW  PHOSPHORUS  GLUCOSE, CAPILLARY  CBC WITH DIFFERENTIAL/PLATELET  BASIC METABOLIC PANEL  LACTIC ACID, PLASMA  LACTIC ACID, PLASMA   ____________________________________________  EKG  ED ECG REPORT I, Naaman Plummer, the attending physician, personally viewed and interpreted this ECG.  Date: 03/26/2020 EKG Time: 1156 Rate: 124 Rhythm: Tachycardic sinus rhythm QRS Axis: normal Intervals: normal ST/T Wave abnormalities: normal Narrative Interpretation: no evidence of acute ischemia  ____________________________________________  RADIOLOGY  ED MD interpretation: Single view portable chest x-ray shows endotracheal tube and NG tube in stable position. Shows mild bibasilar infiltrates bilaterally  Official radiology report(s): DG Chest Port 1 View  Result Date: 03/12/2020 CLINICAL DATA:  Respiratory failure EXAM: PORTABLE CHEST 1 VIEW COMPARISON:  03/13/2020 FINDINGS: Endotracheal tube is seen 4 cm above the carina. Nasogastric tube extends into the gastric fundus. Developing bibasilar opacities, right greater than left, in keeping with atelectasis or focal infiltrate. No pneumothorax or pleural effusion. 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 The pulmonary vascularity is normal. IMPRESSION: Progressive bibasilar atelectasis or infiltrate. Stable support tubes. Electronically Signed   By: Fidela Salisbury MD   On: 03/12/2020 04:42    ____________________________________________   PROCEDURES  Procedure(s) performed (including Critical Care):  .Critical Care Performed by: Naaman Plummer, MD Authorized by: Naaman Plummer, MD   Critical care provider statement:    Critical care time (minutes):  26   Critical care time was exclusive of:  Separately billable procedures and treating other patients   Critical care was necessary to treat or prevent imminent or life-threatening deterioration of the following conditions:  Sepsis,  respiratory failure and renal failure   Critical care was time spent personally by me on the following activities:  Discussions with consultants, evaluation of patient's response to treatment, examination of patient, ordering and performing treatments and interventions, ordering and review of laboratory studies, ordering and review of radiographic studies, pulse oximetry, re-evaluation of patient's condition, obtaining history from patient or surrogate and review of old charts   I assumed direction of critical care for this patient from another provider in my specialty: no     Care discussed with: admitting provider   Procedure Name: Intubation Date/Time: 03/12/2020 3:56 PM Performed by: Naaman Plummer, MD Pre-anesthesia Checklist: Patient identified and Emergency Drugs available Oxygen Delivery Method: Ambu bag Preoxygenation: Pre-oxygenation with 100% oxygen Induction Type: Rapid sequence Ventilation: Mask ventilation without difficulty Laryngoscope Size: Glidescope Grade View: Grade I Tube size: 7.5 mm Number of attempts: 1 Airway Equipment and Method: Video-laryngoscopy Placement Confirmation: ETT inserted through vocal cords under direct vision,  Positive ETCO2,  CO2 detector and Breath sounds checked- equal and bilateral Secured at: 23  cm Tube secured with: ETT holder Dental Injury: Teeth and Oropharynx as per pre-operative assessment     .1-3 Lead EKG Interpretation Performed by: Naaman Plummer, MD Authorized by: Naaman Plummer, MD     Interpretation: abnormal     ECG rate:  124   ECG rate assessment: tachycardic     Rhythm: sinus tachycardia     Ectopy: none     Conduction: normal       ____________________________________________   INITIAL IMPRESSION / ASSESSMENT AND PLAN / ED COURSE  As part of my medical decision making, I reviewed the following data within the [place] notes reviewed and incorporated, Labs reviewed, EKG interpreted,  Old chart reviewed, Radiograph reviewed and Notes from prior ED visits reviewed and incorporated        80 year old female presents via EMS due to being found unresponsive in her long-term care facility. In speaking further to patient's son, she was recently diagnosed with hypercalcemia secondary to her multiple myeloma but was never treated. Son denies patient currently being treated with radiation or chemotherapy for her multiple myeloma. Differential diagnosis for this patient includes but is not limited to: Sepsis, hypercalcemia, urinary tract infection, CVA History of signs of sepsis from an unknown source.  Patient shows leukocytosis and bacteriuria with a potential source for her sepsis.  Patient apparently started on broad-spectrum antibiotics, continue 30 cc/kg bolus, and and a norepinephrine drip for continued hypotension Patient intubated due to acute respiratory failure Dispo: Admit to the ICU      ____________________________________________   FINAL CLINICAL IMPRESSION(S) / ED DIAGNOSES  Final diagnoses:  None     ED Discharge Orders    None       Note:  This document was prepared using Dragon voice recognition software and may include unintentional dictation errors.   Naaman Plummer, MD 03/12/20 [phone]

## 2020-03-11 NOTE — Progress Notes (Signed)
GOALS OF CARE DISCUSSION  The Clinical status was relayed to family in detail. Son at bedside  Updated and notified of patients medical condition.  Patient remains unresponsive and will not open eyes to command.    Patient is having a weak cough and struggling to remove secretions.   Patient with increased WOB and using accessory muscles to breathe Explained to family course of therapy and the modalities     Patient with Progressive multiorgan failure with a very high probablity of a very minimal chance of meaningful recovery despite all aggressive and optimal medical therapy. Patient is in the Dying  Process associated with Suffering.  Family understands the situation.  They have consented and agreed to DNR status    Family are satisfied with Plan of action and management. All questions answered  Additional CC time 32 mins   Kurian Patricia Pesa, M.D.  [facility]  Medical Director [place] Director [facility] Cardio[facility] Department

## 2020-03-11 NOTE — Consult Note (Signed)
[facility]  Date of admission:  04/02/2020  Inpatient day:  04/03/2020  Consulting physician: Dr Flora Lipps  Reason for Consultation:  Severe hypercalcemia, respiratory failure on ventilator with acidosis and possible recurrence  Chief Complaint: [name] is a 80 y.o. 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  HPI: The patient has a history of IgG lambda multiple myeloma and chronic lymphocytic leukemia.  She is followed by Dr. Laverta Baltimore at [facility].  She was initially diagnosed in 2016 per patient's son and Dr Grayland Ormond.  She received approximately 1 year of chemotherapy before discontinuing.  Bone marrow on 03/22/2019 was hypercellular (80%) with 60% monotypic B cells (lambda restricted, CD5 positive CD23 positive) and 10% monoclonal plasma cells she had Rai stage III disease in [place] to anemia.  Cytogenetics were high risk.  Scan in 2019 was unremarkable.  Imaging studies in 01/2020 revealed prominent retroperitoneal and periportal nodes up to 1 cm.  Labs at [facility] on 12/19/2019 revealed an M spike of 1.51gm/dL, lambda free light chains 63.67 (ratio 0.03) an beta2 microglobulin 9.35 (high).  She was admitted to [facility] from 02/10/2020 - 02/13/2020 with 3 to 4 days of altered mental status.  Labs revealed hypercalcemia resolved which resolved with fluid resuscitation.  Urine culture grew greater than 100,000 colonies of E. coli.  Decision was made to defer treatment secondary to lack of urinary symptoms and history of multiple C. difficile infections on antibiotics.  Her encephalopathy was attributed to the recent start of Marinol.  He was readmitted to [facility] from 02/14/2020 - 03/04/2020.  She presented with  nausea and vomiting and a decline in mental status.  Ionized calcium was high (6.81; corrected calcium near 14) with a low PTH.  She was felt to have hypercalcemia related to multiple myeloma and dehydration.  PTH-rp was normal.  She received pamidronate as well as calcitonin.  PET scan was unremarkable with no new focal lytic lesions.  Head MRI revealed no intracranial abnormality.   She was noted to have chronic nausea and vomiting since 2019.  She has a history of gastric banding.  Abdominal and pelvis CT on 02/14/2020 revealed gastric lap band partially eroded into the stomach with fat-containing umbilical hernia.  There was thickening of the sigmoid colon possibly secondary to chronic inflammatory changes.  She was seen by surgery with imaging findings felt due to surgical technique.  EGD was deferred secondary to patient's declining mental status hypercalcemia nausea and vomiting improved.  Per patient's son, she has struggled with nausea and vomiting and has required daily ondansetron for years.  She was based discharged to [facility].  Per her son, she requires total assistance with all her activities of daily living.  She did fairly well for a few days but then developed altered mental status and confusion.  Oral intake declined.  She became unresponsive.  Labs were drawn and indicated a high calcium and thus was transferred to [facility].  On admission to the emergency room, she was hypotensive (77/48) and febrile (104.5).  He was fluid resuscitated with greater than 8 L of fluid.  She was unresponsive with agonal respiration.  She was intubated for airway protection.  Initial labs revealed a hematocrit of 40.2, hemoglobin 11.8, platelets 127,000, WBC 5600 and ANC  of 700.  Creatinine was 3.01 with a calcium of greater than 15.  Albumen was 2.9 and total protein 7.1.  AST was 156 and ALT 64.  Lactic acid was 8.6.  She was pancultured and begun on vancomycin, cefepime and Flagyl.  She was transferred  to the intensive care unit.  She is currently on Levophed, intubated and unresponsive.    I spoke with the patient's oncologist at [facility], Dr. Laverta Baltimore.  She has had frequent admissions in the recent past.  She was last seen in 12/2019.  He notes that she has not wanted treatment for either her CLL or multiple myeloma.   Past Medical History:  Diagnosis Date  . Anginal pain ([place])   . Anxiety   . Arthritis    "shoulders primarily" (01/13/2016)  . CAD (coronary artery disease)    s/p CABG in Oct 2017  . CHF (congestive heart failure) ([place])   . Chronic pain    from her multiple myeloma but says that her pain is usually in her legs/notes 01/13/2016  . High cholesterol   . History of blood transfusion    "numerous; related to procedures for my heart" (01/13/2016)  . Hypertension   . Ischemic cardiomyopathy    /nots 01/13/2016  . MI (myocardial infarction) ([place])    EKG on arrival 01/13/2016 showed NSR with evidence of old anterior and inferior infarcts  . MI (myocardial infarction) ([place]) 08/2015  . Multiple myeloma ([place]) dx'd 2015   off chemo since 2016  . Nausea and vomiting 05/10/2016   3-4 month hx of nausea and vomiting  . PAD (peripheral artery disease) ([place])   . Stroke [facility])    "several in 1 year; ? year" (01/13/2016)  . Type II diabetes mellitus ([place])     Past Surgical History:  Procedure Laterality Date  . CARDIAC CATHETERIZATION N/A 01/14/2016   Procedure: Left Heart Cath and Cors/Grafts Angiography;  Surgeon: Burnell Blanks, MD;  Location: [facility];  Service: Cardiovascular;  Laterality: N/A;  . CARPAL TUNNEL RELEASE Left   . CORONARY ANGIOPLASTY WITH STENT PLACEMENT  05/2015   DES placed to the mid and distal LAD as well as OM2/notes 01/13/2016  . CORONARY ANGIOPLASTY WITH STENT PLACEMENT  ~ 2010  . CORONARY ARTERY BYPASS GRAFT  11/11/2015   CABG X2 LIMA-LAD and SVG-OM/notes 01/13/2016  . DILATION AND CURETTAGE OF UTERUS    . FEMORAL-POPLITEAL BYPASS GRAFT Left 2013    Archie Endo 01/13/2016  . LAPAROSCOPIC GASTRIC BANDING  2007  . RENAL ARTERY STENT    . VAGINAL HYSTERECTOMY      Family History  Problem Relation Age of Onset  . Diabetes Mother   . Heart failure Mother   . Prostate cancer Neg Hx   . Kidney cancer Neg Hx   . Bladder Cancer Neg Hx   . Colon cancer Neg Hx   . Stomach cancer Neg Hx     Social History:  reports that she has never smoked. She has never used smokeless tobacco. She reports previous alcohol use. She reports that she does not use drugs.  The patient denies any exposure to radiation or toxins.  The patient currently resides at [facility].  Her son, Marlou Sa, and medical power of attorney is in the waiting area outside of the ICU.  She is accompanied by her nurse.  Allergies:  Allergies  Allergen Reactions  . Benzodiazepines Anxiety and Other (See Comments)    Confusion, also  . Iodinated Diagnostic Agents  Swelling    "Head swells"  . Other Swelling    Smoke--Head swelling  . Statins Swelling    "Head swells"  . Ticagrelor Shortness Of Breath  . Metoclopramide Other (See Comments)    Causes tremors and insomnia  . Amoxicillin Hives and Rash  . Codeine Hives and Rash  . Duloxetine Nausea Only  . Sulfa Antibiotics Hives and Rash    Medications Prior to Admission  Medication Sig Dispense Refill  . acetaminophen (TYLENOL) 325 MG tablet Take 650 mg by mouth every 4 (four) hours as needed.    . clopidogrel (PLAVIX) 75 MG tablet Take 75 mg by mouth daily.   11  . conjugated estrogens (PREMARIN) vaginal cream Place 1 Applicatorful vaginally once a week.    . diclofenac sodium (VOLTAREN) 1 % GEL Apply 2 g topically daily as needed (to affected, painful sites).     . Evolocumab (REPATHA) 140 MG/ML SOSY Inject 140 mLs into the skin every 14 (fourteen) days.    . fentaNYL (DURAGESIC) 50 MCG/HR Place 1 patch onto the skin every 3 (three) days. 5 patch 0  . insulin lispro (HUMALOG) 100 UNIT/ML injection Inject 0-12 Units into the  skin 3 (three) times daily before meals.    . isosorbide mononitrate (IMDUR) 30 MG 24 hr tablet Take 1 tablet (30 mg total) by mouth daily. Please make yearly appt with Dr. Meda Coffee for September before anymore refills. 1st attempt    . lisinopril (ZESTRIL) 40 MG tablet Take 40 mg by mouth daily.    . metoprolol succinate (TOPROL-XL) 25 MG 24 hr tablet Take 25 mg by mouth daily.    . nitroGLYCERIN (NITROSTAT) 0.4 MG SL tablet Place 1 tablet (0.4 mg total) under the tongue every 5 (five) minutes as needed for chest pain. 25 tablet 2  . ondansetron (ZOFRAN ODT) 4 MG disintegrating tablet Allow 1-2 tablets to dissolve in your mouth every 8 hours as needed for nausea/vomiting 30 tablet 0  . pantoprazole (PROTONIX) 40 MG tablet Take 1 tablet (40 mg total) by mouth daily at 6 (six) AM. 30 tablet 0  . polyethylene glycol (MIRALAX / GLYCOLAX) 17 g packet Take 17 g by mouth daily.    . ranolazine (RANEXA) 500 MG 12 hr tablet Take 500 mg by mouth 2 (two) times daily.    Marland Kitchen senna (SENOKOT) 8.6 MG TABS tablet Take 2 tablets by mouth at bedtime.    Marland Kitchen loratadine (CLARITIN) 10 MG tablet Take 1 tablet (10 mg total) by mouth daily. 30 tablet 0    Review of Systems  Constitutional: Positive for fever. Negative for chills.  HENT: Negative for nosebleeds.   Respiratory: Negative for cough and sputum production.   Cardiovascular: Negative for chest pain.  Gastrointestinal: Positive for nausea. Negative for blood in stool, constipation, diarrhea and melena.       Chronic nausea requiring daily ondansetron.  Genitourinary:       Decreased urine output.  Skin: Negative for rash.  Neurological:       Unresponsive.    Performance status at baseline:  4  Vitals:  Blood pressure (!) 96/37, pulse 98, temperature 98.78 F (37.1 C), resp. rate (!) 30, height 5' 2"  (1.575 m), weight 136 lb 3.9 oz (61.8 kg), SpO2 96 %.   Physical Exam Vitals and nursing note reviewed.  Constitutional:      Comments: Patient  unresponsive intubated and ventilated in the ICU.  HENT:     Head: Normocephalic and atraumatic.  Comments: ETT and oral gastric tube in place. Eyes:     General: No scleral icterus.    Comments: Pupils small and equal.  Cardiovascular:     Rate and Rhythm: Normal rate.  Pulmonary:     Breath sounds: No wheezing, rhonchi or rales.     Comments: Ventilated breaths. Chest:  Breasts:     Right: No axillary adenopathy or supraclavicular adenopathy.     Left: No axillary adenopathy or supraclavicular adenopathy.    Abdominal:     General: Bowel sounds are normal. There is no distension.     Palpations: There is no hepatomegaly, splenomegaly or mass.  Genitourinary:    Comments: Catheter in place with minimal urine output. Musculoskeletal:     Right lower leg: No edema.     Left lower leg: No edema.  Lymphadenopathy:     Head:     Right side of head: No preauricular, posterior auricular or occipital adenopathy.     Left side of head: No preauricular, posterior auricular or occipital adenopathy.     Cervical: No cervical adenopathy.     Upper Body:     Right upper body: No supraclavicular or axillary adenopathy.     Left upper body: No supraclavicular or axillary adenopathy.     Lower Body: No right inguinal adenopathy. No left inguinal adenopathy.  Skin:    Coloration: Skin is not jaundiced.     Findings: Bruising (upper extremities) present.     Comments: Extremities cool.  Neurological:     Comments: Unresponsive.     Results for orders placed or performed during the hospital encounter of 03/18/2020 (from the past 48 hour(s))  Hemoglobin A1c     Status: Abnormal   Collection Time: 03/14/2020 12:02 PM  Result Value Ref Range   Hgb A1c MFr Bld 6.2 (H) 4.8 - 5.6 %    Comment: (NOTE) Pre diabetes:          5.7%-6.4%  Diabetes:              >6.4%  Glycemic control for   <7.0% adults with diabetes    Mean Plasma Glucose 131.24 mg/dL    Comment: Performed at [facility], [place] [address] [place], [place] [address]  Urinalysis, Complete w Microscopic Urine, Catheterized     Status: Abnormal   Collection Time: 03/23/2020 12:23 PM  Result Value Ref Range   Color, Urine YELLOW (A) YELLOW   APPearance TURBID (A) CLEAR   Specific Gravity, Urine 1.017 1.005 - 1.030   pH 5.0 5.0 - 8.0   Glucose, UA NEGATIVE NEGATIVE mg/dL   Hgb urine dipstick SMALL (A) NEGATIVE   Bilirubin Urine NEGATIVE NEGATIVE   Ketones, ur NEGATIVE NEGATIVE mg/dL   Protein, ur 100 (A) NEGATIVE mg/dL   Nitrite NEGATIVE NEGATIVE   Leukocytes,Ua SMALL (A) NEGATIVE   RBC / HPF 6-10 0 - 5 RBC/hpf   WBC, UA 6-10 0 - 5 WBC/hpf   Bacteria, UA MANY (A) NONE SEEN   Squamous Epithelial / LPF 6-10 0 - 5   Amorphous Crystal PRESENT     Comment: Performed at [facility], [address] [place], [place] [address]  Blood gas, arterial     Status: Abnormal   Collection Time: 03/29/2020  1:06 PM  Result Value Ref Range   FIO2 0.60    Mode ASSIST CONTROL    VT 450 mL   LHR 16 resp/min   Peep/cpap 5.0 cm H20  pH, Arterial 7.40 7.350 - 7.450   pCO2 arterial 33 32.0 - 48.0 mmHg   pO2, Arterial 133 (H) 83.0 - 108.0 mmHg   Bicarbonate 20.4 20.0 - 28.0 mmol/L   Acid-base deficit 3.8 (H) 0.0 - 2.0 mmol/L   O2 Saturation 99.0 %   Patient temperature 37.0    Collection site RIGHT RADIAL    Sample type ARTERIAL DRAW    Allens test (pass/fail) PASS PASS    Comment: Performed at [facility], [address] [place], [place] [address]  CBC with Differential/Platelet     Status: Abnormal   Collection Time: 03/14/2020  1:42 PM  Result Value Ref Range   WBC 5.6 4.0 - 10.5 K/uL   RBC 4.12 3.87 - 5.11 MIL/uL   Hemoglobin 11.8 (L) 12.0 - 15.0 g/dL   HCT 40.2 36.0 - 46.0 %   MCV 97.6 80.0 - 100.0 fL   MCH 28.6 26.0 - 34.0 pg   MCHC 29.4 (L) 30.0 - 36.0 g/dL   RDW 18.0 (H) 11.5 - 15.5 %   Platelets 127 (L) 150 - 400 K/uL   nRBC 1.6 (H) 0.0 - 0.2 %   Neutrophils Relative % 12 %    Neutro Abs 0.7 (L) 1.7 - 7.7 K/uL   Lymphocytes Relative 77 %   Lymphs Abs 4.3 (H) 0.7 - 4.0 K/uL   Monocytes Relative 11 %   Monocytes Absolute 0.6 0.1 - 1.0 K/uL   Eosinophils Relative 0 %   Eosinophils Absolute 0.0 0.0 - 0.5 K/uL   Basophils Relative 0 %   Basophils Absolute 0.0 0.0 - 0.1 K/uL   WBC Morphology ABSOLUTE LYMPHOCYTOSIS    RBC Morphology MIXED RBC POPULATION    Smear Review Normal platelet morphology    Immature Granulocytes 0 %   Abs Immature Granulocytes 0.00 0.00 - 0.07 K/uL   Polychromasia PRESENT     Comment: Performed at [facility], [address] [place], [place] [address]  Comprehensive metabolic panel     Status: Abnormal   Collection Time: 03/13/2020  1:42 PM  Result Value Ref Range   Sodium 147 (H) 135 - 145 mmol/L   Potassium 2.8 (L) 3.5 - 5.1 mmol/L   Chloride 113 (H) 98 - 111 mmol/L   CO2 16 (L) 22 - 32 mmol/L   Glucose, Bld 259 (H) 70 - 99 mg/dL    Comment: Glucose reference range applies only to samples taken after fasting for at least 8 hours.   BUN 56 (H) 8 - 23 mg/dL   Creatinine, Ser 3.01 (H) 0.44 - 1.00 mg/dL   Calcium >15.0 (HH) 8.9 - 10.3 mg/dL    Comment: CRITICAL RESULT CALLED TO, READ BACK BY AND VERIFIED WITH TAMMY JUDE RN AT 1500 ON 03/24/2020 SNG    Total Protein 7.1 6.5 - 8.1 g/dL   Albumin 2.9 (L) 3.5 - 5.0 g/dL   AST 156 (H) 15 - 41 U/L   ALT 64 (H) 0 - 44 U/L    Comment: RESULT CONFIRMED BY MANUAL DILUTION SNG   Alkaline Phosphatase 61 38 - 126 U/L   Total Bilirubin 1.2 0.3 - 1.2 mg/dL   GFR, Estimated 15 (L) >60 mL/min    Comment: (NOTE) Calculated using the CKD-EPI Creatinine Equation (2021)    Anion gap 18 (H) 5 - 15    Comment: Performed at [facility], [address], [place], [place] [address]  Protime-INR     Status: Abnormal   Collection Time:  04/01/2020  1:42 PM  Result Value Ref Range   Prothrombin Time 16.0 (H) 11.4 - 15.2 seconds   INR 1.3 (H) 0.8 - 1.2    Comment: (NOTE) INR goal varies  based on device and disease states. Performed at [facility], [address], [place], [place] [address]   Pathologist smear review     Status: None   Collection Time: 04/07/2020  1:42 PM  Result Value Ref Range   Path Review Blood smear is reviewed.     Comment: There is relative mature lymphocytosis compatible with chronic lymphocytic leukemia. Normocytic anemia and slight thrombocytopenia. Reviewed by Louanne Belton Rodney Cruise, M.D. Performed at [facility], [address] [place], [place] [address]   Lactic acid, plasma     Status: Abnormal   Collection Time: 04/05/2020  1:43 PM  Result Value Ref Range   Lactic Acid, Venous 8.6 (HH) 0.5 - 1.9 mmol/L    Comment: CRITICAL RESULT CALLED TO, READ BACK BY AND VERIFIED WITH TAMMY JUDE RN AT 1500 ON 04/10/2020 SNG Performed at [facility], [address] [place], [place] [address]   Lactic acid, plasma     Status: Abnormal   Collection Time: 03/12/2020  4:01 PM  Result Value Ref Range   Lactic Acid, Venous 5.3 (HH) 0.5 - 1.9 mmol/L    Comment: CRITICAL VALUE NOTED. VALUE IS CONSISTENT WITH PREVIOUSLY REPORTED/CALLED VALUE SNG Performed at [facility], [address] [place], [place] [address]   Resp Panel by RT-PCR (Flu A&B, Covid) Nasopharyngeal Swab     Status: None   Collection Time: 04/02/2020  4:03 PM   Specimen: Nasopharyngeal Swab; Nasopharyngeal(NP) swabs in vial transport medium  Result Value Ref Range   SARS Coronavirus 2 by RT PCR NEGATIVE NEGATIVE    Comment: (NOTE) SARS-CoV-2 target nucleic acids are NOT DETECTED.  The SARS-CoV-2 RNA is generally detectable in upper respiratory specimens during the acute phase of infection. The lowest concentration of SARS-CoV-2 viral copies this assay can detect is 138 copies/mL. A negative result does not preclude SARS-Cov-2 infection and should not be used as the sole basis for treatment or other patient management decisions. A negative result may occur  with  improper specimen collection/handling, submission of specimen other than nasopharyngeal swab, presence of viral mutation(s) within the areas targeted by this assay, and inadequate number of viral copies(<138 copies/mL). A negative result must be combined with clinical observations, patient history, and epidemiological information. The expected result is Negative.  Fact Sheet for Patients:  EntrepreneurPulse.com.au  Fact Sheet for Healthcare Providers:  IncredibleEmployment.be  This test is no t yet approved or cleared by the [place] FDA and  has been authorized for detection and/or diagnosis of SARS-CoV-2 by FDA under an Emergency Use Authorization (EUA). This EUA will remain  in effect (meaning this test can be used) for the duration of the COVID-19 declaration under Section 564(b)(1) of the Act, 21 U.S.C.section 360bbb-3(b)(1), unless the authorization is terminated  or revoked sooner.       Influenza A by PCR NEGATIVE NEGATIVE   Influenza B by PCR NEGATIVE NEGATIVE    Comment: (NOTE) The Xpert Xpress SARS-CoV-2/FLU/RSV plus assay is intended as an aid in the diagnosis of influenza from Nasopharyngeal swab specimens and should not be used as a sole basis for treatment. Nasal washings and aspirates are unacceptable for Xpert Xpress SARS-CoV-2/FLU/RSV testing.  Fact Sheet for Patients: EntrepreneurPulse.com.au  Fact Sheet for Healthcare Providers: IncredibleEmployment.be  This test is not  yet approved or cleared by the [place] and has been authorized for detection and/or diagnosis of SARS-CoV-2 by FDA under an Emergency Use Authorization (EUA). This EUA will remain in effect (meaning this test can be used) for the duration of the COVID-19 declaration under Section 564(b)(1) of the Act, 21 U.S.C. section 360bbb-3(b)(1), unless the authorization is terminated or revoked.  Performed  at [facility], [address] [place], [place] [address]   Glucose, capillary     Status: Abnormal   Collection Time: 03/29/2020  5:08 PM  Result Value Ref Range   Glucose-Capillary 263 (H) 70 - 99 mg/dL    Comment: Glucose reference range applies only to samples taken after fasting for at least 8 hours.  MRSA PCR Screening     Status: None   Collection Time: 04/01/2020  5:09 PM   Specimen: Nasopharyngeal  Result Value Ref Range   MRSA by PCR NEGATIVE NEGATIVE    Comment:        The GeneXpert MRSA Assay (FDA approved for NASAL specimens only), is one component of a comprehensive MRSA colonization surveillance program. It is not intended to diagnose MRSA infection nor to guide or monitor treatment for MRSA infections. Performed at [facility], [address] [place], [place] [address]   Glucose, capillary     Status: Abnormal   Collection Time: 03/18/2020  7:54 PM  Result Value Ref Range   Glucose-Capillary 230 (H) 70 - 99 mg/dL    Comment: Glucose reference range applies only to samples taken after fasting for at least 8 hours.  Potassium     Status: Abnormal   Collection Time: 03/17/2020  9:39 PM  Result Value Ref Range   Potassium 2.4 (LL) 3.5 - 5.1 mmol/L    Comment: CRITICAL RESULT CALLED TO, READ BACK BY AND VERIFIED WITH HAILEY JACOBS ON 04/06/2020 @2229  SKL Performed at [facility], [address], [place], [place] [address]   Magnesium     Status: Abnormal   Collection Time: 03/28/2020  9:39 PM  Result Value Ref Range   Magnesium 1.2 (L) 1.7 - 2.4 mg/dL    Comment: Performed at [facility], [address], [place], [place] [address]  Phosphorus     Status: None   Collection Time: 04/01/2020  9:39 PM  Result Value Ref Range   Phosphorus 3.5 2.5 - 4.6 mg/dL    Comment: Performed at [facility], [address] [place], [place] [address]  Lactic acid, plasma     Status: Abnormal   Collection Time: 04/03/2020  9:39 PM   Result Value Ref Range   Lactic Acid, Venous 6.0 (HH) 0.5 - 1.9 mmol/L    Comment: CRITICAL VALUE NOTED. VALUE IS CONSISTENT WITH PREVIOUSLY REPORTED/CALLED VALUE RH Performed at [facility], [address] [place],  [address]   Blood gas, venous     Status: Abnormal   Collection Time: 03/19/2020 10:57 PM  Result Value Ref Range   pH, Ven 7.17 (LL) 7.250 - 7.430    Comment: CRITICAL RESULT, NOTIFIED PHYSICIAN   pCO2, Ven 45 44.0 - 60.0 mmHg   pO2, Ven 38.0 32.0 - 45.0 mmHg   Bicarbonate 16.4 (L) 20.0 - 28.0 mmol/L   Acid-base deficit 11.6 (H) 0.0 - 2.0 mmol/L   O2 Saturation 55.2 %   Patient temperature 37.0    Collection site LINE    Sample type VENOUS     Comment: Performed at [facility], [address] [place], [place] [address]   CT Head Wo Contrast  Result Date: 03/14/2020 CLINICAL DATA:  Mental status change. EXAM: CT HEAD WITHOUT CONTRAST TECHNIQUE: Contiguous axial images were obtained from the base of the skull through the vertex without intravenous contrast. COMPARISON:  CT head, 02/02/2020 FINDINGS: Brain: No evidence of acute infarction, hemorrhage, hydrocephalus, extra-axial collection or mass lesion/mass effect. Generalized cerebral atrophy with ex vacuo ventricular dilation, not substantially changed from priors. Vascular: Calcific atherosclerosis. No hyperdense vessel identified. Skull: No acute fracture. Sinuses/Orbits: Visualized sinuses are clear.  Unremarkable orbits. Other: No mastoid effusions. IMPRESSION: 1. No evidence of acute intracranial abnormality. 2. Similar chronic microvascular ischemic disease and generalized cerebral volume loss. Electronically Signed   By: Margaretha Sheffield MD   On: 03/14/2020 15:12   DG Chest Portable 1 View  Result Date: 03/12/2020 CLINICAL DATA:  Intubation. EXAM: PORTABLE CHEST 1 VIEW COMPARISON:  CT 02/02/2020.  Chest x-ray 02/02/2020. FINDINGS: Endotracheal tube and NG tube in stable position. Prior  CABG. Heart size normal. Mild bibasilar atelectasis. Mild bibasilar infiltrates cannot be excluded. No pleural effusion or pneumothorax. Old left rib fractures noted. IMPRESSION: 1. Endotracheal tube and NG tube in stable position. 2. Mild bibasilar atelectasis. Mild bibasilar infiltrates cannot be excluded. Electronically Signed   By: Marcello Moores  Register   On: 03/20/2020 13:06    Assessment:  The patient is a 80 y.o. woman with high risk stage III multiple myeloma and CLL who was admitted through the emergency room with presumed sepsis.  She is currently intubated and on pressor support.  She is neutropenic.  She has hypercalcemia.  Plan:   1.  Stage III multiple myeloma  She was briefly on treatment early on in her diagnosis.  She has expressed to multiple providers in the past the desire for no treatment.  Baseline performance status is poor.  Consult palliative care (Josh Borders, NP) in AM to define goals. 2.  Chronic lymphocytic leukemia  Patient's CLL has not required treatment to date.  CBC today reveals lymphocytosis and neutropenia (ANC 700). 3.  Sepsis  Patient admitted with hypotension, fever (104.5) and neutropenia.  She has been pan-cultured and started on broad spectrum antibiotics.  She is currently on pressor support. 4.  Respiratory failure  Patient on ventilator support. 5.  Hypercalcemia  Patient has had recent admissions for hypercalcemia and altered mental status.  Etiology felt secondary to multiple myeloma.  She has received fluids, pamidronate and calcitonin with her last admission.  Creatinine 3.01 with no urine output despite aggressive fluid resuscitation.  Spoke with Dr Holley Raring reguarding treatment options.   She may be a candidate for calcitonin.     Risk for additional renal insult with bisphosphonates.  Unclear if patient is a dialysis candidate given underlying disease (MM and CLL) without treatment. 6.  Code status  DNR   Thank you for allowing me to  participate in Wood 's care.  I will follow her closely with you while hospitalized and after discharge in the outpatient department.   Lequita Asal, MD  03/19/2020, 11:38 PM

## 2020-03-11 NOTE — Consult Note (Signed)
PHARMACY -  BRIEF ANTIBIOTIC NOTE   Pharmacy has received consult(s) for vancomycin & cefepime from an ED provider.  The patient's profile has been reviewed for ht/wt/allergies/indication/available labs.    One time order(s) placed for:  - Vancomycin 1.25g x1 - Cefepime 2g x1 - Metronidazole 500mg  IV x1  Further antibiotics/pharmacy consults should be ordered by admitting physician if indicated.                       Thank you, Lorna Dibble 03/29/2020  12:58 PM

## 2020-03-11 NOTE — Plan of Care (Signed)
Patient admitted from ED intubated, minimally responsive on no sedation. On pressors as well.  Problem: Education: Goal: Knowledge of General Education information will improve Description: Including pain rating scale, medication(s)/side effects and non-pharmacologic comfort measures Outcome: Not Progressing   Problem: Health Behavior/Discharge Planning: Goal: Ability to manage health-related needs will improve Outcome: Not Progressing   Problem: Clinical Measurements: Goal: Ability to maintain clinical measurements within normal limits will improve Outcome: Not Progressing Goal: Will remain free from infection Outcome: Not Progressing Goal: Diagnostic test results will improve Outcome: Not Progressing Goal: Respiratory complications will improve Outcome: Not Progressing Goal: Cardiovascular complication will be avoided Outcome: Not Progressing   Problem: Clinical Measurements: Goal: Will remain free from infection Outcome: Not Progressing   Problem: Activity: Goal: Risk for activity intolerance will decrease Outcome: Not Progressing   Problem: Nutrition: Goal: Adequate nutrition will be maintained Outcome: Not Progressing   Problem: Coping: Goal: Level of anxiety will decrease Outcome: Not Progressing   Problem: Elimination: Goal: Will not experience complications related to bowel motility Outcome: Not Progressing Goal: Will not experience complications related to urinary retention Outcome: Not Progressing   Problem: Pain Managment: Goal: General experience of comfort will improve Outcome: Not Progressing   Problem: Safety: Goal: Ability to remain free from injury will improve Outcome: Not Progressing   Problem: Skin Integrity: Goal: Risk for impaired skin integrity will decrease Outcome: Not Progressing

## 2020-03-11 NOTE — ED Notes (Signed)
Only one set of BC obtained, due to patient acuity, provider notified.   Antibiotics started.

## 2020-03-11 NOTE — Progress Notes (Signed)
Notified bedside nurse of need to draw repeat lactic acid @ 1542.

## 2020-03-11 NOTE — ED Triage Notes (Signed)
Pt brought in via EMS from [facility] for altered mental status x 3 days. Unresponsive on arrival to ED.   Per EMS IO started 100 NS given en route.

## 2020-03-11 NOTE — Progress Notes (Signed)
Following for code sepsis 

## 2020-03-11 NOTE — Progress Notes (Signed)
Per bedside RN labs had to be redrawn, so blood culture redrawn @ 1341 and antibiotics given @ 1253

## 2020-03-11 NOTE — ED Notes (Signed)
Ice packs placed to groin, neck and axillaries.

## 2020-03-11 NOTE — Progress Notes (Signed)
PHARMACY CONSULT NOTE - FOLLOW UP  Pharmacy Consult for Electrolyte Monitoring and Replacement   Recent Labs: Potassium (mmol/L)  Date Value  03/25/2020 2.8 (L)   Magnesium (mg/dL)  Date Value  09/05/2018 2.0   Calcium (mg/dL)  Date Value  03/24/2020 >15.0 (HH)   Albumin (g/dL)  Date Value  04/09/2020 2.9 (L)  06/03/2016 4.3   Sodium (mmol/L)  Date Value  04/03/2020 147 (H)  06/03/2016 138     Assessment: 80 year old female admitted with AMS. Patient with significant hypercalcemia and hypokalemia. Patient with h/o multiple myeloma. Pharmacy to manage electrolytes.  Goal of Therapy:  Electrolytes WNL  Plan:  Potassium 40 mEq per tube x 1 dose in setting of reduced renal function. Recheck potassium tonight at 2200. Will follow up all electrolytes with morning labs.  [name] ,PharmD Clinical Pharmacist 04/05/2020 3:31 PM

## 2020-03-12 ENCOUNTER — Inpatient Hospital Stay: Payer: Medicare Other

## 2020-03-12 ENCOUNTER — Encounter: Payer: Self-pay | Admitting: Internal Medicine

## 2020-03-12 ENCOUNTER — Inpatient Hospital Stay: Payer: Self-pay

## 2020-03-12 DIAGNOSIS — R6521 Severe sepsis with septic shock: Secondary | ICD-10-CM | POA: Diagnosis not present

## 2020-03-12 DIAGNOSIS — A419 Sepsis, unspecified organism: Principal | ICD-10-CM

## 2020-03-12 DIAGNOSIS — Z515 Encounter for palliative care: Secondary | ICD-10-CM | POA: Diagnosis not present

## 2020-03-12 DIAGNOSIS — J9601 Acute respiratory failure with hypoxia: Secondary | ICD-10-CM | POA: Diagnosis not present

## 2020-03-12 LAB — BLOOD GAS, ARTERIAL
Acid-base deficit: 5.4 mmol/L — ABNORMAL HIGH (ref 0.0–2.0)
Bicarbonate: 17.7 mmol/L — ABNORMAL LOW (ref 20.0–28.0)
FIO2: 0.3
MECHVT: 450 mL
Mechanical Rate: 16
O2 Saturation: 99.4 %
PEEP: 5 cmH2O
Patient temperature: 37
pCO2 arterial: 26 mmHg — ABNORMAL LOW (ref 32.0–48.0)
pH, Arterial: 7.44 (ref 7.350–7.450)
pO2, Arterial: 151 mmHg — ABNORMAL HIGH (ref 83.0–108.0)

## 2020-03-12 LAB — BASIC METABOLIC PANEL
Anion gap: 13 (ref 5–15)
BUN: 53 mg/dL — ABNORMAL HIGH (ref 8–23)
CO2: 18 mmol/L — ABNORMAL LOW (ref 22–32)
Calcium: 14.1 mg/dL (ref 8.9–10.3)
Chloride: 117 mmol/L — ABNORMAL HIGH (ref 98–111)
Creatinine, Ser: 2.46 mg/dL — ABNORMAL HIGH (ref 0.44–1.00)
GFR, Estimated: 20 mL/min — ABNORMAL LOW (ref >60)
Glucose, Bld: 120 mg/dL — ABNORMAL HIGH (ref 70–99)
Potassium: 3.4 mmol/L — ABNORMAL LOW (ref 3.5–5.1)
Sodium: 148 mmol/L — ABNORMAL HIGH (ref 135–145)

## 2020-03-12 LAB — CBC
HCT: 34.6 % — ABNORMAL LOW (ref 36.0–46.0)
Hemoglobin: 11.1 g/dL — ABNORMAL LOW (ref 12.0–15.0)
MCH: 29.7 pg (ref 26.0–34.0)
MCHC: 32.1 g/dL (ref 30.0–36.0)
MCV: 92.5 fL (ref 80.0–100.0)
Platelets: UNDETERMINED 10*3/uL (ref 150–400)
RBC: 3.74 MIL/uL — ABNORMAL LOW (ref 3.87–5.11)
RDW: 18.1 % — ABNORMAL HIGH (ref 11.5–15.5)
WBC: 2 10*3/uL — ABNORMAL LOW (ref 4.0–10.5)
nRBC: 1 % — ABNORMAL HIGH (ref 0.0–0.2)

## 2020-03-12 LAB — GLUCOSE, CAPILLARY
Glucose-Capillary: 115 mg/dL — ABNORMAL HIGH (ref 70–99)
Glucose-Capillary: 94 mg/dL (ref 70–99)
Glucose-Capillary: 104 mg/dL — ABNORMAL HIGH (ref 70–99)
Glucose-Capillary: 130 mg/dL — ABNORMAL HIGH (ref 70–99)
Glucose-Capillary: 152 mg/dL — ABNORMAL HIGH (ref 70–99)

## 2020-03-12 LAB — LACTIC ACID, PLASMA: Lactic Acid, Venous: 6.6 mmol/L (ref 0.5–1.9)

## 2020-03-12 LAB — PHOSPHORUS: Phosphorus: 2.3 mg/dL — ABNORMAL LOW (ref 2.5–4.6)

## 2020-03-12 LAB — C DIFFICILE QUICK SCREEN W PCR REFLEX
C Diff antigen: POSITIVE — AB
C Diff toxin: NEGATIVE

## 2020-03-12 LAB — CLOSTRIDIUM DIFFICILE BY PCR, REFLEXED: Toxigenic C. Difficile by PCR: NEGATIVE

## 2020-03-12 LAB — MAGNESIUM: Magnesium: 3.1 mg/dL — ABNORMAL HIGH (ref 1.7–2.4)

## 2020-03-12 MED ORDER — POTASSIUM PHOSPHATES 15 MMOLE/5ML IV SOLN
15.0000 mmol | Freq: Once | INTRAVENOUS | Status: AC
  Administered 2020-03-12: 15 mmol via INTRAVENOUS
  Filled 2020-03-12: qty 5

## 2020-03-12 MED ORDER — LACTATED RINGERS IV SOLN: INTRAVENOUS | Status: DC

## 2020-03-12 MED ORDER — DOCUSATE SODIUM 50 MG/5ML PO LIQD: 100.0000 mg | Freq: Two times a day (BID) | ORAL | Status: DC | PRN

## 2020-03-12 MED ORDER — AMIODARONE IV BOLUS ONLY 150 MG/100ML
150.0000 mg | Freq: Once | INTRAVENOUS | Status: AC
  Administered 2020-03-12: 150 mg via INTRAVENOUS
  Filled 2020-03-12: qty 100

## 2020-03-12 MED ORDER — LACTATED RINGERS IV BOLUS
1000.0000 mL | Freq: Once | INTRAVENOUS | Status: AC
  Administered 2020-03-12: 1000 mL via INTRAVENOUS

## 2020-03-12 MED ORDER — CALCITONIN (SALMON) 200 UNIT/ML IJ SOLN
4.0000 [IU]/kg | Freq: Two times a day (BID) | INTRAMUSCULAR | Status: AC
  Administered 2020-03-12 – 2020-03-13 (×4): 248 [IU] via INTRAMUSCULAR
  Filled 2020-03-12 (×4): qty 1.24

## 2020-03-12 MED ORDER — SODIUM CHLORIDE 0.45 % IV SOLN: INTRAVENOUS | Status: DC

## 2020-03-12 NOTE — Progress Notes (Signed)
Initial Nutrition Assessment  DOCUMENTATION CODES:   Not applicable  INTERVENTION:  If plan is to initiate tube feeds recommend: -Initiate Vital AF 1.2 Cal at 15 mL/hr and advance by 20 mL/hr every 12 hours to goal rate of 55 mL/hr (1320 mL goal daily volume) -Provides 1584 kcal, 99 grams of protein, 1069 mL H2O daily  Monitor magnesium, potassium, and phosphorus daily for at least 3 days, MD to replete as needed, as pt is at risk for refeeding syndrome.  NUTRITION DIAGNOSIS:   Inadequate oral intake related to inability to eat as evidenced by NPO status.  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   3/1 intubated  Patient is currently intubated on ventilator support MV: 11.5 L/min Temp (24hrs), Avg:99.7 F (37.6 C), Min:97.7 F (36.5 C), Max:104.6 F (40.3 C)  Medications reviewed and include: Novolog 0-15 units Q4hrs, Protonix, famotidine, norepinephrine gtt at 8 mcg/min, potassium phosphate 15 mmol IV once today.  Labs reviewed: CBG 94-105, Sodium 148, Potassium 3.4, Chloride 117, CO2 18, BUN 53, Creatinine 2.46, Calcium 14.1, Phosphorus 2.3, Magnesium 3.1.  I/O: 635 mL UOP yesterday  Weight trend: pt documented to be 71.8 kg on 05/07/2019 and is currently 62.2 kg (137.13 lbs); she has lost 9.6 kg (13.3% body weight) over the past 10 months, which is not significant for time frame but is still concerning  Enteral Access: 16 Fr. OGT placed 3/1; terminates in gastric fundus per chest x-ray 3/2  Discussed with RN and on rounds. Plan is to hold off on initiation of  tube feeds today. Palliative medicine has been consulted and is discussing goals of care with patient's son. Family not available at bedside at time of RD assessment. Patient is at risk for malnutrition. Unable to determine if she meets criteria for malnutrition at this time.  NUTRITION - FOCUSED PHYSICAL EXAM:  Flowsheet Row Most Recent Value  Orbital Region No depletion  Upper Arm Region No depletion  Thoracic and Lumbar Region No depletion  Buccal Region Unable to assess  Temple Region No depletion  Clavicle Bone Region No depletion  Clavicle and Acromion Bone Region Mild depletion  Scapular Bone Region Unable to assess  Dorsal Hand Mild depletion  Patellar Region Moderate depletion  Anterior Thigh Region Moderate depletion  Posterior Calf Region Moderate depletion  Edema (RD Assessment) Mild  Hair Reviewed  Eyes Unable to assess  Mouth Unable to assess  Skin Reviewed  Nails Reviewed     Diet Order:   Diet Order            Diet NPO time specified  Diet effective now                EDUCATION NEEDS:   No education needs have been identified at this time  Skin:  Skin Assessment: Reviewed RN Assessment  Last BM:  03/12/2020 - medium type 7  Height:   Ht Readings from Last 1 Encounters:  04/10/2020 5' 2"  (1.575 m)   Weight:   Wt Readings from Last 1 Encounters:  03/12/20 62.2 kg   BMI:  Body mass index  is 25.08 kg/m.  Estimated Nutritional Needs:   Kcal:  7207  Protein:  85-100 grams  Fluid:  1.5 L/day  Jacklynn Barnacle, MS, RD, LDN Pager number available on Amion

## 2020-03-12 NOTE — Progress Notes (Signed)
[facility] Hematology/Oncology Progress Note  Date of admission: 03/20/2020  Hospital day:  03/12/2020  Chief Complaint: [name] is a 80 y.o. 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  Subjective: Patient remains intubated and sedated.  She withdraws to pain and is moving all 4 extremities..  Social History: The patient is accompanied by accompanied by her nurse today.  I later spoke to the patient's son and brother at the bedside.  Allergies:  Allergies  Allergen Reactions  . Benzodiazepines Anxiety and Other (See Comments)    Confusion, also  . Iodinated Diagnostic Agents Swelling    "Head swells"  . Other Swelling    Smoke--Head swelling  . Statins Swelling    "Head swells"  . Ticagrelor Shortness Of Breath  . Metoclopramide Other (See Comments)    Causes tremors and insomnia  . Amoxicillin Hives and Rash  . Codeine Hives and Rash  . Duloxetine Nausea Only  . Sulfa Antibiotics Hives and Rash    Scheduled Medications: . calcitonin  4 Units/kg Intramuscular BID  . chlorhexidine gluconate (MEDLINE KIT)  15 mL Mouth Rinse BID  . Chlorhexidine Gluconate Cloth  6 each Topical Daily  . enoxaparin (LOVENOX) injection  30 mg Subcutaneous Q24H  . influenza vaccine adjuvanted  0.5 mL Intramuscular Tomorrow-1000  . insulin aspart  0-15 Units Subcutaneous Q4H  . mouth rinse  15 mL Mouth Rinse 10 times per day  . pantoprazole (PROTONIX) IV  40 mg Intravenous Q24H  . pneumococcal 23 valent vaccine  0.5 mL Intramuscular Tomorrow-1000  . sodium chloride flush  3 mL Intravenous Q12H    Review of Systems: Unable to be obtained as patient intubated and ventilated. GENERAL:  Febrile. PERFORMANCE STATUS (ECOG):   4 GI:  Diarrhea. GU:  Increased urine output. Neuro:  Withdraws to pain in all 4 extremities with finger/toe squeeze.   Physical Exam: Blood pressure (!) 137/47, pulse (!) 113, temperature 100.04 F (37.8 C), resp. rate (!) 28, height 5' 2.01" (1.575 m), weight 137 lb 2 oz (62.2 kg), SpO2 100 %.  GENERAL:  Elderly woman intubated and ventilated in the ICU. MENTAL STATUS:  Unresponsive except to painful stimuli. HEAD:  Pearline Cables hair.  Normocephalic, atraumatic, face symmetric, no Cushingoid features. EYES:  Pupils equal round.  No conjunctivitis or scleral icterus. ENT:  Orally intubated.  NG tube in place.  RESPIRATORY:  Ventilated breaths.  Clear to auscultation without rales, wheezes or rhonchi. CARDIOVASCULAR: Tachycardic without murmur, rub or gallop. ABDOMEN:  Active bowel sounds and no hepatosplenomegaly.  No masses. SKIN:  No rashes, ulcers or lesions. EXTREMITIES: 1+ edema. NEUROLOGICAL: Unresponsive.   Results for orders placed or performed during the hospital encounter of 03/29/2020 (from the past 48 hour(s))  Culture, blood (Routine x 2)     Status: None (Preliminary result)   Collection Time: 04/02/2020 12:02 PM   Specimen: BLOOD  Result Value Ref Range   Specimen Description BLOOD RIGHT AC    Special Requests      BOTTLES DRAWN AEROBIC AND ANAEROBIC Blood Culture adequate volume   Culture      NO GROWTH < 24 HOURS Performed at [facility], [address], [place], [place] [address]    Report Status PENDING   Hemoglobin A1c  Status: Abnormal   Collection Time: 03/29/2020 12:02 PM  Result Value Ref Range   Hgb A1c MFr Bld 6.2 (H) 4.8 - 5.6 %    Comment: (NOTE) Pre diabetes:          5.7%-6.4%  Diabetes:              >6.4%  Glycemic control for   <7.0% adults with diabetes    Mean Plasma Glucose 131.24 mg/dL    Comment: Performed at [facility] [address] [place], [place] [address]  Urinalysis, Complete w Microscopic Urine, Catheterized      Status: Abnormal   Collection Time: 04/07/2020 12:23 PM  Result Value Ref Range   Color, Urine YELLOW (A) YELLOW   APPearance TURBID (A) CLEAR   Specific Gravity, Urine 1.017 1.005 - 1.030   pH 5.0 5.0 - 8.0   Glucose, UA NEGATIVE NEGATIVE mg/dL   Hgb urine dipstick SMALL (A) NEGATIVE   Bilirubin Urine NEGATIVE NEGATIVE   Ketones, ur NEGATIVE NEGATIVE mg/dL   Protein, ur 100 (A) NEGATIVE mg/dL   Nitrite NEGATIVE NEGATIVE   Leukocytes,Ua SMALL (A) NEGATIVE   RBC / HPF 6-10 0 - 5 RBC/hpf   WBC, UA 6-10 0 - 5 WBC/hpf   Bacteria, UA MANY (A) NONE SEEN   Squamous Epithelial / LPF 6-10 0 - 5   Amorphous Crystal PRESENT     Comment: Performed at [facility], [address] [place], [place] [address]  Blood gas, arterial     Status: Abnormal   Collection Time: 03/14/2020  1:06 PM  Result Value Ref Range   FIO2 0.60    Mode ASSIST CONTROL    VT 450 mL   LHR 16 resp/min   Peep/cpap 5.0 cm H20   pH, Arterial 7.40 7.350 - 7.450   pCO2 arterial 33 32.0 - 48.0 mmHg   pO2, Arterial 133 (H) 83.0 - 108.0 mmHg   Bicarbonate 20.4 20.0 - 28.0 mmol/L   Acid-base deficit 3.8 (H) 0.0 - 2.0 mmol/L   O2 Saturation 99.0 %   Patient temperature 37.0    Collection site RIGHT RADIAL    Sample type ARTERIAL DRAW    Allens test (pass/fail) PASS PASS    Comment: Performed at [facility], [address] [place], [place] [address]  Culture, blood (Routine x 2)     Status: None (Preliminary result)   Collection Time: 03/12/2020  1:41 PM   Specimen: BLOOD  Result Value Ref Range   Specimen Description BLOOD LEFT WRIST    Special Requests      BOTTLES DRAWN AEROBIC AND ANAEROBIC Blood Culture results may not be optimal due to an inadequate volume of blood received in culture bottles   Culture      NO GROWTH < 24 HOURS Performed at [facility], [address] [place], [place] [address]    Report Status PENDING   CBC with Differential/Platelet     Status: Abnormal    Collection Time: 03/30/2020  1:42 PM  Result Value Ref Range   WBC 5.6 4.0 - 10.5 K/uL   RBC 4.12 3.87 - 5.11 MIL/uL   Hemoglobin 11.8 (L) 12.0 - 15.0 g/dL   HCT 40.2 36.0 - 46.0 %   MCV 97.6 80.0 - 100.0 fL   MCH 28.6 26.0 - 34.0 pg   MCHC 29.4 (L) 30.0 - 36.0 g/dL   RDW 18.0 (H) 11.5 - 15.5 %   Platelets 127 (L) 150 -  400 K/uL   nRBC 1.6 (H) 0.0 - 0.2 %   Neutrophils Relative % 12 %   Neutro Abs 0.7 (L) 1.7 - 7.7 K/uL   Lymphocytes Relative 77 %   Lymphs Abs 4.3 (H) 0.7 - 4.0 K/uL   Monocytes Relative 11 %   Monocytes Absolute 0.6 0.1 - 1.0 K/uL   Eosinophils Relative 0 %   Eosinophils Absolute 0.0 0.0 - 0.5 K/uL   Basophils Relative 0 %   Basophils Absolute 0.0 0.0 - 0.1 K/uL   WBC Morphology ABSOLUTE LYMPHOCYTOSIS    RBC Morphology MIXED RBC POPULATION    Smear Review Normal platelet morphology    Immature Granulocytes 0 %   Abs Immature Granulocytes 0.00 0.00 - 0.07 K/uL   Polychromasia PRESENT     Comment: Performed at [facility], [address] [place], [place] [address]  Comprehensive metabolic panel     Status: Abnormal   Collection Time: 03/16/2020  1:42 PM  Result Value Ref Range   Sodium 147 (H) 135 - 145 mmol/L   Potassium 2.8 (L) 3.5 - 5.1 mmol/L   Chloride 113 (H) 98 - 111 mmol/L   CO2 16 (L) 22 - 32 mmol/L   Glucose, Bld 259 (H) 70 - 99 mg/dL    Comment: Glucose reference range applies only to samples taken after fasting for at least 8 hours.   BUN 56 (H) 8 - 23 mg/dL   Creatinine, Ser 3.01 (H) 0.44 - 1.00 mg/dL   Calcium >15.0 (HH) 8.9 - 10.3 mg/dL    Comment: CRITICAL RESULT CALLED TO, READ BACK BY AND VERIFIED WITH TAMMY JUDE RN AT 1500 ON 04/10/2020 SNG    Total Protein 7.1 6.5 - 8.1 g/dL   Albumin 2.9 (L) 3.5 - 5.0 g/dL   AST 156 (H) 15 - 41 U/L   ALT 64 (H) 0 - 44 U/L    Comment: RESULT CONFIRMED BY MANUAL DILUTION SNG   Alkaline Phosphatase 61 38 - 126 U/L   Total Bilirubin 1.2 0.3 - 1.2 mg/dL   GFR, Estimated 15 (L) >60 mL/min     Comment: (NOTE) Calculated using the CKD-EPI Creatinine Equation (2021)    Anion gap 18 (H) 5 - 15    Comment: Performed at [facility], [address] [place], [place] [address]  Protime-INR     Status: Abnormal   Collection Time: 03/22/2020  1:42 PM  Result Value Ref Range   Prothrombin Time 16.0 (H) 11.4 - 15.2 seconds   INR 1.3 (H) 0.8 - 1.2    Comment: (NOTE) INR goal varies based on device and disease states. Performed at [facility], [address], [place], [place] [address]   Pathologist smear review     Status: None   Collection Time: 04/05/2020  1:42 PM  Result Value Ref Range   Path Review Blood smear is reviewed.     Comment: There is relative mature lymphocytosis compatible with chronic lymphocytic leukemia. Normocytic anemia and slight thrombocytopenia. Reviewed by Louanne Belton Rodney Cruise, M.D. Performed at [facility], [place], [place] [address]   Lactic acid, plasma     Status: Abnormal   Collection Time: 03/13/2020  1:43 PM  Result Value Ref Range   Lactic Acid, Venous 8.6 (HH) 0.5 - 1.9 mmol/L    Comment: CRITICAL RESULT CALLED TO, READ BACK BY AND VERIFIED WITH TAMMY JUDE RN AT 1500 ON 04/02/2020 SNG Performed at [facility], [address],  [place], [place] [address]   Lactic acid, plasma     Status: Abnormal   Collection Time: 04/06/2020  4:01 PM  Result Value Ref Range   Lactic Acid, Venous 5.3 (HH) 0.5 - 1.9 mmol/L    Comment: CRITICAL VALUE NOTED. VALUE IS CONSISTENT WITH PREVIOUSLY REPORTED/CALLED VALUE SNG Performed at [facility], [address] [place], [place] [address]   Resp Panel by RT-PCR (Flu A&B, Covid) Nasopharyngeal Swab     Status: None   Collection Time: 03/18/2020  4:03 PM   Specimen: Nasopharyngeal Swab; Nasopharyngeal(NP) swabs in vial transport medium  Result Value Ref Range   SARS Coronavirus 2 by RT PCR NEGATIVE NEGATIVE    Comment: (NOTE) SARS-CoV-2 target nucleic acids are  NOT DETECTED.  The SARS-CoV-2 RNA is generally detectable in upper respiratory specimens during the acute phase of infection. The lowest concentration of SARS-CoV-2 viral copies this assay can detect is 138 copies/mL. A negative result does not preclude SARS-Cov-2 infection and should not be used as the sole basis for treatment or other patient management decisions. A negative result may occur with  improper specimen collection/handling, submission of specimen other than nasopharyngeal swab, presence of viral mutation(s) within the areas targeted by this assay, and inadequate number of viral copies(<138 copies/mL). A negative result must be combined with clinical observations, patient history, and epidemiological information. The expected result is Negative.  Fact Sheet for Patients:  EntrepreneurPulse.com.au  Fact Sheet for Healthcare Providers:  IncredibleEmployment.be  This test is no t yet approved or cleared by the [place] FDA and  has been authorized for detection and/or diagnosis of SARS-CoV-2 by FDA under an Emergency Use Authorization (EUA). This EUA will remain  in effect (meaning this test can be used) for the duration of the COVID-19 declaration under Section 564(b)(1) of the Act, 21 U.S.C.section 360bbb-3(b)(1), unless the authorization is terminated  or revoked sooner.       Influenza A by PCR NEGATIVE NEGATIVE   Influenza B by PCR NEGATIVE NEGATIVE    Comment: (NOTE) The Xpert Xpress SARS-CoV-2/FLU/RSV plus assay is intended as an aid in the diagnosis of influenza from Nasopharyngeal swab specimens and should not be used as a sole basis for treatment. Nasal washings and aspirates are unacceptable for Xpert Xpress SARS-CoV-2/FLU/RSV testing.  Fact Sheet for Patients: EntrepreneurPulse.com.au  Fact Sheet for Healthcare Providers: IncredibleEmployment.be  This test is not yet approved  or cleared by the [place] FDA and has been authorized for detection and/or diagnosis of SARS-CoV-2 by FDA under an Emergency Use Authorization (EUA). This EUA will remain in effect (meaning this test can be used) for the duration of the COVID-19 declaration under Section 564(b)(1) of the Act, 21 U.S.C. section 360bbb-3(b)(1), unless the authorization is terminated or revoked.  Performed at [facility], [address] [place], [place] [address]   Glucose, capillary     Status: Abnormal   Collection Time: 03/28/2020  5:08 PM  Result Value Ref Range   Glucose-Capillary 263 (H) 70 - 99 mg/dL    Comment: Glucose reference range applies only to samples taken after fasting for at least 8 hours.  MRSA PCR Screening     Status: None   Collection Time: 03/14/2020  5:09 PM   Specimen: Nasopharyngeal  Result Value Ref Range   MRSA by PCR NEGATIVE NEGATIVE    Comment:        The GeneXpert MRSA Assay (FDA approved for NASAL specimens only), is one component of a comprehensive MRSA  colonization surveillance program. It is not intended to diagnose MRSA infection nor to guide or monitor treatment for MRSA infections. Performed at [facility], [address] [place], [place] [address]   Glucose, capillary     Status: Abnormal   Collection Time: 03/23/2020  7:54 PM  Result Value Ref Range   Glucose-Capillary 230 (H) 70 - 99 mg/dL    Comment: Glucose reference range applies only to samples taken after fasting for at least 8 hours.  Potassium     Status: Abnormal   Collection Time: 03/22/2020  9:39 PM  Result Value Ref Range   Potassium 2.4 (LL) 3.5 - 5.1 mmol/L    Comment: CRITICAL RESULT CALLED TO, READ BACK BY AND VERIFIED WITH HAILEY JACOBS ON 04/05/2020 @2229  SKL Performed at [facility], [address] [place], [place] [address]   Magnesium     Status: Abnormal   Collection Time: 03/26/2020  9:39 PM  Result Value Ref Range   Magnesium 1.2 (L) 1.7 - 2.4  mg/dL    Comment: Performed at [facility], [address] [place], [place] [address]  Phosphorus     Status: None   Collection Time: 04/10/2020  9:39 PM  Result Value Ref Range   Phosphorus 3.5 2.5 - 4.6 mg/dL    Comment: Performed at [facility], [address] [place], [place] [address]  Lactic acid, plasma     Status: Abnormal   Collection Time: 03/18/2020  9:39 PM  Result Value Ref Range   Lactic Acid, Venous 6.0 (HH) 0.5 - 1.9 mmol/L    Comment: CRITICAL VALUE NOTED. VALUE IS CONSISTENT WITH PREVIOUSLY REPORTED/CALLED VALUE RH Performed at [facility], [address] [place], [place] [address]   Blood gas, venous     Status: Abnormal   Collection Time: 03/26/2020 10:57 PM  Result Value Ref Range   pH, Ven 7.17 (LL) 7.250 - 7.430    Comment: CRITICAL RESULT, NOTIFIED PHYSICIAN   pCO2, Ven 45 44.0 - 60.0 mmHg   pO2, Ven 38.0 32.0 - 45.0 mmHg   Bicarbonate 16.4 (L) 20.0 - 28.0 mmol/L   Acid-base deficit 11.6 (H) 0.0 - 2.0 mmol/L   O2 Saturation 55.2 %   Patient temperature 37.0    Collection site LINE    Sample type VENOUS     Comment: Performed at [facility], [address] [place], [place] [address]  Glucose, capillary     Status: Abnormal   Collection Time: 03/28/2020 11:49 PM  Result Value Ref Range   Glucose-Capillary 157 (H) 70 - 99 mg/dL    Comment: Glucose reference range applies only to samples taken after fasting for at least 8 hours.  Glucose, capillary     Status: Abnormal   Collection Time: 03/12/20  3:51 AM  Result Value Ref Range   Glucose-Capillary 115 (H) 70 - 99 mg/dL    Comment: Glucose reference range applies only to samples taken after fasting for at least 8 hours.  Basic metabolic panel     Status: Abnormal   Collection Time: 03/12/20  4:41 AM  Result Value Ref Range   Sodium 148 (H) 135 - 145 mmol/L   Potassium 3.4 (L) 3.5 - 5.1 mmol/L   Chloride 117 (H) 98 - 111 mmol/L   CO2 18 (L) 22 - 32 mmol/L    Glucose, Bld 120 (H) 70 - 99 mg/dL    Comment: Glucose reference range applies only to samples taken after fasting for  at least 8 hours.   BUN 53 (H) 8 - 23 mg/dL   Creatinine, Ser 2.46 (H) 0.44 - 1.00 mg/dL   Calcium 14.1 (HH) 8.9 - 10.3 mg/dL    Comment: CRITICAL RESULT CALLED TO, READ BACK BY AND VERIFIED WITH RENIER ESPERANO AT 0521 ON 03/12/2020 [facility].    GFR, Estimated 20 (L) >60 mL/min    Comment: (NOTE) Calculated using the CKD-EPI Creatinine Equation (2021)    Anion gap 13 5 - 15    Comment: Performed at [facility], [address] [place], [place] [address]  CBC     Status: Abnormal   Collection Time: 03/12/20  4:41 AM  Result Value Ref Range   WBC 2.0 (L) 4.0 - 10.5 K/uL   RBC 3.74 (L) 3.87 - 5.11 MIL/uL   Hemoglobin 11.1 (L) 12.0 - 15.0 g/dL   HCT 34.6 (L) 36.0 - 46.0 %   MCV 92.5 80.0 - 100.0 fL   MCH 29.7 26.0 - 34.0 pg   MCHC 32.1 30.0 - 36.0 g/dL   RDW 18.1 (H) 11.5 - 15.5 %   Platelets PLATELET CLUMPS NOTED ON SMEAR, UNABLE TO ESTIMATE 150 - 400 K/uL    Comment: Immature Platelet Fraction may be clinically indicated, consider ordering this additional test [id number]    nRBC 1.0 (H) 0.0 - 0.2 %    Comment: Performed at [facility], [address] [place], [place] [address]  Magnesium     Status: Abnormal   Collection Time: 03/12/20  4:41 AM  Result Value Ref Range   Magnesium 3.1 (H) 1.7 - 2.4 mg/dL    Comment: Performed at [facility], [address], [place], [place] [address]  Phosphorus     Status: Abnormal   Collection Time: 03/12/20  4:41 AM  Result Value Ref Range   Phosphorus 2.3 (L) 2.5 - 4.6 mg/dL    Comment: Performed at [facility], [address] [place], [place] [address]  Lactic acid, plasma     Status: Abnormal   Collection Time: 03/12/20  4:41 AM  Result Value Ref Range   Lactic Acid, Venous 6.6 (HH) 0.5 - 1.9 mmol/L    Comment: CRITICAL VALUE NOTED. VALUE IS CONSISTENT WITH PREVIOUSLY  REPORTED/CALLED VALUE...[facility] Performed at [facility], [address] [place], [place] [address]   Blood gas, arterial     Status: Abnormal   Collection Time: 03/12/20  5:00 AM  Result Value Ref Range   FIO2 0.30    Delivery systems VENTILATOR    Mode PRESSURE REGULATED VOLUME CONTROL    VT 450 mL   Peep/cpap 5.0 cm H20   pH, Arterial 7.44 7.350 - 7.450   pCO2 arterial 26 (L) 32.0 - 48.0 mmHg   pO2, Arterial 151 (H) 83.0 - 108.0 mmHg   Bicarbonate 17.7 (L) 20.0 - 28.0 mmol/L   Acid-base deficit 5.4 (H) 0.0 - 2.0 mmol/L   O2 Saturation 99.4 %   Patient temperature 37.0    Collection site RIGHT RADIAL    Sample type ARTERIAL DRAW    Allens test (pass/fail) PASS PASS   Mechanical Rate 16     Comment: Performed at [facility], [address] [place], [place] [address]  Glucose, capillary     Status: None   Collection Time: 03/12/20  7:19 AM  Result Value Ref Range   Glucose-Capillary 94 70 - 99 mg/dL    Comment: Glucose reference range applies only to samples taken after fasting  for at least 8 hours.  C Difficile Quick Screen w PCR reflex     Status: Abnormal   Collection Time: 03/12/20  8:42 AM   Specimen: STOOL  Result Value Ref Range   C Diff antigen POSITIVE (A) NEGATIVE   C Diff toxin NEGATIVE NEGATIVE   C Diff interpretation Results are indeterminate. See PCR results.     Comment: Performed at [facility], [address] [place], [place] [address]  C. Diff by PCR, Reflexed     Status: None   Collection Time: 03/12/20  8:42 AM  Result Value Ref Range   Toxigenic C. Difficile by PCR NEGATIVE NEGATIVE    Comment: Patient is colonized with non toxigenic C. difficile. May not need treatment unless significant symptoms are present. Performed at [facility], [address] [place], [place] [address]   Glucose, capillary     Status: Abnormal   Collection Time: 03/12/20 10:59 AM  Result Value Ref Range   Glucose-Capillary 104 (H)  70 - 99 mg/dL    Comment: Glucose reference range applies only to samples taken after fasting for at least 8 hours.  Glucose, capillary     Status: Abnormal   Collection Time: 03/12/20  3:47 PM  Result Value Ref Range   Glucose-Capillary 130 (H) 70 - 99 mg/dL    Comment: Glucose reference range applies only to samples taken after fasting for at least 8 hours.  Glucose, capillary     Status: Abnormal   Collection Time: 03/12/20  7:38 PM  Result Value Ref Range   Glucose-Capillary 152 (H) 70 - 99 mg/dL    Comment: Glucose reference range applies only to samples taken after fasting for at least 8 hours.   CT Head Wo Contrast  Result Date: 04/09/2020 CLINICAL DATA:  Mental status change. EXAM: CT HEAD WITHOUT CONTRAST TECHNIQUE: Contiguous axial images were obtained from the base of the skull through the vertex without intravenous contrast. COMPARISON:  CT head, 02/02/2020 FINDINGS: Brain: No evidence of acute infarction, hemorrhage, hydrocephalus, extra-axial collection or mass lesion/mass effect. Generalized cerebral atrophy with ex vacuo ventricular dilation, not substantially changed from priors. Vascular: Calcific atherosclerosis. No hyperdense vessel identified. Skull: No acute fracture. Sinuses/Orbits: Visualized sinuses are clear.  Unremarkable orbits. Other: No mastoid effusions. IMPRESSION: 1. No evidence of acute intracranial abnormality. 2. Similar chronic microvascular ischemic disease and generalized cerebral volume loss. Electronically Signed   By: Margaretha Sheffield MD   On: 03/22/2020 15:12   DG Chest Port 1 View  Result Date: 03/12/2020 CLINICAL DATA:  Respiratory failure EXAM: PORTABLE CHEST 1 VIEW COMPARISON:  03/31/2020 FINDINGS: Endotracheal tube is seen 4 cm above the carina. Nasogastric tube extends into the gastric fundus. Developing bibasilar opacities, right greater than left, in keeping with atelectasis or focal infiltrate. No pneumothorax or pleural effusion. Coronary artery  bypass grafting and coronary artery stenting has been performed. The pulmonary vascularity is normal. IMPRESSION: Progressive bibasilar atelectasis or infiltrate. Stable support tubes. Electronically Signed   By: Fidela Salisbury MD   On: 03/12/2020 04:42   DG Chest Portable 1 View  Result Date: 03/25/2020 CLINICAL DATA:  Intubation. EXAM: PORTABLE CHEST 1 VIEW COMPARISON:  CT 02/02/2020.  Chest x-ray 02/02/2020. FINDINGS: Endotracheal tube and NG tube in stable position. Prior CABG. Heart size normal. Mild bibasilar atelectasis. Mild bibasilar infiltrates cannot be excluded. No pleural effusion or pneumothorax. Old left rib fractures noted. IMPRESSION: 1. Endotracheal tube and NG tube in stable position. 2. Mild bibasilar atelectasis.  Mild bibasilar infiltrates cannot be excluded. Electronically Signed   By: Marcello Moores  Register   On: 03/14/2020 13:06   [place] EKG SITE RITE  Result Date: 03/12/2020 If Site Rite image not attached, placement could not be confirmed due to current cardiac rhythm.   Assessment:  Sarina Robleto Vereen is a 80 y.o. female with high risk stage III multiple myeloma and CLL who was admitted through the emergency room with presumed sepsis.    She remains intubated and on pressor support.  She is unresponsive except to painful stimuli.  She remains neutropenic.  Calcium is 14.1.  Plan:   1.  Stage III multiple myeloma             She was briefly on treatment early on in her diagnosis.             She has expressed to multiple providers in the past the desire for no treatment.             Baseline performance status is poor.  Long discussion with patient's son and her brother today regarding treatment options for multiple myeloma.   She is not a good candidate for treatment secondary to her performance status.   Potential side effects of treatment were reviewed.   Patient's son would like to talk to Dr. Laverta [place], her oncologist.      I will contact him in the morning to update him and  ensure he reaches out to the patient's son.             Appreciate palliative care consult. 2.  Chronic lymphocytic leukemia             Hematocrit 34.6.  Hemoglobin 11.1.  Platelets clumped.  WBC 2000.  Patient's CLL has not required treatment to date.             She remains neutropenic.  Continue neutropenic precautions.  Follow CBC with diff daily. 3.  Sepsis             Patient admitted with hypotension, fever (104.5) and neutropenia.             She was pan-cultured and started on broad spectrum antibiotics.   Cultures negative to date.              She remains on pressor support. 4.  Respiratory failure             Patient on ventilator support. 5.  Hypercalcemia             Calcium > 15 on admission.  Calcium 14.1 today.  Patient has had recent admissions for hypercalcemia and altered mental status.              Etiology felt secondary to multiple myeloma.              She received fluids, pamidronate and calcitonin with her last admission.             Creatinine 3.01 on admission and 2.46 today.             Appreciate nephrology consult.             There are limited options given her current functional status, fragility and critical ill status.  Dialysis felt to be only a temporizing measure as hypercalcemia likely to reoccur once dialysis stopped.  Hypercalcemia will also recur without treatment of her underlying disease. 6.   Diarrhea  Stool was C diff antigen + but toxin -.  C diff PCR was negative. 7.  Code status             DNR     Approximately 25-30 minutes were spent face to face with the patient and her family and greater than 50% of the visit was spent in counseling and coordination of care     Lequita Asal, MD  03/12/2020, 9:08 PM

## 2020-03-12 NOTE — Progress Notes (Signed)
CRITICAL CARE NOTE 80 y.o.femalethat arrives via EMS  found unresponsive at her long-term care facility x 24 hrs Patient arrives unresponsive with agonal respirations. Is confirmed with EMS that patient is a full code and patient was immediately intubated for airway protection.  Patient with history of Multiple myeloma-presented also with acute and severe hypotension, severe resp failure, severe acidosis, severe hypercalcemia, with severe renal failure   ER COURSE Patient with diagnosis of multiorgan failure  Patient given 8L of IVF's Placed on MV support on pressors   HOSPITAL COURSE/EVENTS 3/1 admission to ICU severe multiorgan failure 3/2 multiorgan failure     CC  follow up respiratory failure  SUBJECTIVE Patient remains critically ill Prognosis is guarded Unresponsive   Vent Mode: PRVC FiO2 (%):  [30 %-100 %] 30 % Set Rate:  [16 bmp] 16 bmp Vt Set:  [450 mL] 450 mL PEEP:  [5 cmH20] 5 cmH20 Plateau Pressure:  [22 cmH20] 22 cmH20  CBC    Component Value Date/Time   WBC 2.0 (L) 03/12/2020 0441   RBC 3.74 (L) 03/12/2020 0441   HGB 11.1 (L) 03/12/2020 0441   HGB 10.9 (L) 06/03/2016 1102   HCT 34.6 (L) 03/12/2020 0441   HCT 32.8 (L) 10/06/2016 1608   PLT PLATELET CLUMPS NOTED ON SMEAR, UNABLE TO ESTIMATE 03/12/2020 0441   PLT 256 06/03/2016 1102   MCV 92.5 03/12/2020 0441   MCV 83 06/03/2016 1102   MCH 29.7 03/12/2020 0441   MCHC 32.1 03/12/2020 0441   RDW 18.1 (H) 03/12/2020 0441   RDW 15.8 (H) 06/03/2016 1102   LYMPHSABS 4.3 (H) 03/21/2020 1342   LYMPHSABS 6.5 (H) 06/03/2016 1102   MONOABS 0.6 04/01/2020 1342   EOSABS 0.0 04/03/2020 1342   EOSABS 0.2 06/03/2016 1102   BASOSABS 0.0 03/20/2020 1342   BASOSABS 0.0 06/03/2016 1102   BMP Latest Ref Rng & Units 03/12/2020 04/08/2020 04/07/2020  Glucose 70 - 99 mg/dL 120(H) - 259(H)  BUN 8 - 23 mg/dL 53(H) - 56(H)  Creatinine 0.44 - 1.00 mg/dL 2.46(H) - 3.01(H)  BUN/Creat Ratio 12 - 28 - - -  Sodium 135  - 145 mmol/L 148(H) - 147(H)  Potassium 3.5 - 5.1 mmol/L 3.4(L) 2.4(LL) 2.8(L)  Chloride 98 - 111 mmol/L 117(H) - 113(H)  CO2 22 - 32 mmol/L 18(L) - 16(L)  Calcium 8.9 - 10.3 mg/dL 14.1(HH) - >15.0(HH)    BP (!) 164/52   Pulse (!) 125   Temp 99.68 F (37.6 C)   Resp (!) 35   Ht 5' 2"  (1.575 m)   Wt 62.2 kg   SpO2 97%   BMI 25.08 kg/m    I/O last 3 completed shifts: In: 8400.2 [I.V.:4167; IV Piggyback:4233.3] Out: 735 [Urine:635; Stool:100] No intake/output data recorded.  SpO2: 97 % FiO2 (%): 30 %  Estimated body mass index is 25.08 kg/m as calculated from the following:   Height as of this encounter: 5' 2"  (1.575 m).   Weight as of this encounter: 62.2 kg.  SIGNIFICANT EVENTS   REVIEW OF SYSTEMS  PATIENT IS UNABLE TO PROVIDE COMPLETE REVIEW OF SYSTEMS DUE TO SEVERE CRITICAL ILLNESS        PHYSICAL EXAMINATION:  GENERAL:critically ill appearing, +resp distress EYES: Pupils equal, round, reactive to light.  No scleral icterus.  MOUTH: Moist mucosal membrane. NECK: Supple.  PULMONARY: +rhonchi, +wheezing CARDIOVASCULAR: S1 and S2. Regular rate and rhythm. No murmurs, rubs, or gallops.  GASTROINTESTINAL: Soft, nontender, -distended.  Positive bowel sounds.   MUSCULOSKELETAL: No swelling,  clubbing, or edema.  NEUROLOGIC: obtunded, GCS<8 SKIN:intact,warm,dry  MEDICATIONS: I have reviewed all medications and confirmed regimen as documented   CULTURE RESULTS   Recent Results (from the past 240 hour(s))  Culture, blood (Routine x 2)     Status: None (Preliminary result)   Collection Time: 03/17/2020 12:02 PM   Specimen: BLOOD  Result Value Ref Range Status   Specimen Description BLOOD RIGHT [facility]  Final   Special Requests   Final    BOTTLES DRAWN AEROBIC AND ANAEROBIC Blood Culture adequate volume   Culture   Final    NO GROWTH < 24 HOURS Performed at [facility], [address], [place], [place] [address]    Report Status PENDING  Incomplete   Culture, blood (Routine x 2)     Status: None (Preliminary result)   Collection Time: 03/24/2020  1:41 PM   Specimen: BLOOD  Result Value Ref Range Status   Specimen Description BLOOD LEFT WRIST  Final   Special Requests   Final    BOTTLES DRAWN AEROBIC AND ANAEROBIC Blood Culture results may not be optimal due to an inadequate volume of blood received in culture bottles   Culture   Final    NO GROWTH < 24 HOURS Performed at [facility], [address] [place], [place] [address]    Report Status PENDING  Incomplete  Resp Panel by RT-PCR (Flu A&B, Covid) Nasopharyngeal Swab     Status: None   Collection Time: 03/12/2020  4:03 PM   Specimen: Nasopharyngeal Swab; Nasopharyngeal(NP) swabs in vial transport medium  Result Value Ref Range Status   SARS Coronavirus 2 by RT PCR NEGATIVE NEGATIVE Final    Comment: (NOTE) SARS-CoV-2 target nucleic acids are NOT DETECTED.  The SARS-CoV-2 RNA is generally detectable in upper respiratory specimens during the acute phase of infection. The lowest concentration of SARS-CoV-2 viral copies this assay can detect is 138 copies/mL. A negative result does not preclude SARS-Cov-2 infection and should not be used as the sole basis for treatment or other patient management decisions. A negative result may occur with  improper specimen collection/handling, submission of specimen other than nasopharyngeal swab, presence of viral mutation(s) within the areas targeted by this assay, and inadequate number of viral copies(<138 copies/mL). A negative result must be combined with clinical observations, patient history, and epidemiological information. The expected result is Negative.  Fact Sheet for Patients:  EntrepreneurPulse.com.au  Fact Sheet for Healthcare Providers:  IncredibleEmployment.be  This test is no t yet approved or cleared by the [place] FDA and  has been authorized for detection and/or  diagnosis of SARS-CoV-2 by FDA under an Emergency Use Authorization (EUA). This EUA will remain  in effect (meaning this test can be used) for the duration of the COVID-19 declaration under Section 564(b)(1) of the Act, 21 U.S.C.section 360bbb-3(b)(1), unless the authorization is terminated  or revoked sooner.       Influenza A by PCR NEGATIVE NEGATIVE Final   Influenza B by PCR NEGATIVE NEGATIVE Final    Comment: (NOTE) The Xpert Xpress SARS-CoV-2/FLU/RSV plus assay is intended as an aid in the diagnosis of influenza from Nasopharyngeal swab specimens and should not be used as a sole basis for treatment. Nasal washings and aspirates are unacceptable for Xpert Xpress SARS-CoV-2/FLU/RSV testing.  Fact Sheet for Patients: EntrepreneurPulse.com.au  Fact Sheet for Healthcare Providers: IncredibleEmployment.be  This test is not yet approved or cleared by the [place] FDA and has been authorized for detection and/or diagnosis  of SARS-CoV-2 by FDA under an Emergency Use Authorization (EUA). This EUA will remain in effect (meaning this test can be used) for the duration of the COVID-19 declaration under Section 564(b)(1) of the Act, 21 U.S.C. section 360bbb-3(b)(1), unless the authorization is terminated or revoked.  Performed at [facility], [address] [place], [place] [address]   MRSA PCR Screening     Status: None   Collection Time: 03/23/2020  5:09 PM   Specimen: Nasopharyngeal  Result Value Ref Range Status   MRSA by PCR NEGATIVE NEGATIVE Final    Comment:        The GeneXpert MRSA Assay (FDA approved for NASAL specimens only), is one component of a comprehensive MRSA colonization surveillance program. It is not intended to diagnose MRSA infection nor to guide or monitor treatment for MRSA infections. Performed at [facility], [address] [place], [place] [address]           IMAGING    CT  Head Wo Contrast  Result Date: 03/23/2020 CLINICAL DATA:  Mental status change. EXAM: CT HEAD WITHOUT CONTRAST TECHNIQUE: Contiguous axial images were obtained from the base of the skull through the vertex without intravenous contrast. COMPARISON:  CT head, 02/02/2020 FINDINGS: Brain: No evidence of acute infarction, hemorrhage, hydrocephalus, extra-axial collection or mass lesion/mass effect. Generalized cerebral atrophy with ex vacuo ventricular dilation, not substantially changed from priors. Vascular: Calcific atherosclerosis. No hyperdense vessel identified. Skull: No acute fracture. Sinuses/Orbits: Visualized sinuses are clear.  Unremarkable orbits. Other: No mastoid effusions. IMPRESSION: 1. No evidence of acute intracranial abnormality. 2. Similar chronic microvascular ischemic disease and generalized cerebral volume loss. Electronically Signed   By: Margaretha Sheffield MD   On: 03/18/2020 15:12   DG Chest Port 1 View  Result Date: 03/12/2020 CLINICAL DATA:  Respiratory failure EXAM: PORTABLE CHEST 1 VIEW COMPARISON:  03/25/2020 FINDINGS: Endotracheal tube is seen 4 cm above the carina. Nasogastric tube extends into the gastric fundus. Developing bibasilar opacities, right greater than left, in keeping with atelectasis or focal infiltrate. No pneumothorax or pleural effusion. 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 The pulmonary vascularity is normal. IMPRESSION: Progressive bibasilar atelectasis or infiltrate. Stable support tubes. Electronically Signed   By: Fidela Salisbury MD   On: 03/12/2020 04:42   DG Chest Portable 1 View  Result Date: 03/19/2020 CLINICAL DATA:  Intubation. EXAM: PORTABLE CHEST 1 VIEW COMPARISON:  CT 02/02/2020.  Chest x-ray 02/02/2020. FINDINGS: Endotracheal tube and NG tube in stable position. Prior CABG. Heart size normal. Mild bibasilar atelectasis. Mild bibasilar infiltrates cannot be excluded. No pleural effusion or pneumothorax. Old  left rib fractures noted. IMPRESSION: 1. Endotracheal tube and NG tube in stable position. 2. Mild bibasilar atelectasis. Mild bibasilar infiltrates cannot be excluded. Electronically Signed   By: Marcello Moores  Register   On: 03/26/2020 13:06     Nutrition Status:           Indwelling Urinary Catheter continued, requirement due to   Reason to continue Indwelling Urinary Catheter strict Intake/Output monitoring for hemodynamic instability   Central Line/ continued, requirement due to  Reason to continue [place] of central venous pressure or other hemodynamic parameters and poor IV access   Ventilator continued, requirement due to severe respiratory failure   Ventilator Sedation RASS 0 to -2      ASSESSMENT AND PLAN SYNOPSIS   80 yo white female with acute and severe hypoxic respiratory failure with severe toxic metabolic encephalopathy  from  severe septic shock present on admission with severe acidosis due to UTI and aspiration pneumonia associated with severe hypercalcemia and severe acute renal failure with h/o multiple myeloma  Severe ACUTE Hypoxic and Hypercapnic Respiratory Failure -continue Full MV support -continue Bronchodilator Therapy -Wean Fio2 and PEEP as tolerated -VAP/VENT bundle implementation  ACUTE KIDNEY INJURY/Renal Failure with severe hypercalcemia -continue Foley Catheter-assess need -Avoid nephrotoxic agents -Follow urine output, BMP -Ensure adequate renal perfusion, optimize oxygenation -Renal dose medications    NEUROLOGY Acute toxic metabolic encephalopathy Off sedation Goal RASS -2 to -3   SHOCK-SEPSIS -use vasopressors to keep MAP>65 -follow ABG and LA -follow up cultures -emperic ABX -consider stress dose steroids -aggressive IV fluid resuscitation  CARDIAC ICU monitoring  ID  -continue IV abx as prescibed -follow up cultures  GI GI PROPHYLAXIS as indicated  DIET-->TF's as tolerated Constipation protocol as  indicated  ENDO - will use ICU hypoglycemic\Hyperglycemia protocol if indicated     ELECTROLYTES -follow labs as needed -replace as needed -pharmacy consultation and following   DVT/GI PRX ordered and assessed TRANSFUSIONS AS NEEDED MONITOR FSBS I Assessed the need for Labs I Assessed the need for Foley I Assessed the need for Central Venous Line Family Discussion when available I Assessed the need for Mobilization I made an Assessment of medications to be adjusted accordingly Safety Risk assessment completed   CASE DISCUSSED IN MULTIDISCIPLINARY ROUNDS WITH ICU TEAM  Critical Care Time devoted to patient care services described in this note is 57 minutes.   Overall, patient is critically ill, prognosis is guarded.  Patient with Multiorgan failure and at high risk for cardiac arrest and death.   Patient is DNR status, very poor chance of meaningful recovery palliative are team consulted  Jamen Loiseau Patricia Pesa, M.D.  [facility]  Medical Director [place] Director [facility] Cardio-Pulmonary Department

## 2020-03-12 NOTE — Progress Notes (Signed)
Patient with severe diarrhea upon admission to ICU Due to in and out of facilities, will check for c diff

## 2020-03-12 NOTE — Progress Notes (Signed)
GOALS OF CARE DISCUSSION  The Clinical status was relayed to family in detail. Son IT sales professional  Updated and notified of patients medical condition.  Patient remains unresponsive and will not open eyes to command.    Patient is having a weak cough and struggling to remove secretions.   Patient with increased WOB and using accessory muscles to breathe Explained to family course of therapy and the modalities     Patient with Progressive multiorgan failure with a very high probablity of a very minimal chance of meaningful recovery despite all aggressive and optimal medical therapy. Patient is in the Dying  Process associated with Suffering.  Family understands the situation.  They have consented and agreed to DNR status Continue medical management Son is waiting to hear from [place] are satisfied with Plan of action and management. All questions answered  Additional CC time 32 mins   Kurian Patricia Pesa, M.D.  [facility]  Medical Director [place] Director [facility] Cardio-Pulmonary Department

## 2020-03-12 NOTE — Consult Note (Signed)
[place]  [phone] [phone]   Name: [name] Date: 03/12/2020 MRN: [id number]  DOB: 1940/02/14  Patient Care Team: Lynnell Jude, MD as PCP - General (Family Medicine)    REASON FOR CONSULTATION: [name] is a 80 y.o. 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  Patient was hospitalized at [facility] 02/10/2020-02/13/2020 with altered mental status secondary to hypercalcemia, which ultimately resolved with fluid resuscitation.  Patient was again hospitalized at [facility] 02/14/2020-03/04/2020 with altered mental status and recurrent hypercalcemia thought secondary to multiple myeloma and dehydration.  She is now readmitted 03/19/2020 with hypoxic respiratory failure requiring intubation.  Patient was again found to be hypercalcemic and remains obtunded on the ventilator.  She is being treated for sepsis requiring pressors.  Palliative care was consulted to help address goals.  SOCIAL HISTORY:     reports that she has never smoked. She has never used smokeless tobacco. She reports previous alcohol use. She reports that she does not use drugs.  Patient is widowed.  She was living at home alone prior to her recent hospitalizations.  She has since either been in the hospital or SNF.  Patient has a son who is involved in her care.  ADVANCE DIRECTIVES:  None on file  CODE STATUS: DNR  PAST MEDICAL HISTORY: Past Medical History:  Diagnosis Date  . Anginal pain ([place])   . Anxiety   . Arthritis    "shoulders primarily" (01/13/2016)  . CAD (coronary artery disease)    s/p CABG in Oct 2017  . CHF (congestive heart failure) ([place])   . Chronic pain    from her multiple myeloma but says that her pain is usually in her legs/notes 01/13/2016  . High cholesterol   . History of blood transfusion    "numerous; related to  procedures for my heart" (01/13/2016)  . Hypertension   . Ischemic cardiomyopathy    /nots 01/13/2016  . MI (myocardial infarction) ([place])    EKG on arrival 01/13/2016 showed NSR with evidence of old anterior and inferior infarcts  . MI (myocardial infarction) ([place]) 08/2015  . Multiple myeloma ([place]) dx'd 2015   off chemo since 2016  . Nausea and vomiting 05/10/2016   3-4 month hx of nausea and vomiting  . PAD (peripheral artery disease) ([place])   . Stroke [facility])    "several in 1 year; ? year" (01/13/2016)  . Type II diabetes mellitus ([place])     PAST SURGICAL HISTORY:  Past Surgical History:  Procedure Laterality Date  . CARDIAC CATHETERIZATION N/A 01/14/2016   Procedure: Left Heart Cath and Cors/Grafts Angiography;  Surgeon: Burnell Blanks, MD;  Location: [facility];  Service: Cardiovascular;  Laterality: N/A;  . CARPAL TUNNEL RELEASE Left   . CORONARY ANGIOPLASTY WITH STENT PLACEMENT  05/2015   DES placed to the mid and distal LAD as well as OM2/notes 01/13/2016  . CORONARY ANGIOPLASTY WITH STENT PLACEMENT  ~ 2010  . CORONARY ARTERY BYPASS GRAFT  11/11/2015   CABG X2 LIMA-LAD and SVG-OM/notes 01/13/2016  . DILATION AND CURETTAGE OF UTERUS    . FEMORAL-POPLITEAL BYPASS GRAFT Left 2013   Archie Endo 01/13/2016  . LAPAROSCOPIC GASTRIC BANDING  2007  . RENAL ARTERY STENT    . VAGINAL HYSTERECTOMY      HEMATOLOGY/ONCOLOGY HISTORY:  Oncology History   No history exists.  ALLERGIES:  is allergic to benzodiazepines, iodinated diagnostic agents, other, statins, ticagrelor, metoclopramide, amoxicillin, codeine, duloxetine, and sulfa antibiotics.  MEDICATIONS:  Current Facility-Administered Medications  Medication Dose Route Frequency Provider Last Rate Last Admin  . 0.45 % sodium chloride infusion   Intravenous Continuous Flora Lipps, MD 50 mL/hr at 03/12/20 1158 New Bag at 03/12/20 1158  . 0.9 %  sodium chloride infusion  250 mL Intravenous PRN Flora Lipps, MD      . 0.9 %  sodium  chloride infusion  250 mL Intravenous Continuous Dorothe Pea, [place]   Held at 03/17/2020 1712  . acetaminophen (TYLENOL) tablet 650 mg  650 mg Oral Q4H PRN Flora Lipps, MD      . amiodarone (NEXTERONE) IV bolus only 150 mg/100 mL  150 mg Intravenous Once Flora Lipps, MD      . calcitonin (MIACALCIN) injection 248 Units  4 Units/kg Intramuscular BID Flora Lipps, MD   248 Units at 03/12/20 1200  . chlorhexidine gluconate (MEDLINE KIT) (PERIDEX) 0.12 % solution 15 mL  15 mL Mouth Rinse BID Awilda Bill, NP   15 mL at 03/12/20 0844  . Chlorhexidine Gluconate Cloth 2 % PADS 6 each  6 each Topical Daily Flora Lipps, MD   6 each at 04/06/2020 1713  . docusate sodium (COLACE) capsule 100 mg  100 mg Oral BID PRN Flora Lipps, MD      . enoxaparin (LOVENOX) injection 30 mg  30 mg Subcutaneous Q24H Mortimer Fries, Kurian, MD   30 mg at 04/09/2020 2119  . famotidine (PEPCID) IVPB 20 mg premix  20 mg Intravenous Q24H Flora Lipps, MD 100 mL/hr at 03/12/20 0952 20 mg at 03/12/20 0952  . influenza vaccine adjuvanted (FLUAD) injection 0.5 mL  0.5 mL Intramuscular Tomorrow-1000 Kasa, Kurian, MD      . insulin aspart (novoLOG) injection 0-15 Units  0-15 Units Subcutaneous Q4H Flora Lipps, MD   3 Units at 03/12/20 0012  . MEDLINE mouth rinse  15 mL Mouth Rinse 10 times per day Awilda Bill, NP   15 mL at 03/12/20 1202  . norepinephrine (LEVOPHED) 68m in 2573mpremix infusion  2-15 mcg/min Intravenous Titrated BlAwilda BillNP 37.5 mL/hr at 03/12/20 0635 10 mcg/min at 03/12/20 0635  . ondansetron (ZOFRAN) injection 4 mg  4 mg Intravenous Q6H PRN KaFlora LippsMD      . pantoprazole (PROTONIX) injection 40 mg  40 mg Intravenous Q24H KaFlora LippsMD   40 mg at 03/22/2020 1738  . pneumococcal 23 valent vaccine (PNEUMOVAX-23) injection 0.5 mL  0.5 mL Intramuscular Tomorrow-1000 Kasa, Kurian, MD      . polyethylene glycol (MIRALAX / GLYCOLAX) packet 17 g  17 g Oral Daily PRN KaFlora LippsMD      . sodium chloride flush  (NS) 0.9 % injection 3 mL  3 mL Intravenous Q12H KaFlora LippsMD   3 mL at 03/12/20 0952  . sodium chloride flush (NS) 0.9 % injection 3 mL  3 mL Intravenous PRN KaFlora LippsMD        VITAL SIGNS: BP 116/66   Pulse (!) 125   Temp (!) 101.3 F (38.5 C)   Resp (!) 29   Ht 5' 2" (1.575 m)   Wt 137 lb 2 oz (62.2 kg)   SpO2 97%   BMI 25.08 kg/m  Filed Weights   04/07/2020 1219 03/20/2020 1715 03/12/20 0157  Weight: 149 lb 7.6 oz (67.8 kg) 136 lb 3.9 oz (61.8 kg) 137  lb 2 oz (62.2 kg)    Estimated body mass index is 25.08 kg/m as calculated from the following:   Height as of this encounter: 5' 2" (1.575 m).   Weight as of this encounter: 137 lb 2 oz (62.2 kg).  LABS: CBC:    Component Value Date/Time   WBC 2.0 (L) 03/12/2020 0441   HGB 11.1 (L) 03/12/2020 0441   HGB 10.9 (L) 06/03/2016 1102   HCT 34.6 (L) 03/12/2020 0441   HCT 32.8 (L) 10/06/2016 1608   PLT PLATELET CLUMPS NOTED ON SMEAR, UNABLE TO ESTIMATE 03/12/2020 0441   PLT 256 06/03/2016 1102   MCV 92.5 03/12/2020 0441   MCV 83 06/03/2016 1102   NEUTROABS 0.7 (L) 03/25/2020 1342   NEUTROABS 3.1 06/03/2016 1102   LYMPHSABS 4.3 (H) 04/06/2020 1342   LYMPHSABS 6.5 (H) 06/03/2016 1102   MONOABS 0.6 03/25/2020 1342   EOSABS 0.0 03/27/2020 1342   EOSABS 0.2 06/03/2016 1102   BASOSABS 0.0 03/30/2020 1342   BASOSABS 0.0 06/03/2016 1102   Comprehensive Metabolic Panel:    Component Value Date/Time   NA 148 (H) 03/12/2020 0441   NA 138 06/03/2016 1102   K 3.4 (L) 03/12/2020 0441   CL 117 (H) 03/12/2020 0441   CO2 18 (L) 03/12/2020 0441   BUN 53 (H) 03/12/2020 0441   BUN 18 06/03/2016 1102   CREATININE 2.46 (H) 03/12/2020 0441   GLUCOSE 120 (H) 03/12/2020 0441   CALCIUM 14.1 (HH) 03/12/2020 0441   AST 156 (H) 03/31/2020 1342   ALT 64 (H) 03/26/2020 1342   ALKPHOS 61 04/02/2020 1342   BILITOT 1.2 04/01/2020 1342   BILITOT <0.2 06/03/2016 1102   PROT 7.1 03/21/2020 1342   PROT 6.2 06/03/2016 1102   ALBUMIN 2.9 (L)  04/10/2020 1342   ALBUMIN 4.3 06/03/2016 1102    RADIOGRAPHIC STUDIES: CT Head Wo Contrast  Result Date: 03/25/2020 CLINICAL DATA:  Mental status change. EXAM: CT HEAD WITHOUT CONTRAST TECHNIQUE: Contiguous axial images were obtained from the base of the skull through the vertex without intravenous contrast. COMPARISON:  CT head, 02/02/2020 FINDINGS: Brain: No evidence of acute infarction, hemorrhage, hydrocephalus, extra-axial collection or mass lesion/mass effect. Generalized cerebral atrophy with ex vacuo ventricular dilation, not substantially changed from priors. Vascular: Calcific atherosclerosis. No hyperdense vessel identified. Skull: No acute fracture. Sinuses/Orbits: Visualized sinuses are clear.  Unremarkable orbits. Other: No mastoid effusions. IMPRESSION: 1. No evidence of acute intracranial abnormality. 2. Similar chronic microvascular ischemic disease and generalized cerebral volume loss. Electronically Signed   By: Margaretha Sheffield MD   On: 04/07/2020 15:12   DG Chest Port 1 View  Result Date: 03/12/2020 CLINICAL DATA:  Respiratory failure EXAM: PORTABLE CHEST 1 VIEW COMPARISON:  03/27/2020 FINDINGS: Endotracheal tube is seen 4 cm above the carina. Nasogastric tube extends into the gastric fundus. Developing bibasilar opacities, right greater than left, in keeping with atelectasis or focal infiltrate. No pneumothorax or pleural effusion. 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 The pulmonary vascularity is normal. IMPRESSION: Progressive bibasilar atelectasis or infiltrate. Stable support tubes. Electronically Signed   By: Fidela Salisbury MD   On: 03/12/2020 04:42   DG Chest Portable 1 View  Result Date: 03/24/2020 CLINICAL DATA:  Intubation. EXAM: PORTABLE CHEST 1 VIEW COMPARISON:  CT 02/02/2020.  Chest x-ray 02/02/2020. FINDINGS: Endotracheal tube and NG tube in stable position. Prior CABG. Heart size normal. Mild bibasilar atelectasis. Mild  bibasilar infiltrates cannot be excluded. No pleural effusion or pneumothorax.  Old left rib fractures noted. IMPRESSION: 1. Endotracheal tube and NG tube in stable position. 2. Mild bibasilar atelectasis. Mild bibasilar infiltrates cannot be excluded. Electronically Signed   By: Marcello Moores  Register   On: 03/14/2020 13:06    PERFORMANCE STATUS (ECOG) : 4 - Bedbound  Review of Systems Unable to provide  Physical Exam General: Ill-appearing Cardiovascular: Tachycardic Pulmonary: clear ant fields Abdomen: soft, nontender, + bowel sounds, rectal tube GU:  Foley catheter Extremities: no edema, no joint deformities Skin: no rashes Neurological: Withdraws from noxious stimuli but is otherwise unresponsive  IMPRESSION: Patient remains in ICU on ventilator and pressors.  She withdraws slightly from noxious stimuli but is otherwise unresponsive.  Patient remains hypercalcemic.  I met with son who was at bedside.  He confirms that patient has significantly declined over the past several months.  Patient has mostly been in the hospital over the past month with a short stay at rehab prior to readmission.  Her baseline performance status had significantly declined and patient had become essentially total care.  Patient son seems to have a reasonable understanding of her current medical problems and prognosis.  He says that patient had told him that she was not interested in aggressive chemotherapy.  Son also seems to understand that patient is not felt to be a viable candidate for systemic treatment of the cancer given her overall decline and poor performance status.  He says that hemodialysis has also been mentioned as a possible requirement should he decide to escalate care.  However, son says that he thinks patient probably would not want the aggressive nature of care that she is currently receiving.  We discussed the option of comfort care in detail.  Son was quite emotional and tearful during these  conversations and I encouraged him to take time to process this information.  PLAN: -Continue current scope of treatment -Son is considering decision making  Case and plan discussed with Drs. Kasa and Corcoran   Time Total: 60 minutes  Visit consisted of counseling and education dealing with the complex and emotionally intense issues of symptom management and palliative care in the setting of serious and potentially life-threatening illness.Greater than 50%  of this time was spent counseling and coordinating care related to the above assessment and plan.  Signed by: Altha Harm, PhD, NP-C

## 2020-03-12 NOTE — Consult Note (Signed)
[facility] CONSULT NOTE    Date: 03/12/2020                  Patient Name:  [name]  MRN: [id number]  DOB: [birth year]  Age / Sex: 80 y.o., female         PCP: Lynnell Jude, MD                 Service Requesting Consult:  Pulmonary/critical care                 Reason for Consult:  Acute kidney injury/hypercalcemia/chronic kidney disease stage IIIa baseline EGFR 43            History of Present Illness: Patient is a 80 y.o. female with a PMHx of multiple myeloma, CLL, coronary disease status post CABG, diabetes mellitus type 2, chronic kidney disease stage IIIA baseline EGFR 47, who was admitted to [facility] on 03/22/2020 for evaluation of acute respiratory failure.  Patient was recently admitted to [facility] from 02/14/2020 to 03/04/2020.  She was found to have hypercalcemia at that time treated medically.  She also has underlying multiple myeloma and CLL as above.  For her prior episode of hypercalcemia patient was given pamidronate and calcitonin.  It appears that the patient has had significant recurrence of hypercalcemia.  Initial calcium was greater than 15.  Calcium now 14.1.  Case discussed with patient's son at the bedside.  It appears that they are very limited treatment options for underlying myeloma and CLL per Dr. Mike Gip of hematology.  Patient also with acute kidney injury with a BUN of 53 and creatinine of 2.46 now.   Medications: Outpatient medications: Medications Prior to Admission  Medication Sig Dispense Refill Last Dose  . acetaminophen (TYLENOL) 325 MG tablet Take 650 mg by mouth every 4 (four) hours as needed.   prn at prn  . clopidogrel (PLAVIX) 75 MG tablet Take 75 mg by mouth daily.   11 03/10/2020 at Unknown time  . conjugated estrogens (PREMARIN) vaginal cream Place 1 Applicatorful vaginally once a week.   Past Month at Unknown time  . diclofenac sodium (VOLTAREN) 1 % GEL Apply 2 g topically daily as needed (to affected, painful sites).     prn at prn  . Evolocumab (REPATHA) 140 MG/ML SOSY Inject 140 mLs into the skin every 14 (fourteen) days.   Past Month at Unknown time  . fentaNYL (DURAGESIC) 50 MCG/HR Place 1 patch onto the skin every 3 (three) days. 5 patch 0 Past Week at Unknown time  . insulin lispro (HUMALOG) 100 UNIT/ML injection Inject 0-12 Units into the skin 3 (three) times daily before meals.   03/10/2020 at Unknown time  . isosorbide mononitrate (IMDUR) 30 MG 24 hr tablet Take 1 tablet (30 mg total) by mouth daily. Please make yearly appt with Dr. Meda Coffee for September before anymore refills. 1st attempt   03/10/2020 at Unknown time  . lisinopril (ZESTRIL) 40 MG tablet Take 40 mg by mouth daily.   03/10/2020 at Unknown time  . metoprolol succinate (TOPROL-XL) 25 MG 24 hr tablet Take 25 mg by mouth daily.   03/10/2020 at Unknown time  . nitroGLYCERIN (NITROSTAT) 0.4 MG SL tablet Place 1 tablet (0.4 mg total) under the tongue every 5 (five) minutes as needed for chest pain. 25 tablet 2 prn at prn  . ondansetron (ZOFRAN ODT) 4 MG disintegrating tablet Allow 1-2 tablets to dissolve in your mouth every 8 hours as needed  for nausea/vomiting 30 tablet 0 prn at prn  . pantoprazole (PROTONIX) 40 MG tablet Take 1 tablet (40 mg total) by mouth daily at 6 (six) AM. 30 tablet 0 03/10/2020 at Unknown time  . polyethylene glycol (MIRALAX / GLYCOLAX) 17 g packet Take 17 g by mouth daily.   03/10/2020 at Unknown time  . ranolazine (RANEXA) 500 MG 12 hr tablet Take 500 mg by mouth 2 (two) times daily.   03/10/2020 at Unknown time  . senna (SENOKOT) 8.6 MG TABS tablet Take 2 tablets by mouth at bedtime.   03/10/2020 at Unknown time  . loratadine (CLARITIN) 10 MG tablet Take 1 tablet (10 mg total) by mouth daily. 30 tablet 0     Current medications: Current Facility-Administered Medications  Medication Dose Route Frequency Provider Last Rate Last Admin  . 0.45 % sodium chloride infusion   Intravenous Continuous Flora Lipps, MD 50 mL/hr at 03/12/20  1158 New Bag at 03/12/20 1158  . 0.9 %  sodium chloride infusion  250 mL Intravenous PRN Flora Lipps, MD      . 0.9 %  sodium chloride infusion  250 mL Intravenous Continuous Dorothe Pea, [place]   Held at 04/06/2020 1712  . acetaminophen (TYLENOL) tablet 650 mg  650 mg Oral Q4H PRN Flora Lipps, MD   650 mg at 03/12/20 1301  . calcitonin (MIACALCIN) injection 248 Units  4 Units/kg Intramuscular BID Flora Lipps, MD   248 Units at 03/12/20 1200  . chlorhexidine gluconate (MEDLINE KIT) (PERIDEX) 0.12 % solution 15 mL  15 mL Mouth Rinse BID Awilda Bill, NP   15 mL at 03/12/20 0844  . Chlorhexidine Gluconate Cloth 2 % PADS 6 each  6 each Topical Daily Flora Lipps, MD   6 each at 03/27/2020 1713  . docusate (COLACE) 50 MG/5ML liquid 100 mg  100 mg Per Tube BID PRN Dallie Piles, RPH      . enoxaparin (LOVENOX) injection 30 mg  30 mg Subcutaneous Q24H Mortimer Fries, Kurian, MD   30 mg at 03/22/2020 2119  . famotidine (PEPCID) IVPB 20 mg premix  20 mg Intravenous Q24H Flora Lipps, MD 100 mL/hr at 03/12/20 0952 20 mg at 03/12/20 0952  . influenza vaccine adjuvanted (FLUAD) injection 0.5 mL  0.5 mL Intramuscular Tomorrow-1000 Kasa, Kurian, MD      . insulin aspart (novoLOG) injection 0-15 Units  0-15 Units Subcutaneous Q4H Flora Lipps, MD   3 Units at 03/12/20 0012  . MEDLINE mouth rinse  15 mL Mouth Rinse 10 times per day Awilda Bill, NP   15 mL at 03/12/20 1202  . norepinephrine (LEVOPHED) 4mg  in 218mL premix infusion  2-15 mcg/min Intravenous Titrated Awilda Bill, NP 37.5 mL/hr at 03/12/20 0635 10 mcg/min at 03/12/20 0635  . ondansetron (ZOFRAN) injection 4 mg  4 mg Intravenous Q6H PRN Flora Lipps, MD      . pantoprazole (PROTONIX) injection 40 mg  40 mg Intravenous Q24H Flora Lipps, MD   40 mg at 03/14/2020 1738  . pneumococcal 23 valent vaccine (PNEUMOVAX-23) injection 0.5 mL  0.5 mL Intramuscular Tomorrow-1000 Kasa, Kurian, MD      . polyethylene glycol (MIRALAX / GLYCOLAX) packet 17 g  17 g  Oral Daily PRN Flora Lipps, MD      . sodium chloride flush (NS) 0.9 % injection 3 mL  3 mL Intravenous Q12H Flora Lipps, MD   3 mL at 03/12/20 0952  . sodium chloride flush (NS) 0.9 % injection 3  mL  3 mL Intravenous PRN Flora Lipps, MD          Allergies: Allergies  Allergen Reactions  . Benzodiazepines Anxiety and Other (See Comments)    Confusion, also  . Iodinated Diagnostic Agents Swelling    "Head swells"  . Other Swelling    Smoke--Head swelling  . Statins Swelling    "Head swells"  . Ticagrelor Shortness Of Breath  . Metoclopramide Other (See Comments)    Causes tremors and insomnia  . Amoxicillin Hives and Rash  . Codeine Hives and Rash  . Duloxetine Nausea Only  . Sulfa Antibiotics Hives and Rash      Past Medical History: Past Medical History:  Diagnosis Date  . Anginal pain ([place])   . Anxiety   . Arthritis    "shoulders primarily" (01/13/2016)  . CAD (coronary artery disease)    s/p CABG in Oct 2017  . CHF (congestive heart failure) ([place])   . Chronic pain    from her multiple myeloma but says that her pain is usually in her legs/notes 01/13/2016  . High cholesterol   . History of blood transfusion    "numerous; related to procedures for my heart" (01/13/2016)  . Hypertension   . Ischemic cardiomyopathy    /nots 01/13/2016  . MI (myocardial infarction) ([place])    EKG on arrival 01/13/2016 showed NSR with evidence of old anterior and inferior infarcts  . MI (myocardial infarction) ([place]) 08/2015  . Multiple myeloma ([place]) dx'd 2015   off chemo since 2016  . Nausea and vomiting 05/10/2016   3-4 month hx of nausea and vomiting  . PAD (peripheral artery disease) ([place])   . Stroke [facility])    "several in 1 year; ? year" (01/13/2016)  . Type II diabetes mellitus ([place])      Past Surgical History: Past Surgical History:  Procedure Laterality Date  . CARDIAC CATHETERIZATION N/A 01/14/2016   Procedure: Left Heart Cath and Cors/Grafts Angiography;  Surgeon: Burnell Blanks, MD;  Location: [facility];  Service: Cardiovascular;  Laterality: N/A;  . CARPAL TUNNEL RELEASE Left   . CORONARY ANGIOPLASTY WITH STENT PLACEMENT  05/2015   DES placed to the mid and distal LAD as well as OM2/notes 01/13/2016  . CORONARY ANGIOPLASTY WITH STENT PLACEMENT  ~ 2010  . CORONARY ARTERY BYPASS GRAFT  11/11/2015   CABG X2 LIMA-LAD and SVG-OM/notes 01/13/2016  . DILATION AND CURETTAGE OF UTERUS    . FEMORAL-POPLITEAL BYPASS GRAFT Left 2013   Archie Endo 01/13/2016  . LAPAROSCOPIC GASTRIC BANDING  2007  . RENAL ARTERY STENT    . VAGINAL HYSTERECTOMY       Family History: Family History  Problem Relation Age of Onset  . Diabetes Mother   . Heart failure Mother   . Prostate cancer Neg Hx   . Kidney cancer Neg Hx   . Bladder Cancer Neg Hx   . Colon cancer Neg Hx   . Stomach cancer Neg Hx      Social History: Social History   Socioeconomic History  . Marital status: Divorced    Spouse name: Not on file  . Number of children: 2  . Years of education: Not on file  . Highest education level: Not on file  Occupational History  . Occupation: retired  Tobacco Use  . Smoking status: Never Smoker  . Smokeless tobacco: Never Used  Vaping Use  . Vaping Use: Never used  Substance and Sexual Activity  . Alcohol use:  Not Currently    Comment: occasional wine  . Drug use: No  . Sexual activity: Not Currently    Partners: Male  Other Topics Concern  . Not on file  Social History Narrative   Independent at baseline. Ambulates with the help of a walker   Social Determinants of Health   Financial Resource Strain: Not on file  Food Insecurity: Not on file  Transportation Needs: Not on file  Physical Activity: Not on file  Stress: Not on file  Social Connections: Not on file  Intimate Partner Violence: Not on file     Review of Systems: Patient unable to provide review of systems as she is currently intubated  Vital Signs: Blood pressure 116/66, pulse  (!) 125, temperature (!) 101.3 F (38.5 C), resp. rate (!) 29, height $RemoveBe'5\' 2"'wYgEPFiQB$  (1.575 m), weight 62.2 kg, SpO2 97 %.  Weight trends: Filed Weights   03/20/2020 1219 04/04/2020 1715 03/12/20 0157  Weight: 67.8 kg 61.8 kg 62.2 kg    Physical Exam: Physical Exam: General:  Critically ill-appearing  Head:  Normocephalic, atraumatic. Moist oral mucosal membranes  Eyes:  Anicteric  Neck:  Supple  Lungs:   Vent assisted breath sounds  Heart:  S1S2 no rubs  Abdomen:   Soft, nontender, bowel sounds present  Extremities:  2+ peripheral edema.  Neurologic:  Intubated, sedated  Skin:  No acute rash  Access:  No hemodialysis access    Lab results: Basic Metabolic Panel: Recent Labs  Lab 03/31/2020 1342 04/03/2020 2139 03/12/20 0441  NA 147*  --  148*  K 2.8* 2.4* 3.4*  CL 113*  --  117*  CO2 16*  --  18*  GLUCOSE 259*  --  120*  BUN 56*  --  53*  CREATININE 3.01*  --  2.46*  CALCIUM >15.0*  --  14.1*  MG  --  1.2* 3.1*  PHOS  --  3.5 2.3*    Liver Function Tests: Recent Labs  Lab 03/18/2020 1342  AST 156*  ALT 64*  ALKPHOS 61  BILITOT 1.2  PROT 7.1  ALBUMIN 2.9*   No results for input(s): LIPASE, AMYLASE in the last 168 hours. No results for input(s): AMMONIA in the last 168 hours.  CBC: Recent Labs  Lab 04/01/2020 1342 03/12/20 0441  WBC 5.6 2.0*  NEUTROABS 0.7*  --   HGB 11.8* 11.1*  HCT 40.2 34.6*  MCV 97.6 92.5  PLT 127* PLATELET CLUMPS NOTED ON SMEAR, UNABLE TO ESTIMATE    Cardiac Enzymes: No results for input(s): CKTOTAL, CKMB, CKMBINDEX, TROPONINI in the last 168 hours.  BNP: Invalid input(s): POCBNP  CBG: Recent Labs  Lab 03/27/2020 1954 04/01/2020 2349 03/12/20 0351 03/12/20 0719 03/12/20 1059  GLUCAP 230* 157* 115* 94 104*    Microbiology: Results for orders placed or performed during the hospital encounter of 04/01/2020  Culture, blood (Routine x 2)     Status: None (Preliminary result)   Collection Time: 03/28/2020 12:02 PM   Specimen: BLOOD  Result  Value Ref Range Status   Specimen Description BLOOD RIGHT [facility]  Final   Special Requests   Final    BOTTLES DRAWN AEROBIC AND ANAEROBIC Blood Culture adequate volume   Culture   Final    NO GROWTH < 24 HOURS Performed at [facility], [address] [place], [place] [address]    Report Status PENDING  Incomplete  Culture, blood (Routine x 2)     Status: None (Preliminary result)   Collection Time:  04/01/2020  1:41 PM   Specimen: BLOOD  Result Value Ref Range Status   Specimen Description BLOOD LEFT WRIST  Final   Special Requests   Final    BOTTLES DRAWN AEROBIC AND ANAEROBIC Blood Culture results may not be optimal due to an inadequate volume of blood received in culture bottles   Culture   Final    NO GROWTH < 24 HOURS Performed at [facility], [address], [place], [place] [address]    Report Status PENDING  Incomplete  Resp Panel by RT-PCR (Flu A&B, Covid) Nasopharyngeal Swab     Status: None   Collection Time: 03/22/2020  4:03 PM   Specimen: Nasopharyngeal Swab; Nasopharyngeal(NP) swabs in vial transport medium  Result Value Ref Range Status   SARS Coronavirus 2 by RT PCR NEGATIVE NEGATIVE Final    Comment: (NOTE) SARS-CoV-2 target nucleic acids are NOT DETECTED.  The SARS-CoV-2 RNA is generally detectable in upper respiratory specimens during the acute phase of infection. The lowest concentration of SARS-CoV-2 viral copies this assay can detect is 138 copies/mL. A negative result does not preclude SARS-Cov-2 infection and should not be used as the sole basis for treatment or other patient management decisions. A negative result may occur with  improper specimen collection/handling, submission of specimen other than nasopharyngeal swab, presence of viral mutation(s) within the areas targeted by this assay, and inadequate number of viral copies(<138 copies/mL). A negative result must be combined with clinical observations, patient history, and  epidemiological information. The expected result is Negative.  Fact Sheet for Patients:  EntrepreneurPulse.com.au  Fact Sheet for Healthcare Providers:  IncredibleEmployment.be  This test is no t yet approved or cleared by the [place] FDA and  has been authorized for detection and/or diagnosis of SARS-CoV-2 by FDA under an Emergency Use Authorization (EUA). This EUA will remain  in effect (meaning this test can be used) for the duration of the COVID-19 declaration under Section 564(b)(1) of the Act, 21 U.S.C.section 360bbb-3(b)(1), unless the authorization is terminated  or revoked sooner.       Influenza A by PCR NEGATIVE NEGATIVE Final   Influenza B by PCR NEGATIVE NEGATIVE Final    Comment: (NOTE) The Xpert Xpress SARS-CoV-2/FLU/RSV plus assay is intended as an aid in the diagnosis of influenza from Nasopharyngeal swab specimens and should not be used as a sole basis for treatment. Nasal washings and aspirates are unacceptable for Xpert Xpress SARS-CoV-2/FLU/RSV testing.  Fact Sheet for Patients: EntrepreneurPulse.com.au  Fact Sheet for Healthcare Providers: IncredibleEmployment.be  This test is not yet approved or cleared by the [place] FDA and has been authorized for detection and/or diagnosis of SARS-CoV-2 by FDA under an Emergency Use Authorization (EUA). This EUA will remain in effect (meaning this test can be used) for the duration of the COVID-19 declaration under Section 564(b)(1) of the Act, 21 U.S.C. section 360bbb-3(b)(1), unless the authorization is terminated or revoked.  Performed at [facility], [address] [place], [place] [address]   MRSA PCR Screening     Status: None   Collection Time: 03/12/2020  5:09 PM   Specimen: Nasopharyngeal  Result Value Ref Range Status   MRSA by PCR NEGATIVE NEGATIVE Final    Comment:        The GeneXpert MRSA Assay  (FDA approved for NASAL specimens only), is one component of a comprehensive MRSA colonization surveillance program. It is not intended to diagnose MRSA infection nor to guide or monitor treatment for MRSA infections.  Performed at [facility], [place], [place] [address]   C Difficile Quick Screen w PCR reflex     Status: Abnormal   Collection Time: 03/12/20  8:42 AM   Specimen: STOOL  Result Value Ref Range Status   C Diff antigen POSITIVE (A) NEGATIVE Final   C Diff toxin NEGATIVE NEGATIVE Final   C Diff interpretation Results are indeterminate. See PCR results.  Final    Comment: Performed at [facility], [address] [place], [place] [address]  C. Diff by PCR, Reflexed     Status: None   Collection Time: 03/12/20  8:42 AM  Result Value Ref Range Status   Toxigenic C. Difficile by PCR NEGATIVE NEGATIVE Final    Comment: Patient is colonized with non toxigenic C. difficile. May not need treatment unless significant symptoms are present. Performed at [facility], [address] [place], [place] [address]     Coagulation Studies: Recent Labs    03/14/2020 1342  LABPROT 16.0*  INR 1.3*    Urinalysis: Recent Labs    03/13/2020 1223  COLORURINE YELLOW*  LABSPEC 1.017  PHURINE 5.0  GLUCOSEU NEGATIVE  HGBUR SMALL*  BILIRUBINUR NEGATIVE  KETONESUR NEGATIVE  PROTEINUR 100*  NITRITE NEGATIVE  LEUKOCYTESUR SMALL*      Imaging: CT Head Wo Contrast  Result Date: 03/27/2020 CLINICAL DATA:  Mental status change. EXAM: CT HEAD WITHOUT CONTRAST TECHNIQUE: Contiguous axial images were obtained from the base of the skull through the vertex without intravenous contrast. COMPARISON:  CT head, 02/02/2020 FINDINGS: Brain: No evidence of acute infarction, hemorrhage, hydrocephalus, extra-axial collection or mass lesion/mass effect. Generalized cerebral atrophy with ex vacuo ventricular dilation, not substantially changed from priors.  Vascular: Calcific atherosclerosis. No hyperdense vessel identified. Skull: No acute fracture. Sinuses/Orbits: Visualized sinuses are clear.  Unremarkable orbits. Other: No mastoid effusions. IMPRESSION: 1. No evidence of acute intracranial abnormality. 2. Similar chronic microvascular ischemic disease and generalized cerebral volume loss. Electronically Signed   By: Margaretha Sheffield MD   On: 03/22/2020 15:12   DG Chest Port 1 View  Result Date: 03/12/2020 CLINICAL DATA:  Respiratory failure EXAM: PORTABLE CHEST 1 VIEW COMPARISON:  03/31/2020 FINDINGS: Endotracheal tube is seen 4 cm above the carina. Nasogastric tube extends into the gastric fundus. Developing bibasilar opacities, right greater than left, in keeping with atelectasis or focal infiltrate. No pneumothorax or pleural effusion. 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 The pulmonary vascularity is normal. IMPRESSION: Progressive bibasilar atelectasis or infiltrate. Stable support tubes. Electronically Signed   By: Fidela Salisbury MD   On: 03/12/2020 04:42   DG Chest Portable 1 View  Result Date: 03/14/2020 CLINICAL DATA:  Intubation. EXAM: PORTABLE CHEST 1 VIEW COMPARISON:  CT 02/02/2020.  Chest x-ray 02/02/2020. FINDINGS: Endotracheal tube and NG tube in stable position. Prior CABG. Heart size normal. Mild bibasilar atelectasis. Mild bibasilar infiltrates cannot be excluded. No pleural effusion or pneumothorax. Old left rib fractures noted. IMPRESSION: 1. Endotracheal tube and NG tube in stable position. 2. Mild bibasilar atelectasis. Mild bibasilar infiltrates cannot be excluded. Electronically Signed   By: Marcello Moores  Register   On: 04/06/2020 13:06      Assessment & Plan: Pt is a 80 y.o. female with a PMHx of multiple myeloma, CLL, coronary disease status post CABG, diabetes mellitus type 2, chronic kidney disease stage IIIA baseline EGFR 47, who was admitted to [facility] on 03/31/2020 for evaluation of acute  respiratory failure.  1.  Acute kidney injury most likely due to ATN hypercalcemia/chronic kidney disease stage IIIa baseline EGFR 47/hypercalcemia.  Suspect that acute kidney injury related to hypercalcemia, subsequent volume depletion which has progressed to ATN.  Because of hypercalcemia likely uncontrolled multiple myeloma.  There appeared to be limited treatment options given her current functional status, frailty, and critically ill status.  Palliative care has been consulted.  Dialysis likely to be only a temporizing measure as hypercalcemia likely to recur once dialysis were to be stopped.  We discussed this with the patient son.  Hold off on dialysis intervention at the moment.  Appreciate palliative care input.  2.  Acute respiratory failure.  Patient likely to be difficult to wean from the ventilator.  Management per pulmonary/critical care.

## 2020-03-12 NOTE — Progress Notes (Signed)
PHARMACY CONSULT NOTE - FOLLOW UP  Pharmacy Consult for Electrolyte Monitoring and Replacement   Recent Labs: Potassium (mmol/L)  Date Value  03/12/2020 3.4 (L)   Magnesium (mg/dL)  Date Value  03/12/2020 3.1 (H)   Calcium (mg/dL)  Date Value  03/12/2020 14.1 (HH)   Albumin (g/dL)  Date Value  03/26/2020 2.9 (L)  06/03/2016 4.3   Phosphorus (mg/dL)  Date Value  03/12/2020 2.3 (L)   Sodium (mmol/L)  Date Value  03/12/2020 148 (H)  06/03/2016 138   Assessment: 80 year old female admitted with AMS. Patient with significant hypercalcemia and hypokalemia. Patient with h/o multiple myeloma. Pharmacy to manage electrolytes.  Goal of Therapy:  Electrolytes WNL  Plan:  Potassium phosphate 25mols ordered by NP and infusing currently. Recheck potassium today at 1200.   HEna Dawley,PharmD Clinical Pharmacist 03/12/2020 6:11 AM

## 2020-03-12 NOTE — Plan of Care (Signed)
°  Problem: Clinical Measurements: Goal: Will remain free from infection Outcome: Progressing Goal: Respiratory complications will improve Outcome: Progressing Goal: Cardiovascular complication will be avoided Outcome: Progressing   Problem: Activity: Goal: Risk for activity intolerance will decrease Outcome: Progressing   Problem: Elimination: Goal: Will not experience complications related to bowel motility Outcome: Progressing   Problem: Pain Managment: Goal: General experience of comfort will improve Outcome: Progressing   Problem: Safety: Goal: Ability to remain free from injury will improve Outcome: Progressing   Problem: Skin Integrity: Goal: Risk for impaired skin integrity will decrease Outcome: Progressing   Problem: Education: Goal: Knowledge of General Education information will improve Description: Including pain rating scale, medication(s)/side effects and non-pharmacologic comfort measures Outcome: Not Progressing   Problem: Clinical Measurements: Goal: Ability to maintain clinical measurements within normal limits will improve Outcome: Not Progressing Goal: Diagnostic test results will improve Outcome: Not Progressing   Problem: Nutrition: Goal: Adequate nutrition will be maintained Outcome: Not Progressing

## 2020-03-13 ENCOUNTER — Inpatient Hospital Stay (HOSPITAL_COMMUNITY)
Admit: 2020-03-13 | Discharge: 2020-03-13 | Disposition: A | Discharge status: Home / Self Care | Payer: Medicare Other | Attending: Pulmonary Disease | Admitting: Pulmonary Disease

## 2020-03-13 ENCOUNTER — Inpatient Hospital Stay: Admit: 2020-03-13 | Payer: Medicare Other

## 2020-03-13 DIAGNOSIS — C9002 Multiple myeloma in relapse: Secondary | ICD-10-CM | POA: Diagnosis not present

## 2020-03-13 DIAGNOSIS — R402432 Glasgow coma scale score 3-8, at arrival to emergency department: Secondary | ICD-10-CM

## 2020-03-13 DIAGNOSIS — R0603 Acute respiratory distress: Secondary | ICD-10-CM

## 2020-03-13 DIAGNOSIS — R40243 Glasgow coma scale score 3-8, unspecified time: Secondary | ICD-10-CM

## 2020-03-13 DIAGNOSIS — J9601 Acute respiratory failure with hypoxia: Secondary | ICD-10-CM | POA: Diagnosis not present

## 2020-03-13 DIAGNOSIS — Z7189 Other specified counseling: Secondary | ICD-10-CM

## 2020-03-13 LAB — BASIC METABOLIC PANEL
Anion gap: 12 (ref 5–15)
BUN: 62 mg/dL — ABNORMAL HIGH (ref 8–23)
CO2: 17 mmol/L — ABNORMAL LOW (ref 22–32)
Calcium: 11.8 mg/dL — ABNORMAL HIGH (ref 8.9–10.3)
Chloride: 111 mmol/L (ref 98–111)
Creatinine, Ser: 2.41 mg/dL — ABNORMAL HIGH (ref 0.44–1.00)
GFR, Estimated: 20 mL/min — ABNORMAL LOW (ref >60)
Glucose, Bld: 139 mg/dL — ABNORMAL HIGH (ref 70–99)
Potassium: 4.1 mmol/L (ref 3.5–5.1)
Sodium: 140 mmol/L (ref 135–145)

## 2020-03-13 LAB — ECHOCARDIOGRAM COMPLETE
AR max vel: 1.61 cm2
AV Area VTI: 1.92 cm2
AV Area mean vel: 1.73 cm2
AV Mean grad: 2 mmHg
AV Peak grad: 3.9 mmHg
Ao pk vel: 0.99 m/s
Area-P 1/2: 6.83 cm2
Height: 62.008 in
MV VTI: 1.29 cm2
S' Lateral: 3.2 cm
Weight: 2370.39 oz

## 2020-03-13 LAB — GLUCOSE, CAPILLARY
Glucose-Capillary: 107 mg/dL — ABNORMAL HIGH (ref 70–99)
Glucose-Capillary: 113 mg/dL — ABNORMAL HIGH (ref 70–99)
Glucose-Capillary: 136 mg/dL — ABNORMAL HIGH (ref 70–99)
Glucose-Capillary: 155 mg/dL — ABNORMAL HIGH (ref 70–99)
Glucose-Capillary: 186 mg/dL — ABNORMAL HIGH (ref 70–99)
Glucose-Capillary: 84 mg/dL (ref 70–99)
Glucose-Capillary: 88 mg/dL (ref 70–99)

## 2020-03-13 LAB — CBC
HCT: 25.7 % — ABNORMAL LOW (ref 36.0–46.0)
Hemoglobin: 8.4 g/dL — ABNORMAL LOW (ref 12.0–15.0)
MCH: 29 pg (ref 26.0–34.0)
MCHC: 32.7 g/dL (ref 30.0–36.0)
MCV: 88.6 fL (ref 80.0–100.0)
Platelets: 70 10*3/uL — ABNORMAL LOW (ref 150–400)
RBC: 2.9 MIL/uL — ABNORMAL LOW (ref 3.87–5.11)
RDW: 19.1 % — ABNORMAL HIGH (ref 11.5–15.5)
WBC: 3.2 10*3/uL — ABNORMAL LOW (ref 4.0–10.5)
nRBC: 0.6 % — ABNORMAL HIGH (ref 0.0–0.2)

## 2020-03-13 LAB — MAGNESIUM: Magnesium: 2.3 mg/dL (ref 1.7–2.4)

## 2020-03-13 LAB — PHOSPHORUS: Phosphorus: 3.8 mg/dL (ref 2.5–4.6)

## 2020-03-13 LAB — VANCOMYCIN, RANDOM: Vancomycin Rm: 12

## 2020-03-13 MED ORDER — VITAL AF 1.2 CAL PO LIQD
1000.0000 mL | ORAL | Status: DC
  Administered 2020-03-13: 1000 mL

## 2020-03-13 MED ORDER — VITAL HIGH PROTEIN PO LIQD: 1000.0000 mL | ORAL | Status: DC

## 2020-03-13 MED ORDER — SODIUM CHLORIDE 0.9 % IV SOLN
2.0000 g | INTRAVENOUS | Status: DC
  Administered 2020-03-13 – 2020-03-14 (×2): 2 g via INTRAVENOUS
  Filled 2020-03-13 (×3): qty 2

## 2020-03-13 MED ORDER — VANCOMYCIN HCL 750 MG/150ML IV SOLN
750.0000 mg | INTRAVENOUS | Status: DC
  Administered 2020-03-13: 750 mg via INTRAVENOUS
  Filled 2020-03-13: qty 150

## 2020-03-13 MED ORDER — SODIUM CHLORIDE 0.9% FLUSH
10.0000 mL | Freq: Two times a day (BID) | INTRAVENOUS | Status: DC
  Administered 2020-03-13 – 2020-03-14 (×4): 10 mL

## 2020-03-13 MED ORDER — METRONIDAZOLE IN NACL 5-0.79 MG/ML-% IV SOLN
500.0000 mg | Freq: Three times a day (TID) | INTRAVENOUS | Status: DC
  Administered 2020-03-13 – 2020-03-14 (×4): 500 mg via INTRAVENOUS
  Filled 2020-03-13 (×7): qty 100

## 2020-03-13 MED ORDER — SODIUM CHLORIDE 0.9% FLUSH: 10.0000 mL | INTRAVENOUS | Status: DC | PRN

## 2020-03-13 NOTE — Progress Notes (Addendum)
Daily Progress Note   Patient Name: [name]       Date: 03/13/2020 DOB: 1940-05-08  Age: 80 y.o. MRN#: [id number] Attending Physician: Flora Lipps, MD Primary Care Physician: Lynnell Jude, MD Admit Date: 04/01/2020  Reason for Consultation/Follow-up: Establishing goals of care  Subjective: Labs and diagnostics reviewed. Patient is in bed on ventilator with no sedation required.  Son is at bedside.  Son discusses his confusion as he states he has been speaking with multiple providers including multiple oncologists with differing opinions on the origin of her hypercalcemia, and cause for her current status.  He states that there have been multiple opinions on oncologic intervention moving forward.  He is not able to advise if the recommendations are palliative or curative in nature as he states that his mother would want chemotherapy if her quality of life could still be acceptable during the process. He states he would not be interested if the treatments would only provide her weeks or months more time.  He states prior to this hospitalization she has been more weak and frail. We discussed her hospitalizations.  He states prior to 60 days ago she was fully functional and independent.   Ultimately, he realizes there are more pressing issues such as ventilator support, blood pressure support, infection, AKI, and electrolyte imbalance.  He states that she would never want a tracheostomy and she would never want dialysis.  He states that after speaking with patient's primary oncologist, there is concern that infection is driving her current status, and he would like to give it a couple of days to see if her status improves with treatment of infection, and if not he would then consider comfort  focused care.  Length of Stay: 2  Current Medications: Scheduled Meds:  . calcitonin  4 Units/kg Intramuscular BID  . chlorhexidine gluconate (MEDLINE KIT)  15 mL Mouth Rinse BID  . Chlorhexidine Gluconate Cloth  6 each Topical Daily  . enoxaparin (LOVENOX) injection  30 mg Subcutaneous Q24H  . influenza vaccine adjuvanted  0.5 mL Intramuscular Tomorrow-1000  . insulin aspart  0-15 Units Subcutaneous Q4H  . mouth rinse  15 mL Mouth Rinse 10 times per day  . pantoprazole (PROTONIX) IV  40 mg Intravenous Q24H  . pneumococcal 23 valent vaccine  0.5 mL Intramuscular Tomorrow-1000  .  sodium chloride flush  10-40 mL Intracatheter Q12H  . sodium chloride flush  3 mL Intravenous Q12H    Continuous Infusions: . sodium chloride 50 mL/hr at 03/13/20 0931  . sodium chloride    . sodium chloride Stopped (04/02/2020 1712)  . ceFEPime (MAXIPIME) IV    . famotidine (PEPCID) IV 20 mg (03/13/20 0916)  . feeding supplement (VITAL AF 1.2 CAL)    . metronidazole    . norepinephrine (LEVOPHED) Adult infusion 8 mcg/min (03/13/20 0636)    PRN Meds: sodium chloride, acetaminophen, docusate, ondansetron (ZOFRAN) IV, polyethylene glycol, sodium chloride flush, sodium chloride flush  Physical Exam Constitutional:      Comments: On ventilator.              Vital Signs: BP (!) 136/40   Pulse (!) 112   Temp 100.04 F (37.8 C)   Resp (!) 26   Ht 5' 2.01" (1.575 m)   Wt 67.2 kg   SpO2 100%   BMI 27.09 kg/m  SpO2: SpO2: 100 % O2 Device: O2 Device: Ventilator O2 Flow Rate:    Intake/output summary:   Intake/Output Summary (Last 24 hours) at 03/13/2020 1250 Last data filed at 03/13/2020 0353 Gross per 24 hour  Intake 2650.9 ml  Output 1390 ml  Net 1260.9 ml   LBM: Last BM Date: 03/13/20 Baseline Weight: Weight: 67.8 kg Most recent weight: Weight: 67.2 kg    Patient Active Problem List   Diagnosis Date Noted  . Palliative care encounter   . Respiratory failure ([place]) 03/26/2020  .  Hyponatremia 08/27/2018  . Iron deficiency anemia 08/27/2018  . Nausea 08/27/2018  . Gastroenteritis due to COVID-19 virus 08/27/2018  . Severe nausea 10/05/2016  . Nausea & vomiting 10/04/2016  . Fistula 09/30/2016  . Intractable nausea and vomiting 09/18/2016  . Lower extremity edema 05/10/2016  . Ischemic cardiomyopathy 02/23/2016  . Chills 02/23/2016  . Weight loss 02/23/2016  . Unstable angina pectoris ([place]) 01/13/2016  . Chest pain   . PVD (peripheral vascular disease) ([place])   . Coronary artery disease involving coronary bypass graft of native heart with angina pectoris ([place])   . 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 Acute blood loss anemia 11/13/2015  . Leukocytosis 11/13/2015  . Chronic pain 11/12/2015  . Acute postoperative pain 11/12/2015  . 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 Acute UTI 11/05/2015  . Edema of left lower extremity 06/16/2015  . Edema of upper extremity 06/16/2015  . Knee pain, left 06/14/2015  . AKI (acute kidney injury) ([place]) 06/11/2015  . Hyperkalemia 06/11/2015  . Hypomagnesemia 06/11/2015  . Acute leg pain, left 05/20/2015  . Multiple bruises 05/20/2015  . Recurrent urinary tract infection 03/26/2014  . Recurrent UTI 03/26/2014  . H/O gastric bypass 11/07/2013  . Peripheral neuropathy 10/10/2013  . Encounter for antineoplastic chemotherapy 10/10/2013  . Multiple myeloma ([place]) 07/16/2013  . History of critical ischemia of lower extremity 05/15/2013  . Critical lower limb ischemia ([place]) 05/15/2013  . Intermittent claudication ([place]) 07/27/2011  . Chronic coronary artery disease 04/26/2011  . Gastroesophageal reflux disease 04/26/2011  . Type 2 diabetes mellitus ([place]) 03/30/2011  . Hyperlipidemia, unspecified 03/30/2011  . Hypertension 03/30/2011    Palliative Care Assessment & Plan   Recommendations/Plan:  Continue current care at this time.  It would like to see if treatment of infection improves her status.   Code  Status:    Code Status Orders  (From admission, onward)  Start     Ordered   03/13/2020 1757  Do not attempt resuscitation (DNR)  Continuous       Question Answer Comment  In the event of cardiac or respiratory ARREST Do not call a "code blue"   In the event of cardiac or respiratory ARREST Do not perform Intubation, CPR, defibrillation or ACLS   In the event of cardiac or respiratory ARREST Use medication by any route, position, wound care, and other measures to relive pain and suffering. May use oxygen, suction and manual treatment of airway obstruction as needed for comfort.      03/21/2020 1756        Code Status History    Date Active Date Inactive Code Status Order ID Comments User Context   03/29/2020 1525 03/14/2020 1756 Full Code [id number]  Flora Lipps, MD ED   08/27/2018 0758 09/08/2018 1926 Full Code [id number]  Charlynne Cousins, MD Inpatient   10/04/2016 1952 10/06/2016 2220 Full Code [id number]  Geradine Girt, DO ED   09/18/2016 1535 09/21/2016 1727 Full Code [id number]  Gladstone Lighter, MD Inpatient   01/13/2016 1416 01/15/2016 1915 Full Code [id number]  Arbutus Leas, NP Inpatient   Advance Care Planning Activity    Advance Directive Documentation   Flowsheet Row Most Recent Value  Type of Advance Directive Healthcare Power of Attorney  Pre-existing out of facility DNR order (yellow form or pink MOST form) --  "MOST" Form in Place? --       Prognosis:  Poor    Care plan was discussed with RN  Thank you for allowing the Palliative Medicine Team to assist in the care of this patient.   Total Time 25 min Prolonged Time Billed no      Greater than 50%  of this time was spent counseling and coordinating care related to the above assessment and plan.  Asencion Gowda, NP  Please contact Palliative Medicine Team phone at [phone] for questions and concerns.

## 2020-03-13 NOTE — Progress Notes (Signed)
Peripherally Inserted Central Catheter Placement  The IV Nurse has discussed with the patient and/or persons authorized to consent for the patient, the purpose of this procedure and the potential benefits and risks involved with this procedure.  The benefits include less needle sticks, lab draws from the catheter, and the patient may be discharged home with the catheter. Risks include, but not limited to, infection, bleeding, blood clot (thrombus formation), and puncture of an artery; nerve damage and irregular heartbeat and possibility to perform a PICC exchange if needed/ordered by physician.  Alternatives to this procedure were also discussed.  Bard Power PICC patient education guide, fact sheet on infection prevention and patient information card has been provided to patient /or left at bedside. Dtr and son at bedside, son signed consent for procedure.   PICC Placement Documentation  PICC Double Lumen 03/13/20 PICC Right Brachial 37 cm 0 cm (Active)  Indication for Insertion or Continuance of Line Limited venous access - need for IV therapy >5 days (PICC only);Chronic illness with exacerbations (CF, Sickle Cell, etc.);Prolonged intravenous therapies;Vasoactive infusions 03/13/20 1114  Exposed Catheter (cm) 0 cm 03/13/20 1114  Site Assessment Clean;Dry;Intact 03/13/20 1114  Lumen #1 Status Flushed;Saline locked;Blood return noted 03/13/20 1114  Lumen #2 Status Flushed;Saline locked;Blood return noted 03/13/20 1114  Dressing Type Transparent 03/13/20 1114  Dressing Status Clean;Dry;Intact 03/13/20 1114  Antimicrobial disc in place? Yes 03/13/20 1114  Safety Lock Not Applicable 29/92/42 6834  Line Care Connections checked and tightened 03/13/20 1114  Line Adjustment (NICU/IV Team Only) No 03/13/20 1114  Dressing Intervention New dressing 03/13/20 1114  Dressing Change Due 03/20/20 03/13/20 1114       Rolena Infante 03/13/2020, 11:15 AM

## 2020-03-13 NOTE — Progress Notes (Addendum)
CRITICAL CARE NOTE  80 y.o.femalethat arrives via EMS found unresponsive at her long-term care facility x 24 hrs Patient arrives unresponsive with agonal respirations. Is confirmed with EMS that patient is a full code and patient was immediately intubated for airway protection. Patient with history of Multiple myeloma-presented also with acute and severe hypotension, severe resp failure, severe acidosis, severe hypercalcemia, with severe renal failure   ER COURSE Patient with diagnosis of multiorgan failure  Patient given 8L of IVF's Placed on MV support on pressors   HOSPITAL COURSE/EVENTS 3/1 admission to ICU severe multiorgan failure 3/2 multiorgan failure 3/3 multiorgan failure, son refused PICC line, unable to draw labs      CC  follow up respiratory failure  SUBJECTIVE Patient remains critically ill Prognosis is guarded Remains comatosed multiorgan failure   BP (!) 132/54   Pulse (!) 105   Temp 100.04 F (37.8 C)   Resp (!) 28   Ht 5' 2.01" (1.575 m)   Wt 67.2 kg   SpO2 100%   BMI 27.09 kg/m    I/O last 3 completed shifts: In: 7285.5 [I.V.:3593.6; IV Piggyback:3691.9] Out: 2025 [Urine:1225; Stool:800] No intake/output data recorded.  SpO2: 100 % FiO2 (%): 30 %  Estimated body mass index is 27.09 kg/m as calculated from the following:   Height as of this encounter: 5' 2.01" (1.575 m).   Weight as of this encounter: 67.2 kg.  SIGNIFICANT EVENTS   REVIEW OF SYSTEMS  PATIENT IS UNABLE TO PROVIDE COMPLETE REVIEW OF SYSTEMS DUE TO SEVERE CRITICAL ILLNESS        PHYSICAL EXAMINATION:  GENERAL:critically ill appearing, +resp distress EYES: Pupils equal, round, reactive to light.  No scleral icterus.  MOUTH: Moist mucosal membrane. NECK: Supple.  PULMONARY: +rhonchi, +wheezing CARDIOVASCULAR: S1 and S2. Regular rate and rhythm. No murmurs, rubs, or gallops.  GASTROINTESTINAL: Soft, nontender, -distended.  Positive bowel sounds.    MUSCULOSKELETAL: No swelling, clubbing, or edema.  NEUROLOGIC: obtunded, GCS<8 SKIN:intact,warm,dry  MEDICATIONS: I have reviewed all medications and confirmed regimen as documented   CULTURE RESULTS   Recent Results (from the past 240 hour(s))  Culture, blood (Routine x 2)     Status: None (Preliminary result)   Collection Time: 03/14/2020 12:02 PM   Specimen: BLOOD  Result Value Ref Range Status   Specimen Description BLOOD RIGHT AC  Final   Special Requests   Final    BOTTLES DRAWN AEROBIC AND ANAEROBIC Blood Culture adequate volume   Culture   Final    NO GROWTH < 24 HOURS Performed at [facility], [address], [place], [place] [address]    Report Status PENDING  Incomplete  Culture, blood (Routine x 2)     Status: None (Preliminary result)   Collection Time: 03/28/2020  1:41 PM   Specimen: BLOOD  Result Value Ref Range Status   Specimen Description BLOOD LEFT WRIST  Final   Special Requests   Final    BOTTLES DRAWN AEROBIC AND ANAEROBIC Blood Culture results may not be optimal due to an inadequate volume of blood received in culture bottles   Culture   Final    NO GROWTH < 24 HOURS Performed at [facility], [address] [place],  [address]    Report Status PENDING  Incomplete  Resp Panel by RT-PCR (Flu A&B, Covid) Nasopharyngeal Swab     Status: None   Collection Time: 04/07/2020  4:03 PM   Specimen: Nasopharyngeal Swab; Nasopharyngeal(NP) swabs in vial transport medium  Result Value Ref Range Status   SARS Coronavirus 2 by RT PCR NEGATIVE NEGATIVE Final    Comment: (NOTE) SARS-CoV-2 target nucleic acids are NOT DETECTED.  The SARS-CoV-2 RNA is generally detectable in upper respiratory specimens during the acute phase of infection. The lowest concentration of SARS-CoV-2 viral copies this assay can detect is 138 copies/mL. A negative result does not preclude SARS-Cov-2 infection and should not be used as the sole basis for treatment  or other patient management decisions. A negative result may occur with  improper specimen collection/handling, submission of specimen other than nasopharyngeal swab, presence of viral mutation(s) within the areas targeted by this assay, and inadequate number of viral copies(<138 copies/mL). A negative result must be combined with clinical observations, patient history, and epidemiological information. The expected result is Negative.  Fact Sheet for Patients:  EntrepreneurPulse.com.au  Fact Sheet for Healthcare Providers:  IncredibleEmployment.be  This test is no t yet approved or cleared by the [place] FDA and  has been authorized for detection and/or diagnosis of SARS-CoV-2 by FDA under an Emergency Use Authorization (EUA). This EUA will remain  in effect (meaning this test can be used) for the duration of the COVID-19 declaration under Section 564(b)(1) of the Act, 21 U.S.C.section 360bbb-3(b)(1), unless the authorization is terminated  or revoked sooner.       Influenza A by PCR NEGATIVE NEGATIVE Final   Influenza B by PCR NEGATIVE NEGATIVE Final    Comment: (NOTE) The Xpert Xpress SARS-CoV-2/FLU/RSV plus assay is intended as an aid in the diagnosis of influenza from Nasopharyngeal swab specimens and should not be used as a sole basis for treatment. Nasal washings and aspirates are unacceptable for Xpert Xpress SARS-CoV-2/FLU/RSV testing.  Fact Sheet for Patients: EntrepreneurPulse.com.au  Fact Sheet for Healthcare Providers: IncredibleEmployment.be  This test is not yet approved or cleared by the [place] FDA and has been authorized for detection and/or diagnosis of SARS-CoV-2 by FDA under an Emergency Use Authorization (EUA). This EUA will remain in effect (meaning this test can be used) for the duration of the COVID-19 declaration under Section 564(b)(1) of the Act, 21 U.S.C. section  360bbb-3(b)(1), unless the authorization is terminated or revoked.  Performed at [facility], [address] [place], [place] [address]   MRSA PCR Screening     Status: None   Collection Time: 03/13/2020  5:09 PM   Specimen: Nasopharyngeal  Result Value Ref Range Status   MRSA by PCR NEGATIVE NEGATIVE Final    Comment:        The GeneXpert MRSA Assay (FDA approved for NASAL specimens only), is one component of a comprehensive MRSA colonization surveillance program. It is not intended to diagnose MRSA infection nor to guide or monitor treatment for MRSA infections. Performed at [facility], [place], [place] [address]   C Difficile Quick Screen w PCR reflex     Status: Abnormal   Collection Time: 03/12/20  8:42 AM   Specimen: STOOL  Result Value Ref Range Status   C Diff antigen POSITIVE (A) NEGATIVE Final   C Diff toxin NEGATIVE NEGATIVE Final   C Diff interpretation Results are indeterminate. See PCR results.  Final    Comment: Performed at [facility], [address] [place], [place] [address]  C. Diff by PCR, Reflexed     Status: None   Collection Time: 03/12/20  8:42 AM  Result Value Ref Range Status   Toxigenic C. Difficile by PCR NEGATIVE NEGATIVE  Final    Comment: Patient is colonized with non toxigenic C. difficile. May not need treatment unless significant symptoms are present. Performed at [facility], [address] [place], [place] [address]           IMAGING    [place] EKG SITE RITE  Result Date: 03/12/2020 If [employer] image not attached, placement could not be confirmed due to current cardiac rhythm.    Nutrition Status: Nutrition Problem: Inadequate oral intake Etiology: inability to eat Signs/Symptoms: NPO status Interventions: Refer to RD note for recommendations     Indwelling Urinary Catheter continued, requirement due to   Reason to continue Indwelling Urinary Catheter strict  Intake/Output monitoring for hemodynamic instability   Central Line/ continued, requirement due to  Reason to continue [place] of central venous pressure or other hemodynamic parameters and poor IV access   Ventilator continued, requirement due to severe respiratory failure   Ventilator Sedation RASS 0 to -2      ASSESSMENT AND PLAN SYNOPSIS   80 yo white female with acute and severe hypoxic respiratory failure with severe toxic metabolic encephalopathy from severe septic shock present on admission with severe acidosis due to UTI and aspiration pneumonia associated with severe hypercalcemia and severe acute renal failure with h/o multiple myeloma, 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  Severe ACUTE Hypoxic and Hypercapnic Respiratory Failure -continue Full MV support -continue Bronchodilator Therapy -Wean Fio2 and PEEP as tolerated -VAP/VENT bundle implementation   ACUTE KIDNEY INJURY/Renal Failure -continue Foley Catheter-assess need -Avoid nephrotoxic agents -Follow urine output, BMP -Ensure adequate renal perfusion, optimize oxygenation -Renal dose medications     NEUROLOGY Acute toxic metabolic encephalopathy,   SHOCK-SEPSIS -use vasopressors to keep MAP>65 -follow ABG and LA -follow up cultures -emperic ABX   CARDIAC ICU monitoring  ID -continue IV abx as prescibed -follow up cultures  GI GI PROPHYLAXIS as indicated   DIET-->NPO Constipation protocol as indicated  ENDO - will use ICU hypoglycemic\Hyperglycemia protocol if indicated     ELECTROLYTES -follow labs as needed -replace as needed -pharmacy consultation and following   DVT/GI PRX ordered and assessed TRANSFUSIONS AS NEEDED MONITOR FSBS I Assessed the need for Labs I Assessed the need for Foley I Assessed the need for Central Venous Line Family Discussion when available I Assessed the need for Mobilization I made an Assessment of  medications to be adjusted accordingly Safety Risk assessment completed   CASE DISCUSSED IN MULTIDISCIPLINARY ROUNDS WITH ICU TEAM  Critical Care Time devoted to patient care services described in this note is 56 minutes.   Overall, patient is critically ill, prognosis is guarded.  Patient with Multiorgan failure and at high risk for cardiac arrest and death.    Patient is DNR status, very poor chance of meaningful recovery palliative are team consulted  Patient is in dying process and suffering  Kurian Patricia Pesa, M.D.  [facility]  Medical Director [place] Director [facility] Cardio-Pulmonary Department

## 2020-03-13 NOTE — Progress Notes (Signed)
Chloe RN aware of PICC in place and need to d/c PIV's x 2 and to change tubing before using PICC.  Family aware PICC placed and going back to bedside.

## 2020-03-13 NOTE — Progress Notes (Signed)
[facility]  ROUNDING NOTE   Subjective:  Patient remains critically ill at this point time requiring ventilatory support. Urine output was 690 cc over the preceding 24 hours.\ No new renal function testing today.  Objective:  Vital signs in last 24 hours:  Temp:  [100.04 F (37.8 C)-101.84 F (38.8 C)] 100.04 F (37.8 C) (03/03 0300) Pulse Rate:  [105-113] 105 (03/03 0100) Resp:  [11-30] 28 (03/03 0300) BP: (91-137)/(40-66) 132/54 (03/03 0300) SpO2:  [97 %-100 %] 100 % (03/03 0100) FiO2 (%):  [30 %] 30 % (03/03 0419) Weight:  [67.2 kg] 67.2 kg (03/03 0500)  Weight change: -0.6 kg Filed Weights   03/18/2020 1715 03/12/20 0157 03/13/20 0500  Weight: 61.8 kg 62.2 kg 67.2 kg    Intake/Output: I/O last 3 completed shifts: In: 7285.5 [I.V.:3593.6; IV Piggyback:3691.9] Out: 2025 [Urine:1225; Stool:800]   Intake/Output this shift:  No intake/output data recorded.  Physical Exam: General:  Critically ill-appearing  Head:  Normocephalic, atraumatic. Endotracheal tube in place  Eyes:  Closed  Neck:  Supple  Lungs:   Bilateral rhonchi, vent assisted  Heart:  S1S2 no rubs  Abdomen:   Soft, nontender, bowel sounds present  Extremities:  1+ peripheral edema.  Neurologic:  Intubated, sedated  Skin:  No acute rash  Access:  No hemodialysis access    Basic Metabolic Panel: Recent Labs  Lab 03/12/2020 1342 03/27/2020 2139 03/12/20 0441  NA 147*  --  148*  K 2.8* 2.4* 3.4*  CL 113*  --  117*  CO2 16*  --  18*  GLUCOSE 259*  --  120*  BUN 56*  --  53*  CREATININE 3.01*  --  2.46*  CALCIUM >15.0*  --  14.1*  MG  --  1.2* 3.1*  PHOS  --  3.5 2.3*    Liver Function Tests: Recent Labs  Lab 03/17/2020 1342  AST 156*  ALT 64*  ALKPHOS 61  BILITOT 1.2  PROT 7.1  ALBUMIN 2.9*   No results for input(s): LIPASE, AMYLASE in the last 168 hours. No results for input(s): AMMONIA in the last 168 hours.  CBC: Recent Labs  Lab 04/07/2020 1342 03/12/20 0441  WBC  5.6 2.0*  NEUTROABS 0.7*  --   HGB 11.8* 11.1*  HCT 40.2 34.6*  MCV 97.6 92.5  PLT 127* PLATELET CLUMPS NOTED ON SMEAR, UNABLE TO ESTIMATE    Cardiac Enzymes: No results for input(s): CKTOTAL, CKMB, CKMBINDEX, TROPONINI in the last 168 hours.  BNP: Invalid input(s): POCBNP  CBG: Recent Labs  Lab 03/12/20 1547 03/12/20 1938 03/13/20 0003 03/13/20 0402 03/13/20 0716  GLUCAP 130* 152* 155* 136* 113*    Microbiology: Results for orders placed or performed during the hospital encounter of 03/18/2020  Culture, blood (Routine x 2)     Status: None (Preliminary result)   Collection Time: 04/04/2020 12:02 PM   Specimen: BLOOD  Result Value Ref Range Status   Specimen Description BLOOD RIGHT [facility]  Final   Special Requests   Final    BOTTLES DRAWN AEROBIC AND ANAEROBIC Blood Culture adequate volume   Culture   Final    NO GROWTH < 24 HOURS Performed at [facility], [address] [place], [place] [address]    Report Status PENDING  Incomplete  Culture, blood (Routine x 2)     Status: None (Preliminary result)   Collection Time: 03/31/2020  1:41 PM   Specimen: BLOOD  Result Value Ref Range Status   Specimen Description BLOOD  LEFT WRIST  Final   Special Requests   Final    BOTTLES DRAWN AEROBIC AND ANAEROBIC Blood Culture results may not be optimal due to an inadequate volume of blood received in culture bottles   Culture   Final    NO GROWTH < 24 HOURS Performed at [facility], [address], [place], [place] [address]    Report Status PENDING  Incomplete  Resp Panel by RT-PCR (Flu A&B, Covid) Nasopharyngeal Swab     Status: None   Collection Time: 04/04/2020  4:03 PM   Specimen: Nasopharyngeal Swab; Nasopharyngeal(NP) swabs in vial transport medium  Result Value Ref Range Status   SARS Coronavirus 2 by RT PCR NEGATIVE NEGATIVE Final    Comment: (NOTE) SARS-CoV-2 target nucleic acids are NOT DETECTED.  The SARS-CoV-2 RNA is generally detectable in upper  respiratory specimens during the acute phase of infection. The lowest concentration of SARS-CoV-2 viral copies this assay can detect is 138 copies/mL. A negative result does not preclude SARS-Cov-2 infection and should not be used as the sole basis for treatment or other patient management decisions. A negative result may occur with  improper specimen collection/handling, submission of specimen other than nasopharyngeal swab, presence of viral mutation(s) within the areas targeted by this assay, and inadequate number of viral copies(<138 copies/mL). A negative result must be combined with clinical observations, patient history, and epidemiological information. The expected result is Negative.  Fact Sheet for Patients:  EntrepreneurPulse.com.au  Fact Sheet for Healthcare Providers:  IncredibleEmployment.be  This test is no t yet approved or cleared by the [place] FDA and  has been authorized for detection and/or diagnosis of SARS-CoV-2 by FDA under an Emergency Use Authorization (EUA). This EUA will remain  in effect (meaning this test can be used) for the duration of the COVID-19 declaration under Section 564(b)(1) of the Act, 21 U.S.C.section 360bbb-3(b)(1), unless the authorization is terminated  or revoked sooner.       Influenza A by PCR NEGATIVE NEGATIVE Final   Influenza B by PCR NEGATIVE NEGATIVE Final    Comment: (NOTE) The Xpert Xpress SARS-CoV-2/FLU/RSV plus assay is intended as an aid in the diagnosis of influenza from Nasopharyngeal swab specimens and should not be used as a sole basis for treatment. Nasal washings and aspirates are unacceptable for Xpert Xpress SARS-CoV-2/FLU/RSV testing.  Fact Sheet for Patients: EntrepreneurPulse.com.au  Fact Sheet for Healthcare Providers: IncredibleEmployment.be  This test is not yet approved or cleared by the [place] FDA and has been  authorized for detection and/or diagnosis of SARS-CoV-2 by FDA under an Emergency Use Authorization (EUA). This EUA will remain in effect (meaning this test can be used) for the duration of the COVID-19 declaration under Section 564(b)(1) of the Act, 21 U.S.C. section 360bbb-3(b)(1), unless the authorization is terminated or revoked.  Performed at [facility], [address] [place], [place] [address]   MRSA PCR Screening     Status: None   Collection Time: 03/30/2020  5:09 PM   Specimen: Nasopharyngeal  Result Value Ref Range Status   MRSA by PCR NEGATIVE NEGATIVE Final    Comment:        The GeneXpert MRSA Assay (FDA approved for NASAL specimens only), is one component of a comprehensive MRSA colonization surveillance program. It is not intended to diagnose MRSA infection nor to guide or monitor treatment for MRSA infections. Performed at [facility], [address] [place], [place] [address]   C Difficile Quick Screen w  PCR reflex     Status: Abnormal   Collection Time: 03/12/20  8:42 AM   Specimen: STOOL  Result Value Ref Range Status   C Diff antigen POSITIVE (A) NEGATIVE Final   C Diff toxin NEGATIVE NEGATIVE Final   C Diff interpretation Results are indeterminate. See PCR results.  Final    Comment: Performed at [facility], [address] [place], [place] [address]  C. Diff by PCR, Reflexed     Status: None   Collection Time: 03/12/20  8:42 AM  Result Value Ref Range Status   Toxigenic C. Difficile by PCR NEGATIVE NEGATIVE Final    Comment: Patient is colonized with non toxigenic C. difficile. May not need treatment unless significant symptoms are present. Performed at [facility], [address] [place], [place] [address]     Coagulation Studies: Recent Labs    03/31/2020 1342  LABPROT 16.0*  INR 1.3*    Urinalysis: Recent Labs    03/22/2020 1223  COLORURINE YELLOW*  LABSPEC 1.017  PHURINE 5.0  GLUCOSEU  NEGATIVE  HGBUR SMALL*  BILIRUBINUR NEGATIVE  KETONESUR NEGATIVE  PROTEINUR 100*  NITRITE NEGATIVE  LEUKOCYTESUR SMALL*      Imaging: CT Head Wo Contrast  Result Date: 04/04/2020 CLINICAL DATA:  Mental status change. EXAM: CT HEAD WITHOUT CONTRAST TECHNIQUE: Contiguous axial images were obtained from the base of the skull through the vertex without intravenous contrast. COMPARISON:  CT head, 02/02/2020 FINDINGS: Brain: No evidence of acute infarction, hemorrhage, hydrocephalus, extra-axial collection or mass lesion/mass effect. Generalized cerebral atrophy with ex vacuo ventricular dilation, not substantially changed from priors. Vascular: Calcific atherosclerosis. No hyperdense vessel identified. Skull: No acute fracture. Sinuses/Orbits: Visualized sinuses are clear.  Unremarkable orbits. Other: No mastoid effusions. IMPRESSION: 1. No evidence of acute intracranial abnormality. 2. Similar chronic microvascular ischemic disease and generalized cerebral volume loss. Electronically Signed   By: Margaretha Sheffield MD   On: 03/26/2020 15:12   DG Chest Port 1 View  Result Date: 03/12/2020 CLINICAL DATA:  Respiratory failure EXAM: PORTABLE CHEST 1 VIEW COMPARISON:  03/31/2020 FINDINGS: Endotracheal tube is seen 4 cm above the carina. Nasogastric tube extends into the gastric fundus. Developing bibasilar opacities, right greater than left, in keeping with atelectasis or focal infiltrate. No pneumothorax or pleural effusion. 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 The pulmonary vascularity is normal. IMPRESSION: Progressive bibasilar atelectasis or infiltrate. Stable support tubes. Electronically Signed   By: Fidela Salisbury MD   On: 03/12/2020 04:42   DG Chest Portable 1 View  Result Date: 03/20/2020 CLINICAL DATA:  Intubation. EXAM: PORTABLE CHEST 1 VIEW COMPARISON:  CT 02/02/2020.  Chest x-ray 02/02/2020. FINDINGS: Endotracheal tube and NG tube in stable position.  Prior CABG. Heart size normal. Mild bibasilar atelectasis. Mild bibasilar infiltrates cannot be excluded. No pleural effusion or pneumothorax. Old left rib fractures noted. IMPRESSION: 1. Endotracheal tube and NG tube in stable position. 2. Mild bibasilar atelectasis. Mild bibasilar infiltrates cannot be excluded. Electronically Signed   By: Marcello Moores  Register   On: 03/18/2020 13:06   [place] EKG SITE RITE  Result Date: 03/12/2020 If Site Rite image not attached, placement could not be confirmed due to current cardiac rhythm.    Medications:   . sodium chloride 50 mL/hr at 03/13/20 0353  . sodium chloride    . sodium chloride Stopped (03/20/2020 1712)  . famotidine (PEPCID) IV Stopped (03/12/20 1022)  . norepinephrine (LEVOPHED) Adult infusion 8 mcg/min (03/13/20 0636)   .  calcitonin  4 Units/kg Intramuscular BID  . chlorhexidine gluconate (MEDLINE KIT)  15 mL Mouth Rinse BID  . Chlorhexidine Gluconate Cloth  6 each Topical Daily  . enoxaparin (LOVENOX) injection  30 mg Subcutaneous Q24H  . influenza vaccine adjuvanted  0.5 mL Intramuscular Tomorrow-1000  . insulin aspart  0-15 Units Subcutaneous Q4H  . mouth rinse  15 mL Mouth Rinse 10 times per day  . pantoprazole (PROTONIX) IV  40 mg Intravenous Q24H  . pneumococcal 23 valent vaccine  0.5 mL Intramuscular Tomorrow-1000  . sodium chloride flush  3 mL Intravenous Q12H   sodium chloride, acetaminophen, docusate, ondansetron (ZOFRAN) IV, polyethylene glycol, sodium chloride flush  Assessment/ Plan:  80 y.o. female with a PMHx of multiple myeloma, CLL, coronary disease status post CABG, diabetes mellitus type 2, chronic kidney disease stage IIIA baseline EGFR 4, who was admitted to [facility] on 04/07/2020 for evaluation of acute respiratory failure.  1.  Acute kidney injury most likely due to ATN hypercalcemia/chronic kidney disease stage IIIa baseline EGFR 47/hypercalcemia in the setting of multiple myeloma and CLL. Patient remains critically ill.  Continues to have relatively low urine output. No new renal function testing performed today. Family considering palliative care and comfort care. Would not recommend dialysis in the situation as the cause of her hypercalcemia and acute kidney injury not able to be treated effectively. Await further input from family.  2. Acute respiratory failure. Patient remains on the ventilator at this time. Plan as per pulmonary/critical care.   LOS: 2 Gloristine Turrubiates 3/3/20228:13 AM

## 2020-03-13 NOTE — Progress Notes (Signed)
*  PRELIMINARY RESULTS* Echocardiogram 2D Echocardiogram has been performed.  [name] 03/13/2020, 1:23 PM

## 2020-03-13 NOTE — Progress Notes (Signed)
GOALS OF CARE DISCUSSION  The Clinical status was relayed to family in detail. Son and Daughter  Updated and notified of patients medical condition.  Patient remains unresponsive and will not open eyes to command.    Patient is having a weak cough and struggling to remove secretions.   Patient with increased WOB and using accessory muscles to breathe Explained to family course of therapy and the modalities     Patient with Progressive multiorgan failure with a very high probablity of a very minimal chance of meaningful recovery despite all aggressive and optimal medical therapy. Patient is in the Dying  Process associated with Suffering.  Family understands the situation. Plan for IV abx and several more days of aggressive medical therapy Consider Artifical TF's as discussed with family   They have already consented and agreed to DNR/DNI  Family are satisfied with Plan of action and management. All questions answered  Additional CC time 32 mins   Norina Cowper Patricia Pesa, M.D.  [facility]  Medical Director [place] Director [facility] Cardio-Pulmonary Department

## 2020-03-13 NOTE — Progress Notes (Signed)
Spoke with Chloe RN re PICC order.  States will notify once son makes decision re PICC placement.

## 2020-03-13 NOTE — Progress Notes (Signed)
Pharmacy Antibiotic Note  [name] is a 80 y.o. female admitted on 03/23/2020. Patient with hypercalcemia, receiving treatment. Febrile, fever curve trending down. Pharmacy has been consulted for vancomycin dosing.  Plan: Previously received vancomycin 1250 mg IV ~ 1600 on 3/1. Random level today 12. Will dose vancomycin 750 mg IV q12h. Goal AUC 400-550 Expected AUC: 455 SCr used: 2.41  Patient also on cefepime 2 g IV q24h + Flagyl 500 mg IV q8h   Height: 5' 2.01" (157.5 cm) Weight: 67.2 kg (148 lb 2.4 oz) IBW/kg (Calculated) : 50.12  Temp (24hrs), Avg:100.3 F (37.9 C), Min:100.04 F (37.8 C), Max:101.66 F (38.7 C)  Recent Labs  Lab 03/24/2020 1342 03/28/2020 1343 04/04/2020 1601 03/20/2020 2139 03/12/20 0441 03/13/20 1122 03/13/20 1246  WBC 5.6  --   --   --  2.0* 3.2*  --   CREATININE 3.01*  --   --   --  2.46* 2.41*  --   LATICACIDVEN  --  8.6* 5.3* 6.0* 6.6*  --   --   VANCORANDOM  --   --   --   --   --   --  12    Estimated Creatinine Clearance: 17.3 mL/min (A) (by C-G formula based on SCr of 2.41 mg/dL (H)).    Allergies  Allergen Reactions  . Benzodiazepines Anxiety and Other (See Comments)    Confusion, also  . Iodinated Diagnostic Agents Swelling    "Head swells"  . Other Swelling    Smoke--Head swelling  . Statins Swelling    "Head swells"  . Ticagrelor Shortness Of Breath  . Metoclopramide Other (See Comments)    Causes tremors and insomnia  . Amoxicillin Hives and Rash  . Codeine Hives and Rash  . Duloxetine Nausea Only  . Sulfa Antibiotics Hives and Rash    Antimicrobials this admission: Vancomycin 3/1 >> Cefepime 3/1 x 1, 3/3 >> Metronidazole 3/1 x 1, 3/3 >>  Microbiology results: 3/1 BCx: NG  3/1 MRSA PCR: negative  Thank you for allowing pharmacy to be a part of this patient's care.  [name], PharmD 03/13/2020 2:21 PM

## 2020-03-14 ENCOUNTER — Inpatient Hospital Stay: Payer: Medicare Other

## 2020-03-14 DIAGNOSIS — G939 Disorder of brain, unspecified: Secondary | ICD-10-CM | POA: Diagnosis not present

## 2020-03-14 DIAGNOSIS — J9601 Acute respiratory failure with hypoxia: Secondary | ICD-10-CM | POA: Diagnosis not present

## 2020-03-14 DIAGNOSIS — R40243 Glasgow coma scale score 3-8, unspecified time: Secondary | ICD-10-CM

## 2020-03-14 LAB — BASIC METABOLIC PANEL
Anion gap: 10 (ref 5–15)
Anion gap: 6 (ref 5–15)
BUN: 62 mg/dL — ABNORMAL HIGH (ref 8–23)
BUN: 63 mg/dL — ABNORMAL HIGH (ref 8–23)
CO2: 18 mmol/L — ABNORMAL LOW (ref 22–32)
CO2: 21 mmol/L — ABNORMAL LOW (ref 22–32)
Calcium: 10.2 mg/dL (ref 8.9–10.3)
Calcium: 10.5 mg/dL — ABNORMAL HIGH (ref 8.9–10.3)
Chloride: 109 mmol/L (ref 98–111)
Chloride: 111 mmol/L (ref 98–111)
Creatinine, Ser: 2.01 mg/dL — ABNORMAL HIGH (ref 0.44–1.00)
Creatinine, Ser: 2.15 mg/dL — ABNORMAL HIGH (ref 0.44–1.00)
GFR, Estimated: 23 mL/min — ABNORMAL LOW (ref >60)
GFR, Estimated: 25 mL/min — ABNORMAL LOW (ref >60)
Glucose, Bld: 257 mg/dL — ABNORMAL HIGH (ref 70–99)
Glucose, Bld: 288 mg/dL — ABNORMAL HIGH (ref 70–99)
Potassium: 3.6 mmol/L (ref 3.5–5.1)
Potassium: 3.8 mmol/L (ref 3.5–5.1)
Sodium: 137 mmol/L (ref 135–145)
Sodium: 138 mmol/L (ref 135–145)

## 2020-03-14 LAB — CBC
HCT: 22.7 % — ABNORMAL LOW (ref 36.0–46.0)
Hemoglobin: 7.4 g/dL — ABNORMAL LOW (ref 12.0–15.0)
MCH: 28.9 pg (ref 26.0–34.0)
MCHC: 32.6 g/dL (ref 30.0–36.0)
MCV: 88.7 fL (ref 80.0–100.0)
Platelets: 49 10*3/uL — ABNORMAL LOW (ref 150–400)
RBC: 2.56 MIL/uL — ABNORMAL LOW (ref 3.87–5.11)
RDW: 19 % — ABNORMAL HIGH (ref 11.5–15.5)
WBC: 2.6 10*3/uL — ABNORMAL LOW (ref 4.0–10.5)
nRBC: 0 % (ref 0.0–0.2)

## 2020-03-14 LAB — LACTIC ACID, PLASMA: Lactic Acid, Venous: 2.6 mmol/L (ref 0.5–1.9)

## 2020-03-14 LAB — GLUCOSE, CAPILLARY
Glucose-Capillary: 190 mg/dL — ABNORMAL HIGH (ref 70–99)
Glucose-Capillary: 211 mg/dL — ABNORMAL HIGH (ref 70–99)
Glucose-Capillary: 233 mg/dL — ABNORMAL HIGH (ref 70–99)
Glucose-Capillary: 162 mg/dL — ABNORMAL HIGH (ref 70–99)

## 2020-03-14 LAB — PHOSPHORUS: Phosphorus: 2.6 mg/dL (ref 2.5–4.6)

## 2020-03-14 LAB — MAGNESIUM: Magnesium: 2 mg/dL (ref 1.7–2.4)

## 2020-03-14 MED ORDER — ACETAMINOPHEN 650 MG RE SUPP: 650.0000 mg | Freq: Four times a day (QID) | RECTAL | Status: DC | PRN

## 2020-03-14 MED ORDER — MAGNESIUM SULFATE 2 GM/50ML IV SOLN
2.0000 g | Freq: Once | INTRAVENOUS | Status: AC
  Administered 2020-03-14: 2 g via INTRAVENOUS
  Filled 2020-03-14: qty 50

## 2020-03-14 MED ORDER — ACETAMINOPHEN 325 MG PO TABS: 650.0000 mg | ORAL_TABLET | Freq: Four times a day (QID) | ORAL | Status: DC | PRN

## 2020-03-14 MED ORDER — POLYVINYL ALCOHOL 1.4 % OP SOLN
1.0000 [drp] | Freq: Four times a day (QID) | OPHTHALMIC | Status: DC | PRN
  Filled 2020-03-14: qty 15

## 2020-03-14 MED ORDER — MORPHINE 100MG IN NS 100ML (1MG/ML) PREMIX INFUSION
0.0000 mg/h | INTRAVENOUS | Status: DC
  Administered 2020-03-14: 5 mg/h via INTRAVENOUS
  Administered 2020-03-15: 17 mg/h via INTRAVENOUS
  Filled 2020-03-14 (×2): qty 100

## 2020-03-14 MED ORDER — GLYCOPYRROLATE 1 MG PO TABS
1.0000 mg | ORAL_TABLET | ORAL | Status: DC | PRN
  Filled 2020-03-14: qty 1

## 2020-03-14 MED ORDER — VANCOMYCIN HCL 1000 MG/200ML IV SOLN: 1000.0000 mg | INTRAVENOUS | Status: DC

## 2020-03-14 MED ORDER — GLYCOPYRROLATE 0.2 MG/ML IJ SOLN: 0.2000 mg | INTRAMUSCULAR | Status: DC | PRN

## 2020-03-14 MED ORDER — POTASSIUM CHLORIDE 10 MEQ/100ML IV SOLN
10.0000 meq | INTRAVENOUS | Status: AC
  Administered 2020-03-14 (×3): 10 meq via INTRAVENOUS
  Filled 2020-03-14 (×3): qty 100

## 2020-03-14 MED ORDER — MIDAZOLAM HCL 2 MG/2ML IJ SOLN
2.0000 mg | INTRAMUSCULAR | Status: DC | PRN
  Administered 2020-03-14 – 2020-03-15 (×2): 2 mg via INTRAVENOUS
  Filled 2020-03-14 (×2): qty 2

## 2020-03-14 MED ORDER — MORPHINE BOLUS VIA INFUSION
5.0000 mg | INTRAVENOUS | Status: DC | PRN
  Filled 2020-03-14: qty 5

## 2020-03-14 MED ORDER — DEXTROSE 5 % IV SOLN: INTRAVENOUS | Status: DC

## 2020-03-14 MED ORDER — DIPHENHYDRAMINE HCL 50 MG/ML IJ SOLN
25.0000 mg | INTRAMUSCULAR | Status: DC | PRN
Start: 2020-03-14 — End: 2020-03-15

## 2020-03-14 MED ORDER — MORPHINE SULFATE (PF) 2 MG/ML IV SOLN
2.0000 mg | INTRAVENOUS | Status: DC | PRN
  Administered 2020-03-14: 2 mg via INTRAVENOUS
  Filled 2020-03-14: qty 1

## 2020-03-14 MED ORDER — INSULIN ASPART 100 UNIT/ML ~~LOC~~ SOLN
3.0000 [IU] | SUBCUTANEOUS | Status: DC
  Administered 2020-03-14 (×2): 3 [IU] via SUBCUTANEOUS
  Filled 2020-03-14 (×2): qty 1

## 2020-03-14 NOTE — Plan of Care (Signed)

## 2020-03-14 NOTE — Progress Notes (Signed)
CRITICAL CARE NOTE  80 y.o.femalethat arrives via EMS found unresponsive at her long-term care facility x 24 hrs Patient arrives unresponsive with agonal respirations. Is confirmed with EMS that patient is a full code and patient was immediately intubated for airway protection. Patient with history of Multiple myeloma-presented also with acute and severe hypotension, severe resp failure, severe acidosis, severe hypercalcemia, with severe renal failure   ER COURSE Patient with diagnosis of multiorgan failure  Patient given 8L of IVF's Placed on MV support on pressors   HOSPITAL COURSE/EVENTS 3/1 admission to ICU severe multiorgan failure 3/2 multiorgan failure 3/3 multiorgan failure, son refused PICC line, unable to draw labs 3/4 son has agreed for PICC line, multiorgan failure   CC  follow up respiratory failure  SUBJECTIVE Patient remains critically ill Prognosis is guarded Severe hypercalcemia Remains obtunded   Vent Mode: PRVC FiO2 (%):  [30 %] 30 % Set Rate:  [16 bmp] 16 bmp Vt Set:  [450 mL] 450 mL PEEP:  [5 cmH20] 5 cmH20 _0 CBC    Component Value Date/Time   WBC 2.6 (L) 03/14/2020 0530   RBC 2.56 (L) 03/14/2020 0530   HGB 7.4 (L) 03/14/2020 0530   HGB 10.9 (L) 06/03/2016 1102   HCT 22.7 (L) 03/14/2020 0530   HCT 32.8 (L) 10/06/2016 1608   PLT 49 (L) 03/14/2020 0530   PLT 256 06/03/2016 1102   MCV 88.7 03/14/2020 0530   MCV 83 06/03/2016 1102   MCH 28.9 03/14/2020 0530   MCHC 32.6 03/14/2020 0530   RDW 19.0 (H) 03/14/2020 0530   RDW 15.8 (H) 06/03/2016 1102   LYMPHSABS 4.3 (H) 03/13/2020 1342   LYMPHSABS 6.5 (H) 06/03/2016 1102   MONOABS 0.6 04/07/2020 1342   EOSABS 0.0 03/25/2020 1342   EOSABS 0.2 06/03/2016 1102   BASOSABS 0.0 04/07/2020 1342   BASOSABS 0.0 06/03/2016 1102   BMP Latest Ref Rng & Units 03/14/2020 03/13/2020 03/13/2020  Glucose 70 - 99 mg/dL 288(H) 257(H) 139(H)  BUN 8 - 23 mg/dL 62(H) 63(H) 62(H)  Creatinine 0.44 - 1.00  mg/dL 2.01(H) 2.15(H) 2.41(H)  BUN/Creat Ratio 12 - 28 - - -  Sodium 135 - 145 mmol/L 138 137 140  Potassium 3.5 - 5.1 mmol/L 3.8 3.6 4.1  Chloride 98 - 111 mmol/L 111 109 111  CO2 22 - 32 mmol/L 21(L) 18(L) 17(L)  Calcium 8.9 - 10.3 mg/dL 10.2 10.5(H) 11.8(H)      BP (!) 146/42   Pulse (!) 104   Temp 98.96 F (37.2 C) (Bladder)   Resp (!) 25   Ht 5' 2.01" (1.575 m)   Wt 67.4 kg   SpO2 100%   BMI 27.17 kg/m    I/O last 3 completed shifts: In: 3772.8 [I.V.:2750.9; NG/GT:220; IV Piggyback:801.9] Out: 8841 [YSAYT:0160; Stool:450] No intake/output data recorded.  SpO2: 100 % FiO2 (%): 30 %  Estimated body mass index is 27.17 kg/m as calculated from the following:   Height as of this encounter: 5' 2.01" (1.575 m).   Weight as of this encounter: 67.4 kg.  SIGNIFICANT EVENTS   REVIEW OF SYSTEMS  PATIENT IS UNABLE TO PROVIDE COMPLETE REVIEW OF SYSTEMS DUE TO SEVERE CRITICAL ILLNESS        PHYSICAL EXAMINATION:  GENERAL:critically ill appearing, +resp distress EYES: Pupils equal, round, reactive to light.  No scleral icterus.  MOUTH: Moist mucosal membrane. NECK: Supple.  PULMONARY: +rhonchi, +wheezing CARDIOVASCULAR: S1 and S2. Regular rate and rhythm. No murmurs, rubs, or gallops.  GASTROINTESTINAL: Soft, nontender, -  distended.  Positive bowel sounds.   MUSCULOSKELETAL: No swelling, clubbing, or edema.  NEUROLOGIC: obtunded, GCS<8 SKIN:intact,warm,dry  MEDICATIONS: I have reviewed all medications and confirmed regimen as documented   CULTURE RESULTS   Recent Results (from the past 240 hour(s))  Culture, blood (Routine x 2)     Status: None (Preliminary result)   Collection Time: 03/14/2020 12:02 PM   Specimen: BLOOD  Result Value Ref Range Status   Specimen Description BLOOD RIGHT [facility]  Final   Special Requests   Final    BOTTLES DRAWN AEROBIC AND ANAEROBIC Blood Culture adequate volume   Culture   Final    NO GROWTH 2 DAYS Performed at [facility], [address] [place], [place] [address]    Report Status PENDING  Incomplete  Culture, blood (Routine x 2)     Status: None (Preliminary result)   Collection Time: 03/21/2020  1:41 PM   Specimen: BLOOD  Result Value Ref Range Status   Specimen Description BLOOD LEFT WRIST  Final   Special Requests   Final    BOTTLES DRAWN AEROBIC AND ANAEROBIC Blood Culture results may not be optimal due to an inadequate volume of blood received in culture bottles   Culture   Final    NO GROWTH 2 DAYS Performed at [facility], [address] [place], [place] [address]    Report Status PENDING  Incomplete  Resp Panel by RT-PCR (Flu A&B, Covid) Nasopharyngeal Swab     Status: None   Collection Time: 03/16/2020  4:03 PM   Specimen: Nasopharyngeal Swab; Nasopharyngeal(NP) swabs in vial transport medium  Result Value Ref Range Status   SARS Coronavirus 2 by RT PCR NEGATIVE NEGATIVE Final    Comment: (NOTE) SARS-CoV-2 target nucleic acids are NOT DETECTED.  The SARS-CoV-2 RNA is generally detectable in upper respiratory specimens during the acute phase of infection. The lowest concentration of SARS-CoV-2 viral copies this assay can detect is 138 copies/mL. A negative result does not preclude SARS-Cov-2 infection and should not be used as the sole basis for treatment or other patient management decisions. A negative result may occur with  improper specimen collection/handling, submission of specimen other than nasopharyngeal swab, presence of viral mutation(s) within the areas targeted by this assay, and inadequate number of viral copies(<138 copies/mL). A negative result must be combined with clinical observations, patient history, and epidemiological information. The expected result is Negative.  Fact Sheet for Patients:  EntrepreneurPulse.com.au  Fact Sheet for Healthcare Providers:  IncredibleEmployment.be  This test is no t yet  approved or cleared by the [place] FDA and  has been authorized for detection and/or diagnosis of SARS-CoV-2 by FDA under an Emergency Use Authorization (EUA). This EUA will remain  in effect (meaning this test can be used) for the duration of the COVID-19 declaration under Section 564(b)(1) of the Act, 21 U.S.C.section 360bbb-3(b)(1), unless the authorization is terminated  or revoked sooner.       Influenza A by PCR NEGATIVE NEGATIVE Final   Influenza B by PCR NEGATIVE NEGATIVE Final    Comment: (NOTE) The Xpert Xpress SARS-CoV-2/FLU/RSV plus assay is intended as an aid in the diagnosis of influenza from Nasopharyngeal swab specimens and should not be used as a sole basis for treatment. Nasal washings and aspirates are unacceptable for Xpert Xpress SARS-CoV-2/FLU/RSV testing.  Fact Sheet for Patients: EntrepreneurPulse.com.au  Fact Sheet for Healthcare Providers: IncredibleEmployment.be  This test is not yet approved or cleared by the [place] FDA  and has been authorized for detection and/or diagnosis of SARS-CoV-2 by FDA under an Emergency Use Authorization (EUA). This EUA will remain in effect (meaning this test can be used) for the duration of the COVID-19 declaration under Section 564(b)(1) of the Act, 21 U.S.C. section 360bbb-3(b)(1), unless the authorization is terminated or revoked.  Performed at [facility], [address] [place], [place] [address]   MRSA PCR Screening     Status: None   Collection Time: 04/01/2020  5:09 PM   Specimen: Nasopharyngeal  Result Value Ref Range Status   MRSA by PCR NEGATIVE NEGATIVE Final    Comment:        The GeneXpert MRSA Assay (FDA approved for NASAL specimens only), is one component of a comprehensive MRSA colonization surveillance program. It is not intended to diagnose MRSA infection nor to guide or monitor treatment for MRSA infections. Performed at [facility], [place], [place] [address]   C Difficile Quick Screen w PCR reflex     Status: Abnormal   Collection Time: 03/12/20  8:42 AM   Specimen: STOOL  Result Value Ref Range Status   C Diff antigen POSITIVE (A) NEGATIVE Final   C Diff toxin NEGATIVE NEGATIVE Final   C Diff interpretation Results are indeterminate. See PCR results.  Final    Comment: Performed at [facility], [address] [place], [place] [address]  C. Diff by PCR, Reflexed     Status: None   Collection Time: 03/12/20  8:42 AM  Result Value Ref Range Status   Toxigenic C. Difficile by PCR NEGATIVE NEGATIVE Final    Comment: Patient is colonized with non toxigenic C. difficile. May not need treatment unless significant symptoms are present. Performed at [facility], [place], [address] [place], [place] [address]           IMAGING    ECHOCARDIOGRAM COMPLETE  Result Date: 03/13/2020    ECHOCARDIOGRAM REPORT   Patient Name:   [name] Date of Exam: 03/13/2020 Medical Rec #:  [id number]          Height:       62.0 in Accession #:    [id number]         Weight:       148.1 lb Date of Birth:  Mar 26, 1940          BSA:          1.683 m Patient Age:    44 years           BP:           136/40 mmHg Patient Gender: F                  HR:           100 bpm. Exam Location:  [facility] Procedure: 2D Echo, Color Doppler and Cardiac Doppler Indications:     R06.03 Acute respiratory distress  History:         Patient has prior history of Echocardiogram examinations. CHF,                  Previous Myocardial Infarction and CAD, Stroke and PAD; Risk                  Factors:Diabetes, Hypertension and HCL.  Sonographer:     Charmayne Sheer RDCS (AE) Referring Phys:  [id number] Bradly Bienenstock Diagnosing Phys: Ida Rogue MD  Sonographer Comments: Echo performed with  patient supine and on artificial respirator and Technically challenging study due to limited acoustic windows. IMPRESSIONS  1. Left  ventricular ejection fraction, by estimation, is 40%. The left ventricle has mildly decreased function. unable to exclude regional wall motion abnormality Left ventricular diastolic parameters are consistent with Grade I diastolic dysfunction (impaired relaxation).  2. Right ventricular systolic function is normal. The right ventricular size is normal.  3. The mitral valve was not well visualized. Mild mitral valve regurgitation.  4. The inferior vena cava is dilated in size with <50% respiratory variability, suggesting right atrial pressure of 15 mmHg.  5. Challenging images FINDINGS  Left Ventricle: Left ventricular ejection fraction, by estimation, is 40 %. The left ventricle has mildly decreased function. The left ventricle has no regional wall motion abnormalities. The left ventricular internal cavity size was normal in size. There is no left ventricular hypertrophy. Left ventricular diastolic parameters are consistent with Grade I diastolic dysfunction (impaired relaxation). Right Ventricle: The right ventricular size is normal. No increase in right ventricular wall thickness. Right ventricular systolic function is normal. Left Atrium: Left atrial size was normal in size. Right Atrium: Right atrial size was normal in size. Pericardium: There is no evidence of pericardial effusion. Mitral Valve: The mitral valve was not well visualized. Mild mitral valve regurgitation. No evidence of mitral valve stenosis. MV peak gradient, 2.0 mmHg. The mean mitral valve gradient is 1.0 mmHg. Tricuspid Valve: The tricuspid valve is not well visualized. Tricuspid valve regurgitation is not demonstrated. No evidence of tricuspid stenosis. Aortic Valve: The aortic valve was not well visualized. Aortic valve regurgitation is not visualized. No aortic stenosis is present. Aortic valve mean gradient measures 2.0 mmHg. Aortic valve peak gradient measures 3.9 mmHg. Aortic valve area, by VTI measures 1.92 cm. Pulmonic Valve: The  pulmonic valve was normal in structure. Pulmonic valve regurgitation is not visualized. No evidence of pulmonic stenosis. Aorta: The aortic root is normal in size and structure. Venous: The inferior vena cava is dilated in size with less than 50% respiratory variability, suggesting right atrial pressure of 15 mmHg. IAS/Shunts: No atrial level shunt detected by color flow Doppler.  LEFT VENTRICLE PLAX 2D LVIDd:         4.20 cm  Diastology LVIDs:         3.20 cm  LV e' lateral:   7.72 cm/s LV PW:         0.90 cm  LV E/e' lateral: 8.2 LV IVS:        0.80 cm LVOT diam:     1.70 cm LV SV:         22 LV SV Index:   13 LVOT Area:     2.27 cm  RIGHT VENTRICLE RV Basal diam:  3.50 cm LEFT ATRIUM           Index       RIGHT ATRIUM          Index LA diam:      2.50 cm 1.49 cm/m  RA Area:     6.60 cm LA Vol (A4C): 26.3 ml 15.63 ml/m RA Volume:   9.71 ml  5.77 ml/m  AORTIC VALVE                   PULMONIC VALVE AV Area (Vmax):    1.61 cm    PV Vmax:       0.66 m/s AV Area (Vmean):   1.73 cm    PV Vmean:  44.400 cm/s AV Area (VTI):     1.92 cm    PV VTI:        0.094 m AV Vmax:           99.10 cm/s  PV Peak grad:  1.8 mmHg AV Vmean:          65.300 cm/s PV Mean grad:  1.0 mmHg AV VTI:            0.113 m AV Peak Grad:      3.9 mmHg AV Mean Grad:      2.0 mmHg LVOT Vmax:         70.50 cm/s LVOT Vmean:        49.900 cm/s LVOT VTI:          0.095 m LVOT/AV VTI ratio: 0.84  AORTA Ao Root diam: 2.80 cm MITRAL VALVE               TRICUSPID VALVE MV Area (PHT): 6.83 cm    TR Peak grad:   17.3 mmHg MV Area VTI:   1.29 cm    TR Vmax:        208.00 cm/s MV Peak grad:  2.0 mmHg MV Mean grad:  1.0 mmHg    SHUNTS MV Vmax:       0.71 m/s    Systemic VTI:  0.10 m MV Vmean:      47.5 cm/s   Systemic Diam: 1.70 cm MV Decel Time: 111 msec MV E velocity: 63.00 cm/s MV A velocity: 71.10 cm/s MV E/A ratio:  0.89 Ida Rogue MD Electronically signed by Ida Rogue MD Signature Date/Time: 03/13/2020/8:10:20 PM    Final       Nutrition Status: Nutrition Problem: Inadequate oral intake Etiology: inability to eat Signs/Symptoms: NPO status Interventions: Refer to RD note for recommendations  ECHO  is 40%. The left  ventricle has mildly decreased function. unable to exclude regional wall  motion abnormality Left ventricular diastolic parameters are consistent  with Grade I diastolic dysfunction  (impaired relaxation).   Indwelling Urinary Catheter continued, requirement due to   Reason to continue Indwelling Urinary Catheter strict Intake/Output monitoring for hemodynamic instability   Central Line/ continued, requirement due to  Reason to continue [place] of central venous pressure or other hemodynamic parameters and poor IV access   Ventilator continued, requirement due to severe respiratory failure   Ventilator Sedation RASS 0 to -2      ASSESSMENT AND PLAN SYNOPSIS   80 yo white female with acute and severe hypoxic respiratory failure with severe toxic metabolic encephalopathy from severe septic shock present on admission with severe acidosis due to UTI and aspiration pneumonia associated with severe hypercalcemia and severe acute renal failure with h/o multiple myeloma, 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  Severe ACUTE Hypoxic and Hypercapnic Respiratory Failure -continue Full MV support -continue Bronchodilator Therapy -Wean Fio2 and PEEP as tolerated -VAP/VENT bundle implementation  ACUTE SYSTOLIC/DIASTOLIC CARDIAC FAILURE- EF 40% -oxygen as needed   ACUTE KIDNEY INJURY/Renal Failure -continue Foley Catheter-assess need -Avoid nephrotoxic agents -Follow urine output, BMP -Ensure adequate renal perfusion, optimize oxygenation -Renal dose medications NOT A CANDIDATE FOR HD     NEUROLOGY Acute toxic metabolic encephalopathy, Off sedation Will need to consider MRI brain   SHOCK-SEPSIS -use vasopressors to keep MAP>65 -follow ABG  and LA -follow up cultures -emperic ABX -consider stress dose steroids   CARDIAC ICU monitoring  ID -continue IV abx as prescibed -follow  up cultures  GI GI PROPHYLAXIS as indicated   DIET-->TF's as tolerated Constipation protocol as indicated  ENDO - will use ICU hypoglycemic\Hyperglycemia protocol if indicated     ELECTROLYTES -follow labs as needed -replace as needed -pharmacy consultation and following   DVT/GI PRX ordered and assessed TRANSFUSIONS AS NEEDED MONITOR FSBS I Assessed the need for Labs I Assessed the need for Foley I Assessed the need for Central Venous Line Family Discussion when available I Assessed the need for Mobilization I made an Assessment of medications to be adjusted accordingly Safety Risk assessment completed   CASE DISCUSSED IN MULTIDISCIPLINARY ROUNDS WITH ICU TEAM  Critical Care Time devoted to patient care services described in this note is 56 minutes.   Overall, patient is critically ill, prognosis is guarded.  Patient with Multiorgan failure and at high risk for cardiac arrest and death.   Patient is DNR status   Corrin Parker, M.D.  [facility]  Medical Director [place] Director [facility] Cardio-Pulmonary Department

## 2020-03-14 NOTE — Progress Notes (Addendum)
Inpatient Diabetes Program Recommendations  AACE/ADA: New Consensus Statement on Inpatient Glycemic Control (2015)  Target Ranges:  Prepandial:   less than 140 mg/dL      Peak postprandial:   less than 180 mg/dL (1-2 hours)      Critically ill patients:  140 - 180 mg/dL   Results for AVA, TANGNEY (MRN [id number]) as of 03/14/2020 11:23  Ref. Range 03/13/2020 23:31 03/14/2020 03:07 03/14/2020 07:21 03/14/2020 11:06  Glucose-Capillary Latest Ref Range: 70 - 99 mg/dL 186 (H)   190 (H) 211 (H) 233 (H)    Home DM Meds: Humalog 0-12 units TID per SSI  Current Orders: Novolog 0-15 units Q4H   Remains on Vent.   Started TF yest around 1pm.    MD- Note CBGs on the rise since pt started on tube feeds yesterday.  Please consider adding low dose Novolog Tube Feed Coverage:  Novolog 3 units Q4 hours  HOLD if tube feeds HELD for any reason    --Will follow patient during hospitalization--  Wyn Quaker RN, MSN, CDE Diabetes Coordinator Inpatient Glycemic Control Team Team Pager: [phone] (8a-5p)

## 2020-03-14 NOTE — Progress Notes (Signed)
Pt transported to and from MRI with RN with out incident 

## 2020-03-14 NOTE — Progress Notes (Signed)
Pharmacy Antibiotic Note  [name] is a 80 y.o. female admitted on 03/20/2020. Patient with hypercalcemia, receiving treatment. Febrile, fever curve trending down. Pharmacy has been consulted for vancomycin dosing.  Plan: Change vancomycin to 1000 mg IV q48h for improving renal function. Goal AUC 400-550 Expected AUC: 527 SCr used: 2.01  Patient also on cefepime 2 g IV q24h + Flagyl 500 mg IV q8h   Height: 5' 2.01" (157.5 cm) Weight: 67.4 kg (148 lb 9.4 oz) IBW/kg (Calculated) : 50.12  Temp (24hrs), Avg:99 F (37.2 C), Min:98.78 F (37.1 C), Max:99.5 F (37.5 C)  Recent Labs  Lab 04/06/2020 1342 03/25/2020 1343 03/29/2020 1601 03/30/2020 2139 03/12/20 0441 03/13/20 1122 03/13/20 1246 03/13/20 2340 03/14/20 0530  WBC 5.6  --   --   --  2.0* 3.2*  --   --  2.6*  CREATININE 3.01*  --   --   --  2.46* 2.41*  --  2.15* 2.01*  LATICACIDVEN  --  8.6* 5.3* 6.0* 6.6*  --   --  2.6*  --   VANCORANDOM  --   --   --   --   --   --  12  --   --     Estimated Creatinine Clearance: 20.8 mL/min (A) (by C-G formula based on SCr of 2.01 mg/dL (H)).    Allergies  Allergen Reactions  . Benzodiazepines Anxiety and Other (See Comments)    Confusion, also  . Iodinated Diagnostic Agents Swelling    "Head swells"  . Other Swelling    Smoke--Head swelling  . Statins Swelling    "Head swells"  . Ticagrelor Shortness Of Breath  . Metoclopramide Other (See Comments)    Causes tremors and insomnia  . Amoxicillin Hives and Rash  . Codeine Hives and Rash  . Duloxetine Nausea Only  . Sulfa Antibiotics Hives and Rash    Antimicrobials this admission: Vancomycin 3/1 >> Cefepime 3/1 x 1, 3/3 >> Metronidazole 3/1 x 1, 3/3 >>  Microbiology results: 3/1 BCx: NG  3/1 MRSA PCR: negative  Thank you for allowing pharmacy to be a part of this patient's care.  [name], PharmD 03/14/2020 2:04 PM

## 2020-03-14 NOTE — Progress Notes (Signed)
GOALS OF CARE DISCUSSION  The Clinical status was relayed to family in detail. Son at bedside  I have updated family-MRI has severe brain damage  Updated and notified of patients medical condition.  Patient remains unresponsive and will not open eyes to command.    Patient is having a weak cough and struggling to remove secretions.   Patient with increased WOB and using accessory muscles to breathe Explained to family course of therapy and the modalities     Patient with Progressive multiorgan failure with a very high probablity of a very minimal chance of meaningful recovery despite all aggressive and optimal medical therapy. Patient is in the Dying  Process associated with Suffering.  Family understands the situation.  They have consented and agreed to DNR/DNI and would like to proceed with Comfort care measures.  Family are satisfied with Plan of action and management. All questions answered  Additional CC time 32 mins   Kurian Patricia Pesa, M.D.  [facility]  Medical Director [place] Director [facility] Cardio-Pulmonary Department

## 2020-03-14 NOTE — Progress Notes (Signed)
Pt extubated to comfort care. Morphine gtt and push administered prior. Son at bedside. Levophed and tube feed stopped. Versed administered. Pt appears comfortable at this time. Will continue to monitor.

## 2020-03-14 NOTE — Progress Notes (Signed)
PHARMACY CONSULT NOTE - FOLLOW UP  Pharmacy Consult for Electrolyte Monitoring and Replacement   Recent Labs: Potassium (mmol/L)  Date Value  03/13/2020 3.6   Magnesium (mg/dL)  Date Value  03/13/2020 2.3   Calcium (mg/dL)  Date Value  03/13/2020 10.5 (H)   Albumin (g/dL)  Date Value  03/19/2020 2.9 (L)  06/03/2016 4.3   Phosphorus (mg/dL)  Date Value  03/13/2020 3.8   Sodium (mmol/L)  Date Value  03/13/2020 137  06/03/2016 138   Assessment: 79 year old female admitted with AMS. Patient with significant hypercalcemia and hypokalemia. Patient with h/o multiple myeloma. Pharmacy to manage electrolytes.  Goal of Therapy:  Electrolytes WNL, K >/= 4 while in ICU  Plan:  Potassium runs 10meq IV q1h x 3 bags Recheck potassium today at 0500  ,  A ,PharmD Clinical Pharmacist 03/14/2020 1:05 AM  

## 2020-03-15 DIAGNOSIS — N179 Acute kidney failure, unspecified: Secondary | ICD-10-CM

## 2020-03-15 DIAGNOSIS — A419 Sepsis, unspecified organism: Secondary | ICD-10-CM

## 2020-03-16 LAB — CULTURE, BLOOD (ROUTINE X 2)
Culture: NO GROWTH
Culture: NO GROWTH
Special Requests: ADEQUATE

## 2020-04-11 NOTE — Significant Event (Signed)
Pt passed away this morning at 04:50am after initiating comfort care on 03/14/20 06:15pm. Son, Dean Escue was at bedside, bedside RN and charge RN pronounced death per order.

## 2020-04-11 NOTE — Death Summary Note (Signed)
DEATH SUMMARY   Patient Details  Name: [name] MRN: [id number] DOB: 03-31-1940  Admission/Discharge Information   Admit Date:  03/18/2020  Date of Death: Date of Death: 03/22/2020  Time of Death: Time of Death: 0450  Length of Stay: 4  Referring Physician: Lynnell Jude, MD   Reason(s) for Hospitalization  Patient found unresponsive at skilled nursing facility brought to the ED agonally breathing found to be in septic shock emergently intubated and mechanically ventilated. Diagnoses  Preliminary cause of death:  Secondary Diagnoses (including complications and co-morbidities):  Principal Problem:   Septic shock ([place]) Active Problems:   Multiple myeloma ([facility])   Respiratory failure ([place])   Palliative care encounter   Glasgow coma scale total score 3-8 ([place])   Hypercalcemia   Acute kidney injury superimposed on CKD [facility])   Brief Hospital Course (including significant findings, care, treatment, and services provided and events leading to death)  [name] is a 80 y.o. 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  Upon arrival patient was unresponsive with agonal respirations was confirmed as a full code and emergently intubated for airway protection requiring mechanical ventilation.  This was the patient's third admission in 2020/02/29 with altered mental status due to recurring hypercalcemia thought to be secondary to multiple myeloma and dehydration.  Upon admission the patient was found to be febrile with a temperature of 104.5 and neutropenia with hypotension in septic shock with severe multiorgan failure as well as severe hypercalcemia with a calcium greater than 15.  The patient required 8 L of IV fluid resuscitation, mechanical  ventilator support, & vasopressors.  The patient was started on broad-spectrum antibiotics and pan cultured.  The patient was tested for C. difficile due to diarrhea, and found to be indeterminant at first but ultimately negative.  Patient continued to be unresponsive on the ventilator with no sedation required. MRI of the brain showed abnormal signal extensively involving bilateral cerebral hemispheres and the cerebellum.  This is most consistent with acute and subacute areas of infarction with many of the in involved areas involving watershed territories.  Oncology and nephrology were consulted in her care providing input and guidance with interventions.  Palliative care was ultimately consulted due to poor prognosis.  After the MRI results were shared with family the decision was made to give the patient a few days and see if there was a turnaround with antibiotics and ventilator support.  Ultimately on 21-Mar-2020 the decision was made to transition to comfort measures only.  Patient extubated at 18:25 on 21-Mar-2020, time of death 04:50 on 2020-03-22.  Pertinent Labs and Studies  Significant Diagnostic Studies CT Head Wo Contrast  Result Date: 2020-03-18 CLINICAL DATA:  Mental status change. EXAM: CT HEAD WITHOUT CONTRAST TECHNIQUE: Contiguous axial images were obtained from the base of the skull through the vertex without intravenous contrast. COMPARISON:  CT head, 02/02/2020 FINDINGS: Brain: No evidence of acute infarction, hemorrhage, hydrocephalus, extra-axial collection or mass lesion/mass effect. Generalized cerebral atrophy with ex vacuo ventricular dilation, not substantially changed from priors. Vascular: Calcific atherosclerosis. No hyperdense vessel identified. Skull: No acute fracture. Sinuses/Orbits: Visualized sinuses are clear.  Unremarkable orbits. Other: No mastoid effusions. IMPRESSION: 1. No evidence of acute intracranial abnormality. 2. Similar chronic microvascular ischemic disease and  generalized cerebral volume loss. Electronically Signed   By: Margaretha Sheffield MD   On: 03/18/2020 15:12   MR BRAIN WO CONTRAST  Result Date: 03/14/2020 CLINICAL DATA:  Found unresponsive EXAM: MRI HEAD WITHOUT CONTRAST TECHNIQUE: Multiplanar, multiecho pulse sequences of the brain and surrounding structures were obtained without intravenous contrast. COMPARISON:  Correlation made with CT head 04/05/2020 FINDINGS: Brain: There is diffusion hyperintensity with variably ADC signal involving cortex and subcortical white matter of bilateral cerebral hemispheres primarily within the frontal, parietal, and occipital lobes with some temporal involvement. There is also involvement of the cerebellum. Deep gray nuclei are spared. Some corresponding areas of T1 shortening particularly in the parieto-occipital regions likely reflect laminar necrosis. No evidence of hemorrhage on susceptibility weighted imaging. No significant mass effect.  No hydrocephalus. Vascular: Major vessel flow voids at the skull base are preserved. Skull and upper cervical spine: Normal marrow signal is preserved. Sinuses/Orbits: Paranasal sinuses are aerated. Orbits are unremarkable. Other: Sella is unremarkable.  Patchy mastoid fluid opacification. IMPRESSION: Abnormal signal extensively involving bilateral cerebral hemispheres and the cerebellum. This is most consistent with acute and subacute areas of infarction with many of the involved areas involving watershed territories. Electronically Signed   By: Macy Mis M.D.   On: 03/14/2020 15:10   DG Chest Port 1 View  Result Date: 03/12/2020 CLINICAL DATA:  Respiratory failure EXAM: PORTABLE CHEST 1 VIEW COMPARISON:  04/09/2020 FINDINGS: Endotracheal tube is seen 4 cm above the carina. Nasogastric tube extends into the gastric fundus. Developing bibasilar opacities, right greater than left, in keeping with atelectasis or focal infiltrate. No pneumothorax or pleural effusion. Coronary artery  bypass grafting and coronary artery stenting has been performed. The pulmonary vascularity is normal. IMPRESSION: Progressive bibasilar atelectasis or infiltrate. Stable support tubes. Electronically Signed   By: Fidela Salisbury MD   On: 03/12/2020 04:42   DG Chest Portable 1 View  Result Date: 03/20/2020 CLINICAL DATA:  Intubation. EXAM: PORTABLE CHEST 1 VIEW COMPARISON:  CT 02/02/2020.  Chest x-ray 02/02/2020. FINDINGS: Endotracheal tube and NG tube in stable position. Prior CABG. Heart size normal. Mild bibasilar atelectasis. Mild bibasilar infiltrates cannot be excluded. No pleural effusion or pneumothorax. Old left rib fractures noted. IMPRESSION: 1. Endotracheal tube and NG tube in stable position. 2. Mild bibasilar atelectasis. Mild bibasilar infiltrates cannot be excluded. Electronically Signed   By: Marcello Moores  Register   On: 03/29/2020 13:06   ECHOCARDIOGRAM COMPLETE  Result Date: 03/13/2020    ECHOCARDIOGRAM REPORT   Patient Name:   [name] Date of Exam: 03/13/2020 Medical Rec #:  [id number]          Height:       62.0 in Accession #:    [id number]         Weight:       148.1 lb Date of Birth:  30-Apr-1940          BSA:          1.683 m Patient Age:    80 years           BP:           136/40 mmHg Patient Gender: F                  HR:           100 bpm. Exam Location:  [facility] Procedure: 2D Echo, Color Doppler and Cardiac Doppler Indications:     R06.03 Acute respiratory distress  History:  Patient has prior history of Echocardiogram examinations. CHF,                  Previous Myocardial Infarction and CAD, Stroke and PAD; Risk                  Factors:Diabetes, Hypertension and HCL.  Sonographer:     Charmayne Sheer RDCS (AE) Referring Phys:  [id number] Bradly Bienenstock Diagnosing Phys: Ida Rogue MD  Sonographer Comments: Echo performed with patient supine and on artificial respirator and Technically challenging study due to limited acoustic windows. IMPRESSIONS  1. Left ventricular ejection  fraction, by estimation, is 40%. The left ventricle has mildly decreased function. unable to exclude regional wall motion abnormality Left ventricular diastolic parameters are consistent with Grade I diastolic dysfunction (impaired relaxation).  2. Right ventricular systolic function is normal. The right ventricular size is normal.  3. The mitral valve was not well visualized. Mild mitral valve regurgitation.  4. The inferior vena cava is dilated in size with <50% respiratory variability, suggesting right atrial pressure of 15 mmHg.  5. Challenging images FINDINGS  Left Ventricle: Left ventricular ejection fraction, by estimation, is 40 %. The left ventricle has mildly decreased function. The left ventricle has no regional wall motion abnormalities. The left ventricular internal cavity size was normal in size. There is no left ventricular hypertrophy. Left ventricular diastolic parameters are consistent with Grade I diastolic dysfunction (impaired relaxation). Right Ventricle: The right ventricular size is normal. No increase in right ventricular wall thickness. Right ventricular systolic function is normal. Left Atrium: Left atrial size was normal in size. Right Atrium: Right atrial size was normal in size. Pericardium: There is no evidence of pericardial effusion. Mitral Valve: The mitral valve was not well visualized. Mild mitral valve regurgitation. No evidence of mitral valve stenosis. MV peak gradient, 2.0 mmHg. The mean mitral valve gradient is 1.0 mmHg. Tricuspid Valve: The tricuspid valve is not well visualized. Tricuspid valve regurgitation is not demonstrated. No evidence of tricuspid stenosis. Aortic Valve: The aortic valve was not well visualized. Aortic valve regurgitation is not visualized. No aortic stenosis is present. Aortic valve mean gradient measures 2.0 mmHg. Aortic valve peak gradient measures 3.9 mmHg. Aortic valve area, by VTI measures 1.92 cm. Pulmonic Valve: The pulmonic valve was normal  in structure. Pulmonic valve regurgitation is not visualized. No evidence of pulmonic stenosis. Aorta: The aortic root is normal in size and structure. Venous: The inferior vena cava is dilated in size with less than 50% respiratory variability, suggesting right atrial pressure of 15 mmHg. IAS/Shunts: No atrial level shunt detected by color flow Doppler.  LEFT VENTRICLE PLAX 2D LVIDd:         4.20 cm  Diastology LVIDs:         3.20 cm  LV e' lateral:   7.72 cm/s LV PW:         0.90 cm  LV E/e' lateral: 8.2 LV IVS:        0.80 cm LVOT diam:     1.70 cm LV SV:         22 LV SV Index:   13 LVOT Area:     2.27 cm  RIGHT VENTRICLE RV Basal diam:  3.50 cm LEFT ATRIUM           Index       RIGHT ATRIUM          Index LA diam:      2.50 cm 1.49 cm/m  RA Area:     6.60 cm LA Vol (A4C): 26.3 ml 15.63 ml/m RA Volume:   9.71 ml  5.77 ml/m  AORTIC VALVE                   PULMONIC VALVE AV Area (Vmax):    1.61 cm    PV Vmax:       0.66 m/s AV Area (Vmean):   1.73 cm    PV Vmean:      44.400 cm/s AV Area (VTI):     1.92 cm    PV VTI:        0.094 m AV Vmax:           99.10 cm/s  PV Peak grad:  1.8 mmHg AV Vmean:          65.300 cm/s PV Mean grad:  1.0 mmHg AV VTI:            0.113 m AV Peak Grad:      3.9 mmHg AV Mean Grad:      2.0 mmHg LVOT Vmax:         70.50 cm/s LVOT Vmean:        49.900 cm/s LVOT VTI:          0.095 m LVOT/AV VTI ratio: 0.84  AORTA Ao Root diam: 2.80 cm MITRAL VALVE               TRICUSPID VALVE MV Area (PHT): 6.83 cm    TR Peak grad:   17.3 mmHg MV Area VTI:   1.29 cm    TR Vmax:        208.00 cm/s MV Peak grad:  2.0 mmHg MV Mean grad:  1.0 mmHg    SHUNTS MV Vmax:       0.71 m/s    Systemic VTI:  0.10 m MV Vmean:      47.5 cm/s   Systemic Diam: 1.70 cm MV Decel Time: 111 msec MV E velocity: 63.00 cm/s MV A velocity: 71.10 cm/s MV E/A ratio:  0.89 Ida Rogue MD Electronically signed by Ida Rogue MD Signature Date/Time: 03/13/2020/8:10:20 PM    Final    [place] EKG SITE RITE  Result Date:  03/12/2020 If Site Rite image not attached, placement could not be confirmed due to current cardiac rhythm.   Microbiology Recent Results (from the past 240 hour(s))  Culture, blood (Routine x 2)     Status: None (Preliminary result)   Collection Time: 03/16/2020 12:02 PM   Specimen: BLOOD  Result Value Ref Range Status   Specimen Description BLOOD RIGHT [facility]  Final   Special Requests   Final    BOTTLES DRAWN AEROBIC AND ANAEROBIC Blood Culture adequate volume   Culture   Final    NO GROWTH 3 DAYS Performed at [facility], [address], [place], [place] [address]    Report Status PENDING  Incomplete  Culture, blood (Routine x 2)     Status: None (Preliminary result)   Collection Time: 03/24/2020  1:41 PM   Specimen: BLOOD  Result Value Ref Range Status   Specimen Description BLOOD LEFT WRIST  Final   Special Requests   Final    BOTTLES DRAWN AEROBIC AND ANAEROBIC Blood Culture results may not be optimal due to an inadequate volume of blood received in culture bottles   Culture   Final    NO GROWTH 3 DAYS Performed at [facility], [address] [place], [place]  [address]    Report Status PENDING  Incomplete  Resp Panel by RT-PCR (Flu A&B, Covid) Nasopharyngeal Swab     Status: None   Collection Time: 03/19/2020  4:03 PM   Specimen: Nasopharyngeal Swab; Nasopharyngeal(NP) swabs in vial transport medium  Result Value Ref Range Status   SARS Coronavirus 2 by RT PCR NEGATIVE NEGATIVE Final    Comment: (NOTE) SARS-CoV-2 target nucleic acids are NOT DETECTED.  The SARS-CoV-2 RNA is generally detectable in upper respiratory specimens during the acute phase of infection. The lowest concentration of SARS-CoV-2 viral copies this assay can detect is 138 copies/mL. A negative result does not preclude SARS-Cov-2 infection and should not be used as the sole basis for treatment or other patient management decisions. A negative result may occur with  improper specimen  collection/handling, submission of specimen other than nasopharyngeal swab, presence of viral mutation(s) within the areas targeted by this assay, and inadequate number of viral copies(<138 copies/mL). A negative result must be combined with clinical observations, patient history, and epidemiological information. The expected result is Negative.  Fact Sheet for Patients:  EntrepreneurPulse.com.au  Fact Sheet for Healthcare Providers:  IncredibleEmployment.be  This test is no t yet approved or cleared by the [place] FDA and  has been authorized for detection and/or diagnosis of SARS-CoV-2 by FDA under an Emergency Use Authorization (EUA). This EUA will remain  in effect (meaning this test can be used) for the duration of the COVID-19 declaration under Section 564(b)(1) of the Act, 21 U.S.C.section 360bbb-3(b)(1), unless the authorization is terminated  or revoked sooner.       Influenza A by PCR NEGATIVE NEGATIVE Final   Influenza B by PCR NEGATIVE NEGATIVE Final    Comment: (NOTE) The Xpert Xpress SARS-CoV-2/FLU/RSV plus assay is intended as an aid in the diagnosis of influenza from Nasopharyngeal swab specimens and should not be used as a sole basis for treatment. Nasal washings and aspirates are unacceptable for Xpert Xpress SARS-CoV-2/FLU/RSV testing.  Fact Sheet for Patients: EntrepreneurPulse.com.au  Fact Sheet for Healthcare Providers: IncredibleEmployment.be  This test is not yet approved or cleared by the [place] FDA and has been authorized for detection and/or diagnosis of SARS-CoV-2 by FDA under an Emergency Use Authorization (EUA). This EUA will remain in effect (meaning this test can be used) for the duration of the COVID-19 declaration under Section 564(b)(1) of the Act, 21 U.S.C. section 360bbb-3(b)(1), unless the authorization is terminated or revoked.  Performed at [facility], [address] [place], [place] [address]   MRSA PCR Screening     Status: None   Collection Time: 03/24/2020  5:09 PM   Specimen: Nasopharyngeal  Result Value Ref Range Status   MRSA by PCR NEGATIVE NEGATIVE Final    Comment:        The GeneXpert MRSA Assay (FDA approved for NASAL specimens only), is one component of a comprehensive MRSA colonization surveillance program. It is not intended to diagnose MRSA infection nor to guide or monitor treatment for MRSA infections. Performed at [facility], [place], [place] [address]   C Difficile Quick Screen w PCR reflex     Status: Abnormal   Collection Time: 03/12/20  8:42 AM   Specimen: STOOL  Result Value Ref Range Status   C Diff antigen POSITIVE (A) NEGATIVE Final   C Diff toxin NEGATIVE NEGATIVE Final   C Diff interpretation Results are indeterminate. See PCR results.  Final    Comment: Performed at  [facility], [address] [place], [place] [address]  C. Diff by PCR, Reflexed     Status: None   Collection Time: 03/12/20  8:42 AM  Result Value Ref Range Status   Toxigenic C. Difficile by PCR NEGATIVE NEGATIVE Final    Comment: Patient is colonized with non toxigenic C. difficile. May not need treatment unless significant symptoms are present. Performed at [facility], [address] [place],  [address]     Lab Basic Metabolic Panel: Recent Labs  Lab 03/28/2020 1342 03/22/2020 2139 03/12/20 0441 03/13/20 1122 03/13/20 2340 03/14/20 0530  NA 147*  --  148* 140 137 138  K 2.8* 2.4* 3.4* 4.1 3.6 3.8  CL 113*  --  117* 111 109 111  CO2 16*  --  18* 17* 18* 21*  GLUCOSE 259*  --  120* 139* 257* 288*  BUN 56*  --  53* 62* 63* 62*  CREATININE 3.01*  --  2.46* 2.41* 2.15* 2.01*  CALCIUM >15.0*  --  14.1* 11.8* 10.5* 10.2  MG  --  1.2* 3.1* 2.3  --  2.0  PHOS  --  3.5 2.3* 3.8  --  2.6   Liver Function Tests: Recent Labs  Lab 03/20/2020 1342  AST  156*  ALT 64*  ALKPHOS 61  BILITOT 1.2  PROT 7.1  ALBUMIN 2.9*   No results for input(s): LIPASE, AMYLASE in the last 168 hours. No results for input(s): AMMONIA in the last 168 hours. CBC: Recent Labs  Lab 03/16/2020 1342 03/12/20 0441 03/13/20 1122 03/14/20 0530  WBC 5.6 2.0* 3.2* 2.6*  NEUTROABS 0.7*  --   --   --   HGB 11.8* 11.1* 8.4* 7.4*  HCT 40.2 34.6* 25.7* 22.7*  MCV 97.6 92.5 88.6 88.7  PLT 127* PLATELET CLUMPS NOTED ON SMEAR, UNABLE TO ESTIMATE 70* 49*   Cardiac Enzymes: No results for input(s): CKTOTAL, CKMB, CKMBINDEX, TROPONINI in the last 168 hours. Sepsis Labs: Recent Labs  Lab 04/09/2020 1342 04/01/2020 1343 04/06/2020 1601 03/31/2020 2139 03/12/20 0441 03/13/20 1122 03/13/20 2340 03/14/20 0530  WBC 5.6  --   --   --  2.0* 3.2*  --  2.6*  LATICACIDVEN  --    < > 5.3* 6.0* 6.6*  --  2.6*  --    < > = values in this interval not displayed.    Procedures/Operations  03/24/2020 ETT >> 03/14/20 03/13/20 PICC   Britton L Rust-Chester Apr 10, 2020, 5:27 AM  Domingo Pulse Rust-Chester, AGACNP-BC Acute Care Nurse Practitioner [facility]   [phone] / [phone] Please see Amion for pager details.

## 2020-04-11 DEATH — deceased
